# Patient Record
Sex: Female | Born: 1945 | Race: Black or African American | Hispanic: No | Smoking: Former smoker
Health system: Southern US, Community
[De-identification: ages and names within clinical notes are randomized; demographics above are authoritative.]

## PROBLEM LIST (undated history)

## (undated) ENCOUNTER — Emergency Department (HOSPITAL_COMMUNITY): Payer: Medicare HMO

## (undated) DIAGNOSIS — Z862 Personal history of diseases of the blood and blood-forming organs and certain disorders involving the immune mechanism: Secondary | ICD-10-CM

## (undated) DIAGNOSIS — N189 Chronic kidney disease, unspecified: Secondary | ICD-10-CM

## (undated) DIAGNOSIS — D649 Anemia, unspecified: Secondary | ICD-10-CM

## (undated) DIAGNOSIS — Z531 Procedure and treatment not carried out because of patient's decision for reasons of belief and group pressure: Secondary | ICD-10-CM

## (undated) DIAGNOSIS — Z8739 Personal history of other diseases of the musculoskeletal system and connective tissue: Secondary | ICD-10-CM

## (undated) DIAGNOSIS — N186 End stage renal disease: Secondary | ICD-10-CM

## (undated) DIAGNOSIS — F32A Depression, unspecified: Secondary | ICD-10-CM

## (undated) DIAGNOSIS — M4310 Spondylolisthesis, site unspecified: Secondary | ICD-10-CM

## (undated) DIAGNOSIS — IMO0001 Reserved for inherently not codable concepts without codable children: Secondary | ICD-10-CM

## (undated) DIAGNOSIS — J189 Pneumonia, unspecified organism: Secondary | ICD-10-CM

## (undated) DIAGNOSIS — E119 Type 2 diabetes mellitus without complications: Secondary | ICD-10-CM

## (undated) DIAGNOSIS — I10 Essential (primary) hypertension: Secondary | ICD-10-CM

## (undated) DIAGNOSIS — N185 Chronic kidney disease, stage 5: Secondary | ICD-10-CM

## (undated) DIAGNOSIS — M1990 Unspecified osteoarthritis, unspecified site: Secondary | ICD-10-CM

## (undated) DIAGNOSIS — R0600 Dyspnea, unspecified: Secondary | ICD-10-CM

## (undated) DIAGNOSIS — N2581 Secondary hyperparathyroidism of renal origin: Secondary | ICD-10-CM

## (undated) DIAGNOSIS — F329 Major depressive disorder, single episode, unspecified: Secondary | ICD-10-CM

## (undated) DIAGNOSIS — M109 Gout, unspecified: Secondary | ICD-10-CM

## (undated) HISTORY — PX: COLONOSCOPY: SHX174

## (undated) HISTORY — PX: HIP SURGERY: SHX245

## (undated) HISTORY — PX: FRACTURE SURGERY: SHX138

## (undated) HISTORY — PX: COLON SURGERY: SHX602

## (undated) NOTE — *Deleted (*Deleted)
Patient up to bedside commode w / 1 assist . Complaint of contipation . Given prn senna and place dbaack to bed , bed in lowest postion, call bell within reach bed alarm on.

---
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
## 2014-04-19 DIAGNOSIS — D631 Anemia in chronic kidney disease: Secondary | ICD-10-CM | POA: Diagnosis not present

## 2014-04-19 DIAGNOSIS — E1122 Type 2 diabetes mellitus with diabetic chronic kidney disease: Secondary | ICD-10-CM | POA: Diagnosis not present

## 2014-04-19 DIAGNOSIS — I129 Hypertensive chronic kidney disease with stage 1 through stage 4 chronic kidney disease, or unspecified chronic kidney disease: Secondary | ICD-10-CM | POA: Diagnosis not present

## 2014-04-19 DIAGNOSIS — D869 Sarcoidosis, unspecified: Secondary | ICD-10-CM | POA: Diagnosis not present

## 2014-04-19 DIAGNOSIS — I951 Orthostatic hypotension: Secondary | ICD-10-CM | POA: Diagnosis not present

## 2014-04-19 DIAGNOSIS — N184 Chronic kidney disease, stage 4 (severe): Secondary | ICD-10-CM | POA: Diagnosis not present

## 2014-04-19 DIAGNOSIS — N179 Acute kidney failure, unspecified: Secondary | ICD-10-CM | POA: Diagnosis not present

## 2014-04-19 DIAGNOSIS — I10 Essential (primary) hypertension: Secondary | ICD-10-CM | POA: Diagnosis not present

## 2014-04-19 DIAGNOSIS — E782 Mixed hyperlipidemia: Secondary | ICD-10-CM | POA: Diagnosis not present

## 2014-04-19 DIAGNOSIS — M109 Gout, unspecified: Secondary | ICD-10-CM | POA: Diagnosis not present

## 2014-09-12 DIAGNOSIS — G8929 Other chronic pain: Secondary | ICD-10-CM | POA: Diagnosis not present

## 2014-09-12 DIAGNOSIS — I10 Essential (primary) hypertension: Secondary | ICD-10-CM | POA: Diagnosis not present

## 2014-09-12 DIAGNOSIS — Z1389 Encounter for screening for other disorder: Secondary | ICD-10-CM | POA: Diagnosis not present

## 2014-09-12 DIAGNOSIS — M549 Dorsalgia, unspecified: Secondary | ICD-10-CM | POA: Diagnosis not present

## 2014-09-12 DIAGNOSIS — Z0000 Encounter for general adult medical examination without abnormal findings: Secondary | ICD-10-CM | POA: Diagnosis not present

## 2014-10-10 DIAGNOSIS — F1120 Opioid dependence, uncomplicated: Secondary | ICD-10-CM | POA: Diagnosis not present

## 2014-10-10 DIAGNOSIS — M545 Low back pain: Secondary | ICD-10-CM | POA: Diagnosis not present

## 2014-10-10 DIAGNOSIS — G894 Chronic pain syndrome: Secondary | ICD-10-CM | POA: Diagnosis not present

## 2014-10-10 DIAGNOSIS — M47816 Spondylosis without myelopathy or radiculopathy, lumbar region: Secondary | ICD-10-CM | POA: Diagnosis not present

## 2014-10-10 DIAGNOSIS — M533 Sacrococcygeal disorders, not elsewhere classified: Secondary | ICD-10-CM | POA: Diagnosis not present

## 2014-10-17 DIAGNOSIS — E780 Pure hypercholesterolemia: Secondary | ICD-10-CM | POA: Diagnosis not present

## 2014-10-17 DIAGNOSIS — Z79899 Other long term (current) drug therapy: Secondary | ICD-10-CM | POA: Diagnosis not present

## 2014-10-24 DIAGNOSIS — Z9181 History of falling: Secondary | ICD-10-CM | POA: Diagnosis not present

## 2014-10-24 DIAGNOSIS — E780 Pure hypercholesterolemia: Secondary | ICD-10-CM | POA: Diagnosis not present

## 2014-10-24 DIAGNOSIS — E1143 Type 2 diabetes mellitus with diabetic autonomic (poly)neuropathy: Secondary | ICD-10-CM | POA: Diagnosis not present

## 2014-10-24 DIAGNOSIS — N189 Chronic kidney disease, unspecified: Secondary | ICD-10-CM | POA: Diagnosis not present

## 2014-10-25 DIAGNOSIS — G894 Chronic pain syndrome: Secondary | ICD-10-CM | POA: Diagnosis not present

## 2014-10-25 DIAGNOSIS — M47816 Spondylosis without myelopathy or radiculopathy, lumbar region: Secondary | ICD-10-CM | POA: Diagnosis not present

## 2014-10-25 DIAGNOSIS — F1120 Opioid dependence, uncomplicated: Secondary | ICD-10-CM | POA: Diagnosis not present

## 2014-10-25 DIAGNOSIS — M533 Sacrococcygeal disorders, not elsewhere classified: Secondary | ICD-10-CM | POA: Diagnosis not present

## 2014-11-15 DIAGNOSIS — M461 Sacroiliitis, not elsewhere classified: Secondary | ICD-10-CM | POA: Diagnosis not present

## 2014-11-15 DIAGNOSIS — M47816 Spondylosis without myelopathy or radiculopathy, lumbar region: Secondary | ICD-10-CM | POA: Diagnosis not present

## 2014-11-15 DIAGNOSIS — M533 Sacrococcygeal disorders, not elsewhere classified: Secondary | ICD-10-CM | POA: Diagnosis not present

## 2014-11-15 DIAGNOSIS — G894 Chronic pain syndrome: Secondary | ICD-10-CM | POA: Diagnosis not present

## 2014-11-15 DIAGNOSIS — F1120 Opioid dependence, uncomplicated: Secondary | ICD-10-CM | POA: Diagnosis not present

## 2014-12-13 DIAGNOSIS — F1120 Opioid dependence, uncomplicated: Secondary | ICD-10-CM | POA: Diagnosis not present

## 2014-12-13 DIAGNOSIS — M533 Sacrococcygeal disorders, not elsewhere classified: Secondary | ICD-10-CM | POA: Diagnosis not present

## 2014-12-13 DIAGNOSIS — M47816 Spondylosis without myelopathy or radiculopathy, lumbar region: Secondary | ICD-10-CM | POA: Diagnosis not present

## 2014-12-13 DIAGNOSIS — G894 Chronic pain syndrome: Secondary | ICD-10-CM | POA: Diagnosis not present

## 2015-01-08 DIAGNOSIS — H26493 Other secondary cataract, bilateral: Secondary | ICD-10-CM | POA: Diagnosis not present

## 2015-01-08 DIAGNOSIS — E119 Type 2 diabetes mellitus without complications: Secondary | ICD-10-CM | POA: Diagnosis not present

## 2015-01-08 DIAGNOSIS — H524 Presbyopia: Secondary | ICD-10-CM | POA: Diagnosis not present

## 2015-01-16 DIAGNOSIS — M546 Pain in thoracic spine: Secondary | ICD-10-CM | POA: Diagnosis not present

## 2015-01-16 DIAGNOSIS — I10 Essential (primary) hypertension: Secondary | ICD-10-CM | POA: Diagnosis not present

## 2015-01-16 DIAGNOSIS — G8929 Other chronic pain: Secondary | ICD-10-CM | POA: Diagnosis not present

## 2015-01-16 DIAGNOSIS — E1165 Type 2 diabetes mellitus with hyperglycemia: Secondary | ICD-10-CM | POA: Diagnosis not present

## 2015-04-04 DIAGNOSIS — E1165 Type 2 diabetes mellitus with hyperglycemia: Secondary | ICD-10-CM | POA: Diagnosis not present

## 2015-04-09 DIAGNOSIS — E119 Type 2 diabetes mellitus without complications: Secondary | ICD-10-CM | POA: Diagnosis not present

## 2015-04-09 DIAGNOSIS — E780 Pure hypercholesterolemia: Secondary | ICD-10-CM | POA: Diagnosis not present

## 2015-04-09 DIAGNOSIS — I10 Essential (primary) hypertension: Secondary | ICD-10-CM | POA: Diagnosis not present

## 2015-04-09 DIAGNOSIS — N189 Chronic kidney disease, unspecified: Secondary | ICD-10-CM | POA: Diagnosis not present

## 2015-04-16 DIAGNOSIS — N390 Urinary tract infection, site not specified: Secondary | ICD-10-CM | POA: Diagnosis not present

## 2015-04-17 DIAGNOSIS — N183 Chronic kidney disease, stage 3 (moderate): Secondary | ICD-10-CM | POA: Diagnosis not present

## 2015-04-17 DIAGNOSIS — N184 Chronic kidney disease, stage 4 (severe): Secondary | ICD-10-CM | POA: Diagnosis not present

## 2015-04-17 DIAGNOSIS — N2581 Secondary hyperparathyroidism of renal origin: Secondary | ICD-10-CM | POA: Diagnosis not present

## 2015-05-08 DIAGNOSIS — N189 Chronic kidney disease, unspecified: Secondary | ICD-10-CM | POA: Diagnosis not present

## 2015-05-08 DIAGNOSIS — N2581 Secondary hyperparathyroidism of renal origin: Secondary | ICD-10-CM | POA: Diagnosis not present

## 2015-05-08 DIAGNOSIS — N184 Chronic kidney disease, stage 4 (severe): Secondary | ICD-10-CM | POA: Diagnosis not present

## 2015-05-08 DIAGNOSIS — R938 Abnormal findings on diagnostic imaging of other specified body structures: Secondary | ICD-10-CM | POA: Diagnosis not present

## 2015-05-18 DIAGNOSIS — N2581 Secondary hyperparathyroidism of renal origin: Secondary | ICD-10-CM | POA: Diagnosis not present

## 2015-05-18 DIAGNOSIS — N184 Chronic kidney disease, stage 4 (severe): Secondary | ICD-10-CM | POA: Diagnosis not present

## 2015-05-30 DIAGNOSIS — D4102 Neoplasm of uncertain behavior of left kidney: Secondary | ICD-10-CM | POA: Diagnosis not present

## 2015-06-05 DIAGNOSIS — R05 Cough: Secondary | ICD-10-CM | POA: Diagnosis not present

## 2015-06-05 DIAGNOSIS — R062 Wheezing: Secondary | ICD-10-CM | POA: Diagnosis not present

## 2015-06-05 DIAGNOSIS — J069 Acute upper respiratory infection, unspecified: Secondary | ICD-10-CM | POA: Diagnosis not present

## 2015-06-05 DIAGNOSIS — R0600 Dyspnea, unspecified: Secondary | ICD-10-CM | POA: Diagnosis not present

## 2015-06-19 DIAGNOSIS — I10 Essential (primary) hypertension: Secondary | ICD-10-CM | POA: Diagnosis not present

## 2015-06-19 DIAGNOSIS — J302 Other seasonal allergic rhinitis: Secondary | ICD-10-CM | POA: Diagnosis not present

## 2015-06-19 DIAGNOSIS — M109 Gout, unspecified: Secondary | ICD-10-CM | POA: Diagnosis not present

## 2015-07-19 DIAGNOSIS — M109 Gout, unspecified: Secondary | ICD-10-CM | POA: Diagnosis not present

## 2015-07-19 DIAGNOSIS — E1165 Type 2 diabetes mellitus with hyperglycemia: Secondary | ICD-10-CM | POA: Diagnosis not present

## 2015-07-19 DIAGNOSIS — N189 Chronic kidney disease, unspecified: Secondary | ICD-10-CM | POA: Diagnosis not present

## 2015-07-19 DIAGNOSIS — I10 Essential (primary) hypertension: Secondary | ICD-10-CM | POA: Diagnosis not present

## 2015-08-16 DIAGNOSIS — N2581 Secondary hyperparathyroidism of renal origin: Secondary | ICD-10-CM | POA: Diagnosis not present

## 2015-08-16 DIAGNOSIS — N183 Chronic kidney disease, stage 3 (moderate): Secondary | ICD-10-CM | POA: Diagnosis not present

## 2015-08-16 DIAGNOSIS — N184 Chronic kidney disease, stage 4 (severe): Secondary | ICD-10-CM | POA: Diagnosis not present

## 2015-11-20 DIAGNOSIS — N183 Chronic kidney disease, stage 3 (moderate): Secondary | ICD-10-CM | POA: Diagnosis not present

## 2015-11-20 DIAGNOSIS — N2581 Secondary hyperparathyroidism of renal origin: Secondary | ICD-10-CM | POA: Diagnosis not present

## 2015-11-20 DIAGNOSIS — N184 Chronic kidney disease, stage 4 (severe): Secondary | ICD-10-CM | POA: Diagnosis not present

## 2015-11-30 ENCOUNTER — Other Ambulatory Visit: Payer: Self-pay | Admitting: Vascular Surgery

## 2015-11-30 DIAGNOSIS — N184 Chronic kidney disease, stage 4 (severe): Secondary | ICD-10-CM

## 2015-11-30 DIAGNOSIS — Z01810 Encounter for preprocedural cardiovascular examination: Secondary | ICD-10-CM

## 2015-12-04 ENCOUNTER — Encounter: Payer: Self-pay | Admitting: Vascular Surgery

## 2015-12-10 ENCOUNTER — Ambulatory Visit (INDEPENDENT_AMBULATORY_CARE_PROVIDER_SITE_OTHER): Payer: Commercial Managed Care - HMO | Admitting: Vascular Surgery

## 2015-12-10 ENCOUNTER — Ambulatory Visit (HOSPITAL_COMMUNITY)
Admission: RE | Admit: 2015-12-10 | Discharge: 2015-12-10 | Disposition: A | Discharge status: Home / Self Care | Payer: Commercial Managed Care - HMO | Source: Ambulatory Visit | Attending: Vascular Surgery | Admitting: Vascular Surgery

## 2015-12-10 ENCOUNTER — Encounter: Payer: Self-pay | Admitting: Vascular Surgery

## 2015-12-10 VITALS — BP 160/107 | HR 76 | Temp 98.0°F | Resp 16 | Ht 67.0 in | Wt 211.0 lb

## 2015-12-10 DIAGNOSIS — Z01810 Encounter for preprocedural cardiovascular examination: Secondary | ICD-10-CM | POA: Diagnosis not present

## 2015-12-10 DIAGNOSIS — N184 Chronic kidney disease, stage 4 (severe): Secondary | ICD-10-CM

## 2015-12-10 DIAGNOSIS — Z992 Dependence on renal dialysis: Secondary | ICD-10-CM | POA: Insufficient documentation

## 2015-12-10 NOTE — Progress Notes (Signed)
History of Present Illness:  Patient is a 70 y.o. year old female who presents for placement of a permanent hemodialysis access. The patient is right handed.  The patient is not currently on hemodialysis.  The cause of renal failure is thought to be secondary to DM managed with insulin, HTN managed with Labetalol, hypercholesterolemia managed with Lovastatinand sarcodosis.  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  Pt denies PPM or CVC.    Past Medical History: 1. DM 2. Gout 3. CKD Stage 3 4. HTN 5. HLD 6. Secondary HPT 7. Obesity 8. Sarcoidosis  Past Surgical History: polypectomy  Social History Social History  Substance Use Topics  . Smoking status: Former Smoker    Quit date: 1973  . Smokeless tobacco: Never Used  . Alcohol use Not on file    Family History Unknown to patient as adopted  Allergies NKDA   Current Outpatient Prescriptions  Medication Sig Dispense Refill  . Ascorbic Acid (VITAMIN C) 1000 MG tablet Take 1,000 mg by mouth daily.    . Biotin (BIOTIN 5000) 5 MG CAPS Take by mouth.    . citalopram (CELEXA) 10 MG tablet Take 10 mg by mouth daily.    . DULoxetine (CYMBALTA) 30 MG capsule Take 30 mg by mouth daily.    . febuxostat (ULORIC) 40 MG tablet Take 40 mg by mouth daily.    . folic acid (FOLVITE) 034 MCG tablet Take 400 mcg by mouth daily.    . furosemide (LASIX) 40 MG tablet Take 40 mg by mouth.    . insulin detemir (LEVEMIR) 100 UNIT/ML injection Inject into the skin at bedtime.    Marland Kitchen labetalol (NORMODYNE) 200 MG tablet Take 200 mg by mouth 2 (two) times daily.    Marland Kitchen lovastatin (MEVACOR) 40 MG tablet Take 40 mg by mouth at bedtime.    . metoprolol (LOPRESSOR) 50 MG tablet Take 50 mg by mouth 2 (two) times daily.    Marland Kitchen NIFEdipine (PROCARDIA-XL/ADALAT CC) 30 MG 24 hr tablet Take 30 mg by mouth daily.    . Omega-3 Fatty Acids (FISH OIL) 1000 MG CAPS Take by mouth.    . potassium chloride (KLOR-CON) 20 MEQ packet Take by mouth 2 (two) times daily.      . sodium bicarbonate 650 MG tablet Take 650 mg by mouth 4 (four) times daily.    . Turmeric 500 MG CAPS Take by mouth.     No current facility-administered medications for this visit.     ROS:   General:  No weight loss, Fever, chills  HEENT: No recent headaches, no nasal bleeding, no visual changes, no sore throat  Neurologic: No dizziness, blackouts, seizures. No recent symptoms of stroke or mini- stroke. No recent episodes of slurred speech, or temporary blindness.  Cardiac: No recent episodes of chest pain/pressure, no shortness of breath at rest.  No shortness of breath with exertion.  Denies history of atrial fibrillation or irregular heartbeat  Vascular: No history of rest pain in feet.  No history of claudication.  No history of non-healing ulcer, No history of DVT   Pulmonary: No home oxygen, no productive cough, no hemoptysis,  No asthma or wheezing  Musculoskeletal:  [x ] Arthritis, [ ]  Low back pain,  [ ]  Joint pain  Hematologic:No history of hypercoagulable state.  No history of easy bleeding.  No history of anemia  Gastrointestinal: No hematochezia or melena,  No gastroesophageal reflux, no trouble swallowing  Urinary: [x ] chronic Kidney  disease, [ ]  on HD - [ ]  MWF or [ ]  TTHS, [ ]  Burning with urination, [ ]  Frequent urination, [ ]  Difficulty urinating;   Skin: No rashes  Psychological: No history of anxiety,  No history of depression   Physical Examination  Vitals:   12/10/15 1401 12/10/15 1403  BP: (!) 152/99 (!) 160/107  Pulse: 76   Resp: 16   Temp: 98 F (36.7 C)   TempSrc: Oral   SpO2: 100%   Weight: 211 lb (95.7 kg)   Height: 5\' 7"  (1.702 m)     Body mass index is 33.05 kg/m.  General:  Alert and oriented, no acute distress HEENT: Normal Neck: No bruit or JVD Pulmonary: Clear to auscultation bilaterally Cardiac: Regular Rate and Rhythm without murmur Gastrointestinal: Soft, non-tender, non-distended, no mass, no scars Skin: No  rash Extremity Pulses:  2+ radial, brachial pulses bilaterally Musculoskeletal: No deformity or edema  Neurologic: Upper and lower extremity motor 5/5 and symmetric Psychiatric: Judgment intact, Mood & affect appropriate for pt's clinical situation Lymph : No Cervical, Axillary, or Inguinal lymphadenopathy    DATA:  Arterial duplex B UE triphasic flow   Vein mapping Reasonable right and  left cephalic above the antecubital fossa, as well as basilic bilaterally     ASSESSMENT:  CKD stage IV not on HD [employer] wtness   PLAN: When she decides she will be a good candidate for left brachiocephalic av fistula.  She is right hand dominant.  Dr. Bridgett Larsson discussed the risks and benefits of surgery.  She is considering he options for surgery.  Theda Sers, Labron Bloodgood MAUREEN PA-C [facility]  The patient was seen in conjunction with Dr. Bridgett Larsson today  Addendum  I have independently interviewed and examined the patient, and I agree with the physician assistant's findings.  Pt still considering whether or not to consider dialysis.  Based on vein mapping and exam, I would offer pt a L BC AVF if she elects to proceed with hemodialysis.  She will contact us if she wishes to proceed.   Risk, benefits, and alternatives to access surgery were discussed.    The patient is aware the risks include but are not limited to: bleeding, infection, steal syndrome, nerve damage, ischemic monomelic neuropathy, failure to mature, need for additional procedures, death and stroke.    Adele Barthel, MD, FACS [facility] [place] Office: [phone] Pager: [phone]  12/10/2015, 2:49 PM

## 2015-12-26 DIAGNOSIS — M109 Gout, unspecified: Secondary | ICD-10-CM | POA: Diagnosis not present

## 2015-12-26 DIAGNOSIS — E1165 Type 2 diabetes mellitus with hyperglycemia: Secondary | ICD-10-CM | POA: Diagnosis not present

## 2016-01-01 DIAGNOSIS — Z23 Encounter for immunization: Secondary | ICD-10-CM | POA: Diagnosis not present

## 2016-01-01 DIAGNOSIS — M109 Gout, unspecified: Secondary | ICD-10-CM | POA: Diagnosis not present

## 2016-01-01 DIAGNOSIS — Z0000 Encounter for general adult medical examination without abnormal findings: Secondary | ICD-10-CM | POA: Diagnosis not present

## 2016-01-01 DIAGNOSIS — Z2821 Immunization not carried out because of patient refusal: Secondary | ICD-10-CM | POA: Diagnosis not present

## 2016-01-01 DIAGNOSIS — E7800 Pure hypercholesterolemia, unspecified: Secondary | ICD-10-CM | POA: Diagnosis not present

## 2016-01-01 DIAGNOSIS — Z9181 History of falling: Secondary | ICD-10-CM | POA: Diagnosis not present

## 2016-01-01 DIAGNOSIS — E1165 Type 2 diabetes mellitus with hyperglycemia: Secondary | ICD-10-CM | POA: Diagnosis not present

## 2016-01-14 DIAGNOSIS — E119 Type 2 diabetes mellitus without complications: Secondary | ICD-10-CM | POA: Diagnosis not present

## 2016-01-14 DIAGNOSIS — H26493 Other secondary cataract, bilateral: Secondary | ICD-10-CM | POA: Diagnosis not present

## 2016-01-14 DIAGNOSIS — H524 Presbyopia: Secondary | ICD-10-CM | POA: Diagnosis not present

## 2016-04-01 DIAGNOSIS — E1129 Type 2 diabetes mellitus with other diabetic kidney complication: Secondary | ICD-10-CM | POA: Diagnosis not present

## 2016-04-01 DIAGNOSIS — D519 Vitamin B12 deficiency anemia, unspecified: Secondary | ICD-10-CM | POA: Diagnosis not present

## 2016-04-01 DIAGNOSIS — N189 Chronic kidney disease, unspecified: Secondary | ICD-10-CM | POA: Diagnosis not present

## 2016-04-01 DIAGNOSIS — E785 Hyperlipidemia, unspecified: Secondary | ICD-10-CM | POA: Diagnosis not present

## 2016-04-01 DIAGNOSIS — G252 Other specified forms of tremor: Secondary | ICD-10-CM | POA: Diagnosis not present

## 2016-04-01 DIAGNOSIS — Z79899 Other long term (current) drug therapy: Secondary | ICD-10-CM | POA: Diagnosis not present

## 2016-04-01 DIAGNOSIS — Z2821 Immunization not carried out because of patient refusal: Secondary | ICD-10-CM | POA: Diagnosis not present

## 2016-04-01 DIAGNOSIS — Z1389 Encounter for screening for other disorder: Secondary | ICD-10-CM | POA: Diagnosis not present

## 2016-04-08 DIAGNOSIS — N184 Chronic kidney disease, stage 4 (severe): Secondary | ICD-10-CM | POA: Diagnosis not present

## 2016-04-08 DIAGNOSIS — N183 Chronic kidney disease, stage 3 (moderate): Secondary | ICD-10-CM | POA: Diagnosis not present

## 2016-04-08 DIAGNOSIS — N2581 Secondary hyperparathyroidism of renal origin: Secondary | ICD-10-CM | POA: Diagnosis not present

## 2016-04-08 DIAGNOSIS — E669 Obesity, unspecified: Secondary | ICD-10-CM | POA: Diagnosis not present

## 2016-05-11 DIAGNOSIS — J209 Acute bronchitis, unspecified: Secondary | ICD-10-CM | POA: Diagnosis not present

## 2016-05-11 DIAGNOSIS — J111 Influenza due to unidentified influenza virus with other respiratory manifestations: Secondary | ICD-10-CM | POA: Diagnosis not present

## 2016-07-15 DIAGNOSIS — E1165 Type 2 diabetes mellitus with hyperglycemia: Secondary | ICD-10-CM | POA: Diagnosis not present

## 2016-07-22 DIAGNOSIS — J302 Other seasonal allergic rhinitis: Secondary | ICD-10-CM | POA: Diagnosis not present

## 2016-07-22 DIAGNOSIS — E7800 Pure hypercholesterolemia, unspecified: Secondary | ICD-10-CM | POA: Diagnosis not present

## 2016-07-22 DIAGNOSIS — I10 Essential (primary) hypertension: Secondary | ICD-10-CM | POA: Diagnosis not present

## 2016-07-22 DIAGNOSIS — D649 Anemia, unspecified: Secondary | ICD-10-CM | POA: Diagnosis not present

## 2016-07-22 DIAGNOSIS — E7139 Other disorders of fatty-acid metabolism: Secondary | ICD-10-CM | POA: Diagnosis not present

## 2016-07-22 DIAGNOSIS — Z6832 Body mass index (BMI) 32.0-32.9, adult: Secondary | ICD-10-CM | POA: Diagnosis not present

## 2016-08-15 DIAGNOSIS — E669 Obesity, unspecified: Secondary | ICD-10-CM | POA: Diagnosis not present

## 2016-08-15 DIAGNOSIS — N184 Chronic kidney disease, stage 4 (severe): Secondary | ICD-10-CM | POA: Diagnosis not present

## 2016-08-15 DIAGNOSIS — N2581 Secondary hyperparathyroidism of renal origin: Secondary | ICD-10-CM | POA: Diagnosis not present

## 2016-08-15 DIAGNOSIS — N183 Chronic kidney disease, stage 3 (moderate): Secondary | ICD-10-CM | POA: Diagnosis not present

## 2017-03-02 DIAGNOSIS — R05 Cough: Secondary | ICD-10-CM | POA: Diagnosis not present

## 2017-03-02 DIAGNOSIS — R0602 Shortness of breath: Secondary | ICD-10-CM | POA: Diagnosis not present

## 2017-03-02 DIAGNOSIS — R0600 Dyspnea, unspecified: Secondary | ICD-10-CM | POA: Diagnosis not present

## 2017-03-17 DIAGNOSIS — Z0000 Encounter for general adult medical examination without abnormal findings: Secondary | ICD-10-CM | POA: Diagnosis not present

## 2017-03-17 DIAGNOSIS — D519 Vitamin B12 deficiency anemia, unspecified: Secondary | ICD-10-CM | POA: Diagnosis not present

## 2017-03-17 DIAGNOSIS — Z6831 Body mass index (BMI) 31.0-31.9, adult: Secondary | ICD-10-CM | POA: Diagnosis not present

## 2017-03-17 DIAGNOSIS — G542 Cervical root disorders, not elsewhere classified: Secondary | ICD-10-CM | POA: Diagnosis not present

## 2017-03-17 DIAGNOSIS — G8929 Other chronic pain: Secondary | ICD-10-CM | POA: Diagnosis not present

## 2017-03-17 DIAGNOSIS — M109 Gout, unspecified: Secondary | ICD-10-CM | POA: Diagnosis not present

## 2017-03-17 DIAGNOSIS — Z79899 Other long term (current) drug therapy: Secondary | ICD-10-CM | POA: Diagnosis not present

## 2017-03-17 DIAGNOSIS — E7439 Other disorders of intestinal carbohydrate absorption: Secondary | ICD-10-CM | POA: Diagnosis not present

## 2017-03-17 DIAGNOSIS — N189 Chronic kidney disease, unspecified: Secondary | ICD-10-CM | POA: Diagnosis not present

## 2017-03-25 DIAGNOSIS — N2581 Secondary hyperparathyroidism of renal origin: Secondary | ICD-10-CM | POA: Diagnosis not present

## 2017-03-25 DIAGNOSIS — N184 Chronic kidney disease, stage 4 (severe): Secondary | ICD-10-CM | POA: Diagnosis not present

## 2017-03-25 DIAGNOSIS — N183 Chronic kidney disease, stage 3 (moderate): Secondary | ICD-10-CM | POA: Diagnosis not present

## 2017-03-25 DIAGNOSIS — E669 Obesity, unspecified: Secondary | ICD-10-CM | POA: Diagnosis not present

## 2017-04-20 DIAGNOSIS — E119 Type 2 diabetes mellitus without complications: Secondary | ICD-10-CM | POA: Diagnosis not present

## 2017-04-20 DIAGNOSIS — H26493 Other secondary cataract, bilateral: Secondary | ICD-10-CM | POA: Diagnosis not present

## 2017-05-06 DIAGNOSIS — N184 Chronic kidney disease, stage 4 (severe): Secondary | ICD-10-CM | POA: Diagnosis not present

## 2017-05-06 DIAGNOSIS — M5136 Other intervertebral disc degeneration, lumbar region: Secondary | ICD-10-CM | POA: Diagnosis not present

## 2017-05-06 DIAGNOSIS — M810 Age-related osteoporosis without current pathological fracture: Secondary | ICD-10-CM | POA: Diagnosis not present

## 2017-05-06 DIAGNOSIS — M1A39X0 Chronic gout due to renal impairment, multiple sites, without tophus (tophi): Secondary | ICD-10-CM | POA: Diagnosis not present

## 2017-05-06 DIAGNOSIS — Z23 Encounter for immunization: Secondary | ICD-10-CM | POA: Diagnosis not present

## 2017-05-06 DIAGNOSIS — E1122 Type 2 diabetes mellitus with diabetic chronic kidney disease: Secondary | ICD-10-CM | POA: Diagnosis not present

## 2018-08-20 ENCOUNTER — Other Ambulatory Visit: Payer: Self-pay

## 2018-08-20 ENCOUNTER — Encounter: Payer: Self-pay | Admitting: Emergency Medicine

## 2018-08-20 ENCOUNTER — Emergency Department (HOSPITAL_COMMUNITY): Payer: Medicare HMO

## 2018-08-20 ENCOUNTER — Inpatient Hospital Stay (HOSPITAL_COMMUNITY)
Admission: EM | Admit: 2018-08-20 | Discharge: 2018-08-27 | DRG: 674 | Disposition: A | Discharge status: Home / Self Care | Payer: Medicare HMO | Attending: Internal Medicine | Admitting: Internal Medicine

## 2018-08-20 DIAGNOSIS — D638 Anemia in other chronic diseases classified elsewhere: Secondary | ICD-10-CM | POA: Diagnosis not present

## 2018-08-20 DIAGNOSIS — IMO0001 Reserved for inherently not codable concepts without codable children: Secondary | ICD-10-CM | POA: Diagnosis present

## 2018-08-20 DIAGNOSIS — D631 Anemia in chronic kidney disease: Secondary | ICD-10-CM | POA: Diagnosis present

## 2018-08-20 DIAGNOSIS — Z79899 Other long term (current) drug therapy: Secondary | ICD-10-CM | POA: Diagnosis not present

## 2018-08-20 DIAGNOSIS — M109 Gout, unspecified: Secondary | ICD-10-CM | POA: Diagnosis present

## 2018-08-20 DIAGNOSIS — E8889 Other specified metabolic disorders: Secondary | ICD-10-CM | POA: Diagnosis present

## 2018-08-20 DIAGNOSIS — N185 Chronic kidney disease, stage 5: Secondary | ICD-10-CM | POA: Diagnosis present

## 2018-08-20 DIAGNOSIS — F32A Depression, unspecified: Secondary | ICD-10-CM | POA: Diagnosis present

## 2018-08-20 DIAGNOSIS — I10 Essential (primary) hypertension: Secondary | ICD-10-CM

## 2018-08-20 DIAGNOSIS — R7989 Other specified abnormal findings of blood chemistry: Secondary | ICD-10-CM

## 2018-08-20 DIAGNOSIS — D649 Anemia, unspecified: Secondary | ICD-10-CM | POA: Diagnosis not present

## 2018-08-20 DIAGNOSIS — Z531 Procedure and treatment not carried out because of patient's decision for reasons of belief and group pressure: Secondary | ICD-10-CM | POA: Diagnosis present

## 2018-08-20 DIAGNOSIS — Z992 Dependence on renal dialysis: Secondary | ICD-10-CM

## 2018-08-20 DIAGNOSIS — F419 Anxiety disorder, unspecified: Secondary | ICD-10-CM | POA: Diagnosis present

## 2018-08-20 DIAGNOSIS — Z20828 Contact with and (suspected) exposure to other viral communicable diseases: Secondary | ICD-10-CM | POA: Diagnosis present

## 2018-08-20 DIAGNOSIS — F329 Major depressive disorder, single episode, unspecified: Secondary | ICD-10-CM | POA: Diagnosis present

## 2018-08-20 DIAGNOSIS — R55 Syncope and collapse: Secondary | ICD-10-CM | POA: Diagnosis present

## 2018-08-20 DIAGNOSIS — I120 Hypertensive chronic kidney disease with stage 5 chronic kidney disease or end stage renal disease: Principal | ICD-10-CM | POA: Diagnosis present

## 2018-08-20 DIAGNOSIS — N186 End stage renal disease: Secondary | ICD-10-CM

## 2018-08-20 DIAGNOSIS — N179 Acute kidney failure, unspecified: Secondary | ICD-10-CM | POA: Diagnosis present

## 2018-08-20 DIAGNOSIS — E785 Hyperlipidemia, unspecified: Secondary | ICD-10-CM | POA: Diagnosis present

## 2018-08-20 DIAGNOSIS — Z87891 Personal history of nicotine dependence: Secondary | ICD-10-CM

## 2018-08-20 DIAGNOSIS — Z789 Other specified health status: Secondary | ICD-10-CM | POA: Diagnosis present

## 2018-08-20 DIAGNOSIS — E872 Acidosis: Secondary | ICD-10-CM | POA: Diagnosis present

## 2018-08-20 DIAGNOSIS — Z01810 Encounter for preprocedural cardiovascular examination: Secondary | ICD-10-CM | POA: Diagnosis not present

## 2018-08-20 HISTORY — DX: Personal history of other diseases of the musculoskeletal system and connective tissue: Z87.39

## 2018-08-20 HISTORY — DX: Essential (primary) hypertension: I10

## 2018-08-20 HISTORY — DX: Type 2 diabetes mellitus without complications: E11.9

## 2018-08-20 HISTORY — DX: Gout, unspecified: M10.9

## 2018-08-20 HISTORY — DX: Chronic kidney disease, stage 5: N18.5

## 2018-08-20 HISTORY — DX: Chronic kidney disease, unspecified: N18.9

## 2018-08-20 HISTORY — DX: Spondylolisthesis, site unspecified: M43.10

## 2018-08-20 HISTORY — DX: Chronic kidney disease, unspecified: Z86.2

## 2018-08-20 HISTORY — DX: Secondary hyperparathyroidism of renal origin: N25.81

## 2018-08-20 LAB — CBC WITH DIFFERENTIAL/PLATELET
Abs Immature Granulocytes: 0.02 10*3/uL (ref 0.00–0.07)
Basophils Absolute: 0 10*3/uL (ref 0.0–0.1)
Basophils Relative: 0 %
Eosinophils Absolute: 0.2 10*3/uL (ref 0.0–0.5)
Eosinophils Relative: 3 %
HCT: 22.8 % — ABNORMAL LOW (ref 36.0–46.0)
Hemoglobin: 7 g/dL — ABNORMAL LOW (ref 12.0–15.0)
Immature Granulocytes: 0 %
Lymphocytes Relative: 13 %
Lymphs Abs: 0.9 10*3/uL (ref 0.7–4.0)
MCH: 27 pg (ref 26.0–34.0)
MCHC: 30.7 g/dL (ref 30.0–36.0)
MCV: 88 fL (ref 80.0–100.0)
Monocytes Absolute: 0.4 10*3/uL (ref 0.1–1.0)
Monocytes Relative: 5 %
Neutro Abs: 5.5 10*3/uL (ref 1.7–7.7)
Neutrophils Relative %: 79 %
Platelets: 350 10*3/uL (ref 150–400)
RBC: 2.59 MIL/uL — ABNORMAL LOW (ref 3.87–5.11)
RDW: 17.5 % — ABNORMAL HIGH (ref 11.5–15.5)
WBC: 7 10*3/uL (ref 4.0–10.5)
nRBC: 0 % (ref 0.0–0.2)

## 2018-08-20 LAB — COMPREHENSIVE METABOLIC PANEL
ALT: 10 U/L (ref 0–44)
AST: 9 U/L — ABNORMAL LOW (ref 15–41)
Albumin: 3.3 g/dL — ABNORMAL LOW (ref 3.5–5.0)
Alkaline Phosphatase: 47 U/L (ref 38–126)
Anion gap: 18 — ABNORMAL HIGH (ref 5–15)
BUN: 138 mg/dL — ABNORMAL HIGH (ref 8–23)
CO2: 18 mmol/L — ABNORMAL LOW (ref 22–32)
Calcium: 9.2 mg/dL (ref 8.9–10.3)
Chloride: 98 mmol/L (ref 98–111)
Creatinine, Ser: 18.8 mg/dL — ABNORMAL HIGH (ref 0.44–1.00)
GFR calc Af Amer: 2 mL/min — ABNORMAL LOW (ref >60)
GFR calc non Af Amer: 2 mL/min — ABNORMAL LOW (ref >60)
Glucose, Bld: 165 mg/dL — ABNORMAL HIGH (ref 70–99)
Potassium: 4.8 mmol/L (ref 3.5–5.1)
Sodium: 134 mmol/L — ABNORMAL LOW (ref 135–145)
Total Bilirubin: 0.8 mg/dL (ref 0.3–1.2)
Total Protein: 6.3 g/dL — ABNORMAL LOW (ref 6.5–8.1)

## 2018-08-20 LAB — URINALYSIS, ROUTINE W REFLEX MICROSCOPIC
Bilirubin Urine: NEGATIVE
Glucose, UA: NEGATIVE mg/dL
Ketones, ur: NEGATIVE mg/dL
Nitrite: NEGATIVE
Protein, ur: 100 mg/dL — AB
Specific Gravity, Urine: 1.012 (ref 1.005–1.030)
WBC, UA: >50 WBC/hpf — ABNORMAL HIGH (ref 0–5)
pH: 5 (ref 5.0–8.0)

## 2018-08-20 LAB — SARS CORONAVIRUS 2: SARS Coronavirus 2: NOT DETECTED

## 2018-08-20 LAB — TROPONIN I
Troponin I: 0.04 ng/mL (ref <0.03)
Troponin I: 0.03 ng/mL (ref <0.03)

## 2018-08-20 LAB — POC OCCULT BLOOD, ED: Fecal Occult Bld: NEGATIVE

## 2018-08-20 IMAGING — CR CHEST - 2 VIEW
2 series · 2 of 2 positions shown · non-contrast
Comparison: [DATE]

CLINICAL DATA: Syncope

EXAM:
CHEST - 2 VIEW

[chest ap]
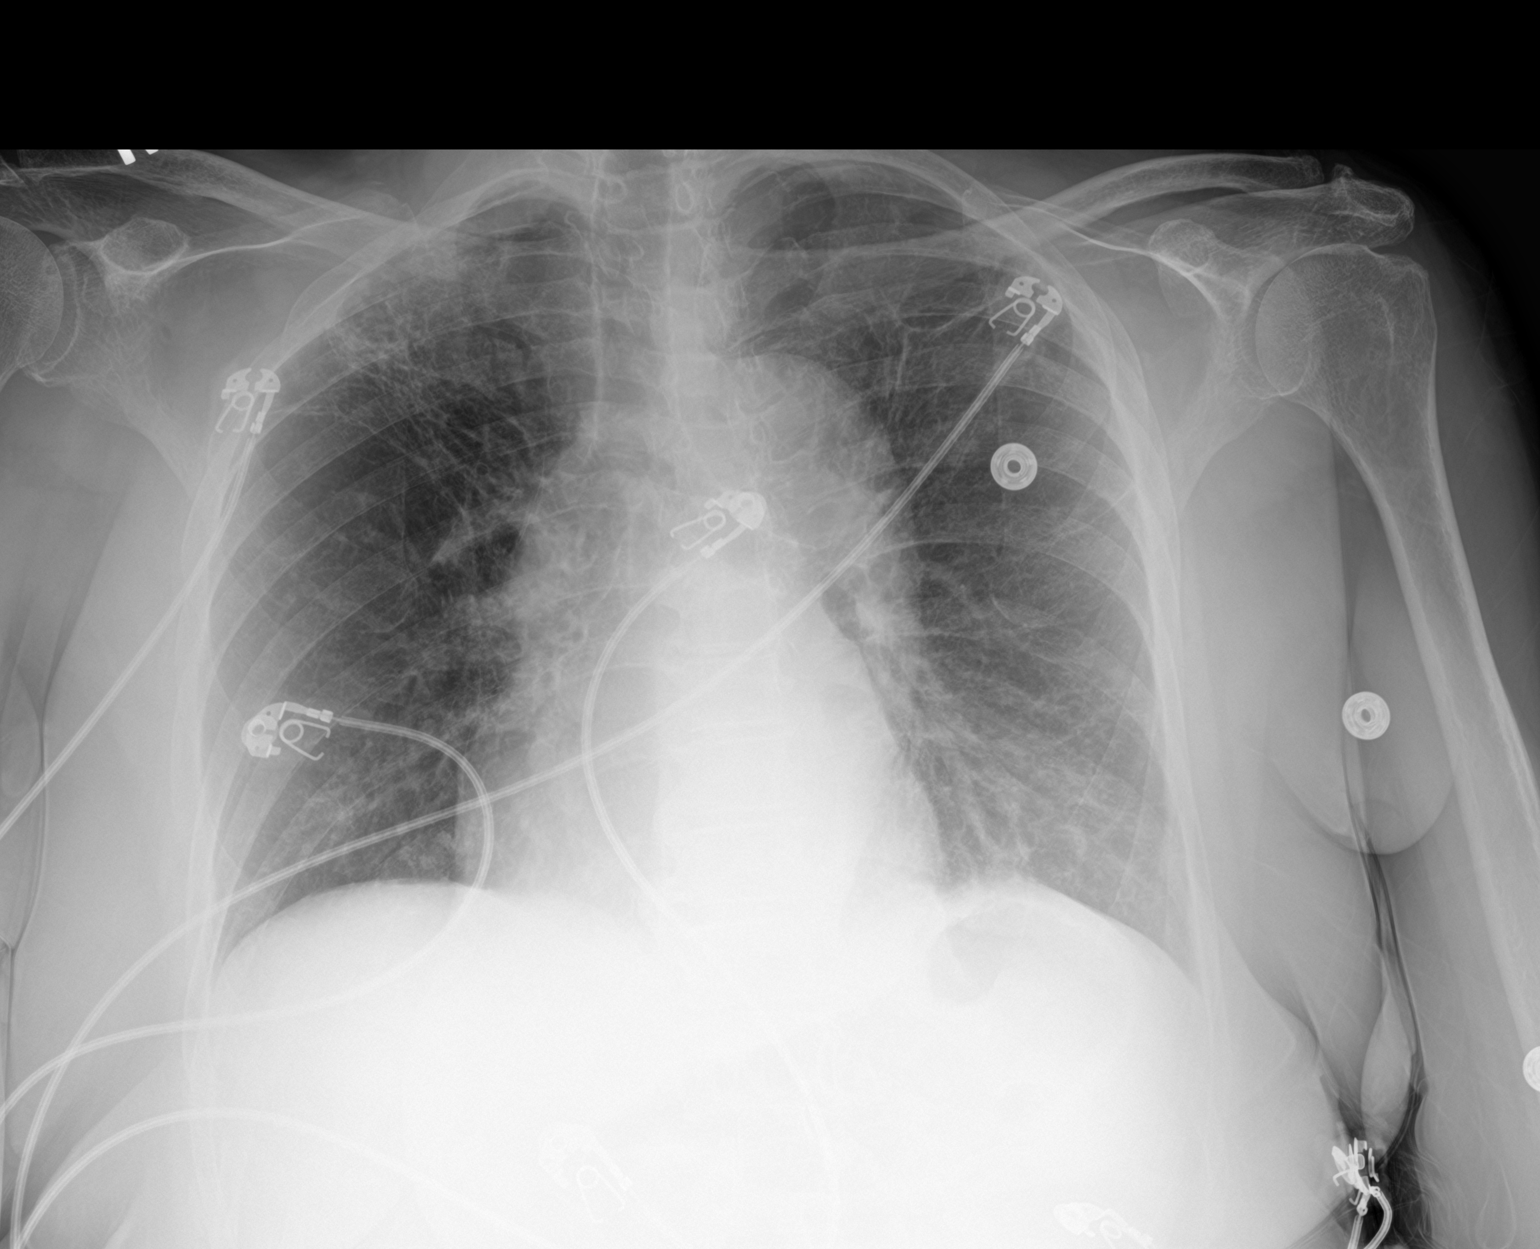

[chest lat]
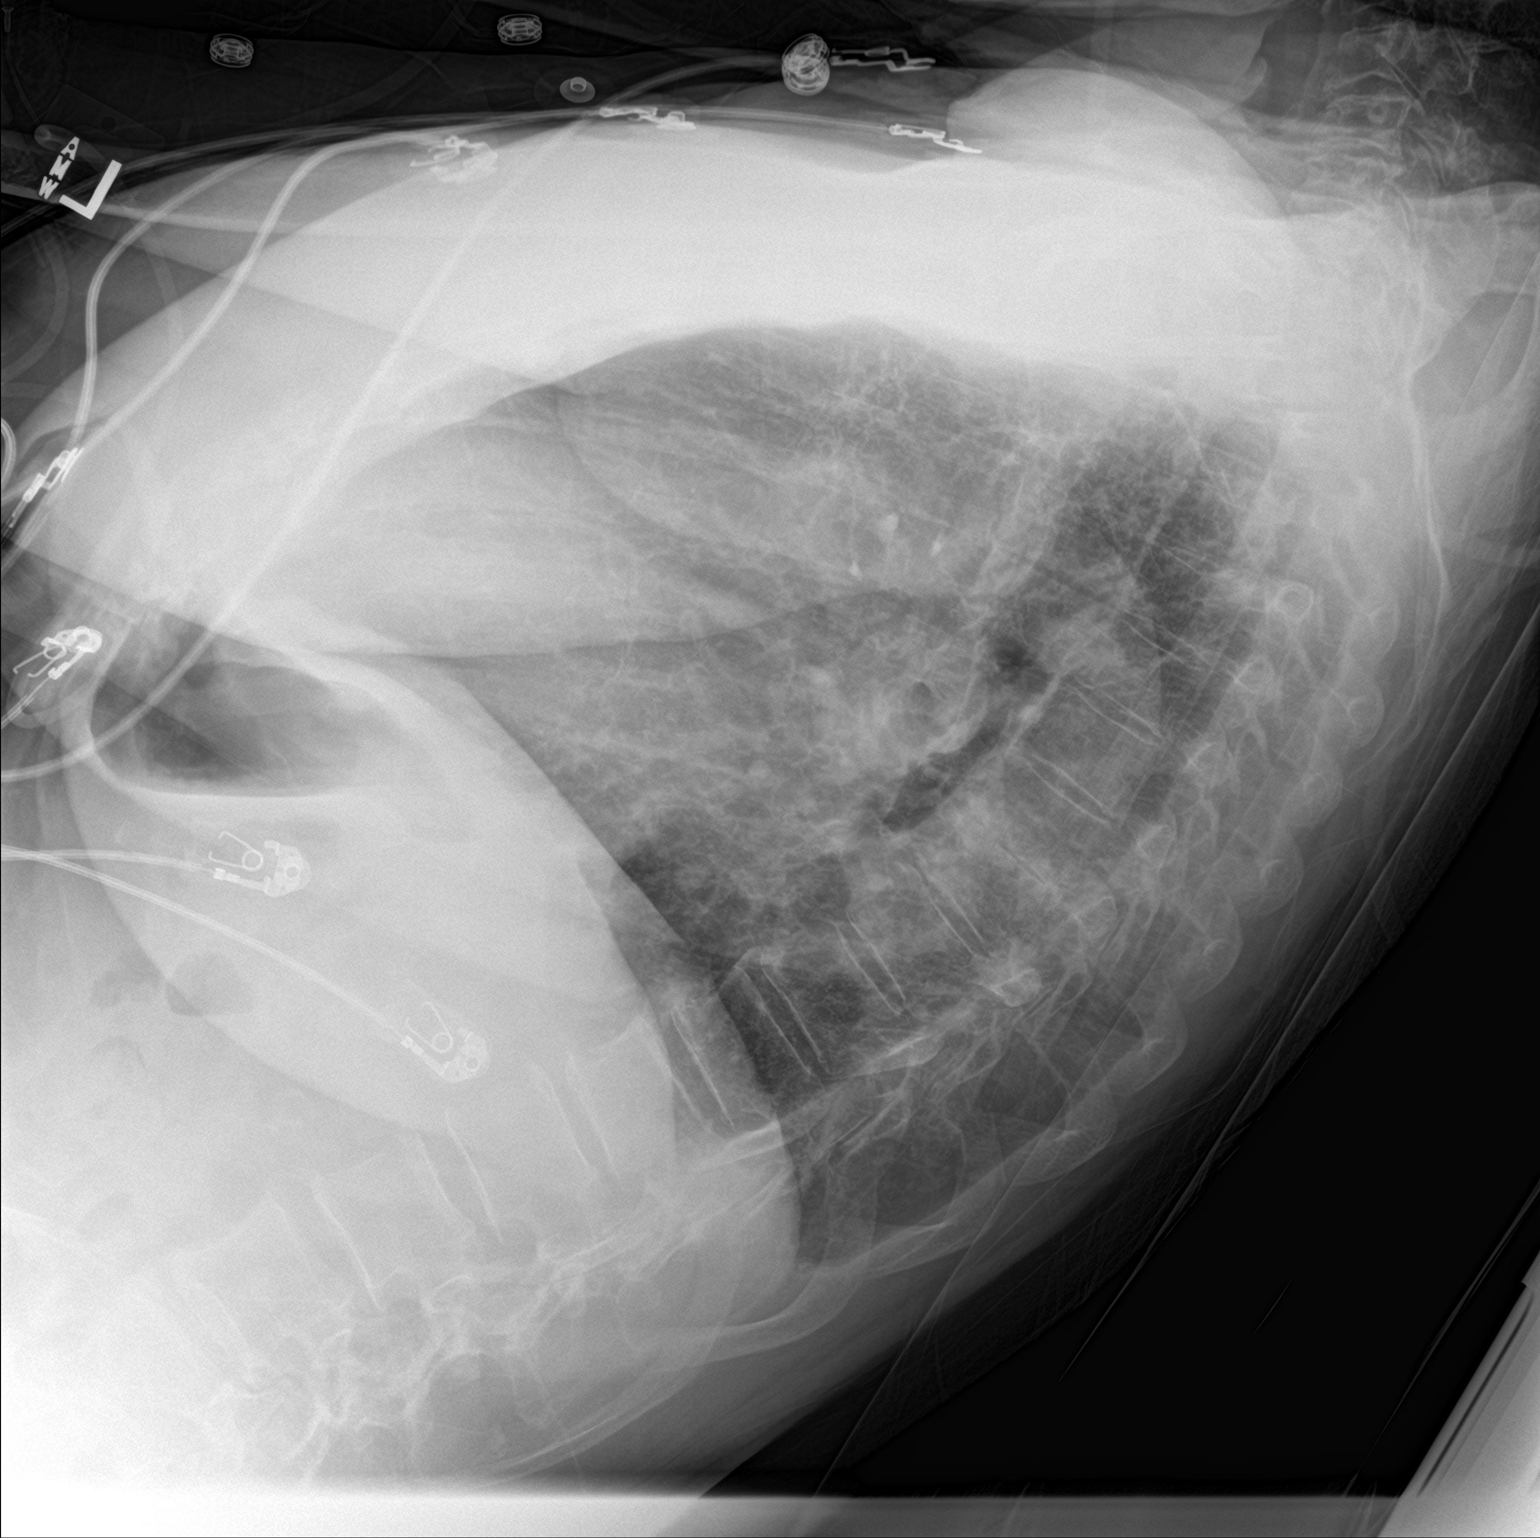

[2 of 2 positions shown; findings below may reference images not displayed]

FINDINGS: Scarring and fibrosis within the right greater than left upper lobe
as before. No acute consolidation or effusion. Stable
cardiomediastinal silhouette with unfolded aorta. No pneumothorax.
Degenerative changes of the spine.
IMPRESSION: No active cardiopulmonary disease. Stable right greater than left
upper lobe scarring and fibrosis.

## 2018-08-20 IMAGING — CT CT HEAD WITHOUT CONTRAST
4 series · 17 of 47 positions shown, 19 images · non-contrast
Comparison: None.

CLINICAL DATA: Syncopal episode.

EXAM:
CT HEAD WITHOUT CONTRAST
TECHNIQUE: Contiguous axial images were obtained from the base of the skull
through the vertex without intravenous contrast.

[Series 3: head without · axial · non-contrast · 0.44mm/px · z∈[-84,+40]mm · 7 of 35 slices shown, 9 images]
[im 5/35  brain]
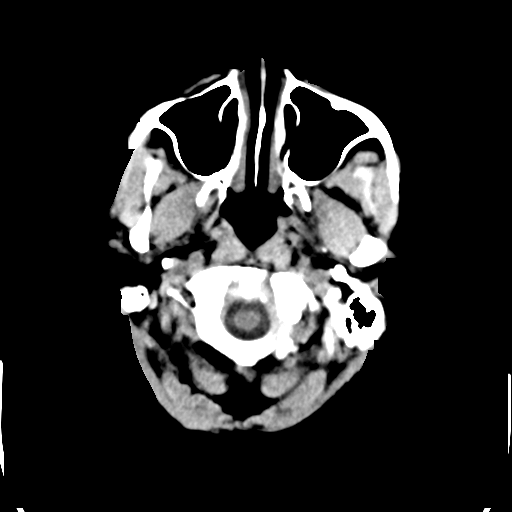
[im 5/35  bone]
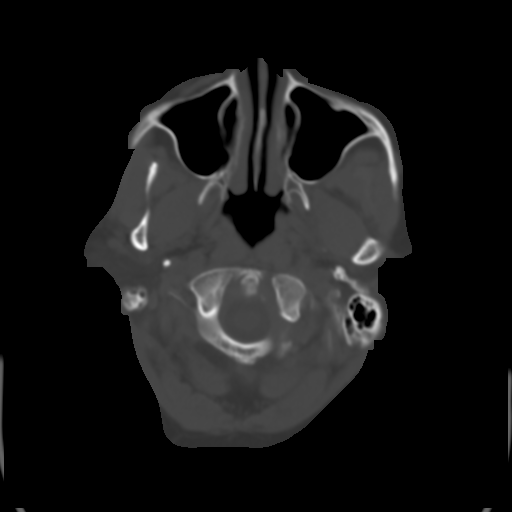
[im 9/35  brain]
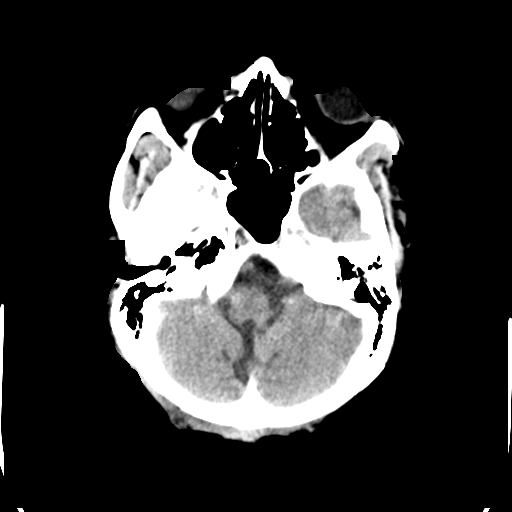
[im 13/35  brain]
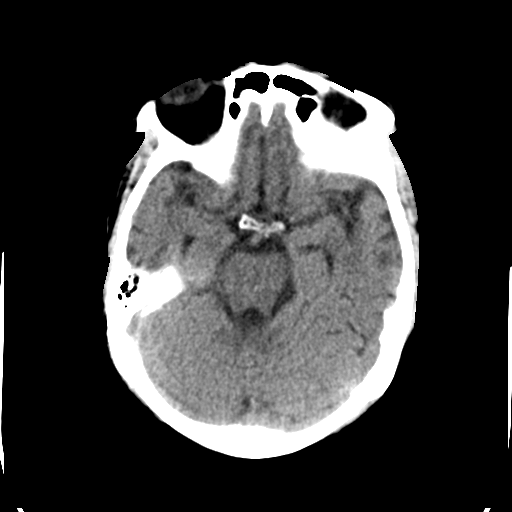
[im 18/35  brain]
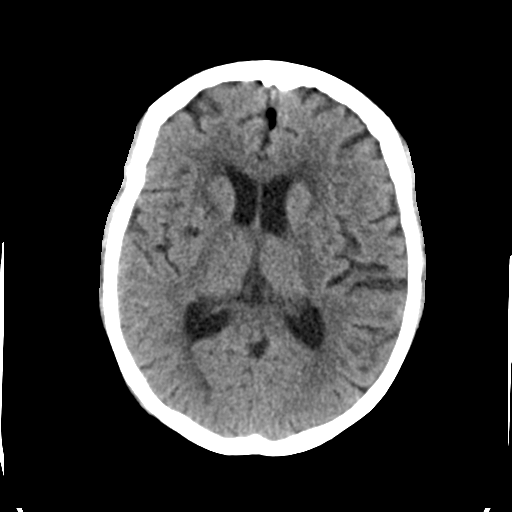
[im 22/35  brain]
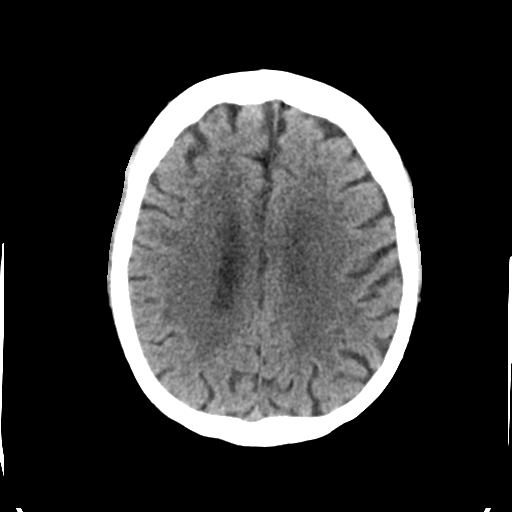
[im 22/35  bone]
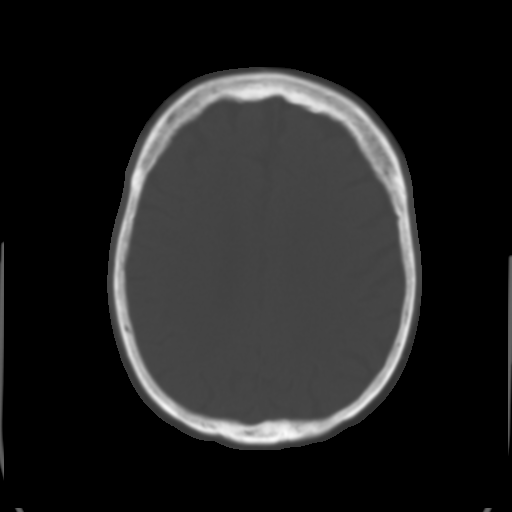
[im 26/35  brain]
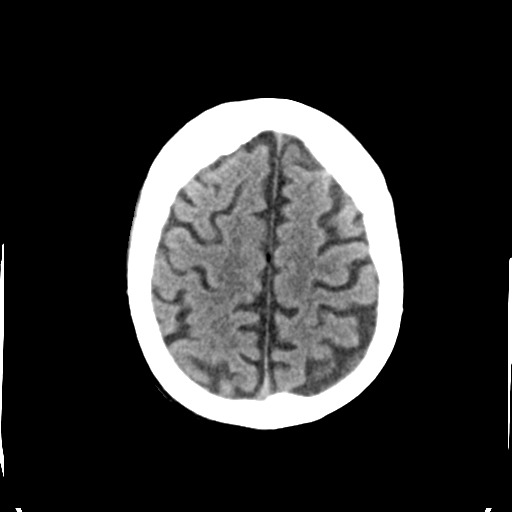
[im 30/35  brain]
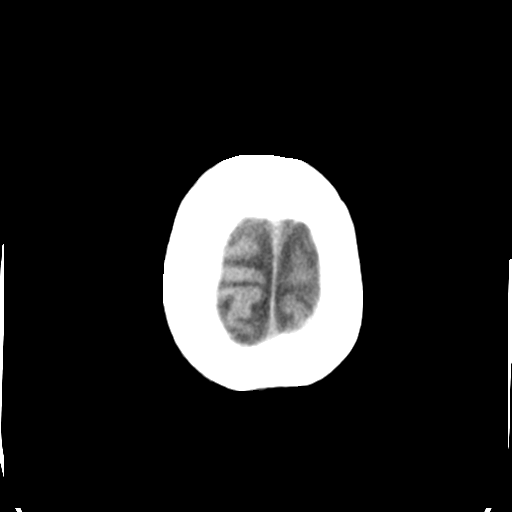

[Series 4: head bone · axial · 0.44mm/px · z∈[-88,-28]mm · 4 of 86 slices shown]
[im 9/86  bone]
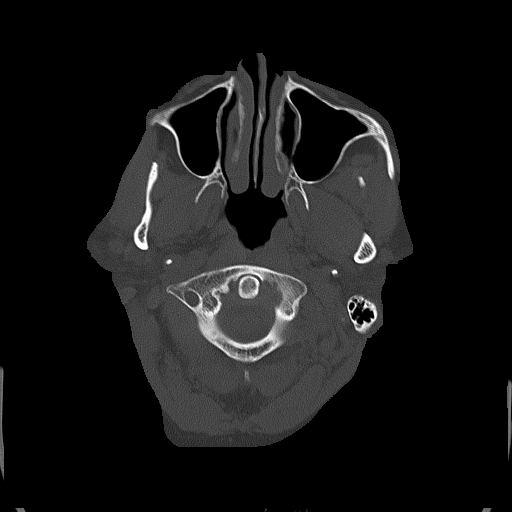
[im 18/86  bone]
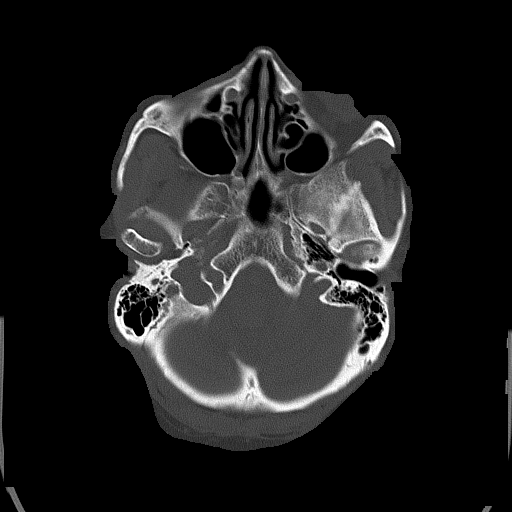
[im 26/86  bone]
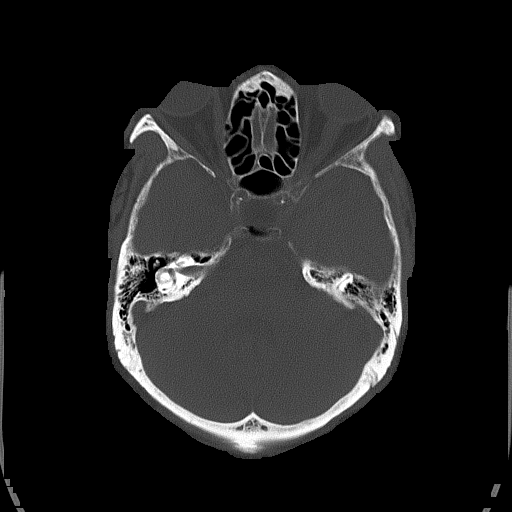
[im 39/86  bone]
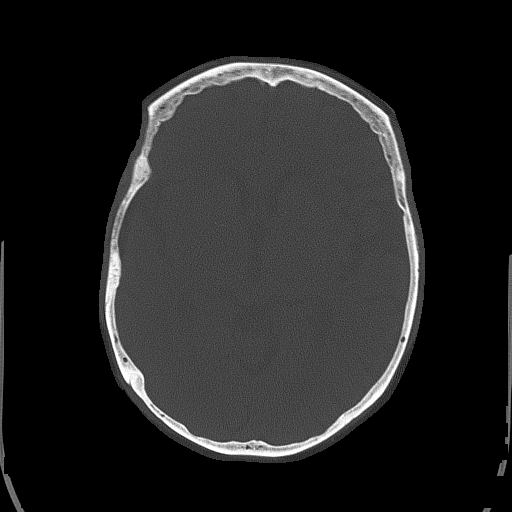

[Series 5: head without cor · coronal · non-contrast · 0.32mm/px · 3 of 67 slices shown]
[im 23/67  brain]
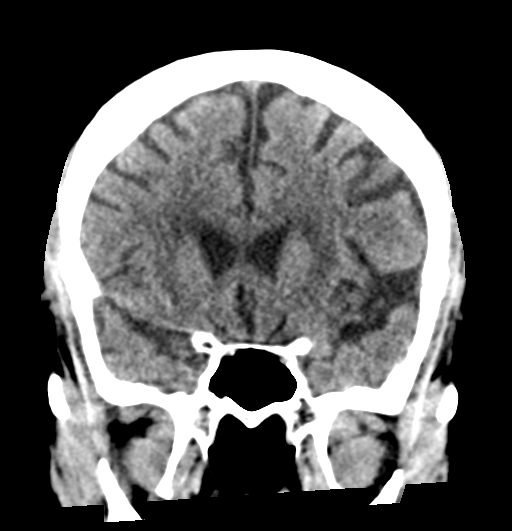
[im 30/67  brain]
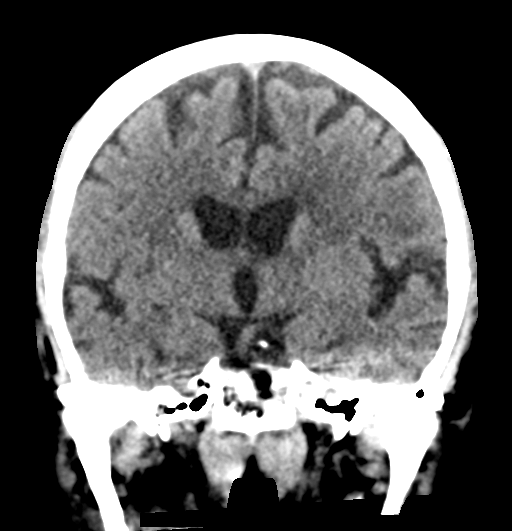
[im 37/67  brain]
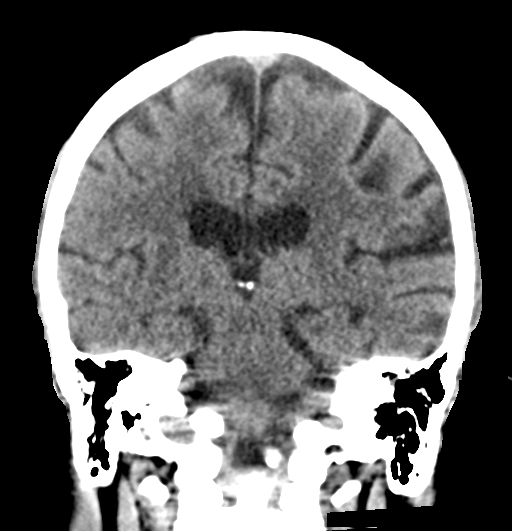

[Series 6: head without sag · sagittal · non-contrast · 0.33mm/px · 3 of 58 slices shown]
[im 20/58  brain]
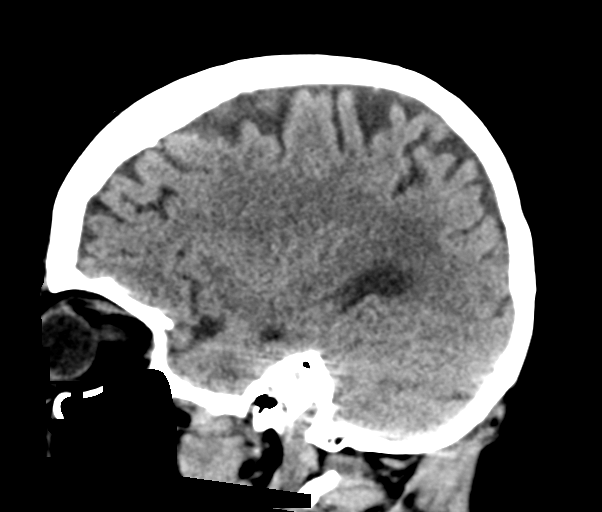
[im 29/58  brain]
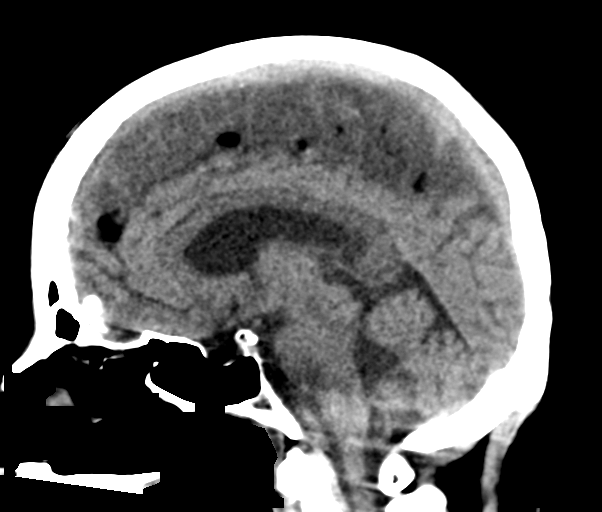
[im 39/58  brain]
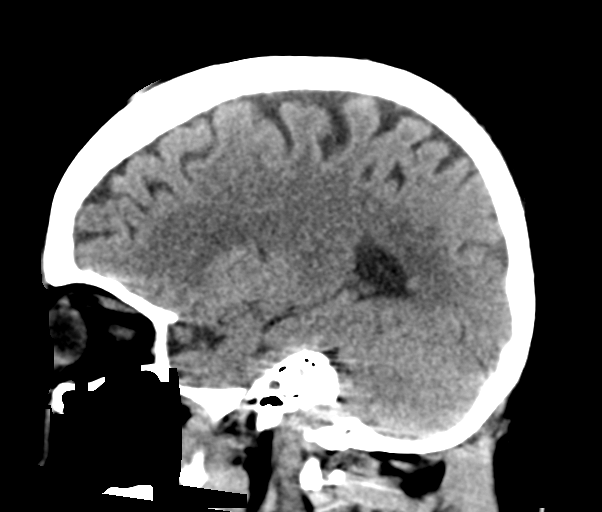

[17 of 47 positions shown; findings below may reference images not displayed]

FINDINGS: Brain: The ventricles are in the midline without mass effect or
shift. No extra-axial fluid collections are identified.

Age related cerebral atrophy, ventriculomegaly and periventricular
white matter disease. Remote appearing lacunar type right basal
ganglia infarcts are noted. No CT findings for acute hemispheric
infarction or intracranial hemorrhage. No mass lesions. The
brainstem and cerebellum are unremarkable.

Small midline lipomas are noted.

Vascular: Vascular calcifications but no definite aneurysm or
hyperdense vessels.

Skull: No skull fracture or bone lesions.

Sinuses/Orbits: The paranasal sinuses and mastoid air cells are
clear. The globes are intact.

Other: No scalp lesions or hematoma.
IMPRESSION: 1. No CT findings for an acute intracranial process.
2. Small lacunar type infarcts in the right basal ganglia region,
likely remote.
3. Small midline lipomas.
4. Age related cerebral atrophy, ventriculomegaly and
periventricular white matter disease.

## 2018-08-20 MED ORDER — NIFEDIPINE ER OSMOTIC RELEASE 30 MG PO TB24
30.0000 mg | ORAL_TABLET | Freq: Every day | ORAL | Status: DC
  Administered 2018-08-20 – 2018-08-26 (×7): 30 mg via ORAL
  Filled 2018-08-20 (×8): qty 1

## 2018-08-20 MED ORDER — SODIUM CHLORIDE 0.9 % IV BOLUS
1000.0000 mL | Freq: Once | INTRAVENOUS | Status: AC
  Administered 2018-08-20: 1000 mL via INTRAVENOUS

## 2018-08-20 MED ORDER — ONDANSETRON HCL 4 MG/2ML IJ SOLN: 4.0000 mg | Freq: Four times a day (QID) | INTRAMUSCULAR | Status: DC | PRN

## 2018-08-20 MED ORDER — NEPRO/CARBSTEADY PO LIQD
237.0000 mL | Freq: Two times a day (BID) | ORAL | Status: DC
  Administered 2018-08-21: 237 mL via ORAL

## 2018-08-20 MED ORDER — ATORVASTATIN CALCIUM 10 MG PO TABS
10.0000 mg | ORAL_TABLET | Freq: Every day | ORAL | Status: DC
  Administered 2018-08-20 – 2018-08-26 (×7): 10 mg via ORAL
  Filled 2018-08-20 (×7): qty 1

## 2018-08-20 MED ORDER — ACETAMINOPHEN 325 MG PO TABS
650.0000 mg | ORAL_TABLET | Freq: Four times a day (QID) | ORAL | Status: DC | PRN
  Administered 2018-08-21 – 2018-08-24 (×3): 650 mg via ORAL
  Filled 2018-08-20 (×3): qty 2

## 2018-08-20 MED ORDER — SODIUM BICARBONATE 650 MG PO TABS
650.0000 mg | ORAL_TABLET | Freq: Two times a day (BID) | ORAL | Status: DC
  Administered 2018-08-20 – 2018-08-22 (×5): 650 mg via ORAL
  Filled 2018-08-20 (×6): qty 1

## 2018-08-20 MED ORDER — SODIUM CHLORIDE 0.9 % IV SOLN
INTRAVENOUS | Status: DC
  Administered 2018-08-20 – 2018-08-21 (×2): via INTRAVENOUS

## 2018-08-20 MED ORDER — DULOXETINE HCL 60 MG PO CPEP
60.0000 mg | ORAL_CAPSULE | Freq: Every day | ORAL | Status: DC
  Administered 2018-08-20 – 2018-08-24 (×5): 60 mg via ORAL
  Filled 2018-08-20 (×5): qty 1

## 2018-08-20 MED ORDER — LABETALOL HCL 200 MG PO TABS
200.0000 mg | ORAL_TABLET | Freq: Two times a day (BID) | ORAL | Status: DC
  Administered 2018-08-20 – 2018-08-27 (×13): 200 mg via ORAL
  Filled 2018-08-20 (×15): qty 1

## 2018-08-20 MED ORDER — ONDANSETRON HCL 4 MG PO TABS: 4.0000 mg | ORAL_TABLET | Freq: Four times a day (QID) | ORAL | Status: DC | PRN

## 2018-08-20 MED ORDER — HEPARIN SODIUM (PORCINE) 5000 UNIT/ML IJ SOLN
5000.0000 [IU] | Freq: Three times a day (TID) | INTRAMUSCULAR | Status: DC
  Administered 2018-08-20 – 2018-08-21 (×2): 5000 [IU] via SUBCUTANEOUS
  Filled 2018-08-20 (×2): qty 1

## 2018-08-20 NOTE — ED Notes (Signed)
3E is unable to take report at this time. Nurse will call back in 5 minutes.

## 2018-08-20 NOTE — ED Notes (Signed)
ED TO INPATIENT HANDOFF REPORT  ED Nurse Name and Phone #:  Aviva Signs Name/Age/Gender Bianca Myers 73 y.o. female Room/Bed: 033C/033C  Code Status   Code Status: Not on file  Home/SNF/Other Home Patient oriented to: self, place, time and situation Is this baseline? Yes   Triage Complete: Triage complete  Chief Complaint Syncope  Triage Note Pt to ER from doctor's office where she experienced 2 syncopal episodes. EMS reports they were told pt was hypotensive in the 80's prior to the syncopal episode, there was no injury or fall. Patient family reports an additional syncopal episode at home this morning. Patient reports she did fall at home, from what she was told (does not remember) but did not hit her head. She denies pain at this time. Reports brief episode of shortness of breath that was resolved with oxygen. Pt is a/o x4 at this time in NAD.    Allergies No Known Allergies  Level of Care/Admitting Diagnosis ED Disposition    ED Disposition Condition [facility] Area: [place] [[id number]]  Level of Care: Telemetry Cardiac [103]  Covid Evaluation: N/A  Diagnosis: Syncope [206001]  Admitting Physician: Merton Border [[id number]]  Attending Physician: Laren Everts, [place]  Estimated length of stay: past midnight tomorrow  Certification:: I certify this patient will need inpatient services for at least 2 midnights  PT Class (Do Not Modify): Inpatient [101]  PT Acc Code (Do Not Modify): Private [1]       B Medical/Surgery History History reviewed. No pertinent past medical history. History reviewed. No pertinent surgical history.   A IV Location/Drains/Wounds Patient Lines/Drains/Airways Status   Active Line/Drains/Airways    Name:   Placement date:   Placement time:   Site:   Days:   Peripheral IV 08/20/18 Left Antecubital   08/20/18    1301    Antecubital   less than 1          Intake/Output Last 24 hours  Intake/Output Summary  (Last 24 hours) at 08/20/2018 1637 Last data filed at 08/20/2018 1504 Gross per 24 hour  Intake 1000 ml  Output -  Net 1000 ml    Labs/Imaging Results for orders placed or performed during the hospital encounter of 08/20/18 (from the past 48 hour(s))  Comprehensive metabolic panel     Status: Abnormal   Collection Time: 08/20/18  1:24 PM  Result Value Ref Range   Sodium 134 (L) 135 - 145 mmol/L   Potassium 4.8 3.5 - 5.1 mmol/L   Chloride 98 98 - 111 mmol/L   CO2 18 (L) 22 - 32 mmol/L   Glucose, Bld 165 (H) 70 - 99 mg/dL   BUN 138 (H) 8 - 23 mg/dL   Creatinine, Ser 18.80 (H) 0.44 - 1.00 mg/dL   Calcium 9.2 8.9 - 10.3 mg/dL   Total Protein 6.3 (L) 6.5 - 8.1 g/dL   Albumin 3.3 (L) 3.5 - 5.0 g/dL   AST 9 (L) 15 - 41 U/L   ALT 10 0 - 44 U/L   Alkaline Phosphatase 47 38 - 126 U/L   Total Bilirubin 0.8 0.3 - 1.2 mg/dL   GFR calc non Af Amer 2 (L) >60 mL/min   GFR calc Af Amer 2 (L) >60 mL/min   Anion gap 18 (H) 5 - 15    Comment: Performed at [facility], [address], [place], [place] [address]  CBC with Differential     Status: Abnormal  Collection Time: 08/20/18  1:24 PM  Result Value Ref Range   WBC 7.0 4.0 - 10.5 K/uL   RBC 2.59 (L) 3.87 - 5.11 MIL/uL   Hemoglobin 7.0 (L) 12.0 - 15.0 g/dL   HCT 22.8 (L) 36.0 - 46.0 %   MCV 88.0 80.0 - 100.0 fL   MCH 27.0 26.0 - 34.0 pg   MCHC 30.7 30.0 - 36.0 g/dL   RDW 17.5 (H) 11.5 - 15.5 %   Platelets 350 150 - 400 K/uL   nRBC 0.0 0.0 - 0.2 %   Neutrophils Relative % 79 %   Neutro Abs 5.5 1.7 - 7.7 K/uL   Lymphocytes Relative 13 %   Lymphs Abs 0.9 0.7 - 4.0 K/uL   Monocytes Relative 5 %   Monocytes Absolute 0.4 0.1 - 1.0 K/uL   Eosinophils Relative 3 %   Eosinophils Absolute 0.2 0.0 - 0.5 K/uL   Basophils Relative 0 %   Basophils Absolute 0.0 0.0 - 0.1 K/uL   Immature Granulocytes 0 %   Abs Immature Granulocytes 0.02 0.00 - 0.07 K/uL    Comment: Performed at [facility], [address] [place], [place]  [address]  Troponin I - Once     Status: Abnormal   Collection Time: 08/20/18  1:24 PM  Result Value Ref Range   Troponin I 0.04 (HH) <0.03 ng/mL    Comment: CRITICAL RESULT CALLED TO, READ BACK BY AND VERIFIED WITH: [place] [id number] BY A BENNETT Performed at [facility], [place] [address], [place], [place] [address]   SARS Coronavirus 2     Status: None   Collection Time: 08/20/18  3:11 PM  Result Value Ref Range   SARS Coronavirus 2 NOT DETECTED NOT DETECTED    Comment: (NOTE) SARS-CoV-2 target nucleic acids are NOT DETECTED. The SARS-CoV-2 RNA is generally detectable in upper and lower respiratory specimens during the acute phase of infection.  Negative  results do not preclude SARS-CoV-2 infection, do not rule out co-infections with other pathogens, and should not be used as the sole basis for treatment or other patient management decisions.  Negative results must be combined with clinical observations, patient history, and epidemiological information. The expected result is Not Detected. Fact Sheet for Patients: http://www.biofiredefense.com/wp-content/uploads/2020/03/BIOFIRE-COVID -19-patients.pdf Fact Sheet for Healthcare Providers: http://www.biofiredefense.com/wp-content/uploads/2020/03/BIOFIRE-COVID -19-hcp.pdf This test is not yet approved or cleared by the [place] and  has been authorized for detection and/or diagnosis of SARS-CoV-2 by FDA under an Emergency Use Authorization (EUA).  This EUA will remain in effec t (meaning this test can be used) for the duration of  the COVID-19 declaration under Section 564(b)(1) of the Act, 21 U.S.C. section 360bbb-3(b)(1), unless the authorization is terminated or revoked sooner. Performed at [facility], [place] [address] [place],  [address]   POC occult blood, ED     Status: None   Collection Time: 08/20/18  3:21 PM  Result Value Ref Range   Fecal Occult Bld NEGATIVE NEGATIVE   Dg Chest 2  View  Result Date: 08/20/2018 CLINICAL DATA:  Syncope EXAM: CHEST - 2 VIEW COMPARISON:  10/31/2013 FINDINGS: Scarring and fibrosis within the right greater than left upper lobe as before. No acute consolidation or effusion. Stable cardiomediastinal silhouette with unfolded aorta. No pneumothorax. Degenerative changes of the spine. IMPRESSION: No active cardiopulmonary disease. Stable right greater than left upper lobe scarring and fibrosis. Electronically Signed   By: Donavan Foil M.D.   On:  08/20/2018 14:49   Ct Head Wo Contrast  Result Date: 08/20/2018 CLINICAL DATA:  Syncopal episode. EXAM: CT HEAD WITHOUT CONTRAST TECHNIQUE: Contiguous axial images were obtained from the base of the skull through the vertex without intravenous contrast. COMPARISON:  None. FINDINGS: Brain: The ventricles are in the midline without mass effect or shift. No extra-axial fluid collections are identified. Age related cerebral atrophy, ventriculomegaly and periventricular white matter disease. Remote appearing lacunar type right basal ganglia infarcts are noted. No CT findings for acute hemispheric infarction or intracranial hemorrhage. No mass lesions. The brainstem and cerebellum are unremarkable. 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 Vascular: Vascular calcifications but no definite aneurysm or hyperdense vessels. Skull: No skull fracture or bone lesions. Sinuses/Orbits: The paranasal sinuses and mastoid air cells are clear. The globes are intact. Other: No scalp lesions or hematoma. IMPRESSION: 1. No CT findings for an acute intracranial process. 2. 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 3. Small midline lipomas. 4. Age related cerebral atrophy, ventriculomegaly and periventricular white matter disease. Electronically Signed   By: Marijo Sanes M.D.   On: 08/20/2018 14:22    Pending Labs Unresulted Labs (From admission, onward)    Start     Ordered   08/20/18 1324  Urinalysis, Routine  w reflex microscopic  ONCE - STAT,   STAT     08/20/18 1324   Signed and Held  Basic metabolic panel  Tomorrow morning,   R     Signed and Held   Signed and Held  Troponin I - Now Then Q6H  Now then every 6 hours,   R     Signed and Held          Vitals/Pain Today's Vitals   08/20/18 1400 08/20/18 1503 08/20/18 1530 08/20/18 1600  BP: (!) 116/98 140/77 (!) 151/82 (!) 156/87  Pulse: 64 76 72 63  Resp: 18 19 16 20   Temp:      TempSrc:      SpO2: 100% 100% 100% 100%  PainSc:  0-No pain      Isolation Precautions No active isolations  Medications Medications  sodium chloride 0.9 % bolus 1,000 mL (0 mLs Intravenous Stopped 08/20/18 1504)    Mobility walks with device Moderate fall risk   Focused Assessments Cardiac Assessment Handoff:  Cardiac Rhythm: Normal sinus rhythm Lab Results  Component Value Date   TROPONINI 0.04 ([place]) 08/20/2018   No results found for: DDIMER Does the Patient currently have chest pain? No     R Recommendations: See Admitting Provider Note  Report given to:   Additional Notes:

## 2018-08-20 NOTE — ED Provider Notes (Signed)
[name] Provider Note   CSN: [id number] Arrival date & time: 08/20/18  1248    History   Chief Complaint Chief Complaint  Patient presents with   Loss of Consciousness    HPI Bianca Myers is a 73 y.o. female with PMHx CKD who presents to the ED via EMS for multiple episodes of syncope that occurred earlier today. Pt reports she was on her way to her nephrologist's office for a regular check up when she felt lightheaded and passed out; pt's son was able to catch her prior to falling down. Pt's son then drove her to her appointment when she passed out again in the wheelchair in the lobby twice. Pt denies any symptoms besides lightheadedness prior to passing out. No shortness of breath, headache, chest pain. Reports that she has been feeling generally weak for "Awhile now." Pt has hx of CKD; reports her neprhologist has wanted her to start dialysis for over a year now but she did not want to do it; she believes she may need to be on dialysis now. Pt has no other complaints at this time. Denies fever, chills, abdominal pain, nausea, vomiting, diarrhea, constipation, urinary sx, speech difficulty, unilateral weakness or numbness.      The history is provided by the patient and a relative.    History reviewed. No pertinent past medical history.  Patient Active Problem List   Diagnosis Date Noted   Chronic kidney disease (CKD), stage IV (severe) ([place]) 12/10/2015    History reviewed. No pertinent surgical history.   OB History   No obstetric history on file.      Home Medications    Prior to Admission medications   Medication Sig Start Date End Date Taking? Authorizing Provider  acetaminophen (TYLENOL) 325 MG tablet Take 650 mg by mouth every 6 (six) hours as needed for headache.   Yes [provider]  Ascorbic Acid (VITAMIN C) 1000 MG tablet Take 1,000 mg by mouth 2 (two) times a day.    Yes [provider]    atorvastatin (LIPITOR) 10 MG tablet Take 10 mg by mouth at bedtime. 05/14/18  Yes [provider]  Biotin (BIOTIN 5000) 5 MG CAPS Take 5,000 capsules by mouth at bedtime.    Yes [provider]  DULoxetine (CYMBALTA) 60 MG capsule Take 60 mg by mouth at bedtime.    Yes [provider]  furosemide (LASIX) 40 MG tablet Take 40 mg by mouth 2 (two) times daily.    Yes [provider]  labetalol (NORMODYNE) 200 MG tablet Take 200 mg by mouth 2 (two) times daily.   Yes [provider]  NIFEdipine (PROCARDIA-XL/ADALAT CC) 30 MG 24 hr tablet Take 30 mg by mouth at bedtime.    Yes [provider]  Omega-3 Fatty Acids (FISH OIL) 1000 MG CAPS Take by mouth.   Yes [provider]  sodium bicarbonate 650 MG tablet Take 650 mg by mouth 2 (two) times daily.    Yes [provider]  Turmeric 500 MG CAPS Take 500 mg by mouth daily.    Yes [provider]    Family History No family history on file.  Social History Social History   Tobacco Use   Smoking status: Former Smoker    Quit date: 1973    Years since quitting: 47.4   Smokeless tobacco: Never Used  Substance Use Topics   Alcohol use: Not on file   Drug use: Not on  file     Allergies   Patient has no known allergies.   Review of Systems Review of Systems  Constitutional: Negative for chills and fever.  HENT: Negative for congestion.   Eyes: Negative for visual disturbance.  Respiratory: Negative for cough and shortness of breath.   Cardiovascular: Negative for chest pain.  Gastrointestinal: Negative for abdominal pain, constipation, diarrhea, nausea and vomiting.  Genitourinary: Negative for dysuria and frequency.  Musculoskeletal: Negative for back pain and myalgias.  Skin: Negative for wound.  Neurological: Positive for syncope and light-headedness. Negative for dizziness, speech difficulty, weakness, numbness and headaches.     Physical  Exam Updated Vital Signs BP 118/79 (BP Location: Right Arm)    Pulse 65    Temp 98.1 F (36.7 C) (Oral)    Resp 16    SpO2 99%   Physical Exam Vitals signs and nursing note reviewed.  Constitutional:      Appearance: She is not ill-appearing.  HENT:     Head: Normocephalic and atraumatic.  Eyes:     Extraocular Movements: Extraocular movements intact.     Conjunctiva/sclera: Conjunctivae normal.     Pupils: Pupils are equal, round, and reactive to light.  Neck:     Musculoskeletal: Neck supple.  Cardiovascular:     Rate and Rhythm: Normal rate and regular rhythm.  Pulmonary:     Effort: Pulmonary effort is normal.     Breath sounds: Normal breath sounds. No wheezing, rhonchi or rales.  Abdominal:     Palpations: Abdomen is soft.     Tenderness: There is no abdominal tenderness. There is no guarding or rebound.  Musculoskeletal:     Right lower leg: No edema.     Left lower leg: No edema.  Skin:    General: Skin is warm and dry.  Neurological:     Mental Status: She is alert.     Comments: CN 3-12 grossly intact A&O x4 GCS 15 Sensation and strength intact Coordination with finger-to-nose WNL Neg pronator drift       ED Treatments / Results  Labs (all labs ordered are listed, but only abnormal results are displayed) Labs Reviewed  COMPREHENSIVE METABOLIC PANEL - Abnormal; Notable for the following components:      Result Value   Sodium 134 (*)    CO2 18 (*)    Glucose, Bld 165 (*)    BUN 138 (*)    Creatinine, Ser 18.80 (*)    Total Protein 6.3 (*)    Albumin 3.3 (*)    AST 9 (*)    GFR calc non Af Amer 2 (*)    GFR calc Af Amer 2 (*)    Anion gap 18 (*)    All other components within normal limits  CBC WITH DIFFERENTIAL/PLATELET - Abnormal; Notable for the following components:   RBC 2.59 (*)    Hemoglobin 7.0 (*)    HCT 22.8 (*)    RDW 17.5 (*)    All other components within normal limits  TROPONIN I - Abnormal; Notable for the following components:    Troponin I 0.04 (*)    All other components within normal limits  SARS CORONAVIRUS 2  URINALYSIS, ROUTINE W REFLEX MICROSCOPIC  POC OCCULT BLOOD, ED    EKG None  Radiology Dg Chest 2 View  Result Date: 08/20/2018 CLINICAL DATA:  Syncope EXAM: CHEST - 2 VIEW COMPARISON:  10/31/2013 FINDINGS: Scarring and fibrosis within the right greater than left upper lobe as before. No  acute consolidation or effusion. Stable cardiomediastinal silhouette with unfolded aorta. No pneumothorax. Degenerative changes of the spine. IMPRESSION: No active cardiopulmonary disease. Stable right greater than left upper lobe scarring and fibrosis. Electronically Signed   By: Donavan Foil M.D.   On: 08/20/2018 14:49   Ct Head Wo Contrast  Result Date: 08/20/2018 CLINICAL DATA:  Syncopal episode. EXAM: CT HEAD WITHOUT CONTRAST TECHNIQUE: Contiguous axial images were obtained from the base of the skull through the vertex without intravenous contrast. COMPARISON:  None. FINDINGS: Brain: The ventricles are in the midline without mass effect or shift. No extra-axial fluid collections are identified. Age related cerebral atrophy, ventriculomegaly and periventricular white matter disease. Remote appearing lacunar type right basal ganglia infarcts are noted. No CT findings for acute hemispheric infarction or intracranial hemorrhage. No mass lesions. The brainstem and cerebellum are unremarkable. 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 Vascular: Vascular calcifications but no definite aneurysm or hyperdense vessels. Skull: No skull fracture or bone lesions. Sinuses/Orbits: The paranasal sinuses and mastoid air cells are clear. The globes are intact. Other: No scalp lesions or hematoma. IMPRESSION: 1. No CT findings for an acute intracranial process. 2. 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 3. Small midline lipomas. 4. Age related cerebral atrophy, ventriculomegaly and periventricular white matter  disease. Electronically Signed   By: Marijo Sanes M.D.   On: 08/20/2018 14:22    Procedures Procedures (including critical care time)  Medications Ordered in ED Medications  sodium chloride 0.9 % bolus 1,000 mL (0 mLs Intravenous Stopped 08/20/18 1504)     Initial Impression / Assessment and Plan / ED Course  I have reviewed the triage vital signs and the nursing notes.  Pertinent labs & imaging results that were available during my care of the patient were reviewed by me and considered in my medical decision making (see chart for details).  73 year old female with hx CKD stage IV not on dialysis presenting with multiple episodes of syncope witnessed by son; no head injury or LOC. No symptoms besides lightheadedness prior to passing out including CP, SOB, headache, N/V. Pt at nephrologist Dr. Jason Nest office when syncopal episodes occurred. Pt reports her nephrologist has wanted to put her on dialysis for over a year now but she has declined; she reports it may be time for dialysis. Denies shortness of breath. Does not appear fluid overloaded today. No focal neuro deficits on exam. Will obtain baseline bloodwork including CBC, CMP, Troponin, U/A as well as EKG, CXR to rule out pneumonia, and CT Head to ensure there is no intracranial pathology causing her to pass out. Will reevaluate once labs and imaging return. Discussed case with son Brenton Grills as well; he is in agreement with plan at this time. He made it very clear that they are [employer] and do not want blood of any kind even if deemed necessary.   Dr. Venora Maples attending physician discussed case with Dr. Justin Mend with nephrology who follows patient outpatient; likely patient will need to be admitted for emergent dialysis   Creatinine at 18.80 with GFR 2; BUN 138, and CO2 18. Pt also anemic at 7.0 hgb; this may be due to kidney function but will obtain fecal occult at this time. Troponin elevated 0.04; EKG without signs of ischemia and CXR  clear today. Likely related to severe CKD as well; will need trending troponins inpatient. Will consult both nephrology and hospitalist today for admission. Have updated son Bettyanne Dittman as well. Awaiting U/A  and covid test.   Clinical Course as of Aug 20 1554  Fri Aug 20, 2018  1502 Elevated trop; likely from CKD; no complaints of chest pain or SOB  Troponin I(!!): 0.04 [MV]  1511 Anemia present; will obtain fecal occult  Hemoglobin(!): 7.0 [MV]  1512 Severely elevated creatinine; hx CKD  Creatinine(!): 18.80 [MV]  1512 Stable  Potassium: 4.8 [MV]  1512 Discussed case with nephrologist Dr. Maryjane Hurter who is aware of patient and will follow   [MV]  1519 Discussed case with hospitalist who will admit patient   [MV]    Clinical Course User Index [MV] Eustaquio Maize, PA-C          Final Clinical Impressions(s) / ED Diagnoses   Final diagnoses:  CKD (chronic kidney disease) requiring chronic dialysis ([place])  Creatinine elevation  Syncope, unspecified syncope type  Anemia, unspecified type    ED Discharge Orders    None       Eustaquio Maize, PA-C 08/20/18 [place], Kevin, MD 08/20/18 [phone]

## 2018-08-20 NOTE — ED Triage Notes (Signed)
Pt to ER from doctor's office where she experienced 2 syncopal episodes. EMS reports they were told pt was hypotensive in the 80's prior to the syncopal episode, there was no injury or fall. Patient family reports an additional syncopal episode at home this morning. Patient reports she did fall at home, from what she was told (does not remember) but did not hit her head. She denies pain at this time. Reports brief episode of shortness of breath that was resolved with oxygen. Pt is a/o x4 at this time in NAD.

## 2018-08-20 NOTE — Consult Note (Signed)
[facility]    NEPHROLOGY CONSULTATION NOTE  PATIENT ID:  Bianca Myers, DOB:  [birth year]  HPI: The patient is a 73 y.o. year old female patient with a past medical history significant for stage V chronic kidney disease who presented from her appointment with Dr. Justin Mend after 2 episodes of syncope in the lobby.  She reports feeling weak for quite some time.  In the emergency department, she was noted to have a creatinine of 18.8.  Her hemoglobin was notably 7.  She reports a poor appetite for several weeks.  She denies any recent cold or flulike symptoms, diarrhea, nausea, vomiting, or other associated symptoms.  Renal consultation has been called for acute kidney injury and chronic kidney disease.   Past Medical History:  Diagnosis Date  . Chronic kidney disease (CKD) stage G5/A1, glomerular filtration rate (GFR) less than or equal to 15 mL/min/1.73 square meter and albuminuria creatinine ratio less than 30 mg/g [facility])     Past Surgical History:  Procedure Laterality Date  . HIP SURGERY      History reviewed. No pertinent family history.  Social History   Tobacco Use  . Smoking status: Former Smoker    Quit date: 1973    Years since quitting: 47.4  . Smokeless tobacco: Never Used  Substance Use Topics  . Alcohol use: Not on file  . Drug use: Not on file    REVIEW OF SYSTEMS: General: Positive fatigue, poor appetite head:  no headaches Eyes:  no blurred vision ENT:  no sore throat Neck:  no masses CV:  no chest pain, no orthopnea Lungs:  no shortness of breath, no cough GI:  no nausea or vomiting, no diarrhea GU:  no dysuria or hematuria Skin:  no rashes or lesions Neuro:  no focal numbness or weakness Psych:  no depression or anxiety    PHYSICAL EXAM:  Vitals:   08/20/18 1630 08/20/18 1735  BP: (!) 162/88 (!) 146/75  Pulse: 70 74  Resp: (!) 21   Temp:  97.6 F (36.4 C)  SpO2: 100% 100%   No intake/output data recorded.   General:  AAOx3  NAD HEENT: MMM [place] AT anicteric sclera Neck:  No JVD, no adenopathy CV:  Heart RRR, no rub Lungs:  L/S CTA bilaterally Abd:  abd SNT/ND with normal BS GU:  Bladder non-palpable Extremities: Trace bilateral lower extremity edema Skin:  No skin rash Psych:  normal mood and affect Neuro:  no focal deficits   CURRENT MEDICATIONS:     HOME MEDICATIONS:  Prior to Admission medications   Medication Sig Start Date End Date Taking? Authorizing Provider  acetaminophen (TYLENOL) 325 MG tablet Take 650 mg by mouth every 6 (six) hours as needed for headache.   Yes [provider]  Ascorbic Acid (VITAMIN C) 1000 MG tablet Take 1,000 mg by mouth 2 (two) times a day.    Yes [provider]  atorvastatin (LIPITOR) 10 MG tablet Take 10 mg by mouth at bedtime. 05/14/18  Yes [provider]  Biotin (BIOTIN 5000) 5 MG CAPS Take 5,000 capsules by mouth at bedtime.    Yes [provider]  DULoxetine (CYMBALTA) 60 MG capsule Take 60 mg by mouth at bedtime.    Yes [provider]  furosemide (LASIX) 40 MG tablet Take 40 mg by mouth 2 (two) times daily.    Yes [provider]  labetalol (NORMODYNE) 200 MG tablet Take 200 mg by mouth 2 (two) times daily.  Yes [provider]  NIFEdipine (PROCARDIA-XL/ADALAT CC) 30 MG 24 hr tablet Take 30 mg by mouth at bedtime.    Yes [provider]  Omega-3 Fatty Acids (FISH OIL) 1000 MG CAPS Take by mouth.   Yes [provider]  sodium bicarbonate 650 MG tablet Take 650 mg by mouth 2 (two) times daily.    Yes [provider]  Turmeric 500 MG CAPS Take 500 mg by mouth daily.    Yes [provider]       LABS:  CBC Latest Ref Rng & Units 08/20/2018  WBC 4.0 - 10.5 K/uL 7.0  Hemoglobin 12.0 - 15.0 g/dL 7.0(L)  Hematocrit 36.0 - 46.0 % 22.8(L)  Platelets 150 - 400 K/uL 350    CMP Latest Ref Rng & Units 08/20/2018  Glucose 70 - 99 mg/dL 165(H)  BUN 8 - 23 mg/dL 138(H)   Creatinine 0.44 - 1.00 mg/dL 18.80(H)  Sodium 135 - 145 mmol/L 134(L)  Potassium 3.5 - 5.1 mmol/L 4.8  Chloride 98 - 111 mmol/L 98  CO2 22 - 32 mmol/L 18(L)  Calcium 8.9 - 10.3 mg/dL 9.2  Total Protein 6.5 - 8.1 g/dL 6.3(L)  Total Bilirubin 0.3 - 1.2 mg/dL 0.8  Alkaline Phos 38 - 126 U/L 47  AST 15 - 41 U/L 9(L)  ALT 0 - 44 U/L 10    Lab Results  Component Value Date   CALCIUM 9.2 08/20/2018    No results found for: COLORURINE, APPEARANCEUR, LABSPEC, PHURINE, GLUCOSEU, HGBUR, BILIRUBINUR, KETONESUR, PROTEINUR, UROBILINOGEN, NITRITE, LEUKOCYTESUR No results found for: PHART, PCO2ART, PO2ART, HCO3, TCO2, ACIDBASEDEF, O2SAT  No results found for: IRON, TIBC, FERRITIN, IRONPCTSAT     ASSESSMENT/PLAN:    The patient is a 73 y.o. year old female patient with a past medical history significant for stage V chronic kidney disease who presented from her appointment with Dr. Justin Mend after 2 episodes of syncope in the lobby.  She reports feeling weak for quite some time.  In the emergency department, she was noted to have a creatinine of 18.8.   1.  Chronic kidney disease stage V.  Her renal function has significantly worsened.  With the syncope, it certainly suggest some component of volume depletion.  Will administer IV fluids tonight.  If no significant improvement, will start dialysis tomorrow.  Patient is agreeable.  2.  Anemia.  Hemoccult was negative.  Likely secondary to chronic kidney disease.  Will check iron stores.  3.  Syncope.  Suspect hemodynamic in nature.  4.  Hypertension.  Blood pressure currently is stable.  Will continue current therapy.  5.  Metabolic acidosis.  Continue sodium bicarbonate.  6.  Metabolic bone disease.  Check intact PTH and phosphorus    [employer], DO, FACP

## 2018-08-20 NOTE — H&P (Addendum)
[facility]                                                                                    Patient Demographics  Bianca Myers, is a 73 y.o. female  CSN: [id number]  MRN: [id number]  DOB - [birth year]  Admit Date - 08/20/2018  Outpatient Primary MD for the patient is [name], MD   With History of -  History reviewed. No pertinent past medical history.    History reviewed. No pertinent surgical history.  in for   Chief Complaint  Patient presents with  . Loss of Consciousness     HPI  Bianca Myers  is a 73 y.o. female, with past medical history significant for chronic kidney disease stage V who is presenting to the emergency department for a syncopal episode earlier today.  That the patient was on her way to her nephrologist office when this happened.  After further questioning the patient reports similar episodes for the last 2 days and she had another episode in the lobby today.  Patient reports 1 year history of feeling weak and has history for chronic kidney disease stage V.  The patient was advised to start dialysis by her primary nephrologist but refused.  Patient denies any preceding chest pains palpitations, shortness of breath, nausea vomiting or diarrhea. In the emergency room patient's troponin was slightly elevated at 0.04 with a creatinine of 18.8, potassium 4.8.  Her hemoglobin was 7.  Her fecal occult blood was negative .  CT of the head was negative for acute events but small lacunar infarcts in the right basal ganglia were noted with age-related cerebral atrophy.    Review of Systems    In addition to the HPI above,  No Fever-chills, No Headache, No changes with Vision or hearing, No problems swallowing food or Liquids, No Chest pain, Cough or Shortness of Breath, No Abdominal pain, No Nausea or Vommitting, Bowel movements are regular, No Blood in stool or Urine, No dysuria, No new skin rashes or bruises, No new joints  pains-aches,  No recent weight gain or loss, No polyuria, polydypsia or polyphagia, No significant Mental Stressors.     Social History Social History   Tobacco Use  . Smoking status: Former Smoker    Quit date: 1973    Years since quitting: 47.4  . Smokeless tobacco: Never Used  Substance Use Topics  . Alcohol use: Not on file     Family History No family history on file.   Prior to Admission medications   Medication Sig Start Date End Date Taking? Authorizing Provider  acetaminophen (TYLENOL) 325 MG tablet Take 650 mg by mouth every 6 (six) hours as needed for headache.   Yes [provider]  Ascorbic Acid (VITAMIN C) 1000 MG tablet Take 1,000 mg by mouth 2 (two) times a day.    Yes [provider]  atorvastatin (LIPITOR) 10 MG tablet Take 10 mg by mouth at bedtime. 05/14/18  Yes [provider]  Biotin (BIOTIN 5000) 5 MG CAPS Take 5,000 capsules by mouth at bedtime.    Yes [provider]  DULoxetine (CYMBALTA) 60 MG capsule Take  60 mg by mouth at bedtime.    Yes [provider]  furosemide (LASIX) 40 MG tablet Take 40 mg by mouth 2 (two) times daily.    Yes [provider]  labetalol (NORMODYNE) 200 MG tablet Take 200 mg by mouth 2 (two) times daily.   Yes [provider]  NIFEdipine (PROCARDIA-XL/ADALAT CC) 30 MG 24 hr tablet Take 30 mg by mouth at bedtime.    Yes [provider]  Omega-3 Fatty Acids (FISH OIL) 1000 MG CAPS Take by mouth.   Yes [provider]  sodium bicarbonate 650 MG tablet Take 650 mg by mouth 2 (two) times daily.    Yes [provider]  Turmeric 500 MG CAPS Take 500 mg by mouth daily.    Yes [provider]    No Known Allergies  Physical Exam  Vitals  Blood pressure (!) 156/87, pulse 63, temperature 98.1 F (36.7 C), temperature source Oral, resp. rate 20, SpO2 100 %.   1. General chronically ill, looks tired in no acute distress  2. Normal  affect and insight, Not Suicidal or Homicidal, Awake Alert, Oriented X 3.  3. No F.N deficits, grossly, patient moving all extremities.  4. Ears and Eyes appear Normal, Conjunctivae clear, PERRLA. Moist Oral Mucosa.  5. Supple Neck, No JVD, No cervical lymphadenopathy appriciated, No Carotid Bruits.  6. Symmetrical Chest wall movement, decreased breath sounds at the bases  7.  Irregular with PVCs, No Gallops, Rubs or Murmurs, No Parasternal Heave.  8. Positive Bowel Sounds, Abdomen Soft, Non tender, No organomegaly appriciated,No rebound -guarding or rigidity.  9.  No Cyanosis, Normal Skin Turgor, No Skin Rash or Bruise.  10. Good muscle tone,  joints appear normal , no effusions, Normal ROM.    Data Review  CBC Recent Labs  Lab 08/20/18 1324  WBC 7.0  HGB 7.0*  HCT 22.8*  PLT 350  MCV 88.0  MCH 27.0  MCHC 30.7  RDW 17.5*  LYMPHSABS 0.9  MONOABS 0.4  EOSABS 0.2  BASOSABS 0.0   ------------------------------------------------------------------------------------------------------------------  Chemistries  Recent Labs  Lab 08/20/18 1324  NA 134*  K 4.8  CL 98  CO2 18*  GLUCOSE 165*  BUN 138*  CREATININE 18.80*  CALCIUM 9.2  AST 9*  ALT 10  ALKPHOS 47  BILITOT 0.8   ------------------------------------------------------------------------------------------------------------------ CrCl cannot be calculated (Unknown ideal weight.). ------------------------------------------------------------------------------------------------------------------ No results for input(s): TSH, T4TOTAL, T3FREE, THYROIDAB in the last 72 hours.  Invalid input(s): FREET3   Coagulation profile No results for input(s): INR, PROTIME in the last 168 hours. ------------------------------------------------------------------------------------------------------------------- No results for input(s): DDIMER in the last 72  hours. -------------------------------------------------------------------------------------------------------------------  Cardiac Enzymes Recent Labs  Lab 08/20/18 1324  TROPONINI 0.04*   ------------------------------------------------------------------------------------------------------------------ Invalid input(s): POCBNP   ---------------------------------------------------------------------------------------------------------------  Urinalysis No results found for: COLORURINE, APPEARANCEUR, LABSPEC, PHURINE, GLUCOSEU, HGBUR, BILIRUBINUR, KETONESUR, PROTEINUR, UROBILINOGEN, NITRITE, LEUKOCYTESUR  ----------------------------------------------------------------------------------------------------------------   Imaging results:   Dg Chest 2 View  Result Date: 08/20/2018 CLINICAL DATA:  Syncope EXAM: CHEST - 2 VIEW COMPARISON:  10/31/2013 FINDINGS: Scarring and fibrosis within the right greater than left upper lobe as before. No acute consolidation or effusion. Stable cardiomediastinal silhouette with unfolded aorta. No pneumothorax. Degenerative changes of the spine. IMPRESSION: No active cardiopulmonary disease. Stable right greater than left upper lobe scarring and fibrosis. Electronically Signed   By: Donavan Foil M.D.   On: 08/20/2018 14:49   Ct Head Wo Contrast  Result Date: 08/20/2018 CLINICAL DATA:  Syncopal episode. EXAM: CT  HEAD WITHOUT CONTRAST TECHNIQUE: Contiguous axial images were obtained from the base of the skull through the vertex without intravenous contrast. COMPARISON:  None. FINDINGS: Brain: The ventricles are in the midline without mass effect or shift. No extra-axial fluid collections are identified. Age related cerebral atrophy, ventriculomegaly and periventricular white matter disease. Remote appearing lacunar type right basal ganglia infarcts are noted. No CT findings for acute hemispheric infarction or intracranial hemorrhage. No mass lesions. The  brainstem and cerebellum are unremarkable. 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 Vascular: Vascular calcifications but no definite aneurysm or hyperdense vessels. Skull: No skull fracture or bone lesions. Sinuses/Orbits: The paranasal sinuses and mastoid air cells are clear. The globes are intact. Other: No scalp lesions or hematoma. IMPRESSION: 1. No CT findings for an acute intracranial process. 2. 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 3. Small midline lipomas. 4. Age related cerebral atrophy, ventriculomegaly and periventricular white matter disease. Electronically Signed   By: Marijo Sanes M.D.   On: 08/20/2018 14:22    My personal review of EKG: Rhythm NSR, at 67 bpm with intraventricular conduction delay  Assessment & Plan  Syncope Her troponin slightly elevated probably due to demand ischemia and chronic renal insufficiency Admit to telemetry Serial troponins Consult cardiology if needed We will hold Lasix  Acute on chronic renal failure Patient is a candidate for starting hemodialysis. Dr. Justin Mend is following the patient  Hypertension continue with labetalol and Procardia  Anxiety/depression continue with Cymbalta  Hyperlipidemia continue with Lipitor  Anemia of chronic disease with negative guaiac Last hemoglobin 7 probably due to CKD      DVT Prophylaxis Heparin   AM Labs Ordered, also please review Full Orders  Code Status full  Family: Discussed with her son Brenton Grills at [phone]  Disposition Plan: Home  Time spent in minutes : 44 minutes  Condition GUARDED   @SIGNATURE @

## 2018-08-20 NOTE — ED Notes (Signed)
Patient transported to X-ray 

## 2018-08-20 NOTE — ED Notes (Signed)
Pt reports feeling generally weak and fatigued for the last month. Denies illness.

## 2018-08-21 ENCOUNTER — Inpatient Hospital Stay (HOSPITAL_COMMUNITY): Payer: Medicare HMO

## 2018-08-21 ENCOUNTER — Encounter (HOSPITAL_COMMUNITY): Payer: Self-pay | Admitting: Diagnostic Radiology

## 2018-08-21 DIAGNOSIS — D649 Anemia, unspecified: Secondary | ICD-10-CM

## 2018-08-21 DIAGNOSIS — N185 Chronic kidney disease, stage 5: Secondary | ICD-10-CM

## 2018-08-21 DIAGNOSIS — R7989 Other specified abnormal findings of blood chemistry: Secondary | ICD-10-CM

## 2018-08-21 DIAGNOSIS — N186 End stage renal disease: Secondary | ICD-10-CM

## 2018-08-21 HISTORY — PX: IR FLUORO GUIDE CV LINE RIGHT: IMG2283

## 2018-08-21 HISTORY — PX: IR US GUIDE VASC ACCESS RIGHT: IMG2390

## 2018-08-21 LAB — BASIC METABOLIC PANEL
Anion gap: 14 (ref 5–15)
BUN: 136 mg/dL — ABNORMAL HIGH (ref 8–23)
CO2: 17 mmol/L — ABNORMAL LOW (ref 22–32)
Calcium: 8.3 mg/dL — ABNORMAL LOW (ref 8.9–10.3)
Chloride: 105 mmol/L (ref 98–111)
Creatinine, Ser: 17.79 mg/dL — ABNORMAL HIGH (ref 0.44–1.00)
GFR calc Af Amer: 2 mL/min — ABNORMAL LOW (ref >60)
GFR calc non Af Amer: 2 mL/min — ABNORMAL LOW (ref >60)
Glucose, Bld: 129 mg/dL — ABNORMAL HIGH (ref 70–99)
Potassium: 4.3 mmol/L (ref 3.5–5.1)
Sodium: 136 mmol/L (ref 135–145)

## 2018-08-21 LAB — IRON AND TIBC
Iron: 39 ug/dL (ref 28–170)
Saturation Ratios: 19 % (ref 10.4–31.8)
TIBC: 204 ug/dL — ABNORMAL LOW (ref 250–450)
UIBC: 165 ug/dL

## 2018-08-21 LAB — PROTIME-INR
INR: 1.1 (ref 0.8–1.2)
Prothrombin Time: 14 seconds (ref 11.4–15.2)

## 2018-08-21 LAB — TROPONIN I
Troponin I: 0.04 ng/mL (ref <0.03)
Troponin I: 0.04 ng/mL (ref <0.03)

## 2018-08-21 LAB — PHOSPHORUS: Phosphorus: 6.2 mg/dL — ABNORMAL HIGH (ref 2.5–4.6)

## 2018-08-21 LAB — FERRITIN: Ferritin: 349 ng/mL — ABNORMAL HIGH (ref 11–307)

## 2018-08-21 IMAGING — XA IR RIGHT FLUORO GUIDE CV LINE
1 series · 1 of 1 positions shown · non-contrast
Comparison: none

INDICATION: 72-year-old with end-stage renal disease and starting hemodialysis.

[Series 1: dr (person_name) · 1 of 1 slices shown]
[im 1/1]
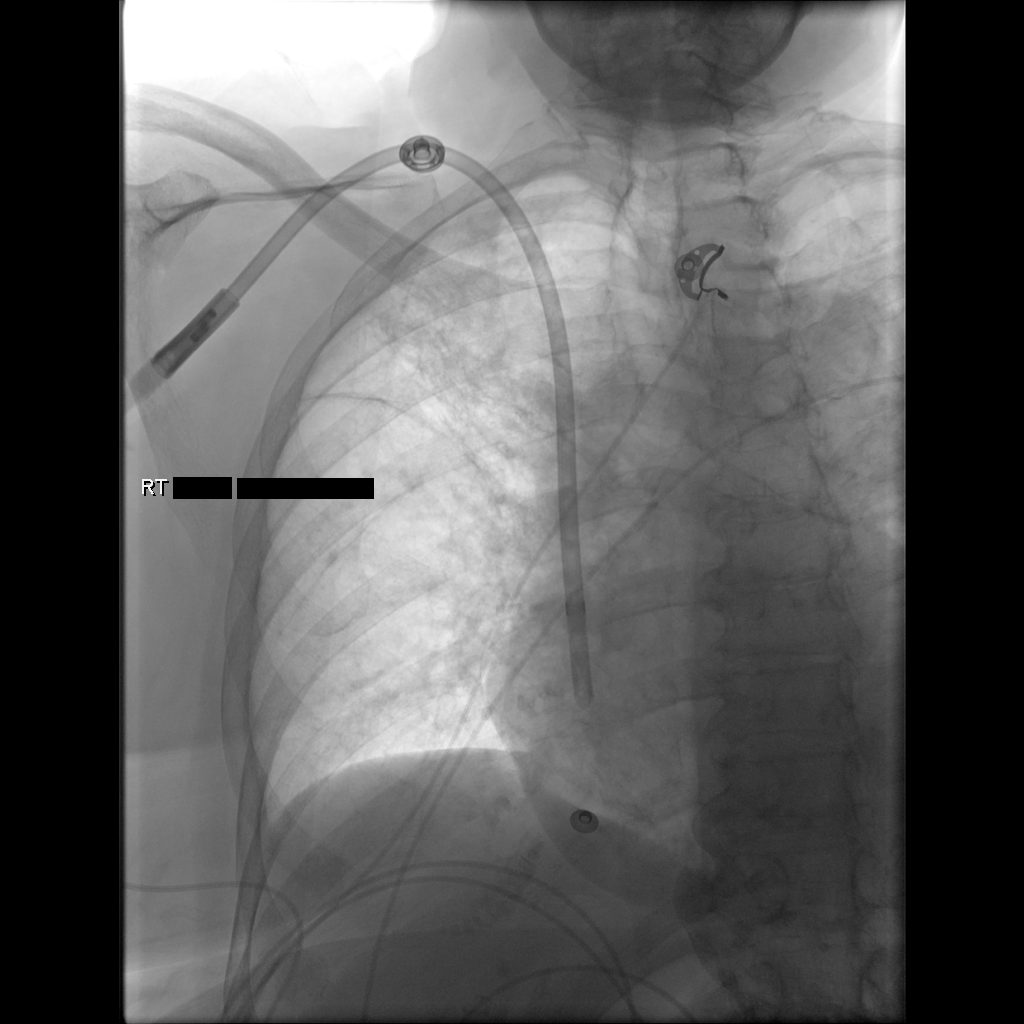

[1 of 1 positions shown; findings below may reference images not displayed]

EXAM:
FLUOROSCOPIC AND ULTRASOUND GUIDED PLACEMENT OF A TUNNELED DIALYSIS
CATHETER

MEDICATIONS:
Ancef 2 g; The antibiotic was administered within an appropriate
time interval prior to skin puncture.

ANESTHESIA/SEDATION:
Versed 2.0 mg IV; Fentanyl 100 mcg IV;

Moderate Sedation Time:  20 minutes

The patient was continuously monitored during the procedure by the
interventional radiology nurse under my direct supervision.

FLUOROSCOPY TIME:  Fluoroscopy Time: 36 seconds, 2 mGy

COMPLICATIONS:
None immediate.

PROCEDURE:
The procedure was explained to the patient. The risks and benefits
of the procedure were discussed and the patient's questions were
addressed. Informed consent was obtained from the patient. The
patient was placed supine on the interventional table. Ultrasound
confirmed a patent right internal jugular vein. Ultrasound images
were obtained for documentation. The right neck and chest was
prepped and draped in a sterile fashion. The right neck was
anesthetized with 1% lidocaine. Maximal barrier sterile technique
was utilized including caps, mask, sterile gowns, sterile gloves,
sterile drape, hand hygiene and skin antiseptic. A small incision
was made with #11 blade scalpel. A 21 gauge needle directed into the
right internal jugular vein with ultrasound guidance. A
micropuncture dilator set was placed. A 19 cm tip to cuff Palindrome
catheter was selected. The skin below the right clavicle was
anesthetized and a small incision was made with an #11 blade
scalpel. A subcutaneous tunnel was formed to the vein dermatotomy
site. The catheter was brought through the tunnel. The vein
dermatotomy site was dilated to accommodate a peel-away sheath. The
catheter was placed through the peel-away sheath and directed into
the central venous structures. The tip of the catheter was placed at
the SVC and right atrium junction with fluoroscopy. Fluoroscopic
images were obtained for documentation. Both lumens were found to
aspirate and flush well. The proper amount of heparin was flushed in
both lumens. The vein dermatotomy site was closed using a single
layer of absorbable suture and Dermabond. Gel-Foam was placed in the
subcutaneous tract. The catheter was secured to the skin using
Prolene suture.
IMPRESSION: Successful placement of a right jugular tunneled dialysis catheter
using ultrasound and fluoroscopic guidance.

## 2018-08-21 MED ORDER — LIDOCAINE HCL (PF) 1 % IJ SOLN
INTRAMUSCULAR | Status: AC | PRN
  Administered 2018-08-21: 15 mL

## 2018-08-21 MED ORDER — RENA-VITE PO TABS
1.0000 | ORAL_TABLET | Freq: Every day | ORAL | Status: DC
  Administered 2018-08-22 – 2018-08-26 (×6): 1 via ORAL
  Filled 2018-08-21 (×6): qty 1

## 2018-08-21 MED ORDER — MIDAZOLAM HCL 2 MG/2ML IJ SOLN
INTRAMUSCULAR | Status: AC
  Filled 2018-08-21: qty 2

## 2018-08-21 MED ORDER — GELATIN ABSORBABLE 12-7 MM EX MISC
CUTANEOUS | Status: AC
  Filled 2018-08-21: qty 1

## 2018-08-21 MED ORDER — SODIUM CHLORIDE 0.9 % IV SOLN
125.0000 mg | Freq: Once | INTRAVENOUS | Status: AC
  Administered 2018-08-21: 125 mg via INTRAVENOUS
  Filled 2018-08-21: qty 10

## 2018-08-21 MED ORDER — CEFAZOLIN SODIUM-DEXTROSE 2-4 GM/100ML-% IV SOLN
2.0000 g | Freq: Once | INTRAVENOUS | Status: AC
  Administered 2018-08-21: 12:00:00 2 g via INTRAVENOUS
  Filled 2018-08-21: qty 100

## 2018-08-21 MED ORDER — LIDOCAINE HCL 1 % IJ SOLN
INTRAMUSCULAR | Status: AC
  Filled 2018-08-21: qty 20

## 2018-08-21 MED ORDER — FENTANYL CITRATE (PF) 100 MCG/2ML IJ SOLN
INTRAMUSCULAR | Status: AC
  Filled 2018-08-21: qty 2

## 2018-08-21 MED ORDER — PRO-STAT SUGAR FREE PO LIQD
30.0000 mL | Freq: Two times a day (BID) | ORAL | Status: DC
  Administered 2018-08-21 – 2018-08-22 (×3): 30 mL via ORAL
  Filled 2018-08-21 (×11): qty 30

## 2018-08-21 MED ORDER — SEVELAMER CARBONATE 800 MG PO TABS
800.0000 mg | ORAL_TABLET | Freq: Three times a day (TID) | ORAL | Status: DC
  Administered 2018-08-21 – 2018-08-23 (×6): 800 mg via ORAL
  Filled 2018-08-21 (×7): qty 1

## 2018-08-21 MED ORDER — FENTANYL CITRATE (PF) 100 MCG/2ML IJ SOLN
INTRAMUSCULAR | Status: AC | PRN
  Administered 2018-08-21: 50 ug via INTRAVENOUS
  Administered 2018-08-21 (×2): 25 ug via INTRAVENOUS

## 2018-08-21 MED ORDER — CHLORHEXIDINE GLUCONATE CLOTH 2 % EX PADS
6.0000 | MEDICATED_PAD | Freq: Every day | CUTANEOUS | Status: DC
  Administered 2018-08-21 – 2018-08-24 (×3): 6 via TOPICAL

## 2018-08-21 MED ORDER — DARBEPOETIN ALFA 40 MCG/0.4ML IJ SOSY
40.0000 ug | PREFILLED_SYRINGE | Freq: Once | INTRAMUSCULAR | Status: AC
  Administered 2018-08-22: 40 ug via SUBCUTANEOUS
  Filled 2018-08-21 (×2): qty 0.4

## 2018-08-21 MED ORDER — HEPARIN SODIUM (PORCINE) 1000 UNIT/ML IJ SOLN
INTRAMUSCULAR | Status: AC
  Administered 2018-08-21: 3200 [IU]
  Filled 2018-08-21: qty 1

## 2018-08-21 MED ORDER — MIDAZOLAM HCL 2 MG/2ML IJ SOLN
INTRAMUSCULAR | Status: AC | PRN
  Administered 2018-08-21: 0.5 mg via INTRAVENOUS
  Administered 2018-08-21: 1 mg via INTRAVENOUS
  Administered 2018-08-21: 0.5 mg via INTRAVENOUS

## 2018-08-21 NOTE — Progress Notes (Addendum)
Initial Nutrition Assessment  RD working remotely.  DOCUMENTATION CODES:   Not applicable  INTERVENTION:   -D/c Nepro Shake po BID, each supplement provides 425 kcal and 19 grams protein -30 ml Prostat BID, each supplement provides 100 kcals and 15 grams protein -Renal MVI daily -Provided "Nutritional Guidelines for Adults Starting Hemodialysis" handout from [employer]; attached to AVS/discharge summary  NUTRITION DIAGNOSIS:   Increased nutrient needs related to chronic illness(ESRD on HD) as evidenced by estimated needs.  GOAL:   Patient will meet greater than or equal to 90% of their needs  MONITOR:   PO intake, Supplement acceptance, Labs, Weight trends, Skin, I & O's  REASON FOR ASSESSMENT:   Malnutrition Screening Tool    ASSESSMENT:   Bianca Myers  is a 73 y.o. female, with past medical history significant for chronic kidney disease stage V who is presenting to the emergency department for a syncopal episode earlier today.  That the patient was on her way to her nephrologist office when this happened.  After further questioning the patient reports similar episodes for the last 2 days and she had another episode in the lobby today.  Patient reports 1 year history of feeling weak and has history for chronic kidney disease stage V.  The patient was advised to start dialysis by her primary nephrologist but refused.  Patient denies any preceding chest pains palpitations, shortness of breath, nausea vomiting or diarrhea.  Pt admitted with syncope and acute on chronic renal failure (CKD stage V).   Reviewed I/O's: +690 ml x 24 hours  UOP: 550 ml x 24 hours  Per nephrology notes, plan for lt arm AV fistula on Tuesday (08/24/18). Vein mapping has already been ordered. Plan for temporary HD cath placement today with IR.   Attempted to speak with pt via phone, however, pt terminated call prior to RD being able to speak to pt. Unable to obtain further  nutrition-related history at this time.   Per meal completion records, PO 100%. Per nephrology notes, pt endorses a poor appetite for several week. No recent wt hx available to assess at this time.   Labs reviewed: Phos: 6.2.   NUTRITION - FOCUSED PHYSICAL EXAM:    Most Recent Value  Orbital Region  Unable to assess  Upper Arm Region  Unable to assess  Thoracic and Lumbar Region  Unable to assess  Buccal Region  Unable to assess  Temple Region  Unable to assess  Clavicle Bone Region  Unable to assess  Clavicle and Acromion Bone Region  Unable to assess  Scapular Bone Region  Unable to assess  Dorsal Hand  Unable to assess  Patellar Region  Unable to assess  Anterior Thigh Region  Unable to assess  Posterior Calf Region  Unable to assess  Edema (RD Assessment)  Unable to assess  Hair  Unable to assess  Eyes  Unable to assess  Mouth  Unable to assess  Skin  Unable to assess  Nails  Unable to assess       Diet Order:   Diet Order            Diet NPO time specified  Diet effective now              EDUCATION NEEDS:   No education needs have been identified at this time  Skin:  Skin Assessment: Reviewed RN Assessment  Last BM:  08/20/18  Height:   Ht Readings from Last 1 Encounters:  12/10/15 5\' 7"  (  1.702 m)    Weight:   Wt Readings from Last 1 Encounters:  08/21/18 78.2 kg    Ideal Body Weight:  61.4 kg  BMI:  Body mass index is 27.02 kg/m.  Estimated Nutritional Needs:   Kcal:  1850-2050  Protein:  80-95 grams  Fluid:  1000 ml + UOP    Araf Clugston A. Jimmye Norman, RD, LDN, [place] Registered Dietitian II Certified Diabetes Care and Education Specialist Pager: [phone] After hours Pager: [phone]

## 2018-08-21 NOTE — Consult Note (Addendum)
Hospital Consult    Reason for Consult:  New AVF access Referring Physician:  Nephrology, Dr. Maryjane Hurter MRN #:  [id number]  History of Present Illness: This is a 73 y.o. female with history of stage V chronic kidney disease, hypertension, hyperlipidemia, and diabetes that vascular surgery has been consulted for new AV fistula access.  Patient was reportedly admitted from her nephrology office yesterday after syncopal episode.  Her creatinine was found to be 17.  Increasing metabolic acidosis.  She has not started dialysis yet.  She denies any previous catheters and/or upper extremity AV fistula access in the past.  She is right-handed.  States she is retired.  No numbness, tingling, weakness in either hand.  She was seen by Dr. Bridgett Larsson in 2017 with plan for left brachiocephalic AVF, but that was never placed.  Past Medical History:  Diagnosis Date  . Chronic kidney disease (CKD) stage G5/A1, glomerular filtration rate (GFR) less than or equal to 15 mL/min/1.73 square meter and albuminuria creatinine ratio less than 30 mg/g (HCC)   . Diabetes mellitus ([place])   . Gout   . History of anemia due to chronic kidney disease   . History of degenerative disc disease   . Hypertension   . Secondary hyperparathyroidism ([place])   . Spondylisthesis     Past Surgical History:  Procedure Laterality Date  . HIP SURGERY      No Known Allergies  Prior to Admission medications   Medication Sig Start Date End Date Taking? Authorizing Provider  acetaminophen (TYLENOL) 325 MG tablet Take 650 mg by mouth every 6 (six) hours as needed for headache.   Yes [provider]  Ascorbic Acid (VITAMIN C) 1000 MG tablet Take 1,000 mg by mouth 2 (two) times a day.    Yes [provider]  atorvastatin (LIPITOR) 10 MG tablet Take 10 mg by mouth at bedtime. 05/14/18  Yes [provider]  Biotin (BIOTIN 5000) 5 MG CAPS Take 5,000 capsules by mouth at bedtime.    Yes [provider]   DULoxetine (CYMBALTA) 60 MG capsule Take 60 mg by mouth at bedtime.    Yes [provider]  furosemide (LASIX) 40 MG tablet Take 40 mg by mouth 2 (two) times daily.    Yes [provider]  labetalol (NORMODYNE) 200 MG tablet Take 200 mg by mouth 2 (two) times daily.   Yes [provider]  NIFEdipine (PROCARDIA-XL/ADALAT CC) 30 MG 24 hr tablet Take 30 mg by mouth at bedtime.    Yes [provider]  Omega-3 Fatty Acids (FISH OIL) 1000 MG CAPS Take by mouth.   Yes [provider]  sodium bicarbonate 650 MG tablet Take 650 mg by mouth 2 (two) times daily.    Yes [provider]  Turmeric 500 MG CAPS Take 500 mg by mouth daily.    Yes [provider]    Social History   Socioeconomic History  . Marital status: Widowed    Spouse name: Not on file  . Number of children: Not on file  . Years of education: Not on file  . Highest education level: Not on file  Occupational History  . Not on file  Social Needs  . Financial resource strain: Not on file  . Food insecurity    Worry: Not on file    Inability: Not on file  . Transportation needs    Medical: Not on file    Non-medical: Not on file  Tobacco Use  .  Smoking status: Former Smoker    Quit date: 1973    Years since quitting: 47.4  . Smokeless tobacco: Never Used  Substance and Sexual Activity  . Alcohol use: Not on file  . Drug use: Not on file  . Sexual activity: Not on file  Lifestyle  . Physical activity    Days per week: Not on file    Minutes per session: Not on file  . Stress: Not on file  Relationships  . Social Herbalist on phone: Not on file    Gets together: Not on file    Attends religious service: Not on file    Active member of club or organization: Not on file    Attends meetings of clubs or organizations: Not on file    Relationship status: Not on file  . Intimate partner violence    Fear of current or ex partner: Not on file     Emotionally abused: Not on file    Physically abused: Not on file    Forced sexual activity: Not on file  Other Topics Concern  . Not on file  Social History Narrative  . Not on file     Family History  Family history unknown: Yes    ROS: [x]  Positive   [ ]  Negative   [ ]  All sytems reviewed and are negative  Cardiovascular: []  chest pain/pressure []  palpitations []  SOB lying flat [x]  DOE []  pain in legs while walking []  pain in legs at rest []  pain in legs at night []  non-healing ulcers []  hx of DVT []  swelling in legs  Pulmonary: []  productive cough []  asthma/wheezing []  home O2  Neurologic: []  weakness in []  arms []  legs []  numbness in []  arms []  legs []  hx of CVA []  mini stroke [] difficulty speaking or slurred speech []  temporary loss of vision in one eye []  dizziness  Hematologic: []  hx of cancer []  bleeding problems []  problems with blood clotting easily  Endocrine:   []  diabetes []  thyroid disease  GI []  vomiting blood []  blood in stool  GU: []  CKD/renal failure []  HD--[]  M/W/F or []  T/T/S []  burning with urination []  blood in urine  Psychiatric: []  anxiety []  depression  Musculoskeletal: []  arthritis []  joint pain  Integumentary: []  rashes []  ulcers  Constitutional: []  fever []  chills   Physical Examination  Vitals:   08/20/18 2047 08/21/18 0608  BP: (!) 149/84 136/66  Pulse: 66 72  Resp: 18 18  Temp: 98.6 F (37 C) 98.2 F (36.8 C)  SpO2: 96% 100%   Body mass index is 27.02 kg/m.  General:  WDWN in NAD Gait: Not observed HENT: WNL, normocephalic Pulmonary: normal non-labored breathing, without Rales, rhonchi,  wheezing Cardiac: regular, without  Murmurs, rubs or gallops Abdomen: soft, NT/ND, no masses Skin: without rashes Vascular Exam/Pulses:  Right Left  Radial 2+ (normal) 2+ (normal)  Brachial 2+ (normal) 2+ (normal)  Femoral    Popliteal    DP    PT     Extremities: without ischemic changes, without  Gangrene , without cellulitis; without open wounds;  Musculoskeletal: no muscle wasting or atrophy  Neurologic: A&O X 3; Appropriate Affect ; SENSATION: normal; MOTOR FUNCTION:  moving all extremities equally. Speech is fluent/normal   CBC    Component Value Date/Time   WBC 7.0 08/20/2018 1324   RBC 2.59 (L) 08/20/2018 1324   HGB 7.0 (L) 08/20/2018 1324   HCT 22.8 (L) 08/20/2018 1324  PLT 350 08/20/2018 1324   MCV 88.0 08/20/2018 1324   MCH 27.0 08/20/2018 1324   MCHC 30.7 08/20/2018 1324   RDW 17.5 (H) 08/20/2018 1324   LYMPHSABS 0.9 08/20/2018 1324   MONOABS 0.4 08/20/2018 1324   EOSABS 0.2 08/20/2018 1324   BASOSABS 0.0 08/20/2018 1324    BMET    Component Value Date/Time   NA 136 08/21/2018 0113   K 4.3 08/21/2018 0113   CL 105 08/21/2018 0113   CO2 17 (L) 08/21/2018 0113   GLUCOSE 129 (H) 08/21/2018 0113   BUN 136 (H) 08/21/2018 0113   CREATININE 17.79 (H) 08/21/2018 0113   CALCIUM 8.3 (L) 08/21/2018 0113   GFRNONAA 2 (L) 08/21/2018 0113   GFRAA 2 (L) 08/21/2018 0113    COAGS: No results found for: INR, PROTIME   Non-Invasive Vascular Imaging:    Bilateral upper extremity vein mapping is pending   ASSESSMENT/PLAN: This is a 74 y.o. female with stage V chronic kidney disease and progressing metabolic acidosis that vascular surgery has been asked to evaluate for permanent hemodialysis access.  Tentative plan is for left arm AV fistula on Tuesday given patient is right-handed.  We have ordered updated vein mapping given that her last vein mapping was done 2.5 years ago.  I discussed details of surgery with her as well as all the risks and benefits.  Dr. Maryjane Hurter states that they have consulted IR already today for temporary catheter placement.  Marty Heck, MD [facility] Office: [phone] Pager: [place]

## 2018-08-21 NOTE — Progress Notes (Signed)
[facility]    NEPHROLOGY PROGRESS NOTE  SUBJECTIVE: Patient feels somewhat better this morning.  Patient seen and examined.  Denies any fevers, chills, chest pain, shortness of breath, dysuria, hematuria, or myalgias.  Denies any nausea.  Was able to eat breakfast without difficulty.  All other review of systems are negative.   OBJECTIVE:  Vitals:   08/20/18 2047 08/21/18 0608  BP: (!) 149/84 136/66  Pulse: 66 72  Resp: 18 18  Temp: 98.6 F (37 C) 98.2 F (36.8 C)  SpO2: 96% 100%    Intake/Output Summary (Last 24 hours) at 08/21/2018 0943 Last data filed at 08/21/2018 7425 Gross per 24 hour  Intake 1240 ml  Output 550 ml  Net 690 ml      General:  AAOx3 NAD HEENT: MMM  AT anicteric sclera Neck:  No JVD, no adenopathy CV:  Heart RRR  Lungs:  L/S CTA bilaterally Abd:  abd SNT/ND with normal BS GU:  Bladder non-palpable Extremities:  No LE edema. Skin:  No skin rash  MEDICATIONS:  . atorvastatin  10 mg Oral QHS  . DULoxetine  60 mg Oral QHS  . feeding supplement (NEPRO CARB STEADY)  237 mL Oral BID BM  . labetalol  200 mg Oral BID  . NIFEdipine  30 mg Oral QHS  . sodium bicarbonate  650 mg Oral BID     LABS:   CBC Latest Ref Rng & Units 08/20/2018  WBC 4.0 - 10.5 K/uL 7.0  Hemoglobin 12.0 - 15.0 g/dL 7.0(L)  Hematocrit 36.0 - 46.0 % 22.8(L)  Platelets 150 - 400 K/uL 350    CMP Latest Ref Rng & Units 08/21/2018 08/20/2018  Glucose 70 - 99 mg/dL 129(H) 165(H)  BUN 8 - 23 mg/dL 136(H) 138(H)  Creatinine 0.44 - 1.00 mg/dL 17.79(H) 18.80(H)  Sodium 135 - 145 mmol/L 136 134(L)  Potassium 3.5 - 5.1 mmol/L 4.3 4.8  Chloride 98 - 111 mmol/L 105 98  CO2 22 - 32 mmol/L 17(L) 18(L)  Calcium 8.9 - 10.3 mg/dL 8.3(L) 9.2  Total Protein 6.5 - 8.1 g/dL - 6.3(L)  Total Bilirubin 0.3 - 1.2 mg/dL - 0.8  Alkaline Phos 38 - 126 U/L - 47  AST 15 - 41 U/L - 9(L)  ALT 0 - 44 U/L - 10    Lab Results  Component Value Date   CALCIUM 8.3 (L) 08/21/2018   PHOS  6.2 (H) 08/21/2018       Component Value Date/Time   COLORURINE YELLOW 08/20/2018 2040   APPEARANCEUR HAZY (A) 08/20/2018 2040   LABSPEC 1.012 08/20/2018 2040   PHURINE 5.0 08/20/2018 2040   GLUCOSEU NEGATIVE 08/20/2018 2040   HGBUR SMALL (A) 08/20/2018 2040   BILIRUBINUR NEGATIVE 08/20/2018 2040   KETONESUR NEGATIVE 08/20/2018 2040   PROTEINUR 100 (A) 08/20/2018 2040   NITRITE NEGATIVE 08/20/2018 2040   LEUKOCYTESUR LARGE (A) 08/20/2018 2040   No results found for: PHART, PCO2ART, PO2ART, HCO3, TCO2, ACIDBASEDEF, O2SAT     Component Value Date/Time   IRON 39 08/21/2018 0113   TIBC 204 (L) 08/21/2018 0113   FERRITIN 349 (H) 08/21/2018 0113   IRONPCTSAT 19 08/21/2018 0113       ASSESSMENT/PLAN:      The patient is a 73 y.o. year old female patient with a past medical history significant for stage V chronic kidney disease who presented from her appointment with Dr. Justin Mend after 2 episodes of syncope in the lobby.  She reports feeling weak for quite some  time.  In the emergency department, she was noted to have a creatinine of 18.8.   1.  Chronic kidney disease stage V.  Her renal function has significantly worsened.  With the syncope, it certainly suggest some component of volume depletion.  Outpatient creatinine was 6.9 in October 2019.  Has had some improvement with IV fluids, but given review of outpatient creatinine, will not likely have significant return of renal function.  Will plan for dialysis initiation.  Made n.p.o. now, in anticipation of hopeful permacath today.  2.  Anemia.  Hemoccult was negative.  Likely secondary to chronic kidney disease.    Will add Aranesp and IV iron for low iron sat.  3.  Syncope.  Suspect hemodynamic in nature.  Stable hemodynamics today.  4.  Hypertension.  Blood pressure currently is stable.  Will continue current therapy.  5.  Metabolic acidosis.  Continue sodium bicarbonate.  Will likely be able to manage with dialysis.  6.   Metabolic bone disease.  Check intact PTH.  Phosphorus elevated at 6.2.  We will add phosphate binder.    [place], DO, [place]

## 2018-08-21 NOTE — Progress Notes (Signed)
Patient down to HD.

## 2018-08-21 NOTE — Progress Notes (Addendum)
PROGRESS NOTE    Bianca Myers  ACZ:[id number] DOB: 07-01-1945 DOA: 08/20/2018 PCP: [name], MD    Brief Narrative:72 y.o. female, with past medical history significant for chronic kidney disease stage V who is presenting to the emergency department for a syncopal episode earlier today.  That the patient was on her way to her nephrologist office when this happened.  After further questioning the patient reports similar episodes for the last 2 days and she had another episode in the lobby today.  Patient reports 1 year history of feeling weak and has history for chronic kidney disease stage V.  The patient was advised to start dialysis by her primary nephrologist but refused.  Patient denies any preceding chest pains palpitations, shortness of breath, nausea vomiting or diarrhea. In the emergency room patient's troponin was slightly elevated at 0.04 with a creatinine of 18.8, potassium 4.8.  Her hemoglobin was 7.  Her fecal occult blood was negative .  CT of the head was negative for acute events but small lacunar infarcts in the right basal ganglia were noted with age-related cerebral atrophy.   Assessment & Plan:   Active Problems:   Syncope   #1  chronic kidney disease stage V with metabolic acidosis creatinine bumped up to 17 planning for dialysis.  Permacath to be placed today.  Patient sees Dr. Justin Mend as an outpatient.  She is agreeable to dialysis if needed.  Continue sodium bicarbonate.  #2 syncope possibly secondary to intravascular depletion  #3 elevated troponin no acute EKG changes most likely secondary to CKD  #4 hypertension continue labetalol and Procardia  #5 anxiety and depression continue Cymbalta  #6 hyperlipidemia continue statin  #7 anemia of chronic disease most likely secondary to CKD with low iron 39.  FOBT negative.  Hemoglobin 7 from yesterday's lab.  Getting Aranesp and IV iron.    Estimated body mass index is 27.02 kg/m as calculated from the  following:   Height as of 12/10/15: 5\' 7"  (1.702 m).   Weight as of this encounter: 78.2 kg.    Subjective:  Patient resting in bed flat reports she still makes some urine but much less compared to past.  Complains of generalized weakness. Objective: Vitals:   08/20/18 1630 08/20/18 1735 08/20/18 2047 08/21/18 0608  BP: (!) 162/88 (!) 146/75 (!) 149/84 136/66  Pulse: 70 74 66 72  Resp: (!) 21  18 18   Temp:  97.6 F (36.4 C) 98.6 F (37 C) 98.2 F (36.8 C)  TempSrc:  Oral Oral Oral  SpO2: 100% 100% 96% 100%  Weight:    78.2 kg    Intake/Output Summary (Last 24 hours) at 08/21/2018 1029 Last data filed at 08/21/2018 0619 Gross per 24 hour  Intake 1240 ml  Output 550 ml  Net 690 ml   Filed Weights   08/21/18 0608  Weight: 78.2 kg    Examination:  General exam: Appears calm and comfortable  Respiratory system: Decreased breath sounds at the bases to auscultation. Respiratory effort normal. Cardiovascular system: S1 & S2 heard, RRR. No JVD, murmurs, rubs, gallops or clicks. No pedal edema. Gastrointestinal system: Abdomen is nondistended, soft and nontender. No organomegaly or masses felt. Normal bowel sounds heard. Central nervous system: Alert and oriented. No focal neurological deficits. Extremities: Trace edema bi laterally Skin: No rashes, lesions or ulcers Psychiatry: Judgement and insight appear normal. Mood & affect appropriate.     Data Reviewed: I have personally reviewed following labs and imaging studies  CBC:  Recent Labs  Lab 08/20/18 1324  WBC 7.0  NEUTROABS 5.5  HGB 7.0*  HCT 22.8*  MCV 88.0  PLT 174   Basic Metabolic Panel: Recent Labs  Lab 08/20/18 1324 08/21/18 0113  NA 134* 136  K 4.8 4.3  CL 98 105  CO2 18* 17*  GLUCOSE 165* 129*  BUN 138* 136*  CREATININE 18.80* 17.79*  CALCIUM 9.2 8.3*  PHOS  --  6.2*   GFR: CrCl cannot be calculated (Unknown ideal weight.). Liver Function Tests: Recent Labs  Lab 08/20/18 1324  AST 9*    ALT 10  ALKPHOS 47  BILITOT 0.8  PROT 6.3*  ALBUMIN 3.3*   No results for input(s): LIPASE, AMYLASE in the last 168 hours. No results for input(s): AMMONIA in the last 168 hours. Coagulation Profile: No results for input(s): INR, PROTIME in the last 168 hours. Cardiac Enzymes: Recent Labs  Lab 08/20/18 1324 08/20/18 1910 08/21/18 0113 08/21/18 0827  TROPONINI 0.04* 0.03* 0.04* 0.04*   BNP (last 3 results) No results for input(s): PROBNP in the last 8760 hours. HbA1C: No results for input(s): HGBA1C in the last 72 hours. CBG: No results for input(s): GLUCAP in the last 168 hours. Lipid Profile: No results for input(s): CHOL, HDL, LDLCALC, TRIG, CHOLHDL, LDLDIRECT in the last 72 hours. Thyroid Function Tests: No results for input(s): TSH, T4TOTAL, FREET4, T3FREE, THYROIDAB in the last 72 hours. Anemia Panel: Recent Labs    08/21/18 0113  FERRITIN 349*  TIBC 204*  IRON 39   Sepsis Labs: No results for input(s): PROCALCITON, LATICACIDVEN in the last 168 hours.  Recent Results (from the past 240 hour(s))  SARS Coronavirus 2     Status: None   Collection Time: 08/20/18  3:11 PM  Result Value Ref Range Status   SARS Coronavirus 2 NOT DETECTED NOT DETECTED Final    Comment: (NOTE) SARS-CoV-2 target nucleic acids are NOT DETECTED. The SARS-CoV-2 RNA is generally detectable in upper and lower respiratory specimens during the acute phase of infection.  Negative  results do not preclude SARS-CoV-2 infection, do not rule out co-infections with other pathogens, and should not be used as the sole basis for treatment or other patient management decisions.  Negative results must be combined with clinical observations, patient history, and epidemiological information. The expected result is Not Detected. Fact Sheet for Patients: http://www.biofiredefense.com/wp-content/uploads/2020/03/BIOFIRE-COVID -19-patients.pdf Fact Sheet for Healthcare  Providers: http://www.biofiredefense.com/wp-content/uploads/2020/03/BIOFIRE-COVID -19-hcp.pdf This test is not yet approved or cleared by the [place] and  has been authorized for detection and/or diagnosis of SARS-CoV-2 by FDA under an Emergency Use Authorization (EUA).  This EUA will remain in effec t (meaning this test can be used) for the duration of  the COVID-19 declaration under Section 564(b)(1) of the Act, 21 U.S.C. section 360bbb-3(b)(1), unless the authorization is terminated or revoked sooner. Performed at [facility], [place] [address], [place], [place] [address]          Radiology Studies: Dg Chest 2 View  Result Date: 08/20/2018 CLINICAL DATA:  Syncope EXAM: CHEST - 2 VIEW COMPARISON:  10/31/2013 FINDINGS: Scarring and fibrosis within the right greater than left upper lobe as before. No acute consolidation or effusion. Stable cardiomediastinal silhouette with unfolded aorta. No pneumothorax. Degenerative changes of the spine. IMPRESSION: No active cardiopulmonary disease. Stable right greater than left upper lobe scarring and fibrosis. Electronically Signed   By: Donavan Foil M.D.   On: 08/20/2018 14:49   Ct Head Wo Contrast  Result Date:  08/20/2018 CLINICAL DATA:  Syncopal episode. EXAM: CT HEAD WITHOUT CONTRAST TECHNIQUE: Contiguous axial images were obtained from the base of the skull through the vertex without intravenous contrast. COMPARISON:  None. FINDINGS: Brain: The ventricles are in the midline without mass effect or shift. No extra-axial fluid collections are identified. Age related cerebral atrophy, ventriculomegaly and periventricular white matter disease. Remote appearing lacunar type right basal ganglia infarcts are noted. No CT findings for acute hemispheric infarction or intracranial hemorrhage. No mass lesions. The brainstem and cerebellum are unremarkable. 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 Vascular: Vascular calcifications but no definite  aneurysm or hyperdense vessels. Skull: No skull fracture or bone lesions. Sinuses/Orbits: The paranasal sinuses and mastoid air cells are clear. The globes are intact. Other: No scalp lesions or hematoma. IMPRESSION: 1. No CT findings for an acute intracranial process. 2. 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 3. Small midline lipomas. 4. Age related cerebral atrophy, ventriculomegaly and periventricular white matter disease. Electronically Signed   By: Marijo Sanes M.D.   On: 08/20/2018 14:22        Scheduled Meds:  atorvastatin  10 mg Oral QHS   Chlorhexidine Gluconate Cloth  6 each Topical Q0600   darbepoetin (ARANESP) injection - NON-DIALYSIS  40 mcg Subcutaneous Once   DULoxetine  60 mg Oral QHS   feeding supplement (NEPRO CARB STEADY)  237 mL Oral BID BM   labetalol  200 mg Oral BID   NIFEdipine  30 mg Oral QHS   sevelamer carbonate  800 mg Oral TID WC   sodium bicarbonate  650 mg Oral BID   Continuous Infusions:  sodium chloride 100 mL/hr at 08/21/18 2924   ferric gluconate (FERRLECIT/NULECIT) IV       LOS: 1 day      Georgette Shell, MD [facility]  If 7PM-7AM, please contact night-coverage [email or web address] Password [facility] 08/21/2018, 10:29 AM

## 2018-08-21 NOTE — Consult Note (Signed)
Chief Complaint: Patient was seen in consultation today for ESRD/tunneled HD catheter placement.  Referring Physician(s): [employer], Jerrye Beavers  Supervising Physician: Markus Daft  Patient Status: [facility] - In-pt  History of Present Illness: Bianca Myers is a 73 y.o. female with a past medical history of hypertension, ESRD, diabetes mellitus, secondary hyperparathyroidism, anemia, DDD, and gout. She presented to [facility] ED 08/20/2018 after a syncopal episode that occurred earlier that day. In ED, Cr 18.8. She was admitted for further management of renal insufficiency. Nephrology was consulted who recommended initiation of HD and IR consultation for possible tunneled HD catheter placement.  IR consulted by Dr. Grayland Ormond for possible image-guided tunneled HD catheter placement. Patient awake and alert laying in bed with no complaints at this time. Denies fever, chills, chest pain, dyspnea, abdominal pain, or headache.   Past Medical History:  Diagnosis Date  . Chronic kidney disease (CKD) stage G5/A1, glomerular filtration rate (GFR) less than or equal to 15 mL/min/1.73 square meter and albuminuria creatinine ratio less than 30 mg/g (HCC)   . Diabetes mellitus ([place])   . Gout   . History of anemia due to chronic kidney disease   . History of degenerative disc disease   . Hypertension   . Secondary hyperparathyroidism ([place])   . Spondylisthesis     Past Surgical History:  Procedure Laterality Date  . HIP SURGERY      Allergies: Patient has no known allergies.  Medications: Prior to Admission medications   Medication Sig Start Date End Date Taking? Authorizing Provider  acetaminophen (TYLENOL) 325 MG tablet Take 650 mg by mouth every 6 (six) hours as needed for headache.   Yes [provider]  Ascorbic Acid (VITAMIN C) 1000 MG tablet Take 1,000 mg by mouth 2 (two) times a day.    Yes [provider]  atorvastatin (LIPITOR) 10 MG tablet Take 10 mg by mouth at bedtime.  05/14/18  Yes [provider]  Biotin (BIOTIN 5000) 5 MG CAPS Take 5,000 capsules by mouth at bedtime.    Yes [provider]  DULoxetine (CYMBALTA) 60 MG capsule Take 60 mg by mouth at bedtime.    Yes [provider]  furosemide (LASIX) 40 MG tablet Take 40 mg by mouth 2 (two) times daily.    Yes [provider]  labetalol (NORMODYNE) 200 MG tablet Take 200 mg by mouth 2 (two) times daily.   Yes [provider]  NIFEdipine (PROCARDIA-XL/ADALAT CC) 30 MG 24 hr tablet Take 30 mg by mouth at bedtime.    Yes [provider]  Omega-3 Fatty Acids (FISH OIL) 1000 MG CAPS Take by mouth.   Yes [provider]  sodium bicarbonate 650 MG tablet Take 650 mg by mouth 2 (two) times daily.    Yes [provider]  Turmeric 500 MG CAPS Take 500 mg by mouth daily.    Yes [provider]     Family History  Family history unknown: Yes    Social History   Socioeconomic History  . Marital status: Widowed    Spouse name: Not on file  . Number of children: Not on file  . Years of education: Not on file  . Highest education level: Not on file  Occupational History  . Not on file  Social Needs  . Financial resource strain: Not on file  . Food insecurity    Worry: Not on file    Inability: Not on file  . Transportation needs  Medical: Not on file    Non-medical: Not on file  Tobacco Use  . Smoking status: Former Smoker    Quit date: 1973    Years since quitting: 47.4  . Smokeless tobacco: Never Used  Substance and Sexual Activity  . Alcohol use: Not on file  . Drug use: Not on file  . Sexual activity: Not on file  Lifestyle  . Physical activity    Days per week: Not on file    Minutes per session: Not on file  . Stress: Not on file  Relationships  . Social Herbalist on phone: Not on file    Gets together: Not on file    Attends religious service: Not on file    Active member of club or  organization: Not on file    Attends meetings of clubs or organizations: Not on file    Relationship status: Not on file  Other Topics Concern  . Not on file  Social History Narrative  . Not on file     Review of Systems: A 12 point ROS discussed and pertinent positives are indicated in the HPI above.  All other systems are negative.  Review of Systems  Constitutional: Negative for chills and fever.  Respiratory: Negative for shortness of breath and wheezing.   Cardiovascular: Negative for chest pain and palpitations.  Gastrointestinal: Negative for abdominal pain.  Neurological: Negative for headaches.  Psychiatric/Behavioral: Negative for behavioral problems and confusion.    Vital Signs: BP 136/66 (BP Location: Right Arm)   Pulse 72   Temp 98.2 F (36.8 C) (Oral)   Resp 18   Wt 172 lb 8 oz (78.2 kg)   SpO2 100%   BMI 27.02 kg/m   Physical Exam Vitals signs and nursing note reviewed.  Constitutional:      General: She is not in acute distress.    Appearance: Normal appearance.  Cardiovascular:     Rate and Rhythm: Normal rate and regular rhythm.     Heart sounds: Normal heart sounds. No murmur.  Pulmonary:     Effort: Pulmonary effort is normal. No respiratory distress.     Breath sounds: Normal breath sounds. No wheezing.  Skin:    General: Skin is warm and dry.  Neurological:     Mental Status: She is alert and oriented to person, place, and time.  Psychiatric:        Mood and Affect: Mood normal.        Behavior: Behavior normal.        Thought Content: Thought content normal.        Judgment: Judgment normal.      MD Evaluation Airway: WNL Heart: WNL Abdomen: WNL Chest/ Lungs: WNL ASA  Classification: 3 Mallampati/Airway Score: Two   Imaging: Dg Chest 2 View  Result Date: 08/20/2018 CLINICAL DATA:  Syncope EXAM: CHEST - 2 VIEW COMPARISON:  10/31/2013 FINDINGS: Scarring and fibrosis within the right greater than left upper lobe as before. No  acute consolidation or effusion. Stable cardiomediastinal silhouette with unfolded aorta. No pneumothorax. Degenerative changes of the spine. IMPRESSION: No active cardiopulmonary disease. Stable right greater than left upper lobe scarring and fibrosis. Electronically Signed   By: Donavan Foil M.D.   On: 08/20/2018 14:49   Ct Head Wo Contrast  Result Date: 08/20/2018 CLINICAL DATA:  Syncopal episode. EXAM: CT HEAD WITHOUT CONTRAST TECHNIQUE: Contiguous axial images were obtained from the base of the skull through the vertex without intravenous contrast. COMPARISON:  None. FINDINGS: Brain: The ventricles are in the midline without mass effect or shift. No extra-axial fluid collections are identified. Age related cerebral atrophy, ventriculomegaly and periventricular white matter disease. Remote appearing lacunar type right basal ganglia infarcts are noted. No CT findings for acute hemispheric infarction or intracranial hemorrhage. No mass lesions. The brainstem and cerebellum are unremarkable. 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 Vascular: Vascular calcifications but no definite aneurysm or hyperdense vessels. Skull: No skull fracture or bone lesions. Sinuses/Orbits: The paranasal sinuses and mastoid air cells are clear. The globes are intact. Other: No scalp lesions or hematoma. IMPRESSION: 1. No CT findings for an acute intracranial process. 2. 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 3. Small midline lipomas. 4. Age related cerebral atrophy, ventriculomegaly and periventricular white matter disease. Electronically Signed   By: Marijo Sanes M.D.   On: 08/20/2018 14:22    Labs:  CBC: Recent Labs    08/20/18 1324  WBC 7.0  HGB 7.0*  HCT 22.8*  PLT 350    COAGS: No results for input(s): INR, APTT in the last 8760 hours.  BMP: Recent Labs    08/20/18 1324 08/21/18 0113  NA 134* 136  K 4.8 4.3  CL 98 105  CO2 18* 17*  GLUCOSE 165* 129*  BUN 138* 136*   CALCIUM 9.2 8.3*  CREATININE 18.80* 17.79*  GFRNONAA 2* 2*  GFRAA 2* 2*    LIVER FUNCTION TESTS: Recent Labs    08/20/18 1324  BILITOT 0.8  AST 9*  ALT 10  ALKPHOS 47  PROT 6.3*  ALBUMIN 3.3*     Assessment and Plan:  ESRD in need of HD. Plan for image-guided tunneled HD catheter placement today with Dr. Anselm Pancoast. Patient is NPO. Afebrile and WBCs WNL. Heparin held per IR protocol. INR pending.  Risks and benefits discussed with the patient including, but not limited to bleeding, infection, vascular injury, pneumothorax which may require chest tube placement, air embolism or even death. All of the patient's questions were answered, patient is agreeable to proceed. Consent signed and in chart.   Thank you for this interesting consult.  I greatly enjoyed meeting Bianca Myers and look forward to participating in their care.  A copy of this report was sent to the requesting provider on this date.  Electronically Signed: Earley Abide, PA-C 08/21/2018, 11:36 AM   I spent a total of 40 Minutes in face to face in clinical consultation, greater than 50% of which was counseling/coordinating care for ESRD/tunneled HD catheter placement.

## 2018-08-21 NOTE — Procedures (Signed)
Interventional Radiology Procedure:   Indications: ESRD and starting HD  Procedure: Tunneled HD catheter placement  Findings: Right jugular catheter (19 cm Palindrome), tip at SVC/RA junction  Complications: None     EBL: Minimal, less than 10 ml  Plan: HD catheter is ready to use.       Garth Diffley R. Anselm Pancoast, MD  Pager: [phone]

## 2018-08-21 NOTE — Discharge Instructions (Signed)
Nutritional Guidelines for Patients Starting Hemodialysis  Now that you are beginning hemodialysis, there may be many changes in your daily life. Your doctor has probably told you that you may need to make some changes in your diet.  How well you feel will depend on:  Eating the right kind and amounts of food.  Having the hemodialysis treatments your health professional orders for you  Taking the medicines your health professional orders for you. Your diet is an important part of your treatment. Your kidneys cannot get rid of enough waste products and fluids from your blood and your body now has special needs. Therefore, you will need to limit fluids and change your intake of certain foods in your diet. The kidney dietitian at your dialysis center will help you plan a diet for your special needs.  Use this brochure as a guide until your dietitian prepares a personalized meal plan for you. You will need to:  Eat more high protein foods.  Eat less high salt, high potassium, and high phosphorus foods.  Learn how much fluid you can safely drink (including coffee, tea, water, and any food that is liquid at room temperature). Salt & Sodium Use less salt and eat fewer salty foods: This may help to control blood pressure. It may also help reduce fluid weight gains between dialysis sessions since salt increases thirst and causes the body to retain (or hold on to) fluid.  Use herbs, spices, and low-salt flavor enhancers in place of salt  Avoid salt substitutes made with potassium. Meat/Protein People on dialysis need to eat more protein. Protein can help keep healthy blood protein levels and improve health. Protein also helps keep your muscles strong, helps wounds heal faster, strengthens your immune system, and helps improve overall health. Eat a high protein food (meat, fish, poultry, fresh pork, or eggs) at every meal, or about 8-10 ounces of high protein foods every day.  3 ounces = the size of a deck of  cards, a medium pork chop, a  pound hamburger patty,  chicken breast, a medium fish fillet.  1 ounce = 1 egg or -cup egg substitute, -cup tuna, -cup ricotta cheese, 1 slice of low sodium lunchmeat, 1tablespoon peanut butter,  ounce of nuts or seeds  Note: Even though peanut butter, nuts, seeds, dried beans, peas, and lentils have protein, these foods are generally limited because they are high in both potassium and phosphorus.  Grains/Cereals/Bread Unless you need to limit your calorie intake for weight loss and/or manage carbohydrate intake for blood sugar control, you may eat, as you desire from this food group. Grains, cereals, and breads are a good source of calories. Most people need 6 -11 servings from this group each day.  Amounts equal to one serving: 1 slice bread (white, rye, or sourdough)   English muffin   bagel   hamburger bun   hot dog bun  1 6-inch tortilla   cup cooked pasta  cup cooked white rice   cup cooked cereal (like cream of wheat)  1 cup cold cereal (like corn flakes or crispy rice)  4 unsalted crackers  1 cups unsalted popcorn  10 vanilla wafers  Avoid whole grain and high fiber foods (like whole wheat bread, bran cereal and brown rice) to help you limit your intake of phosphorus. By limiting dairy-based foods you protect your bones and blood vessels. Milk/Yogurt/Cheese Limit your intake of milk, yogurt, and cheese to -cup milk or -cup yogurt or 1-ounce cheese per day.  Most dairy foods are very high in phosphorus.  The phosphorus content is the same for all types of milk - skim, low fat, and whole! If you do eat any high-phosphorus foods, take a phosphate binder with that meal.  Dairy foods low in phosphorus: (ask your dietitian about the serving size that is right for you) Butter and tub margarine  Cream cheese  Heavy cream  Ricotta cheese  Brie cheese  Non-dairy whipped topping  Sherbet If you have or are at risk for heart disease, some  of the high fat foods listed above may not be good choices for you. Certain brands of non-dairy creams and milk (such as rice milk) are low in phosphorus and potassium. Ask your dietitian for details. Fruit/Juice All fruits have some potassium, but certain fruits have more than others and should be limited or totally avoided. Limiting potassium protects your heart. Limit or avoid : Oranges and orange juice  Kiwis  Nectarines  Prunes and prune juice Raisins and dried fruit  Bananas  Melons (cantaloupe and honeydew)  Always AVOID star fruit (carambola). Eat 2-3 servings of low potassium fruits each day. One serving = -cup or 1 small fruit or 4 ounces of juice. Choose: Apple (1)  Berries ( cup)  Cherries (10)  Fruit cocktail, drained ( cup)  Grapes (15)  Peach (1 small fresh or canned, drained) Pear, fresh or canned, drained (1 halve)  Pineapple ( cup canned, drained)  Plums (1-2)  Tangerine (1)  Watermelon (1 small wedge)  Drinks: Apple cider  Cranberry juice cocktail Grape juice  Lemonade  Vegetables/Salads All vegetables have some potassium, but certain vegetables have more than others and should be limited or totally avoided. Limiting potassium intake protects your heart.  Eat 2-3 servings of low-potassium vegetables each day. One serving = -cup.  Choose: Broccoli (raw or cooked from frozen)  Cabbage  Carrots  Cauliflower  Celery  Cucumber  Eggplant  Garlic Green and Wax beans (string beans)  Lettuce-all types (1 cup)  Onion  Peppers-all types and colors  Radishes  Watercress  Zucchini and Yellow squash  Limit or avoid: Potatoes (including [place] Fries, potato chips and sweet potatoes)  Tomatoes and tomato sauce  Winter squash  Pumpkin  Asparagus (cooked) Avocado  Beets  Beet greens  Cooked spinach  Parsnips and rutabaga  Dessert Depending on your calorie needs, your dietitian may recommend high-calorie deserts. Pies, cookies, sherbet, and cakes are  good choices (but limit dairy-based desserts and those made with chocolate, nuts, and bananas). If you have diabetes, discuss low carbohydrate dessert choices with your dietitian. Sample Menu Breakfast Cranberry Juice, 4 ounces Eggs (2) or -cup egg substitute Toasted white or whole wheat bread (2 slices)  Butter or tub margarine or fruit spread Coffee, 6 ounces  Lunch Tuna salad sandwich made with 3 ounces tuna on a hard roll with lettuce and mayonnaise. (Other good choices for sandwiches include egg and chicken salad, lean roast beef, low salt ham and [place] breast.) Coleslaw, -cup Pretzels (low salt) Canned and drained peaches, -cup Ginger Ale, 8 ounces  (Cola drinks are high in phosphorus. Choose ginger ale or lemon-lime beverages instead.)  Dinner Hamburger patty, 4 ounces on a bun with 1-2 teaspoons ketchup Salad (1 cup): lettuce, cucumber, radishes, peppers, with olive oil and vinegar dressing Lemonade, 8 ounces  Aim for at least 2-3 fish meals each week. Many fish are rich in heart-healthy omega-3 fats. Tuna and salmon (rinsed or canned without salt) and shellfish  are excellent heart healthy protein choices.  Snack/Dessert Milk, 4 ounces Slice of apple pie  This meal plan provides 2150 Calories, 91 grams protein, 2300 mg sodium, 1800 mg potassium, 950 mg phosphorus. 38 fluid ounces.  How will I know if I am eating right to stay healthy? Eating well helps you stay healthy. Eating poorly can increase your chances of illness and affect how you feel. Your dietitian will talk with you about how well you are eating and will help you adjust your diet to your individual needs based on your lab report and conversations with you.  Some questions you might be asked:  Have you noticed a change in the kind or amount of food you eat each day?  Have you had any problems eating your usual or recommended diet?  Have you lost weight without trying?  Have you noticed any changes in your  strength or ability to take care of yourself? Your dietitian or nurse might look at the fat and muscle stores in your face, hands, arms, shoulders, and legs. Your dialysis care team will look for changes in your blood level of proteins, and especially one called albumin. A change in this protein can mean that you are losing body protein, but albumin can also be affected if you have an infection or are gaining too much fluid weight between treatments. The dietitian may recommend a protein supplement such as Nepro or LiquaCel to increase protein levels. The dietitian may also suggest small frequent meals and snacks. Work with your dietitian to improve your blood level of protein. The right amount of dialysis is also important for eating well and staying healthy.  What if I have high cholesterol? Changing your diet may help lower the cholesterol level in your blood. Your dietitian will talk with you about the kinds of fat and animal foods you eat. Increasing intake of low potassium fruits and vegetables, decreasing the amount of fried foods, in addition to 150 minutes of physical activity per week can help to improve cholesterol levels.  What if I have diabetes? At first the kidney and diabetic diet appear to be very different, but they are alike in many ways. Both diets recommend eating 3 balanced meals, avoiding large amounts of protein, and limiting sodium. A balanced meal has at least 3 of the food groups (protein, grain, vegetables, fruits, and dairy). The kidney diet limits the amount of milk that you drink, but many people with diabetes already limit milk to 4 ounces a day. Both recommend  plate of vegetables,  plate of carbohydrate rich food,  plate of high protein food, and a piece of fruit. The biggest change is that the kidney diet does not have as much variety in the types of fruits and vegetable choices because some have more potassium than others. The diabetic diet recommends 45 to 75 grams of  carbohydrate with each meal and spacing meals 4 to 5 hours apart. This recommendation is good for the kidney diet, too. Both the kidney and diabetic diet help to keep your heart healthy. In some cases, you may need to make only a few changes in your diet to fit your needs as a kidney patient. For example, you may need to limit some of the free foods you have been using may need to be limited on your kidney diet. Your dietitian will help make a meal plan especially for you. Is there anything else I should know? The following important tips can be helpful with your  diet:  Fresh or plain frozen vegetables contain no added salt. Drain all the cooking liquid before serving.  Canned fruits usually contain less potassium than fresh fruits. Drain all the liquid before serving.  Rice and almond milk are low in phosphorus and can be used in place of milk.  Labels on food packages will give you information about some of the ingredients that may not be allowed in your diet. Learn to read these labels to help you limit sodium and control phosphorous. Avoid foods with ingredients that contain PHOS  To help you avoid salt, many herbs and spices can be used to make your diet more interesting. Check with your dietitian for a list of these. If you would like more information, please contact us. Reviewed by the [employer]: April 2019?  2019 [facility]. All rights reserved. This material does not constitute medical advice. It is intended for informational purposes only. Please consult a physician for specific treatment recommendations.

## 2018-08-22 ENCOUNTER — Inpatient Hospital Stay (HOSPITAL_COMMUNITY): Payer: Medicare HMO

## 2018-08-22 DIAGNOSIS — Z01810 Encounter for preprocedural cardiovascular examination: Secondary | ICD-10-CM

## 2018-08-22 LAB — CBC
HCT: 20 % — ABNORMAL LOW (ref 36.0–46.0)
Hemoglobin: 6.3 g/dL — CL (ref 12.0–15.0)
MCH: 27.4 pg (ref 26.0–34.0)
MCHC: 31.5 g/dL (ref 30.0–36.0)
MCV: 87 fL (ref 80.0–100.0)
Platelets: 294 10*3/uL (ref 150–400)
RBC: 2.3 MIL/uL — ABNORMAL LOW (ref 3.87–5.11)
RDW: 17.2 % — ABNORMAL HIGH (ref 11.5–15.5)
WBC: 7.9 10*3/uL (ref 4.0–10.5)
nRBC: 0 % (ref 0.0–0.2)

## 2018-08-22 LAB — HEPATITIS B SURFACE ANTIGEN: Hepatitis B Surface Ag: NEGATIVE

## 2018-08-22 MED ORDER — SODIUM CHLORIDE 0.9% IV SOLUTION: Freq: Once | INTRAVENOUS | Status: DC

## 2018-08-22 MED ORDER — DARBEPOETIN ALFA 60 MCG/0.3ML IJ SOSY: 60.0000 ug | PREFILLED_SYRINGE | INTRAMUSCULAR | Status: DC

## 2018-08-22 MED ORDER — SODIUM CHLORIDE 0.9 % IV SOLN: 100.0000 mL | INTRAVENOUS | Status: DC | PRN

## 2018-08-22 MED ORDER — HEPARIN SODIUM (PORCINE) 1000 UNIT/ML IJ SOLN
INTRAMUSCULAR | Status: AC
  Administered 2018-08-22: 3200 [IU] via INTRAVENOUS_CENTRAL
  Filled 2018-08-22: qty 4

## 2018-08-22 MED ORDER — FLUCONAZOLE 150 MG PO TABS
150.0000 mg | ORAL_TABLET | Freq: Once | ORAL | Status: AC
  Administered 2018-08-22: 150 mg via ORAL
  Filled 2018-08-22 (×2): qty 1

## 2018-08-22 MED ORDER — HEPARIN SODIUM (PORCINE) 1000 UNIT/ML DIALYSIS
1000.0000 [IU] | INTRAMUSCULAR | Status: DC | PRN
  Administered 2018-08-23: 1000 [IU] via INTRAVENOUS_CENTRAL
  Filled 2018-08-22 (×2): qty 1

## 2018-08-22 MED ORDER — TRAMADOL HCL 50 MG PO TABS
50.0000 mg | ORAL_TABLET | Freq: Once | ORAL | Status: AC
  Administered 2018-08-22: 50 mg via ORAL
  Filled 2018-08-22: qty 1

## 2018-08-22 MED ORDER — ALTEPLASE 2 MG IJ SOLR: 2.0000 mg | Freq: Once | INTRAMUSCULAR | Status: DC | PRN

## 2018-08-22 NOTE — Plan of Care (Signed)
  Problem: Education: Goal: Knowledge of General Education information will improve Description: Including pain rating scale, medication(s)/side effects and non-pharmacologic comfort measures Outcome: Progressing   Problem: Health Behavior/Discharge Planning: Goal: Ability to manage health-related needs will improve Outcome: Progressing   Problem: Activity: Goal: Risk for activity intolerance will decrease Outcome: Progressing   

## 2018-08-22 NOTE — Progress Notes (Signed)
Pt back from HD, requesting pain medicine and something to help her sleep. NP paged.

## 2018-08-22 NOTE — Progress Notes (Signed)
Pt hgb 6.3, NP paged.

## 2018-08-22 NOTE — Progress Notes (Signed)
VASCULAR LAB PRELIMINARY  PRELIMINARY  PRELIMINARY  PRELIMINARY  Vein mapping completed.    Preliminary report:  See CV proc for preliminary results.   Ketsia Linebaugh, RVT 08/22/2018, 1:59 PM

## 2018-08-22 NOTE — Progress Notes (Signed)
Patient refusing blood transfusion, says she is Neurosurgeon witness. NP notified, will get consent signed.

## 2018-08-22 NOTE — Progress Notes (Signed)
Discussed with patient yesterday plan for left arm AVF vs graft on Tuesday given next OR availability.  Needs bilateral upper extremity vein mapping.  This has been ordered.  Will follow-up again tomorrow once complete.   Marty Heck, MD [facility] [place] Office: [phone] Pager: [place]

## 2018-08-22 NOTE — Progress Notes (Signed)
[facility]    NEPHROLOGY PROGRESS NOTE  SUBJECTIVE: Patient underwent first dialysis treatment yesterday.  Patient seen and examined.  Denies any fevers, chills, chest pain, shortness of breath, dysuria, hematuria, or myalgias.  Denies any nausea.  Was able to eat breakfast without difficulty.  All other review of systems are negative.   OBJECTIVE:  Vitals:   08/22/18 0322 08/22/18 0823  BP: (!) 156/83 (!) 181/84  Pulse: 89 66  Resp: 16 20  Temp: 98.6 F (37 C) 98.6 F (37 C)  SpO2: 99% 100%    Intake/Output Summary (Last 24 hours) at 08/22/2018 1048 Last data filed at 08/22/2018 0900 Gross per 24 hour  Intake 480 ml  Output 250 ml  Net 230 ml      General:  AAOx3 NAD HEENT: MMM [place] AT anicteric sclera Neck:  No JVD, no adenopathy CV:  Heart RRR  Lungs:  L/S CTA bilaterally Abd:  abd SNT/ND with normal BS GU:  Bladder non-palpable Extremities:  No LE edema. Skin:  No skin rash  MEDICATIONS:  . sodium chloride   Intravenous Once  . atorvastatin  10 mg Oral QHS  . Chlorhexidine Gluconate Cloth  6 each Topical Q0600  . darbepoetin (ARANESP) injection - NON-DIALYSIS  40 mcg Subcutaneous Once  . DULoxetine  60 mg Oral QHS  . feeding supplement (PRO-STAT SUGAR FREE 64)  30 mL Oral BID  . labetalol  200 mg Oral BID  . multivitamin  1 tablet Oral QHS  . NIFEdipine  30 mg Oral QHS  . sevelamer carbonate  800 mg Oral TID WC  . sodium bicarbonate  650 mg Oral BID     LABS:   CBC Latest Ref Rng & Units 08/22/2018 08/20/2018  WBC 4.0 - 10.5 K/uL 7.9 7.0  Hemoglobin 12.0 - 15.0 g/dL 6.3(LL) 7.0(L)  Hematocrit 36.0 - 46.0 % 20.0(L) 22.8(L)  Platelets 150 - 400 K/uL 294 350    CMP Latest Ref Rng & Units 08/21/2018 08/20/2018  Glucose 70 - 99 mg/dL 129(H) 165(H)  BUN 8 - 23 mg/dL 136(H) 138(H)  Creatinine 0.44 - 1.00 mg/dL 17.79(H) 18.80(H)  Sodium 135 - 145 mmol/L 136 134(L)  Potassium 3.5 - 5.1 mmol/L 4.3 4.8  Chloride 98 - 111 mmol/L 105 98  CO2 22 -  32 mmol/L 17(L) 18(L)  Calcium 8.9 - 10.3 mg/dL 8.3(L) 9.2  Total Protein 6.5 - 8.1 g/dL - 6.3(L)  Total Bilirubin 0.3 - 1.2 mg/dL - 0.8  Alkaline Phos 38 - 126 U/L - 47  AST 15 - 41 U/L - 9(L)  ALT 0 - 44 U/L - 10    Lab Results  Component Value Date   CALCIUM 8.3 (L) 08/21/2018   PHOS 6.2 (H) 08/21/2018       Component Value Date/Time   COLORURINE YELLOW 08/20/2018 2040   APPEARANCEUR HAZY (A) 08/20/2018 2040   LABSPEC 1.012 08/20/2018 2040   PHURINE 5.0 08/20/2018 2040   GLUCOSEU NEGATIVE 08/20/2018 2040   HGBUR SMALL (A) 08/20/2018 2040   BILIRUBINUR NEGATIVE 08/20/2018 2040   KETONESUR NEGATIVE 08/20/2018 2040   PROTEINUR 100 (A) 08/20/2018 2040   NITRITE NEGATIVE 08/20/2018 2040   LEUKOCYTESUR LARGE (A) 08/20/2018 2040   No results found for: PHART, PCO2ART, PO2ART, HCO3, TCO2, ACIDBASEDEF, O2SAT     Component Value Date/Time   IRON 39 08/21/2018 0113   TIBC 204 (L) 08/21/2018 0113   FERRITIN 349 (H) 08/21/2018 0113   IRONPCTSAT 19 08/21/2018 0113  ASSESSMENT/PLAN:      The patient is a 73 y.o. year old female patient with a past medical history significant for stage V chronic kidney disease who presented from her appointment with Dr. Justin Mend after 2 episodes of syncope in the lobby.  She reports feeling weak for quite some time.  In the emergency department, she was noted to have a creatinine of 18.8.   1.    End-stage renal disease.  We will plan for her second dialysis treatment on Monday.    Awaiting vein mapping for permanent dialysis access.  2.  Anemia.  Hemoccult was negative.  Likely secondary to chronic kidney disease.    Will add Aranesp and IV iron for low iron sat.  Is pending a blood transfusion for low hemoglobin this morning.  3.  Syncope.  Suspect hemodynamic in nature.  Stable hemodynamics today.  4.  Hypertension.  Blood pressure currently is stable.  Will continue current therapy.  5.  Metabolic acidosis.  Continue sodium bicarbonate.   Will likely be able to manage with dialysis.  6.  Metabolic bone disease.  Check intact PTH which is pending.  Phosphorus elevated at 6.2.  We will add phosphate binder.    [place], DO, [place]

## 2018-08-22 NOTE — Progress Notes (Signed)
PROGRESS NOTE    Bianca Myers  WER:[id number] DOB: 1945-11-29 DOA: 08/20/2018 PCP: [name], MD    Brief Narrative::73 y.o.female,with past medical history significant for chronic kidney disease stage V who is presenting to the emergency department for a syncopal episode earlier today. That the patient was on her way to her nephrologist office when this happened. After further questioning the patient reports similar episodes for the last 2 days and she had another episode in the lobby today. Patient reports 1 year history of feeling weak and has history for chronic kidney disease stage V. The patient was advised to start dialysis by her primary nephrologist but refused. Patient denies any preceding chest pains palpitations, shortness of breath, nausea vomiting or diarrhea. In the emergency room patient's troponin was slightly elevated at 0.04 with a creatinine of 18.8, potassium 4.8. Her hemoglobin was 7. Her fecal occult blood was negative .CT of the head was negative for acute events but small lacunar infarcts in the right basal ganglia were noted with age-related cerebral atrophy.  08/22/2018 patient does not want any blood products as she is Jehovah witness.  And she is very adamant about it.  She had her first dialysis treatment yesterday.  She denies any new complaints today she denies any nausea vomiting. Assessment & Plan:   Active Problems:   Syncope   CKD (chronic kidney disease) requiring chronic dialysis (HCC)   Creatinine elevation   Anemia    1  chronic kidney disease stage V with metabolic acidosis creatinine bumped up to 17 planning for dialysis.  Permacath to be placed today.  Patient sees Dr. Justin Mend as an outpatient.  She is agreeable to dialysis if needed.  Continue sodium bicarbonate.  For vein mapping and aVF versus graft to be placed on Tuesday.  Appreciate all consultants.  #2 syncope possibly secondary to intravascular depletion  #3 elevated  troponin no acute EKG changes most likely secondary to CKD  #4 hypertension continue labetalol and Procardia  #5 anxiety and depression continue Cymbalta  #6 hyperlipidemia continue statin  #7 anemia of chronic disease most likely secondary to CKD with low iron 39.  FOBT negative.  Hemoglobin 6.3 this morning patient absolutely refuses any blood products.    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   Weight as of this encounter: 81 kg.    Subjective: Sitting in bed feels better no complaints of nausea vomiting refusing blood transfusion  Objective: Vitals:   08/22/18 0322 08/22/18 0823 08/22/18 1058 08/22/18 1150  BP: (!) 156/83 (!) 181/84 (!) 153/91 130/88  Pulse: 89 66 66 64  Resp: 16 20  20   Temp: 98.6 F (37 C) 98.6 F (37 C)  98.6 F (37 C)  TempSrc: Oral Oral  Oral  SpO2: 99% 100%  100%  Weight:      Height:        Intake/Output Summary (Last 24 hours) at 08/22/2018 1212 Last data filed at 08/22/2018 0900 Gross per 24 hour  Intake 480 ml  Output 250 ml  Net 230 ml   Filed Weights   08/21/18 2156 08/22/18 0005 08/22/18 0304  Weight: 82.6  kg 82.7 kg 81 kg    Examination:  General exam: Appears calm and comfortable  Respiratory system: Diminished breath sounds at the bases to auscultation. Respiratory effort normal. Cardiovascular system: S1 & S2 heard, RRR. No JVD, murmurs, rubs, gallops or clicks. No pedal edema. Gastrointestinal system: Abdomen is nondistended, soft and nontender. No organomegaly or masses felt. Normal bowel sounds heard. Central nervous system: Alert and  oriented. No focal neurological deficits. Extremities: Trace edema bilaterally Skin: No rashes, lesions or ulcers Psychiatry: Judgement and insight appear normal. Mood & affect appropriate.     Data Reviewed: I have personally reviewed following labs and imaging studies  CBC: Recent Labs  Lab 08/20/18 1324 08/22/18 0104  WBC 7.0 7.9  NEUTROABS 5.5  --   HGB 7.0* 6.3*  HCT 22.8* 20.0*  MCV 88.0 87.0  PLT 350 720   Basic Metabolic Panel: Recent Labs  Lab 08/20/18 1324 08/21/18 0113  NA 134* 136  K 4.8 4.3  CL 98 105  CO2 18* 17*  GLUCOSE 165* 129*  BUN 138* 136*  CREATININE 18.80* 17.79*  CALCIUM 9.2 8.3*  PHOS  --  6.2*   GFR: Estimated Creatinine Clearance: 3.1 mL/min (A) (by C-G formula based on SCr of 17.79 mg/dL (H)). Liver Function Tests: Recent Labs  Lab 08/20/18 1324  AST 9*  ALT 10  ALKPHOS 47  BILITOT 0.8  PROT 6.3*  ALBUMIN 3.3*   No results for input(s): LIPASE, AMYLASE in the last 168 hours. No results for input(s): AMMONIA in the last 168 hours. Coagulation Profile: Recent Labs  Lab 08/21/18 1145  INR 1.1   Cardiac Enzymes: Recent Labs  Lab 08/20/18 1324 08/20/18 1910 08/21/18 0113 08/21/18 0827  TROPONINI 0.04* 0.03* 0.04* 0.04*   BNP (last 3 results) No results for input(s): PROBNP in the last 8760 hours. HbA1C: No results for input(s): HGBA1C in the last 72 hours. CBG: No results for input(s): GLUCAP in the last 168 hours. Lipid Profile: No results for input(s): CHOL, HDL, LDLCALC, TRIG, CHOLHDL, LDLDIRECT in the last 72 hours. Thyroid Function Tests: No results for input(s): TSH, T4TOTAL, FREET4, T3FREE, THYROIDAB in the last 72 hours. Anemia Panel: Recent Labs    08/21/18 0113  FERRITIN 349*  TIBC 204*  IRON 39   Sepsis Labs: No results for input(s): PROCALCITON, LATICACIDVEN in the last 168 hours.  Recent Results (from the past 240 hour(s))  SARS Coronavirus 2     Status: None   Collection Time: 08/20/18  3:11  PM  Result Value Ref Range Status   SARS Coronavirus 2 NOT DETECTED NOT DETECTED Final    Comment: (NOTE) SARS-CoV-2 target nucleic acids are NOT DETECTED. The SARS-CoV-2 RNA is generally detectable in upper and lower respiratory specimens during the acute phase of infection.  Negative  results do not preclude SARS-CoV-2 infection, do not rule out co-infections with other pathogens, and should not be used as the sole basis for treatment or other patient management decisions.  Negative results must be combined with clinical observations, patient history, and epidemiological information. The expected result is Not Detected. Fact Sheet for Patients: http://www.biofiredefense.com/wp-content/uploads/2020/03/BIOFIRE-COVID -19-patients.pdf Fact Sheet for Healthcare Providers: http://www.biofiredefense.com/wp-content/uploads/2020/03/BIOFIRE-COVID -19-hcp.pdf This test is not yet approved or cleared by the [place] and  has been authorized for detection and/or diagnosis of SARS-CoV-2 by FDA under an Emergency Use Authorization (EUA).  This EUA will remain in effec t (meaning this test can be used) for the duration of  the COVID-19 declaration under Section 564(b)(1)  of the Act, 21 U.S.C. section 360bbb-3(b)(1), unless the authorization is terminated or revoked sooner. Performed at [facility], [place] [address], [place], [place] [address]          Radiology Studies: Dg Chest 2 View  Result Date: 08/20/2018 CLINICAL DATA:  Syncope EXAM: CHEST - 2 VIEW COMPARISON:  10/31/2013 FINDINGS: Scarring and fibrosis within the right greater than left upper lobe as before. No acute consolidation or effusion. Stable cardiomediastinal silhouette with unfolded aorta. No pneumothorax. Degenerative changes of the spine. IMPRESSION: No active cardiopulmonary disease. Stable right greater than left upper lobe scarring and fibrosis. Electronically Signed   By: Donavan Foil M.D.   On:  08/20/2018 14:49   Ct Head Wo Contrast  Result Date: 08/20/2018 CLINICAL DATA:  Syncopal episode. EXAM: CT HEAD WITHOUT CONTRAST TECHNIQUE: Contiguous axial images were obtained from the base of the skull through the vertex without intravenous contrast. COMPARISON:  None. FINDINGS: Brain: The ventricles are in the midline without mass effect or shift. No extra-axial fluid collections are identified. Age related cerebral atrophy, ventriculomegaly and periventricular white matter disease. Remote appearing lacunar type right basal ganglia infarcts are noted. No CT findings for acute hemispheric infarction or intracranial hemorrhage. No mass lesions. The brainstem and cerebellum are unremarkable. 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 Vascular: Vascular calcifications but no definite aneurysm or hyperdense vessels. Skull: No skull fracture or bone lesions. Sinuses/Orbits: The paranasal sinuses and mastoid air cells are clear. The globes are intact. Other: No scalp lesions or hematoma. IMPRESSION: 1. No CT findings for an acute intracranial process. 2. 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 3. Small midline lipomas. 4. Age related cerebral atrophy, ventriculomegaly and periventricular white matter disease. Electronically Signed   By: Marijo Sanes M.D.   On: 08/20/2018 14:22   Ir Fluoro Guide Cv Line Right  Result Date: 08/21/2018 INDICATION: 73 year old with end-stage renal disease and starting hemodialysis. EXAM: FLUOROSCOPIC AND ULTRASOUND GUIDED PLACEMENT OF A TUNNELED DIALYSIS CATHETER Physician: Stephan Minister. Anselm Pancoast, MD MEDICATIONS: Ancef 2 g; The antibiotic was administered within an appropriate time interval prior to skin puncture. ANESTHESIA/SEDATION: Versed 2.0 mg IV; Fentanyl 100 mcg IV; Moderate Sedation Time:  20 minutes The patient was continuously monitored during the procedure by the interventional radiology nurse under my direct supervision. FLUOROSCOPY TIME:  Fluoroscopy  Time: 36 seconds, 2 mGy COMPLICATIONS: None immediate. PROCEDURE: The procedure was explained to the patient. The risks and benefits of the procedure were discussed and the patient's questions were addressed. Informed consent was obtained from the patient. The patient was placed supine on the interventional table. Ultrasound confirmed a patent right internal jugular vein. Ultrasound images were obtained for documentation. The right neck and chest was prepped and draped in a sterile fashion. The right neck was anesthetized with 1% lidocaine. Maximal barrier sterile technique was utilized including caps, mask, sterile gowns, sterile gloves, sterile drape, hand hygiene and skin antiseptic. A small incision was made with #11 blade scalpel. A 21 gauge needle directed into the right internal jugular vein with ultrasound guidance. A micropuncture dilator set was placed. A 19 cm tip to cuff Palindrome catheter was selected. The skin below the right clavicle was anesthetized and a small incision was made with an #11 blade scalpel. A subcutaneous tunnel was formed to the vein dermatotomy site. The catheter was brought through the tunnel. The vein dermatotomy site was dilated to accommodate a peel-away sheath. The catheter was placed through the peel-away  sheath and directed into the central venous structures. The tip of the catheter was placed at the SVC and right atrium junction with fluoroscopy. Fluoroscopic images were obtained for documentation. Both lumens were found to aspirate and flush well. The proper amount of heparin was flushed in both lumens. The vein dermatotomy site was closed using a single layer of absorbable suture and Dermabond. Gel-Foam was placed in the subcutaneous tract. The catheter was secured to the skin using Prolene suture. IMPRESSION: Successful placement of a right jugular tunneled dialysis catheter using ultrasound and fluoroscopic guidance. Electronically Signed   By: Markus Daft M.D.   On:  08/21/2018 16:14   Ir US Guide Vasc Access Right  Result Date: 08/21/2018 INDICATION: 73 year old with end-stage renal disease and starting hemodialysis. EXAM: FLUOROSCOPIC AND ULTRASOUND GUIDED PLACEMENT OF A TUNNELED DIALYSIS CATHETER Physician: Stephan Minister. Anselm Pancoast, MD MEDICATIONS: Ancef 2 g; The antibiotic was administered within an appropriate time interval prior to skin puncture. ANESTHESIA/SEDATION: Versed 2.0 mg IV; Fentanyl 100 mcg IV; Moderate Sedation Time:  20 minutes The patient was continuously monitored during the procedure by the interventional radiology nurse under my direct supervision. FLUOROSCOPY TIME:  Fluoroscopy Time: 36 seconds, 2 mGy COMPLICATIONS: None immediate. PROCEDURE: The procedure was explained to the patient. The risks and benefits of the procedure were discussed and the patient's questions were addressed. Informed consent was obtained from the patient. The patient was placed supine on the interventional table. Ultrasound confirmed a patent right internal jugular vein. Ultrasound images were obtained for documentation. The right neck and chest was prepped and draped in a sterile fashion. The right neck was anesthetized with 1% lidocaine. Maximal barrier sterile technique was utilized including caps, mask, sterile gowns, sterile gloves, sterile drape, hand hygiene and skin antiseptic. A small incision was made with #11 blade scalpel. A 21 gauge needle directed into the right internal jugular vein with ultrasound guidance. A micropuncture dilator set was placed. A 19 cm tip to cuff Palindrome catheter was selected. The skin below the right clavicle was anesthetized and a small incision was made with an #11 blade scalpel. A subcutaneous tunnel was formed to the vein dermatotomy site. The catheter was brought through the tunnel. The vein dermatotomy site was dilated to accommodate a peel-away sheath. The catheter was placed through the peel-away sheath and directed into the central venous  structures. The tip of the catheter was placed at the SVC and right atrium junction with fluoroscopy. Fluoroscopic images were obtained for documentation. Both lumens were found to aspirate and flush well. The proper amount of heparin was flushed in both lumens. The vein dermatotomy site was closed using a single layer of absorbable suture and Dermabond. Gel-Foam was placed in the subcutaneous tract. The catheter was secured to the skin using Prolene suture. IMPRESSION: Successful placement of a right jugular tunneled dialysis catheter using ultrasound and fluoroscopic guidance. Electronically Signed   By: Markus Daft M.D.   On: 08/21/2018 16:14        Scheduled Meds:  sodium chloride   Intravenous Once   atorvastatin  10 mg Oral QHS   Chlorhexidine Gluconate Cloth  6 each Topical Q0600   darbepoetin (ARANESP) injection - NON-DIALYSIS  40 mcg Subcutaneous Once   DULoxetine  60 mg Oral QHS   feeding supplement (PRO-STAT SUGAR FREE 64)  30 mL Oral BID   fluconazole  150 mg Oral Once   labetalol  200 mg Oral BID   multivitamin  1 tablet Oral QHS  NIFEdipine  30 mg Oral QHS   sevelamer carbonate  800 mg Oral TID WC   sodium bicarbonate  650 mg Oral BID   Continuous Infusions:  sodium chloride     sodium chloride       LOS: 2 days     Georgette Shell, MD [facility] If 7PM-7AM, please contact night-coverage [email or web address] Password [email or web address] 08/22/2018, 12:12 PM

## 2018-08-22 NOTE — Progress Notes (Signed)
Per phlebotomist, patient has refused type and screen twice today.    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

## 2018-08-23 LAB — BASIC METABOLIC PANEL
Anion gap: 10 (ref 5–15)
BUN: 81 mg/dL — ABNORMAL HIGH (ref 8–23)
CO2: 22 mmol/L (ref 22–32)
Calcium: 7.9 mg/dL — ABNORMAL LOW (ref 8.9–10.3)
Chloride: 104 mmol/L (ref 98–111)
Creatinine, Ser: 12.15 mg/dL — ABNORMAL HIGH (ref 0.44–1.00)
GFR calc Af Amer: 3 mL/min — ABNORMAL LOW (ref >60)
GFR calc non Af Amer: 3 mL/min — ABNORMAL LOW (ref >60)
Glucose, Bld: 90 mg/dL (ref 70–99)
Potassium: 3.9 mmol/L (ref 3.5–5.1)
Sodium: 136 mmol/L (ref 135–145)

## 2018-08-23 LAB — PARATHYROID HORMONE, INTACT (NO CA): PTH: 16 pg/mL (ref 15–65)

## 2018-08-23 LAB — CBC
HCT: 19.5 % — ABNORMAL LOW (ref 36.0–46.0)
Hemoglobin: 6 g/dL — CL (ref 12.0–15.0)
MCH: 27.1 pg (ref 26.0–34.0)
MCHC: 30.8 g/dL (ref 30.0–36.0)
MCV: 88.2 fL (ref 80.0–100.0)
Platelets: 243 10*3/uL (ref 150–400)
RBC: 2.21 MIL/uL — ABNORMAL LOW (ref 3.87–5.11)
RDW: 17.2 % — ABNORMAL HIGH (ref 11.5–15.5)
WBC: 7.2 10*3/uL (ref 4.0–10.5)
nRBC: 0 % (ref 0.0–0.2)

## 2018-08-23 MED ORDER — CEFAZOLIN SODIUM-DEXTROSE 1-4 GM/50ML-% IV SOLN: 1.0000 g | INTRAVENOUS | Status: DC

## 2018-08-23 MED ORDER — HEPARIN SODIUM (PORCINE) 1000 UNIT/ML IJ SOLN
INTRAMUSCULAR | Status: AC
  Filled 2018-08-23: qty 4

## 2018-08-23 MED ORDER — SODIUM CHLORIDE 0.9 % IV SOLN: 100.0000 mL | INTRAVENOUS | Status: DC | PRN

## 2018-08-23 MED ORDER — CEFAZOLIN SODIUM-DEXTROSE 2-4 GM/100ML-% IV SOLN
2.0000 g | INTRAVENOUS | Status: AC
  Administered 2018-08-24: 2 g via INTRAVENOUS
  Filled 2018-08-23: qty 100

## 2018-08-23 NOTE — Plan of Care (Signed)
  Problem: Education: Goal: Knowledge of General Education information will improve Description: Including pain rating scale, medication(s)/side effects and non-pharmacologic comfort measures Outcome: Progressing   Problem: Health Behavior/Discharge Planning: Goal: Ability to manage health-related needs will improve Outcome: Progressing   Problem: Pain Managment: Goal: General experience of comfort will improve Outcome: Progressing   

## 2018-08-23 NOTE — Progress Notes (Signed)
Renal Navigator received notification from Nephrologist/Dr. Grayland Ormond to initiate OP HD referral. Renal Navigator spoke with patient to complete assessment and submitted referral to [facility] for OP HD clinic/[place].  Renal Navigator will follow up with Nephrologist and patient once seat schedule is obtained.  Alphonzo Cruise, [place] Renal Navigator [phone]

## 2018-08-23 NOTE — Progress Notes (Signed)
PROGRESS NOTE    Bianca Myers  OZH:[id number] DOB: 07/26/1945 DOA: 08/20/2018 PCP: [name], MD  Brief Narrative:73 y.o.female,with past medical history significant for chronic kidney disease stage V who is presenting to the emergency department for a syncopal episode earlier today. That the patient was on her way to her nephrologist office when this happened. After further questioning the patient reports similar episodes for the last 2 days and she had another episode in the lobby today. Patient reports 1 year history of feeling weak and has history for chronic kidney disease stage V. The patient was advised to start dialysis by her primary nephrologist but refused. Patient denies any preceding chest pains palpitations, shortness of breath, nausea vomiting or diarrhea. In the emergency room patient's troponin was slightly elevated at 0.04 with a creatinine of 18.8, potassium 4.8. Her hemoglobin was 7. Her fecal occult blood was negative .CT of the head was negative for acute events but small lacunar infarcts in the right basal ganglia were noted with age-related cerebral atrophy.  08/22/2018 patient does not want any blood products as she is Jehovah witness.  And she is very adamant about it.  She had her first dialysis treatment yesterday.  She denies any new complaints today she denies any nausea vomiting. Assessment & Plan:  08/23/2018 patient seen at dialysis has no new complaints she feels better    73 with CKD stage V with a creatinine of 17 went on to have dialysis for the first time this admission.  She is tolerating dialysis well.  However she is a Jehovah witness so she does not want to take any blood or blood products.  She has received Feraheme.  Active Problems:   Syncope   CKD (chronic kidney disease) requiring chronic dialysis (HCC)   Creatinine elevation   Anemia  1 chronic kidney disease stage V with metabolic  acidosis-patient is receiving her second dialysis today.  Her creatinine came down from 17-12.15  . Continue sodium bicarbonate.  For vein mapping and aVF versus graft to be placed on Tuesday.  Appreciate all consultants.  #2 syncope possibly secondary to intravascular depletion  #3 elevated troponin no acute EKG changes most likely secondary to CKD  #4 hypertension continue labetalol and Procardia  #5 anxiety and depression continue Cymbalta  #6 hyperlipidemia continue statin  #7 anemia of chronic disease most likely secondary to CKD with low iron 39. FOBT negative.Hemoglobin 6.0 this morning patient absolutely refuses any blood products.    DVT prophylaxis SCD CODE STATUS full Disposition pending clinical improvement Family communication called her son Drenda Freeze with no answer         Nutrition Problem: Increased nutrient needs Etiology: chronic illness(ESRD on HD)     Signs/Symptoms: estimated needs    Interventions: Prostat, MVI  Estimated body mass index is 28.46 kg/m as calculated from the following:   Height as of this encounter: 5' 6.5" (1.689 m).   Weight as of this encounter: 81.2 kg.  Subjective: Resting in bed seen at dialysis feels well making some urine no new complaints no nausea  Objective: Vitals:   08/23/18 0915 08/23/18 0945 08/23/18 1015 08/23/18 1045  BP: 130/70 138/73 127/76 137/72  Pulse: 71 66 82 69  Resp: 18 18 20  (!) 21  Temp:      TempSrc:      SpO2:      Weight:      Height:        Intake/Output Summary (Last  24 hours) at 08/23/2018 1144 Last data filed at 08/23/2018 0600 Gross per 24 hour  Intake 720 ml  Output 550 ml  Net 170 ml   Filed Weights   08/22/18 0304 08/23/18 0517 08/23/18 0855  Weight: 81 kg 81.1 kg 81.2 kg    Examination:  General exam: Appears calm and comfortable  Respiratory system: Clear to auscultation. Respiratory effort normal. Cardiovascular system: S1 & S2 heard, RRR. No JVD,  murmurs, rubs, gallops or clicks. No pedal edema. Gastrointestinal system: Abdomen is nondistended, soft and nontender. No organomegaly or masses felt. Normal bowel sounds heard. Central nervous system: Alert and oriented. No focal neurological deficits. Extremities: Trace edema bilaterally  skin: No rashes, lesions or ulcers Psychiatry: Judgement and insight appear normal. Mood & affect appropriate.     Data Reviewed: I have personally reviewed following labs and imaging studies  CBC: Recent Labs  Lab 08/20/18 1324 08/22/18 0104 08/23/18 0528  WBC 7.0 7.9 7.2  NEUTROABS 5.5  --   --   HGB 7.0* 6.3* 6.0*  HCT 22.8* 20.0* 19.5*  MCV 88.0 87.0 88.2  PLT 350 294 540   Basic Metabolic Panel: Recent Labs  Lab 08/20/18 1324 08/21/18 0113 08/23/18 0528  NA 134* 136 136  K 4.8 4.3 3.9  CL 98 105 104  CO2 18* 17* 22  GLUCOSE 165* 129* 90  BUN 138* 136* 81*  CREATININE 18.80* 17.79* 12.15*  CALCIUM 9.2 8.3* 7.9*  PHOS  --  6.2*  --    GFR: Estimated Creatinine Clearance: 4.5 mL/min (A) (by C-G formula based on SCr of 12.15 mg/dL (H)). Liver Function Tests: Recent Labs  Lab 08/20/18 1324  AST 9*  ALT 10  ALKPHOS 47  BILITOT 0.8  PROT 6.3*  ALBUMIN 3.3*   No results for input(s): LIPASE, AMYLASE in the last 168 hours. No results for input(s): AMMONIA in the last 168 hours. Coagulation Profile: Recent Labs  Lab 08/21/18 1145  INR 1.1   Cardiac Enzymes: Recent Labs  Lab 08/20/18 1324 08/20/18 1910 08/21/18 0113 08/21/18 0827  TROPONINI 0.04* 0.03* 0.04* 0.04*   BNP (last 3 results) No results for input(s): PROBNP in the last 8760 hours. HbA1C: No results for input(s): HGBA1C in the last 72 hours. CBG: No results for input(s): GLUCAP in the last 168 hours. Lipid Profile: No results for input(s): CHOL, HDL, LDLCALC, TRIG, CHOLHDL, LDLDIRECT in the last 72 hours. Thyroid Function Tests: No results for input(s): TSH, T4TOTAL, FREET4, T3FREE, THYROIDAB in  the last 72 hours. Anemia Panel: Recent Labs    08/21/18 0113  FERRITIN 349*  TIBC 204*  IRON 39   Sepsis Labs: No results for input(s): PROCALCITON, LATICACIDVEN in the last 168 hours.  Recent Results (from the past 240 hour(s))  SARS Coronavirus 2     Status: None   Collection Time: 08/20/18  3:11 PM  Result Value Ref Range Status   SARS Coronavirus 2 NOT DETECTED NOT DETECTED Final    Comment: (NOTE) SARS-CoV-2 target nucleic acids are NOT DETECTED. The SARS-CoV-2 RNA is generally detectable in upper and lower respiratory specimens during the acute phase of infection.  Negative  results do not preclude SARS-CoV-2 infection, do not rule out co-infections with other pathogens, and should not be used as the sole basis for treatment or other patient management decisions.  Negative results must be combined with clinical observations, patient history, and epidemiological information. The expected result is Not Detected. Fact Sheet for Patients: http://www.biofiredefense.com/wp-content/uploads/2020/03/BIOFIRE-COVID -19-patients.pdf Fact Sheet for  Healthcare Providers: http://www.biofiredefense.com/wp-content/uploads/2020/03/BIOFIRE-COVID -19-hcp.pdf This test is not yet approved or cleared by the [place] and  has been authorized for detection and/or diagnosis of SARS-CoV-2 by FDA under an Emergency Use Authorization (EUA).  This EUA will remain in effec t (meaning this test can be used) for the duration of  the COVID-19 declaration under Section 564(b)(1) of the Act, 21 U.S.C. section 360bbb-3(b)(1), unless the authorization is terminated or revoked sooner. Performed at [facility], [place] [address], [place], [place] [address]          Radiology Studies: Ir Cyndy Freeze Guide Cv Line Right  Result Date: 08/21/2018 INDICATION: 73 year old with end-stage renal disease and starting hemodialysis. EXAM: FLUOROSCOPIC AND ULTRASOUND GUIDED PLACEMENT OF A TUNNELED  DIALYSIS CATHETER Physician: Stephan Minister. Anselm Pancoast, MD MEDICATIONS: Ancef 2 g; The antibiotic was administered within an appropriate time interval prior to skin puncture. ANESTHESIA/SEDATION: Versed 2.0 mg IV; Fentanyl 100 mcg IV; Moderate Sedation Time:  20 minutes The patient was continuously monitored during the procedure by the interventional radiology nurse under my direct supervision. FLUOROSCOPY TIME:  Fluoroscopy Time: 36 seconds, 2 mGy COMPLICATIONS: None immediate. PROCEDURE: The procedure was explained to the patient. The risks and benefits of the procedure were discussed and the patient's questions were addressed. Informed consent was obtained from the patient. The patient was placed supine on the interventional table. Ultrasound confirmed a patent right internal jugular vein. Ultrasound images were obtained for documentation. The right neck and chest was prepped and draped in a sterile fashion. The right neck was anesthetized with 1% lidocaine. Maximal barrier sterile technique was utilized including caps, mask, sterile gowns, sterile gloves, sterile drape, hand hygiene and skin antiseptic. A small incision was made with #11 blade scalpel. A 21 gauge needle directed into the right internal jugular vein with ultrasound guidance. A micropuncture dilator set was placed. A 19 cm tip to cuff Palindrome catheter was selected. The skin below the right clavicle was anesthetized and a small incision was made with an #11 blade scalpel. A subcutaneous tunnel was formed to the vein dermatotomy site. The catheter was brought through the tunnel. The vein dermatotomy site was dilated to accommodate a peel-away sheath. The catheter was placed through the peel-away sheath and directed into the central venous structures. The tip of the catheter was placed at the SVC and right atrium junction with fluoroscopy. Fluoroscopic images were obtained for documentation. Both lumens were found to aspirate and flush well. The proper amount  of heparin was flushed in both lumens. The vein dermatotomy site was closed using a single layer of absorbable suture and Dermabond. Gel-Foam was placed in the subcutaneous tract. The catheter was secured to the skin using Prolene suture. IMPRESSION: Successful placement of a right jugular tunneled dialysis catheter using ultrasound and fluoroscopic guidance. Electronically Signed   By: Markus Daft M.D.   On: 08/21/2018 16:14   Ir US Guide Vasc Access Right  Result Date: 08/21/2018 INDICATION: 73 year old with end-stage renal disease and starting hemodialysis. EXAM: FLUOROSCOPIC AND ULTRASOUND GUIDED PLACEMENT OF A TUNNELED DIALYSIS CATHETER Physician: Stephan Minister. Anselm Pancoast, MD MEDICATIONS: Ancef 2 g; The antibiotic was administered within an appropriate time interval prior to skin puncture. ANESTHESIA/SEDATION: Versed 2.0 mg IV; Fentanyl 100 mcg IV; Moderate Sedation Time:  20 minutes The patient was continuously monitored during the procedure by the interventional radiology nurse under my direct supervision. FLUOROSCOPY TIME:  Fluoroscopy Time: 36 seconds, 2 mGy COMPLICATIONS: None immediate. PROCEDURE: The procedure was explained to the  patient. The risks and benefits of the procedure were discussed and the patient's questions were addressed. Informed consent was obtained from the patient. The patient was placed supine on the interventional table. Ultrasound confirmed a patent right internal jugular vein. Ultrasound images were obtained for documentation. The right neck and chest was prepped and draped in a sterile fashion. The right neck was anesthetized with 1% lidocaine. Maximal barrier sterile technique was utilized including caps, mask, sterile gowns, sterile gloves, sterile drape, hand hygiene and skin antiseptic. A small incision was made with #11 blade scalpel. A 21 gauge needle directed into the right internal jugular vein with ultrasound guidance. A micropuncture dilator set was placed. A 19 cm tip to cuff  Palindrome catheter was selected. The skin below the right clavicle was anesthetized and a small incision was made with an #11 blade scalpel. A subcutaneous tunnel was formed to the vein dermatotomy site. The catheter was brought through the tunnel. The vein dermatotomy site was dilated to accommodate a peel-away sheath. The catheter was placed through the peel-away sheath and directed into the central venous structures. The tip of the catheter was placed at the SVC and right atrium junction with fluoroscopy. Fluoroscopic images were obtained for documentation. Both lumens were found to aspirate and flush well. The proper amount of heparin was flushed in both lumens. The vein dermatotomy site was closed using a single layer of absorbable suture and Dermabond. Gel-Foam was placed in the subcutaneous tract. The catheter was secured to the skin using Prolene suture. IMPRESSION: Successful placement of a right jugular tunneled dialysis catheter using ultrasound and fluoroscopic guidance. Electronically Signed   By: Markus Daft M.D.   On: 08/21/2018 16:14   Vas [place] Upper Ext Vein Mapping (pre-op Avf)  Result Date: 08/22/2018 UPPER EXTREMITY VEIN MAPPING  Indications: Pre-Dialysis access. Limitations: IV/bandages Comparison Study: No prior study on file for comparison Performing Technologist: Sharion Dove RVS  Examination Guidelines: A complete evaluation includes B-mode imaging, spectral Doppler, color Doppler, and power Doppler as needed of all accessible portions of each vessel. Bilateral testing is considered an integral part of a complete examination. Limited examinations for reoccurring indications may be performed as noted. +-----------------+-------------+----------+---------+  Right Cephalic    Diameter (cm) Depth (cm) Findings   +-----------------+-------------+----------+---------+  Prox upper arm        0.46         0.61               +-----------------+-------------+----------+---------+  Mid upper arm          0.45         0.33               +-----------------+-------------+----------+---------+  Dist upper arm        0.43         0.29               +-----------------+-------------+----------+---------+  Antecubital fossa     0.63         0.14               +-----------------+-------------+----------+---------+  Prox forearm          0.39         0.24    branching  +-----------------+-------------+----------+---------+  Mid forearm           0.41         0.24    branching  +-----------------+-------------+----------+---------+  Wrist  0.31         0.20               +-----------------+-------------+----------+---------+ +-----------------+-------------+----------+--------------+  Left Cephalic     Diameter (cm) Depth (cm)    Findings     +-----------------+-------------+----------+--------------+  Shoulder                                   not visualized  +-----------------+-------------+----------+--------------+  Prox upper arm                             not visualized  +-----------------+-------------+----------+--------------+  Mid upper arm                              not visualized  +-----------------+-------------+----------+--------------+  Dist upper arm        0.41         1.53                    +-----------------+-------------+----------+--------------+  Antecubital fossa     0.36         3.44                    +-----------------+-------------+----------+--------------+  Prox forearm                               not visualized  +-----------------+-------------+----------+--------------+  Mid forearm                                not visualized  +-----------------+-------------+----------+--------------+  Wrist                                      not visualized  +-----------------+-------------+----------+--------------+ +-----------------+-------------+----------+---------+  Left Basilic      Diameter (cm) Depth (cm) Findings   +-----------------+-------------+----------+---------+   Prox upper arm        0.44         1.55               +-----------------+-------------+----------+---------+  Mid upper arm         0.49         1.01               +-----------------+-------------+----------+---------+  Dist upper arm        0.49         0.89    branching  +-----------------+-------------+----------+---------+  Antecubital fossa     0.44         0.90               +-----------------+-------------+----------+---------+  Prox forearm          0.46         1.19    branching  +-----------------+-------------+----------+---------+  Mid forearm           0.42         1.46               +-----------------+-------------+----------+---------+  Wrist                 0.18         0.13  branching  +-----------------+-------------+----------+---------+ *See table(s) above for measurements and observations.  Diagnosing physician: Monica Martinez MD Electronically signed by Monica Martinez MD on 08/22/2018 at 3:10:20 PM.    Final         Scheduled Meds:  heparin       sodium chloride   Intravenous Once   atorvastatin  10 mg Oral QHS   Chlorhexidine Gluconate Cloth  6 each Topical Q0600   [START ON 08/28/2018] darbepoetin (ARANESP) injection - DIALYSIS  60 mcg Intravenous Q Sat-HD   DULoxetine  60 mg Oral QHS   feeding supplement (PRO-STAT SUGAR FREE 64)  30 mL Oral BID   labetalol  200 mg Oral BID   multivitamin  1 tablet Oral QHS   NIFEdipine  30 mg Oral QHS   sevelamer carbonate  800 mg Oral TID WC   Continuous Infusions:  sodium chloride     sodium chloride     sodium chloride     sodium chloride     [START ON 08/24/2018]  ceFAZolin (ANCEF) IV       LOS: 3 days     Georgette Shell, MD [facility] If 7PM-7AM, please contact night-coverage [email or web address] Password [facility] 08/23/2018, 11:44 AM

## 2018-08-23 NOTE — Progress Notes (Addendum)
[facility]    NEPHROLOGY PROGRESS NOTE  SUBJECTIVE: Patient undergoing second dialysis treatment today.  Patient seen and examined.  Denies any fevers, chills, chest pain, shortness of breath, dysuria, hematuria, or myalgias.  Denies any nausea.  All other review of systems are negative.   OBJECTIVE:  Vitals:   08/23/18 0855 08/23/18 0900  BP: (!) 148/78 (!) 160/84  Pulse: 67 66  Resp: 18 19  Temp: 98.4 F (36.9 C)   SpO2: 100% 100%    Intake/Output Summary (Last 24 hours) at 08/23/2018 4081 Last data filed at 08/23/2018 0600 Gross per 24 hour  Intake 720 ml  Output 550 ml  Net 170 ml      General:  AAOx3 NAD HEENT: MMM [place] AT anicteric sclera Neck:  No JVD, no adenopathy CV:  Heart RRR  Lungs:  L/S CTA bilaterally Abd:  abd SNT/ND with normal BS GU:  Bladder non-palpable Extremities:  No LE edema. Skin:  No skin rash  MEDICATIONS:  . sodium chloride   Intravenous Once  . atorvastatin  10 mg Oral QHS  . Chlorhexidine Gluconate Cloth  6 each Topical Q0600  . [START ON 08/28/2018] darbepoetin (ARANESP) injection - DIALYSIS  60 mcg Intravenous Q Sat-HD  . DULoxetine  60 mg Oral QHS  . feeding supplement (PRO-STAT SUGAR FREE 64)  30 mL Oral BID  . labetalol  200 mg Oral BID  . multivitamin  1 tablet Oral QHS  . NIFEdipine  30 mg Oral QHS  . sevelamer carbonate  800 mg Oral TID WC  . sodium bicarbonate  650 mg Oral BID     LABS:   CBC Latest Ref Rng & Units 08/23/2018 08/22/2018 08/20/2018  WBC 4.0 - 10.5 K/uL 7.2 7.9 7.0  Hemoglobin 12.0 - 15.0 g/dL 6.0(LL) 6.3(LL) 7.0(L)  Hematocrit 36.0 - 46.0 % 19.5(L) 20.0(L) 22.8(L)  Platelets 150 - 400 K/uL 243 294 350    CMP Latest Ref Rng & Units 08/23/2018 08/21/2018 08/20/2018  Glucose 70 - 99 mg/dL 90 129(H) 165(H)  BUN 8 - 23 mg/dL 81(H) 136(H) 138(H)  Creatinine 0.44 - 1.00 mg/dL 12.15(H) 17.79(H) 18.80(H)  Sodium 135 - 145 mmol/L 136 136 134(L)  Potassium 3.5 - 5.1 mmol/L 3.9 4.3 4.8  Chloride 98 -  111 mmol/L 104 105 98  CO2 22 - 32 mmol/L 22 17(L) 18(L)  Calcium 8.9 - 10.3 mg/dL 7.9(L) 8.3(L) 9.2  Total Protein 6.5 - 8.1 g/dL - - 6.3(L)  Total Bilirubin 0.3 - 1.2 mg/dL - - 0.8  Alkaline Phos 38 - 126 U/L - - 47  AST 15 - 41 U/L - - 9(L)  ALT 0 - 44 U/L - - 10    Lab Results  Component Value Date   CALCIUM 7.9 (L) 08/23/2018   PHOS 6.2 (H) 08/21/2018       Component Value Date/Time   COLORURINE YELLOW 08/20/2018 2040   APPEARANCEUR HAZY (A) 08/20/2018 2040   LABSPEC 1.012 08/20/2018 2040   PHURINE 5.0 08/20/2018 2040   GLUCOSEU NEGATIVE 08/20/2018 2040   HGBUR SMALL (A) 08/20/2018 2040   BILIRUBINUR NEGATIVE 08/20/2018 2040   KETONESUR NEGATIVE 08/20/2018 2040   PROTEINUR 100 (A) 08/20/2018 2040   NITRITE NEGATIVE 08/20/2018 2040   LEUKOCYTESUR LARGE (A) 08/20/2018 2040   No results found for: PHART, PCO2ART, PO2ART, HCO3, TCO2, ACIDBASEDEF, O2SAT     Component Value Date/Time   IRON 39 08/21/2018 0113   TIBC 204 (L) 08/21/2018 0113   FERRITIN 349 (H)  08/21/2018 0113   IRONPCTSAT 19 08/21/2018 0113       ASSESSMENT/PLAN:      The patient is a 73 y.o. year old female patient with a past medical history significant for stage V chronic kidney disease who presented from her appointment with Dr. Justin Mend after 2 episodes of syncope in the lobby.  She reports feeling weak for quite some time.  In the emergency department, she was noted to have a creatinine of 18.8.   1.    End-stage renal disease.  Second dialysis treatment today.   For permanent access tomorrow.  2.  Anemia.  Hemoccult was negative.  Likely secondary to chronic kidney disease.    Will continue Aranesp and IV iron for low iron sat.  Is refusing blood transfusions due to religious preference.  3.  Syncope.  Suspect hemodynamic in nature.  Stable hemodynamics today.  4.  Hypertension.  Blood pressure currently is stable.  Will continue current therapy.  5.  Metabolic acidosis.    Discontinue sodium  bicarbonate.  Will likely be able to manage with dialysis.  6.  Metabolic bone disease.  Check intact PTH which is pending.  Phosphorus elevated at 6.2.  We will continue phosphate binder.    [place], DO, [place]

## 2018-08-23 NOTE — Care Management Important Message (Signed)
Important Message  Patient Details  Name: Bianca Myers MRN: [id number] Date of Birth: [birth year]   Medicare Important Message Given:  Yes     [name] 08/23/2018, 12:48 PM

## 2018-08-23 NOTE — Progress Notes (Signed)
[facility]  Subjective  - No complaints.   Objective (!) 158/86 65 98.4 F (36.9 C) (Oral) 18 100%  Intake/Output Summary (Last 24 hours) at 08/23/2018 0740 Last data filed at 08/23/2018 0600 Gross per 24 hour  Intake 960 ml  Output 750 ml  Net 210 ml    Left radial and brachial pulse palpable  Laboratory Lab Results: Recent Labs    08/22/18 0104 08/23/18 0528  WBC 7.9 7.2  HGB 6.3* 6.0*  HCT 20.0* 19.5*  PLT 294 243   BMET Recent Labs    08/21/18 0113 08/23/18 0528  NA 136 136  K 4.3 3.9  CL 105 104  CO2 17* 22  GLUCOSE 129* 90  BUN 136* 81*  CREATININE 17.79* 12.15*  CALCIUM 8.3* 7.9*    COAG Lab Results  Component Value Date   INR 1.1 08/21/2018   No results found for: PTT  Assessment/Planning:  Plan for left arm AVF tomorrow with vascular surgery.  Please keep NPO after midnight.  Vein mapping suggests good basilic vein, will evaluate cephalic vein as well.  Marty Heck 08/23/2018 7:40 AM --

## 2018-08-24 ENCOUNTER — Inpatient Hospital Stay (HOSPITAL_COMMUNITY): Payer: Medicare HMO | Admitting: Anesthesiology

## 2018-08-24 ENCOUNTER — Encounter (HOSPITAL_COMMUNITY)
Admission: EM | Disposition: A | Discharge status: Home / Self Care | Payer: Self-pay | Source: Home / Self Care | Attending: Internal Medicine

## 2018-08-24 DIAGNOSIS — N185 Chronic kidney disease, stage 5: Secondary | ICD-10-CM

## 2018-08-24 HISTORY — PX: AV FISTULA PLACEMENT: SHX1204

## 2018-08-24 LAB — BASIC METABOLIC PANEL
Anion gap: 10 (ref 5–15)
BUN: 37 mg/dL — ABNORMAL HIGH (ref 8–23)
CO2: 25 mmol/L (ref 22–32)
Calcium: 7.7 mg/dL — ABNORMAL LOW (ref 8.9–10.3)
Chloride: 102 mmol/L (ref 98–111)
Creatinine, Ser: 6.82 mg/dL — ABNORMAL HIGH (ref 0.44–1.00)
GFR calc Af Amer: 6 mL/min — ABNORMAL LOW (ref >60)
GFR calc non Af Amer: 6 mL/min — ABNORMAL LOW (ref >60)
Glucose, Bld: 104 mg/dL — ABNORMAL HIGH (ref 70–99)
Potassium: 3.4 mmol/L — ABNORMAL LOW (ref 3.5–5.1)
Sodium: 137 mmol/L (ref 135–145)

## 2018-08-24 LAB — CBC
HCT: 19.7 % — ABNORMAL LOW (ref 36.0–46.0)
Hemoglobin: 5.9 g/dL — CL (ref 12.0–15.0)
MCH: 26.5 pg (ref 26.0–34.0)
MCHC: 29.9 g/dL — ABNORMAL LOW (ref 30.0–36.0)
MCV: 88.3 fL (ref 80.0–100.0)
Platelets: 214 10*3/uL (ref 150–400)
RBC: 2.23 MIL/uL — ABNORMAL LOW (ref 3.87–5.11)
RDW: 16.8 % — ABNORMAL HIGH (ref 11.5–15.5)
WBC: 7.9 10*3/uL (ref 4.0–10.5)
nRBC: 0 % (ref 0.0–0.2)

## 2018-08-24 LAB — HEPATITIS B SURFACE ANTIBODY,QUALITATIVE
Hep B S Ab: NONREACTIVE
Hep B S Ab: NONREACTIVE

## 2018-08-24 LAB — GLUCOSE, CAPILLARY: Glucose-Capillary: 123 mg/dL — ABNORMAL HIGH (ref 70–99)

## 2018-08-24 LAB — PROTIME-INR
INR: 1.2 (ref 0.8–1.2)
Prothrombin Time: 14.8 seconds (ref 11.4–15.2)

## 2018-08-24 LAB — HEPATITIS B CORE ANTIBODY, TOTAL
Hep B Core Total Ab: NEGATIVE
Hep B Core Total Ab: NEGATIVE

## 2018-08-24 LAB — HEPATITIS B SURFACE ANTIGEN: Hepatitis B Surface Ag: NEGATIVE

## 2018-08-24 SURGERY — ARTERIOVENOUS (AV) FISTULA CREATION
Anesthesia: Monitor Anesthesia Care | Site: Arm Upper | Laterality: Left

## 2018-08-24 MED ORDER — DEXMEDETOMIDINE HCL 200 MCG/2ML IV SOLN
INTRAVENOUS | Status: DC | PRN
  Administered 2018-08-24 (×4): 4 ug via INTRAVENOUS

## 2018-08-24 MED ORDER — 0.9 % SODIUM CHLORIDE (POUR BTL) OPTIME
TOPICAL | Status: DC | PRN
  Administered 2018-08-24: 1000 mL

## 2018-08-24 MED ORDER — FENTANYL CITRATE (PF) 250 MCG/5ML IJ SOLN
INTRAMUSCULAR | Status: AC
  Filled 2018-08-24: qty 5

## 2018-08-24 MED ORDER — SODIUM CHLORIDE 0.9 % IV SOLN
INTRAVENOUS | Status: AC
  Filled 2018-08-24: qty 1.2

## 2018-08-24 MED ORDER — LIDOCAINE HCL (PF) 1 % IJ SOLN
INTRAMUSCULAR | Status: AC
  Filled 2018-08-24: qty 30

## 2018-08-24 MED ORDER — ONDANSETRON HCL 4 MG/2ML IJ SOLN
INTRAMUSCULAR | Status: DC | PRN
  Administered 2018-08-24: 4 mg via INTRAVENOUS

## 2018-08-24 MED ORDER — SODIUM CHLORIDE 0.9 % IV SOLN
INTRAVENOUS | Status: DC
  Administered 2018-08-24 (×2): via INTRAVENOUS

## 2018-08-24 MED ORDER — FENTANYL CITRATE (PF) 100 MCG/2ML IJ SOLN: 25.0000 ug | INTRAMUSCULAR | Status: DC | PRN

## 2018-08-24 MED ORDER — SODIUM CHLORIDE 0.9 % IV SOLN
INTRAVENOUS | Status: DC | PRN
  Administered 2018-08-24: 500 mL

## 2018-08-24 MED ORDER — OXYCODONE HCL 5 MG PO TABS: 5.0000 mg | ORAL_TABLET | Freq: Once | ORAL | Status: DC | PRN

## 2018-08-24 MED ORDER — ONDANSETRON HCL 4 MG/2ML IJ SOLN: 4.0000 mg | Freq: Once | INTRAMUSCULAR | Status: DC | PRN

## 2018-08-24 MED ORDER — LIDOCAINE-EPINEPHRINE (PF) 1 %-1:200000 IJ SOLN
INTRAMUSCULAR | Status: DC | PRN
  Administered 2018-08-24: 30 mL

## 2018-08-24 MED ORDER — HYDROCODONE-ACETAMINOPHEN 5-325 MG PO TABS
1.0000 | ORAL_TABLET | ORAL | Status: DC | PRN
  Administered 2018-08-24 (×2): 1 via ORAL
  Administered 2018-08-25 – 2018-08-27 (×7): 2 via ORAL
  Filled 2018-08-24 (×7): qty 2
  Filled 2018-08-24 (×2): qty 1

## 2018-08-24 MED ORDER — MIDAZOLAM HCL 2 MG/2ML IJ SOLN
INTRAMUSCULAR | Status: DC | PRN
  Administered 2018-08-24 (×2): 1 mg via INTRAVENOUS

## 2018-08-24 MED ORDER — OXYCODONE HCL 5 MG/5ML PO SOLN: 5.0000 mg | Freq: Once | ORAL | Status: DC | PRN

## 2018-08-24 MED ORDER — SEVELAMER CARBONATE 800 MG PO TABS
800.0000 mg | ORAL_TABLET | Freq: Three times a day (TID) | ORAL | Status: DC
  Administered 2018-08-24 – 2018-08-26 (×5): 800 mg via ORAL
  Filled 2018-08-24 (×5): qty 1

## 2018-08-24 MED ORDER — CHLORHEXIDINE GLUCONATE CLOTH 2 % EX PADS
6.0000 | MEDICATED_PAD | Freq: Every day | CUTANEOUS | Status: DC
  Administered 2018-08-26: 6 via TOPICAL

## 2018-08-24 MED ORDER — MIDAZOLAM HCL 2 MG/2ML IJ SOLN
INTRAMUSCULAR | Status: AC
  Filled 2018-08-24: qty 2

## 2018-08-24 MED ORDER — LIDOCAINE-EPINEPHRINE (PF) 1 %-1:200000 IJ SOLN
INTRAMUSCULAR | Status: AC
  Filled 2018-08-24: qty 30

## 2018-08-24 MED ORDER — FENTANYL CITRATE (PF) 250 MCG/5ML IJ SOLN
INTRAMUSCULAR | Status: DC | PRN
  Administered 2018-08-24: 25 ug via INTRAVENOUS
  Administered 2018-08-24: 50 ug via INTRAVENOUS
  Administered 2018-08-24: 25 ug via INTRAVENOUS

## 2018-08-24 SURGICAL SUPPLY — 35 items
ARMBAND PINK RESTRICT EXTREMIT (MISCELLANEOUS) ×3 IMPLANT
CANISTER SUCT 3000ML PPV (MISCELLANEOUS) ×3 IMPLANT
CLIP VESOCCLUDE MED 6/CT (CLIP) ×3 IMPLANT
CLIP VESOCCLUDE SM WIDE 6/CT (CLIP) ×3 IMPLANT
COVER PROBE W GEL 5X96 (DRAPES) IMPLANT
COVER WAND RF STERILE (DRAPES) ×3 IMPLANT
DERMABOND ADVANCED (GAUZE/BANDAGES/DRESSINGS) ×2
DERMABOND ADVANCED .7 DNX12 (GAUZE/BANDAGES/DRESSINGS) ×1 IMPLANT
ELECT REM PT RETURN 9FT ADLT (ELECTROSURGICAL) ×3
ELECTRODE REM PT RTRN 9FT ADLT (ELECTROSURGICAL) ×1 IMPLANT
GLOVE BIO SURGEON STRL SZ7.5 (GLOVE) ×5 IMPLANT
GLOVE BIOGEL PI IND STRL 6.5 (GLOVE) IMPLANT
GLOVE BIOGEL PI IND STRL 7.0 (GLOVE) IMPLANT
GLOVE BIOGEL PI INDICATOR 6.5 (GLOVE) ×2
GLOVE BIOGEL PI INDICATOR 7.0 (GLOVE) ×2
GLOVE SURG SS PI 6.5 STRL IVOR (GLOVE) ×2 IMPLANT
GOWN STRL REUS W/ TWL LRG LVL3 (GOWN DISPOSABLE) ×2 IMPLANT
GOWN STRL REUS W/ TWL XL LVL3 (GOWN DISPOSABLE) ×1 IMPLANT
GOWN STRL REUS W/TWL LRG LVL3 (GOWN DISPOSABLE) ×4
GOWN STRL REUS W/TWL XL LVL3 (GOWN DISPOSABLE) ×2
INSERT FOGARTY SM (MISCELLANEOUS) IMPLANT
KIT BASIN OR (CUSTOM PROCEDURE TRAY) ×3 IMPLANT
KIT TURNOVER KIT B (KITS) ×3 IMPLANT
NS IRRIG 1000ML POUR BTL (IV SOLUTION) ×3 IMPLANT
PACK CV ACCESS (CUSTOM PROCEDURE TRAY) ×3 IMPLANT
PAD ARMBOARD 7.5X6 YLW CONV (MISCELLANEOUS) ×6 IMPLANT
STOCKINETTE 6  STRL (DRAPES) ×2
STOCKINETTE 6 STRL (DRAPES) IMPLANT
SUT MNCRL AB 4-0 PS2 18 (SUTURE) ×3 IMPLANT
SUT PROLENE 6 0 BV (SUTURE) ×3 IMPLANT
SUT VIC AB 3-0 SH 27 (SUTURE) ×2
SUT VIC AB 3-0 SH 27X BRD (SUTURE) ×1 IMPLANT
TOWEL GREEN STERILE (TOWEL DISPOSABLE) ×3 IMPLANT
UNDERPAD 30X30 (UNDERPADS AND DIAPERS) ×3 IMPLANT
WATER STERILE IRR 1000ML POUR (IV SOLUTION) ×3 IMPLANT

## 2018-08-24 NOTE — Progress Notes (Signed)
PROGRESS NOTE    Bianca Myers  UXN:[id number] DOB: 1945-03-15 DOA: 08/20/2018 PCP: [name], MD  Brief Narrative:72 y.o.female,with past medical history significant for chronic kidney disease stage V who is presenting to the emergency department for a syncopal episode earlier today. That the patient was on her way to her nephrologist office when this happened. After further questioning the patient reports similar episodes for the last 2 days and she had another episode in the lobby today. Patient reports 1 year history of feeling weak and has history for chronic kidney disease stage V. The patient was advised to start dialysis by her primary nephrologist but refused. Patient denies any preceding chest pains palpitations, shortness of breath, nausea vomiting or diarrhea. In the emergency room patient's troponin was slightly elevated at 0.04 with a creatinine of 18.8, potassium 4.8. Her hemoglobin was 7. Her fecal occult blood was negative .CT of the head was negative for acute events but small lacunar infarcts in the right basal ganglia were noted with age-related cerebral atrophy.  08/22/2018 patient does not want any blood products as she is Jehovah witness. And she is very adamant about it. She had her first dialysis treatment yesterday. She denies any new complaints today she denies any nausea vomiting. Assessment & Plan:  08/23/2018 patient seen at dialysis has no new complaints she feels better   Briefly this patient was admitted with CKD stage V with a creatinine of 17 went on to have dialysis for the first time this admission.  She is tolerating dialysis well.  However she is a Jehovah witness so she does not want to take any blood or blood products.  She has received Feraheme.   Assessment & Plan:   Active Problems:   Syncope   CKD (chronic kidney disease) requiring chronic dialysis (HCC)   Creatinine elevation   Anemia   1 chronic kidney disease stage V  with metabolic acidosis-patient is receiving her second dialysis 6/22 . Continue sodium bicarbonate.AVF placed 08/24/2018 -Social worker working to clip for outpatient dialysis.  #2 syncope possibly secondary to intravascular depletion  #3 elevated troponin no acute EKG changes most likely secondary to CKD  #4 hypertension continue labetalol and Procardia  #5 anxiety and depression continue Cymbalta  #6 hyperlipidemia continue statin  #7 anemia of chronic disease most likely secondary to CKD with low iron 39. FOBT negative.Hemoglobin5.9 this morning patient absolutely refuses any blood products.   DVT prophylaxis SCD CODE STATUS full Disposition pending clinical improvement and renal clearance and outpatient dialysis needs to be set up prior to discharge SW working on it. Family communication called her sonGermaine with no answer       Nutrition Problem: Increased nutrient needs Etiology: chronic illness(ESRD on HD)     Signs/Symptoms: estimated needs    Interventions: Prostat, MVI  Estimated body mass index is 28.01 kg/m as calculated from the following:   Height as of this encounter: 5' 6.5" (1.689 m).   Weight as of this encounter: 79.9 kg.  Subjective: Patient resting in bed no complaints no nausea vomiting very pleasant young lady  Objective: Vitals:   08/24/18 1112 08/24/18 1127 08/24/18 1136 08/24/18 1151  BP: (!) 143/73 (!) 147/74 (!) 141/73 124/70  Pulse: 61 (!) 57 (!) 58 (!) 59  Resp: 12 20 14 16   Temp: 97.7 F (36.5 C)  97.6 F (36.4 C) 98.3 F (36.8 C)  TempSrc:      SpO2: 99% 100% 100% 98%  Weight:  Height:        Intake/Output Summary (Last 24 hours) at 08/24/2018 1229 Last data filed at 08/24/2018 1100 Gross per 24 hour  Intake 620 ml  Output 25 ml  Net 595 ml   Filed Weights   08/23/18 0855 08/23/18 1200 08/24/18 0433  Weight: 81.2 kg 80 kg 79.9 kg    Examination:  General exam: Appears calm and comfortable   Respiratory system: Clear to auscultation. Respiratory effort normal. Cardiovascular system: S1 & S2 heard, RRR. No JVD, murmurs, rubs, gallops or clicks. No pedal edema. Gastrointestinal system: Abdomen is nondistended, soft and nontender. No organomegaly or masses felt. Normal bowel sounds heard. Central nervous system: Alert and oriented. No focal neurological deficits. Extremities: Minimal edema Skin: No rashes, lesions or ulcers Psychiatry: Judgement and insight appear normal. Mood & affect appropriate.     Data Reviewed: I have personally reviewed following labs and imaging studies  CBC: Recent Labs  Lab 08/20/18 1324 08/22/18 0104 08/23/18 0528 08/24/18 0542  WBC 7.0 7.9 7.2 7.9  NEUTROABS 5.5  --   --   --   HGB 7.0* 6.3* 6.0* 5.9*  HCT 22.8* 20.0* 19.5* 19.7*  MCV 88.0 87.0 88.2 88.3  PLT 350 294 243 381   Basic Metabolic Panel: Recent Labs  Lab 08/20/18 1324 08/21/18 0113 08/23/18 0528 08/24/18 0542  NA 134* 136 136 137  K 4.8 4.3 3.9 3.4*  CL 98 105 104 102  CO2 18* 17* 22 25  GLUCOSE 165* 129* 90 104*  BUN 138* 136* 81* 37*  CREATININE 18.80* 17.79* 12.15* 6.82*  CALCIUM 9.2 8.3* 7.9* 7.7*  PHOS  --  6.2*  --   --    GFR: Estimated Creatinine Clearance: 8 mL/min (A) (by C-G formula based on SCr of 6.82 mg/dL (H)). Liver Function Tests: Recent Labs  Lab 08/20/18 1324  AST 9*  ALT 10  ALKPHOS 47  BILITOT 0.8  PROT 6.3*  ALBUMIN 3.3*   No results for input(s): LIPASE, AMYLASE in the last 168 hours. No results for input(s): AMMONIA in the last 168 hours. Coagulation Profile: Recent Labs  Lab 08/21/18 1145 08/24/18 0542  INR 1.1 1.2   Cardiac Enzymes: Recent Labs  Lab 08/20/18 1324 08/20/18 1910 08/21/18 0113 08/21/18 0827  TROPONINI 0.04* 0.03* 0.04* 0.04*   BNP (last 3 results) No results for input(s): PROBNP in the last 8760 hours. HbA1C: No results for input(s): HGBA1C in the last 72 hours. CBG: Recent Labs  Lab 08/24/18  1112  GLUCAP 123*   Lipid Profile: No results for input(s): CHOL, HDL, LDLCALC, TRIG, CHOLHDL, LDLDIRECT in the last 72 hours. Thyroid Function Tests: No results for input(s): TSH, T4TOTAL, FREET4, T3FREE, THYROIDAB in the last 72 hours. Anemia Panel: No results for input(s): VITAMINB12, FOLATE, FERRITIN, TIBC, IRON, RETICCTPCT in the last 72 hours. Sepsis Labs: No results for input(s): PROCALCITON, LATICACIDVEN in the last 168 hours.  Recent Results (from the past 240 hour(s))  SARS Coronavirus 2     Status: None   Collection Time: 08/20/18  3:11 PM  Result Value Ref Range Status   SARS Coronavirus 2 NOT DETECTED NOT DETECTED Final    Comment: (NOTE) SARS-CoV-2 target nucleic acids are NOT DETECTED. The SARS-CoV-2 RNA is generally detectable in upper and lower respiratory specimens during the acute phase of infection.  Negative  results do not preclude SARS-CoV-2 infection, do not rule out co-infections with other pathogens, and should not be used as the sole basis for treatment or  other patient management decisions.  Negative results must be combined with clinical observations, patient history, and epidemiological information. The expected result is Not Detected. Fact Sheet for Patients: http://www.biofiredefense.com/wp-content/uploads/2020/03/BIOFIRE-COVID -19-patients.pdf Fact Sheet for Healthcare Providers: http://www.biofiredefense.com/wp-content/uploads/2020/03/BIOFIRE-COVID -19-hcp.pdf This test is not yet approved or cleared by the [place] and  has been authorized for detection and/or diagnosis of SARS-CoV-2 by FDA under an Emergency Use Authorization (EUA).  This EUA will remain in effec t (meaning this test can be used) for the duration of  the COVID-19 declaration under Section 564(b)(1) of the Act, 21 U.S.C. section 360bbb-3(b)(1), unless the authorization is terminated or revoked sooner. Performed at [facility], [place] [address]  [place], [place] [address]          Radiology Studies: No results found.      Scheduled Meds: . sodium chloride   Intravenous Once  . atorvastatin  10 mg Oral QHS  . Chlorhexidine Gluconate Cloth  6 each Topical Q0600  . [START ON 08/28/2018] darbepoetin (ARANESP) injection - DIALYSIS  60 mcg Intravenous Q Sat-HD  . DULoxetine  60 mg Oral QHS  . feeding supplement (PRO-STAT SUGAR FREE 64)  30 mL Oral BID  . labetalol  200 mg Oral BID  . multivitamin  1 tablet Oral QHS  . NIFEdipine  30 mg Oral QHS  . sevelamer carbonate  800 mg Oral TID WC   Continuous Infusions: . sodium chloride 10 mL/hr at 08/24/18 0849     LOS: 4 days     Georgette Shell, MD [facility]  If 7PM-7AM, please contact night-coverage [email or web address] Password [facility] 08/24/2018, 12:29 PM

## 2018-08-24 NOTE — Progress Notes (Signed)
[facility]    NEPHROLOGY PROGRESS NOTE  SUBJECTIVE:  S/p HD on 6/22.  She has an outpatient unit MWF but doesn't yet have transportation.  She had a left arm AVF placed today.  Review of systems:  Shortness of breath with exertion  Denies n/v  Post-op discomfort  OBJECTIVE:  Vitals:   08/24/18 1151 08/24/18 1242  BP: 124/70 133/73  Pulse: (!) 59 (!) 59  Resp: 16 16  Temp: 98.3 F (36.8 C) 98.2 F (36.8 C)  SpO2: 98% 97%    Intake/Output Summary (Last 24 hours) at 08/24/2018 1731 Last data filed at 08/24/2018 1100 Gross per 24 hour  Intake 620 ml  Output 25 ml  Net 595 ml      General:  AAOx3 NAD  HEENT: MMM  AT anicteric sclera Neck:  No JVD, no adenopathy CV:  Heart RRR  Lungs:  L/S CTA bilaterally Abd:  abd SNT/ND with normal BS GU:  Bladder non-palpable Extremities:  No LE edema. Skin:  No skin rash Access: right chest tunneled catheter; LUE AVF with bruit   MEDICATIONS:  . sodium chloride   Intravenous Once  . atorvastatin  10 mg Oral QHS  . Chlorhexidine Gluconate Cloth  6 each Topical Q0600  . [START ON 08/28/2018] darbepoetin (ARANESP) injection - DIALYSIS  60 mcg Intravenous Q Sat-HD  . DULoxetine  60 mg Oral QHS  . feeding supplement (PRO-STAT SUGAR FREE 64)  30 mL Oral BID  . labetalol  200 mg Oral BID  . multivitamin  1 tablet Oral QHS  . NIFEdipine  30 mg Oral QHS  . sevelamer carbonate  800 mg Oral TID WC     LABS:   CBC Latest Ref Rng & Units 08/24/2018 08/23/2018 08/22/2018  WBC 4.0 - 10.5 K/uL 7.9 7.2 7.9  Hemoglobin 12.0 - 15.0 g/dL 5.9(LL) 6.0(LL) 6.3(LL)  Hematocrit 36.0 - 46.0 % 19.7(L) 19.5(L) 20.0(L)  Platelets 150 - 400 K/uL 214 243 294    CMP Latest Ref Rng & Units 08/24/2018 08/23/2018 08/21/2018  Glucose 70 - 99 mg/dL 104(H) 90 129(H)  BUN 8 - 23 mg/dL 37(H) 81(H) 136(H)  Creatinine 0.44 - 1.00 mg/dL 6.82(H) 12.15(H) 17.79(H)  Sodium 135 - 145 mmol/L 137 136 136  Potassium 3.5 - 5.1 mmol/L 3.4(L) 3.9 4.3   Chloride 98 - 111 mmol/L 102 104 105  CO2 22 - 32 mmol/L 25 22 17(L)  Calcium 8.9 - 10.3 mg/dL 7.7(L) 7.9(L) 8.3(L)  Total Protein 6.5 - 8.1 g/dL - - -  Total Bilirubin 0.3 - 1.2 mg/dL - - -  Alkaline Phos 38 - 126 U/L - - -  AST 15 - 41 U/L - - -  ALT 0 - 44 U/L - - -    Lab Results  Component Value Date   PTH 16 08/21/2018   CALCIUM 7.7 (L) 08/24/2018   PHOS 6.2 (H) 08/21/2018       Component Value Date/Time   COLORURINE YELLOW 08/20/2018 2040   APPEARANCEUR HAZY (A) 08/20/2018 2040   LABSPEC 1.012 08/20/2018 2040   PHURINE 5.0 08/20/2018 2040   GLUCOSEU NEGATIVE 08/20/2018 2040   HGBUR SMALL (A) 08/20/2018 2040   [place] NEGATIVE 08/20/2018 2040   [place] NEGATIVE 08/20/2018 2040   PROTEINUR 100 (A) 08/20/2018 2040   NITRITE NEGATIVE 08/20/2018 2040   LEUKOCYTESUR LARGE (A) 08/20/2018 2040   No results found for: PHART, PCO2ART, PO2ART, HCO3, TCO2, ACIDBASEDEF, O2SAT     Component Value Date/Time   IRON 39  08/21/2018 0113   TIBC 204 (L) 08/21/2018 0113   FERRITIN 349 (H) 08/21/2018 0113   IRONPCTSAT 19 08/21/2018 0113       ASSESSMENT/PLAN:      The patient is a 73 y.o. year old female patient with a past medical history significant for stage V chronic kidney disease who presented from her appointment with Dr. Justin Mend after 2 episodes of syncope in the lobby.  She reports feeling weak for quite some time.  In the emergency department, she was noted to have a creatinine of 18.8.     1.    End-stage renal disease.  - Left arm AVF placed today.  Note plans for vascular follow-up for 6 weeks for dialysis duplex to plan second stage procedure.  - Plan for HD per MWF schedule.  Doesn't yet have transportation for dialysis but has a spot [facility] MWF 1st shift at 6:50 am.  - Would choose alternate to cymbalta given her ESRD   2.  Anemia.  Profound.  Hemoccult was negative.  Likely secondary to chronic kidney disease.    Will continue Aranesp and IV iron for low iron  sat.  Is refusing blood transfusions due to religious preference. Would minimize lab draws - I have discontinued daily labs for now.  Her hemoglobin is not currently stable for discharge for outpatient HD. Repeat CBC on 6/25.  3.  Syncope.  Suspect hemodynamic in nature.  Stable hemodynamics today.  4.  Hypertension.  Blood pressure currently is stable.    5.  Metabolic acidosis.  on HD  6.  Metabolic bone disease.  Check intact PTH which is pending.  Phosphorus elevated at 6.2.  We will continue phosphate binder.

## 2018-08-24 NOTE — Anesthesia Preprocedure Evaluation (Addendum)
Anesthesia Evaluation  Patient identified by MRN, date of birth, ID band Patient awake    Reviewed: Allergy & Precautions, NPO status , Patient's Chart, lab work & pertinent test results  History of Anesthesia Complications Negative for: history of anesthetic complications  Airway Mallampati: II  TM Distance: >3 FB Neck ROM: Full    Dental  (+) Teeth Intact   Pulmonary neg pulmonary ROS, former smoker,    Pulmonary exam normal        Cardiovascular hypertension, Normal cardiovascular exam     Neuro/Psych negative neurological ROS  negative psych ROS   GI/Hepatic negative GI ROS, Neg liver ROS,   Endo/Other  diabetes  Renal/GU ESRF and DialysisRenal disease  negative genitourinary   Musculoskeletal negative musculoskeletal ROS (+)   Abdominal   Peds  Hematology  (+) anemia , REFUSES BLOOD PRODUCTS, JEHOVAH'S WITNESS  Anesthesia Other Findings ESRD on HD, severe anemia (Hgb 5.9, Jehovah's Witness- refuses blood transfusion), recent recurrent syncopal episodes K 3.4 this AM  Reproductive/Obstetrics                            Anesthesia Physical Anesthesia Plan  ASA: IV  Anesthesia Plan: MAC   Post-op Pain Management:    Induction:   PONV Risk Score and Plan: 2 and Ondansetron, Midazolam, Dexamethasone and Treatment may vary due to age or medical condition  Airway Management Planned: Natural Airway, Nasal Cannula and Simple Face Mask  Additional Equipment: None  Intra-op Plan:   Post-operative Plan:   Informed Consent: I have reviewed the patients History and Physical, chart, labs and discussed the procedure including the risks, benefits and alternatives for the proposed anesthesia with the patient or authorized representative who has indicated his/her understanding and acceptance.       Plan Discussed with:   Anesthesia Plan Comments: ( Discussed patient's wishes  regarding blood transfusion, and she again states that she does not want a blood transfusion in any circumstance, even to prevent death, stroke or permanent brain damage. I specifically discussed increased risk of stroke and permanent brain damage under anesthesia with her anemia and h/o syncopal episodes. She refuses blood and albumin.)        Anesthesia Quick Evaluation

## 2018-08-24 NOTE — Transfer of Care (Signed)
Immediate Anesthesia Transfer of Care Note  Patient: Amelda Hapke  Procedure(s) Performed: ARTERIOVENOUS (AV) FISTULA CREATION (Left Arm Upper)  Patient Location: PACU  Anesthesia Type:MAC  Level of Consciousness: awake, alert  and oriented  Airway & Oxygen Therapy: Patient Spontanous Breathing and Patient connected to face mask oxygen  Post-op Assessment: Report given to RN and Post -op Vital signs reviewed and stable  Post vital signs: Reviewed and stable  Last Vitals:  Vitals Value Taken Time  BP 143/73 08/24/18 1112  Temp    Pulse 61 08/24/18 1112  Resp 16 08/24/18 1112  SpO2 95 % 08/24/18 1112  Vitals shown include unvalidated device data.  Last Pain:  Vitals:   08/24/18 0433  TempSrc: Oral  PainSc:       Patients Stated Pain Goal: 2 (16/60/60 0459)  Complications: No apparent anesthesia complications

## 2018-08-24 NOTE — Progress Notes (Signed)
Received pt post AV fistula placement. Pt alert and oriented, incision site dry intact.

## 2018-08-24 NOTE — Progress Notes (Signed)
   Patient evaluated postoperatively.  There is a thrill in her upper arm strong palpable radial pulse and her hand is sensorimotor intact.  We will set follow-up for approximately 6 weeks in our office with dialysis duplex to plan second stage procedure.  Janissa Bertram C. Donzetta Matters, MD [facility] [place] Office: [phone] Pager: [phone]

## 2018-08-24 NOTE — Progress Notes (Signed)
Patient has been accepted at OP HD clinic/[facility] on a MWF schedule with a seat time of 6:50am. Patient needs transportation and Renal Navigator has reached out to clinic Social Worker/A. Clifton James to request assistance with this for patient. Clinic Manager has requested that patient report to clinic the day prior to treatment to complete paperwork, but this may be a hardship for patient, and Renal Navigator is still discussing the best plan for patient which will be determined based on discharge date/start date. Renal Navigator updated Dr. Bartholomew Boards with patient's seat time and called patient to inform her. She states she "does not know which end is up with everything," and requests a call back with the exact day that she will start treatment. She reports that she has no family to call to assist with keeping appointments organized. She states she has no family support to assist her at home. Renal Navigator will follow for discharge date in order to follow up with patient and notify clinic of start date.  Alphonzo Cruise, [place] Renal Navigator [phone]

## 2018-08-24 NOTE — Op Note (Signed)
    Patient name: Bianca Myers MRN: [id number] DOB: [birth year] Sex: female  08/24/2018 Pre-operative Diagnosis: esrd Post-operative diagnosis:  Same Surgeon:  Erlene Quan C. Donzetta Matters, MD Assistant: Arlee Muslim, PA Procedure Performed: Left arm brachial artery to basilic vein AV fistula creation  Indications: 73 year old Jehovah's Witness with end-stage renal disease currently dialyzing via catheter.  She is indicated for left arm fistula versus graft.  Findings: There is no discernible cephalic vein above the antecubitum on the left arm.  The basilic vein was actually quite large above the antecubital measuring approximately 5 mm external diameter.  The artery was 4 mm external diameter and free of disease.  After end-to-side anastomosis there was strong thrill in the vein and a palpable radial pulse strong ulnar signal at the wrist.   Procedure:  The patient was identified in the holding area and taken to the operating room where she is placed supine operative table and MAC anesthesia induced.  She was to the prepped draped left upper extremity usual fashion antibiotics were minister and timeout called.  Ultrasound was used to evaluate for cephalic vein which was noted to be actually absent above the antecubitum.  Above the antecubital and basilic vein was quite large and did not branch right at the antecubitum.  The area was anesthetized 1% lidocaine with epinephrine.  Curvilinear incision was made above the antecubitum between the vein and the palpable brachial pulse.  I dissected down to the vein divided branches training clips and ties.  I then dissected out the brachial artery.  There appeared to be an accessory radial artery on the right.  Vesseloops placed around the brachial artery.  Basilic vein was marked for orientation transected distally and tied off.  I flushed with heparinized saline and dilated it and it does not need to be spatulated given in size.  The artery was clamped this  approximate opened longitudinally flushed with heparinized saline both directions.  The vein was then sewn inside with 6-0 Prolene suture.  Prior to the completion anastomosis we allowed flushing by directions.  On completion there is a strong thrill in the vein.  There was a palpable radial pulse there was a good signal at the ulnar artery at the wrist which was augmented with compression of the graft.  Satisfied with this we irrigated the wound obtain hemostasis and closed in layers of Vicryl Monocryl.  Dermabond placed to level the skin.  She was then awakened anesthesia having tolerated procedure well without immediate complication.  All counts were correct at completion.    EBL: 20 cc   Helen Winterhalter C. Donzetta Matters, MD [facility] [place] Office: [phone] Pager: [phone]

## 2018-08-24 NOTE — Plan of Care (Signed)
  Problem: Education: Goal: Knowledge of General Education information will improve Description Including pain rating scale, medication(s)/side effects and non-pharmacologic comfort measures Outcome: Progressing   

## 2018-08-24 NOTE — Plan of Care (Signed)

## 2018-08-24 NOTE — Progress Notes (Signed)
  Progress Note    08/24/2018 9:27 AM Day of Surgery  Subjective: No overnight issues  Vitals:   08/24/18 0433 08/24/18 0857  BP: (!) 156/82 (!) 185/82  Pulse: 66 67  Resp: 20   Temp: 98.5 F (36.9 C)   SpO2: 100%     Physical Exam: Awake alert oriented Nonlabored respirations Palpable left brachial radial pulses  CBC    Component Value Date/Time   WBC 7.9 08/24/2018 0542   RBC 2.23 (L) 08/24/2018 0542   HGB 5.9 (LL) 08/24/2018 0542   HCT 19.7 (L) 08/24/2018 0542   PLT 214 08/24/2018 0542   MCV 88.3 08/24/2018 0542   MCH 26.5 08/24/2018 0542   MCHC 29.9 (L) 08/24/2018 0542   RDW 16.8 (H) 08/24/2018 0542   LYMPHSABS 0.9 08/20/2018 1324   MONOABS 0.4 08/20/2018 1324   EOSABS 0.2 08/20/2018 1324   BASOSABS 0.0 08/20/2018 1324    BMET    Component Value Date/Time   NA 137 08/24/2018 0542   K 3.4 (L) 08/24/2018 0542   CL 102 08/24/2018 0542   CO2 25 08/24/2018 0542   GLUCOSE 104 (H) 08/24/2018 0542   BUN 37 (H) 08/24/2018 0542   CREATININE 6.82 (H) 08/24/2018 0542   CALCIUM 7.7 (L) 08/24/2018 0542   GFRNONAA 6 (L) 08/24/2018 0542   GFRAA 6 (L) 08/24/2018 0542    INR    Component Value Date/Time   INR 1.2 08/24/2018 0542     Intake/Output Summary (Last 24 hours) at 08/24/2018 8295 Last data filed at 08/24/2018 0600 Gross per 24 hour  Intake 120 ml  Output 1000 ml  Net -880 ml     Assessment/plan:  73 y.o. female is here with end-stage renal disease on dialysis via catheter.  H&H 5.9/19 patient is Jehovah's Witness does not want transfusion.  Expect minimal blood loss from primary fistula creation.  I discussed with her cephalic versus basilic vein creation and possible need for further procedures.  She demonstrates good understanding.   Zacharia Sowles C. Donzetta Matters, MD [facility] [place] Office: [phone] Pager: [phone]  08/24/2018 9:27 AM

## 2018-08-25 ENCOUNTER — Encounter (HOSPITAL_COMMUNITY): Payer: Self-pay | Admitting: Vascular Surgery

## 2018-08-25 DIAGNOSIS — D638 Anemia in other chronic diseases classified elsewhere: Secondary | ICD-10-CM

## 2018-08-25 LAB — RENAL FUNCTION PANEL
Albumin: 2.4 g/dL — ABNORMAL LOW (ref 3.5–5.0)
Anion gap: 10 (ref 5–15)
BUN: 43 mg/dL — ABNORMAL HIGH (ref 8–23)
CO2: 24 mmol/L (ref 22–32)
Calcium: 7.7 mg/dL — ABNORMAL LOW (ref 8.9–10.3)
Chloride: 102 mmol/L (ref 98–111)
Creatinine, Ser: 9.24 mg/dL — ABNORMAL HIGH (ref 0.44–1.00)
GFR calc Af Amer: 4 mL/min — ABNORMAL LOW (ref >60)
GFR calc non Af Amer: 4 mL/min — ABNORMAL LOW (ref >60)
Glucose, Bld: 103 mg/dL — ABNORMAL HIGH (ref 70–99)
Phosphorus: 4.8 mg/dL — ABNORMAL HIGH (ref 2.5–4.6)
Potassium: 3.9 mmol/L (ref 3.5–5.1)
Sodium: 136 mmol/L (ref 135–145)

## 2018-08-25 LAB — CBC
HCT: 19 % — ABNORMAL LOW (ref 36.0–46.0)
Hemoglobin: 5.7 g/dL — CL (ref 12.0–15.0)
MCH: 27.3 pg (ref 26.0–34.0)
MCHC: 30 g/dL (ref 30.0–36.0)
MCV: 90.9 fL (ref 80.0–100.0)
Platelets: 193 10*3/uL (ref 150–400)
RBC: 2.09 MIL/uL — ABNORMAL LOW (ref 3.87–5.11)
RDW: 16.8 % — ABNORMAL HIGH (ref 11.5–15.5)
WBC: 10.1 10*3/uL (ref 4.0–10.5)
nRBC: 0 % (ref 0.0–0.2)

## 2018-08-25 MED ORDER — HEPARIN SODIUM (PORCINE) 1000 UNIT/ML IJ SOLN
INTRAMUSCULAR | Status: AC
  Administered 2018-08-25: 1000 [IU]
  Filled 2018-08-25: qty 4

## 2018-08-25 NOTE — Progress Notes (Addendum)
PROGRESS NOTE                                                                                                                                                                                                             Patient Demographics:    Bianca Myers, is a 73 y.o. female, DOB - [birth year], HQP:[id number]  Admit date - 08/20/2018   Admitting Physician Merton Border, MD  Outpatient Primary MD for the patient is [name], MD  LOS - 5  Outpatient Specialists:   Chief Complaint  Patient presents with   Loss of Consciousness       Brief Narrative   73 year old female [place] with chronic kidney disease stage V presenting with syncopal episode.  Her primary nephrologist had advised that she should be started on dialysis but was refusing. In the ED she had markedly elevated troponin of 18.8, potassium 4.8 and hemoglobin of 7.  Fecal Hemoccult was negative.  Patient started on hemodialysis this admission.   Subjective:   Patient seen and examined after returning from dialysis.  Reported pain in her left hand and arm where she had AV fistula created yesterday.   Assessment  & Plan :   Principal problems:   CKD (chronic kidney disease) requiring chronic dialysis ([place]) Has been CLiped for M, W, F dialysis in [place].  Awaiting transportation for HD arranged. Left-sided aVF placement 6/23, currently getting dialysis through permacath. Renal following.  Continue phosphate binder.  Anemia of chronic disease Hemoglobin of 5.7 today.  Patient is Jehov[employer]'s Witness and strictly refuses any blood products.  continue Aranesp with dialysis and IV iron.  Avoid further blood draws.  Syncope Suspect due to intravascular depletion.  Now resolved.  Metabolic acidosis Corrected with dialysis.  Chronic depression Cymbalta discontinued since patient has end-stage renal disease.  Can be placed on suitable  alternative by her primary as outpatient.   Code Status : Full code  Family Communication  : None at bedside  Disposition Plan  : Home once hemodialysis transportation arranged  Barriers For Discharge : Transportation issues  Consults  : Nephrology/vascular  Procedures  : Left AV fistula on 6/23  DVT Prophylaxis  : SCDs  Lab Results  Component Value Date   PLT 193 08/25/2018    Antibiotics  :    Anti-infectives (From admission, onward)  Start     Dose/Rate Route Frequency Ordered Stop   08/24/18 0700  ceFAZolin (ANCEF) IVPB 1 g/50 mL premix  Status:  Discontinued    Note to Pharmacy: Send with pt to OR   1 g 100 mL/hr over 30 Minutes Intravenous On call 08/23/18 0735 08/23/18 1527   08/24/18 0700  ceFAZolin (ANCEF) IVPB 2g/100 mL premix    Note to Pharmacy: Send with pt to OR   2 g 200 mL/hr over 30 Minutes Intravenous On call 08/23/18 1527 08/24/18 1009   08/22/18 1100  fluconazole (DIFLUCAN) tablet 150 mg     150 mg Oral  Once 08/22/18 1050 08/22/18 1304   08/21/18 1300  ceFAZolin (ANCEF) IVPB 2g/100 mL premix     2 g 200 mL/hr over 30 Minutes Intravenous  Once 08/21/18 1232 08/21/18 1255        Objective:   Vitals:   08/25/18 0930 08/25/18 1000 08/25/18 1025 08/25/18 1136  BP: (!) 151/79 (!) 158/81 (!) 171/81 (!) 155/81  Pulse: 78 80 74 77  Resp:   17 18  Temp:   98.4 F (36.9 C) 97.9 F (36.6 C)  TempSrc:   Oral Oral  SpO2:   99% 100%  Weight:      Height:        Wt Readings from Last 3 Encounters:  08/25/18 79.3 kg  12/10/15 95.7 kg     Intake/Output Summary (Last 24 hours) at 08/25/2018 1451 Last data filed at 08/25/2018 1200 Gross per 24 hour  Intake 300 ml  Output 1000 ml  Net -700 ml     Physical Exam  Gen: not in distress HEENT: Pallor present moist mucosa, supple neck Chest: clear b/l, no added sounds, right-sided permacath CVS: N S1&S2, no murmurs,  GI: soft, NT, ND,  Musculoskeletal: Left AV fistula site appears  clean,     Data Review:    CBC Recent Labs  Lab 08/20/18 1324 08/22/18 0104 08/23/18 0528 08/24/18 0542 08/25/18 0755  WBC 7.0 7.9 7.2 7.9 10.1  HGB 7.0* 6.3* 6.0* 5.9* 5.7*  HCT 22.8* 20.0* 19.5* 19.7* 19.0*  PLT 350 294 243 214 193  MCV 88.0 87.0 88.2 88.3 90.9  MCH 27.0 27.4 27.1 26.5 27.3  MCHC 30.7 31.5 30.8 29.9* 30.0  RDW 17.5* 17.2* 17.2* 16.8* 16.8*  LYMPHSABS 0.9  --   --   --   --   MONOABS 0.4  --   --   --   --   EOSABS 0.2  --   --   --   --   BASOSABS 0.0  --   --   --   --     Chemistries  Recent Labs  Lab 08/20/18 1324 08/21/18 0113 08/23/18 0528 08/24/18 0542 08/25/18 0755  NA 134* 136 136 137 136  K 4.8 4.3 3.9 3.4* 3.9  CL 98 105 104 102 102  CO2 18* 17* 22 25 24   GLUCOSE 165* 129* 90 104* 103*  BUN 138* 136* 81* 37* 43*  CREATININE 18.80* 17.79* 12.15* 6.82* 9.24*  CALCIUM 9.2 8.3* 7.9* 7.7* 7.7*  AST 9*  --   --   --   --   ALT 10  --   --   --   --   ALKPHOS 47  --   --   --   --   BILITOT 0.8  --   --   --   --    ------------------------------------------------------------------------------------------------------------------ No results for input(s):  CHOL, HDL, LDLCALC, TRIG, CHOLHDL, LDLDIRECT in the last 72 hours.  No results found for: HGBA1C ------------------------------------------------------------------------------------------------------------------ No results for input(s): TSH, T4TOTAL, T3FREE, THYROIDAB in the last 72 hours.  Invalid input(s): FREET3 ------------------------------------------------------------------------------------------------------------------ No results for input(s): VITAMINB12, FOLATE, FERRITIN, TIBC, IRON, RETICCTPCT in the last 72 hours.  Coagulation profile Recent Labs  Lab 08/21/18 1145 08/24/18 0542  INR 1.1 1.2    No results for input(s): DDIMER in the last 72 hours.  Cardiac Enzymes Recent Labs  Lab 08/20/18 1910 08/21/18 0113 08/21/18 0827  TROPONINI 0.03* 0.04* 0.04*    ------------------------------------------------------------------------------------------------------------------ No results found for: BNP  Inpatient Medications  Scheduled Meds:  sodium chloride   Intravenous Once   atorvastatin  10 mg Oral QHS   Chlorhexidine Gluconate Cloth  6 each Topical Q0600   Chlorhexidine Gluconate Cloth  6 each Topical Q0600   [START ON 08/28/2018] darbepoetin (ARANESP) injection - DIALYSIS  60 mcg Intravenous Q Sat-HD   feeding supplement (PRO-STAT SUGAR FREE 64)  30 mL Oral BID   labetalol  200 mg Oral BID   multivitamin  1 tablet Oral QHS   NIFEdipine  30 mg Oral QHS   sevelamer carbonate  800 mg Oral TID WC   Continuous Infusions:  sodium chloride 10 mL/hr at 08/24/18 0849   PRN Meds:.acetaminophen, HYDROcodone-acetaminophen, ondansetron **OR** ondansetron (ZOFRAN) IV  Micro Results Recent Results (from the past 240 hour(s))  SARS Coronavirus 2     Status: None   Collection Time: 08/20/18  3:11 PM  Result Value Ref Range Status   SARS Coronavirus 2 NOT DETECTED NOT DETECTED Final    Comment: (NOTE) SARS-CoV-2 target nucleic acids are NOT DETECTED. The SARS-CoV-2 RNA is generally detectable in upper and lower respiratory specimens during the acute phase of infection.  Negative  results do not preclude SARS-CoV-2 infection, do not rule out co-infections with other pathogens, and should not be used as the sole basis for treatment or other patient management decisions.  Negative results must be combined with clinical observations, patient history, and epidemiological information. The expected result is Not Detected. Fact Sheet for Patients: http://www.biofiredefense.com/wp-content/uploads/2020/03/BIOFIRE-COVID -19-patients.pdf Fact Sheet for Healthcare Providers: http://www.biofiredefense.com/wp-content/uploads/2020/03/BIOFIRE-COVID -19-hcp.pdf This test is not yet approved or cleared by the [place] and  has been  authorized for detection and/or diagnosis of SARS-CoV-2 by FDA under an Emergency Use Authorization (EUA).  This EUA will remain in effec t (meaning this test can be used) for the duration of  the COVID-19 declaration under Section 564(b)(1) of the Act, 21 U.S.C. section 360bbb-3(b)(1), unless the authorization is terminated or revoked sooner. Performed at [facility], [place] [address] [place], [place] [address]     Radiology Reports Dg Chest 2 View  Result Date: 08/20/2018 CLINICAL DATA:  Syncope EXAM: CHEST - 2 VIEW COMPARISON:  10/31/2013 FINDINGS: Scarring and fibrosis within the right greater than left upper lobe as before. No acute consolidation or effusion. Stable cardiomediastinal silhouette with unfolded aorta. No pneumothorax. Degenerative changes of the spine. IMPRESSION: No active cardiopulmonary disease. Stable right greater than left upper lobe scarring and fibrosis. Electronically Signed   By: Donavan Foil M.D.   On: 08/20/2018 14:49   Ct Head Wo Contrast  Result Date: 08/20/2018 CLINICAL DATA:  Syncopal episode. EXAM: CT HEAD WITHOUT CONTRAST TECHNIQUE: Contiguous axial images were obtained from the base of the skull through the vertex without intravenous contrast. COMPARISON:  None. FINDINGS: Brain: The ventricles are in the midline without mass effect or shift. No  extra-axial fluid collections are identified. Age related cerebral atrophy, ventriculomegaly and periventricular white matter disease. Remote appearing lacunar type right basal ganglia infarcts are noted. No CT findings for acute hemispheric infarction or intracranial hemorrhage. No mass lesions. The brainstem and cerebellum are unremarkable. 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 Vascular: Vascular calcifications but no definite aneurysm or hyperdense vessels. Skull: No skull fracture or bone lesions. Sinuses/Orbits: The paranasal sinuses and mastoid air cells are clear. The globes are intact. Other: No scalp lesions  or hematoma. IMPRESSION: 1. No CT findings for an acute intracranial process. 2. 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 3. Small midline lipomas. 4. Age related cerebral atrophy, ventriculomegaly and periventricular white matter disease. Electronically Signed   By: Marijo Sanes M.D.   On: 08/20/2018 14:22   Ir Fluoro Guide Cv Line Right  Result Date: 08/21/2018 INDICATION: 73 year old with end-stage renal disease and starting hemodialysis. EXAM: FLUOROSCOPIC AND ULTRASOUND GUIDED PLACEMENT OF A TUNNELED DIALYSIS CATHETER Physician: Stephan Minister. Anselm Pancoast, MD MEDICATIONS: Ancef 2 g; The antibiotic was administered within an appropriate time interval prior to skin puncture. ANESTHESIA/SEDATION: Versed 2.0 mg IV; Fentanyl 100 mcg IV; Moderate Sedation Time:  20 minutes The patient was continuously monitored during the procedure by the interventional radiology nurse under my direct supervision. FLUOROSCOPY TIME:  Fluoroscopy Time: 36 seconds, 2 mGy COMPLICATIONS: None immediate. PROCEDURE: The procedure was explained to the patient. The risks and benefits of the procedure were discussed and the patient's questions were addressed. Informed consent was obtained from the patient. The patient was placed supine on the interventional table. Ultrasound confirmed a patent right internal jugular vein. Ultrasound images were obtained for documentation. The right neck and chest was prepped and draped in a sterile fashion. The right neck was anesthetized with 1% lidocaine. Maximal barrier sterile technique was utilized including caps, mask, sterile gowns, sterile gloves, sterile drape, hand hygiene and skin antiseptic. A small incision was made with #11 blade scalpel. A 21 gauge needle directed into the right internal jugular vein with ultrasound guidance. A micropuncture dilator set was placed. A 19 cm tip to cuff Palindrome catheter was selected. The skin below the right clavicle was anesthetized  and a small incision was made with an #11 blade scalpel. A subcutaneous tunnel was formed to the vein dermatotomy site. The catheter was brought through the tunnel. The vein dermatotomy site was dilated to accommodate a peel-away sheath. The catheter was placed through the peel-away sheath and directed into the central venous structures. The tip of the catheter was placed at the SVC and right atrium junction with fluoroscopy. Fluoroscopic images were obtained for documentation. Both lumens were found to aspirate and flush well. The proper amount of heparin was flushed in both lumens. The vein dermatotomy site was closed using a single layer of absorbable suture and Dermabond. Gel-Foam was placed in the subcutaneous tract. The catheter was secured to the skin using Prolene suture. IMPRESSION: Successful placement of a right jugular tunneled dialysis catheter using ultrasound and fluoroscopic guidance. Electronically Signed   By: Markus Daft M.D.   On: 08/21/2018 16:14   Ir US Guide Vasc Access Right  Result Date: 08/21/2018 INDICATION: 73 year old with end-stage renal disease and starting hemodialysis. EXAM: FLUOROSCOPIC AND ULTRASOUND GUIDED PLACEMENT OF A TUNNELED DIALYSIS CATHETER Physician: Stephan Minister. Anselm Pancoast, MD MEDICATIONS: Ancef 2 g; The antibiotic was administered within an appropriate time interval prior to skin puncture. ANESTHESIA/SEDATION: Versed 2.0 mg IV; Fentanyl 100 mcg IV; Moderate Sedation  Time:  20 minutes The patient was continuously monitored during the procedure by the interventional radiology nurse under my direct supervision. FLUOROSCOPY TIME:  Fluoroscopy Time: 36 seconds, 2 mGy COMPLICATIONS: None immediate. PROCEDURE: The procedure was explained to the patient. The risks and benefits of the procedure were discussed and the patient's questions were addressed. Informed consent was obtained from the patient. The patient was placed supine on the interventional table. Ultrasound confirmed a patent  right internal jugular vein. Ultrasound images were obtained for documentation. The right neck and chest was prepped and draped in a sterile fashion. The right neck was anesthetized with 1% lidocaine. Maximal barrier sterile technique was utilized including caps, mask, sterile gowns, sterile gloves, sterile drape, hand hygiene and skin antiseptic. A small incision was made with #11 blade scalpel. A 21 gauge needle directed into the right internal jugular vein with ultrasound guidance. A micropuncture dilator set was placed. A 19 cm tip to cuff Palindrome catheter was selected. The skin below the right clavicle was anesthetized and a small incision was made with an #11 blade scalpel. A subcutaneous tunnel was formed to the vein dermatotomy site. The catheter was brought through the tunnel. The vein dermatotomy site was dilated to accommodate a peel-away sheath. The catheter was placed through the peel-away sheath and directed into the central venous structures. The tip of the catheter was placed at the SVC and right atrium junction with fluoroscopy. Fluoroscopic images were obtained for documentation. Both lumens were found to aspirate and flush well. The proper amount of heparin was flushed in both lumens. The vein dermatotomy site was closed using a single layer of absorbable suture and Dermabond. Gel-Foam was placed in the subcutaneous tract. The catheter was secured to the skin using Prolene suture. IMPRESSION: Successful placement of a right jugular tunneled dialysis catheter using ultrasound and fluoroscopic guidance. Electronically Signed   By: Markus Daft M.D.   On: 08/21/2018 16:14   Vas [place] Upper Ext Vein Mapping (pre-op Avf)  Result Date: 08/22/2018 UPPER EXTREMITY VEIN MAPPING  Indications: Pre-Dialysis access. Limitations: IV/bandages Comparison Study: No prior study on file for comparison Performing Technologist: Sharion Dove RVS  Examination Guidelines: A complete evaluation includes B-mode  imaging, spectral Doppler, color Doppler, and power Doppler as needed of all accessible portions of each vessel. Bilateral testing is considered an integral part of a complete examination. Limited examinations for reoccurring indications may be performed as noted. +-----------------+-------------+----------+---------+  Right Cephalic    Diameter (cm) Depth (cm) Findings   +-----------------+-------------+----------+---------+  Prox upper arm        0.46         0.61               +-----------------+-------------+----------+---------+  Mid upper arm         0.45         0.33               +-----------------+-------------+----------+---------+  Dist upper arm        0.43         0.29               +-----------------+-------------+----------+---------+  Antecubital fossa     0.63         0.14               +-----------------+-------------+----------+---------+  Prox forearm          0.39         0.24    branching  +-----------------+-------------+----------+---------+  Mid forearm           0.41         0.24    branching  +-----------------+-------------+----------+---------+  Wrist                 0.31         0.20               +-----------------+-------------+----------+---------+ +-----------------+-------------+----------+--------------+  Left Cephalic     Diameter (cm) Depth (cm)    Findings     +-----------------+-------------+----------+--------------+  Shoulder                                   not visualized  +-----------------+-------------+----------+--------------+  Prox upper arm                             not visualized  +-----------------+-------------+----------+--------------+  Mid upper arm                              not visualized  +-----------------+-------------+----------+--------------+  Dist upper arm        0.41         1.53                    +-----------------+-------------+----------+--------------+  Antecubital fossa     0.36         3.44                     +-----------------+-------------+----------+--------------+  Prox forearm                               not visualized  +-----------------+-------------+----------+--------------+  Mid forearm                                not visualized  +-----------------+-------------+----------+--------------+  Wrist                                      not visualized  +-----------------+-------------+----------+--------------+ +-----------------+-------------+----------+---------+  Left Basilic      Diameter (cm) Depth (cm) Findings   +-----------------+-------------+----------+---------+  Prox upper arm        0.44         1.55               +-----------------+-------------+----------+---------+  Mid upper arm         0.49         1.01               +-----------------+-------------+----------+---------+  Dist upper arm        0.49         0.89    branching  +-----------------+-------------+----------+---------+  Antecubital fossa     0.44         0.90               +-----------------+-------------+----------+---------+  Prox forearm          0.46         1.19    branching  +-----------------+-------------+----------+---------+  Mid forearm           0.42  1.46               +-----------------+-------------+----------+---------+  Wrist                 0.18         0.13    branching  +-----------------+-------------+----------+---------+ *See table(s) above for measurements and observations.  Diagnosing physician: Monica Martinez MD Electronically signed by Monica Martinez MD on 08/22/2018 at 3:10:20 PM.    Final     Time Spent in minutes  25   Gerrick Ray M.D on 08/25/2018 at 2:51 PM  Between 7am to 7pm - Pager - [phone]  After 7pm go to [email or web address] - password [facility]  [facility] -  Office  [phone]

## 2018-08-25 NOTE — Progress Notes (Signed)
Renal Navigator sent message to OP HD clinic/[place] Social Worker/A. Venable to follow up on transportation possibilities for patient. Renal Navigator will continue to follow closely in order to make plans for OP HD treatment for patient.  Alphonzo Cruise, [place] Renal Navigator [phone]

## 2018-08-25 NOTE — Plan of Care (Signed)
Patient completed dialysis today, 1L removed.  Complaints of pain in Left upper arm from fistula site.  Patient able to ambulate to bathroom independently

## 2018-08-25 NOTE — Plan of Care (Signed)
  Problem: Education: Goal: Knowledge of General Education information will improve Description Including pain rating scale, medication(s)/side effects and non-pharmacologic comfort measures Outcome: Progressing   Problem: Health Behavior/Discharge Planning: Goal: Ability to manage health-related needs will improve Outcome: Progressing   

## 2018-08-25 NOTE — Progress Notes (Signed)
[facility]    NEPHROLOGY PROGRESS NOTE  SUBJECTIVE:  Seen on HD this AM at 8:48 am with BP 146/75 and HR 67 on HD via Right chest tunneled catheter.  She has an outpatient unit MWF but doesn't yet have transportation - hasn't heard any updates.  She ended up having labs this AM.  Review of systems:  Shortness of breath on exertion No n/v Post-op discomfort - still sore   OBJECTIVE:  Vitals:   08/25/18 0725 08/25/18 0730  BP: 132/72 (!) 142/72  Pulse: 66 67  Resp: 18 17  Temp:    SpO2:      Intake/Output Summary (Last 24 hours) at 08/25/2018 0849 Last data filed at 08/25/2018 0600 Gross per 24 hour  Intake 740 ml  Output 25 ml  Net 715 ml      General:  AAOx3 NAD  HEENT: MMM [place] AT anicteric sclera CV:  Heart RRR  Lungs: clear and unlabored  Abd:  abd SNT/ND Extremities:  No LE edema. Skin:  No skin rash Access: right chest tunneled catheter; LUE AVF with bruit and thrill   MEDICATIONS:  . sodium chloride   Intravenous Once  . atorvastatin  10 mg Oral QHS  . Chlorhexidine Gluconate Cloth  6 each Topical Q0600  . Chlorhexidine Gluconate Cloth  6 each Topical Q0600  . [START ON 08/28/2018] darbepoetin (ARANESP) injection - DIALYSIS  60 mcg Intravenous Q Sat-HD  . feeding supplement (PRO-STAT SUGAR FREE 64)  30 mL Oral BID  . labetalol  200 mg Oral BID  . multivitamin  1 tablet Oral QHS  . NIFEdipine  30 mg Oral QHS  . sevelamer carbonate  800 mg Oral TID WC     LABS:   CBC Latest Ref Rng & Units 08/25/2018 08/24/2018 08/23/2018  WBC 4.0 - 10.5 K/uL 10.1 7.9 7.2  Hemoglobin 12.0 - 15.0 g/dL 5.7(LL) 5.9(LL) 6.0(LL)  Hematocrit 36.0 - 46.0 % 19.0(L) 19.7(L) 19.5(L)  Platelets 150 - 400 K/uL 193 214 243    CMP Latest Ref Rng & Units 08/25/2018 08/24/2018 08/23/2018  Glucose 70 - 99 mg/dL 103(H) 104(H) 90  BUN 8 - 23 mg/dL 43(H) 37(H) 81(H)  Creatinine 0.44 - 1.00 mg/dL 9.24(H) 6.82(H) 12.15(H)  Sodium 135 - 145 mmol/L 136 137 136  Potassium 3.5 -  5.1 mmol/L 3.9 3.4(L) 3.9  Chloride 98 - 111 mmol/L 102 102 104  CO2 22 - 32 mmol/L 24 25 22   Calcium 8.9 - 10.3 mg/dL 7.7(L) 7.7(L) 7.9(L)  Total Protein 6.5 - 8.1 g/dL - - -  Total Bilirubin 0.3 - 1.2 mg/dL - - -  Alkaline Phos 38 - 126 U/L - - -  AST 15 - 41 U/L - - -  ALT 0 - 44 U/L - - -    Lab Results  Component Value Date   PTH 16 08/21/2018   CALCIUM 7.7 (L) 08/25/2018   PHOS 4.8 (H) 08/25/2018       Component Value Date/Time   COLORURINE YELLOW 08/20/2018 2040   APPEARANCEUR HAZY (A) 08/20/2018 2040   LABSPEC 1.012 08/20/2018 2040   PHURINE 5.0 08/20/2018 2040   GLUCOSEU NEGATIVE 08/20/2018 2040   HGBUR SMALL (A) 08/20/2018 2040   [place] NEGATIVE 08/20/2018 2040   [place] NEGATIVE 08/20/2018 2040   PROTEINUR 100 (A) 08/20/2018 2040   NITRITE NEGATIVE 08/20/2018 2040   LEUKOCYTESUR LARGE (A) 08/20/2018 2040   No results found for: PHART, PCO2ART, PO2ART, HCO3, TCO2, ACIDBASEDEF, O2SAT  Component Value Date/Time   IRON 39 08/21/2018 0113   TIBC 204 (L) 08/21/2018 0113   FERRITIN 349 (H) 08/21/2018 0113   IRONPCTSAT 19 08/21/2018 0113       ASSESSMENT/PLAN:      The patient is a 73 y.o. year old female patient with a past medical history significant for stage V chronic kidney disease who presented from her appointment with Dr. Justin Mend after 2 episodes of syncope in the lobby.  She reports feeling weak for quite some time.  In the emergency department, she was noted to have a creatinine of 18.8.     1.    End-stage renal disease.  - Left arm AVF placed today.  Note plans for vascular follow-up for 6 weeks for dialysis duplex to plan second stage procedure.  - HD today per MWF schedule.  She doesn't yet have transportation for dialysis but has a spot [place] MWF 1st shift at 6:50 am.  - Would choose alternate to cymbalta given her ESRD (would not resume on discharge)  2.  Anemia.  Profound.  Hemoccult was negative.  Likely secondary to chronic kidney  disease.    Will continue Aranesp and IV iron for low iron sat.  Is refusing blood transfusions due to religious preference.  - Please minimize lab draws - does not need daily electrolyte check.  No labs needed from renal standpoint on 6/25. - Her hemoglobin is not currently stable for discharge for outpatient HD. Repeat CBC on 6/26 (retimed).  3.  Syncope.  Suspect hemodynamic in nature.  Now stable   4.  Hypertension.  Blood pressure currently is stable.    5.  Metabolic acidosis.  on HD  6.  Metabolic bone disease.  PTH low at 16.  Phos improving with HD.  May be able to stop binder now that established on HD.   [name] 08/25/2018 8:49 AM

## 2018-08-25 NOTE — Progress Notes (Signed)
[place] clinic Social Worker has arranged [facility] transportation for patient with the earliest initiation of services for Monday 08/30/18. However, given that patient's hemoglobin was 5.9 yesterday, clinic staff has concerns about admitting patient with hemoglobin this low and will discuss with clinic Medical Director tomorrow before ultimate decision is made regarding acceptance to the OP HD clinic. Renal Navigator will continue to follow.  Alphonzo Cruise, [place] Renal Navigator [phone]

## 2018-08-25 NOTE — Anesthesia Postprocedure Evaluation (Signed)
Anesthesia Post Note  Patient: Bianca Myers  Procedure(s) Performed: ARTERIOVENOUS (AV) FISTULA CREATION (Left Arm Upper)     Patient location during evaluation: PACU Anesthesia Type: MAC Level of consciousness: awake and alert Pain management: pain level controlled Vital Signs Assessment: post-procedure vital signs reviewed and stable Respiratory status: spontaneous breathing, nonlabored ventilation and respiratory function stable Cardiovascular status: blood pressure returned to baseline and stable Postop Assessment: no apparent nausea or vomiting Anesthetic complications: no    Last Vitals:  Vitals:   08/25/18 1025 08/25/18 1136  BP: (!) 171/81 (!) 155/81  Pulse: 74 77  Resp: 17 18  Temp: 36.9 C 36.6 C  SpO2: 99% 100%    Last Pain:  Vitals:   08/25/18 1226  TempSrc:   PainSc: 6                  Lidia Collum

## 2018-08-26 LAB — MRSA PCR SCREENING: MRSA by PCR: NEGATIVE

## 2018-08-26 MED ORDER — DARBEPOETIN ALFA 60 MCG/0.3ML IJ SOSY
60.0000 ug | PREFILLED_SYRINGE | Freq: Once | INTRAMUSCULAR | Status: AC
  Administered 2018-08-27: 09:00:00 60 ug via INTRAVENOUS
  Filled 2018-08-26: qty 0.3

## 2018-08-26 MED ORDER — SODIUM CHLORIDE 0.9 % IV SOLN
510.0000 mg | Freq: Once | INTRAVENOUS | Status: AC
  Administered 2018-08-26: 510 mg via INTRAVENOUS
  Filled 2018-08-26: qty 17

## 2018-08-26 MED ORDER — CHLORHEXIDINE GLUCONATE CLOTH 2 % EX PADS
6.0000 | MEDICATED_PAD | Freq: Every day | CUTANEOUS | Status: DC
  Administered 2018-08-27: 6 via TOPICAL

## 2018-08-26 NOTE — Progress Notes (Signed)
Renal Navigator received notification from OP HD clinic/ that [facility] wants patient to be ready for pick up at her home at 6:15am. Renal Navigator contacted patient to inform her of this and she replied, "I'm going to have to call you back because I'm about to go off on you." Renal Navigator agreed that she could call back at a time better for her. Clinic and [facility] transportation is prepared for patient to start in OP HD clinic on Monday, 08/30/18.  Alphonzo Cruise, [place] Renal Navigator [phone]

## 2018-08-26 NOTE — Care Management Important Message (Signed)
Important Message  Patient Details  Name: Bianca Myers MRN: [id number] Date of Birth: 16-Sep-1945   Medicare Important Message Given:  Yes     [name] 08/26/2018, 1:19 PM

## 2018-08-26 NOTE — Progress Notes (Signed)
Nutrition Follow-up  RD working remotely.  DOCUMENTATION CODES:   Not applicable  INTERVENTION:   -D/c Prostat, due to poor acceptance -Continue renal MVI daily -Provided "Nutritional Guidelines for Adults Starting Hemodialysis" handout from [facility]; attached to AVS/discharge summary  NUTRITION DIAGNOSIS:   Increased nutrient needs related to chronic illness(ESRD on HD) as evidenced by estimated needs.  Ongoing  GOAL:   Patient will meet greater than or equal to 90% of their needs  Progressing   MONITOR:   PO intake, Supplement acceptance, Labs, Weight trends, Skin, I & O's  REASON FOR ASSESSMENT:   Malnutrition Screening Tool    ASSESSMENT:   Blakeley Scheier  is a 73 y.o. female, with past medical history significant for chronic kidney disease stage V who is presenting to the emergency department for a syncopal episode earlier today.  That the patient was on her way to her nephrologist office when this happened.  After further questioning the patient reports similar episodes for the last 2 days and she had another episode in the lobby today.  Patient reports 1 year history of feeling weak and has history for chronic kidney disease stage V.  The patient was advised to start dialysis by her primary nephrologist but refused.  Patient denies any preceding chest pains palpitations, shortness of breath, nausea vomiting or diarrhea.  6/20- s/p temporary HD cath placement with IR 6/23- s/p  Left arm brachial artery to basilic vein AV fistula creation  Reviewed I/O's: -580 ml x 24 hours and +3 ml since admission  Pt with good appetite. Noted meal completion 100%. Pt is refusing Prostat, as she reports it gives her diarrhea. Will d/c due to poor acceptance.   Pt is medically stable for discharge and outpatient HD has been set up. Pt awaiting HD transportation arrangements.   Labs reviewed: CBGS: 123, Phos: 4.8. K WDL.   Diet Order:   Diet Order             Diet renal with fluid restriction Fluid restriction: 1200 mL Fluid; Room service appropriate? Yes; Fluid consistency: Thin  Diet effective now              EDUCATION NEEDS:   No education needs have been identified at this time  Skin:  Skin Assessment: Skin Integrity Issues: Skin Integrity Issues:: Incisions Incisions: closed lt arm incision (HD fistula)  Last BM:  08/25/18  Height:   Ht Readings from Last 1 Encounters:  08/22/18 5' 6.5" (1.689 m)    Weight:   Wt Readings from Last 1 Encounters:  08/26/18 80.3 kg    Ideal Body Weight:  61.4 kg  BMI:  Body mass index is 28.14 kg/m.  Estimated Nutritional Needs:   Kcal:  1850-2050  Protein:  80-95 grams  Fluid:  1000 ml + UOP    Ibrohim Simmers A. Jimmye Norman, RD, LDN, [place] Registered Dietitian II Certified Diabetes Care and Education Specialist Pager: [phone] After hours Pager: [phone]

## 2018-08-26 NOTE — Progress Notes (Signed)
PROGRESS NOTE                                                                                                                                                                                                             Patient Demographics:    Bianca Myers, is a 73 y.o. female, DOB - January 03, 1946, ASN:[id number]  Admit date - 08/20/2018   Admitting Physician Merton Border, MD  Outpatient Primary MD for the patient is [name], MD  LOS - 6  Outpatient Specialists:   Chief Complaint  Patient presents with   Loss of Consciousness       Brief Narrative   73 year old female [place] with chronic kidney disease stage V presenting with syncopal episode.  Her primary nephrologist had advised that she should be started on dialysis but was refusing. In the ED she had markedly elevated troponin of 18.8, potassium 4.8 and hemoglobin of 7.  Fecal Hemoccult was negative.  Patient started on hemodialysis this admission.   Subjective:   Patient complains of off-and-on pain in both times now.  No other symptoms.   Assessment  & Plan :   Principal problems:   CKD (chronic kidney disease) requiring chronic dialysis ([place]) Has been CLiped for M, W, F dialysis in [place].  Plan to be discharged home after getting dialyzed here tomorrow. Left-sided aVF placement 6/23, currently getting dialysis through permacath. Renal following.  Continue phosphate binder.  Anemia of chronic disease, severe Hemoglobin of 5.7.  Getting IV iron again today.  Patient is Jehov[employer]'s Witness and strictly refuses any blood products.  continue Aranesp with dialysis.  No further blood draws planned. Syncope Suspect due to intravascular depletion.  Now resolved.  Metabolic acidosis Corrected with dialysis.  Chronic depression Cymbalta discontinued since patient has end-stage renal disease.  Can be placed on suitable alternative by her  primary as outpatient.   Code Status : Full code  Family Communication  : None at bedside  Disposition Plan  : Home once hemodialysis transportation arranged  Barriers For Discharge : Transportation issues  Consults  : Nephrology/vascular  Procedures  : Left AV fistula on 6/23  DVT Prophylaxis  : SCDs  Lab Results  Component Value Date   PLT 193 08/25/2018    Antibiotics  :    Anti-infectives (From admission, onward)   Start  Dose/Rate Route Frequency Ordered Stop   08/24/18 0700  ceFAZolin (ANCEF) IVPB 1 g/50 mL premix  Status:  Discontinued    Note to Pharmacy: Send with pt to OR   1 g 100 mL/hr over 30 Minutes Intravenous On call 08/23/18 0735 08/23/18 1527   08/24/18 0700  ceFAZolin (ANCEF) IVPB 2g/100 mL premix    Note to Pharmacy: Send with pt to OR   2 g 200 mL/hr over 30 Minutes Intravenous On call 08/23/18 1527 08/24/18 1009   08/22/18 1100  fluconazole (DIFLUCAN) tablet 150 mg     150 mg Oral  Once 08/22/18 1050 08/22/18 1304   08/21/18 1300  ceFAZolin (ANCEF) IVPB 2g/100 mL premix     2 g 200 mL/hr over 30 Minutes Intravenous  Once 08/21/18 1232 08/21/18 1255        Objective:   Vitals:   08/25/18 2151 08/26/18 0426 08/26/18 0545 08/26/18 0852  BP: (!) 123/53 136/68  128/64  Pulse: 71 71  80  Resp:      Temp:  99.4 F (37.4 C)    TempSrc:  Oral    SpO2:  99%    Weight:   80.3 kg   Height:        Wt Readings from Last 3 Encounters:  08/26/18 80.3 kg  12/10/15 95.7 kg     Intake/Output Summary (Last 24 hours) at 08/26/2018 1130 Last data filed at 08/26/2018 0807 Gross per 24 hour  Intake 540 ml  Output --  Net 540 ml    Physical exam Not in distress HEENT: Pallor present, moist mucosa  Chest: Clear bilaterally, right-sided permacath CVs: Normal S1-S2 GI: Soft, nondistended, nontender Musculoskeletal: Left AV fistula site is clean no palpable tenderness       Data Review:    CBC Recent Labs  Lab 08/20/18 1324  08/22/18 0104 08/23/18 0528 08/24/18 0542 08/25/18 0755  WBC 7.0 7.9 7.2 7.9 10.1  HGB 7.0* 6.3* 6.0* 5.9* 5.7*  HCT 22.8* 20.0* 19.5* 19.7* 19.0*  PLT 350 294 243 214 193  MCV 88.0 87.0 88.2 88.3 90.9  MCH 27.0 27.4 27.1 26.5 27.3  MCHC 30.7 31.5 30.8 29.9* 30.0  RDW 17.5* 17.2* 17.2* 16.8* 16.8*  LYMPHSABS 0.9  --   --   --   --   MONOABS 0.4  --   --   --   --   EOSABS 0.2  --   --   --   --   BASOSABS 0.0  --   --   --   --     Chemistries  Recent Labs  Lab 08/20/18 1324 08/21/18 0113 08/23/18 0528 08/24/18 0542 08/25/18 0755  NA 134* 136 136 137 136  K 4.8 4.3 3.9 3.4* 3.9  CL 98 105 104 102 102  CO2 18* 17* 22 25 24   GLUCOSE 165* 129* 90 104* 103*  BUN 138* 136* 81* 37* 43*  CREATININE 18.80* 17.79* 12.15* 6.82* 9.24*  CALCIUM 9.2 8.3* 7.9* 7.7* 7.7*  AST 9*  --   --   --   --   ALT 10  --   --   --   --   ALKPHOS 47  --   --   --   --   BILITOT 0.8  --   --   --   --    ------------------------------------------------------------------------------------------------------------------ No results for input(s): CHOL, HDL, LDLCALC, TRIG, CHOLHDL, LDLDIRECT in the last 72 hours.  No results found for: HGBA1C ------------------------------------------------------------------------------------------------------------------  No results for input(s): TSH, T4TOTAL, T3FREE, THYROIDAB in the last 72 hours.  Invalid input(s): FREET3 ------------------------------------------------------------------------------------------------------------------ No results for input(s): VITAMINB12, FOLATE, FERRITIN, TIBC, IRON, RETICCTPCT in the last 72 hours.  Coagulation profile Recent Labs  Lab 08/21/18 1145 08/24/18 0542  INR 1.1 1.2    No results for input(s): DDIMER in the last 72 hours.  Cardiac Enzymes Recent Labs  Lab 08/20/18 1910 08/21/18 0113 08/21/18 0827  TROPONINI 0.03* 0.04* 0.04*    ------------------------------------------------------------------------------------------------------------------ No results found for: BNP  Inpatient Medications  Scheduled Meds:  atorvastatin  10 mg Oral QHS   Chlorhexidine Gluconate Cloth  6 each Topical Q0600   Chlorhexidine Gluconate Cloth  6 each Topical Q0600   [START ON 08/27/2018] darbepoetin (ARANESP) injection - DIALYSIS  60 mcg Intravenous Once   feeding supplement (PRO-STAT SUGAR FREE 64)  30 mL Oral BID   labetalol  200 mg Oral BID   multivitamin  1 tablet Oral QHS   NIFEdipine  30 mg Oral QHS   Continuous Infusions:  ferumoxytol     PRN Meds:.acetaminophen, HYDROcodone-acetaminophen, ondansetron **OR** ondansetron (ZOFRAN) IV  Micro Results Recent Results (from the past 240 hour(s))  SARS Coronavirus 2     Status: None   Collection Time: 08/20/18  3:11 PM  Result Value Ref Range Status   SARS Coronavirus 2 NOT DETECTED NOT DETECTED Final    Comment: (NOTE) SARS-CoV-2 target nucleic acids are NOT DETECTED. The SARS-CoV-2 RNA is generally detectable in upper and lower respiratory specimens during the acute phase of infection.  Negative  results do not preclude SARS-CoV-2 infection, do not rule out co-infections with other pathogens, and should not be used as the sole basis for treatment or other patient management decisions.  Negative results must be combined with clinical observations, patient history, and epidemiological information. The expected result is Not Detected. Fact Sheet for Patients: http://www.biofiredefense.com/wp-content/uploads/2020/03/BIOFIRE-COVID -19-patients.pdf Fact Sheet for Healthcare Providers: http://www.biofiredefense.com/wp-content/uploads/2020/03/BIOFIRE-COVID -19-hcp.pdf This test is not yet approved or cleared by the [place] and  has been authorized for detection and/or diagnosis of SARS-CoV-2 by FDA under an Emergency Use Authorization (EUA).  This EUA  will remain in effec t (meaning this test can be used) for the duration of  the COVID-19 declaration under Section 564(b)(1) of the Act, 21 U.S.C. section 360bbb-3(b)(1), unless the authorization is terminated or revoked sooner. Performed at [facility], [place] [address], [place], [place] [address]   MRSA PCR Screening     Status: None   Collection Time: 08/25/18 10:35 PM   Specimen: Nasal Mucosa; Nasopharyngeal  Result Value Ref Range Status   MRSA by PCR NEGATIVE NEGATIVE Final    Comment:        The GeneXpert MRSA Assay (FDA approved for NASAL specimens only), is one component of a comprehensive MRSA colonization surveillance program. It is not intended to diagnose MRSA infection nor to guide or monitor treatment for MRSA infections. Performed at [facility], [place] [address], [place], [place] [address]     Radiology Reports Dg Chest 2 View  Result Date: 08/20/2018 CLINICAL DATA:  Syncope EXAM: CHEST - 2 VIEW COMPARISON:  10/31/2013 FINDINGS: Scarring and fibrosis within the right greater than left upper lobe as before. No acute consolidation or effusion. Stable cardiomediastinal silhouette with unfolded aorta. No pneumothorax. Degenerative changes of the spine. IMPRESSION: No active cardiopulmonary disease. Stable right greater than left upper lobe scarring and fibrosis. Electronically Signed   By: Madie Reno.D.  On: 08/20/2018 14:49   Ct Head Wo Contrast  Result Date: 08/20/2018 CLINICAL DATA:  Syncopal episode. EXAM: CT HEAD WITHOUT CONTRAST TECHNIQUE: Contiguous axial images were obtained from the base of the skull through the vertex without intravenous contrast. COMPARISON:  None. FINDINGS: Brain: The ventricles are in the midline without mass effect or shift. No extra-axial fluid collections are identified. Age related cerebral atrophy, ventriculomegaly and periventricular white matter disease. Remote appearing lacunar type right basal ganglia infarcts are  noted. No CT findings for acute hemispheric infarction or intracranial hemorrhage. No mass lesions. The brainstem and cerebellum are unremarkable. 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 Vascular: Vascular calcifications but no definite aneurysm or hyperdense vessels. Skull: No skull fracture or bone lesions. Sinuses/Orbits: The paranasal sinuses and mastoid air cells are clear. The globes are intact. Other: No scalp lesions or hematoma. IMPRESSION: 1. No CT findings for an acute intracranial process. 2. 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 3. Small midline lipomas. 4. Age related cerebral atrophy, ventriculomegaly and periventricular white matter disease. Electronically Signed   By: Marijo Sanes M.D.   On: 08/20/2018 14:22   Ir Fluoro Guide Cv Line Right  Result Date: 08/21/2018 INDICATION: 73 year old with end-stage renal disease and starting hemodialysis. EXAM: FLUOROSCOPIC AND ULTRASOUND GUIDED PLACEMENT OF A TUNNELED DIALYSIS CATHETER Physician: Stephan Minister. Anselm Pancoast, MD MEDICATIONS: Ancef 2 g; The antibiotic was administered within an appropriate time interval prior to skin puncture. ANESTHESIA/SEDATION: Versed 2.0 mg IV; Fentanyl 100 mcg IV; Moderate Sedation Time:  20 minutes The patient was continuously monitored during the procedure by the interventional radiology nurse under my direct supervision. FLUOROSCOPY TIME:  Fluoroscopy Time: 36 seconds, 2 mGy COMPLICATIONS: None immediate. PROCEDURE: The procedure was explained to the patient. The risks and benefits of the procedure were discussed and the patient's questions were addressed. Informed consent was obtained from the patient. The patient was placed supine on the interventional table. Ultrasound confirmed a patent right internal jugular vein. Ultrasound images were obtained for documentation. The right neck and chest was prepped and draped in a sterile fashion. The right neck was anesthetized with 1% lidocaine. Maximal  barrier sterile technique was utilized including caps, mask, sterile gowns, sterile gloves, sterile drape, hand hygiene and skin antiseptic. A small incision was made with #11 blade scalpel. A 21 gauge needle directed into the right internal jugular vein with ultrasound guidance. A micropuncture dilator set was placed. A 19 cm tip to cuff Palindrome catheter was selected. The skin below the right clavicle was anesthetized and a small incision was made with an #11 blade scalpel. A subcutaneous tunnel was formed to the vein dermatotomy site. The catheter was brought through the tunnel. The vein dermatotomy site was dilated to accommodate a peel-away sheath. The catheter was placed through the peel-away sheath and directed into the central venous structures. The tip of the catheter was placed at the SVC and right atrium junction with fluoroscopy. Fluoroscopic images were obtained for documentation. Both lumens were found to aspirate and flush well. The proper amount of heparin was flushed in both lumens. The vein dermatotomy site was closed using a single layer of absorbable suture and Dermabond. Gel-Foam was placed in the subcutaneous tract. The catheter was secured to the skin using Prolene suture. IMPRESSION: Successful placement of a right jugular tunneled dialysis catheter using ultrasound and fluoroscopic guidance. Electronically Signed   By: Markus Daft M.D.   On: 08/21/2018 16:14   Ir US Guide Vasc Access  Right  Result Date: 08/21/2018 INDICATION: 73 year old with end-stage renal disease and starting hemodialysis. EXAM: FLUOROSCOPIC AND ULTRASOUND GUIDED PLACEMENT OF A TUNNELED DIALYSIS CATHETER Physician: Stephan Minister. Anselm Pancoast, MD MEDICATIONS: Ancef 2 g; The antibiotic was administered within an appropriate time interval prior to skin puncture. ANESTHESIA/SEDATION: Versed 2.0 mg IV; Fentanyl 100 mcg IV; Moderate Sedation Time:  20 minutes The patient was continuously monitored during the procedure by the  interventional radiology nurse under my direct supervision. FLUOROSCOPY TIME:  Fluoroscopy Time: 36 seconds, 2 mGy COMPLICATIONS: None immediate. PROCEDURE: The procedure was explained to the patient. The risks and benefits of the procedure were discussed and the patient's questions were addressed. Informed consent was obtained from the patient. The patient was placed supine on the interventional table. Ultrasound confirmed a patent right internal jugular vein. Ultrasound images were obtained for documentation. The right neck and chest was prepped and draped in a sterile fashion. The right neck was anesthetized with 1% lidocaine. Maximal barrier sterile technique was utilized including caps, mask, sterile gowns, sterile gloves, sterile drape, hand hygiene and skin antiseptic. A small incision was made with #11 blade scalpel. A 21 gauge needle directed into the right internal jugular vein with ultrasound guidance. A micropuncture dilator set was placed. A 19 cm tip to cuff Palindrome catheter was selected. The skin below the right clavicle was anesthetized and a small incision was made with an #11 blade scalpel. A subcutaneous tunnel was formed to the vein dermatotomy site. The catheter was brought through the tunnel. The vein dermatotomy site was dilated to accommodate a peel-away sheath. The catheter was placed through the peel-away sheath and directed into the central venous structures. The tip of the catheter was placed at the SVC and right atrium junction with fluoroscopy. Fluoroscopic images were obtained for documentation. Both lumens were found to aspirate and flush well. The proper amount of heparin was flushed in both lumens. The vein dermatotomy site was closed using a single layer of absorbable suture and Dermabond. Gel-Foam was placed in the subcutaneous tract. The catheter was secured to the skin using Prolene suture. IMPRESSION: Successful placement of a right jugular tunneled dialysis catheter using  ultrasound and fluoroscopic guidance. Electronically Signed   By: Markus Daft M.D.   On: 08/21/2018 16:14   Vas [place] Upper Ext Vein Mapping (pre-op Avf)  Result Date: 08/22/2018 UPPER EXTREMITY VEIN MAPPING  Indications: Pre-Dialysis access. Limitations: IV/bandages Comparison Study: No prior study on file for comparison Performing Technologist: Sharion Dove RVS  Examination Guidelines: A complete evaluation includes B-mode imaging, spectral Doppler, color Doppler, and power Doppler as needed of all accessible portions of each vessel. Bilateral testing is considered an integral part of a complete examination. Limited examinations for reoccurring indications may be performed as noted. +-----------------+-------------+----------+---------+  Right Cephalic    Diameter (cm) Depth (cm) Findings   +-----------------+-------------+----------+---------+  Prox upper arm        0.46         0.61               +-----------------+-------------+----------+---------+  Mid upper arm         0.45         0.33               +-----------------+-------------+----------+---------+  Dist upper arm        0.43         0.29               +-----------------+-------------+----------+---------+  Antecubital  fossa     0.63         0.14               +-----------------+-------------+----------+---------+  Prox forearm          0.39         0.24    branching  +-----------------+-------------+----------+---------+  Mid forearm           0.41         0.24    branching  +-----------------+-------------+----------+---------+  Wrist                 0.31         0.20               +-----------------+-------------+----------+---------+ +-----------------+-------------+----------+--------------+  Left Cephalic     Diameter (cm) Depth (cm)    Findings     +-----------------+-------------+----------+--------------+  Shoulder                                   not visualized  +-----------------+-------------+----------+--------------+  Prox upper arm                              not visualized  +-----------------+-------------+----------+--------------+  Mid upper arm                              not visualized  +-----------------+-------------+----------+--------------+  Dist upper arm        0.41         1.53                    +-----------------+-------------+----------+--------------+  Antecubital fossa     0.36         3.44                    +-----------------+-------------+----------+--------------+  Prox forearm                               not visualized  +-----------------+-------------+----------+--------------+  Mid forearm                                not visualized  +-----------------+-------------+----------+--------------+  Wrist                                      not visualized  +-----------------+-------------+----------+--------------+ +-----------------+-------------+----------+---------+  Left Basilic      Diameter (cm) Depth (cm) Findings   +-----------------+-------------+----------+---------+  Prox upper arm        0.44         1.55               +-----------------+-------------+----------+---------+  Mid upper arm         0.49         1.01               +-----------------+-------------+----------+---------+  Dist upper arm        0.49         0.89    branching  +-----------------+-------------+----------+---------+  Antecubital fossa     0.44         0.90               +-----------------+-------------+----------+---------+  Prox forearm          0.46         1.19    branching  +-----------------+-------------+----------+---------+  Mid forearm           0.42         1.46               +-----------------+-------------+----------+---------+  Wrist                 0.18         0.13    branching  +-----------------+-------------+----------+---------+ *See table(s) above for measurements and observations.  Diagnosing physician: Monica Martinez MD Electronically signed by Monica Martinez MD on 08/22/2018 at 3:10:20 PM.    Final      Time Spent in minutes  25   Jaymes Revels M.D on 08/26/2018 at 11:30 AM  Between 7am to 7pm - Pager - [phone]  After 7pm go to [email or web address] - password [email or web address]  [facility] -  Office  [phone]

## 2018-08-26 NOTE — Progress Notes (Signed)
[facility]    NEPHROLOGY PROGRESS NOTE  SUBJECTIVE:  Had HD yesterday with 1 kg UF.  She does have transportation to dialysis set up starting on 6/29, Monday.  We discussed that she had a MWF spot at the [facility] clinic with transportation arranged and that she would need to stay here until tomorrow for dialysis due to the transportation issue.   Review of systems:  Shortness of breath on exertion No n/v Denies dark stools or blood per rectum    OBJECTIVE:  Vitals:   08/26/18 0426 08/26/18 0852  BP: 136/68 128/64  Pulse: 71 80  Resp:    Temp: 99.4 F (37.4 C)   SpO2: 99%     Intake/Output Summary (Last 24 hours) at 08/26/2018 8466 Last data filed at 08/26/2018 5993 Gross per 24 hour  Intake 540 ml  Output 1000 ml  Net -460 ml      General:  AAOx3 NAD  HEENT: MMM [place] AT anicteric sclera CV:  Heart RRR  Lungs: clear and unlabored  Abd:  abd SNT/ND Extremities:  No LE edema. Skin:  No skin rash Access: right chest tunneled catheter; LUE AVF with bruit and thrill   MEDICATIONS:  . atorvastatin  10 mg Oral QHS  . Chlorhexidine Gluconate Cloth  6 each Topical Q0600  . Chlorhexidine Gluconate Cloth  6 each Topical Q0600  . [START ON 08/28/2018] darbepoetin (ARANESP) injection - DIALYSIS  60 mcg Intravenous Q Sat-HD  . feeding supplement (PRO-STAT SUGAR FREE 64)  30 mL Oral BID  . labetalol  200 mg Oral BID  . multivitamin  1 tablet Oral QHS  . NIFEdipine  30 mg Oral QHS  . sevelamer carbonate  800 mg Oral TID WC     LABS:   CBC Latest Ref Rng & Units 08/25/2018 08/24/2018 08/23/2018  WBC 4.0 - 10.5 K/uL 10.1 7.9 7.2  Hemoglobin 12.0 - 15.0 g/dL 5.7(LL) 5.9(LL) 6.0(LL)  Hematocrit 36.0 - 46.0 % 19.0(L) 19.7(L) 19.5(L)  Platelets 150 - 400 K/uL 193 214 243    CMP Latest Ref Rng & Units 08/25/2018 08/24/2018 08/23/2018  Glucose 70 - 99 mg/dL 103(H) 104(H) 90  BUN 8 - 23 mg/dL 43(H) 37(H) 81(H)  Creatinine 0.44 - 1.00 mg/dL 9.24(H) 6.82(H) 12.15(H)   Sodium 135 - 145 mmol/L 136 137 136  Potassium 3.5 - 5.1 mmol/L 3.9 3.4(L) 3.9  Chloride 98 - 111 mmol/L 102 102 104  CO2 22 - 32 mmol/L 24 25 22   Calcium 8.9 - 10.3 mg/dL 7.7(L) 7.7(L) 7.9(L)  Total Protein 6.5 - 8.1 g/dL - - -  Total Bilirubin 0.3 - 1.2 mg/dL - - -  Alkaline Phos 38 - 126 U/L - - -  AST 15 - 41 U/L - - -  ALT 0 - 44 U/L - - -    Lab Results  Component Value Date   PTH 16 08/21/2018   CALCIUM 7.7 (L) 08/25/2018   PHOS 4.8 (H) 08/25/2018       Component Value Date/Time   COLORURINE YELLOW 08/20/2018 2040   APPEARANCEUR HAZY (A) 08/20/2018 2040   LABSPEC 1.012 08/20/2018 2040   PHURINE 5.0 08/20/2018 2040   GLUCOSEU NEGATIVE 08/20/2018 2040   HGBUR SMALL (A) 08/20/2018 2040   [place] NEGATIVE 08/20/2018 2040   [place] NEGATIVE 08/20/2018 2040   PROTEINUR 100 (A) 08/20/2018 2040   NITRITE NEGATIVE 08/20/2018 2040   LEUKOCYTESUR LARGE (A) 08/20/2018 2040   No results found for: PHART, PCO2ART, PO2ART, HCO3, TCO2, ACIDBASEDEF,  O2SAT     Component Value Date/Time   IRON 39 08/21/2018 0113   TIBC 204 (L) 08/21/2018 0113   FERRITIN 349 (H) 08/21/2018 0113   IRONPCTSAT 19 08/21/2018 0113       ASSESSMENT/PLAN:      The patient is a 73 y.o. year old female patient with a past medical history significant for stage V chronic kidney disease who presented from her appointment with Dr. Justin Mend after 2 episodes of syncope in the lobby.  She reports feeling weak for quite some time.  In the emergency department, she was noted to have a creatinine of 18.8.     1.    End-stage renal disease.  - HD is per MWF schedule.  She has transportation set up starting 6/29 for outpatient HD.  She has a spot  MWF 1st shift at 6:50 am  - She will need HD here on 6/26, Friday and then would be stable for discharge after dialysis from a renal standpoint - Left arm AVF placed 6/23.  Note plans for vascular follow-up for 6 weeks for dialysis duplex to plan second stage  procedure.   2.  Anemia.  Profound.  Hemoccult was negative.  Denies dark or bloody stools. Likely secondary to chronic kidney disease.  Is refusing blood transfusions due to religious preference.  - Please minimize lab draws - does not need daily electrolyte check.  No labs needed from renal standpoint on 6/25. - Spoke with Market researcher and acceptable for discharge from anemia standpoint based on the Hb of 5.7 - Ordered feraheme 510 mg IV for 6/25.  S/p nulecit 125 mg on 6/20  - She received aranesp 40 mcg on 6/21.  Will dose aranesp 60 mcg on 6/26   3.  Syncope.  Suspect hemodynamic in nature.  Now stable   4.  Hypertension.  Blood pressure currently is stable.    5.  Metabolic acidosis.  on HD  6.  Metabolic bone disease.  PTH low at 16.  Phos improving with HD.  Will stop binder for now and reassess outpatient   [name] 08/26/2018 9:26 AM

## 2018-08-27 DIAGNOSIS — F32A Depression, unspecified: Secondary | ICD-10-CM | POA: Diagnosis present

## 2018-08-27 DIAGNOSIS — Z789 Other specified health status: Secondary | ICD-10-CM | POA: Diagnosis present

## 2018-08-27 DIAGNOSIS — F329 Major depressive disorder, single episode, unspecified: Secondary | ICD-10-CM

## 2018-08-27 DIAGNOSIS — N185 Chronic kidney disease, stage 5: Secondary | ICD-10-CM

## 2018-08-27 DIAGNOSIS — IMO0001 Reserved for inherently not codable concepts without codable children: Secondary | ICD-10-CM | POA: Diagnosis present

## 2018-08-27 DIAGNOSIS — I10 Essential (primary) hypertension: Secondary | ICD-10-CM

## 2018-08-27 DIAGNOSIS — D631 Anemia in chronic kidney disease: Secondary | ICD-10-CM | POA: Diagnosis present

## 2018-08-27 LAB — CBC
HCT: 18.3 % — ABNORMAL LOW (ref 36.0–46.0)
Hemoglobin: 5.4 g/dL — CL (ref 12.0–15.0)
MCH: 26.7 pg (ref 26.0–34.0)
MCHC: 29.5 g/dL — ABNORMAL LOW (ref 30.0–36.0)
MCV: 90.6 fL (ref 80.0–100.0)
Platelets: 165 10*3/uL (ref 150–400)
RBC: 2.02 MIL/uL — ABNORMAL LOW (ref 3.87–5.11)
RDW: 15.8 % — ABNORMAL HIGH (ref 11.5–15.5)
WBC: 14.3 10*3/uL — ABNORMAL HIGH (ref 4.0–10.5)
nRBC: 0 % (ref 0.0–0.2)

## 2018-08-27 LAB — RENAL FUNCTION PANEL
Albumin: 2.4 g/dL — ABNORMAL LOW (ref 3.5–5.0)
Anion gap: 14 (ref 5–15)
BUN: 35 mg/dL — ABNORMAL HIGH (ref 8–23)
CO2: 23 mmol/L (ref 22–32)
Calcium: 8 mg/dL — ABNORMAL LOW (ref 8.9–10.3)
Chloride: 97 mmol/L — ABNORMAL LOW (ref 98–111)
Creatinine, Ser: 7.99 mg/dL — ABNORMAL HIGH (ref 0.44–1.00)
GFR calc Af Amer: 5 mL/min — ABNORMAL LOW (ref >60)
GFR calc non Af Amer: 5 mL/min — ABNORMAL LOW (ref >60)
Glucose, Bld: 109 mg/dL — ABNORMAL HIGH (ref 70–99)
Phosphorus: 3.9 mg/dL (ref 2.5–4.6)
Potassium: 3.7 mmol/L (ref 3.5–5.1)
Sodium: 134 mmol/L — ABNORMAL LOW (ref 135–145)

## 2018-08-27 MED ORDER — RENA-VITE PO TABS: 1.0000 | ORAL_TABLET | Freq: Every day | ORAL | 0 refills | Status: DC

## 2018-08-27 MED ORDER — HYDROCODONE-ACETAMINOPHEN 5-325 MG PO TABS: 1.0000 | ORAL_TABLET | Freq: Three times a day (TID) | ORAL | 0 refills | Status: DC

## 2018-08-27 MED ORDER — HEPARIN SODIUM (PORCINE) 1000 UNIT/ML IJ SOLN
INTRAMUSCULAR | Status: AC
  Filled 2018-08-27: qty 1

## 2018-08-27 MED ORDER — POLYETHYLENE GLYCOL 3350 17 G PO PACK: 17.0000 g | PACK | Freq: Every day | ORAL | 0 refills | Status: DC | PRN

## 2018-08-27 MED ORDER — DARBEPOETIN ALFA 60 MCG/0.3ML IJ SOSY
PREFILLED_SYRINGE | INTRAMUSCULAR | Status: AC
  Filled 2018-08-27: qty 0.3

## 2018-08-27 MED ORDER — DARBEPOETIN ALFA 60 MCG/0.3ML IJ SOSY
PREFILLED_SYRINGE | INTRAMUSCULAR | Status: AC
  Administered 2018-08-27: 60 ug via INTRAVENOUS
  Filled 2018-08-27: qty 0.3

## 2018-08-27 NOTE — Progress Notes (Signed)
OP HD clinic/[place] aware of patient's start on Monday, 08/30/18. Renal Navigator requested that CM add appointment and time (6:15am) to be ready for pick up by [facility] to be added to patient's AVS, which she did.  Patient cleared for discharge from OP HD standpoint.  Alphonzo Cruise, [facility] [phone]

## 2018-08-27 NOTE — Plan of Care (Signed)
  Problem: Activity: Goal: Risk for activity intolerance will decrease Outcome: Completed/Met   Problem: Elimination: Goal: Will not experience complications related to bowel motility Outcome: Completed/Met Goal: Will not experience complications related to urinary retention Outcome: Completed/Met   Problem: Pain Managment: Goal: General experience of comfort will improve Outcome: Completed/Met   Problem: Skin Integrity: Goal: Risk for impaired skin integrity will decrease Outcome: Completed/Met

## 2018-08-27 NOTE — Progress Notes (Signed)
[facility]    NEPHROLOGY PROGRESS NOTE  SUBJECTIVE:  Seen on HD.  156/76 and HR 76 on HD via tunneled catheter and tolerating well.  Needs to have a BM.  She does have transportation to dialysis set up starting on 6/29, Monday.  We discussed that she had a MWF spot at the [facility] clinic with transportation arranged.   Review of systems:  Shortness of breath on exertion No n/v Denies dark stools or blood per rectum    OBJECTIVE:  Vitals:   08/27/18 0900 08/27/18 0930  BP: (!) 151/73 (!) 156/76  Pulse: 79 76  Resp: (!) 21 15  Temp:    SpO2:      Intake/Output Summary (Last 24 hours) at 08/27/2018 0947 Last data filed at 08/27/2018 0700 Gross per 24 hour  Intake 120 ml  Output 101 ml  Net 19 ml      General:  AAOx3 NAD  HEENT: MMM [place] AT anicteric sclera CV:  Heart RRR  Lungs: clear and unlabored  Abd:  abd SNT/ND Extremities:  No LE edema. Skin:  No skin rash Access: right chest tunneled catheter; LUE AVF with bruit and thrill   MEDICATIONS:  . atorvastatin  10 mg Oral QHS  . Chlorhexidine Gluconate Cloth  6 each Topical Q0600  . Chlorhexidine Gluconate Cloth  6 each Topical Q0600  . Chlorhexidine Gluconate Cloth  6 each Topical Q0600  . Darbepoetin Alfa      . labetalol  200 mg Oral BID  . multivitamin  1 tablet Oral QHS  . NIFEdipine  30 mg Oral QHS     LABS:   CBC Latest Ref Rng & Units 08/27/2018 08/25/2018 08/24/2018  WBC 4.0 - 10.5 K/uL 14.3(H) 10.1 7.9  Hemoglobin 12.0 - 15.0 g/dL 5.4(LL) 5.7(LL) 5.9(LL)  Hematocrit 36.0 - 46.0 % 18.3(L) 19.0(L) 19.7(L)  Platelets 150 - 400 K/uL 165 193 214    CMP Latest Ref Rng & Units 08/27/2018 08/25/2018 08/24/2018  Glucose 70 - 99 mg/dL 109(H) 103(H) 104(H)  BUN 8 - 23 mg/dL 35(H) 43(H) 37(H)  Creatinine 0.44 - 1.00 mg/dL 7.99(H) 9.24(H) 6.82(H)  Sodium 135 - 145 mmol/L 134(L) 136 137  Potassium 3.5 - 5.1 mmol/L 3.7 3.9 3.4(L)  Chloride 98 - 111 mmol/L 97(L) 102 102  CO2 22 - 32 mmol/L 23 24 25    Calcium 8.9 - 10.3 mg/dL 8.0(L) 7.7(L) 7.7(L)  Total Protein 6.5 - 8.1 g/dL - - -  Total Bilirubin 0.3 - 1.2 mg/dL - - -  Alkaline Phos 38 - 126 U/L - - -  AST 15 - 41 U/L - - -  ALT 0 - 44 U/L - - -    Lab Results  Component Value Date   PTH 16 08/21/2018   CALCIUM 8.0 (L) 08/27/2018   PHOS 3.9 08/27/2018       Component Value Date/Time   COLORURINE YELLOW 08/20/2018 2040   APPEARANCEUR HAZY (A) 08/20/2018 2040   LABSPEC 1.012 08/20/2018 2040   PHURINE 5.0 08/20/2018 2040   GLUCOSEU NEGATIVE 08/20/2018 2040   HGBUR SMALL (A) 08/20/2018 2040   BILIRUBINUR NEGATIVE 08/20/2018 2040   KETONESUR NEGATIVE 08/20/2018 2040   PROTEINUR 100 (A) 08/20/2018 2040   NITRITE NEGATIVE 08/20/2018 2040   LEUKOCYTESUR LARGE (A) 08/20/2018 2040   No results found for: PHART, PCO2ART, PO2ART, HCO3, TCO2, ACIDBASEDEF, O2SAT     Component Value Date/Time   IRON 39 08/21/2018 0113   TIBC 204 (L) 08/21/2018 0113   FERRITIN  349 (H) 08/21/2018 0113   IRONPCTSAT 19 08/21/2018 0113       ASSESSMENT/PLAN:      The patient is a 73 y.o. year old female patient with a past medical history significant for stage V chronic kidney disease who presented from her appointment with Dr. Justin Myers after 2 episodes of syncope in the lobby.  She reports feeling weak for quite some time.  In the emergency department, she was noted to have a creatinine of 18.8.     1.    End-stage renal disease.  - HD today per MWF schedule.  She has transportation set up starting 6/29 for outpatient HD.  She has a spot [facility] MWF 1st shift at 6:50 am  - Left arm AVF placed 6/23.  Note plans for vascular follow-up for 6 weeks for dialysis duplex to plan second stage procedure.   2.  Anemia.  Profound.  Hemoccult was negative.  Denies dark or bloody stools. Likely secondary to chronic kidney disease.  Is refusing blood transfusions due to religious preference.  - Please minimize lab draws  - Spoke with Dr. Justin [facility] and she is  acceptable for discharge from anemia standpoint based on the Hb of 5.4 - Feraheme 510 mg IV for 6/25.  S/p nulecit 125 mg on 6/20  - She received aranesp 40 mcg on 6/21. Added aranesp 60 mcg once on 6/26   3.  Syncope.  Suspect hemodynamic in nature.  Now stable   4.  Hypertension.  Blood pressure currently is stable.    5.  Metabolic acidosis.  on HD  6.  Metabolic bone disease.  PTH low at 16.  Phos improving with HD.  Will stop binder for now and reassess outpatient   She is stable for discharge from a renal standpoint today.  She understands the risk of worsening anemia including death and does not wish to accept blood products.  I have increased and redosed her ESA and we have given IV iron.  She expressed to me that she did not think that she would not want to be resuscitated in the event that her heart stopped but has not ever discussed this with her primary team.  She was frustrated due to needing to have a bowel movement and tells me that she does not want to talk further.  She is currently listed as full code and this needs to be clarified especially in light of severe anemia and not accepting transfusions.  I have just reached out to her primary team to discuss this and am awaiting their call.   [facility] 08/27/2018 9:47 AM

## 2018-08-27 NOTE — Discharge Summary (Signed)
Physician Discharge Summary  Marikay Roads ERX:[id number] DOB: 1945/03/16 DOA: 08/20/2018  PCP: [name], MD  Admit date: 08/20/2018 Discharge date: 08/27/2018  Admitted From: Home Disposition: Home  Recommendations for Outpatient Follow-up:  1. Follow up with PCP in 1-2 weeks 2 patient is now on dialysis M, W, F at [facility].  Next dialysis is on 6/29.  Please monitor her hemoglobin as it is severely low.  Patient is a Sales promotion account executive Witness as well. 3.  Cymbalta has been discontinued..  Please switch her to an alternative antidepressant during outpatient follow-up.  Home Health: None Equipment/Devices: None  Discharge Condition: Fair CODE STATUS: Full code (patient expressed that she would like to talk to her family regarding goals of care but for now would prefer full scope of treatment)  Diet recommendation: Renal    Discharge Diagnoses:  Principal Problem:   Anemia of chronic kidney failure, stage 5 ([place])   Active Problems:   CKD (chronic kidney disease) requiring chronic dialysis ([place])   Syncope   Anemia   Chronic depression  Brief narrative/HPI 73 year old female Jehovah's Witness with chronic kidney disease stage V presenting with syncopal episode.  Her primary nephrologist had advised that she should be started on dialysis but was refusing. In the ED she had markedly elevated troponin of 18.8, potassium 4.8 and hemoglobin of 7.  Fecal Hemoccult was negative.  Patient started on hemodialysis this admission. Patient tested negative for COVID-19.  Hospital course Principal problems:   CKD (chronic kidney disease) requiring chronic dialysis ([place]) Has been CLiped for M, W, F dialysis in [place].    Received her dialysis today and can be discharged home.  Next dialysis will be on 6/29. Left-sided aVF placement 6/23, currently getting dialysis through permacath.   Anemia of chronic disease, severe Hemoglobin of 5. 4 this morning.  Asymptomatic.  Received IV iron  and Aranesp with dialysis..   Patient is Jehovah's Witness and strictly refuses any blood products.  continue Aranesp with dialysis.  Please monitor hemoglobin as outpatient.  Syncope Suspect due to intravascular depletion.  Now resolved.  Metabolic acidosis Corrected with dialysis.  Chronic depression Cymbalta discontinued since patient has end-stage renal disease.  Can be placed on suitable alternative by her primary as outpatient.  Essential hypertension Stable.  Resume home meds.  Lasix discontinued   Family Communication  : None at bedside  Disposition Plan  : Home   Consults  : Nephrology/vascular  Procedures  : Left AV fistula on 6/23  Discharge Instructions   Allergies as of 08/27/2018   No Known Allergies     Medication List    STOP taking these medications   Biotin 5000 5 MG Caps Generic drug: Biotin   DULoxetine 60 MG capsule Commonly known as: CYMBALTA   furosemide 40 MG tablet Commonly known as: LASIX   sodium bicarbonate 650 MG tablet     TAKE these medications   acetaminophen 325 MG tablet Commonly known as: TYLENOL Take 650 mg by mouth every 6 (six) hours as needed for headache.   atorvastatin 10 MG tablet Commonly known as: LIPITOR Take 10 mg by mouth at bedtime.   Fish Oil 1000 MG Caps Take by mouth.   HYDROcodone-acetaminophen 5-325 MG tablet Commonly known as: NORCO/VICODIN Take 1 tablet by mouth every 8 (eight) hours.   labetalol 200 MG tablet Commonly known as: NORMODYNE Take 200 mg by mouth 2 (two) times daily.   multivitamin Tabs tablet Take 1 tablet by mouth at bedtime.   NIFEdipine  30 MG 24 hr tablet Commonly known as: ADALAT CC Take 30 mg by mouth at bedtime.   polyethylene glycol 17 g packet Commonly known as: MiraLax Take 17 g by mouth daily as needed for moderate constipation.   Turmeric 500 MG Caps Take 500 mg by mouth daily.   vitamin C 1000 MG tablet Take 1,000 mg by mouth 2 (two) times a day.       Follow-up Information    [facility] Follow up.   Why: [facility] will pick you up Monday morning at 6:15 AM to transport you to hemodialysis for your 7:15 AM appointment       [name], MD. Schedule an appointment as soon as possible for a visit in 1 week(s).   Specialty: Family Medicine Contact information: [address] [address] [phone]          No Known Allergies      Procedures/Studies: Dg Chest 2 View  Result Date: 08/20/2018 CLINICAL DATA:  Syncope EXAM: CHEST - 2 VIEW COMPARISON:  10/31/2013 FINDINGS: Scarring and fibrosis within the right greater than left upper lobe as before. No acute consolidation or effusion. Stable cardiomediastinal silhouette with unfolded aorta. No pneumothorax. Degenerative changes of the spine. IMPRESSION: No active cardiopulmonary disease. Stable right greater than left upper lobe scarring and fibrosis. Electronically Signed   By: Donavan Foil M.D.   On: 08/20/2018 14:49   Ct Head Wo Contrast  Result Date: 08/20/2018 CLINICAL DATA:  Syncopal episode. EXAM: CT HEAD WITHOUT CONTRAST TECHNIQUE: Contiguous axial images were obtained from the base of the skull through the vertex without intravenous contrast. COMPARISON:  None. FINDINGS: Brain: The ventricles are in the midline without mass effect or shift. No extra-axial fluid collections are identified. Age related cerebral atrophy, ventriculomegaly and periventricular white matter disease. Remote appearing lacunar type right basal ganglia infarcts are noted. No CT findings for acute hemispheric infarction or intracranial hemorrhage. No mass lesions. The brainstem and cerebellum are unremarkable. 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 Vascular: Vascular calcifications but no definite aneurysm or hyperdense vessels. Skull: No skull fracture or bone lesions. Sinuses/Orbits: The paranasal sinuses and mastoid air cells are clear. The globes are  intact. Other: No scalp lesions or hematoma. IMPRESSION: 1. No CT findings for an acute intracranial process. 2. 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 3. Small midline lipomas. 4. Age related cerebral atrophy, ventriculomegaly and periventricular white matter disease. Electronically Signed   By: Marijo Sanes M.D.   On: 08/20/2018 14:22   Ir Fluoro Guide Cv Line Right  Result Date: 08/21/2018 INDICATION: 73 year old with end-stage renal disease and starting hemodialysis. EXAM: FLUOROSCOPIC AND ULTRASOUND GUIDED PLACEMENT OF A TUNNELED DIALYSIS CATHETER Physician: [place] Minister. Anselm Pancoast, MD MEDICATIONS: Ancef 2 g; The antibiotic was administered within an appropriate time interval prior to skin puncture. ANESTHESIA/SEDATION: Versed 2.0 mg IV; Fentanyl 100 mcg IV; Moderate Sedation Time:  20 minutes The patient was continuously monitored during the procedure by the interventional radiology nurse under my direct supervision. FLUOROSCOPY TIME:  Fluoroscopy Time: 36 seconds, 2 mGy COMPLICATIONS: None immediate. PROCEDURE: The procedure was explained to the patient. The risks and benefits of the procedure were discussed and the patient's questions were addressed. Informed consent was obtained from the patient. The patient was placed supine on the interventional table. Ultrasound confirmed a patent right internal jugular vein. Ultrasound images were obtained for documentation. The right neck and chest was  prepped and draped in a sterile fashion. The right neck was anesthetized with 1% lidocaine. Maximal barrier sterile technique was utilized including caps, mask, sterile gowns, sterile gloves, sterile drape, hand hygiene and skin antiseptic. A small incision was made with #11 blade scalpel. A 21 gauge needle directed into the right internal jugular vein with ultrasound guidance. A micropuncture dilator set was placed. A 19 cm tip to cuff Palindrome catheter was selected. The skin below the  right clavicle was anesthetized and a small incision was made with an #11 blade scalpel. A subcutaneous tunnel was formed to the vein dermatotomy site. The catheter was brought through the tunnel. The vein dermatotomy site was dilated to accommodate a peel-away sheath. The catheter was placed through the peel-away sheath and directed into the central venous structures. The tip of the catheter was placed at the SVC and right atrium junction with fluoroscopy. Fluoroscopic images were obtained for documentation. Both lumens were found to aspirate and flush well. The proper amount of heparin was flushed in both lumens. The vein dermatotomy site was closed using a single layer of absorbable suture and Dermabond. Gel-Foam was placed in the subcutaneous tract. The catheter was secured to the skin using Prolene suture. IMPRESSION: Successful placement of a right jugular tunneled dialysis catheter using ultrasound and fluoroscopic guidance. Electronically Signed   By: Markus Daft M.D.   On: 08/21/2018 16:14   Ir US Guide Vasc Access Right  Result Date: 08/21/2018 INDICATION: 72 year old with end-stage renal disease and starting hemodialysis. EXAM: FLUOROSCOPIC AND ULTRASOUND GUIDED PLACEMENT OF A TUNNELED DIALYSIS CATHETER Physician: Stephan Minister. Anselm Pancoast, MD MEDICATIONS: Ancef 2 g; The antibiotic was administered within an appropriate time interval prior to skin puncture. ANESTHESIA/SEDATION: Versed 2.0 mg IV; Fentanyl 100 mcg IV; Moderate Sedation Time:  20 minutes The patient was continuously monitored during the procedure by the interventional radiology nurse under my direct supervision. FLUOROSCOPY TIME:  Fluoroscopy Time: 36 seconds, 2 mGy COMPLICATIONS: None immediate. PROCEDURE: The procedure was explained to the patient. The risks and benefits of the procedure were discussed and the patient's questions were addressed. Informed consent was obtained from the patient. The patient was placed supine on the interventional  table. Ultrasound confirmed a patent right internal jugular vein. Ultrasound images were obtained for documentation. The right neck and chest was prepped and draped in a sterile fashion. The right neck was anesthetized with 1% lidocaine. Maximal barrier sterile technique was utilized including caps, mask, sterile gowns, sterile gloves, sterile drape, hand hygiene and skin antiseptic. A small incision was made with #11 blade scalpel. A 21 gauge needle directed into the right internal jugular vein with ultrasound guidance. A micropuncture dilator set was placed. A 19 cm tip to cuff Palindrome catheter was selected. The skin below the right clavicle was anesthetized and a small incision was made with an #11 blade scalpel. A subcutaneous tunnel was formed to the vein dermatotomy site. The catheter was brought through the tunnel. The vein dermatotomy site was dilated to accommodate a peel-away sheath. The catheter was placed through the peel-away sheath and directed into the central venous structures. The tip of the catheter was placed at the SVC and right atrium junction with fluoroscopy. Fluoroscopic images were obtained for documentation. Both lumens were found to aspirate and flush well. The proper amount of heparin was flushed in both lumens. The vein dermatotomy site was closed using a single layer of absorbable suture and Dermabond. Gel-Foam was placed in the subcutaneous tract. The catheter was secured  to the skin using Prolene suture. IMPRESSION: Successful placement of a right jugular tunneled dialysis catheter using ultrasound and fluoroscopic guidance. Electronically Signed   By: Markus Daft M.D.   On: 08/21/2018 16:14   Vas [place] Upper Ext Vein Mapping (pre-op Avf)  Result Date: 08/22/2018 UPPER EXTREMITY VEIN MAPPING  Indications: Pre-Dialysis access. Limitations: IV/bandages Comparison Study: No prior study on file for comparison Performing Technologist: Sharion Dove RVS  Examination Guidelines: A  complete evaluation includes B-mode imaging, spectral Doppler, color Doppler, and power Doppler as needed of all accessible portions of each vessel. Bilateral testing is considered an integral part of a complete examination. Limited examinations for reoccurring indications may be performed as noted. +-----------------+-------------+----------+---------+ Right Cephalic   Diameter (cm)Depth (cm)Findings  +-----------------+-------------+----------+---------+ Prox upper arm       0.46        0.61             +-----------------+-------------+----------+---------+ Mid upper arm        0.45        0.33             +-----------------+-------------+----------+---------+ Dist upper arm       0.43        0.29             +-----------------+-------------+----------+---------+ Antecubital fossa    0.63        0.14             +-----------------+-------------+----------+---------+ Prox forearm         0.39        0.24   branching +-----------------+-------------+----------+---------+ Mid forearm          0.41        0.24   branching +-----------------+-------------+----------+---------+ Wrist                0.31        0.20             +-----------------+-------------+----------+---------+ +-----------------+-------------+----------+--------------+ Left Cephalic    Diameter (cm)Depth (cm)   Findings    +-----------------+-------------+----------+--------------+ Shoulder                                not visualized +-----------------+-------------+----------+--------------+ Prox upper arm                          not visualized +-----------------+-------------+----------+--------------+ Mid upper arm                           not visualized +-----------------+-------------+----------+--------------+ Dist upper arm       0.41        1.53                  +-----------------+-------------+----------+--------------+ Antecubital fossa    0.36        3.44                   +-----------------+-------------+----------+--------------+ Prox forearm                            not visualized +-----------------+-------------+----------+--------------+ Mid forearm                             not visualized +-----------------+-------------+----------+--------------+ Wrist  not visualized +-----------------+-------------+----------+--------------+ +-----------------+-------------+----------+---------+ Left Basilic     Diameter (cm)Depth (cm)Findings  +-----------------+-------------+----------+---------+ Prox upper arm       0.44        1.55             +-----------------+-------------+----------+---------+ Mid upper arm        0.49        1.01             +-----------------+-------------+----------+---------+ Dist upper arm       0.49        0.89   branching +-----------------+-------------+----------+---------+ Antecubital fossa    0.44        0.90             +-----------------+-------------+----------+---------+ Prox forearm         0.46        1.19   branching +-----------------+-------------+----------+---------+ Mid forearm          0.42        1.46             +-----------------+-------------+----------+---------+ Wrist                0.18        0.13   branching +-----------------+-------------+----------+---------+ *See table(s) above for measurements and observations.  Diagnosing physician: Monica Martinez MD Electronically signed by Monica Martinez MD on 08/22/2018 at 3:10:20 PM.    Final        Subjective: Seen after dialysis.  Denies any specific symptoms.  Left arm pain better.  Discharge Exam: Vitals:   08/27/18 0930 08/27/18 0954  BP: (!) 156/76 (!) 154/84  Pulse: 76 84  Resp: 15 16  Temp:  98.3 F (36.8 C)  SpO2:  96%   Vitals:   08/27/18 0830 08/27/18 0900 08/27/18 0930 08/27/18 0954  BP: (!) 137/11 (!) 151/73 (!) 156/76 (!) 154/84  Pulse: 93 79 76 84  Resp:  19 (!) 21 15 16   Temp:    98.3 F (36.8 C)  TempSrc:    Oral  SpO2:    96%  Weight:    81.4 kg  Height:        General: Not in distress HEENT: Pallor present, moist mucosa Chest: Right-sided permacath, clear bilaterally CVs: Normal S1-S2 GI: Soft, nondistended, nontender Musculoskeletal: Warm, no edema, left upper extremity AV fistula     The results of significant diagnostics from this hospitalization (including imaging, microbiology, ancillary and laboratory) are listed below for reference.     Microbiology: Recent Results (from the past 240 hour(s))  SARS Coronavirus 2     Status: None   Collection Time: 08/20/18  3:11 PM  Result Value Ref Range Status   SARS Coronavirus 2 NOT DETECTED NOT DETECTED Final    Comment: (NOTE) SARS-CoV-2 target nucleic acids are NOT DETECTED. The SARS-CoV-2 RNA is generally detectable in upper and lower respiratory specimens during the acute phase of infection.  Negative  results do not preclude SARS-CoV-2 infection, do not rule out co-infections with other pathogens, and should not be used as the sole basis for treatment or other patient management decisions.  Negative results must be combined with clinical observations, patient history, and epidemiological information. The expected result is Not Detected. Fact Sheet for Patients: http://www.biofiredefense.com/wp-content/uploads/2020/03/BIOFIRE-COVID -19-patients.pdf Fact Sheet for Healthcare Providers: http://www.biofiredefense.com/wp-content/uploads/2020/03/BIOFIRE-COVID -19-hcp.pdf This test is not yet approved or cleared by the [place] and  has been authorized for detection and/or diagnosis of SARS-CoV-2 by FDA under an Emergency Use Authorization (EUA).  This EUA  will remain in effec t (meaning this test can be used) for the duration of  the COVID-19 declaration under Section 564(b)(1) of the Act, 21 U.S.C. section 360bbb-3(b)(1), unless the authorization  is terminated or revoked sooner. Performed at [facility], [place] [address], [place], [place] [address]   MRSA PCR Screening     Status: None   Collection Time: 08/25/18 10:35 PM   Specimen: Nasal Mucosa; Nasopharyngeal  Result Value Ref Range Status   MRSA by PCR NEGATIVE NEGATIVE Final    Comment:        The GeneXpert MRSA Assay (FDA approved for NASAL specimens only), is one component of a comprehensive MRSA colonization surveillance program. It is not intended to diagnose MRSA infection nor to guide or monitor treatment for MRSA infections. Performed at [facility], [place] [address], [place], [place] [address]      Labs: BNP (last 3 results) No results for input(s): BNP in the last 8760 hours. Basic Metabolic Panel: Recent Labs  Lab 08/21/18 0113 08/23/18 0528 08/24/18 0542 08/25/18 0755 08/27/18 0714  NA 136 136 137 136 134*  K 4.3 3.9 3.4* 3.9 3.7  CL 105 104 102 102 97*  CO2 17* 22 25 24 23   GLUCOSE 129* 90 104* 103* 109*  BUN 136* 81* 37* 43* 35*  CREATININE 17.79* 12.15* 6.82* 9.24* 7.99*  CALCIUM 8.3* 7.9* 7.7* 7.7* 8.0*  PHOS 6.2*  --   --  4.8* 3.9   Liver Function Tests: Recent Labs  Lab 08/20/18 1324 08/25/18 0755 08/27/18 0714  AST 9*  --   --   ALT 10  --   --   ALKPHOS 47  --   --   BILITOT 0.8  --   --   PROT 6.3*  --   --   ALBUMIN 3.3* 2.4* 2.4*   No results for input(s): LIPASE, AMYLASE in the last 168 hours. No results for input(s): AMMONIA in the last 168 hours. CBC: Recent Labs  Lab 08/20/18 1324 08/22/18 0104 08/23/18 0528 08/24/18 0542 08/25/18 0755 08/27/18 0713  WBC 7.0 7.9 7.2 7.9 10.1 14.3*  NEUTROABS 5.5  --   --   --   --   --   HGB 7.0* 6.3* 6.0* 5.9* 5.7* 5.4*  HCT 22.8* 20.0* 19.5* 19.7* 19.0* 18.3*  MCV 88.0 87.0 88.2 88.3 90.9 90.6  PLT 350 294 243 214 193 165   Cardiac Enzymes: Recent Labs  Lab 08/20/18 1324 08/20/18 1910 08/21/18 0113 08/21/18 0827  TROPONINI 0.04* 0.03* 0.04* 0.04*    BNP: Invalid input(s): POCBNP CBG: Recent Labs  Lab 08/24/18 1112  GLUCAP 123*   D-Dimer No results for input(s): DDIMER in the last 72 hours. Hgb A1c No results for input(s): HGBA1C in the last 72 hours. Lipid Profile No results for input(s): CHOL, HDL, LDLCALC, TRIG, CHOLHDL, LDLDIRECT in the last 72 hours. Thyroid function studies No results for input(s): TSH, T4TOTAL, T3FREE, THYROIDAB in the last 72 hours.  Invalid input(s): FREET3 Anemia work up No results for input(s): VITAMINB12, FOLATE, FERRITIN, TIBC, IRON, RETICCTPCT in the last 72 hours. Urinalysis    Component Value Date/Time   COLORURINE YELLOW 08/20/2018 2040   APPEARANCEUR HAZY (A) 08/20/2018 2040   LABSPEC 1.012 08/20/2018 2040   PHURINE 5.0 08/20/2018 2040   GLUCOSEU NEGATIVE 08/20/2018 2040   HGBUR SMALL (A) 08/20/2018 2040   BILIRUBINUR NEGATIVE 08/20/2018 2040   KETONESUR NEGATIVE 08/20/2018 2040   PROTEINUR 100 (A) 08/20/2018 2040  NITRITE NEGATIVE 08/20/2018 2040   LEUKOCYTESUR LARGE (A) 08/20/2018 2040   Sepsis Labs Invalid input(s): PROCALCITONIN,  WBC,  LACTICIDVEN Microbiology Recent Results (from the past 240 hour(s))  SARS Coronavirus 2     Status: None   Collection Time: 08/20/18  3:11 PM  Result Value Ref Range Status   SARS Coronavirus 2 NOT DETECTED NOT DETECTED Final    Comment: (NOTE) SARS-CoV-2 target nucleic acids are NOT DETECTED. The SARS-CoV-2 RNA is generally detectable in upper and lower respiratory specimens during the acute phase of infection.  Negative  results do not preclude SARS-CoV-2 infection, do not rule out co-infections with other pathogens, and should not be used as the sole basis for treatment or other patient management decisions.  Negative results must be combined with clinical observations, patient history, and epidemiological information. The expected result is Not Detected. Fact Sheet for  Patients: http://www.biofiredefense.com/wp-content/uploads/2020/03/BIOFIRE-COVID -19-patients.pdf Fact Sheet for Healthcare Providers: http://www.biofiredefense.com/wp-content/uploads/2020/03/BIOFIRE-COVID -19-hcp.pdf This test is not yet approved or cleared by the [place] and  has been authorized for detection and/or diagnosis of SARS-CoV-2 by FDA under an Emergency Use Authorization (EUA).  This EUA will remain in effec t (meaning this test can be used) for the duration of  the COVID-19 declaration under Section 564(b)(1) of the Act, 21 U.S.C. section 360bbb-3(b)(1), unless the authorization is terminated or revoked sooner. Performed at [facility], [place] [address], [place], [place] [address]   MRSA PCR Screening     Status: None   Collection Time: 08/25/18 10:35 PM   Specimen: Nasal Mucosa; Nasopharyngeal  Result Value Ref Range Status   MRSA by PCR NEGATIVE NEGATIVE Final    Comment:        The GeneXpert MRSA Assay (FDA approved for NASAL specimens only), is one component of a comprehensive MRSA colonization surveillance program. It is not intended to diagnose MRSA infection nor to guide or monitor treatment for MRSA infections. Performed at [facility], [place] [address], [place], [place] [address]      Time coordinating discharge:35 minutes  SIGNED:   Louellen Molder, MD  [facility] 08/27/2018, 10:28 AM Pager   If 7PM-7AM, please contact night-coverage [email or web address] Password [email or web address]

## 2018-08-30 ENCOUNTER — Encounter (HOSPITAL_COMMUNITY): Payer: Self-pay | Admitting: Emergency Medicine

## 2018-08-30 ENCOUNTER — Emergency Department (HOSPITAL_COMMUNITY): Payer: Medicare HMO

## 2018-08-30 ENCOUNTER — Inpatient Hospital Stay (HOSPITAL_COMMUNITY)
Admission: EM | Admit: 2018-08-30 | Discharge: 2018-09-04 | DRG: 291 | Disposition: A | Discharge status: Home Health | Payer: Medicare HMO | Attending: Family Medicine | Admitting: Family Medicine

## 2018-08-30 ENCOUNTER — Other Ambulatory Visit: Payer: Self-pay

## 2018-08-30 DIAGNOSIS — Z79891 Long term (current) use of opiate analgesic: Secondary | ICD-10-CM

## 2018-08-30 DIAGNOSIS — Z9181 History of falling: Secondary | ICD-10-CM

## 2018-08-30 DIAGNOSIS — N186 End stage renal disease: Secondary | ICD-10-CM

## 2018-08-30 DIAGNOSIS — D631 Anemia in chronic kidney disease: Secondary | ICD-10-CM | POA: Diagnosis present

## 2018-08-30 DIAGNOSIS — I509 Heart failure, unspecified: Secondary | ICD-10-CM

## 2018-08-30 DIAGNOSIS — F329 Major depressive disorder, single episode, unspecified: Secondary | ICD-10-CM | POA: Diagnosis present

## 2018-08-30 DIAGNOSIS — R0602 Shortness of breath: Secondary | ICD-10-CM

## 2018-08-30 DIAGNOSIS — F32A Depression, unspecified: Secondary | ICD-10-CM | POA: Diagnosis present

## 2018-08-30 DIAGNOSIS — E611 Iron deficiency: Secondary | ICD-10-CM | POA: Diagnosis present

## 2018-08-30 DIAGNOSIS — F419 Anxiety disorder, unspecified: Secondary | ICD-10-CM | POA: Diagnosis present

## 2018-08-30 DIAGNOSIS — R7989 Other specified abnormal findings of blood chemistry: Secondary | ICD-10-CM

## 2018-08-30 DIAGNOSIS — M25519 Pain in unspecified shoulder: Secondary | ICD-10-CM

## 2018-08-30 DIAGNOSIS — Z79899 Other long term (current) drug therapy: Secondary | ICD-10-CM

## 2018-08-30 DIAGNOSIS — N185 Chronic kidney disease, stage 5: Secondary | ICD-10-CM | POA: Diagnosis not present

## 2018-08-30 DIAGNOSIS — J9691 Respiratory failure, unspecified with hypoxia: Secondary | ICD-10-CM | POA: Diagnosis not present

## 2018-08-30 DIAGNOSIS — Z789 Other specified health status: Secondary | ICD-10-CM

## 2018-08-30 DIAGNOSIS — Z20828 Contact with and (suspected) exposure to other viral communicable diseases: Secondary | ICD-10-CM | POA: Diagnosis not present

## 2018-08-30 DIAGNOSIS — I10 Essential (primary) hypertension: Secondary | ICD-10-CM | POA: Diagnosis present

## 2018-08-30 DIAGNOSIS — D649 Anemia, unspecified: Secondary | ICD-10-CM

## 2018-08-30 DIAGNOSIS — Z87891 Personal history of nicotine dependence: Secondary | ICD-10-CM

## 2018-08-30 DIAGNOSIS — Z992 Dependence on renal dialysis: Secondary | ICD-10-CM

## 2018-08-30 DIAGNOSIS — N2581 Secondary hyperparathyroidism of renal origin: Secondary | ICD-10-CM | POA: Diagnosis present

## 2018-08-30 DIAGNOSIS — E1122 Type 2 diabetes mellitus with diabetic chronic kidney disease: Secondary | ICD-10-CM | POA: Diagnosis present

## 2018-08-30 DIAGNOSIS — I132 Hypertensive heart and chronic kidney disease with heart failure and with stage 5 chronic kidney disease, or end stage renal disease: Principal | ICD-10-CM | POA: Diagnosis present

## 2018-08-30 DIAGNOSIS — R0609 Other forms of dyspnea: Secondary | ICD-10-CM | POA: Diagnosis not present

## 2018-08-30 DIAGNOSIS — I160 Hypertensive urgency: Secondary | ICD-10-CM | POA: Diagnosis not present

## 2018-08-30 DIAGNOSIS — IMO0001 Reserved for inherently not codable concepts without codable children: Secondary | ICD-10-CM | POA: Diagnosis present

## 2018-08-30 DIAGNOSIS — Z531 Procedure and treatment not carried out because of patient's decision for reasons of belief and group pressure: Secondary | ICD-10-CM | POA: Diagnosis present

## 2018-08-30 DIAGNOSIS — J189 Pneumonia, unspecified organism: Secondary | ICD-10-CM

## 2018-08-30 LAB — I-STAT CHEM 8, ED
BUN: 51 mg/dL — ABNORMAL HIGH (ref 8–23)
Calcium, Ion: 1.08 mmol/L — ABNORMAL LOW (ref 1.15–1.40)
Chloride: 99 mmol/L (ref 98–111)
Creatinine, Ser: 9.4 mg/dL — ABNORMAL HIGH (ref 0.44–1.00)
Glucose, Bld: 133 mg/dL — ABNORMAL HIGH (ref 70–99)
HCT: 22 % — ABNORMAL LOW (ref 36.0–46.0)
Hemoglobin: 7.5 g/dL — ABNORMAL LOW (ref 12.0–15.0)
Potassium: 4.4 mmol/L (ref 3.5–5.1)
Sodium: 133 mmol/L — ABNORMAL LOW (ref 135–145)
TCO2: 24 mmol/L (ref 22–32)

## 2018-08-30 LAB — CBC WITH DIFFERENTIAL/PLATELET
Abs Immature Granulocytes: 0.08 10*3/uL — ABNORMAL HIGH (ref 0.00–0.07)
Basophils Absolute: 0 10*3/uL (ref 0.0–0.1)
Basophils Relative: 0 %
Eosinophils Absolute: 0.4 10*3/uL (ref 0.0–0.5)
Eosinophils Relative: 4 %
HCT: 21.3 % — ABNORMAL LOW (ref 36.0–46.0)
Hemoglobin: 6.3 g/dL — CL (ref 12.0–15.0)
Immature Granulocytes: 1 %
Lymphocytes Relative: 12 %
Lymphs Abs: 1.2 10*3/uL (ref 0.7–4.0)
MCH: 27 pg (ref 26.0–34.0)
MCHC: 29.6 g/dL — ABNORMAL LOW (ref 30.0–36.0)
MCV: 91.4 fL (ref 80.0–100.0)
Monocytes Absolute: 0.9 10*3/uL (ref 0.1–1.0)
Monocytes Relative: 8 %
Neutro Abs: 7.9 10*3/uL — ABNORMAL HIGH (ref 1.7–7.7)
Neutrophils Relative %: 75 %
Platelets: 246 10*3/uL (ref 150–400)
RBC: 2.33 MIL/uL — ABNORMAL LOW (ref 3.87–5.11)
RDW: 16.2 % — ABNORMAL HIGH (ref 11.5–15.5)
WBC: 10.4 10*3/uL (ref 4.0–10.5)
nRBC: 0.4 % — ABNORMAL HIGH (ref 0.0–0.2)

## 2018-08-30 LAB — BASIC METABOLIC PANEL
Anion gap: 16 — ABNORMAL HIGH (ref 5–15)
BUN: 52 mg/dL — ABNORMAL HIGH (ref 8–23)
CO2: 21 mmol/L — ABNORMAL LOW (ref 22–32)
Calcium: 8.9 mg/dL (ref 8.9–10.3)
Chloride: 99 mmol/L (ref 98–111)
Creatinine, Ser: 9 mg/dL — ABNORMAL HIGH (ref 0.44–1.00)
GFR calc Af Amer: 5 mL/min — ABNORMAL LOW (ref >60)
GFR calc non Af Amer: 4 mL/min — ABNORMAL LOW (ref >60)
Glucose, Bld: 138 mg/dL — ABNORMAL HIGH (ref 70–99)
Potassium: 4.6 mmol/L (ref 3.5–5.1)
Sodium: 136 mmol/L (ref 135–145)

## 2018-08-30 LAB — SARS CORONAVIRUS 2 BY RT PCR (HOSPITAL ORDER, PERFORMED IN ~~LOC~~ HOSPITAL LAB): SARS Coronavirus 2: NEGATIVE

## 2018-08-30 LAB — LACTIC ACID, PLASMA
Lactic Acid, Venous: 1.4 mmol/L (ref 0.5–1.9)
Lactic Acid, Venous: 1.3 mmol/L (ref 0.5–1.9)

## 2018-08-30 LAB — BRAIN NATRIURETIC PEPTIDE: B Natriuretic Peptide: 1864.9 pg/mL — ABNORMAL HIGH (ref 0.0–100.0)

## 2018-08-30 LAB — GLUCOSE, CAPILLARY
Glucose-Capillary: 107 mg/dL — ABNORMAL HIGH (ref 70–99)
Glucose-Capillary: 126 mg/dL — ABNORMAL HIGH (ref 70–99)

## 2018-08-30 IMAGING — DX PORTABLE CHEST - 1 VIEW
1 series · 1 of 1 positions shown · non-contrast
Comparison: [DATE]

CLINICAL DATA: Shortness of breath, chest pain, cough

EXAM:
PORTABLE CHEST 1 VIEW

[chest ap]
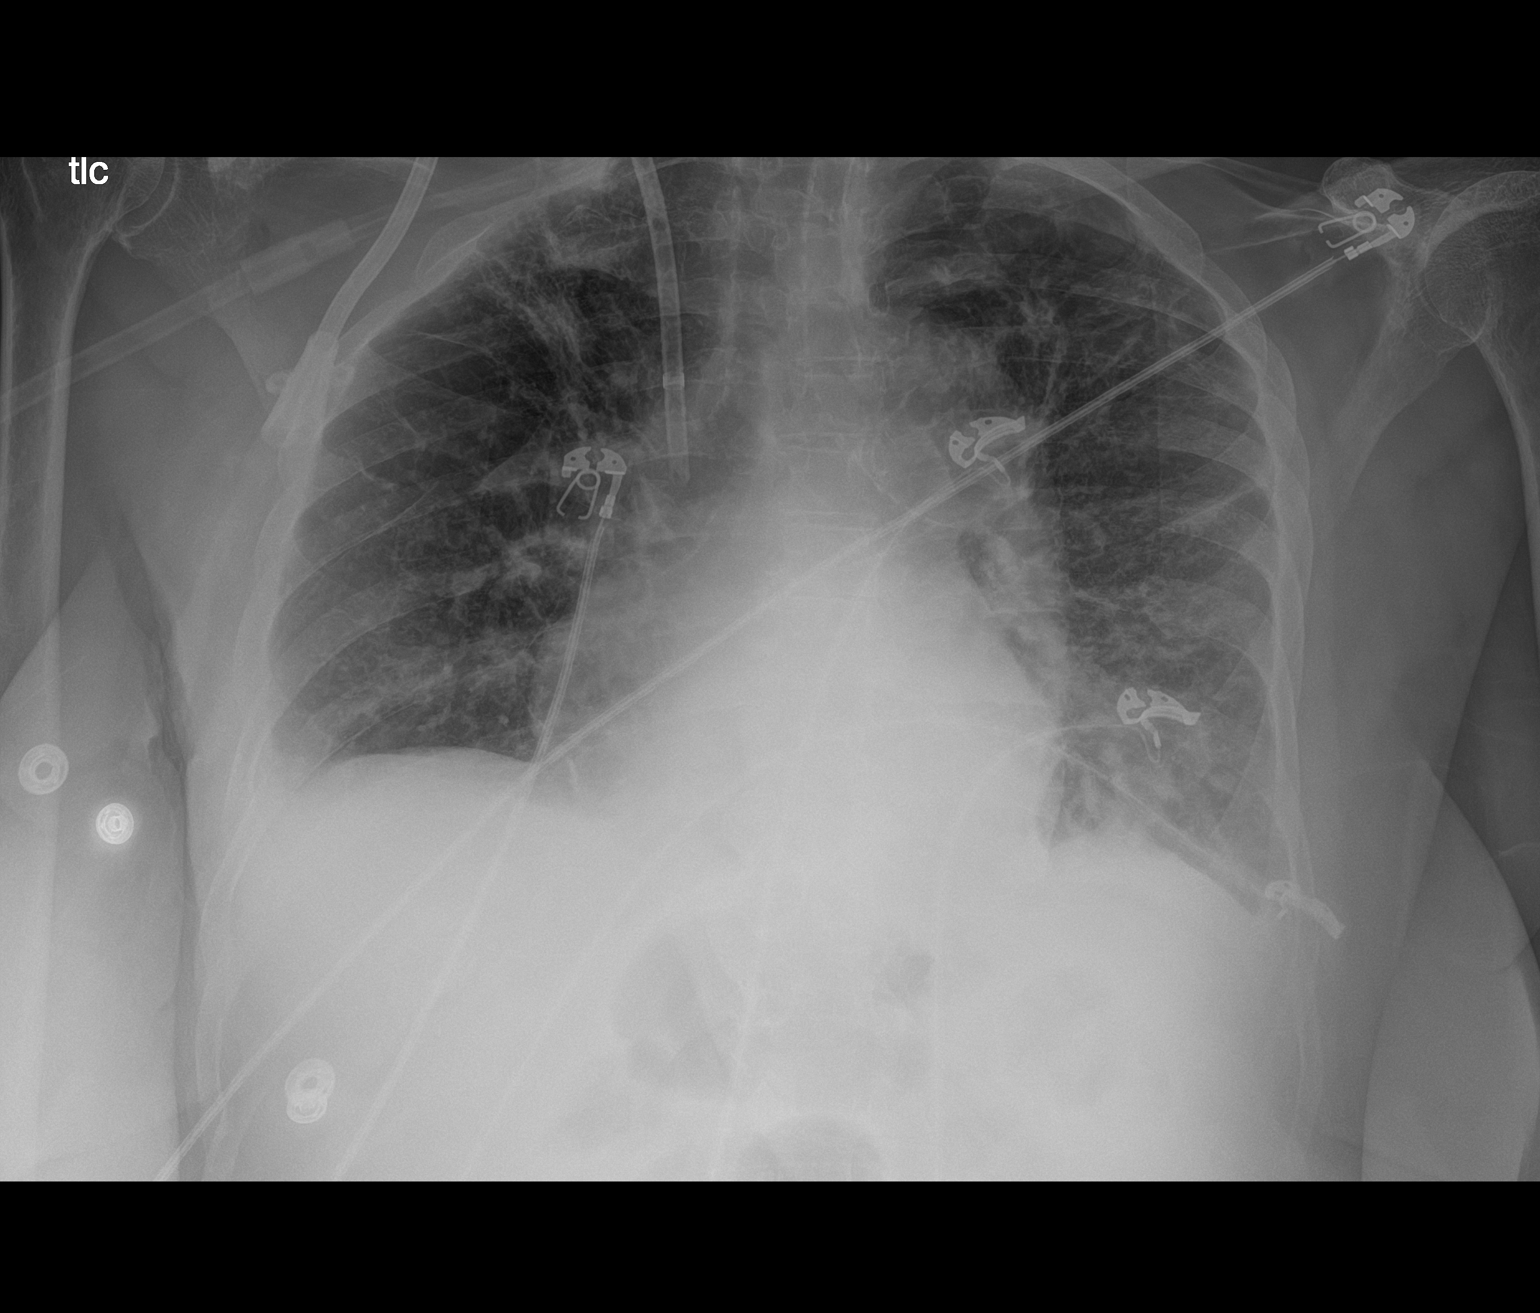

[1 of 1 positions shown; findings below may reference images not displayed]

FINDINGS: There is left basilar airspace disease. There is a trace right
pleural effusion. There is mild interstitial thickening. There is
right apical scarring again noted. There is mild left apical
scarring. There is no pneumothorax. The heart and mediastinal
contours are unremarkable. There is a right jugular central venous
catheter in satisfactory position.

There is no acute osseous abnormality.
IMPRESSION: 1. Hazy left lower lobe airspace disease which may reflect
atelectasis versus pneumonia.
2. Mild interstitial thickening likely reflecting mild interstitial
edema.

## 2018-08-30 MED ORDER — LIDOCAINE-PRILOCAINE 2.5-2.5 % EX CREA: 1.0000 "application " | TOPICAL_CREAM | CUTANEOUS | Status: DC | PRN

## 2018-08-30 MED ORDER — SODIUM CHLORIDE 0.9 % IV SOLN
125.0000 mg | INTRAVENOUS | Status: DC
  Administered 2018-08-30 – 2018-09-03 (×3): 125 mg via INTRAVENOUS
  Filled 2018-08-30 (×5): qty 10

## 2018-08-30 MED ORDER — PENTAFLUOROPROP-TETRAFLUOROETH EX AERO: 1.0000 "application " | INHALATION_SPRAY | CUTANEOUS | Status: DC | PRN

## 2018-08-30 MED ORDER — ATORVASTATIN CALCIUM 10 MG PO TABS
10.0000 mg | ORAL_TABLET | Freq: Every day | ORAL | Status: DC
  Administered 2018-08-31 – 2018-09-03 (×5): 10 mg via ORAL
  Filled 2018-08-30 (×5): qty 1

## 2018-08-30 MED ORDER — ACETAMINOPHEN 325 MG PO TABS
650.0000 mg | ORAL_TABLET | Freq: Four times a day (QID) | ORAL | Status: DC | PRN
  Administered 2018-09-01 – 2018-09-03 (×3): 650 mg via ORAL
  Filled 2018-08-30 (×3): qty 2

## 2018-08-30 MED ORDER — HEPARIN SODIUM (PORCINE) 5000 UNIT/ML IJ SOLN
5000.0000 [IU] | Freq: Three times a day (TID) | INTRAMUSCULAR | Status: DC
  Administered 2018-08-31 – 2018-09-04 (×14): 5000 [IU] via SUBCUTANEOUS
  Filled 2018-08-30 (×15): qty 1

## 2018-08-30 MED ORDER — CHLORHEXIDINE GLUCONATE CLOTH 2 % EX PADS
6.0000 | MEDICATED_PAD | Freq: Every day | CUTANEOUS | Status: DC
  Administered 2018-08-31 – 2018-09-02 (×3): 6 via TOPICAL

## 2018-08-30 MED ORDER — ALTEPLASE 2 MG IJ SOLR: 2.0000 mg | Freq: Once | INTRAMUSCULAR | Status: DC | PRN

## 2018-08-30 MED ORDER — SODIUM CHLORIDE 0.9 % IV SOLN: 100.0000 mL | INTRAVENOUS | Status: DC | PRN

## 2018-08-30 MED ORDER — HEPARIN SODIUM (PORCINE) 1000 UNIT/ML DIALYSIS
1000.0000 [IU] | INTRAMUSCULAR | Status: DC | PRN
  Administered 2018-08-30: 1000 [IU] via INTRAVENOUS_CENTRAL

## 2018-08-30 MED ORDER — HEPARIN SODIUM (PORCINE) 1000 UNIT/ML IJ SOLN
INTRAMUSCULAR | Status: AC
  Administered 2018-08-31: 01:00:00
  Filled 2018-08-30: qty 4

## 2018-08-30 MED ORDER — ALBUTEROL SULFATE HFA 108 (90 BASE) MCG/ACT IN AERS
2.0000 | INHALATION_SPRAY | Freq: Once | RESPIRATORY_TRACT | Status: AC
  Administered 2018-08-30: 2 via RESPIRATORY_TRACT
  Filled 2018-08-30: qty 6.7

## 2018-08-30 MED ORDER — NIFEDIPINE ER OSMOTIC RELEASE 30 MG PO TB24
30.0000 mg | ORAL_TABLET | Freq: Every day | ORAL | Status: DC
  Administered 2018-08-31: 30 mg via ORAL
  Filled 2018-08-30: qty 1

## 2018-08-30 MED ORDER — LIDOCAINE HCL (PF) 1 % IJ SOLN: 5.0000 mL | INTRAMUSCULAR | Status: DC | PRN

## 2018-08-30 MED ORDER — HYDROCODONE-ACETAMINOPHEN 5-325 MG PO TABS
1.0000 | ORAL_TABLET | Freq: Three times a day (TID) | ORAL | Status: DC
  Administered 2018-08-30 – 2018-09-04 (×15): 1 via ORAL
  Filled 2018-08-30 (×15): qty 1

## 2018-08-30 MED ORDER — LABETALOL HCL 200 MG PO TABS
200.0000 mg | ORAL_TABLET | Freq: Two times a day (BID) | ORAL | Status: DC
  Administered 2018-08-31 – 2018-09-04 (×10): 200 mg via ORAL
  Filled 2018-08-30 (×10): qty 1

## 2018-08-30 MED ORDER — POLYETHYLENE GLYCOL 3350 17 G PO PACK: 17.0000 g | PACK | Freq: Every day | ORAL | Status: DC | PRN

## 2018-08-30 MED ORDER — RENA-VITE PO TABS
1.0000 | ORAL_TABLET | Freq: Every day | ORAL | Status: DC
  Administered 2018-08-31 – 2018-09-03 (×5): 1 via ORAL
  Filled 2018-08-30 (×5): qty 1

## 2018-08-30 MED ORDER — ALBUTEROL SULFATE HFA 108 (90 BASE) MCG/ACT IN AERS
4.0000 | INHALATION_SPRAY | Freq: Once | RESPIRATORY_TRACT | Status: AC
  Administered 2018-08-30: 4 via RESPIRATORY_TRACT

## 2018-08-30 NOTE — ED Provider Notes (Signed)
[facility] EMERGENCY DEPARTMENT Provider Note   CSN: [id number] Arrival date & time: 08/30/18  0935    History   Chief Complaint Chief Complaint  Patient presents with   Shortness of Breath    HPI Bianca Myers is a 73 y.o. female with history of CKD now on dialysis Monday Wednesday Friday, diabetes mellitus, anemia, hypertension, secondary hyperparathyroidism presenting for evaluation of progressively worsening shortness of breath.  Recent admission to the hospital on 08/20/2018 discharge on 08/27/2018 after syncopal episode.  She was started on dialysis during this hospital stay and found to be severely anemic with hemoglobin of 5.4 on discharge.  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  Was COVID-19 negative.  She reports that since her discharge she has felt more short of breath, has not been able to get around her home.  Denies syncope, lightheadedness, fever, cough, chest pain, abdominal pain, nausea, vomiting, diarrhea, melena, or hematochezia.  Reports that she was supposed to go to dialysis today and when her transport came to pick her up she was unable to ambulate out of her home.  She is still full code, continues to refuse blood transfusion today.     The history is provided by the patient.    Past Medical History:  Diagnosis Date   Chronic kidney disease (CKD) stage G5/A1, glomerular filtration rate (GFR) less than or equal to 15 mL/min/1.73 square meter and albuminuria creatinine ratio less than 30 mg/g (HCC)    Diabetes mellitus (HCC)    Gout    History of anemia due to chronic kidney disease    History of degenerative disc disease    Hypertension    Secondary hyperparathyroidism ([place])    Spondylisthesis     Patient Active Problem List   Diagnosis Date Noted   DOE (dyspnea on exertion) 08/30/2018   Elevated brain natriuretic peptide (BNP) level 08/30/2018   Anemia of chronic kidney failure, stage 5 ([place])  08/27/2018   Chronic depression 08/27/2018   Patient is Jehovah's Witness 08/27/2018   Essential hypertension    CKD (chronic kidney disease) requiring chronic dialysis ([place])    Anemia    Syncope 08/20/2018   Chronic kidney disease (CKD), stage IV (severe) ([place]) 12/10/2015    Past Surgical History:  Procedure Laterality Date   AV FISTULA PLACEMENT Left 08/24/2018   Procedure: ARTERIOVENOUS (AV) FISTULA CREATION;  Surgeon: Waynetta Sandy, MD;  Location: [facility];  Service: Vascular;  Laterality: Left;   HIP SURGERY     IR FLUORO GUIDE CV LINE RIGHT  08/21/2018   IR US GUIDE VASC ACCESS RIGHT  08/21/2018     OB History   No obstetric history on file.      Home Medications    Prior to Admission medications   Medication Sig Start Date End Date Taking? Authorizing Provider  acetaminophen (TYLENOL) 325 MG tablet Take 650 mg by mouth every 6 (six) hours as needed for headache.   Yes [provider]  Ascorbic Acid (VITAMIN C) 1000 MG tablet Take 1,000 mg by mouth 2 (two) times a day.    Yes [provider]  atorvastatin (LIPITOR) 10 MG tablet Take 10 mg by mouth at bedtime. 05/14/18  Yes [provider]  HYDROcodone-acetaminophen (NORCO/VICODIN) 5-325 MG tablet Take 1 tablet by mouth every 8 (eight) hours. 08/27/18  Yes Dhungel, Nishant, MD  labetalol (NORMODYNE) 200 MG tablet Take 200 mg by mouth 2 (two) times daily.   Yes [provider]  multivitamin (RENA-VIT) TABS tablet Take 1 tablet by mouth at bedtime. 08/27/18  Yes Dhungel, Nishant, MD  NIFEdipine (PROCARDIA-XL/ADALAT CC) 30 MG 24 hr tablet Take 30 mg by mouth at bedtime.    Yes [provider]  Omega-3 Fatty Acids (FISH OIL) 1000 MG CAPS Take by mouth.   Yes [provider]  polyethylene glycol (MIRALAX) 17 g packet Take 17 g by mouth daily as needed for moderate constipation. 08/27/18  Yes Dhungel, Nishant, MD  Turmeric 500 MG CAPS Take 500 mg by mouth daily.     Yes [provider]    Family History Family History  Family history unknown: Yes    Social History Social History   Tobacco Use   Smoking status: Former Smoker    Quit date: 1973    Years since quitting: 47.5   Smokeless tobacco: Never Used  Substance Use Topics   Alcohol use: Not on file   Drug use: Not on file     Allergies   Patient has no known allergies.   Review of Systems Review of Systems  Constitutional: Negative for chills and fever.  Respiratory: Positive for shortness of breath. Negative for cough.   Cardiovascular: Negative for chest pain.  Gastrointestinal: Negative for abdominal pain, blood in stool, diarrhea, nausea and vomiting.  Neurological: Negative for syncope and light-headedness.  All other systems reviewed and are negative.    Physical Exam Updated Vital Signs BP (!) 163/83    Pulse 80    Temp 98.5 F (36.9 C) (Oral)    Resp (!) 22    SpO2 100%   Physical Exam Vitals signs and nursing note reviewed.  Constitutional:      General: She is not in acute distress.    Appearance: She is well-developed.  HENT:     Head: Normocephalic and atraumatic.  Eyes:     General:        Right eye: No discharge.        Left eye: No discharge.     Conjunctiva/sclera: Conjunctivae normal.  Neck:     Vascular: No JVD.     Trachea: No tracheal deviation.  Cardiovascular:     Rate and Rhythm: Regular rhythm. Tachycardia present.     Pulses: Normal pulses.     Comments: AV fistula to the left upper extremity with palpable thrill Pulmonary:     Effort: Tachypnea and respiratory distress present.     Breath sounds: Decreased breath sounds present.     Comments: SPO2 saturations 86% on room air, placed on 2 L via nasal cannula with improvement.  Speaking in short phrases. Globally diminished breath sounds.  Permacath to right chest wall with no surrounding erythema.  Abdominal:     General: There is no distension.     Palpations: Abdomen  is soft. There is no mass.     Tenderness: There is no abdominal tenderness.  Musculoskeletal:     Right lower leg: No edema.     Left lower leg: No edema.  Skin:    General: Skin is warm and dry.     Coloration: Skin is pale.     Findings: No erythema.  Neurological:     Mental Status: She is alert.  Psychiatric:        Behavior: Behavior normal.      ED Treatments / Results  Labs (all labs ordered are listed, but only abnormal results are displayed) Labs Reviewed  BASIC METABOLIC PANEL - Abnormal; Notable  for the following components:      Result Value   CO2 21 (*)    Glucose, Bld 138 (*)    BUN 52 (*)    Creatinine, Ser 9.00 (*)    GFR calc non Af Amer 4 (*)    GFR calc Af Amer 5 (*)    Anion gap 16 (*)    All other components within normal limits  CBC WITH DIFFERENTIAL/PLATELET - Abnormal; Notable for the following components:   RBC 2.33 (*)    Hemoglobin 6.3 (*)    HCT 21.3 (*)    MCHC 29.6 (*)    RDW 16.2 (*)    nRBC 0.4 (*)    Neutro Abs 7.9 (*)    Abs Immature Granulocytes 0.08 (*)    All other components within normal limits  BRAIN NATRIURETIC PEPTIDE - Abnormal; Notable for the following components:   B Natriuretic Peptide 1,864.9 (*)    All other components within normal limits  I-STAT CHEM 8, ED - Abnormal; Notable for the following components:   Sodium 133 (*)    BUN 51 (*)    Creatinine, Ser 9.40 (*)    Glucose, Bld 133 (*)    Calcium, Ion 1.08 (*)    Hemoglobin 7.5 (*)    HCT 22.0 (*)    All other components within normal limits  SARS CORONAVIRUS 2 (HOSPITAL ORDER, [facility])  LACTIC ACID, PLASMA  LACTIC ACID, PLASMA    EKG EKG Interpretation  Date/Time:  Monday August 30 2018 09:54:01 EDT Ventricular Rate:  80 PR Interval:    QRS Duration: 102 QT Interval:  414 QTC Calculation: 478 R Axis:   53 Text Interpretation:  Sinus rhythm Multiple premature complexes, vent & supraven Baseline wander in lead(s) V2  Confirmed by Lennice Sites [phone]) on 08/30/2018 3:04:06 PM   Radiology Dg Chest Port 1 View  Result Date: 08/30/2018 CLINICAL DATA:  Shortness of breath, chest pain, cough EXAM: PORTABLE CHEST 1 VIEW COMPARISON:  08/20/2018 FINDINGS: There is left basilar airspace disease. There is a trace right pleural effusion. There is mild interstitial thickening. There is right apical scarring again noted. There is mild left apical scarring. There is no pneumothorax. The heart and mediastinal contours are unremarkable. There is a right jugular central venous catheter in satisfactory position. There is no acute osseous abnormality. IMPRESSION: 1. Hazy left lower lobe airspace disease which may reflect atelectasis versus pneumonia. 2. Mild interstitial thickening likely reflecting mild interstitial edema. Electronically Signed   By: Kathreen Devoid   On: 08/30/2018 10:29    Procedures Procedures (including critical care time)  Medications Ordered in ED Medications  Chlorhexidine Gluconate Cloth 2 % PADS 6 each (has no administration in time range)  albuterol (VENTOLIN HFA) 108 (90 Base) MCG/ACT inhaler 2 puff (2 puffs Inhalation Given 08/30/18 1024)  albuterol (VENTOLIN HFA) 108 (90 Base) MCG/ACT inhaler 4 puff (4 puffs Inhalation Given 08/30/18 1412)     Initial Impression / Assessment and Plan / ED Course  I have reviewed the triage vital signs and the nursing notes.  Pertinent labs & imaging results that were available during my care of the patient were reviewed by me and considered in my medical decision making (see chart for details).        Patient presenting for evaluation of progressively worsening shortness of breath over the last 3 to 4 days.  Recent admission for syncopal episode where she was started on dialysis and treated  for severe anemia.  In the ED she is afebrile, tachypneic.  Requiring supplemental oxygen for comfort.  Had some improvement in her shortness of breath with albuterol.  Has  globally diminished breath sounds.  Chest x-ray shows hazy left lower lobe airspace disease which could reflect atelectasis versus pneumonia, also shows mild interstitial thickening.  Lab work reviewed by me shows no leukocytosis, anemia improved with hemoglobin 6.3.  Markedly elevated BNP of 1864.9, EKG with no new ischemic changes. Her COVID-19 test is negative today. With no cough, fever, or leukocytosis, doubt bacterial pneumonia. However, I am concerned with her persistently elevated work of breathing and markedly elevated BNP. Differential includes CHF exacerbation, atypical pneumonia, PE. She does not appear septic at this time.   10:52 AM Spoke with Dr. Carolin Sicks with nephrology; her nephrologist is Dr. Justin Mend and he will inform him of the patient's status and disposition.  2:30PM Reassessed patient.  She reports that she had some improvement with albuterol initially but feels short of breath once again.  Spoke with Dr. Jamse Arn with [facility] who agrees to assume care of patient and bring her into the hospital for further evaluation and management.  She requests follow-up with the nephrology service to ensure that the patient is set up for dialysis during admission.  Patient seen and evaluated by Dr. Johnney Killian who agrees with assessment and plan at this time.  3:01 PM Spoke with Dr. Justin Mend with nephrology who states he is aware of patient and will arrange for dialysis while she is admitted in the hospital.    Final Clinical Impressions(s) / ED Diagnoses   Final diagnoses:  SOB (shortness of breath)  Elevated brain natriuretic peptide (BNP) level    ED Discharge Orders    None       Debroah Baller 08/30/18 [place], MD 08/31/18 [phone]

## 2018-08-30 NOTE — ED Notes (Signed)
This RN and tech attempted to ambulate in room. Upon sitting on edge of bed pt became increasingly winded. Pt stood up and remained winded. Pt SpO2 remained 98-100% 2 LPM [place].

## 2018-08-30 NOTE — ED Provider Notes (Signed)
Medical screening examination/treatment/procedure(s) were conducted as a shared visit with non-physician practitioner(s) and myself.  I personally evaluated the patient during the encounter.    It was recently admitted to the hospital and discharged after syncopal episode.  She is on dialysis and severely anemic.  Does not accept blood transfusion due to being Jehovah's Witness.  She reports she has had increasing shortness of breath since her discharge.  Patient is alert and appropriate.  Moderate increased work of breathing.  Patient very pale.  Cardia.  Breath sounds are symmetric.  No crackle.  Breath sounds are soft bilaterally.  No peripheral edema.  Calves are soft and nontender.  Agree with plan of management.   Charlesetta Shanks, MD 08/31/18 [phone]

## 2018-08-30 NOTE — ED Triage Notes (Signed)
Pt comes from home. Pt complaining of shortness of breath and weakness since Friday after being discharged from the hospital. Pt is dialysis pt and was supposed to be dialyzed today but was too weak to go to dialysis.

## 2018-08-30 NOTE — ED Notes (Addendum)
ED TO INPATIENT HANDOFF REPORT  ED Nurse Name and Phone #: Thurmond [facility]ts [place] Name/Age/Gender Bianca Myers 73 y.o. female Room/Bed: 019C/019C  Code Status   Code Status: Prior  Home/SNF/Other Home Patient oriented to: self, place, time and situation Is this baseline? Yes   Triage Complete: Triage complete  Chief Complaint shob/gen weakness  Triage Note Pt comes from home. Pt complaining of shortness of breath and weakness since Friday after being discharged from the hospital. Pt is dialysis pt and was supposed to be dialyzed today but was too weak to go to dialysis.   Allergies No Known Allergies  Level of Care/Admitting Diagnosis ED Disposition    ED Disposition Condition [facility] Area: [place] [100100]  Level of Care: Telemetry Medical [104]  I expect the patient will be discharged within 24 hours: Yes  LOW acuity---Tx typically complete <24 hrs---ACUTE conditions typically can be evaluated <24 hours---LABS likely to return to acceptable levels <24 hours---IS near functional baseline---EXPECTED to return to current living arrangement---NOT newly hypoxic: Meets criteria for 5C-Observation unit  Covid Evaluation: Screening Protocol (No Symptoms)  Diagnosis: Anemia, chronic renal failure, stage 5 [facility]) [[id number]]  Admitting Physician: Vashti Hey [[id number]]  Attending Physician: Vashti Hey [[id number]]  PT Class (Do Not Modify): Observation [104]  PT Acc Code (Do Not Modify): Observation [10022]       B Medical/Surgery History Past Medical History:  Diagnosis Date  . Chronic kidney disease (CKD) stage G5/A1, glomerular filtration rate (GFR) less than or equal to 15 mL/min/1.73 square meter and albuminuria creatinine ratio less than 30 mg/g (HCC)   . Diabetes mellitus ([place])   . Gout   . History of anemia due to chronic kidney disease   . History of degenerative disc disease   . Hypertension   .  Secondary hyperparathyroidism ([place])   . Spondylisthesis    Past Surgical History:  Procedure Laterality Date  . AV FISTULA PLACEMENT Left 08/24/2018   Procedure: ARTERIOVENOUS (AV) FISTULA CREATION;  Surgeon: Waynetta Sandy, MD;  Location: [facility];  Service: Vascular;  Laterality: Left;  . HIP SURGERY    . IR FLUORO GUIDE CV LINE RIGHT  08/21/2018  . IR US GUIDE VASC ACCESS RIGHT  08/21/2018     A IV Location/Drains/Wounds Patient Lines/Drains/Airways Status   Active Line/Drains/Airways    Name:   Placement date:   Placement time:   Site:   Days:   Peripheral IV 08/30/18 Right Antecubital   08/30/18    1025    Antecubital   less than 1   Fistula / Graft Left Upper arm Arteriovenous fistula   08/24/18    1041    Upper arm   6   Hemodialysis Catheter Right Internal jugular Double lumen Permanent (Tunneled)   08/21/18    1541    Internal jugular   9   Incision (Closed) 08/24/18 Arm Left   08/24/18    1030     6          Intake/Output Last 24 hours No intake or output data in the 24 hours ending 08/30/18 1550  Labs/Imaging Results for orders placed or performed during the hospital encounter of 08/30/18 (from the past 48 hour(s))  SARS Coronavirus 2 (CEPHEID- Performed in [facility] hospital lab), Hosp Order     Status: None   Collection Time: 08/30/18 10:05 AM   Specimen: Nasopharyngeal Swab  Result Value Ref Range   SARS Coronavirus  2 NEGATIVE NEGATIVE    Comment: (NOTE) If result is NEGATIVE SARS-CoV-2 target nucleic acids are NOT DETECTED. The SARS-CoV-2 RNA is generally detectable in upper and lower  respiratory specimens during the acute phase of infection. The lowest  concentration of SARS-CoV-2 viral copies this assay can detect is 250  copies / mL. A negative result does not preclude SARS-CoV-2 infection  and should not be used as the sole basis for treatment or other  patient management decisions.  A negative result may occur with  improper specimen collection  / handling, submission of specimen other  than nasopharyngeal swab, presence of viral mutation(s) within the  areas targeted by this assay, and inadequate number of viral copies  (<250 copies / mL). A negative result must be combined with clinical  observations, patient history, and epidemiological information. If result is POSITIVE SARS-CoV-2 target nucleic acids are DETECTED. The SARS-CoV-2 RNA is generally detectable in upper and lower  respiratory specimens dur ing the acute phase of infection.  Positive  results are indicative of active infection with SARS-CoV-2.  Clinical  correlation with patient history and other diagnostic information is  necessary to determine patient infection status.  Positive results do  not rule out bacterial infection or co-infection with other viruses. If result is PRESUMPTIVE POSTIVE SARS-CoV-2 nucleic acids MAY BE PRESENT.   A presumptive positive result was obtained on the submitted specimen  and confirmed on repeat testing.  While 2019 novel coronavirus  (SARS-CoV-2) nucleic acids may be present in the submitted sample  additional confirmatory testing may be necessary for epidemiological  and / or clinical management purposes  to differentiate between  SARS-CoV-2 and other Sarbecovirus currently known to infect humans.  If clinically indicated additional testing with an alternate test  methodology [phone]) is advised. The SARS-CoV-2 RNA is generally  detectable in upper and lower respiratory sp ecimens during the acute  phase of infection. The expected result is Negative. Fact Sheet for Patients:  StrictlyIdeas.no Fact Sheet for Healthcare Providers: BankingDealers.co.za This test is not yet approved or cleared by the [place] FDA and has been authorized for detection and/or diagnosis of SARS-CoV-2 by FDA under an Emergency Use Authorization (EUA).  This EUA will remain in effect (meaning this  test can be used) for the duration of the COVID-19 declaration under Section 564(b)(1) of the Act, 21 U.S.C. section 360bbb-3(b)(1), unless the authorization is terminated or revoked sooner. Performed at [facility], [place] [address], [place], [place] [address]   Basic metabolic panel     Status: Abnormal   Collection Time: 08/30/18 10:05 AM  Result Value Ref Range   Sodium 136 135 - 145 mmol/L   Potassium 4.6 3.5 - 5.1 mmol/L   Chloride 99 98 - 111 mmol/L   CO2 21 (L) 22 - 32 mmol/L   Glucose, Bld 138 (H) 70 - 99 mg/dL   BUN 52 (H) 8 - 23 mg/dL   Creatinine, Ser 9.00 (H) 0.44 - 1.00 mg/dL   Calcium 8.9 8.9 - 10.3 mg/dL   GFR calc non Af Amer 4 (L) >60 mL/min   GFR calc Af Amer 5 (L) >60 mL/min   Anion gap 16 (H) 5 - 15    Comment: Performed at [facility], [place] [address], [place], [place] [address]  CBC with Differential     Status: Abnormal   Collection Time: 08/30/18 10:05 AM  Result Value Ref Range   WBC 10.4 4.0 - 10.5 K/uL  RBC 2.33 (L) 3.87 - 5.11 MIL/uL   Hemoglobin 6.3 (LL) 12.0 - 15.0 g/dL    Comment: REPEATED TO VERIFY THIS CRITICAL RESULT HAS VERIFIED AND BEEN CALLED TO R.Keni Wafer,RN BY IMANI MANNING ON 06 29 2020 AT 1054, AND HAS BEEN READ BACK.     HCT 21.3 (L) 36.0 - 46.0 %   MCV 91.4 80.0 - 100.0 fL   MCH 27.0 26.0 - 34.0 pg   MCHC 29.6 (L) 30.0 - 36.0 g/dL   RDW 16.2 (H) 11.5 - 15.5 %   Platelets 246 150 - 400 K/uL   nRBC 0.4 (H) 0.0 - 0.2 %   Neutrophils Relative % 75 %   Neutro Abs 7.9 (H) 1.7 - 7.7 K/uL   Lymphocytes Relative 12 %   Lymphs Abs 1.2 0.7 - 4.0 K/uL   Monocytes Relative 8 %   Monocytes Absolute 0.9 0.1 - 1.0 K/uL   Eosinophils Relative 4 %   Eosinophils Absolute 0.4 0.0 - 0.5 K/uL   Basophils Relative 0 %   Basophils Absolute 0.0 0.0 - 0.1 K/uL   Immature Granulocytes 1 %   Abs Immature Granulocytes 0.08 (H) 0.00 - 0.07 K/uL    Comment: Performed at [facility] [address] [place], [place] [address]  Brain  natriuretic peptide     Status: Abnormal   Collection Time: 08/30/18 10:05 AM  Result Value Ref Range   B Natriuretic Peptide 1,864.9 (H) 0.0 - 100.0 pg/mL    Comment: Performed at [facility] [address], [place], [place] [address]  I-stat chem 8, ED (not at [facility] or [facility])     Status: Abnormal   Collection Time: 08/30/18 10:47 AM  Result Value Ref Range   Sodium 133 (L) 135 - 145 mmol/L   Potassium 4.4 3.5 - 5.1 mmol/L   Chloride 99 98 - 111 mmol/L   BUN 51 (H) 8 - 23 mg/dL   Creatinine, Ser 9.40 (H) 0.44 - 1.00 mg/dL   Glucose, Bld 133 (H) 70 - 99 mg/dL   Calcium, Ion 1.08 (L) 1.15 - 1.40 mmol/L   TCO2 24 22 - 32 mmol/L   Hemoglobin 7.5 (L) 12.0 - 15.0 g/dL   HCT 22.0 (L) 36.0 - 46.0 %  Lactic acid, plasma     Status: None   Collection Time: 08/30/18 11:20 AM  Result Value Ref Range   Lactic Acid, Venous 1.4 0.5 - 1.9 mmol/L    Comment: Performed at [facility], [address], [place], [place] [address]   Dg Chest Port 1 View  Result Date: 08/30/2018 CLINICAL DATA:  Shortness of breath, chest pain, cough EXAM: PORTABLE CHEST 1 VIEW COMPARISON:  08/20/2018 FINDINGS: There is left basilar airspace disease. There is a trace right pleural effusion. There is mild interstitial thickening. There is right apical scarring again noted. There is mild left apical scarring. There is no pneumothorax. The heart and mediastinal contours are unremarkable. There is a right jugular central venous catheter in satisfactory position. There is no acute osseous abnormality. IMPRESSION: 1. Hazy left lower lobe airspace disease which may reflect atelectasis versus pneumonia. 2. Mild interstitial thickening likely reflecting mild interstitial edema. Electronically Signed   By: Kathreen Devoid   On: 08/30/2018 10:29    Pending Labs Unresulted Labs (From admission, onward)    Start     Ordered   08/30/18 1117  Lactic acid, plasma  Now then every 2 hours,   STAT  08/30/18 1116   Signed and  Held  Renal function panel  Once,   R     Signed and Held   Signed and Held  CBC  Once,   R     Signed and Held   Signed and Held  CBC  (heparin)  Once,   R    Comments: Baseline for heparin therapy IF NOT ALREADY DRAWN.  Notify MD if PLT < 100 K.    Signed and Held   Signed and Held  Creatinine, serum  (heparin)  Once,   R    Comments: Baseline for heparin therapy IF NOT ALREADY DRAWN.    Signed and Held   Signed and Held  Basic metabolic panel  Tomorrow morning,   R     Signed and Held   Signed and Held  CBC  Tomorrow morning,   R     Signed and Held          Vitals/Pain Today's Vitals   08/30/18 0947 08/30/18 1007 08/30/18 1303 08/30/18 1415  BP: (!) 163/83   (!) 163/92  Pulse: 80   79  Resp: (!) 22   (!) 30  Temp: 98.5 F (36.9 C)     TempSrc: Oral     SpO2: 100% 100%  100%  PainSc:   0-No pain     Isolation Precautions No active isolations  Medications Medications  Chlorhexidine Gluconate Cloth 2 % PADS 6 each (has no administration in time range)  albuterol (VENTOLIN HFA) 108 (90 Base) MCG/ACT inhaler 2 puff (2 puffs Inhalation Given 08/30/18 1024)  albuterol (VENTOLIN HFA) 108 (90 Base) MCG/ACT inhaler 4 puff (4 puffs Inhalation Given 08/30/18 1412)    Mobility walks with person assist Moderate fall risk   Focused Assessments    R Recommendations: See Admitting Provider Note  Report given to: Angela Nevin, 86M RN  Additional Notes:

## 2018-08-30 NOTE — Progress Notes (Addendum)
  [facility] Progress Note    Background: New ESRD patient to start hemodialysis today after being discharged from hospital 08/27/2018 with HGB 5.4. Patient is Jehovah Witness and refuses transfusion. Per patient, she developed progressive weakness and SOB over weekend. She got up this AM with intent to go to [facility] office to speak with Dr. Justin Mend because she felt so poorly. Apparently she fell at home twice and was unable to get on transportation bus to go to hemodialysis. She was brought to ED instead.   Subjective: Patient C/O of being SOB and hungry.    Objective Vitals:   08/30/18 1500 08/30/18 1515 08/30/18 1530 08/30/18 1545  BP: (!) 194/113 (!) 197/111 (!) 191/107 (!) 204/110  Pulse: 79 78 74 76  Resp: 17 20 18  (!) 25  Temp:      TempSrc:      SpO2: 90% 98% 99% 100%   Physical Exam General: Chronically ill appearing female in NAD Heart: S1,S2 RRR SR on monitor with PACs and PVCs. JVD 1/3 to mandible.  Lungs: Bilateral breath sounds with bibasilar crackles, few upper airway end expiratory wheezes.  Abdomen: Active BS Neuro: Alert, oriented X 3. Having jerking motions of  UE.  Extremities: Bilateral LE edema L>R Dialysis Access: Maturing LUA AVF + bruit. RIJ TDC drsg intact   Additional Objective Labs: Basic Metabolic Panel: Recent Labs  Lab 08/25/18 0755 08/27/18 0714 08/30/18 1005 08/30/18 1047  NA 136 134* 136 133*  K 3.9 3.7 4.6 4.4  CL 102 97* 99 99  CO2 24 23 21*  --   GLUCOSE 103* 109* 138* 133*  BUN 43* 35* 52* 51*  CREATININE 9.24* 7.99* 9.00* 9.40*  CALCIUM 7.7* 8.0* 8.9  --   PHOS 4.8* 3.9  --   --    Liver Function Tests: Recent Labs  Lab 08/25/18 0755 08/27/18 0714  ALBUMIN 2.4* 2.4*   No results for input(s): LIPASE, AMYLASE in the last 168 hours. CBC: Recent Labs  Lab 08/24/18 0542 08/25/18 0755 08/27/18 0713 08/30/18 1005 08/30/18 1047  WBC 7.9 10.1 14.3* 10.4  --   NEUTROABS  --   --   --  7.9*  --   HGB  5.9* 5.7* 5.4* 6.3* 7.5*  HCT 19.7* 19.0* 18.3* 21.3* 22.0*  MCV 88.3 90.9 90.6 91.4  --   PLT 214 193 165 246  --    Blood Culture No results found for: SDES, SPECREQUEST, CULT, REPTSTATUS  Cardiac Enzymes: No results for input(s): CKTOTAL, CKMB, CKMBINDEX, TROPONINI in the last 168 hours. CBG: Recent Labs  Lab 08/24/18 1112  GLUCAP 123*   Iron Studies: No results for input(s): IRON, TIBC, TRANSFERRIN, FERRITIN in the last 72 hours. @lablastinr3 @ Studies/Results: Dg Chest Port 1 View  Result Date: 08/30/2018 CLINICAL DATA:  Shortness of breath, chest pain, cough EXAM: PORTABLE CHEST 1 VIEW COMPARISON:  08/20/2018 FINDINGS: There is left basilar airspace disease. There is a trace right pleural effusion. There is mild interstitial thickening. There is right apical scarring again noted. There is mild left apical scarring. There is no pneumothorax. The heart and mediastinal contours are unremarkable. There is a right jugular central venous catheter in satisfactory position. There is no acute osseous abnormality. IMPRESSION: 1. Hazy left lower lobe airspace disease which may reflect atelectasis versus pneumonia. 2. Mild interstitial thickening likely reflecting mild interstitial edema. Electronically Signed   By: Kathreen Devoid   On: 08/30/2018 10:29   Medications:  . [START ON 08/31/2018] Chlorhexidine Gluconate Cloth  6 each Topical Q0600   HD orders: [facility] MWF 4 hr 180 NRe 400/800 81.5 kg 3.0 K/2.25 Ca -No heparin  -Venofer 100 mg IV X 4 (not started yet) -Venofer 100 mg IV weekly (not started yet)  Assessment/Plan: 1. DOE-probably symptomatic anemia. HGB 6.4 on arrival which is actually improved from discharge date 08/27/18. Patient adamantly refuses transfusion. Continue iron load. Follow HGB 2. Volume Overload-last HD 08/27/2018.  CXR with mild interstitial thickening likely mild interstitial edema. + peripheral edema. HD ASAP today.  2. ESRD -MWF at [facility]. Did not go to HD unit  today D/T weakness.  3. Anemia - HGB improved to 6.3. Continue Iron load. Last Aranesp dose 08/27/18.  4. Secondary hyperparathyroidism -  5. HTN/volume - Attempt UFG 2-3 liters today. Very hypertensive-resume home meds.  6. Nutrition - Renal diet with fld restrictions. Prostat, renal vits.     H.  NP-C 08/30/2018, 4:19 PM  [employer] [phone]

## 2018-08-30 NOTE — H&P (Signed)
History and Physical:    Bianca Myers   ZOX:[id number] DOB: 1945/06/05 DOA: 08/30/2018  Referring MD/provider: PA Nils Flack PCP: [name], MD   Patient coming from: Home  Chief Complaint: Weakness and shortness of breath without improvement since last admission  History of Present Illness:   Bianca Myers is an 73 y.o. female with past medical history significant for stage V renal failure who was initiated on dialysis last week last hospitalization stay, hypertension and significant anemia who presents with weakness and dyspnea.  Patient states that she feels as poorly today as she was upon discharge 3 days ago.  Patient states when she got home she was unable to get about because she was so tired and short of breath.  Patient states she comes in today because she is no better.   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  Fecal occult blood testing was negative at last admission.  She is pursuing alternate forms of treatment of anemia including IV iron and Aranesp which she has received at last admission.  Patient does not feel that her shortness of breath is improved with dialysis.  Patient denies any recent intercurrent illness since discharge on 6/26.  Denies any fevers or chills.  No new cough.  Patient denies tobacco use.  Patient denies chest pain or pressure.  Patient does note that she has difficulty going to sleep but she is unsure why she is unable to sleep.  Patient denies any acute new anxiety.  Denies orthopnea or PND per se.  Did not received albuterol nebulizer in the ED but does not think it helped her very much.  ED Course:  The patient was noted to be short of breath with minimal exertion.  It is unclear whether she has a new oxygen requirement as she was immediately placed on oxygen when she came in.  Patient is short of breath without desaturation even taking a few steps.  Was thought by the ED staff that she may have had some  improvement in her symptoms with inhaled bronchodilators however patient refutes this.  ROS:   ROS   Review of Systems: Per HPI  Past Medical History:   Past Medical History:  Diagnosis Date  . Chronic kidney disease (CKD) stage G5/A1, glomerular filtration rate (GFR) less than or equal to 15 mL/min/1.73 square meter and albuminuria creatinine ratio less than 30 mg/g (HCC)   . Diabetes mellitus ([place])   . Gout   . History of anemia due to chronic kidney disease   . History of degenerative disc disease   . Hypertension   . Secondary hyperparathyroidism ([place])   . Spondylisthesis     Past Surgical History:   Past Surgical History:  Procedure Laterality Date  . AV FISTULA PLACEMENT Left 08/24/2018   Procedure: ARTERIOVENOUS (AV) FISTULA CREATION;  Surgeon: Waynetta Sandy, MD;  Location: [facility];  Service: Vascular;  Laterality: Left;  . HIP SURGERY    . IR FLUORO GUIDE CV LINE RIGHT  08/21/2018  . IR US GUIDE VASC ACCESS RIGHT  08/21/2018    Social History:   Social History   Socioeconomic History  . Marital status: Widowed    Spouse name: Not on file  . Number of children: Not on file  . Years of education: Not on file  . Highest education level: Not on file  Occupational History  . Not on file  Social Needs  . Financial resource strain: Not on file  .  Food insecurity    Worry: Not on file    Inability: Not on file  . Transportation needs    Medical: Not on file    Non-medical: Not on file  Tobacco Use  . Smoking status: Former Smoker    Quit date: 1973    Years since quitting: 47.5  . Smokeless tobacco: Never Used  Substance and Sexual Activity  . Alcohol use: Not on file  . Drug use: Not on file  . Sexual activity: Not on file  Lifestyle  . Physical activity    Days per week: Not on file    Minutes per session: Not on file  . Stress: Not on file  Relationships  . Social Herbalist on phone: Not on file    Gets together: Not on file     Attends religious service: Not on file    Active member of club or organization: Not on file    Attends meetings of clubs or organizations: Not on file    Relationship status: Not on file  . Intimate partner violence    Fear of current or ex partner: Not on file    Emotionally abused: Not on file    Physically abused: Not on file    Forced sexual activity: Not on file  Other Topics Concern  . Not on file  Social History Narrative  . Not on file    Allergies   Patient has no known allergies.  Family history:   Family History  Family history unknown: Yes    Current Medications:   Prior to Admission medications   Medication Sig Start Date End Date Taking? Authorizing Provider  acetaminophen (TYLENOL) 325 MG tablet Take 650 mg by mouth every 6 (six) hours as needed for headache.   Yes [provider]  Ascorbic Acid (VITAMIN C) 1000 MG tablet Take 1,000 mg by mouth 2 (two) times a day.    Yes [provider]  atorvastatin (LIPITOR) 10 MG tablet Take 10 mg by mouth at bedtime. 05/14/18  Yes [provider]  HYDROcodone-acetaminophen (NORCO/VICODIN) 5-325 MG tablet Take 1 tablet by mouth every 8 (eight) hours. 08/27/18  Yes Dhungel, Nishant, MD  labetalol (NORMODYNE) 200 MG tablet Take 200 mg by mouth 2 (two) times daily.   Yes [provider]  multivitamin (RENA-VIT) TABS tablet Take 1 tablet by mouth at bedtime. 08/27/18  Yes Dhungel, Nishant, MD  NIFEdipine (PROCARDIA-XL/ADALAT CC) 30 MG 24 hr tablet Take 30 mg by mouth at bedtime.    Yes [provider]  Omega-3 Fatty Acids (FISH OIL) 1000 MG CAPS Take by mouth.   Yes [provider]  polyethylene glycol (MIRALAX) 17 g packet Take 17 g by mouth daily as needed for moderate constipation. 08/27/18  Yes Dhungel, Nishant, MD  Turmeric 500 MG CAPS Take 500 mg by mouth daily.    Yes [provider]    Physical Exam:   Vitals:   08/30/18 0945 08/30/18 0947 08/30/18 1007  08/30/18 1415  BP:  (!) 163/83  (!) 163/92  Pulse:  80  79  Resp:  (!) 22  (!) 30  Temp:  98.5 F (36.9 C)    TempSrc:  Oral    SpO2: 98% 100% 100% 100%     Physical Exam: Blood pressure (!) 163/92, pulse 79, temperature 98.5 F (36.9 C), temperature source Oral, resp. rate (!) 30, SpO2 100 %. Gen: Pale tired appearing female with proptosis and disconjugate gaze sitting  up in stretcher at 40 degrees without tachypnea or labored breathing.  She is able to speak in full sentences without difficulty. Eyes: Sclerae anicteric.  Conjunctiva are pale bilaterally. Chest: Moderately good air entry bilaterally with few rales at bases. CV: Distant, regular, no audible murmurs. Abdomen: NABS, soft, nondistended, nontender. No tenderness to light or deep palpation. No rebound, no guarding. Extremities: No edema.  Neuro: Alert and oriented times 3; grossly nonfocal. Psych: Patient is cooperative.   Data Review:    Labs: Basic Metabolic Panel: Recent Labs  Lab 08/24/18 0542 08/25/18 0755 08/27/18 0714 08/30/18 1005 08/30/18 1047  NA 137 136 134* 136 133*  K 3.4* 3.9 3.7 4.6 4.4  CL 102 102 97* 99 99  CO2 25 24 23  21*  --   GLUCOSE 104* 103* 109* 138* 133*  BUN 37* 43* 35* 52* 51*  CREATININE 6.82* 9.24* 7.99* 9.00* 9.40*  CALCIUM 7.7* 7.7* 8.0* 8.9  --   PHOS  --  4.8* 3.9  --   --    Liver Function Tests: Recent Labs  Lab 08/25/18 0755 08/27/18 0714  ALBUMIN 2.4* 2.4*   No results for input(s): LIPASE, AMYLASE in the last 168 hours. No results for input(s): AMMONIA in the last 168 hours. CBC: Recent Labs  Lab 08/24/18 0542 08/25/18 0755 08/27/18 0713 08/30/18 1005 08/30/18 1047  WBC 7.9 10.1 14.3* 10.4  --   NEUTROABS  --   --   --  7.9*  --   HGB 5.9* 5.7* 5.4* 6.3* 7.5*  HCT 19.7* 19.0* 18.3* 21.3* 22.0*  MCV 88.3 90.9 90.6 91.4  --   PLT 214 193 165 246  --    Cardiac Enzymes: No results for input(s): CKTOTAL, CKMB, CKMBINDEX, TROPONINI in the last 168  hours.  BNP (last 3 results) No results for input(s): PROBNP in the last 8760 hours. CBG: Recent Labs  Lab 08/24/18 1112  GLUCAP 123*    Urinalysis    Component Value Date/Time   COLORURINE YELLOW 08/20/2018 2040   APPEARANCEUR HAZY (A) 08/20/2018 2040   LABSPEC 1.012 08/20/2018 2040   PHURINE 5.0 08/20/2018 2040   GLUCOSEU NEGATIVE 08/20/2018 2040   HGBUR SMALL (A) 08/20/2018 2040   BILIRUBINUR NEGATIVE 08/20/2018 2040   KETONESUR NEGATIVE 08/20/2018 2040   PROTEINUR 100 (A) 08/20/2018 2040   NITRITE NEGATIVE 08/20/2018 2040   LEUKOCYTESUR LARGE (A) 08/20/2018 2040      Radiographic Studies: Dg Chest Port 1 View  Result Date: 08/30/2018 CLINICAL DATA:  Shortness of breath, chest pain, cough EXAM: PORTABLE CHEST 1 VIEW COMPARISON:  08/20/2018 FINDINGS: There is left basilar airspace disease. There is a trace right pleural effusion. There is mild interstitial thickening. There is right apical scarring again noted. There is mild left apical scarring. There is no pneumothorax. The heart and mediastinal contours are unremarkable. There is a right jugular central venous catheter in satisfactory position. There is no acute osseous abnormality. IMPRESSION: 1. Hazy left lower lobe airspace disease which may reflect atelectasis versus pneumonia. 2. Mild interstitial thickening likely reflecting mild interstitial edema. Electronically Signed   By: Kathreen Devoid   On: 08/30/2018 10:29    EKG: Independently reviewed.  Sinus rhythm at 80.  Frequent PVCs, occasional PAC.  Normal intervals.  Normal axis.  No acute ST-T wave changes.   Assessment/Plan:   Principal Problem:   DOE (dyspnea on exertion) Active Problems:   CKD (chronic kidney disease) requiring chronic dialysis (HCC)   Anemia of chronic kidney failure,  stage 5 (HCC)   Chronic depression   Patient is Jehovah's Witness   Essential hypertension   Elevated brain natriuretic peptide (BNP) level   Anemia, chronic renal  failure, stage 16 (Bianca Myers)  73 year old female [place] who presents who recently initiated dialysis last week now presents with persistent and ongoing shortness of breath at rest, weakness and dyspnea on exertion.  Patient does not appear to be hypoxic on examination.  Differential diagnosis includes symptomatic anemia (which is most likely diagnosis), fluid overload and or left ventricular dysfunction/strain.  DOE Patient most likely has symptomatic anemia however patient declines transfusion for this. Elevated BNP of unclear etiology, will order echocardiogram She is due for hemodialysis today which she apparently will receive today per ED discussions with renal. I have discussed with patient that if she does have symptomatic anemia there is not much further that we can offer her without blood transfusion.  Patient states she feels certain that we will come up with a solution for her.  ANEMIA As noted above patient declines blood transfusion Continue IV iron and Aranesp as previously initiated FOBT has been negative in the past although she is iron deficient. Further work-up with colonoscopy as an outpatient as warranted.  HTN She did not take her antihypertensives this morning and she is also due for dialysis Patient is to get dialyzed today Continue labetalol and nifedipine   DISPOSITION Is unclear to me if patient is going to be able to be functional at home. Request physical therapy to see patient to assess best disposition for patient.  ANXIETY AND DEPRESSION Patient had previously been on Cymbalta while she was in house however this does not seem to be on her medicine reconciliation from home.  He is can be restarted as warranted per discussion with patient.  HL Patient also does not appear to be on a statin on medicine reconciliation.      Other information:   DVT prophylaxis: Heparin ordered. Code Status: Full code. Family Communication: States her family knows  she is here.   Disposition Plan: TBD.  It is unclear if patient can go home alone. Consults called: Renal Admission status: Observation  The medical decision making on this patient was of high complexity and the patient is at high risk for clinical deterioration, therefore this is a level 3 visit.  Dewaine Oats Tublu Latroy Gaymon [facility]  If 7PM-7AM, please contact night-coverage [email or web address] Password [facility] 08/30/2018, 3:29 PM

## 2018-08-31 ENCOUNTER — Observation Stay (HOSPITAL_COMMUNITY): Payer: Medicare HMO

## 2018-08-31 DIAGNOSIS — I160 Hypertensive urgency: Secondary | ICD-10-CM | POA: Diagnosis not present

## 2018-08-31 DIAGNOSIS — E1122 Type 2 diabetes mellitus with diabetic chronic kidney disease: Secondary | ICD-10-CM | POA: Diagnosis present

## 2018-08-31 DIAGNOSIS — I132 Hypertensive heart and chronic kidney disease with heart failure and with stage 5 chronic kidney disease, or end stage renal disease: Secondary | ICD-10-CM | POA: Diagnosis present

## 2018-08-31 DIAGNOSIS — N2581 Secondary hyperparathyroidism of renal origin: Secondary | ICD-10-CM | POA: Diagnosis present

## 2018-08-31 DIAGNOSIS — R0609 Other forms of dyspnea: Secondary | ICD-10-CM | POA: Diagnosis not present

## 2018-08-31 DIAGNOSIS — I509 Heart failure, unspecified: Secondary | ICD-10-CM | POA: Diagnosis present

## 2018-08-31 DIAGNOSIS — Z20828 Contact with and (suspected) exposure to other viral communicable diseases: Secondary | ICD-10-CM | POA: Diagnosis not present

## 2018-08-31 DIAGNOSIS — R0602 Shortness of breath: Secondary | ICD-10-CM | POA: Diagnosis present

## 2018-08-31 DIAGNOSIS — F419 Anxiety disorder, unspecified: Secondary | ICD-10-CM | POA: Diagnosis present

## 2018-08-31 DIAGNOSIS — D631 Anemia in chronic kidney disease: Secondary | ICD-10-CM | POA: Diagnosis present

## 2018-08-31 DIAGNOSIS — J9691 Respiratory failure, unspecified with hypoxia: Secondary | ICD-10-CM | POA: Diagnosis not present

## 2018-08-31 DIAGNOSIS — E611 Iron deficiency: Secondary | ICD-10-CM | POA: Diagnosis present

## 2018-08-31 DIAGNOSIS — Z531 Procedure and treatment not carried out because of patient's decision for reasons of belief and group pressure: Secondary | ICD-10-CM | POA: Diagnosis present

## 2018-08-31 DIAGNOSIS — Z992 Dependence on renal dialysis: Secondary | ICD-10-CM | POA: Diagnosis not present

## 2018-08-31 DIAGNOSIS — Z79891 Long term (current) use of opiate analgesic: Secondary | ICD-10-CM | POA: Diagnosis not present

## 2018-08-31 DIAGNOSIS — N186 End stage renal disease: Secondary | ICD-10-CM | POA: Diagnosis present

## 2018-08-31 DIAGNOSIS — Z87891 Personal history of nicotine dependence: Secondary | ICD-10-CM | POA: Diagnosis not present

## 2018-08-31 DIAGNOSIS — Z79899 Other long term (current) drug therapy: Secondary | ICD-10-CM | POA: Diagnosis not present

## 2018-08-31 DIAGNOSIS — F329 Major depressive disorder, single episode, unspecified: Secondary | ICD-10-CM | POA: Diagnosis present

## 2018-08-31 DIAGNOSIS — Z9181 History of falling: Secondary | ICD-10-CM | POA: Diagnosis not present

## 2018-08-31 LAB — CBC
HCT: 19.8 % — ABNORMAL LOW (ref 36.0–46.0)
Hemoglobin: 6 g/dL — CL (ref 12.0–15.0)
MCH: 26.9 pg (ref 26.0–34.0)
MCHC: 30.3 g/dL (ref 30.0–36.0)
MCV: 88.8 fL (ref 80.0–100.0)
Platelets: 220 10*3/uL (ref 150–400)
RBC: 2.23 MIL/uL — ABNORMAL LOW (ref 3.87–5.11)
RDW: 16.1 % — ABNORMAL HIGH (ref 11.5–15.5)
WBC: 9.1 10*3/uL (ref 4.0–10.5)
nRBC: 0.3 % — ABNORMAL HIGH (ref 0.0–0.2)

## 2018-08-31 LAB — BASIC METABOLIC PANEL
Anion gap: 12 (ref 5–15)
BUN: 15 mg/dL (ref 8–23)
CO2: 26 mmol/L (ref 22–32)
Calcium: 8.1 mg/dL — ABNORMAL LOW (ref 8.9–10.3)
Chloride: 97 mmol/L — ABNORMAL LOW (ref 98–111)
Creatinine, Ser: 4.46 mg/dL — ABNORMAL HIGH (ref 0.44–1.00)
GFR calc Af Amer: 11 mL/min — ABNORMAL LOW (ref >60)
GFR calc non Af Amer: 9 mL/min — ABNORMAL LOW (ref >60)
Glucose, Bld: 106 mg/dL — ABNORMAL HIGH (ref 70–99)
Potassium: 3.4 mmol/L — ABNORMAL LOW (ref 3.5–5.1)
Sodium: 135 mmol/L (ref 135–145)

## 2018-08-31 LAB — GLUCOSE, CAPILLARY
Glucose-Capillary: 109 mg/dL — ABNORMAL HIGH (ref 70–99)
Glucose-Capillary: 123 mg/dL — ABNORMAL HIGH (ref 70–99)
Glucose-Capillary: 134 mg/dL — ABNORMAL HIGH (ref 70–99)
Glucose-Capillary: 97 mg/dL (ref 70–99)

## 2018-08-31 IMAGING — CR CHEST - 2 VIEW
2 series · 2 of 2 positions shown · non-contrast
Comparison: [DATE]

CLINICAL DATA: Pneumonia. Chronic kidney disease. Diabetes.

EXAM:
CHEST - 2 VIEW

[chest lat]
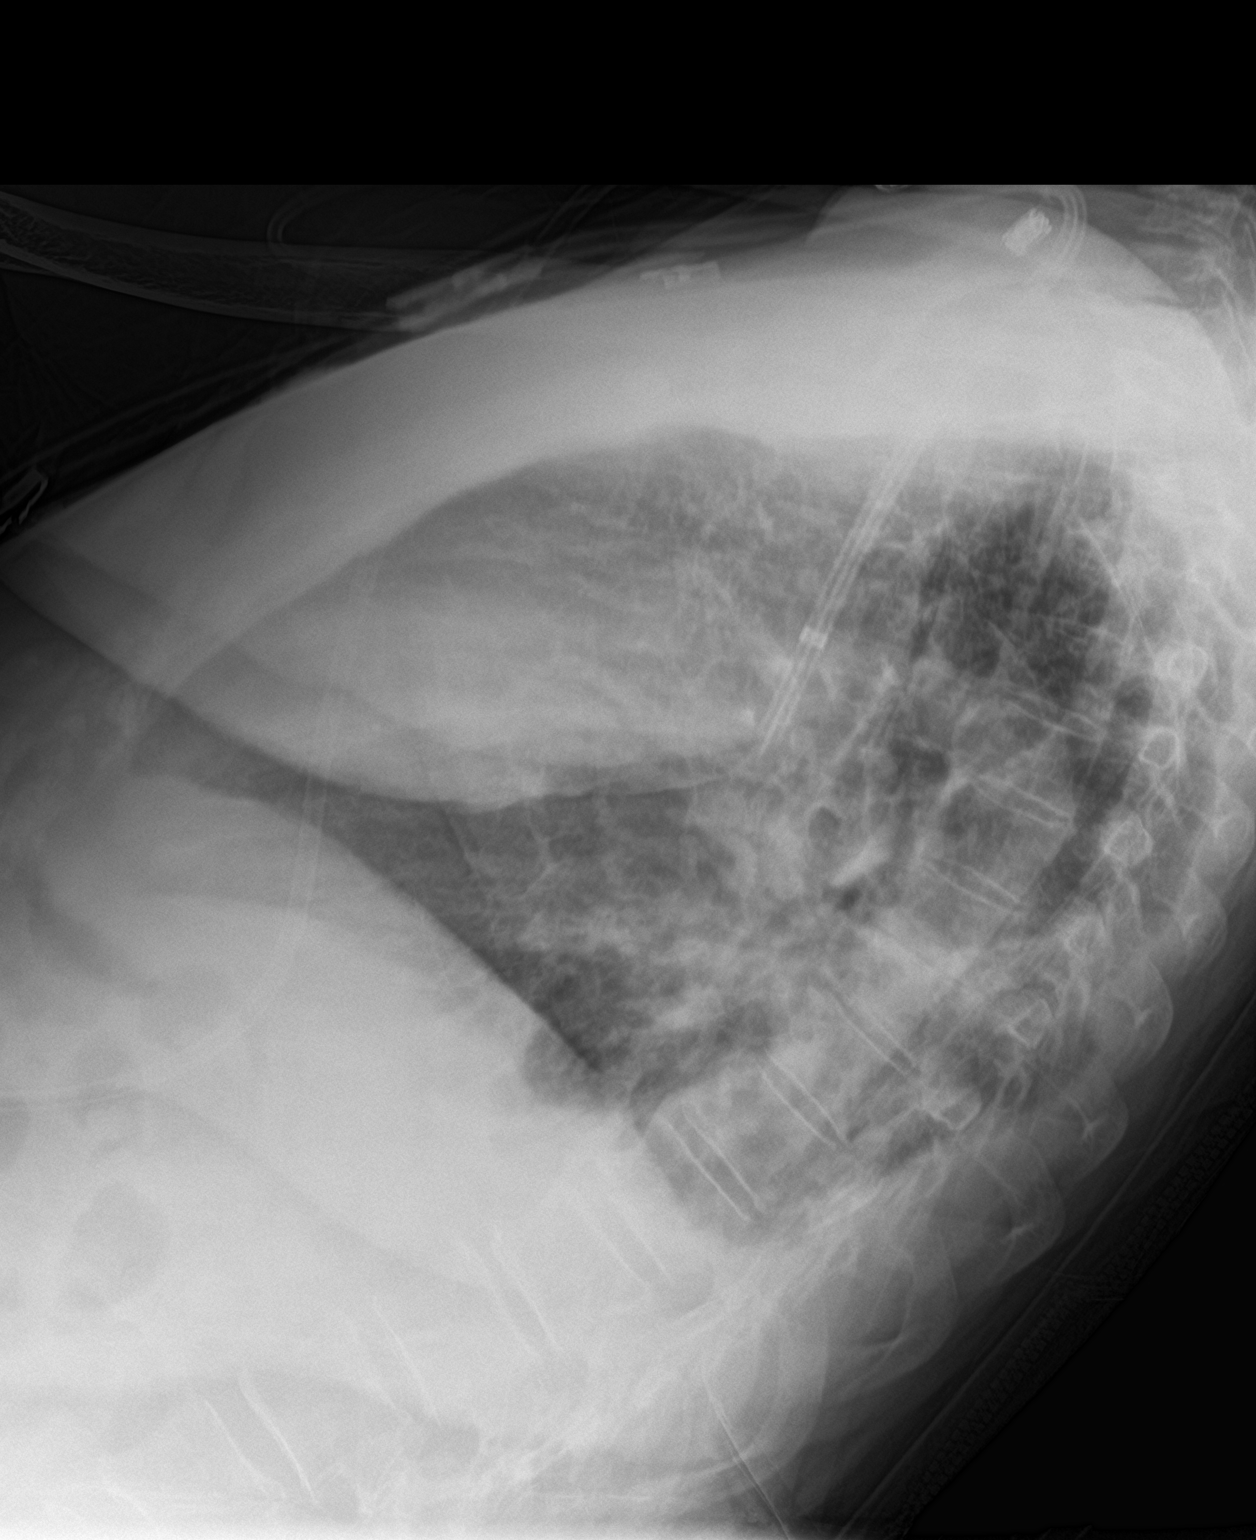

[chest ap]
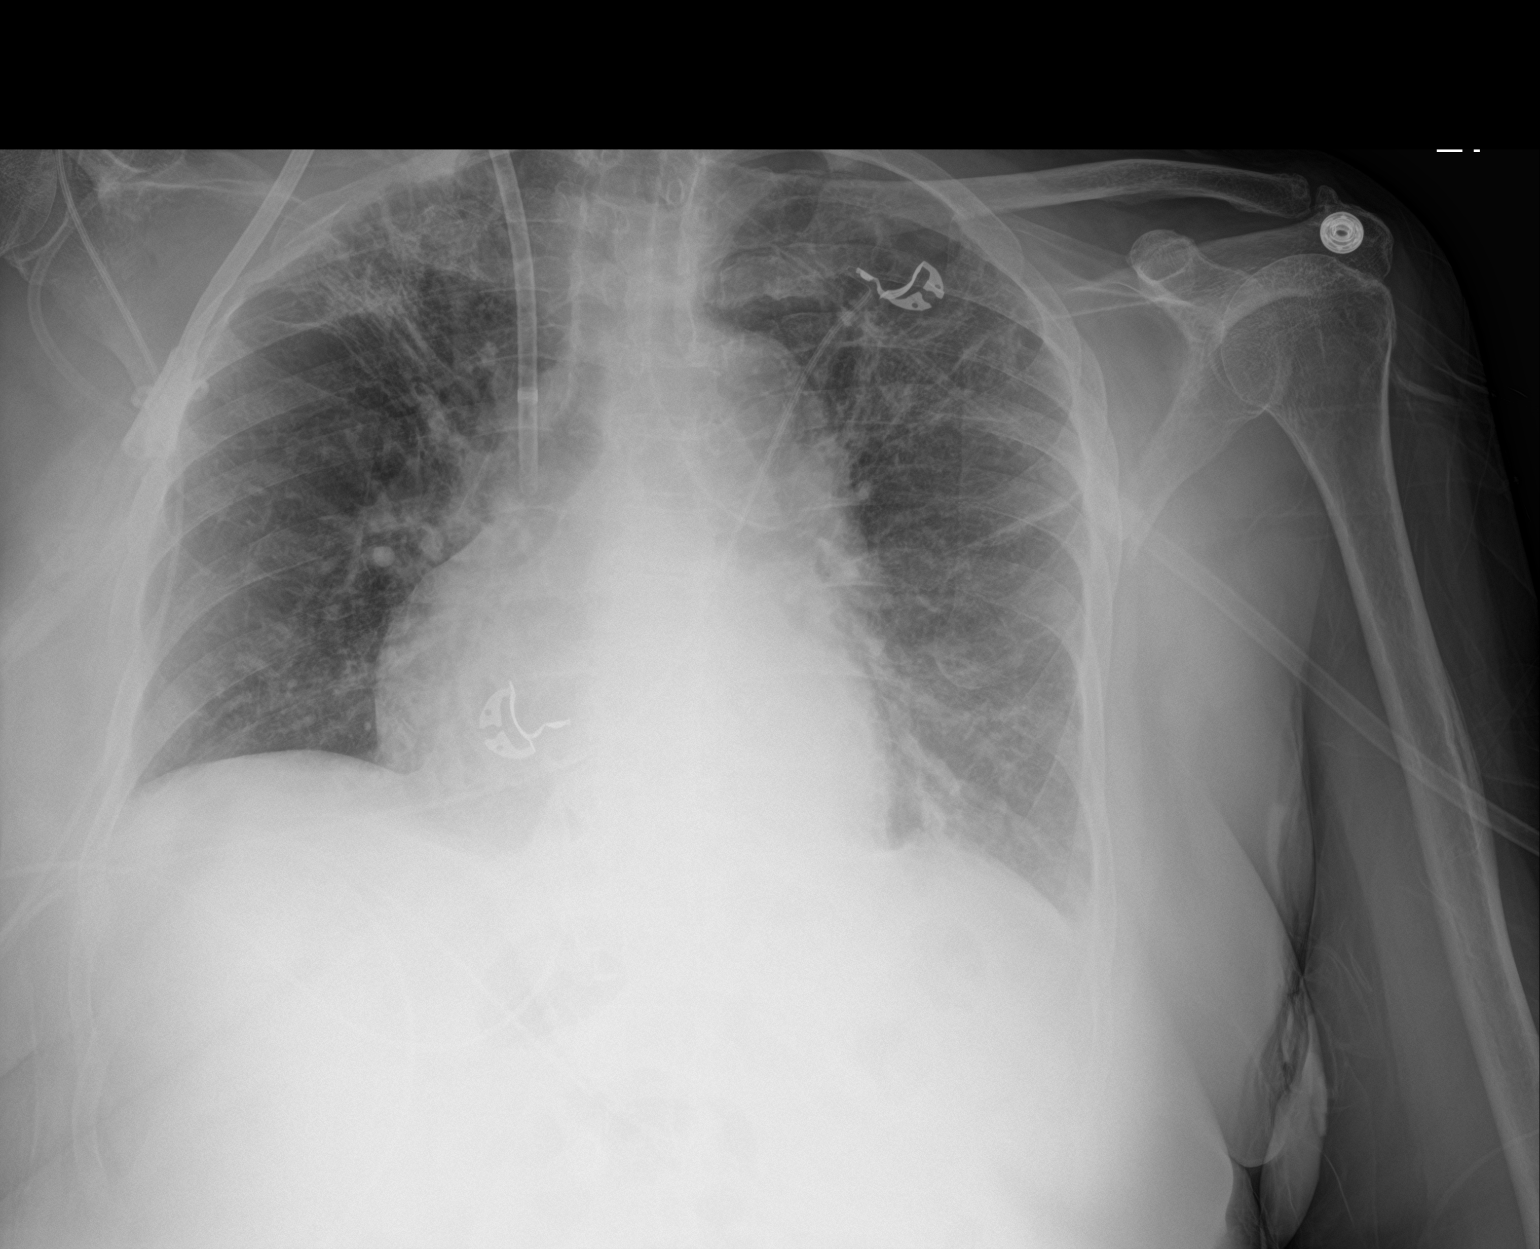

[2 of 2 positions shown; findings below may reference images not displayed]

FINDINGS: The heart size and mediastinal contours are within normal limits.
Right jugular dual-lumen central venous catheter remains in
appropriate position. Atelectasis or infiltrate in posterior left
lower lobe shows no significant change. Stable right upper lobe
scarring. Probable small left pleural effusion noted on lateral
view.
IMPRESSION: No significant change in left lower lobe atelectasis versus
infiltrate, and probable small left pleural effusion.

## 2018-08-31 MED ORDER — NIFEDIPINE ER OSMOTIC RELEASE 60 MG PO TB24
60.0000 mg | ORAL_TABLET | Freq: Every day | ORAL | Status: DC
  Administered 2018-08-31 – 2018-09-03 (×4): 60 mg via ORAL
  Filled 2018-08-31 (×4): qty 1

## 2018-08-31 MED ORDER — FUROSEMIDE 40 MG PO TABS
40.0000 mg | ORAL_TABLET | ORAL | Status: DC
  Administered 2018-08-31 – 2018-09-04 (×3): 40 mg via ORAL
  Filled 2018-08-31 (×3): qty 1

## 2018-08-31 NOTE — Progress Notes (Signed)
  Echocardiogram 2D Echocardiogram has been performed.  Bianca Myers 08/31/2018, 3:40 PM

## 2018-08-31 NOTE — Progress Notes (Signed)
[facility] Progress Note   Subjective: Feels much better today.   Objective Vitals:   08/30/18 2300 08/30/18 2305 08/30/18 2344 08/31/18 0549  BP: (!) 154/69 (!) 168/77 (!) 168/83 (!) 158/89  Pulse: 84 84 (!) 106 70  Resp:  17 (!) 21 19  Temp:  98.9 F (37.2 C) 98.4 F (36.9 C) 97.9 F (36.6 C)  TempSrc:  Oral Oral Oral  SpO2:  98% 100% 100%  Weight:  80.1 kg 79.3 kg    Physical Exam General: Pleasant, alert, No C/Os. NAD Heart: S1,S2 RRR SR on monitor with PACs and PVCs. JVD 1/3 to mandible.  Lungs: CTAB A/P Abdomen: Active BS Neuro: Alert, oriented X 3. No further jerking motions of  UE.  Extremities: trace BLE edema-much improved.  Dialysis Access: Maturing LUA AVF + bruit. RIJ TDC drsg intact   Additional Objective Labs: Basic Metabolic Panel: Recent Labs  Lab 08/25/18 0755 08/27/18 0714 08/30/18 1005 08/30/18 1047 08/31/18 0603  NA 136 134* 136 133* 135  K 3.9 3.7 4.6 4.4 3.4*  CL 102 97* 99 99 97*  CO2 24 23 21*  --  26  GLUCOSE 103* 109* 138* 133* 106*  BUN 43* 35* 52* 51* 15  CREATININE 9.24* 7.99* 9.00* 9.40* 4.46*  CALCIUM 7.7* 8.0* 8.9  --  8.1*  PHOS 4.8* 3.9  --   --   --    Liver Function Tests: Recent Labs  Lab 08/25/18 0755 08/27/18 0714  ALBUMIN 2.4* 2.4*   No results for input(s): LIPASE, AMYLASE in the last 168 hours. CBC: Recent Labs  Lab 08/25/18 0755 08/27/18 0713 08/30/18 1005 08/30/18 1047 08/31/18 0603  WBC 10.1 14.3* 10.4  --  9.1  NEUTROABS  --   --  7.9*  --   --   HGB 5.7* 5.4* 6.3* 7.5* 6.0*  HCT 19.0* 18.3* 21.3* 22.0* 19.8*  MCV 90.9 90.6 91.4  --  88.8  PLT 193 165 246  --  220   Blood Culture No results found for: SDES, SPECREQUEST, CULT, REPTSTATUS  Cardiac Enzymes: No results for input(s): CKTOTAL, CKMB, CKMBINDEX, TROPONINI in the last 168 hours. CBG: Recent Labs  Lab 08/24/18 1112 08/30/18 1650 08/30/18 2345 08/31/18 0649  GLUCAP 123* 107* 126* 109*   Iron Studies: No results  for input(s): IRON, TIBC, TRANSFERRIN, FERRITIN in the last 72 hours. @lablastinr3 @ Studies/Results: Dg Chest 2 View  Result Date: 08/31/2018 CLINICAL DATA:  Pneumonia. Chronic kidney disease. Diabetes. EXAM: CHEST - 2 VIEW COMPARISON:  08/30/2018 FINDINGS: The heart size and mediastinal contours are within normal limits. Right jugular dual-lumen central venous catheter remains in appropriate position. Atelectasis or infiltrate in posterior left lower lobe shows no significant change. Stable right upper lobe scarring. Probable small left pleural effusion noted on lateral view. IMPRESSION: No significant change in left lower lobe atelectasis versus infiltrate, and probable small left pleural effusion. Electronically Signed   By: Marlaine Hind M.D.   On: 08/31/2018 08:48   Dg Chest Port 1 View  Result Date: 08/30/2018 CLINICAL DATA:  Shortness of breath, chest pain, cough EXAM: PORTABLE CHEST 1 VIEW COMPARISON:  08/20/2018 FINDINGS: There is left basilar airspace disease. There is a trace right pleural effusion. There is mild interstitial thickening. There is right apical scarring again noted. There is mild left apical scarring. There is no pneumothorax. The heart and mediastinal contours are unremarkable. There is a right jugular central venous catheter in satisfactory position. There is no acute osseous abnormality. IMPRESSION:  1. Hazy left lower lobe airspace disease which may reflect atelectasis versus pneumonia. 2. Mild interstitial thickening likely reflecting mild interstitial edema. Electronically Signed   By: Kathreen Devoid   On: 08/30/2018 10:29   Medications: . ferric gluconate (FERRLECIT/NULECIT) IV Stopped (08/31/18 0125)   . atorvastatin  10 mg Oral QHS  . Chlorhexidine Gluconate Cloth  6 each Topical Q0600  . heparin  5,000 Units Subcutaneous Q8H  . HYDROcodone-acetaminophen  1 tablet Oral Q8H  . labetalol  200 mg Oral BID  . multivitamin  1 tablet Oral QHS  . NIFEdipine  30 mg Oral QHS      HD orders: [facility] MWF 4 hr 180 NRe 400/800 81.5 kg 3.0 K/2.25 Ca -No heparin  -Venofer 100 mg IV X 4 (not started yet) -Venofer 100 mg IV weekly (not started yet)  Assessment/Plan: 1. DOE-probably symptomatic anemia. HGB 6.4 on arrival which is actually improved from discharge date 08/27/18. Patient adamantly refuses transfusion. Continue iron load. Follow HGB. HGB 6.0 today.  2. Volume Overload-HD 08/30/18 Net UF 2.1 liters post wt 79.3. Lower EDW on Dc.  2. ESRD -MWF at [facility]. HD tomorrow on schedule 4.0 K+ bath. K+ 3.4 3. Anemia - HGB improved to 6.3. Continue Iron load. Last Aranesp dose 08/27/18.  4. Secondary hyperparathyroidism -  5. HTN/volume - As noted above. Still hypertensive but improved. Increase nifedipine to 60 mg PO daily. Continue furosemide 40 mg on non HD days. Continue lowering volume as tolerated.  6. Nutrition - Renal diet with fld restrictions. Prostat, renal vits.    Rita H. Brown NP-C 08/31/2018, 9:25 AM  [employer] [phone]

## 2018-08-31 NOTE — Plan of Care (Signed)
  Problem: Pain Managment: Goal: General experience of comfort will improve Outcome: Progressing   

## 2018-08-31 NOTE — Progress Notes (Addendum)
[facility] PROGRESS NOTE  Bianca Myers QIW:[id number] DOB: 26-Feb-1946 DOA: 08/30/2018 PCP: [name], MD  P Volume overload DDX iatrogenic versus heart failure versus high output from low blood count CXR this a.m. 2 view shows infiltrate/edema left >right side No fever, no cough-we will treat as edema- Obtain echocardiogram Given hypertensive emergency on admission feel this is unlikely to be infectious Desat screen indicated-does not use oxygen at home ESRD Secondary hyperparathyroidism Renally mediated in addition to Jehovah's Witness anemia Aranesp/iron/vitamin D/binders as per nephrology Appreciate input Appears to be dosing Lasix TTS S daily HTN Labetalol 200 twice daily + addition of Procardia 60 at bedtime Jehovah's Witness The patient is at high risk for further decompensation given the fact that she does not accept blood I am still waiting on echocardiogram to ensure that she does not have a valvular issue although I do not hear any murmur I think she will still need another 24 hours to ensure that she is stable for discharge home as she was just discharged on 6/26 --- Synopsis [name] Jehovah's Witness-does not accept blood-baseline hemoglobin 5-6 range Recent MWF [place] ESRD, dialysis right renal catheter chest-left immature fistula-missed dialysis 6/26- Admit 6/29 DOE X1 week-she tells me she does not know what her EDW is and this has not been clarified given her recent start of dialysis Emergently dialyzed 3 L 6/29-nephrology consulted  DVT Heparin Code Status: Full CODE STATUS Communication: No family present Disposition Plan: Will need clearance from nephrology regarding planning for discharge in addition to EDW determination-expect will be here at least 62 hours   [name], MD  [facility] Via [place] [email or web address] 7PM-7AM contact night coverage as above 08/31/2018, 2:29 PM  LOS: 0 days   ---  Consultants:  Nephrology  Procedures:  Echocardiogram is pending  Antimicrobials:  None --- Today Awake alert feels much better overall seems to be breathing fair Relates no sputum no cough but does feel somewhat short of breath when lying flat and has felt that way over the past week to week and a half No sick contacts, no fever Has never been told anything about heart failure and does not have a cardiologist  O  Vitals:  Vitals:   08/31/18 0549 08/31/18 0949  BP: (!) 158/89 (!) 141/63  Pulse: 70 72  Resp: 19 18  Temp: 97.9 F (36.6 C) 98.7 F (37.1 C)  SpO2: 100% 99%    Exam:  Pleasant African-American female looking younger than stated age No icterus no pallor Chest clear no egophony no fremitus Right chest access, good thrill in left AV fistula Abdomen soft No lower extremity edema Neurologically intact moving all 4 limbs equally   I have personally reviewed the following:   DATA Potassium down to 3.4, BUN/creatinine 15/4.4, bicarb 26 gap 12 Hemoglobin 6.0 white count no left shift   Scheduled Meds: . atorvastatin  10 mg Oral QHS  . Chlorhexidine Gluconate Cloth  6 each Topical Q0600  . furosemide  40 mg Oral Q T,Th,S,Su  . heparin  5,000 Units Subcutaneous Q8H  . HYDROcodone-acetaminophen  1 tablet Oral Q8H  . labetalol  200 mg Oral BID  . multivitamin  1 tablet Oral QHS  . NIFEdipine  60 mg Oral QHS   Continuous Infusions: . 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

## 2018-08-31 NOTE — Progress Notes (Signed)
Pt refusing bed alarm. Educated on Engineer, materials by Therapist, sports. RN will continue to monitor.

## 2018-09-01 LAB — ECHOCARDIOGRAM COMPLETE: Weight: 2798.4 oz

## 2018-09-01 LAB — CBC WITH DIFFERENTIAL/PLATELET
Abs Immature Granulocytes: 0.1 10*3/uL — ABNORMAL HIGH (ref 0.00–0.07)
Basophils Absolute: 0.1 10*3/uL (ref 0.0–0.1)
Basophils Relative: 1 %
Eosinophils Absolute: 0.5 10*3/uL (ref 0.0–0.5)
Eosinophils Relative: 5 %
HCT: 19.6 % — ABNORMAL LOW (ref 36.0–46.0)
Hemoglobin: 5.8 g/dL — CL (ref 12.0–15.0)
Lymphocytes Relative: 13 %
Lymphs Abs: 1.3 10*3/uL (ref 0.7–4.0)
MCH: 26.6 pg (ref 26.0–34.0)
MCHC: 29.6 g/dL — ABNORMAL LOW (ref 30.0–36.0)
MCV: 89.9 fL (ref 80.0–100.0)
Metamyelocytes Relative: 1 %
Monocytes Absolute: 0.7 10*3/uL (ref 0.1–1.0)
Monocytes Relative: 7 %
Neutro Abs: 7.4 10*3/uL (ref 1.7–7.7)
Neutrophils Relative %: 73 %
Platelets: 224 10*3/uL (ref 150–400)
RBC: 2.18 MIL/uL — ABNORMAL LOW (ref 3.87–5.11)
RDW: 16.4 % — ABNORMAL HIGH (ref 11.5–15.5)
WBC: 10.1 10*3/uL (ref 4.0–10.5)
nRBC: 0.3 % — ABNORMAL HIGH (ref 0.0–0.2)
nRBC: 7 /100 WBC — ABNORMAL HIGH

## 2018-09-01 LAB — RENAL FUNCTION PANEL
Albumin: 2.3 g/dL — ABNORMAL LOW (ref 3.5–5.0)
Anion gap: 16 — ABNORMAL HIGH (ref 5–15)
BUN: 29 mg/dL — ABNORMAL HIGH (ref 8–23)
CO2: 25 mmol/L (ref 22–32)
Calcium: 8.3 mg/dL — ABNORMAL LOW (ref 8.9–10.3)
Chloride: 97 mmol/L — ABNORMAL LOW (ref 98–111)
Creatinine, Ser: 6.57 mg/dL — ABNORMAL HIGH (ref 0.44–1.00)
GFR calc Af Amer: 7 mL/min — ABNORMAL LOW (ref >60)
GFR calc non Af Amer: 6 mL/min — ABNORMAL LOW (ref >60)
Glucose, Bld: 96 mg/dL (ref 70–99)
Phosphorus: 3 mg/dL (ref 2.5–4.6)
Potassium: 3.3 mmol/L — ABNORMAL LOW (ref 3.5–5.1)
Sodium: 138 mmol/L (ref 135–145)

## 2018-09-01 LAB — PATHOLOGIST SMEAR REVIEW

## 2018-09-01 LAB — GLUCOSE, CAPILLARY: Glucose-Capillary: 122 mg/dL — ABNORMAL HIGH (ref 70–99)

## 2018-09-01 NOTE — Progress Notes (Signed)
Patient on phone with family.  Stated she would update them on her care.

## 2018-09-01 NOTE — Progress Notes (Signed)
[facility] Progress Note   Subjective: Seen on HD, asking for something for anxiety prior to HD. Otherwise seems to be feeling better.   Objective Vitals:   09/01/18 0506 09/01/18 0705 09/01/18 0710 09/01/18 0730  BP: (!) 160/74 (!) 144/75 133/74 (!) 166/65  Pulse: 68 70 67 70  Resp: 18 18 17 16   Temp: 98.7 F (37.1 C) 98.4 F (36.9 C)    TempSrc: Oral Oral    SpO2: 96% 98%    Weight:  93.1 kg     Physical Exam General: Chronically ill appearing female in NAD Heart: S1,S2 RRR  Lungs: CTAB Abdomen: Active BS Neuro: Alert, oriented X 3.  Extremities: Trace bilateral LE edema L>R Dialysis Access: Maturing LUA AVF + bruit. RIJ TDC drsg intact, blood lines connected   Additional Objective Labs: Basic Metabolic Panel: Recent Labs  Lab 08/27/18 0714 08/30/18 1005 08/30/18 1047 08/31/18 0603 09/01/18 0539  NA 134* 136 133* 135 138  K 3.7 4.6 4.4 3.4* 3.3*  CL 97* 99 99 97* 97*  CO2 23 21*  --  26 25  GLUCOSE 109* 138* 133* 106* 96  BUN 35* 52* 51* 15 29*  CREATININE 7.99* 9.00* 9.40* 4.46* 6.57*  CALCIUM 8.0* 8.9  --  8.1* 8.3*  PHOS 3.9  --   --   --  3.0   Liver Function Tests: Recent Labs  Lab 08/27/18 0714 09/01/18 0539  ALBUMIN 2.4* 2.3*   No results for input(s): LIPASE, AMYLASE in the last 168 hours. CBC: Recent Labs  Lab 08/27/18 0713 08/30/18 1005 08/30/18 1047 08/31/18 0603 09/01/18 0539  WBC 14.3* 10.4  --  9.1 10.1  NEUTROABS  --  7.9*  --   --  7.4  HGB 5.4* 6.3* 7.5* 6.0* 5.8*  HCT 18.3* 21.3* 22.0* 19.8* 19.6*  MCV 90.6 91.4  --  88.8 89.9  PLT 165 246  --  220 224   Blood Culture No results found for: SDES, SPECREQUEST, CULT, REPTSTATUS  Cardiac Enzymes: No results for input(s): CKTOTAL, CKMB, CKMBINDEX, TROPONINI in the last 168 hours. CBG: Recent Labs  Lab 08/30/18 2345 08/31/18 0649 08/31/18 1142 08/31/18 1636 08/31/18 2104  GLUCAP 126* 109* 123* 134* 97   Iron Studies: No results for input(s): IRON,  TIBC, TRANSFERRIN, FERRITIN in the last 72 hours. @lablastinr3 @ Studies/Results: Dg Chest 2 View  Result Date: 08/31/2018 CLINICAL DATA:  Pneumonia. Chronic kidney disease. Diabetes. EXAM: CHEST - 2 VIEW COMPARISON:  08/30/2018 FINDINGS: The heart size and mediastinal contours are within normal limits. Right jugular dual-lumen central venous catheter remains in appropriate position. Atelectasis or infiltrate in posterior left lower lobe shows no significant change. Stable right upper lobe scarring. Probable small left pleural effusion noted on lateral view. IMPRESSION: No significant change in left lower lobe atelectasis versus infiltrate, and probable small left pleural effusion. Electronically Signed   By: Marlaine Hind M.D.   On: 08/31/2018 08:48   Dg Chest Port 1 View  Result Date: 08/30/2018 CLINICAL DATA:  Shortness of breath, chest pain, cough EXAM: PORTABLE CHEST 1 VIEW COMPARISON:  08/20/2018 FINDINGS: There is left basilar airspace disease. There is a trace right pleural effusion. There is mild interstitial thickening. There is right apical scarring again noted. There is mild left apical scarring. There is no pneumothorax. The heart and mediastinal contours are unremarkable. There is a right jugular central venous catheter in satisfactory position. There is no acute osseous abnormality. IMPRESSION: 1. Hazy left lower lobe airspace disease which  may reflect atelectasis versus pneumonia. 2. Mild interstitial thickening likely reflecting mild interstitial edema. Electronically Signed   By: Kathreen Devoid   On: 08/30/2018 10:29   Medications: . ferric gluconate (FERRLECIT/NULECIT) IV Stopped (08/31/18 0125)   . atorvastatin  10 mg Oral QHS  . Chlorhexidine Gluconate Cloth  6 each Topical Q0600  . furosemide  40 mg Oral Q T,Th,S,Su  . heparin  5,000 Units Subcutaneous Q8H  . HYDROcodone-acetaminophen  1 tablet Oral Q8H  . labetalol  200 mg Oral BID  . multivitamin  1 tablet Oral QHS  .  NIFEdipine  60 mg Oral QHS     HD orders: [facility] MWF 4 hr 180 NRe 400/800 81.5 kg 3.0 K/2.25 Ca -No heparin  -Venofer 100 mg IV X 4 (not started yet) -Venofer 100 mg IV weekly (not started yet)  Assessment/Plan: 1.DOE-probably symptomatic anemia. HGB 6.4 on arrival which is actually improved from discharge date 08/27/18. Patient adamantly refuses transfusion. Continue iron load. Follow HGB. HGB 5.8 today.  2. Volume Overload-Resolved with HD. Started on furosemide on non HD days. Lower EDW on Dc.  2. ESRD -MWF at [facility]. HD today on schedule 4.0 K+ bath. K+ 3.3 3. Anemia -HGB improved to 6.3 but has fallen again to 5.8. Continue Iron load. Last Aranesp dose 08/27/18. 4. Secondary hyperparathyroidism -  5. HTN/volume -As noted above. Still hypertensive but improved. Increase nifedipine to 60 mg PO daily. Continue furosemide 40 mg on non HD days. Continue lowering volume as tolerated. Better control today.  6. Nutrition -Currently Renal diet with fld restrictions. Prostat, renal vits.Low K+ and phos. Liberalize diet. Change to regular diet.   Malania Gawthrop H. Natika Geyer NP-C 09/01/2018, 8:00 AM  [employer] [phone]

## 2018-09-01 NOTE — Progress Notes (Signed)
Renal Navigator notified OP HD clinic/[place] of patient's hospitalization and negative COVID 19 rapid test result in order to provide continuity of care and safety.  Alphonzo Cruise, [place] Renal Navigator [phone]

## 2018-09-01 NOTE — Plan of Care (Signed)
  Problem: Pain Managment: Goal: General experience of comfort will improve Outcome: Progressing   

## 2018-09-01 NOTE — Progress Notes (Signed)
[facility] PROGRESS NOTE  Bianca Myers HVF:[id number] DOB: 1945/07/29 DOA: 08/30/2018 PCP: Bianca Berger, MD  P Volume overload-not high output failure bu contribution from anemia recheck CXR in am No fever, no cough-Rx as edema [until RPT cxr] Echo as below Defer to nephro, ? Lower HTN meds Desated to 58's ambulating from RR--not ready for d/c ESRD Secondary hyperparathyroidism Renally mediated + Jehovah's Witness anemia Aranesp/iron/vitamin D/binders as per nephrology Appreciate input Appears to be dosing Lasix TTSS  HTN Labetalol 200 twice daily + addition of Procardia 60 at bedtime Jehovah's Witness The patient is at high risk for further decompensation given the fact that she does not accept blood  --- Synopsis [name] Jehovah's Witness-does not accept blood-baseline hemoglobin 5-6 range Recent MWF [place] ESRD, dialysis right renal catheter chest-left immature fistula-missed dialysis 6/26- Admit 6/29 DOE X1 week-she tells me she does not know what her EDW is and this has not been clarified given her recent start of dialysis Emergently dialyzed 3 L 6/29-nephrology consulted  DVT Heparin Code Status: Full CODE STATUS Communication: No family present Disposition Plan: Will need clearance from nephrology regarding planning for discharge in addition to EDW determination-expect will be here at least 73 hours   Bianca Backhaus, MD  [facility] [place] [email or web address] 7PM-7AM contact night coverage as above 09/01/2018, 1:29 PM  LOS: 1 day  ---  Consultants:  Nephrology  Procedures:  Echo 7/1 IMPRESSIONS    1. The left ventricle has normal systolic function with an ejection fraction of 60-65%. The cavity size was normal. There is mild asymmetric left ventricular hypertrophy. Left ventricular diastolic Doppler parameters are consistent with impaired  relaxation.  2. Mild outflow tract obstruction with 30 mm gradient.  3. The right ventricle has normal systolic function. The cavity was normal. There is no increase in right ventricular wall thickness.  4. Left atrial size was mild-moderately dilated.  5. Right atrial size was mildly dilated.  6. The mitral valve is degenerative. Mild thickening of the mitral valve leaflet. Mild calcification of the mitral valve leaflet. There is mild mitral annular calcification present. Mitral valve regurgitation is moderate by color flow Doppler.  7. The aortic valve is tricuspid. Mild thickening of the aortic valve. Mild calcification of the aortic valve. No stenosis of the aortic valve.  8. There is evidence of moderate plaque in the ascending aorta.  Antimicrobials:  None --- Today Feels moderately improved at rest but walking to the restroom winded her completely-she was in the 70s on room air and visibly diaphoretic after getting back from the restroom She has no chest pain She does have some mild sputum She has no fever no chills No nausea no vomiting   O  Vitals:  Vitals:   09/01/18 1034 09/01/18 1048  BP: 128/69 (!) 144/67  Pulse: 75 86  Resp: (!) 23   Temp: 97.9 F (36.6 C) 98.5 F (36.9 C)  SpO2: 99% 100%    Exam:  She is okay African-American female younger than stated age No icterus no pallor Chest is clear without rales rhonchi noted Right chest access, good thrill in left AV fistula Abdomen soft No lower extremity edema Neurologically intact moving all 4 limbs equally   I have personally reviewed the following:   DATA Potassium down to 3.4--3.3, BUN/creatinine 15/4.4-->29/6.5, bicarb 26 gap 12-->16 Hemoglobin 6.0-->5.8 white count no left shift   Scheduled Meds: . atorvastatin  10 mg Oral QHS  . Chlorhexidine Gluconate Cloth  6 each  Topical Q0600  . furosemide  40 mg Oral Q T,Th,S,Su  . heparin  5,000 Units Subcutaneous Q8H  . HYDROcodone-acetaminophen  1 tablet Oral Q8H  . labetalol  200 mg Oral BID  . multivitamin  1 tablet  Oral QHS  . NIFEdipine  60 mg Oral QHS   Continuous Infusions: . 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

## 2018-09-02 ENCOUNTER — Inpatient Hospital Stay (HOSPITAL_COMMUNITY): Payer: Medicare HMO

## 2018-09-02 LAB — CBC WITH DIFFERENTIAL/PLATELET
Abs Immature Granulocytes: 0.1 10*3/uL — ABNORMAL HIGH (ref 0.00–0.07)
Basophils Absolute: 0.1 10*3/uL (ref 0.0–0.1)
Basophils Relative: 1 %
Eosinophils Absolute: 0.6 10*3/uL — ABNORMAL HIGH (ref 0.0–0.5)
Eosinophils Relative: 6 %
HCT: 19.5 % — ABNORMAL LOW (ref 36.0–46.0)
Hemoglobin: 5.8 g/dL — CL (ref 12.0–15.0)
Lymphocytes Relative: 19 %
Lymphs Abs: 1.9 10*3/uL (ref 0.7–4.0)
MCH: 27.1 pg (ref 26.0–34.0)
MCHC: 29.7 g/dL — ABNORMAL LOW (ref 30.0–36.0)
MCV: 91.1 fL (ref 80.0–100.0)
Monocytes Absolute: 0.7 10*3/uL (ref 0.1–1.0)
Monocytes Relative: 7 %
Myelocytes: 1 %
Neutro Abs: 6.5 10*3/uL (ref 1.7–7.7)
Neutrophils Relative %: 66 %
Platelets: 243 10*3/uL (ref 150–400)
RBC: 2.14 MIL/uL — ABNORMAL LOW (ref 3.87–5.11)
RDW: 16.7 % — ABNORMAL HIGH (ref 11.5–15.5)
WBC: 9.8 10*3/uL (ref 4.0–10.5)
nRBC: 0 /100 WBC
nRBC: 0.6 % — ABNORMAL HIGH (ref 0.0–0.2)

## 2018-09-02 LAB — RENAL FUNCTION PANEL
Albumin: 2.5 g/dL — ABNORMAL LOW (ref 3.5–5.0)
Anion gap: 10 (ref 5–15)
BUN: 21 mg/dL (ref 8–23)
CO2: 27 mmol/L (ref 22–32)
Calcium: 8.3 mg/dL — ABNORMAL LOW (ref 8.9–10.3)
Chloride: 99 mmol/L (ref 98–111)
Creatinine, Ser: 4.64 mg/dL — ABNORMAL HIGH (ref 0.44–1.00)
GFR calc Af Amer: 10 mL/min — ABNORMAL LOW (ref >60)
GFR calc non Af Amer: 9 mL/min — ABNORMAL LOW (ref >60)
Glucose, Bld: 101 mg/dL — ABNORMAL HIGH (ref 70–99)
Phosphorus: 2.4 mg/dL — ABNORMAL LOW (ref 2.5–4.6)
Potassium: 3.8 mmol/L (ref 3.5–5.1)
Sodium: 136 mmol/L (ref 135–145)

## 2018-09-02 LAB — GLUCOSE, CAPILLARY
Glucose-Capillary: 109 mg/dL — ABNORMAL HIGH (ref 70–99)
Glucose-Capillary: 125 mg/dL — ABNORMAL HIGH (ref 70–99)

## 2018-09-02 IMAGING — DX DG SHOULDER 2+V*R*
2 series · 2 of 2 positions shown · non-contrast
Comparison: None.

CLINICAL DATA: Pain for 2 weeks after bowling.  No injury.

EXAM:
RIGHT SHOULDER - 2+ VIEW

[x shoulder ap right]
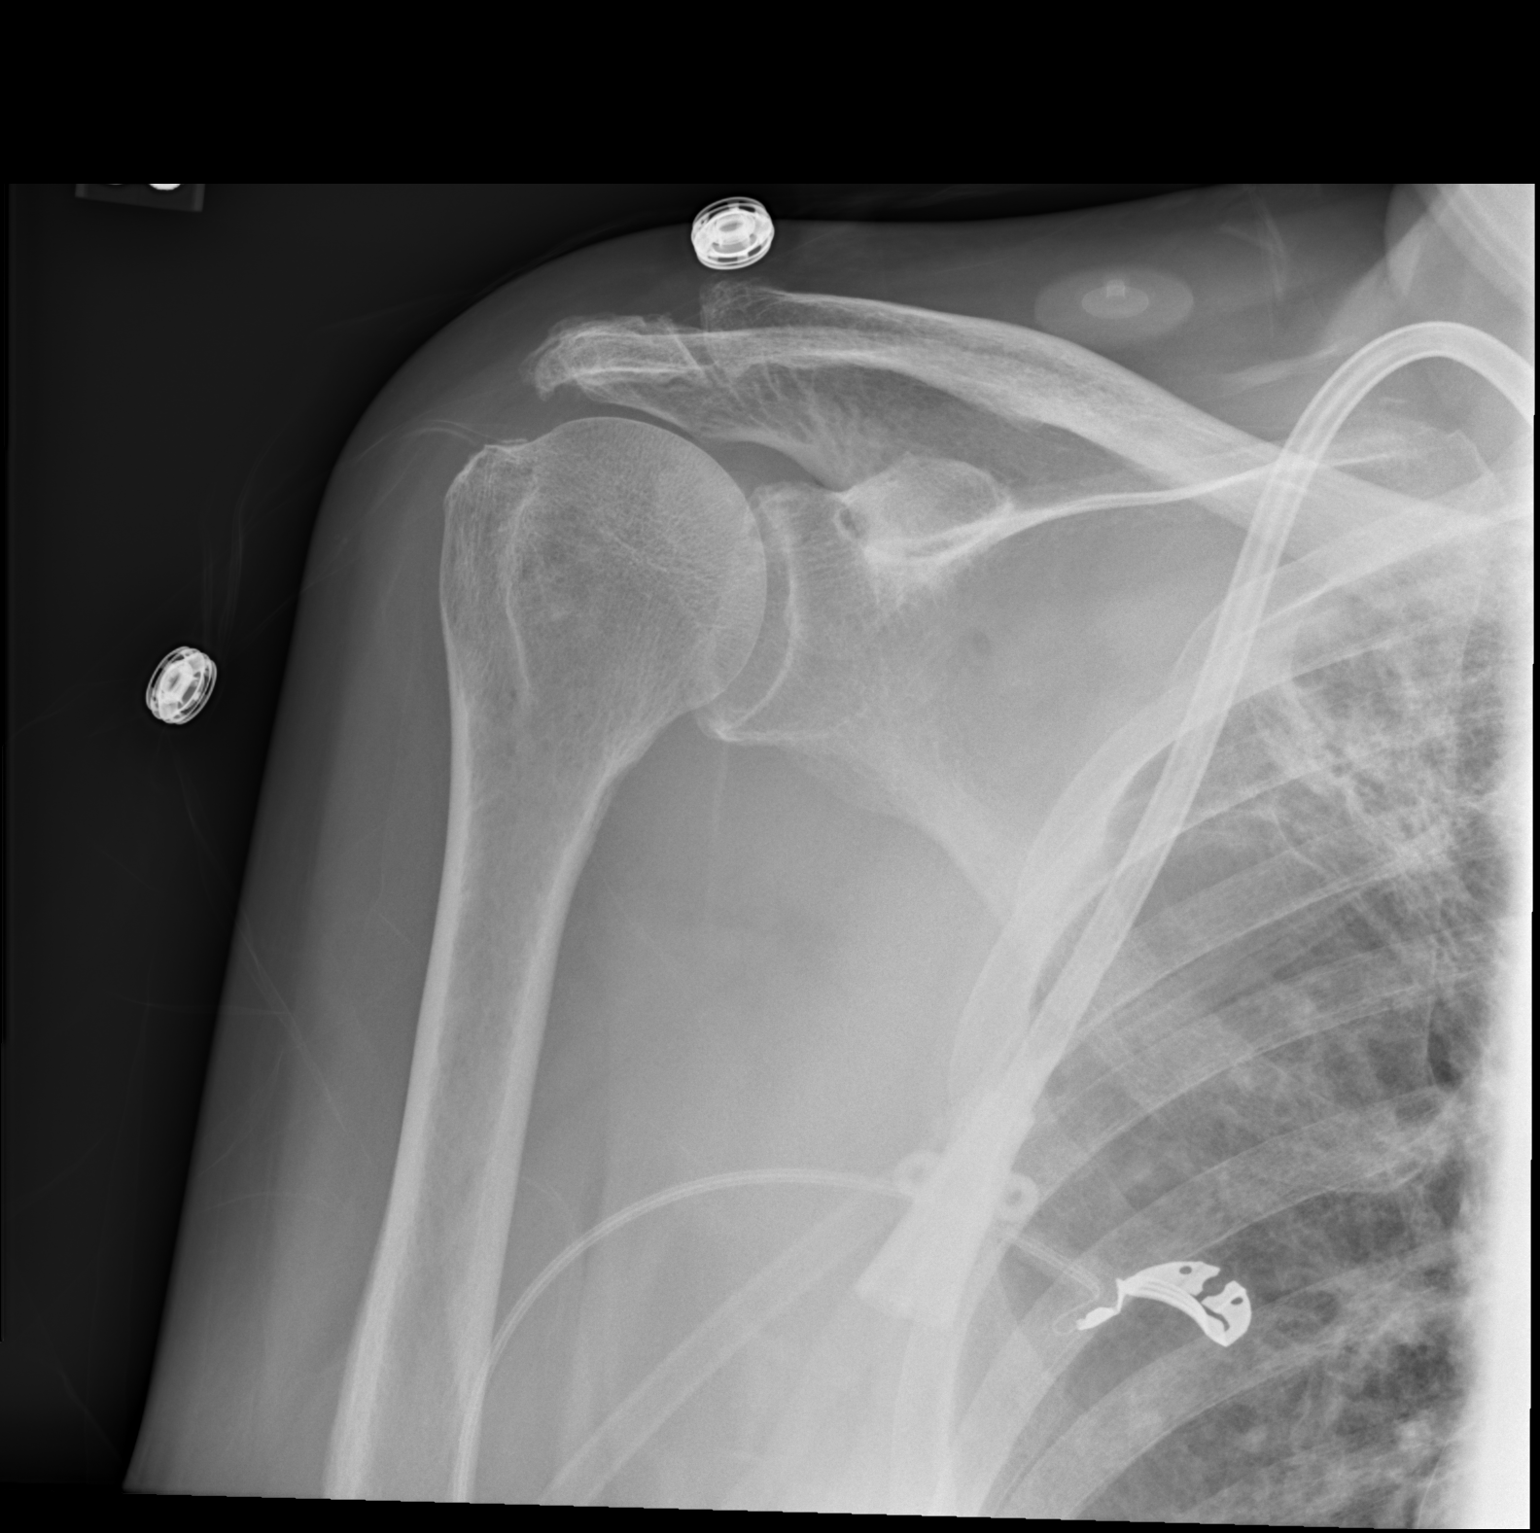

[x scapula y-view right]
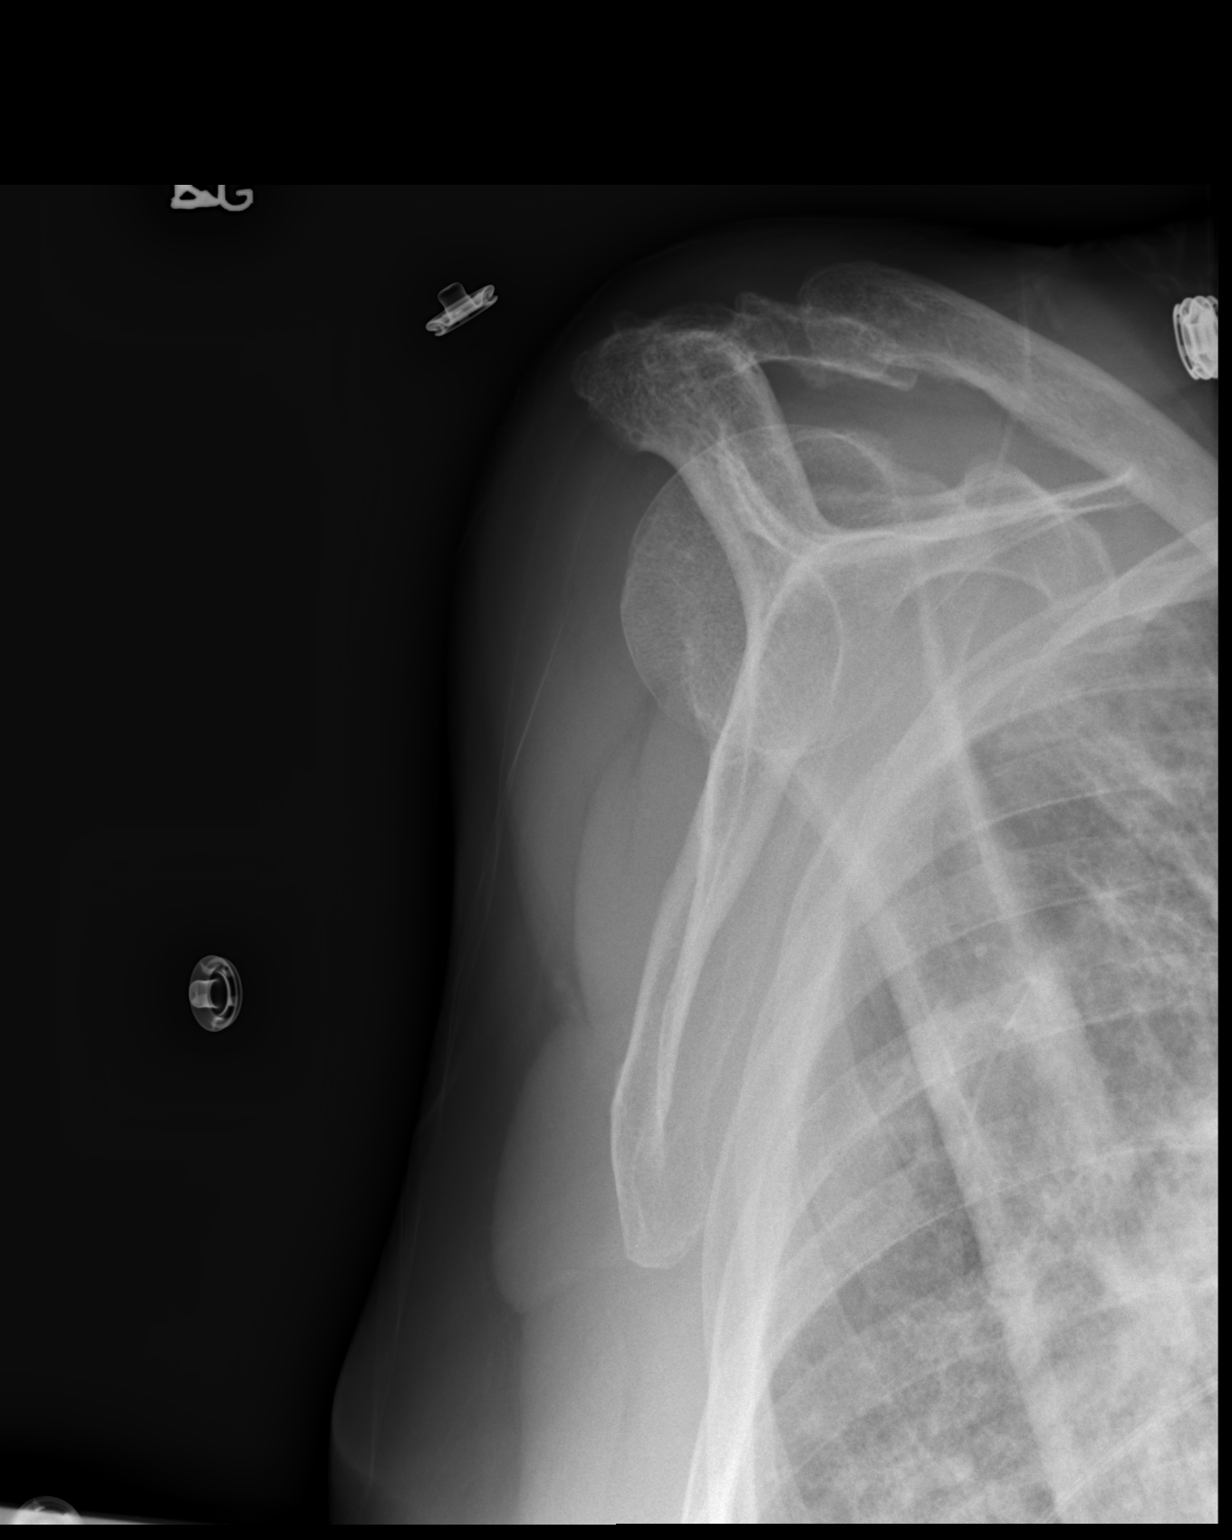

[2 of 2 positions shown; findings below may reference images not displayed]

FINDINGS: There is scarring seen in the right upper lobe, better assessed on
today's chest x-ray. No fracture or dislocation in the shoulder. No
bony lesions identified.
IMPRESSION: No cause for the patient's symptoms identified.

## 2018-09-02 IMAGING — CR CHEST - 2 VIEW
2 series · 2 of 2 positions shown · non-contrast
Comparison: [DATE] [DATE], [DATE], [DATE] [DATE], [DATE]

CLINICAL DATA: Pneumonia

EXAM:
CHEST - 2 VIEW

[chest lat]
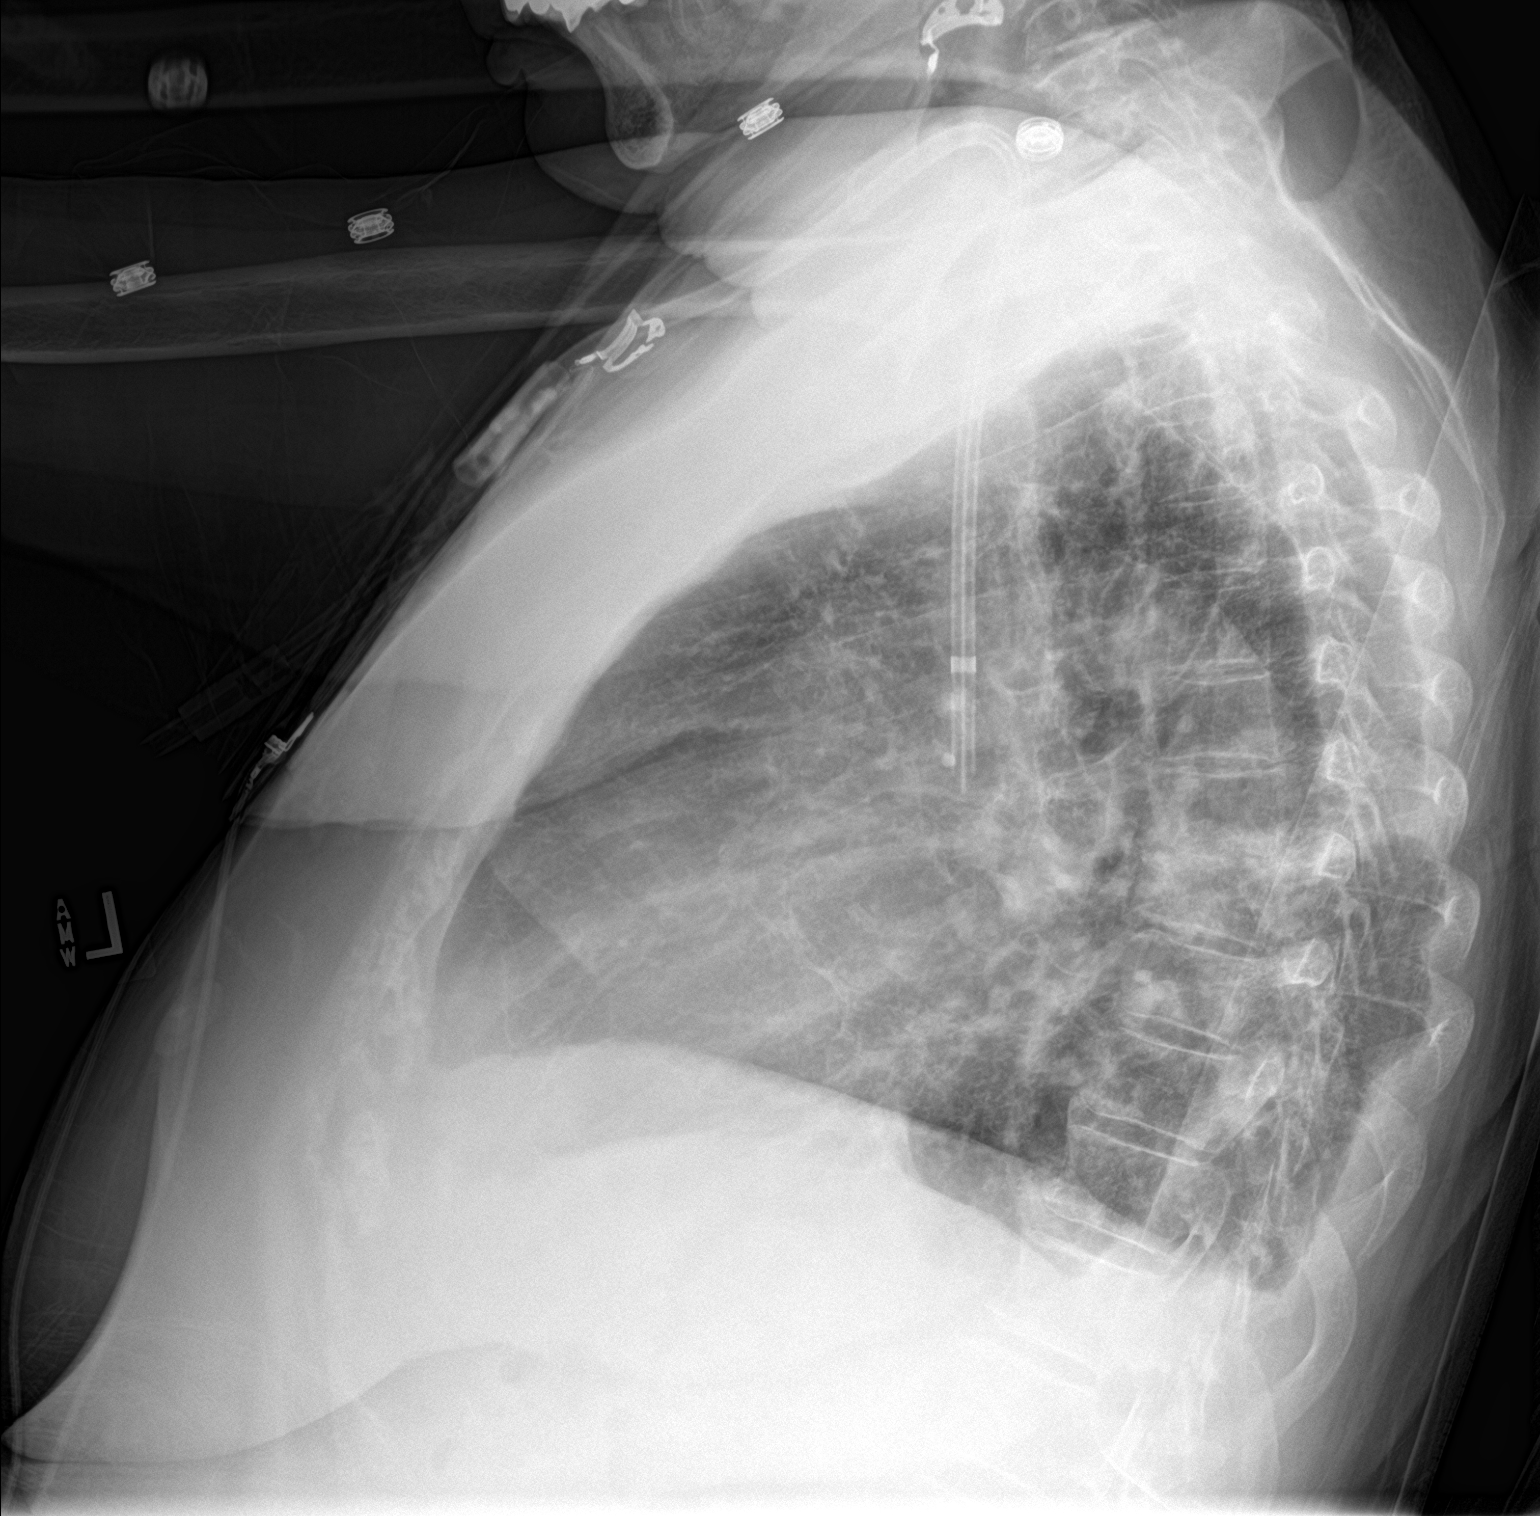

[chest ap]
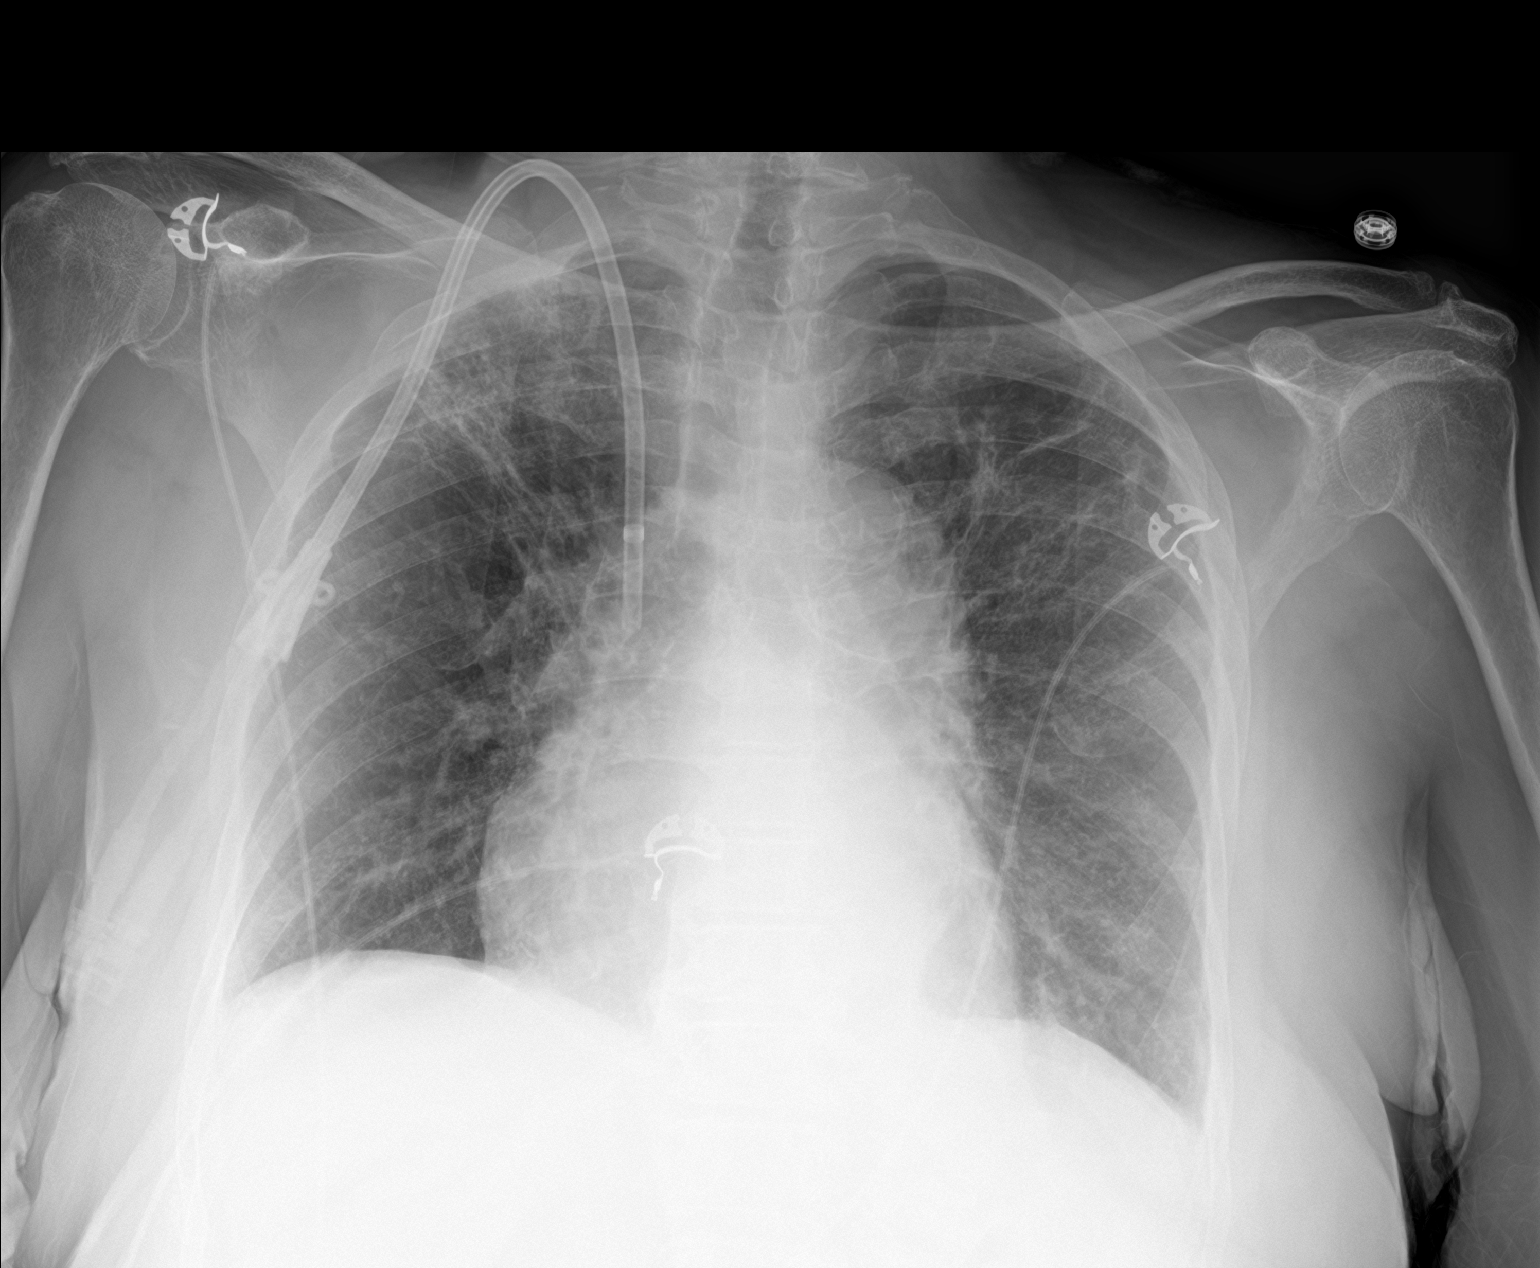

[2 of 2 positions shown; findings below may reference images not displayed]

FINDINGS: The heart size and mediastinal contours are stable. Right central
venous line is unchanged compared prior exam. The aorta is tortuous.
Scarring of bilateral upper lobes are identified. Minimal bilateral
posterior pleural effusions are identified. The visualized skeletal
structures are stable.
IMPRESSION: Minimal bilateral posterior pleural effusions. No focal pneumonia.
Scarring of bilateral upper lobes.

## 2018-09-02 MED ORDER — CHLORHEXIDINE GLUCONATE CLOTH 2 % EX PADS
6.0000 | MEDICATED_PAD | Freq: Every day | CUTANEOUS | Status: DC
  Administered 2018-09-03 – 2018-09-04 (×2): 6 via TOPICAL

## 2018-09-02 MED ORDER — PRO-STAT SUGAR FREE PO LIQD
30.0000 mL | Freq: Two times a day (BID) | ORAL | Status: DC
  Filled 2018-09-02 (×4): qty 30

## 2018-09-02 MED ORDER — DARBEPOETIN ALFA 200 MCG/0.4ML IJ SOSY
200.0000 ug | PREFILLED_SYRINGE | INTRAMUSCULAR | Status: DC
  Administered 2018-09-03: 200 ug via INTRAVENOUS
  Filled 2018-09-02: qty 0.4

## 2018-09-02 NOTE — Progress Notes (Addendum)
 [facility] Progress Note   Subjective: Seems to be doing better but O2 sat dropped to 70s while ambulating yesterday so discharge held. HGB holding at 5.8. Feels well today. No new complaints.   Objective Vitals:   09/01/18 1048 09/01/18 1538 09/01/18 2108 09/02/18 0857  BP: (!) 144/67 (!) 143/74 (!) 150/72 129/63  Pulse: 86 66 63 78  Resp:  18 18 18   Temp: 98.5 F (36.9 C) 99.7 F (37.6 C) 99.3 F (37.4 C) 98.5 F (36.9 C)  TempSrc: Oral Oral Oral Oral  SpO2: 100% 100% 100% 100%  Weight:   92 kg    Physical Exam General:Chronically ill appearing female in NAD Heart:S1,S2 RRR  Lungs:CTAB Abdomen:Active BS Neuro: Alert, oriented X 3.  Extremities:Trace bilateral LE edema L>R Dialysis Access:Maturing LUA AVF + bruit. RIJ TDC drsg intact   Additional Objective Labs: Basic Metabolic Panel: Recent Labs  Lab 08/27/18 0714  08/31/18 0603 09/01/18 0539 09/02/18 0524  NA 134*   < > 135 138 136  K 3.7   < > 3.4* 3.3* 3.8  CL 97*   < > 97* 97* 99  CO2 23   < > 26 25 27   GLUCOSE 109*   < > 106* 96 101*  BUN 35*   < > 15 29* 21  CREATININE 7.99*   < > 4.46* 6.57* 4.64*  CALCIUM 8.0*   < > 8.1* 8.3* 8.3*  PHOS 3.9  --   --  3.0 2.4*   < > = values in this interval not displayed.   Liver Function Tests: Recent Labs  Lab 08/27/18 0714 09/01/18 0539 09/02/18 0524  ALBUMIN 2.4* 2.3* 2.5*   No results for input(s): LIPASE, AMYLASE in the last 168 hours. CBC: Recent Labs  Lab 08/27/18 0713 08/30/18 1005  08/31/18 0603 09/01/18 0539 09/02/18 0524  WBC 14.3* 10.4  --  9.1 10.1 9.8  NEUTROABS  --  7.9*  --   --  7.4 6.5  HGB 5.4* 6.3*   < > 6.0* 5.8* 5.8*  HCT 18.3* 21.3*   < > 19.8* 19.6* 19.5*  MCV 90.6 91.4  --  88.8 89.9 91.1  PLT 165 246  --  220 224 243   < > = values in this interval not displayed.   Blood Culture No results found for: SDES, SPECREQUEST, CULT, REPTSTATUS  Cardiac Enzymes: No results for input(s): CKTOTAL, CKMB,  CKMBINDEX, TROPONINI in the last 168 hours. CBG: Recent Labs  Lab 08/31/18 1142 08/31/18 1636 08/31/18 2104 09/01/18 2107 09/02/18 0702  GLUCAP 123* 134* 97 122* 109*   Iron Studies: No results for input(s): IRON, TIBC, TRANSFERRIN, FERRITIN in the last 72 hours. @lablastinr3 @ Studies/Results: Dg Chest 2 View  Result Date: 09/02/2018 CLINICAL DATA:  Pneumonia EXAM: CHEST - 2 VIEW COMPARISON:  August 31, 2018, October 31, 2013 FINDINGS: The heart size and mediastinal contours are stable. Right central venous line is unchanged compared prior exam. The aorta is tortuous. Scarring of bilateral upper lobes are identified. Minimal bilateral posterior pleural effusions are identified. The visualized skeletal structures are stable. IMPRESSION: Minimal bilateral posterior pleural effusions. No focal pneumonia. Scarring of bilateral upper lobes. Electronically Signed   By: Abelardo Diesel M.D.   On: 09/02/2018 08:49   Medications: . ferric gluconate (FERRLECIT/NULECIT) IV 125 mg (09/01/18 0939)   . atorvastatin  10 mg Oral QHS  . Chlorhexidine Gluconate Cloth  6 each Topical Q0600  . furosemide  40 mg Oral Q T,Th,S,Su  .  heparin  5,000 Units Subcutaneous Q8H  . HYDROcodone-acetaminophen  1 tablet Oral Q8H  . labetalol  200 mg Oral BID  . multivitamin  1 tablet Oral QHS  . NIFEdipine  60 mg Oral QHS     HD orders: [facility] MWF 4 hr 180 NRe 400/800 81.5 kg 3.0 K/2.25 Ca -No heparin  -Venofer 100 mg IV X 4 (not started yet) -Venofer 100 mg IV weekly (not started yet)  Assessment/Plan: 1.DOE-probably symptomatic anemia. HGB 6.4 on arrival which is actually improved from discharge date 08/27/18. Patient adamantly refuses transfusion. Continue iron load. Follow HGB. HGB 5.8 today. 2. Volume Overload-Resolved with HD. Started on furosemide on non HD days. Lower EDW on Dc. 2. ESRD -MWF at [facility].HD tomorrow on schedule 4.0 K+ bath. K+ 3.8. No heparin.  3. Anemia -HGB improved to 6.3 but has fallen  again to 5.8. Continue Iron load. Last Aranesp dose 08/27/18.Give Aranesp 200 mcg IV with HD tomorrow.  4. Secondary hyperparathyroidism - Phos 2.4. No binders. Liberalize diet.  Ca 8.3 C ca 9.5 5. HTN/volume -Increase nifedipine to 60 mg PO daily. Continue furosemide 40 mg on non HD days. Continue lowering volume as tolerated.Volume and BP seem fine now-believe DOE is related to low HGB. Post wt are incorrect! Doubt she has gained 10 kg since adm! Getting standing wt now.  6. Nutrition -Currently Renal diet with fld restrictions. Prostat, renal vits.Low K+ and phos. Liberalize diet. Change to regular diet.    H.  NP-C 09/02/2018, 10:13 AM  [employer] [phone]

## 2018-09-02 NOTE — Progress Notes (Addendum)
[facility] PROGRESS NOTE  Bianca Myers MCN:[id number] DOB: Feb 27, 1946 DOA: 08/30/2018 PCP: [name], MD  P Volume overload-not high output failure bu contribution from anemia Echo as below-no high OP HF Management deferred to nephrology e: meds etc desat screen--dropped to 84% on O2 stand wght pend--is 97% on RA at rest ESRD Secondary hyperparathyroidism Renally mediated + Jehovah's Witness anemia Aranesp/iron/vitamin D/binders as per nephrology Lasix TTSS  HTN urgency Labetalol 200 twice daily + addition of Procardia 60 at bedtime Jehovah's Witness The patient is at high risk for further decompensation given the fact that she does not accept blood STOP BLOOD DRAW, do hemoccue in am  --- Synopsis [name] Jehovah's Witness-does not accept blood-baseline hemoglobin 5-6 range Recent MWF [place] ESRD, dialysis right renal catheter chest-left immature fistula-missed dialysis 6/26- Admit 6/29 DOE X1 week-she tells me she does not know what her EDW is and this has not been clarified given her recent start of dialysis Emergently dialyzed 3 L 6/29-nephrology consulted  DVT Heparin Code Status: Full CODE STATUS Communication: No family present Disposition Plan: await further stability--still SOB with ambulation, but some better   [name], MD  [facility] [employer] app [place] [email or web address] 7PM-7AM contact night coverage as above 09/02/2018, 4:03 PM  LOS: 2 days  ---  Consultants:  Nephrology  Procedures:  Echo 7/1 IMPRESSIONS    1. The left ventricle has normal systolic function with an ejection fraction of 60-65%. The cavity size was normal. There is mild asymmetric left ventricular hypertrophy. Left ventricular diastolic Doppler parameters are consistent with impaired  relaxation.  2. Mild outflow tract obstruction with 30 mm gradient.  3. The right ventricle has normal systolic function. The cavity was normal. There is  no increase in right ventricular wall thickness.  4. Left atrial size was mild-moderately dilated.  5. Right atrial size was mildly dilated.  6. The mitral valve is degenerative. Mild thickening of the mitral valve leaflet. Mild calcification of the mitral valve leaflet. There is mild mitral annular calcification present. Mitral valve regurgitation is moderate by color flow Doppler.  7. The aortic valve is tricuspid. Mild thickening of the aortic valve. Mild calcification of the aortic valve. No stenosis of the aortic valve.  8. There is evidence of moderate plaque in the ascending aorta.  Antimicrobials:  None --- Today  still winded--not completely at baseline in terms of breathing No CP today No fever no chills  O  Vitals:  Vitals:   09/02/18 0857 09/02/18 1400  BP: 129/63   Pulse: 78   Resp: 18   Temp: 98.5 F (36.9 C)   SpO2: 100% (!) 84%    Exam:   younger than stated age No icterus no pallor Chest clear Right chest access, good thrill in left AV fistula Abdomen soft No lower extremity edema Neurologically intact moving all 4 limbs equally   I have personally reviewed the following:   DATA Potassium down to 3.4--3.3-->3.8 BUN/creatinine 15/4.4-->29/6.5, bicarb 27  gap 10 Hemoglobin 6.0-->5.8    Scheduled Meds: . atorvastatin  10 mg Oral QHS  . Chlorhexidine Gluconate Cloth  6 each Topical Q0600  . Chlorhexidine Gluconate Cloth  6 each Topical Q0600  . [START ON 09/03/2018] darbepoetin (ARANESP) injection - DIALYSIS  200 mcg Intravenous Q Fri-HD  . feeding supplement (PRO-STAT SUGAR FREE 64)  30 mL Oral BID  . furosemide  40 mg Oral Q T,Th,S,Su  . heparin  5,000 Units Subcutaneous Q8H  . HYDROcodone-acetaminophen  1 tablet  Oral Q8H  . labetalol  200 mg Oral BID  . multivitamin  1 tablet Oral QHS  . NIFEdipine  60 mg Oral QHS   Continuous Infusions: . 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

## 2018-09-02 NOTE — Progress Notes (Signed)
Patient ambulated with staff  with 02 at 3L/,desaturated to 84% . Unable to walk far.

## 2018-09-02 NOTE — Progress Notes (Signed)
OT Cancellation Note  Patient Details Name: Bianca Myers MRN: [id number] DOB: June 10, 1945   Cancelled Treatment:    Reason Eval/Treat Not Completed: Patient at procedure or test/ unavailable. Pt out of room for procedure. OT to reattempt.   Tyrone Schimke, OT Acute Rehabilitation Services Pager: [phone] Office: [phone]  09/02/2018, 12:37 PM

## 2018-09-02 NOTE — Progress Notes (Signed)
SATURATION QUALIFICATIONS: (This note is used to comply with regulatory documentation for home oxygen)  Patient Saturations on Room Air at Rest = 86  Patient Saturations on Room Air while Ambulating = 80%  Patient Saturations on 3 Liters of oxygen while Ambulating =84 %  Please briefly explain why patient needs home oxygen:  Patient gets short of breath on exertion/ ambulating at short distance.   Patient weight standing = 176.5 lbs

## 2018-09-03 LAB — GLUCOSE, CAPILLARY
Glucose-Capillary: 84 mg/dL (ref 70–99)
Glucose-Capillary: 89 mg/dL (ref 70–99)
Glucose-Capillary: 112 mg/dL — ABNORMAL HIGH (ref 70–99)
Glucose-Capillary: 108 mg/dL — ABNORMAL HIGH (ref 70–99)

## 2018-09-03 MED ORDER — DARBEPOETIN ALFA 200 MCG/0.4ML IJ SOSY
PREFILLED_SYRINGE | INTRAMUSCULAR | Status: AC
  Filled 2018-09-03: qty 0.4

## 2018-09-03 MED ORDER — HEPARIN SODIUM (PORCINE) 1000 UNIT/ML IJ SOLN
INTRAMUSCULAR | Status: AC
  Administered 2018-09-03: 1000 [IU]
  Filled 2018-09-03: qty 4

## 2018-09-03 NOTE — Progress Notes (Signed)
SATURATION QUALIFICATIONS: (This note is used to comply with regulatory documentation for home oxygen)  Patient Saturations on Room Air at Rest = 97 %  Patient Saturations on Room Air while Ambulating = 88 %  Patient Saturations on 3 Liters of oxygen while Ambulating =97 %  Please briefly explain why patient needs home oxygen:  Patient gets short of breath on exertion/ ambulating at short distance

## 2018-09-03 NOTE — TOC Initial Note (Signed)
Transition of Care [facility]) - Initial/Assessment Note    Patient Details  Name: Bianca Myers MRN: [id number] Date of Birth: 21-Oct-1945  Transition of Care [facility]) CM/SW Contact:    Bartholomew Crews, RN Phone Number: [phone] 09/03/2018, 5:10 PM  Clinical Narrative:                 Spoke with patient at the bedside. Discussed need for home oxygen d/t sats dropped to 88% while ambulating on RA - referral placed to [employer]. Patient was talking to son on the phone and asked about 3N1 and RW vs rollator. Noted OT recommendation for HHOT and 3N1 - will need PT consult with recommendations. PT order placed. Anticipate transition home tomorrow. Choice list for home health provided. CM to follow for transition of care needs.   Expected Discharge Plan: [place] Barriers to Discharge: Continued Medical Work up   Patient Goals and CMS Choice   CMS Medicare.gov Compare Post Acute Care list provided to:: Patient Choice offered to / list presented to : Patient  Expected Discharge Plan and Services Expected Discharge Plan: [place] [facility]   Discharge Planning Services: CM Consult Post Acute Care Choice: Durable Medical Equipment, Home Health Living arrangements for the past 2 months: Single Family Home                                      Prior Living Arrangements/Services Living arrangements for the past 2 months: Single Family Home Lives with:: Self                   Activities of Daily Living   ADL Screening (condition at time of admission) Patient's cognitive ability adequate to safely complete daily activities?: Yes Is the patient deaf or have difficulty hearing?: No Does the patient have difficulty seeing, even when wearing glasses/contacts?: No Does the patient have difficulty concentrating, remembering, or making decisions?: Yes Patient able to express need for assistance with ADLs?: Yes Does the patient have difficulty dressing or  bathing?: No Independently performs ADLs?: No Communication: Independent Dressing (OT): Independent Grooming: Independent Feeding: Independent Bathing: Independent Toileting: Independent In/Out Bed: Independent Walks in Home: Needs assistance Is this a change from baseline?: Pre-admission baseline Does the patient have difficulty walking or climbing stairs?: Yes Weakness of Legs: None Weakness of Arms/Hands: None  Permission Sought/Granted                  Emotional Assessment Appearance:: Appears younger than stated age Attitude/Demeanor/Rapport: Engaged Affect (typically observed): Accepting Orientation: : Oriented to Self, Oriented to Place, Oriented to  Time, Oriented to Situation Alcohol / Substance Use: Not Applicable Psych Involvement: No (comment)  Admission diagnosis:  SOB (shortness of breath) [R06.02] Elevated brain natriuretic peptide (BNP) level [R79.89] Patient Active Problem List   Diagnosis Date Noted  . Heart failure ([place]) 08/31/2018  . DOE (dyspnea on exertion) 08/30/2018  . Elevated brain natriuretic peptide (BNP) level 08/30/2018  . Anemia, chronic renal failure, stage 5 (HCC) 08/30/2018  . Anemia of chronic kidney failure, stage 5 ([place]) 08/27/2018  . Chronic depression 08/27/2018  . Patient is Jehovah's Witness 08/27/2018  . Essential hypertension   . CKD (chronic kidney disease) requiring chronic dialysis ([place])   . Anemia   . Syncope 08/20/2018  . Chronic kidney disease (CKD), stage IV (severe) ([place]) 12/10/2015   PCP:  [name], MD  Pharmacy:   [place], [address] [place] [address] Phone: [phone] Fax: [phone]  [facility] [place], [address] [place] [address] Phone: [phone] Fax: [place], [place] [address] Phone: [phone] Fax: [phone]     Social Determinants of Health (SDOH) Interventions    Readmission Risk Interventions No flowsheet data found.

## 2018-09-03 NOTE — Progress Notes (Signed)
[facility] Progress Note   Subjective:   Patient seen and examined at bedside.  O2 sat dropped to 84% while ambulating with 3L O2 yesterday.  Continues to have SOB.  Discussed anxiety while in HD.  Objective Vitals:   09/03/18 0845 09/03/18 1240 09/03/18 1245 09/03/18 1300  BP: 130/69 140/70 (!) 145/71 137/75  Pulse: 68 70 69 70  Resp: 18 18 18 18   Temp: 98.3 F (36.8 C) 98.6 F (37 C)    TempSrc: Oral Oral    SpO2: 100% 99%    Weight:  92.8 kg     Physical Exam General:NAD, chronically ill appearing female Heart:RRR, no mrg Lungs:CTAB Abdomen:+BS, NTND Extremities:no LE edema Dialysis Access: R IJ TDC, LU AVF maturing +b/t   Filed Weights   09/01/18 1034 09/01/18 2108 09/03/18 1240  Weight: 92 kg 92 kg 92.8 kg    Intake/Output Summary (Last 24 hours) at 09/03/2018 1325 Last data filed at 09/03/2018 0900 Gross per 24 hour  Intake 460 ml  Output 100 ml  Net 360 ml    Additional Objective Labs: Basic Metabolic Panel: Recent Labs  Lab 08/31/18 0603 09/01/18 0539 09/02/18 0524  NA 135 138 136  K 3.4* 3.3* 3.8  CL 97* 97* 99  CO2 26 25 27   GLUCOSE 106* 96 101*  BUN 15 29* 21  CREATININE 4.46* 6.57* 4.64*  CALCIUM 8.1* 8.3* 8.3*  PHOS  --  3.0 2.4*   Liver Function Tests: Recent Labs  Lab 09/01/18 0539 09/02/18 0524  ALBUMIN 2.3* 2.5*   CBC: Recent Labs  Lab 08/30/18 1005  08/31/18 0603 09/01/18 0539 09/02/18 0524  WBC 10.4  --  9.1 10.1 9.8  NEUTROABS 7.9*  --   --  7.4 6.5  HGB 6.3*   < > 6.0* 5.8* 5.8*  HCT 21.3*   < > 19.8* 19.6* 19.5*  MCV 91.4  --  88.8 89.9 91.1  PLT 246  --  220 224 243   < > = values in this interval not displayed.   CBG: Recent Labs  Lab 09/01/18 2107 09/02/18 0702 09/02/18 2024 09/03/18 0708 09/03/18 1125  GLUCAP 122* 109* 125* 84 89   Studies/Results: Dg Chest 2 View  Result Date: 09/02/2018 CLINICAL DATA:  Pneumonia EXAM: CHEST - 2 VIEW COMPARISON:  August 31, 2018, October 31, 2013 FINDINGS:  The heart size and mediastinal contours are stable. Right central venous line is unchanged compared prior exam. The aorta is tortuous. Scarring of bilateral upper lobes are identified. Minimal bilateral posterior pleural effusions are identified. The visualized skeletal structures are stable. IMPRESSION: Minimal bilateral posterior pleural effusions. No focal pneumonia. Scarring of bilateral upper lobes. Electronically Signed   By: Abelardo Diesel M.D.   On: 09/02/2018 08:49   Dg Shoulder Right  Result Date: 09/02/2018 CLINICAL DATA:  Pain for 2 weeks after bowling.  No injury. EXAM: RIGHT SHOULDER - 2+ VIEW COMPARISON:  None. FINDINGS: There is scarring seen in the right upper lobe, better assessed on today's chest x-ray. No fracture or dislocation in the shoulder. No bony lesions identified. IMPRESSION: No cause for the patient's symptoms identified. Electronically Signed   By: Dorise Bullion III M.D   On: 09/02/2018 12:41    Medications: . ferric gluconate (FERRLECIT/NULECIT) IV 125 mg (09/01/18 0939)   . atorvastatin  10 mg Oral QHS  . Chlorhexidine Gluconate Cloth  6 each Topical Q0600  . Chlorhexidine Gluconate Cloth  6 each Topical Q0600  . darbepoetin (ARANESP) injection -  DIALYSIS  200 mcg Intravenous Q Fri-HD  . feeding supplement (PRO-STAT SUGAR FREE 64)  30 mL Oral BID  . furosemide  40 mg Oral Q T,Th,S,Su  . heparin  5,000 Units Subcutaneous Q8H  . HYDROcodone-acetaminophen  1 tablet Oral Q8H  . labetalol  200 mg Oral BID  . multivitamin  1 tablet Oral QHS  . NIFEdipine  60 mg Oral QHS    Dialysis Orders: [facility] MWF 4 hr 180 NRe 400/800 81.5 kg 3.0 K/2.25 Ca -No heparin  -Venofer 100 mg IV X 4 (not started yet) -Venofer 100 mg IV weekly (not started yet)  Assessment/Plan: 1. DOE - likely d/t symptomatic anemia.  Per notes looking to see if she qualifies for home O2.  Per primary.  2. Volume overload - resolved with HD.  Started on furosemide on ono HD days.  Will need lower  EDW on d/c. 2. ESRD - HD MWF.  K 3.8 on 4K bath as IP.  HD today per regular schedule.  Anxiety when she goes to treatments.  Suggested if it does not improved to discuss with her PCP after d/c.  3. Anemia of CKD- Hgb 5.8, refusing transfusion b/c Jehovah's Witness. Continue Fe load and aranesp 270mcg qwk.  4. Secondary hyperparathyroidism - phos 2.4, No binder, diet liberalized. CCa 9.5 5. HTN/volume - Nifedipine increased to 60mg  PO qd during stay.  Continue furosemide 40mg  on non HD days.  Titrate down volume as tolerated.  Need standing weights to better assess EDW.  6. Nutrition - Liberalized diet d/t low phos and K.  Jen Mow, PA-C [facility] Pager: [phone] 09/03/2018,1:25 PM  LOS: 3 days

## 2018-09-03 NOTE — Plan of Care (Signed)
°  Problem: Clinical Measurements: °Goal: Diagnostic test results will improve °Outcome: Progressing °  °Problem: Clinical Measurements: °Goal: Respiratory complications will improve °Outcome: Progressing °  °Problem: Activity: °Goal: Risk for activity intolerance will decrease °Outcome: Progressing °  °Problem: Nutrition: °Goal: Adequate nutrition will be maintained °Outcome: Progressing °  °

## 2018-09-03 NOTE — Progress Notes (Signed)
[facility] PROGRESS NOTE  Bianca Myers JKK:[id number] DOB: 13-Apr-1945 DOA: 08/30/2018 PCP: Bianca Berger, MD  P Volume overload Hypoxic respiratory failure not high outpufailure, based on echo Echo as below Management deferred to nephrology e: meds etc Continues to desaturate but tolerance for exercise is improving-have encouraged her to continue to do the same ESRD Secondary hyperparathyroidism Renally mediated + Jehovah's Witness anemia Aranesp/iron/vitamin D/binders as per nephrology Lasix TTSS  Going for dialysis second shift 2day if remains stable over the course of the next 24 to 48 hours may consider discharge home 09/04/2018 if okay with nephrology HTN urgency Labetalol 200 twice daily + addition of Procardia 60 at bedtime Jehovah's Witness The patient is at high risk for further decompensation given the fact that she does not accept blood 1 more blood draw prior to discharge home in a.m.  --- Synopsis [name] Jehovah's Witness-does not accept blood-baseline hemoglobin 5-6 range Recent MWF [place] ESRD, dialysis right renal catheter chest-left immature fistula-missed dialysis 6/26- Admit 6/29 DOE X1 week-she tells me she does not know what her EDW is and this has not been clarified given her recent start of dialysis Emergently dialyzed 3 L 6/29-nephrology consulted  Hospitalization complicated by shortness of breath inability to ambulate without severe diaphoresis and new oxygen requirement Slowly improving  DVT Heparin Code Status: Full CODE STATUS Communication: No family present Disposition Plan: await further stability--still SOB with ambulation, but some better   Bianca Au, MD  [facility] Via [employer] app OR -[email or web address] 7PM-7AM contact night coverage as above 09/03/2018, 1:26 PM  LOS: 3 days  ---  Consultants:  Nephrology  Procedures:  Echo 7/1 IMPRESSIONS    1. The left ventricle has normal systolic  function with an ejection fraction of 60-65%. The cavity size was normal. There is mild asymmetric left ventricular hypertrophy. Left ventricular diastolic Doppler parameters are consistent with impaired  relaxation.  2. Mild outflow tract obstruction with 30 mm gradient.  3. The right ventricle has normal systolic function. The cavity was normal. There is no increase in right ventricular wall thickness.  4. Left atrial size was mild-moderately dilated.  5. Right atrial size was mildly dilated.  6. The mitral valve is degenerative. Mild thickening of the mitral valve leaflet. Mild calcification of the mitral valve leaflet. There is mild mitral annular calcification present. Mitral valve regurgitation is moderate by color flow Doppler.  7. The aortic valve is tricuspid. Mild thickening of the aortic valve. Mild calcification of the aortic valve. No stenosis of the aortic valve.  8. There is evidence of moderate plaque in the ascending aorta.  Antimicrobials:  None --- Today  Alert coherent eating lunch Going for dialysis later this evening No chest pain Still needing 3 L of oxygen with ambulation  O  Vitals:  Vitals:   09/03/18 1245 09/03/18 1300  BP: (!) 145/71 137/75  Pulse: 69 70  Resp: 18 18  Temp:    SpO2:      Exam:   younger than stated age No icterus no pallor Chest clear no added sound no egophony no fremitus Right chest access, good thrill in left AV fistula Abdomen soft No lower extremity edema Neurologically intact moving all 4 limbs equally   I have personally reviewed the following:   DATA No labs today   Scheduled Meds: . atorvastatin  10 mg Oral QHS  . Chlorhexidine Gluconate Cloth  6 each Topical Q0600  . Chlorhexidine Gluconate Cloth  6 each Topical Q0600  .  darbepoetin (ARANESP) injection - DIALYSIS  200 mcg Intravenous Q Fri-HD  . feeding supplement (PRO-STAT SUGAR FREE 64)  30 mL Oral BID  . furosemide  40 mg Oral Q T,Th,S,Su  . heparin   5,000 Units Subcutaneous Q8H  . HYDROcodone-acetaminophen  1 tablet Oral Q8H  . labetalol  200 mg Oral BID  . multivitamin  1 tablet Oral QHS  . NIFEdipine  60 mg Oral QHS   Continuous Infusions: . ferric gluconate (FERRLECIT/NULECIT) IV 125 mg (09/01/18 0939)    Principal Problem:   DOE (dyspnea on exertion) Active Problems:   CKD (chronic kidney disease) requiring chronic dialysis (HCC)   Anemia of chronic kidney failure, stage 5 (HCC)   Chronic depression   Patient is Jehovah's Witness   Essential hypertension   Elevated brain natriuretic peptide (BNP) level   Anemia, chronic renal failure, stage 5 (HCC)   Heart failure ([place])   LOS: 3 days

## 2018-09-03 NOTE — Evaluation (Signed)
Occupational Therapy Evaluation Patient Details Name: Bianca Myers MRN: [id number] DOB: [birth year] Today's Date: 09/03/2018    History of Present Illness 73 y.o. female with past medical history significant for stage V renal failure, (initiated on dialysis last hospitalization stay), hypertension and significant anemia who presents with weakness and dyspnea. Of note pt with recent admit, discharged on 6/26.    Clinical Impression   This 73 y/o female presents with the above. PTA pt reports she is independent with ADL, iADL and functional mobility. Pt currently presenting with weakness, decreased activity tolerance, and R shoulder pain (reports due to recent falls) impacting her functional mobility. Pt currently requires minA-minguard assist for short distance mobility in room using RW. She currently requires setup assist for UB ADL, minA for LB ADL. Pt on 3L O2 during this session with O2 sats maintaining at 88% and above with activity. Pt lives alone, reports her son is staying with her at the moment but can only stay for a brief time. She will benefit from continued acute OT services and recommend follow up [facility] services after discharge to progress pt towards PLOF. Will follow.     Follow Up Recommendations  Home health OT;Supervision/Assistance - 24 hour(24hr initially)    Equipment Recommendations  3 in 1 bedside commode;Other (comment)(RW)           Precautions / Restrictions Precautions Precautions: Fall Precaution Comments: Reports x3 falls in past 3 weeks; anemia, per chart pt is Jehovah's Witness and declines blood transfusion.  Restrictions Weight Bearing Restrictions: No      Mobility Bed Mobility Overal bed mobility: Needs Assistance Bed Mobility: Supine to Sit;Sit to Supine     Supine to sit: Supervision Sit to supine: Supervision   General bed mobility comments: for safety, lines  Transfers Overall transfer level: Needs assistance Equipment used: Rolling  walker (2 wheeled) Transfers: Sit to/from Stand Sit to Stand: Min guard         General transfer comment: for safety and balance    Balance Overall balance assessment: Needs assistance Sitting-balance support: Feet supported Sitting balance-Leahy Scale: Good     Standing balance support: Bilateral upper extremity supported Standing balance-Leahy Scale: Poor Standing balance comment: reliant on UE support                            ADL either performed or assessed with clinical judgement   ADL Overall ADL's : Needs assistance/impaired Eating/Feeding: Modified independent;Sitting   Grooming: Set up;Sitting   Upper Body Bathing: Set up;Sitting   Lower Body Bathing: Minimal assistance;Sit to/from stand   Upper Body Dressing : Set up;Sitting   Lower Body Dressing: Minimal assistance;Sit to/from stand   Toilet Transfer: Minimal assistance;Ambulation;RW   Toileting- Clothing Manipulation and Hygiene: Minimal assistance;Sit to/from stand       Functional mobility during ADLs: Minimal assistance;Rolling walker General ADL Comments: educated pt on use of energy conservation techniques as pt reports feeling very weak and deconditioned                          Pertinent Vitals/Pain Pain Assessment: Faces Faces Pain Scale: Hurts even more Pain Location: R shoulder with ROM Pain Descriptors / Indicators: Discomfort;Grimacing;Sharp Pain Intervention(s): Limited activity within patient's tolerance;Monitored during session;Repositioned     Hand Dominance Right   Extremity/Trunk Assessment Upper Extremity Assessment Upper Extremity Assessment: RUE deficits/detail;Generalized weakness RUE Deficits / Details: limited shoulder ROM due to pain,  reports since her falls RUE: Unable to fully assess due to pain RUE Coordination: decreased gross motor   Lower Extremity Assessment Lower Extremity Assessment: Generalized weakness;Defer to PT evaluation        Communication Communication Communication: No difficulties   Cognition Arousal/Alertness: Awake/alert Behavior During Therapy: WFL for tasks assessed/performed Overall Cognitive Status: Within Functional Limits for tasks assessed                                     General Comments       Exercises Exercises: Other exercises Other Exercises Other Exercises: educated pt in lap slides, scapular retraction and hold as precursor to R shoulder exercise   Shoulder Instructions      Home Living Family/patient expects to be discharged to:: Private residence Living Arrangements: Alone(son staying right now but only for short time) Available Help at Discharge: Family;Available PRN/intermittently Type of Home: House Home Access: Stairs to enter [employer] of Steps: 2 Entrance Stairs-Rails: None Home Layout: One level     Bathroom Shower/Tub: Tub/shower unit;Walk-in shower   Bathroom Toilet: Standard     Home Equipment: None          Prior Functioning/Environment Level of Independence: Independent        Comments: performing iADL, driving        OT Problem List: Decreased strength;Decreased range of motion;Decreased activity tolerance;Decreased knowledge of use of DME or AE;Pain;Impaired UE functional use;Cardiopulmonary status limiting activity      OT Treatment/Interventions: Self-care/ADL training;Therapeutic exercise;DME and/or AE instruction;Energy conservation;Therapeutic activities;Patient/family education;Balance training    OT Goals(Current goals can be found in the care plan section) Acute Rehab OT Goals Patient Stated Goal: home, regain independence, less falls OT Goal Formulation: With patient Time For Goal Achievement: 09/17/18 Potential to Achieve Goals: Good  OT Frequency: Min 2X/week   Barriers to D/C:            Co-evaluation              AM-PAC OT "6 Clicks" Daily Activity     Outcome Measure Help from  another person eating meals?: None Help from another person taking care of personal grooming?: None Help from another person toileting, which includes using toliet, bedpan, or urinal?: A Little Help from another person bathing (including washing, rinsing, drying)?: A Little Help from another person to put on and taking off regular upper body clothing?: None Help from another person to put on and taking off regular lower body clothing?: A Little 6 Click Score: 21   End of Session Equipment Utilized During Treatment: Rolling walker;Oxygen Nurse Communication: Mobility status  Activity Tolerance: Patient tolerated treatment well Patient left: in bed;with call bell/phone within reach  OT Visit Diagnosis: Muscle weakness (generalized) (M62.81);History of falling (Z91.81);Pain Pain - Right/Left: Right Pain - part of body: Shoulder                Time: 9381-8299 OT Time Calculation (min): 25 min Charges:  OT General Charges $OT Visit: 1 Visit OT Evaluation $OT Eval Moderate Complexity: 1 Mod OT Treatments $Self Care/Home Management : 8-22 mins  Lou Cal, OT Supplemental Rehabilitation Services Pager [phone] Office [phone]   Raymondo Band 09/03/2018, 1:17 PM

## 2018-09-03 NOTE — Plan of Care (Signed)
  Problem: Education: Goal: Knowledge of General Education information will improve Description: Including pain rating scale, medication(s)/side effects and non-pharmacologic comfort measures Outcome: Progressing   Problem: Coping: Goal: Level of anxiety will decrease Outcome: Progressing   Problem: Pain Managment: Goal: General experience of comfort will improve Outcome: Progressing   

## 2018-09-04 LAB — CBC WITH DIFFERENTIAL/PLATELET
Abs Immature Granulocytes: 0.2 10*3/uL — ABNORMAL HIGH (ref 0.00–0.07)
Basophils Absolute: 0 10*3/uL (ref 0.0–0.1)
Basophils Relative: 0 %
Eosinophils Absolute: 0.5 10*3/uL (ref 0.0–0.5)
Eosinophils Relative: 6 %
HCT: 21.1 % — ABNORMAL LOW (ref 36.0–46.0)
Hemoglobin: 6.3 g/dL — CL (ref 12.0–15.0)
Immature Granulocytes: 2 %
Lymphocytes Relative: 24 %
Lymphs Abs: 2.2 10*3/uL (ref 0.7–4.0)
MCH: 27.4 pg (ref 26.0–34.0)
MCHC: 29.9 g/dL — ABNORMAL LOW (ref 30.0–36.0)
MCV: 91.7 fL (ref 80.0–100.0)
Monocytes Absolute: 1 10*3/uL (ref 0.1–1.0)
Monocytes Relative: 11 %
Neutro Abs: 5.2 10*3/uL (ref 1.7–7.7)
Neutrophils Relative %: 57 %
Platelets: 258 10*3/uL (ref 150–400)
RBC: 2.3 MIL/uL — ABNORMAL LOW (ref 3.87–5.11)
RDW: 17.6 % — ABNORMAL HIGH (ref 11.5–15.5)
WBC: 9.1 10*3/uL (ref 4.0–10.5)
nRBC: 0.2 % (ref 0.0–0.2)

## 2018-09-04 LAB — GLUCOSE, CAPILLARY
Glucose-Capillary: 90 mg/dL (ref 70–99)
Glucose-Capillary: 96 mg/dL (ref 70–99)

## 2018-09-04 LAB — RENAL FUNCTION PANEL
Albumin: 2.5 g/dL — ABNORMAL LOW (ref 3.5–5.0)
Anion gap: 10 (ref 5–15)
BUN: 14 mg/dL (ref 8–23)
CO2: 27 mmol/L (ref 22–32)
Calcium: 8.5 mg/dL — ABNORMAL LOW (ref 8.9–10.3)
Chloride: 101 mmol/L (ref 98–111)
Creatinine, Ser: 4 mg/dL — ABNORMAL HIGH (ref 0.44–1.00)
GFR calc Af Amer: 12 mL/min — ABNORMAL LOW (ref >60)
GFR calc non Af Amer: 11 mL/min — ABNORMAL LOW (ref >60)
Glucose, Bld: 93 mg/dL (ref 70–99)
Phosphorus: 2.3 mg/dL — ABNORMAL LOW (ref 2.5–4.6)
Potassium: 4.4 mmol/L (ref 3.5–5.1)
Sodium: 138 mmol/L (ref 135–145)

## 2018-09-04 MED ORDER — FUROSEMIDE 40 MG PO TABS: 40.0000 mg | ORAL_TABLET | ORAL | 0 refills | Status: DC

## 2018-09-04 MED ORDER — NIFEDIPINE ER 60 MG PO TB24: 60.0000 mg | ORAL_TABLET | Freq: Every day | ORAL | 0 refills | Status: DC

## 2018-09-04 NOTE — Progress Notes (Signed)
Patient selected [facility], notified Case Manager.

## 2018-09-04 NOTE — Progress Notes (Signed)
Received referral to assist with HHPT, OT, aide. Spoke to pt and discussed referral. Asked pt if she has a preference for a [place] agency. She stated that her doctor recommended [facility]. Asked pt if she has a preference for another agency if [facility] is not able to accept the referral. She reports that the CM provided her with a list of [facility] agencies, but she hasn't have time to review it and she is not ready to make a decision at this time. [facility] and they stated that they don't do PT or OT. Met with pt and she reports that she doesn't have a preference for another agency and she wants to discuss the [facility] agency with her son. Informed pt that I'm here until 5 pm to assist her. She reports that her son will be in today. Met with pt's nurse and charge nurse and notified them that pt is not ready to choose a [place] agency and she is waiting for her son to make a decision. Will continue to f/u.

## 2018-09-04 NOTE — Progress Notes (Signed)
SATURATION QUALIFICATIONS: (This note is used to comply with regulatory documentation for home oxygen)  Patient Saturations on Room Air at Rest = 83%  Patient Saturations on Room Air while Ambulating = NT due to desat considerably at rest  Patient Saturations on 6 Liters of oxygen while Ambulating = 88%  Please briefly explain why patient needs home oxygen:Pt needs O2 at rest and with activity to maintain sats.  Thanks.  [place] Pager:  [phone]  Office:  [phone]

## 2018-09-04 NOTE — Discharge Summary (Signed)
Physician Discharge Summary  Jaedynn Bohlken ATF:[id number] DOB: February 13, 1946 DOA: 08/30/2018  PCP: [name], MD  Admit date: 08/30/2018 Discharge date: 09/04/2018  Time spent: 37 minutes  Recommendations for Outpatient Follow-up:  1. Consider Iron infusions Per Nephro 2. New meds -Procardia 3. Needs oxygen on d/c--upto 6 liters with ambulation, 3 L at rest 4. 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)   Anemia of chronic kidney failure, stage 5 (HCC)   Chronic depression   Patient is Jehovah's Witness   Essential hypertension   Elevated brain natriuretic peptide (BNP) level   Anemia, chronic renal failure, stage 5 (HCC)   Heart failure ([place])   Discharge Condition: improved  Diet recommendation: renal  Filed Weights   09/03/18 1240 09/03/18 1545 09/03/18 2039  Weight: 92.8 kg 91.3 kg 91.8 kg    History of present illness:  [name] Jehovah's Witness-does not accept blood-baseline hemoglobin 5-6 range Recent M[facility] [place] ESRD, dialysis right renal catheter chest-left immature fistula-missed dialysis 6/26- Admit 6/29 DOE X1 week-she tells me she does not know what her EDW is and this has not been clarified given her recent start of dialysis Emergently dialyzed 3 L 6/29-nephrology consulted  Hospitalization complicated by shortness of breath inability to ambulate without severe diaphoresis and new oxygen requirement Slowly improving  Hospital Course:  Volume overload Hypoxic respiratory failure not high output failure, based on echo Echo as below Management deferred to nephrology re: meds etc Continues to desaturate but tolerance for exercise is improving-have encouraged her to continue to do the same Will d/c home on oxygen which is new for her ESRD Secondary hyperparathyroidism Renally  mediated + Jehovah's Witness anemia Aranesp/iron/vitamin D/binders as per nephrology Lasix TTSS  Labs as per them as OP--cc'd Dr. Justin Mend on d/c for follow up HTN urgency Labetalol 200 twice daily + addition of Procardia 60 at bedtime Jehovah's Witness The patient is at high risk for further decompensation given the fact that she does not accept blood Blood count showed hemobobin 6.3 on d/c Consultants:  Nephrology  Procedures:  Echo 7/1 IMPRESSIONS   1. The left ventricle has normal systolic function with an ejection fraction of 60-65%. The cavity size was normal. There is mild asymmetric left ventricular hypertrophy. Left ventricular diastolic Doppler parameters are consistent with impaired  relaxation. 2. Mild outflow tract obstruction with 30 mm gradient. 3. The right ventricle has normal systolic function. The cavity was normal. There is no increase in right ventricular wall thickness. 4. Left atrial size was mild-moderately dilated. 5. Right atrial size was mildly dilated. 6. The mitral valve is degenerative. Mild thickening of the mitral valve leaflet. Mild calcification of the mitral valve leaflet. There is mild mitral annular calcification present. Mitral valve regurgitation is moderate by color flow Doppler. 7. The aortic valve is tricuspid. Mild thickening of the aortic valve. Mild calcification of the aortic valve. No stenosis of the aortic valve. 8. There is evidence of moderate plaque in the ascending aorta.  Discharge Exam: Vitals:   09/04/18 0600 09/04/18 1001  BP: (!) 161/80 127/79  Pulse: 73 68  Resp: 16 18  Temp: 98.9 F (37.2 C) 98.5 F (36.9 C)  SpO2: 100% 100%    General: awake coherent in nad-mild pallor--some proptosis t eyes, no ict Cardiovascular: s1 s2 ? HSM Respiratory: clear no added sound no rales  no LE edema Pulses good abd soft nt nd  Neuro intact to motor  Discharge Instructions   Discharge Instructions    Diet - low sodium  heart healthy   Complete by: As directed    Discharge instructions   Complete by: As directed    Take your meds as indicated You can use a multivitamin you will equipment and oxygen Discuss with your primary MD and Kidney doc re: Iron stores and testing--they will do some tests for you Start low, go slow with activity--build up endurance slowly   Increase activity slowly   Complete by: As directed      Allergies as of 09/04/2018   No Known Allergies     Medication List    STOP taking these medications   acetaminophen 325 MG tablet Commonly known as: TYLENOL     TAKE these medications   atorvastatin 10 MG tablet Commonly known as: LIPITOR Take 10 mg by mouth at bedtime.   Fish Oil 1000 MG Caps Take by mouth.   furosemide 40 MG tablet Commonly known as: LASIX Take 1 tablet (40 mg total) by mouth every Tuesday, Thursday, Saturday, and "Sunday.   HYDROcodone-acetaminophen 5-325 MG tablet Commonly known as: NORCO/VICODIN Take 1 tablet by mouth every 8 (eight) hours.   labetalol 200 MG tablet Commonly known as: NORMODYNE Take 200 mg by mouth 2 (two) times daily.   multivitamin Tabs tablet Take 1 tablet by mouth at bedtime.   NIFEdipine 60 MG 24 hr tablet Commonly known as: ADALAT CC Take 1 tablet (60 mg total) by mouth at bedtime. What changed:   medication strength  how much to take   polyethylene glycol 17 g packet Commonly known as: MiraLax Take 17 g by mouth daily as needed for moderate constipation.   Turmeric 500 MG Caps Take 500 mg by mouth daily.   vitamin C 1000 MG tablet Take 1,000 mg by mouth 2 (two) times a day.            Durable Medical Equipment  (From admission, onward)         Start     Ordered   09/04/18 1047  For home use only DME Pulse oximeter  Once     09/04/18 1047   09/04/18 1047  For home use only DME 4 wheeled rolling walker with seat  Once    Question:  Patient needs a walker to treat with the following condition   Answer:  Patient is Jehovah's Witness   09/04/18 1047   09/04/18 1047  For home use only DME Tub bench  Once     09/04/18 1047   09/04/18 1047  For home use only DME 3 n 1  Once     09/04/18 1047   09/03/18 1644  For home use only DME oxygen  Once    Question Answer Comment  Length of Need 6 Months   Mode or (Route) Nasal cannula   Liters per Minute 2   Frequency Continuous (stationary and portable oxygen unit needed)   Oxygen conserving device Yes   Oxygen delivery system Gas      07" /03/20 1644         No Known Allergies    The results of significant diagnostics from this hospitalization (including imaging, microbiology, ancillary and laboratory) are listed below for reference.    Significant Diagnostic Studies: Dg Chest 2 View  Result Date: 09/02/2018 CLINICAL DATA:  Pneumonia EXAM: CHEST - 2 VIEW COMPARISON:  August 31, 2018, October 31, 2013 FINDINGS: The heart size and mediastinal contours are stable. Right central venous line is unchanged compared prior exam. The aorta is tortuous. Scarring of bilateral upper lobes are identified. Minimal bilateral posterior pleural effusions are identified. The visualized skeletal structures are stable. IMPRESSION: Minimal bilateral posterior pleural effusions. No focal pneumonia. Scarring of bilateral upper lobes. Electronically Signed   By: Abelardo Diesel M.D.   On: 09/02/2018 08:49   Dg Chest 2 View  Result Date: 08/31/2018 CLINICAL DATA:  Pneumonia. Chronic kidney disease. Diabetes. EXAM: CHEST - 2 VIEW COMPARISON:  08/30/2018 FINDINGS: The heart size and mediastinal contours are within normal limits. Right jugular dual-lumen central venous catheter remains in appropriate position. Atelectasis or infiltrate in posterior left lower lobe shows no significant change. Stable right upper lobe scarring. Probable small left pleural effusion noted on lateral view. IMPRESSION: No significant change in left lower lobe atelectasis versus infiltrate, and  probable small left pleural effusion. Electronically Signed   By: Marlaine Hind M.D.   On: 08/31/2018 08:48   Dg Chest 2 View  Result Date: 08/20/2018 CLINICAL DATA:  Syncope EXAM: CHEST - 2 VIEW COMPARISON:  10/31/2013 FINDINGS: Scarring and fibrosis within the right greater than left upper lobe as before. No acute consolidation or effusion. Stable cardiomediastinal silhouette with unfolded aorta. No pneumothorax. Degenerative changes of the spine. IMPRESSION: No active cardiopulmonary disease. Stable right greater than left upper lobe scarring and fibrosis. Electronically Signed   By: Donavan Foil M.D.   On: 08/20/2018 14:49   Dg Shoulder Right  Result Date: 09/02/2018 CLINICAL DATA:  Pain for 2 weeks after bowling.  No injury. EXAM: RIGHT SHOULDER - 2+ VIEW COMPARISON:  None. FINDINGS: There is scarring seen in the right upper lobe, better assessed on today's chest x-ray. No fracture or dislocation in the shoulder. No bony lesions identified. IMPRESSION: No cause for the patient's symptoms identified. Electronically Signed   By: Dorise Bullion III M.D   On: 09/02/2018 12:41   Ct Head Wo Contrast  Result Date: 08/20/2018 CLINICAL DATA:  Syncopal episode. EXAM: CT HEAD WITHOUT CONTRAST TECHNIQUE: Contiguous axial images were obtained from the base of the skull through the vertex without intravenous contrast. COMPARISON:  None. FINDINGS: Brain: The ventricles are in the midline without mass effect or shift. No extra-axial fluid collections are identified. Age related cerebral atrophy, ventriculomegaly and periventricular white matter disease. Remote appearing lacunar type right basal ganglia infarcts are noted. No CT findings for acute hemispheric infarction or intracranial hemorrhage. No mass lesions. The brainstem and cerebellum are unremarkable. 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 Vascular: Vascular calcifications but no definite aneurysm or hyperdense vessels. Skull: No skull fracture or bone lesions.  Sinuses/Orbits: The paranasal sinuses and mastoid air cells are clear. The globes are intact. Other: No scalp lesions or hematoma. IMPRESSION: 1. No CT findings for an acute intracranial process. 2. 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 3. Small midline lipomas. 4. Age related cerebral atrophy, ventriculomegaly and periventricular white matter disease. Electronically Signed   By: Marijo Sanes M.D.   On: 08/20/2018 14:22   Ir Fluoro Guide Cv Line Right  Result Date: 08/21/2018 INDICATION: 73 year old with end-stage renal disease and starting hemodialysis. EXAM: FLUOROSCOPIC AND ULTRASOUND GUIDED PLACEMENT OF A TUNNELED DIALYSIS CATHETER Physician: Stephan Minister. Anselm Pancoast, MD MEDICATIONS: Ancef 2 g; The antibiotic was administered within an appropriate time interval prior to skin puncture. ANESTHESIA/SEDATION: Versed 2.0 mg IV; Fentanyl 100 mcg IV; Moderate  Sedation Time:  20 minutes The patient was continuously monitored during the procedure by the interventional radiology nurse under my direct supervision. FLUOROSCOPY TIME:  Fluoroscopy Time: 36 seconds, 2 mGy COMPLICATIONS: None immediate. PROCEDURE: The procedure was explained to the patient. The risks and benefits of the procedure were discussed and the patient's questions were addressed. Informed consent was obtained from the patient. The patient was placed supine on the interventional table. Ultrasound confirmed a patent right internal jugular vein. Ultrasound images were obtained for documentation. The right neck and chest was prepped and draped in a sterile fashion. The right neck was anesthetized with 1% lidocaine. Maximal barrier sterile technique was utilized including caps, mask, sterile gowns, sterile gloves, sterile drape, hand hygiene and skin antiseptic. A small incision was made with #11 blade scalpel. A 21 gauge needle directed into the right internal jugular vein with ultrasound guidance. A micropuncture dilator  set was placed. A 19 cm tip to cuff Palindrome catheter was selected. The skin below the right clavicle was anesthetized and a small incision was made with an #11 blade scalpel. A subcutaneous tunnel was formed to the vein dermatotomy site. The catheter was brought through the tunnel. The vein dermatotomy site was dilated to accommodate a peel-away sheath. The catheter was placed through the peel-away sheath and directed into the central venous structures. The tip of the catheter was placed at the SVC and right atrium junction with fluoroscopy. Fluoroscopic images were obtained for documentation. Both lumens were found to aspirate and flush well. The proper amount of heparin was flushed in both lumens. The vein dermatotomy site was closed using a single layer of absorbable suture and Dermabond. Gel-Foam was placed in the subcutaneous tract. The catheter was secured to the skin using Prolene suture. IMPRESSION: Successful placement of a right jugular tunneled dialysis catheter using ultrasound and fluoroscopic guidance. Electronically Signed   By: Markus Daft M.D.   On: 08/21/2018 16:14   Ir US Guide Vasc Access Right  Result Date: 08/21/2018 INDICATION: 73 year old with end-stage renal disease and starting hemodialysis. EXAM: FLUOROSCOPIC AND ULTRASOUND GUIDED PLACEMENT OF A TUNNELED DIALYSIS CATHETER Physician: Stephan Minister. Anselm Pancoast, MD MEDICATIONS: Ancef 2 g; The antibiotic was administered within an appropriate time interval prior to skin puncture. ANESTHESIA/SEDATION: Versed 2.0 mg IV; Fentanyl 100 mcg IV; Moderate Sedation Time:  20 minutes The patient was continuously monitored during the procedure by the interventional radiology nurse under my direct supervision. FLUOROSCOPY TIME:  Fluoroscopy Time: 36 seconds, 2 mGy COMPLICATIONS: None immediate. PROCEDURE: The procedure was explained to the patient. The risks and benefits of the procedure were discussed and the patient's questions were addressed. Informed consent  was obtained from the patient. The patient was placed supine on the interventional table. Ultrasound confirmed a patent right internal jugular vein. Ultrasound images were obtained for documentation. The right neck and chest was prepped and draped in a sterile fashion. The right neck was anesthetized with 1% lidocaine. Maximal barrier sterile technique was utilized including caps, mask, sterile gowns, sterile gloves, sterile drape, hand hygiene and skin antiseptic. A small incision was made with #11 blade scalpel. A 21 gauge needle directed into the right internal jugular vein with ultrasound guidance. A micropuncture dilator set was placed. A 19 cm tip to cuff Palindrome catheter was selected. The skin below the right clavicle was anesthetized and a small incision was made with an #11 blade scalpel. A subcutaneous tunnel was formed to the vein dermatotomy site. The catheter was brought through the tunnel.  The vein dermatotomy site was dilated to accommodate a peel-away sheath. The catheter was placed through the peel-away sheath and directed into the central venous structures. The tip of the catheter was placed at the SVC and right atrium junction with fluoroscopy. Fluoroscopic images were obtained for documentation. Both lumens were found to aspirate and flush well. The proper amount of heparin was flushed in both lumens. The vein dermatotomy site was closed using a single layer of absorbable suture and Dermabond. Gel-Foam was placed in the subcutaneous tract. The catheter was secured to the skin using Prolene suture. IMPRESSION: Successful placement of a right jugular tunneled dialysis catheter using ultrasound and fluoroscopic guidance. Electronically Signed   By: Markus Daft M.D.   On: 08/21/2018 16:14   Dg Chest Port 1 View  Result Date: 08/30/2018 CLINICAL DATA:  Shortness of breath, chest pain, cough EXAM: PORTABLE CHEST 1 VIEW COMPARISON:  08/20/2018 FINDINGS: There is left basilar airspace disease.  There is a trace right pleural effusion. There is mild interstitial thickening. There is right apical scarring again noted. There is mild left apical scarring. There is no pneumothorax. The heart and mediastinal contours are unremarkable. There is a right jugular central venous catheter in satisfactory position. There is no acute osseous abnormality. IMPRESSION: 1. Hazy left lower lobe airspace disease which may reflect atelectasis versus pneumonia. 2. Mild interstitial thickening likely reflecting mild interstitial edema. Electronically Signed   By: Kathreen Devoid   On: 08/30/2018 10:29   Vas [place] Upper Ext Vein Mapping (pre-op Avf)  Result Date: 08/22/2018 UPPER EXTREMITY VEIN MAPPING  Indications: Pre-Dialysis access. Limitations: IV/bandages Comparison Study: No prior study on file for comparison Performing Technologist: Sharion Dove RVS  Examination Guidelines: A complete evaluation includes B-mode imaging, spectral Doppler, color Doppler, and power Doppler as needed of all accessible portions of each vessel. Bilateral testing is considered an integral part of a complete examination. Limited examinations for reoccurring indications may be performed as noted. +-----------------+-------------+----------+---------+ Right Cephalic   Diameter (cm)Depth (cm)Findings  +-----------------+-------------+----------+---------+ Prox upper arm       0.46        0.61             +-----------------+-------------+----------+---------+ Mid upper arm        0.45        0.33             +-----------------+-------------+----------+---------+ Dist upper arm       0.43        0.29             +-----------------+-------------+----------+---------+ Antecubital fossa    0.63        0.14             +-----------------+-------------+----------+---------+ Prox forearm         0.39        0.24   branching +-----------------+-------------+----------+---------+ Mid forearm          0.41        0.24    branching +-----------------+-------------+----------+---------+ Wrist                0.31        0.20             +-----------------+-------------+----------+---------+ +-----------------+-------------+----------+--------------+ Left Cephalic    Diameter (cm)Depth (cm)   Findings    +-----------------+-------------+----------+--------------+ Shoulder  not visualized +-----------------+-------------+----------+--------------+ Prox upper arm                          not visualized +-----------------+-------------+----------+--------------+ Mid upper arm                           not visualized +-----------------+-------------+----------+--------------+ Dist upper arm       0.41        1.53                  +-----------------+-------------+----------+--------------+ Antecubital fossa    0.36        3.44                  +-----------------+-------------+----------+--------------+ Prox forearm                            not visualized +-----------------+-------------+----------+--------------+ Mid forearm                             not visualized +-----------------+-------------+----------+--------------+ Wrist                                   not visualized +-----------------+-------------+----------+--------------+ +-----------------+-------------+----------+---------+ Left Basilic     Diameter (cm)Depth (cm)Findings  +-----------------+-------------+----------+---------+ Prox upper arm       0.44        1.55             +-----------------+-------------+----------+---------+ Mid upper arm        0.49        1.01             +-----------------+-------------+----------+---------+ Dist upper arm       0.49        0.89   branching +-----------------+-------------+----------+---------+ Antecubital fossa    0.44        0.90             +-----------------+-------------+----------+---------+ Prox forearm         0.46         1.19   branching +-----------------+-------------+----------+---------+ Mid forearm          0.42        1.46             +-----------------+-------------+----------+---------+ Wrist                0.18        0.13   branching +-----------------+-------------+----------+---------+ *See table(s) above for measurements and observations.  Diagnosing physician: Monica Martinez MD Electronically signed by Monica Martinez MD on 08/22/2018 at 3:10:20 PM.    Final     Microbiology: Recent Results (from the past 240 hour(s))  MRSA PCR Screening     Status: None   Collection Time: 08/25/18 10:35 PM   Specimen: Nasal Mucosa; Nasopharyngeal  Result Value Ref Range Status   MRSA by PCR NEGATIVE NEGATIVE Final    Comment:        The GeneXpert MRSA Assay (FDA approved for NASAL specimens only), is one component of a comprehensive MRSA colonization surveillance program. It is not intended to diagnose MRSA infection nor to guide or monitor treatment for MRSA infections. Performed at [facility], [place] [address], [place], [place] [address]   SARS Coronavirus 2 (CEPHEID- Performed in [facility] hospital lab), [facility]  Status: None   Collection Time: 08/30/18 10:05 AM   Specimen: Nasopharyngeal Swab  Result Value Ref Range Status   SARS Coronavirus 2 NEGATIVE NEGATIVE Final    Comment: (NOTE) If result is NEGATIVE SARS-CoV-2 target nucleic acids are NOT DETECTED. The SARS-CoV-2 RNA is generally detectable in upper and lower  respiratory specimens during the acute phase of infection. The lowest  concentration of SARS-CoV-2 viral copies this assay can detect is 250  copies / mL. A negative result does not preclude SARS-CoV-2 infection  and should not be used as the sole basis for treatment or other  patient management decisions.  A negative result may occur with  improper specimen collection / handling, submission of specimen other  than nasopharyngeal swab,  presence of viral mutation(s) within the  areas targeted by this assay, and inadequate number of viral copies  (<250 copies / mL). A negative result must be combined with clinical  observations, patient history, and epidemiological information. If result is POSITIVE SARS-CoV-2 target nucleic acids are DETECTED. The SARS-CoV-2 RNA is generally detectable in upper and lower  respiratory specimens dur ing the acute phase of infection.  Positive  results are indicative of active infection with SARS-CoV-2.  Clinical  correlation with patient history and other diagnostic information is  necessary to determine patient infection status.  Positive results do  not rule out bacterial infection or co-infection with other viruses. If result is PRESUMPTIVE POSTIVE SARS-CoV-2 nucleic acids MAY BE PRESENT.   A presumptive positive result was obtained on the submitted specimen  and confirmed on repeat testing.  While 2019 novel coronavirus  (SARS-CoV-2) nucleic acids may be present in the submitted sample  additional confirmatory testing may be necessary for epidemiological  and / or clinical management purposes  to differentiate between  SARS-CoV-2 and other Sarbecovirus currently known to infect humans.  If clinically indicated additional testing with an alternate test  methodology [phone]) is advised. The SARS-CoV-2 RNA is generally  detectable in upper and lower respiratory sp ecimens during the acute  phase of infection. The expected result is Negative. Fact Sheet for Patients:  StrictlyIdeas.no Fact Sheet for Healthcare Providers: BankingDealers.co.za This test is not yet approved or cleared by the [place] FDA and has been authorized for detection and/or diagnosis of SARS-CoV-2 by FDA under an Emergency Use Authorization (EUA).  This EUA will remain in effect (meaning this test can be used) for the duration of the COVID-19 declaration under  Section 564(b)(1) of the Act, 21 U.S.C. section 360bbb-3(b)(1), unless the authorization is terminated or revoked sooner. Performed at [facility], [place] [address], [place], [place] [address]      Labs: Basic Metabolic Panel: Recent Labs  Lab 08/30/18 1005 08/30/18 1047 08/31/18 0603 09/01/18 0539 09/02/18 0524 09/04/18 0428  NA 136 133* 135 138 136 138  K 4.6 4.4 3.4* 3.3* 3.8 4.4  CL 99 99 97* 97* 99 101  CO2 21*  --  26 25 27 27   GLUCOSE 138* 133* 106* 96 101* 93  BUN 52* 51* 15 29* 21 14  CREATININE 9.00* 9.40* 4.46* 6.57* 4.64* 4.00*  CALCIUM 8.9  --  8.1* 8.3* 8.3* 8.5*  PHOS  --   --   --  3.0 2.4* 2.3*   Liver Function Tests: Recent Labs  Lab 09/01/18 0539 09/02/18 0524 09/04/18 0428  ALBUMIN 2.3* 2.5* 2.5*   No results for input(s): LIPASE, AMYLASE in the last 168 hours. No results for input(s): AMMONIA in the  last 168 hours. CBC: Recent Labs  Lab 08/30/18 1005 08/30/18 1047 08/31/18 0603 09/01/18 0539 09/02/18 0524 09/04/18 0428  WBC 10.4  --  9.1 10.1 9.8 9.1  NEUTROABS 7.9*  --   --  7.4 6.5 5.2  HGB 6.3* 7.5* 6.0* 5.8* 5.8* 6.3*  HCT 21.3* 22.0* 19.8* 19.6* 19.5* 21.1*  MCV 91.4  --  88.8 89.9 91.1 91.7  PLT 246  --  220 224 243 258   Cardiac Enzymes: No results for input(s): CKTOTAL, CKMB, CKMBINDEX, TROPONINI in the last 168 hours. BNP: BNP (last 3 results) Recent Labs    08/30/18 1005  BNP 1,864.9*    ProBNP (last 3 results) No results for input(s): PROBNP in the last 8760 hours.  CBG: Recent Labs  Lab 09/03/18 0708 09/03/18 1125 09/03/18 1627 09/03/18 2040 09/04/18 0642  GLUCAP 84 89 112* 108* 90       Signed:  Nita Sells MD   [facility] 09/04/2018, 11:02 AM

## 2018-09-04 NOTE — Progress Notes (Signed)
Received call from the charge nurse. She reports that pt selected [facility] for [facility] services. Contacted Melissa at [facility] for referral and she accepted the referral. Informed Melissa that pt has been D/C today. Notified pt that [facility] will f/u for [facility] therapy and an aide.

## 2018-09-04 NOTE — Progress Notes (Signed)
Occupational Therapy Treatment Patient Details Name: Bianca Myers MRN: [id number] DOB: [birth year] Today's Date: 09/04/2018    History of present illness 73 y.o. female with past medical history significant for stage V renal failure, (initiated on dialysis last hospitalization stay), hypertension and significant anemia who presents with weakness and dyspnea. Of note pt with recent admit, discharged on 6/26.    OT comments  Pt slowly progressing toward OT goals with some SOB observed during session. Pt demonstrated Min guard simulated toilet transfer from bed to chair. O2 sats on 6LO2 with 99% in sitting, 98% during transfer, and 98% sitting EOB. Pt educated of pursed lip technique, energy conservation, and importance of HHOT to ensure to safe use of AE in home environment. DC and freq remains the same OT will continue to follow acutely.    Follow Up Recommendations  Home health OT;Supervision/Assistance - 24 hour(24hr initially)    Equipment Recommendations  3 in 1 bedside commode;Other (comment)(RW)    Recommendations for Other Services      Precautions / Restrictions Precautions Precautions: Fall Precaution Comments: Reports x3 falls in past 3 weeks; anemia, per chart pt is Jehovah's Witness and declines blood transfusion.  Restrictions Weight Bearing Restrictions: No       Mobility Bed Mobility Overal bed mobility: Needs Assistance Bed Mobility: Supine to Sit;Sit to Supine     Supine to sit: Supervision Sit to supine: Supervision   General bed mobility comments: pt received sitting up on EOB upon arrival.   Transfers Overall transfer level: Needs assistance Equipment used: Rolling walker (2 wheeled) Transfers: Sit to/from Stand Sit to Stand: Min guard         General transfer comment: cuing for hand placement and teaching of how to use the rollator    Balance Overall balance assessment: Needs assistance Sitting-balance support: Feet supported;No upper  extremity supported Sitting balance-Leahy Scale: Good     Standing balance support: Bilateral upper extremity supported;During functional activity Standing balance-Leahy Scale: Poor Standing balance comment: reliant on UE support                            ADL either performed or assessed with clinical judgement   ADL Overall ADL's : Needs assistance/impaired                         Toilet Transfer: Ambulation;RW;Min guard Toilet Transfer Details (indicate cue type and reason): simulated toilet transfer from bed to chair at EOB.          Functional mobility during ADLs: Rolling walker;Min guard General ADL Comments: educated pt on use of energy conservation techniques while in home environment to prevent over exertion and SOB. Pt observed to have some SOB during simulated transfer.      Vision       Perception     Praxis      Cognition Arousal/Alertness: Awake/alert Behavior During Therapy: WFL for tasks assessed/performed Overall Cognitive Status: Within Functional Limits for tasks assessed                                          Exercises     Shoulder Instructions       General Comments      Pertinent Vitals/ Pain       Pain Assessment: Faces Faces Pain Scale: Hurts a  little bit Pain Location: R shoulder with ROM Pain Descriptors / Indicators: Discomfort;Grimacing;Sharp Pain Intervention(s): Monitored during session;Repositioned  Home Living Family/patient expects to be discharged to:: Private residence Living Arrangements: Alone(son staying right now but only for short time) Available Help at Discharge: Family;Available PRN/intermittently Type of Home: House Home Access: Stairs to enter [employer] of Steps: 2 Entrance Stairs-Rails: None Home Layout: One level     Bathroom Shower/Tub: Tub/shower unit;Walk-in shower   Bathroom Toilet: Standard     Home Equipment: None          Prior  Functioning/Environment Level of Independence: Independent        Comments: performing iADL, driving   Frequency  Min 2X/week        Progress Toward Goals  OT Goals(current goals can now be found in the care plan section)  Progress towards OT goals: Progressing toward goals  Acute Rehab OT Goals Patient Stated Goal: home, regain independence, less falls OT Goal Formulation: With patient Time For Goal Achievement: 09/17/18 Potential to Achieve Goals: Good ADL Goals Pt Will Perform Grooming: with modified independence;standing Pt Will Perform Lower Body Bathing: with modified independence;sit to/from stand Pt Will Perform Lower Body Dressing: with modified independence;sit to/from stand Pt Will Transfer to Toilet: with modified independence;ambulating Pt Will Perform Toileting - Clothing Manipulation and hygiene: with modified independence;sit to/from stand Additional ADL Goal #1: Pt will independently verbalize/demonstrate at least 3 energy conservation techniques to use during functional task. Additional ADL Goal #2: Pt will identify at least 3 ways to reduce risk for falls.  Plan Discharge plan remains appropriate;Frequency remains appropriate    Co-evaluation                 AM-PAC OT "6 Clicks" Daily Activity     Outcome Measure   Help from another person eating meals?: None Help from another person taking care of personal grooming?: None Help from another person toileting, which includes using toliet, bedpan, or urinal?: A Little Help from another person bathing (including washing, rinsing, drying)?: A Little Help from another person to put on and taking off regular upper body clothing?: None Help from another person to put on and taking off regular lower body clothing?: A Little 6 Click Score: 21    End of Session Equipment Utilized During Treatment: Rolling walker;Oxygen  OT Visit Diagnosis: Muscle weakness (generalized) (M62.81);History of falling  (Z91.81);Pain Pain - Right/Left: Right Pain - part of body: Shoulder   Activity Tolerance Patient tolerated treatment well   Patient Left in bed;with call bell/phone within reach   Nurse Communication Mobility status        Time: 4034-7425 OT Time Calculation (min): 15 min  Charges: OT General Charges $OT Visit: 1 Visit OT Treatments $Self Care/Home Management : 8-22 mins  Minus Breeding, MSOT, OTR/L  [facility]  [phone]    Marius Ditch 09/04/2018, 11:39 AM

## 2018-09-04 NOTE — Progress Notes (Addendum)
I have seen and examined this patient and agree with the plan of care  Sherril Croon 09/04/2018, 10:36 AM  [facility] Progress Note   Subjective:   Patient seen and examined at bedside.  States she is doing well, hoping to go home today.  No new complaints.  Reports slight improvement in anxiety yesterday through prayer.   Objective Vitals:   09/03/18 1606 09/03/18 1626 09/03/18 2039 09/04/18 0600  BP:  (!) 185/87 (!) 168/85 (!) 161/80  Pulse: 70 75 70 73  Resp:  18 16 16   Temp:  98.5 F (36.9 C) 99.5 F (37.5 C) 98.9 F (37.2 C)  TempSrc:  Oral Oral Oral  SpO2:  98% 96% 100%  Weight:   91.8 kg    Physical Exam General:NAD, pleasant female Heart:RRR, no mrg Lungs:CTAB Abdomen:+BS, NTND Extremities:no LE edema Dialysis Access: R IJ TDC, LU AVF maturing +b   Filed Weights   09/03/18 1240 09/03/18 1545 09/03/18 2039  Weight: 92.8 kg 91.3 kg 91.8 kg    Intake/Output Summary (Last 24 hours) at 09/04/2018 0950 Last data filed at 09/04/2018 0600 Gross per 24 hour  Intake 480 ml  Output 1500 ml  Net -1020 ml    Additional Objective Labs: Basic Metabolic Panel: Recent Labs  Lab 09/01/18 0539 09/02/18 0524 09/04/18 0428  NA 138 136 138  K 3.3* 3.8 4.4  CL 97* 99 101  CO2 25 27 27   GLUCOSE 96 101* 93  BUN 29* 21 14  CREATININE 6.57* 4.64* 4.00*  CALCIUM 8.3* 8.3* 8.5*  PHOS 3.0 2.4* 2.3*   Liver Function Tests: Recent Labs  Lab 09/01/18 0539 09/02/18 0524 09/04/18 0428  ALBUMIN 2.3* 2.5* 2.5*   CBC: Recent Labs  Lab 08/30/18 1005  08/31/18 0603 09/01/18 0539 09/02/18 0524 09/04/18 0428  WBC 10.4  --  9.1 10.1 9.8 9.1  NEUTROABS 7.9*  --   --  7.4 6.5 5.2  HGB 6.3*   < > 6.0* 5.8* 5.8* 6.3*  HCT 21.3*   < > 19.8* 19.6* 19.5* 21.1*  MCV 91.4  --  88.8 89.9 91.1 91.7  PLT 246  --  220 224 243 258   < > = values in this interval not displayed.   CBG: Recent Labs  Lab 09/03/18 0708 09/03/18 1125 09/03/18 1627 09/03/18 2040  09/04/18 0642  GLUCAP 84 89 112* 108* 90    Lab Results  Component Value Date   INR 1.2 08/24/2018   INR 1.1 08/21/2018   Studies/Results: Dg Shoulder Right  Result Date: 09/02/2018 CLINICAL DATA:  Pain for 2 weeks after bowling.  No injury. EXAM: RIGHT SHOULDER - 2+ VIEW COMPARISON:  None. FINDINGS: There is scarring seen in the right upper lobe, better assessed on today's chest x-ray. No fracture or dislocation in the shoulder. No bony lesions identified. IMPRESSION: No cause for the patient's symptoms identified. Electronically Signed   By: Dorise Bullion III M.D   On: 09/02/2018 12:41    Medications: . ferric gluconate (FERRLECIT/NULECIT) IV 125 mg (09/03/18 1441)   . atorvastatin  10 mg Oral QHS  . Chlorhexidine Gluconate Cloth  6 each Topical Q0600  . Chlorhexidine Gluconate Cloth  6 each Topical Q0600  . darbepoetin (ARANESP) injection - DIALYSIS  200 mcg Intravenous Q Fri-HD  . feeding supplement (PRO-STAT SUGAR FREE 64)  30 mL Oral BID  . furosemide  40 mg Oral Q T,Th,S,Su  . heparin  5,000 Units Subcutaneous Q8H  . HYDROcodone-acetaminophen  1 tablet Oral Q8H  . labetalol  200 mg Oral BID  . multivitamin  1 tablet Oral QHS  . NIFEdipine  60 mg Oral QHS    Dialysis Orders: [facility] MWF 4 hr 180 NRe 400/800 81.5 kg 3.0 K/2.25 Ca -No heparin  -Venofer 100 mg IV X 4 (not started yet) -Venofer 100 mg IV weekly (not started yet)  Assessment/Plan: 1. DOE - likely d/t symptomatic anemia.  Case Manager working on getting home O2.  Per primary.  2. Volume overload - resolved with HD.  Started on furosemide on non HD days.  Weights do not appear accurate, doubtful she gained 10kg in 5 days.  Order standing weight.  Likely needs EDW lowered on d/c. 2. ESRD - HD MWF.  K 4.4 on 4K bath as IP.  HD yesterday tolerated well.  Next HD on 7/6.  Some improvement in anxiety during treatment yesterday. 3. Anemia of CKD- Hgb 5.8>6.3, refusing transfusion b/c Jehovah's Witness. Continue Fe  load and aranesp 263mcg qwk.  4. Secondary hyperparathyroidism - phos 2.3, No binder, diet liberalized. CCa 9.7 5. HTN - BP improved. Nifedipine increased to 60mg  PO qd during stay.  Continue furosemide 40mg  on non HD days.  6. Nutrition - Liberalized diet d/t low phos and K  Jen Mow, PA-C [facility] Pager: [phone] 09/04/2018,9:50 AM  LOS: 4 days

## 2018-09-04 NOTE — Progress Notes (Addendum)
Bianca Myers to be discharged Home per MD order. Discussed prescriptions and follow up appointments with the patient. Prescriptions explained to patient; medication list explained in detail. Patient verbalized understanding.  Skin clean, dry and intact without evidence of skin break down, no evidence of skin tears noted. No complaints noted.  Patient free of lines, drains, and wounds.   An After Visit Summary (AVS) was printed and given to the patient. Patient escorted via wheelchair, and discharged home via private auto.  Amaryllis Dyke, RN

## 2018-09-04 NOTE — Evaluation (Signed)
Physical Therapy Evaluation Patient Details Name: Bianca Myers MRN: [id number] DOB: 04-08-1945 Today's Date: 09/04/2018   History of Present Illness  73 y.o. female with past medical history significant for stage V renal failure, (initiated on dialysis last hospitalization stay), hypertension and significant anemia who presents with weakness and dyspnea. Of note pt with recent admit, discharged on 6/26.   Clinical Impression  Pt admitted with above diagnosis. Pt currently with functional limitations due to the deficits listed below (see PT Problem List). Pt was able to ambulate with rollator with supervision.  Pt on 3LO2 at rest to maintain sats.  Pt needed 6LO2 to maintain sats in mid to upper 80's with activity.  Had to have frequent seated rest breaks due to desat with activity.  Pt aware of how to use rollator and that she needs to rest frequently.  [place] services to assist pt in the home as well.  Pt will benefit from skilled PT to increase their independence and safety with mobility to allow discharge to the venue listed below.    SATURATION QUALIFICATIONS: (This note is used to comply with regulatory documentation for home oxygen)  Patient Saturations on Room Air at Rest = 83%  Patient Saturations on Room Air while Ambulating = NT due to desat considerably at rest  Patient Saturations on 6 Liters of oxygen while Ambulating = 88%  Please briefly explain why patient needs home oxygen:Pt needs O2 at rest and with activity to maintain sats.  Follow Up Recommendations Home health PT;Supervision - Intermittent(HHOT)    Equipment Recommendations  3in1 (PT)(rollator, tub bench, pulse oximeter, home O2)    Recommendations for Other Services       Precautions / Restrictions Precautions Precautions: Fall Precaution Comments: Reports x3 falls in past 3 weeks; anemia, per chart pt is Jehovah's Witness and declines blood transfusion.  Restrictions Weight Bearing Restrictions: No       Mobility  Bed Mobility Overal bed mobility: Needs Assistance Bed Mobility: Supine to Sit;Sit to Supine     Supine to sit: Supervision Sit to supine: Supervision      Transfers Overall transfer level: Needs assistance Equipment used: Rolling walker (2 wheeled) Transfers: Sit to/from Stand Sit to Stand: Supervision         General transfer comment: cuing for hand placement and teaching of how to use the rollator  Ambulation/Gait Ambulation/Gait assistance: Supervision Gait Distance (Feet): 300 Feet(100 feet x 3) Assistive device: 4-wheeled walker Gait Pattern/deviations: Step-through pattern;Decreased stride length   Gait velocity interpretation: <1.31 ft/sec, indicative of household ambulator General Gait Details: Pt was able to ambulate with rollator with supervision.  Pt does need O2 as she desats on RA and with activity.  Pt was on 3L on arrival with sats 93%.  Pt to 83% on RA at rest.  Pt needed 6LO2 with activity and had to have sitting rest breaks due to DOE 3/4 after 100 feet of ambulation.  Taught pt how to use the rollator to rest locking brakes etc.  Pt educated on technique for sit to stand as well.  Pt very appreciative.   Stairs            Wheelchair Mobility    Modified Rankin (Stroke Patients Only)       Balance Overall balance assessment: Needs assistance Sitting-balance support: Feet supported;No upper extremity supported Sitting balance-Leahy Scale: Good     Standing balance support: Bilateral upper extremity supported;During functional activity Standing balance-Leahy Scale: Poor Standing balance comment: reliant on UE  support                              Pertinent Vitals/Pain Pain Assessment: Faces Faces Pain Scale: Hurts even more Pain Location: R shoulder with ROM Pain Descriptors / Indicators: Discomfort;Grimacing;Sharp Pain Intervention(s): Limited activity within patient's tolerance;Monitored during  session;Repositioned    Home Living Family/patient expects to be discharged to:: Private residence Living Arrangements: Alone(son staying right now but only for short time) Available Help at Discharge: Family;Available PRN/intermittently Type of Home: House Home Access: Stairs to enter Entrance Stairs-Rails: None Entrance Stairs-Number of Steps: 2 Home Layout: One level Home Equipment: None      Prior Function Level of Independence: Independent         Comments: performing iADL, driving     Hand Dominance   Dominant Hand: Right    Extremity/Trunk Assessment   Upper Extremity Assessment Upper Extremity Assessment: Defer to OT evaluation    Lower Extremity Assessment Lower Extremity Assessment: Generalized weakness       Communication   Communication: No difficulties  Cognition Arousal/Alertness: Awake/alert Behavior During Therapy: WFL for tasks assessed/performed Overall Cognitive Status: Within Functional Limits for tasks assessed                                        General Comments      Exercises     Assessment/Plan    PT Assessment Patient needs continued PT services  PT Problem List Decreased mobility;Decreased coordination;Decreased balance;Decreased activity tolerance;Decreased knowledge of use of DME;Decreased safety awareness;Decreased knowledge of precautions;Cardiopulmonary status limiting activity       PT Treatment Interventions DME instruction;Gait training;Functional mobility training;Therapeutic activities;Therapeutic exercise;Balance training;Patient/family education    PT Goals (Current goals can be found in the Care Plan section)  Acute Rehab PT Goals Patient Stated Goal: home, regain independence, less falls PT Goal Formulation: With patient Time For Goal Achievement: 09/18/18 Potential to Achieve Goals: Good    Frequency Min 3X/week   Barriers to discharge        Co-evaluation                AM-PAC PT "6 Clicks" Mobility  Outcome Measure Help needed turning from your back to your side while in a flat bed without using bedrails?: None Help needed moving from lying on your back to sitting on the side of a flat bed without using bedrails?: None Help needed moving to and from a bed to a chair (including a wheelchair)?: None Help needed standing up from a chair using your arms (e.g., wheelchair or bedside chair)?: None Help needed to walk in hospital room?: A Little Help needed climbing 3-5 steps with a railing? : A Little 6 Click Score: 22    End of Session Equipment Utilized During Treatment: Gait belt;Oxygen Activity Tolerance: Patient limited by fatigue Patient left: with call bell/phone within reach;in bed Nurse Communication: Mobility status PT Visit Diagnosis: Unsteadiness on feet (R26.81);Muscle weakness (generalized) (M62.81)    Time: 3419-6222 PT Time Calculation (min) (ACUTE ONLY): 39 min   Charges:   PT Evaluation $PT Eval Moderate Complexity: 1 Mod PT Treatments $Gait Training: 23-37 mins        [place] Pager:  [phone]  Office:  [place] 09/04/2018, 11:06 AM

## 2018-09-04 NOTE — Plan of Care (Signed)
Refused pro-stat.

## 2018-09-04 NOTE — Accreditation Note (Signed)
DISCHARGE NOTE HOME Lenore Moyano to be discharged home per MD order. Discussed prescriptions and follow up appointments with the patient. Prescriptions given to patient; medication list explained in detail. Patient verbalized understanding.  Skin clean, dry and intact without evidence of skin break down, no evidence of skin tears noted. IV catheter discontinued intact. Site without signs and symptoms of complications. Dressing and pressure applied. Pt denies pain at the site currently. No complaints noted.  Patient free of lines, drains, and wounds.   An After Visit Summary (AVS) was printed and given to the patient. Patient escorted via wheelchair, and discharged home via private auto.  [place], Zenon Mayo, RN

## 2018-09-21 NOTE — Progress Notes (Signed)
Received message from patient that she was discharged 09/04/18 and her "equipment" has yet to be delivered. Noted recommendations for oxygen, 3N1, and rollator. Left messages for patient with CM contact information on home and mobile numbers. Spoke with Thedore Mins at [place] - hospital liaison - who was able to confirm that patient did receive TOC, however, [employer] had not been able to contact her for delivery of the concentrator. Provided PCP - Salvadore Oxford, MD in [place] -  contact information in order to request orders for DME.   Manya Silvas, RN CCM Transitions of Care [phone]

## 2018-10-08 ENCOUNTER — Encounter (HOSPITAL_COMMUNITY): Payer: Medicare HMO

## 2018-10-11 ENCOUNTER — Other Ambulatory Visit: Payer: Self-pay

## 2018-10-11 DIAGNOSIS — N184 Chronic kidney disease, stage 4 (severe): Secondary | ICD-10-CM

## 2018-10-13 ENCOUNTER — Ambulatory Visit (INDEPENDENT_AMBULATORY_CARE_PROVIDER_SITE_OTHER): Payer: Self-pay | Admitting: Physician Assistant

## 2018-10-13 ENCOUNTER — Ambulatory Visit (HOSPITAL_COMMUNITY)
Admission: RE | Admit: 2018-10-13 | Discharge: 2018-10-13 | Disposition: A | Discharge status: Home / Self Care | Payer: Medicare HMO | Source: Ambulatory Visit | Attending: Family | Admitting: Family

## 2018-10-13 ENCOUNTER — Other Ambulatory Visit: Payer: Self-pay

## 2018-10-13 ENCOUNTER — Other Ambulatory Visit: Payer: Self-pay | Admitting: *Deleted

## 2018-10-13 ENCOUNTER — Encounter: Payer: Self-pay | Admitting: *Deleted

## 2018-10-13 VITALS — BP 112/58 | HR 70 | Temp 97.8°F | Resp 14 | Ht 66.0 in | Wt 202.0 lb

## 2018-10-13 DIAGNOSIS — Z992 Dependence on renal dialysis: Secondary | ICD-10-CM

## 2018-10-13 DIAGNOSIS — N184 Chronic kidney disease, stage 4 (severe): Secondary | ICD-10-CM | POA: Diagnosis present

## 2018-10-13 DIAGNOSIS — N186 End stage renal disease: Secondary | ICD-10-CM

## 2018-10-13 NOTE — Progress Notes (Signed)
  POST OPERATIVE OFFICE NOTE    CC:  F/u for surgery  HPI:  This is a 73 y.o. female who is s/p Left arm brachial artery to basilic vein AV fistula creation.  She is here for f/u and to plan second stage basilic transposition.  She denise pain, loss of sensation and loss of motor in the left UE.    No Known Allergies  Current Outpatient Medications  Medication Sig Dispense Refill  . Ascorbic Acid (VITAMIN C) 1000 MG tablet Take 1,000 mg by mouth 2 (two) times a day.     Marland Kitchen atorvastatin (LIPITOR) 10 MG tablet Take 10 mg by mouth at bedtime.    . furosemide (LASIX) 40 MG tablet Take 1 tablet (40 mg total) by mouth every Tuesday, Thursday, Saturday, and Sunday. 30 tablet 0  . labetalol (NORMODYNE) 200 MG tablet Take 200 mg by mouth 2 (two) times daily.    . multivitamin (RENA-VIT) TABS tablet Take 1 tablet by mouth at bedtime. 30 tablet 0  . NIFEdipine (ADALAT CC) 60 MG 24 hr tablet Take 1 tablet (60 mg total) by mouth at bedtime. 30 tablet 0  . Omega-3 Fatty Acids (FISH OIL) 1000 MG CAPS Take by mouth.    . polyethylene glycol (MIRALAX) 17 g packet Take 17 g by mouth daily as needed for moderate constipation. 14 each 0  . Turmeric 500 MG CAPS Take 500 mg by mouth daily.      No current facility-administered medications for this visit.      ROS:  See HPI  Physical Exam:    +------------+----------+-------------+----------+-----------------+ OUTFLOW VEINPSV (cm/s)Diameter (cm)Depth (cm)    Describe      +------------+----------+-------------+----------+-----------------+ Prox UA        165        0.73        1.74                     +------------+----------+-------------+----------+-----------------+ Mid UA         199        0.58        1.74                     +------------+----------+-------------+----------+-----------------+ Dist UA        230        0.58        1.27   competing branch  +------------+----------+-------------+----------+-----------------+  AC Fossa       87 6        0.29        0.84   homogenous plaque +------------+----------+-------------+----------+-----------------+       Summary: Patent left brachial artery to basilic vein fistula with increased velocity in the anastamosis at the antecubital fossa and outflow vein with visualized soft plaque with narrowing.   Incision:  Well healed Extremities:  Grip 5/5, sensation intact and palpable radial pulse.  Palpable thrill at anastomosis of fistula. Working right [facility]  Assessment/Plan:  This is a 73 y.o. female who is s/p: Well maturing left brachial Basilic fistula with diameter> 0.5 mm.  There is  increased velocity in the anastamosis at the antecubital fossa and outflow vein with visualized soft plaque with narrowing. She will be scheduled for second stage transposition of the fistula.  HD days MWF.       Roxy Horseman PA-C [facility] [phone]  Clinic MD:  Oneida Alar

## 2018-10-25 ENCOUNTER — Other Ambulatory Visit: Payer: Self-pay | Admitting: *Deleted

## 2018-11-02 ENCOUNTER — Other Ambulatory Visit: Payer: Self-pay

## 2018-11-02 ENCOUNTER — Encounter (HOSPITAL_COMMUNITY): Payer: Self-pay | Admitting: *Deleted

## 2018-11-02 NOTE — Progress Notes (Signed)
Spoke with pt for pre-op call. Pt denies cardiac history. Pt is a type 2 diabetic. Pt states she doesn't check her blood sugar and doesn't know when or what her last A1C was. She states the dialysis center would have it. She states she goes to [facility] on [address] in [place]. I have faxed a request for most recent A1C to sent to [place].  Pt is going to try and come tomorrow after dialysis to get her Covid test done. She is not sure if she can find a ride. I instructed to go tomorrow if she can find a ride, but if not to arrive at  7:00 AM on Thursday instead of 7:30 AM to be tested on arrival. Pt voiced understanding.   Coronavirus Screening  Have you experienced the following symptoms:  Cough yes/no: No Fever (>100.43F)  yes/no: No Runny nose yes/no: No Sore throat yes/no: No Difficulty breathing/shortness of breath  yes/no: No  Have you or a family member traveled in the last 14 days and where? yes/no: No  Patient reminded that hospital visitation restrictions are in effect and the importance of the restrictions. Informed pt that she may have 1 visitor wait in the waiting area while she is in pre-op, surgery and PACU. Pt voiced understanding.

## 2018-11-04 ENCOUNTER — Ambulatory Visit (HOSPITAL_COMMUNITY): Payer: Medicare HMO | Admitting: Anesthesiology

## 2018-11-04 ENCOUNTER — Ambulatory Visit (HOSPITAL_COMMUNITY)
Admission: RE | Admit: 2018-11-04 | Discharge: 2018-11-04 | Disposition: A | Discharge status: Home / Self Care | Payer: Medicare HMO | Attending: Vascular Surgery | Admitting: Vascular Surgery

## 2018-11-04 ENCOUNTER — Other Ambulatory Visit: Payer: Self-pay

## 2018-11-04 ENCOUNTER — Encounter (HOSPITAL_COMMUNITY): Payer: Self-pay

## 2018-11-04 ENCOUNTER — Encounter (HOSPITAL_COMMUNITY)
Admission: RE | Disposition: A | Discharge status: Home / Self Care | Payer: Self-pay | Source: Home / Self Care | Attending: Vascular Surgery

## 2018-11-04 DIAGNOSIS — E1122 Type 2 diabetes mellitus with diabetic chronic kidney disease: Secondary | ICD-10-CM | POA: Diagnosis not present

## 2018-11-04 DIAGNOSIS — Z87891 Personal history of nicotine dependence: Secondary | ICD-10-CM | POA: Diagnosis not present

## 2018-11-04 DIAGNOSIS — Z79899 Other long term (current) drug therapy: Secondary | ICD-10-CM | POA: Insufficient documentation

## 2018-11-04 DIAGNOSIS — Z452 Encounter for adjustment and management of vascular access device: Secondary | ICD-10-CM | POA: Insufficient documentation

## 2018-11-04 DIAGNOSIS — N186 End stage renal disease: Secondary | ICD-10-CM

## 2018-11-04 DIAGNOSIS — I120 Hypertensive chronic kidney disease with stage 5 chronic kidney disease or end stage renal disease: Secondary | ICD-10-CM | POA: Diagnosis not present

## 2018-11-04 DIAGNOSIS — Z992 Dependence on renal dialysis: Secondary | ICD-10-CM

## 2018-11-04 HISTORY — DX: Unspecified osteoarthritis, unspecified site: M19.90

## 2018-11-04 HISTORY — DX: Depression, unspecified: F32.A

## 2018-11-04 HISTORY — DX: Pneumonia, unspecified organism: J18.9

## 2018-11-04 HISTORY — DX: Anemia, unspecified: D64.9

## 2018-11-04 HISTORY — PX: BASCILIC VEIN TRANSPOSITION: SHX5742

## 2018-11-04 LAB — GLUCOSE, CAPILLARY
Glucose-Capillary: 107 mg/dL — ABNORMAL HIGH (ref 70–99)
Glucose-Capillary: 84 mg/dL (ref 70–99)
Glucose-Capillary: 88 mg/dL (ref 70–99)

## 2018-11-04 LAB — POCT I-STAT 4, (NA,K, GLUC, HGB,HCT)
Glucose, Bld: 102 mg/dL — ABNORMAL HIGH (ref 70–99)
HCT: 44 % (ref 36.0–46.0)
Hemoglobin: 15 g/dL (ref 12.0–15.0)
Potassium: 3.7 mmol/L (ref 3.5–5.1)
Sodium: 136 mmol/L (ref 135–145)

## 2018-11-04 LAB — SARS CORONAVIRUS 2 BY RT PCR (HOSPITAL ORDER, PERFORMED IN ~~LOC~~ HOSPITAL LAB): SARS Coronavirus 2: NEGATIVE

## 2018-11-04 SURGERY — TRANSPOSITION, VEIN, BASILIC
Anesthesia: Choice | Laterality: Left

## 2018-11-04 SURGERY — TRANSPOSITION, VEIN, BASILIC
Anesthesia: General | Site: Arm Upper | Laterality: Left

## 2018-11-04 MED ORDER — CEFAZOLIN SODIUM-DEXTROSE 2-4 GM/100ML-% IV SOLN
2.0000 g | INTRAVENOUS | Status: AC
  Administered 2018-11-04: 2 g via INTRAVENOUS
  Filled 2018-11-04: qty 100

## 2018-11-04 MED ORDER — FENTANYL CITRATE (PF) 100 MCG/2ML IJ SOLN
INTRAMUSCULAR | Status: AC
  Filled 2018-11-04: qty 2

## 2018-11-04 MED ORDER — SODIUM CHLORIDE 0.9 % IV SOLN
INTRAVENOUS | Status: DC | PRN
  Administered 2018-11-04: 500 mL

## 2018-11-04 MED ORDER — LIDOCAINE 2% (20 MG/ML) 5 ML SYRINGE
INTRAMUSCULAR | Status: DC | PRN
  Administered 2018-11-04: 80 mg via INTRAVENOUS

## 2018-11-04 MED ORDER — OXYCODONE-ACETAMINOPHEN 5-325 MG PO TABS: 1.0000 | ORAL_TABLET | Freq: Four times a day (QID) | ORAL | 0 refills | Status: DC | PRN

## 2018-11-04 MED ORDER — ONDANSETRON HCL 4 MG/2ML IJ SOLN: 4.0000 mg | Freq: Once | INTRAMUSCULAR | Status: DC | PRN

## 2018-11-04 MED ORDER — EPHEDRINE SULFATE 50 MG/ML IJ SOLN
INTRAMUSCULAR | Status: DC | PRN
  Administered 2018-11-04: 5 mg via INTRAVENOUS

## 2018-11-04 MED ORDER — OXYCODONE HCL 5 MG PO TABS
5.0000 mg | ORAL_TABLET | Freq: Once | ORAL | Status: AC | PRN
  Administered 2018-11-04: 5 mg via ORAL

## 2018-11-04 MED ORDER — FENTANYL CITRATE (PF) 250 MCG/5ML IJ SOLN
INTRAMUSCULAR | Status: DC | PRN
  Administered 2018-11-04: 50 ug via INTRAVENOUS
  Administered 2018-11-04 (×2): 25 ug via INTRAVENOUS

## 2018-11-04 MED ORDER — OXYCODONE HCL 5 MG/5ML PO SOLN: 5.0000 mg | Freq: Once | ORAL | Status: AC | PRN

## 2018-11-04 MED ORDER — ONDANSETRON HCL 4 MG/2ML IJ SOLN
INTRAMUSCULAR | Status: DC | PRN
  Administered 2018-11-04: 4 mg via INTRAVENOUS

## 2018-11-04 MED ORDER — SODIUM CHLORIDE 0.9 % IV SOLN
INTRAVENOUS | Status: DC
  Administered 2018-11-04: 13:00:00 via INTRAVENOUS

## 2018-11-04 MED ORDER — DEXAMETHASONE SODIUM PHOSPHATE 10 MG/ML IJ SOLN
INTRAMUSCULAR | Status: AC
  Filled 2018-11-04: qty 1

## 2018-11-04 MED ORDER — LIDOCAINE-EPINEPHRINE (PF) 1 %-1:200000 IJ SOLN
INTRAMUSCULAR | Status: AC
  Filled 2018-11-04: qty 30

## 2018-11-04 MED ORDER — FENTANYL CITRATE (PF) 100 MCG/2ML IJ SOLN
25.0000 ug | INTRAMUSCULAR | Status: DC | PRN
  Administered 2018-11-04 (×4): 25 ug via INTRAVENOUS

## 2018-11-04 MED ORDER — LABETALOL HCL 5 MG/ML IV SOLN: 10.0000 mg | INTRAVENOUS | Status: DC | PRN

## 2018-11-04 MED ORDER — DEXAMETHASONE SODIUM PHOSPHATE 10 MG/ML IJ SOLN
INTRAMUSCULAR | Status: DC | PRN
  Administered 2018-11-04: 10 mg via INTRAVENOUS

## 2018-11-04 MED ORDER — 0.9 % SODIUM CHLORIDE (POUR BTL) OPTIME
TOPICAL | Status: DC | PRN
  Administered 2018-11-04: 13:00:00 1000 mL

## 2018-11-04 MED ORDER — OXYCODONE HCL 5 MG PO TABS
ORAL_TABLET | ORAL | Status: AC
  Filled 2018-11-04: qty 1

## 2018-11-04 MED ORDER — SODIUM CHLORIDE 0.9 % IV SOLN
INTRAVENOUS | Status: AC
  Filled 2018-11-04: qty 1.2

## 2018-11-04 MED ORDER — PROPOFOL 10 MG/ML IV BOLUS
INTRAVENOUS | Status: DC | PRN
  Administered 2018-11-04: 120 mg via INTRAVENOUS

## 2018-11-04 SURGICAL SUPPLY — 30 items
ARMBAND PINK RESTRICT EXTREMIT (MISCELLANEOUS) ×3 IMPLANT
CANISTER SUCT 3000ML PPV (MISCELLANEOUS) ×3 IMPLANT
CLIP VESOCCLUDE MED 24/CT (CLIP) ×3 IMPLANT
CLIP VESOCCLUDE MED 6/CT (CLIP) ×3 IMPLANT
CLIP VESOCCLUDE SM WIDE 24/CT (CLIP) ×3 IMPLANT
CLIP VESOCCLUDE SM WIDE 6/CT (CLIP) ×3 IMPLANT
COVER PROBE W GEL 5X96 (DRAPES) ×3 IMPLANT
COVER WAND RF STERILE (DRAPES) ×3 IMPLANT
DERMABOND ADVANCED (GAUZE/BANDAGES/DRESSINGS) ×2
DERMABOND ADVANCED .7 DNX12 (GAUZE/BANDAGES/DRESSINGS) ×1 IMPLANT
ELECT REM PT RETURN 9FT ADLT (ELECTROSURGICAL) ×3
ELECTRODE REM PT RTRN 9FT ADLT (ELECTROSURGICAL) ×1 IMPLANT
GLOVE BIO SURGEON STRL SZ7.5 (GLOVE) ×3 IMPLANT
GOWN STRL REUS W/ TWL LRG LVL3 (GOWN DISPOSABLE) ×2 IMPLANT
GOWN STRL REUS W/ TWL XL LVL3 (GOWN DISPOSABLE) ×1 IMPLANT
GOWN STRL REUS W/TWL LRG LVL3 (GOWN DISPOSABLE) ×4
GOWN STRL REUS W/TWL XL LVL3 (GOWN DISPOSABLE) ×2
KIT BASIN OR (CUSTOM PROCEDURE TRAY) ×3 IMPLANT
KIT TURNOVER KIT B (KITS) ×3 IMPLANT
NS IRRIG 1000ML POUR BTL (IV SOLUTION) ×3 IMPLANT
PACK CV ACCESS (CUSTOM PROCEDURE TRAY) ×3 IMPLANT
PAD ARMBOARD 7.5X6 YLW CONV (MISCELLANEOUS) ×6 IMPLANT
SUT MNCRL AB 4-0 PS2 18 (SUTURE) ×6 IMPLANT
SUT PROLENE 6 0 BV (SUTURE) ×3 IMPLANT
SUT SILK 2 0 SH (SUTURE) ×3 IMPLANT
SUT VIC AB 3-0 SH 27 (SUTURE) ×4
SUT VIC AB 3-0 SH 27X BRD (SUTURE) ×2 IMPLANT
TOWEL GREEN STERILE (TOWEL DISPOSABLE) ×3 IMPLANT
UNDERPAD 30X30 (UNDERPADS AND DIAPERS) ×3 IMPLANT
WATER STERILE IRR 1000ML POUR (IV SOLUTION) ×3 IMPLANT

## 2018-11-04 NOTE — Transfer of Care (Signed)
Immediate Anesthesia Transfer of Care Note  Patient: Bianca Myers  Procedure(s) Performed: [place] (Left Arm Upper)  Patient Location: PACU  Anesthesia Type:General  Level of Consciousness: drowsy  Airway & Oxygen Therapy: Patient Spontanous Breathing and Patient connected to face mask oxygen  Post-op Assessment: Report given to RN and Post -op Vital signs reviewed and stable  Post vital signs: Reviewed and stable  Last Vitals:  Vitals Value Taken Time  BP 189/107 11/04/18 1400  Temp    Pulse 70 11/04/18 1400  Resp 22 11/04/18 1400  SpO2 100 % 11/04/18 1400  Vitals shown include unvalidated device data.  Last Pain:  Vitals:   11/04/18 0757  PainSc: 8          Complications: No apparent anesthesia complications

## 2018-11-04 NOTE — Op Note (Signed)
    Patient name: Bianca Myers MRN: [id number] DOB: 08/13/1945 Sex: female  11/04/2018 Pre-operative Diagnosis: esrd Post-operative diagnosis:  Same Surgeon:  Erlene Quan C. Donzetta Matters, MD Assistant: Laurence Slate, PA Procedure Performed: Left arm second stage basilic vein transposition fistula  Indications: 73 year old female with end-stage renal disease.  She is on dialysis via catheter.  She has left for stage basilic vein fistula that is now matured.  She is indicated for second stage fistula.  Findings: There was a large well matured fistula.  At completion there is a strong thrill and a palpable radial pulse the wrist both confirmed with Doppler.   Procedure:  The patient was identified in the holding area and taken to the operating where she placed upon operative general anesthesia induced.  She was sterilely prepped draped left upper extremity usual fashion antibiotics were administered timeout was called.  We opened the previous incision dissected down to the vein.  The nerve was densely adherent.  We made 2 separate incisions to the level of the axilla dissected the vein out away from the nerve divided branch between clips and ties and marked for orientation.  We clamped near the previous anastomosis and transected the vein.  We then tunneled it laterally flushed with heparinized saline.  We reclamped the vein.  We then spatulated both ends sewn end-to-end with 6-0 Prolene suture.  Prior to completion as muscular flushing by directions.  Upon completion there were strong thrill.  There is a palpable radial pulse the wrist.  Satisfied we obtained stasis irrigated the wounds and closed in layers of Vicryl Monocryl.  Dermabond placed to level skin.  She is awake and anesthesia having tolerated procedure without immediate complication.  EBL: 50 cc   Bianca Thall C. Donzetta Matters, MD [facility] [place] Office: [phone] Pager: [phone]

## 2018-11-04 NOTE — Anesthesia Preprocedure Evaluation (Addendum)
Anesthesia Evaluation  Patient identified by MRN, date of birth, ID band Patient awake    Reviewed: Allergy & Precautions, NPO status , Patient's Chart, lab work & pertinent test results, reviewed documented beta blocker date and time   History of Anesthesia Complications Negative for: history of anesthetic complications  Airway Mallampati: II  TM Distance: >3 FB Neck ROM: Full    Dental  (+) Dental Advisory Given, Teeth Intact   Pulmonary former smoker,    Pulmonary exam normal        Cardiovascular hypertension, Pt. on medications and Pt. on home beta blockers Normal cardiovascular exam+ Valvular Problems/Murmurs MR    '20 TTE - EF 60-65%. There is mild asymmetric left ventricular hypertrophy. Left ventricular diastolic Doppler parameters are consistent with impaired relaxation. Mild outflow tract obstruction with 30 mm gradient. Left atrial size was mild-moderately dilated. Right atrial size was mildly dilated. Moderate MR.     Neuro/Psych PSYCHIATRIC DISORDERS Depression negative neurological ROS     GI/Hepatic negative GI ROS, Neg liver ROS,   Endo/Other  diabetes, Well Controlled, Type 2  Renal/GU ESRF and DialysisRenal disease     Musculoskeletal  (+) Arthritis ,  Gout    Abdominal   Peds  Hematology  (+) REFUSES BLOOD PRODUCTS, JEHOVAH'S WITNESS  Anesthesia Other Findings   Reproductive/Obstetrics                            Anesthesia Physical Anesthesia Plan  ASA: III  Anesthesia Plan: General   Post-op Pain Management:    Induction: Intravenous  PONV Risk Score and Plan: 3 and Treatment may vary due to age or medical condition, Ondansetron and Dexamethasone  Airway Management Planned: LMA  Additional Equipment: None  Intra-op Plan:   Post-operative Plan: Extubation in OR  Informed Consent: I have reviewed the patients History and Physical, chart, labs and  discussed the procedure including the risks, benefits and alternatives for the proposed anesthesia with the patient or authorized representative who has indicated his/her understanding and acceptance.     Dental advisory given  Plan Discussed with: CRNA and Anesthesiologist  Anesthesia Plan Comments:        Anesthesia Quick Evaluation

## 2018-11-04 NOTE — Discharge Instructions (Signed)
° °  [facility] [place] ° °Discharge Instructions ° °AV Fistula or Graft Surgery for Dialysis Access ° °Please refer to the following instructions for your post-procedure care. Your surgeon or physician assistant will discuss any changes with you. ° °Activity ° °You may drive the day following your surgery, if you are comfortable and no longer taking prescription pain medication. Resume full activity as the soreness in your incision resolves. ° °Bathing/Showering ° °You may shower after you go home. Keep your incision dry for 48 hours. Do not soak in a bathtub, hot tub, or swim until the incision heals completely. You may not shower if you have a hemodialysis catheter. ° °Incision Care ° °Clean your incision with mild soap and water after 48 hours. Pat the area dry with a clean towel. You do not need a bandage unless otherwise instructed. Do not apply any ointments or creams to your incision. You may have skin glue on your incision. Do not peel it off. It will come off on its own in about one week. Your arm may swell a bit after surgery. To reduce swelling use pillows to elevate your arm so it is above your heart. Your doctor will tell you if you need to lightly wrap your arm with an ACE bandage. ° °Diet ° °Resume your normal diet. There are not special food restrictions following this procedure. In order to heal from your surgery, it is CRITICAL to get adequate nutrition. Your body requires vitamins, minerals, and protein. Vegetables are the best source of vitamins and minerals. Vegetables also provide the perfect balance of protein. Processed food has little nutritional value, so try to avoid this. ° °Medications ° °Resume taking all of your medications. If your incision is causing pain, you may take over-the counter pain relievers such as acetaminophen (Tylenol). If you were prescribed a stronger pain medication, please be aware these medications can cause nausea and constipation. Prevent  nausea by taking the medication with a snack or meal. Avoid constipation by drinking plenty of fluids and eating foods with high amount of fiber, such as fruits, vegetables, and grains. Do not take Tylenol if you are taking prescription pain medications. ° ° ° ° °Follow up °Your surgeon may want to see you in the office following your access surgery. If so, this will be arranged at the time of your surgery. ° °Please call us immediately for any of the following conditions: ° °Increased pain, redness, drainage (pus) from your incision site °Fever of 101 degrees or higher °Severe or worsening pain at your incision site °Hand pain or numbness. ° °Reduce your risk of vascular disease: ° °Stop smoking. If you would like help, call [facility] at 1-800-QUIT-NOW (1-800-784-8669) or [facility] at 336-586-4000 ° °Manage your cholesterol °Maintain a desired weight °Control your diabetes °Keep your blood pressure down ° °Dialysis ° °It will take several weeks to several months for your new dialysis access to be ready for use. Your surgeon will determine when it is OK to use it. Your nephrologist will continue to direct your dialysis. You can continue to use your Permcath until your new access is ready for use. ° °If you have any questions, please call the office at [phone]. ° °

## 2018-11-04 NOTE — Anesthesia Procedure Notes (Signed)
Procedure Name: LMA Insertion Date/Time: 11/04/2018 12:46 PM Performed by: Cleda Daub, CRNA Pre-anesthesia Checklist: Patient identified, Emergency Drugs available, Suction available and Patient being monitored Patient Re-evaluated:Patient Re-evaluated prior to induction Oxygen Delivery Method: Circle system utilized Preoxygenation: Pre-oxygenation with 100% oxygen Induction Type: IV induction LMA: LMA inserted LMA Size: 4.0 Number of attempts: 1 Placement Confirmation: positive ETCO2 Tube secured with: Tape Dental Injury: Teeth and Oropharynx as per pre-operative assessment

## 2018-11-04 NOTE — Anesthesia Postprocedure Evaluation (Signed)
Anesthesia Post Note  Patient: Bianca Myers  Procedure(s) Performed: [place] (Left Arm Upper)     Patient location during evaluation: PACU Anesthesia Type: General Level of consciousness: awake and alert Pain management: pain level controlled Vital Signs Assessment: post-procedure vital signs reviewed and stable Respiratory status: spontaneous breathing, nonlabored ventilation and respiratory function stable Cardiovascular status: blood pressure returned to baseline and stable Postop Assessment: no apparent nausea or vomiting Anesthetic complications: no    Last Vitals:  Vitals:   11/04/18 1445 11/04/18 1530  BP: (!) 194/96 (!) 184/91  Pulse: 69 (!) 59  Resp: 15 16  Temp:    SpO2: 97% 98%                  Audry Pili

## 2018-11-04 NOTE — H&P (Signed)
   History and Physical Update  The patient was interviewed and re-examined.  The patient's previous History and Physical has been reviewed and is unchanged from recent office visit. Plan for left arm 2nd stage bvt today in OR.   Indianna Boran C. Donzetta Matters, MD [facility] [place] Office: [phone] Pager: [phone]   11/04/2018, 9:43 AM

## 2018-11-05 ENCOUNTER — Encounter (HOSPITAL_COMMUNITY): Payer: Self-pay | Admitting: Vascular Surgery

## 2018-12-02 ENCOUNTER — Other Ambulatory Visit: Payer: Self-pay

## 2018-12-02 DIAGNOSIS — Z992 Dependence on renal dialysis: Secondary | ICD-10-CM

## 2018-12-02 DIAGNOSIS — N186 End stage renal disease: Secondary | ICD-10-CM

## 2018-12-08 ENCOUNTER — Telehealth (HOSPITAL_COMMUNITY): Payer: Self-pay | Admitting: *Deleted

## 2018-12-08 NOTE — Telephone Encounter (Signed)
Attempted to reach pt to confirm appt.

## 2018-12-09 ENCOUNTER — Ambulatory Visit (HOSPITAL_COMMUNITY)
Admission: RE | Admit: 2018-12-09 | Discharge: 2018-12-09 | Disposition: A | Discharge status: Home / Self Care | Payer: Medicare HMO | Source: Ambulatory Visit | Attending: Family | Admitting: Family

## 2018-12-09 ENCOUNTER — Other Ambulatory Visit: Payer: Self-pay | Admitting: Vascular Surgery

## 2018-12-09 ENCOUNTER — Other Ambulatory Visit: Payer: Self-pay

## 2018-12-09 ENCOUNTER — Ambulatory Visit (INDEPENDENT_AMBULATORY_CARE_PROVIDER_SITE_OTHER): Payer: Self-pay | Admitting: Physician Assistant

## 2018-12-09 ENCOUNTER — Encounter: Payer: Self-pay | Admitting: *Deleted

## 2018-12-09 VITALS — BP 122/77 | HR 65 | Resp 14 | Ht 67.0 in | Wt 172.0 lb

## 2018-12-09 DIAGNOSIS — Z992 Dependence on renal dialysis: Secondary | ICD-10-CM

## 2018-12-09 DIAGNOSIS — N186 End stage renal disease: Secondary | ICD-10-CM

## 2018-12-09 NOTE — Progress Notes (Signed)
  POST OPERATIVE OFFICE NOTE    CC:  F/u for surgery  HPI:  This is a 73 y.o. female who is s/p Second stage basilic transposition 0/11/6281.  Juanell Fairly NP sent the patient for evaluation of her left UE BC fistula.  She was concerned with the flow in the proximal 1/2.  It has a pulsatile feel instead of a thrill.  She is on HD MWF via right TDC.  She states the [place] malfunction at times as well.    She denise pain, numbness and loss of sensation.  No Known Allergies  Current Outpatient Medications  Medication Sig Dispense Refill  . acetaminophen (TYLENOL) 650 MG CR tablet Take 1,300 mg by mouth every 8 (eight) hours as needed for pain.    Marland Kitchen amLODipine (NORVASC) 5 MG tablet Take 5 mg by mouth at bedtime.    . Ascorbic Acid (VITAMIN C) 1000 MG tablet Take 1,000 mg by mouth 2 (two) times a day.     Marland Kitchen atorvastatin (LIPITOR) 10 MG tablet Take 10 mg by mouth at bedtime.    Marland Kitchen escitalopram (LEXAPRO) 5 MG tablet Take 5 mg by mouth daily.    . furosemide (LASIX) 40 MG tablet Take 1 tablet (40 mg total) by mouth every Tuesday, Thursday, Saturday, and Sunday. 30 tablet 0  . labetalol (NORMODYNE) 200 MG tablet Take 200 mg by mouth 2 (two) times daily.    . multivitamin (RENA-VIT) TABS tablet Take 1 tablet by mouth at bedtime. 30 tablet 0  . Omega-3 Fatty Acids (FISH OIL) 1000 MG CAPS Take 1,000 mg by mouth 2 (two) times daily.     . polyethylene glycol (MIRALAX) 17 g packet Take 17 g by mouth daily as needed for moderate constipation. 14 each 0  . Turmeric 500 MG CAPS Take 500 mg by mouth daily.     Marland Kitchen oxyCODONE-acetaminophen (PERCOCET/ROXICET) 5-325 MG tablet Take 1 tablet by mouth every 6 (six) hours as needed. (Patient not taking: Reported on 12/09/2018) 12 tablet 0   No current facility-administered medications for this visit.      ROS:  See HPI  Physical Exam:    Incision:  Well healed, minimal edema in the left UE compared to the right. Extremities:  Palpable distal 1/2 thrill with  pulsatile feel in proximal 1/2.  Doppler signal decreased in the mid section of the fistula.  Multiple superficial veins visible on the Left UE compared to the right Heart RRR Abdomen:  Soft NTTP, + BS Gen NAD  Assessment/Plan:  This is a 73 y.o. 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  She is ready to start using the fistula from a time stand point it was created on 08/24/2018.   Roxy Horseman PA-C [facility] [phone]  Clinic MD:  Oneida Alar

## 2018-12-10 ENCOUNTER — Other Ambulatory Visit (HOSPITAL_COMMUNITY): Payer: Medicare HMO

## 2018-12-10 ENCOUNTER — Encounter (HOSPITAL_COMMUNITY): Payer: Medicare HMO

## 2018-12-16 ENCOUNTER — Encounter (HOSPITAL_COMMUNITY)
Admission: RE | Disposition: A | Discharge status: Home / Self Care | Payer: Self-pay | Source: Home / Self Care | Attending: Vascular Surgery

## 2018-12-16 ENCOUNTER — Ambulatory Visit (HOSPITAL_COMMUNITY)
Admission: RE | Admit: 2018-12-16 | Discharge: 2018-12-16 | Disposition: A | Discharge status: Home / Self Care | Payer: Medicare HMO | Attending: Vascular Surgery | Admitting: Vascular Surgery

## 2018-12-16 ENCOUNTER — Other Ambulatory Visit: Payer: Self-pay

## 2018-12-16 DIAGNOSIS — Z20828 Contact with and (suspected) exposure to other viral communicable diseases: Secondary | ICD-10-CM | POA: Insufficient documentation

## 2018-12-16 DIAGNOSIS — Z992 Dependence on renal dialysis: Secondary | ICD-10-CM | POA: Insufficient documentation

## 2018-12-16 DIAGNOSIS — I871 Compression of vein: Secondary | ICD-10-CM | POA: Insufficient documentation

## 2018-12-16 DIAGNOSIS — Y832 Surgical operation with anastomosis, bypass or graft as the cause of abnormal reaction of the patient, or of later complication, without mention of misadventure at the time of the procedure: Secondary | ICD-10-CM | POA: Diagnosis not present

## 2018-12-16 DIAGNOSIS — T82898A Other specified complication of vascular prosthetic devices, implants and grafts, initial encounter: Secondary | ICD-10-CM

## 2018-12-16 DIAGNOSIS — Z79899 Other long term (current) drug therapy: Secondary | ICD-10-CM | POA: Diagnosis not present

## 2018-12-16 DIAGNOSIS — N185 Chronic kidney disease, stage 5: Secondary | ICD-10-CM

## 2018-12-16 DIAGNOSIS — T82510A Breakdown (mechanical) of surgically created arteriovenous fistula, initial encounter: Secondary | ICD-10-CM | POA: Diagnosis present

## 2018-12-16 HISTORY — PX: PERIPHERAL VASCULAR INTERVENTION: CATH118257

## 2018-12-16 HISTORY — PX: A/V FISTULAGRAM: CATH118298

## 2018-12-16 LAB — POCT I-STAT, CHEM 8
BUN: 30 mg/dL — ABNORMAL HIGH (ref 8–23)
Calcium, Ion: 1.17 mmol/L (ref 1.15–1.40)
Chloride: 100 mmol/L (ref 98–111)
Creatinine, Ser: 6 mg/dL — ABNORMAL HIGH (ref 0.44–1.00)
Glucose, Bld: 118 mg/dL — ABNORMAL HIGH (ref 70–99)
HCT: 39 % (ref 36.0–46.0)
Hemoglobin: 13.3 g/dL (ref 12.0–15.0)
Potassium: 3.9 mmol/L (ref 3.5–5.1)
Sodium: 138 mmol/L (ref 135–145)
TCO2: 26 mmol/L (ref 22–32)

## 2018-12-16 LAB — SARS CORONAVIRUS 2 (TAT 6-24 HRS): SARS Coronavirus 2: NEGATIVE

## 2018-12-16 SURGERY — A/V FISTULAGRAM
Anesthesia: LOCAL | Laterality: Left

## 2018-12-16 MED ORDER — HYDROCODONE-ACETAMINOPHEN 5-325 MG PO TABS: 1.0000 | ORAL_TABLET | ORAL | 0 refills | Status: DC | PRN

## 2018-12-16 MED ORDER — HEPARIN (PORCINE) IN NACL 1000-0.9 UT/500ML-% IV SOLN
INTRAVENOUS | Status: AC
  Filled 2018-12-16: qty 500

## 2018-12-16 MED ORDER — IODIXANOL 320 MG/ML IV SOLN
INTRAVENOUS | Status: DC | PRN
  Administered 2018-12-16: 50 mL

## 2018-12-16 MED ORDER — SODIUM CHLORIDE 0.9% FLUSH: 3.0000 mL | INTRAVENOUS | Status: DC | PRN

## 2018-12-16 MED ORDER — LIDOCAINE HCL (PF) 1 % IJ SOLN
INTRAMUSCULAR | Status: DC | PRN
  Administered 2018-12-16: 8 mL

## 2018-12-16 MED ORDER — HEPARIN SODIUM (PORCINE) 1000 UNIT/ML IJ SOLN
INTRAMUSCULAR | Status: DC | PRN
  Administered 2018-12-16: 3000 [IU] via INTRAVENOUS

## 2018-12-16 MED ORDER — HEPARIN (PORCINE) IN NACL 1000-0.9 UT/500ML-% IV SOLN
INTRAVENOUS | Status: DC | PRN
  Administered 2018-12-16: 500 mL

## 2018-12-16 MED ORDER — HEPARIN SODIUM (PORCINE) 1000 UNIT/ML IJ SOLN
INTRAMUSCULAR | Status: AC
  Filled 2018-12-16: qty 1

## 2018-12-16 MED ORDER — LIDOCAINE HCL (PF) 1 % IJ SOLN
INTRAMUSCULAR | Status: AC
  Filled 2018-12-16: qty 30

## 2018-12-16 SURGICAL SUPPLY — 24 items
BAG SNAP BAND KOVER 36X36 (MISCELLANEOUS) ×2 IMPLANT
BALLN LUTONIX AV 8X60X75 (BALLOONS) ×2
BALLN MUSTANG 6X60X75 (BALLOONS) ×2
BALLN MUSTANG 8X20X75 (BALLOONS) ×2
BALLN MUSTANG 9X80X75 (BALLOONS) ×2
BALLOON LUTONIX AV 8X60X75 (BALLOONS) ×1 IMPLANT
BALLOON MUSTANG 6X60X75 (BALLOONS) ×1 IMPLANT
BALLOON MUSTANG 8X20X75 (BALLOONS) ×1 IMPLANT
BALLOON MUSTANG 9X80X75 (BALLOONS) ×1 IMPLANT
COVER DOME SNAP 22 D (MISCELLANEOUS) ×2 IMPLANT
KIT ENCORE 26 ADVANTAGE (KITS) ×2 IMPLANT
KIT MICROPUNCTURE NIT STIFF (SHEATH) ×2 IMPLANT
PROTECTION STATION PRESSURIZED (MISCELLANEOUS) ×2
SHEATH PINNACLE R/O II 6F 4CM (SHEATH) ×2 IMPLANT
SHEATH PINNACLE R/O II 7F 4CM (SHEATH) ×2 IMPLANT
SHEATH PROBE COVER 6X72 (BAG) ×4 IMPLANT
STATION PROTECTION PRESSURIZED (MISCELLANEOUS) ×1 IMPLANT
STENT VIABAHN 8X100X120 (Permanent Stent) ×1 IMPLANT
STENT VIABAHN 8X10X120 (Permanent Stent) ×1 IMPLANT
STOPCOCK MORSE 400PSI 3WAY (MISCELLANEOUS) ×2 IMPLANT
TRAY PV CATH (CUSTOM PROCEDURE TRAY) ×2 IMPLANT
TUBING CIL FLEX 10 FLL-RA (TUBING) ×2 IMPLANT
WIRE BENTSON .035X145CM (WIRE) ×2 IMPLANT
WIRE SPARTACORE .014X190CM (WIRE) ×2 IMPLANT

## 2018-12-16 NOTE — Progress Notes (Addendum)
Discharge instructions reviewed with pt. Voices understanding.  Pt son Brenton Grills called and updated. Pt to be d/c at 1445

## 2018-12-16 NOTE — Op Note (Signed)
OPERATIVE NOTE   PROCEDURE: 1. left brachiobasilic arteriovenous fistula cannulation under ultrasound guidance 2. left arm fistulogram including central venogram 3. left basilic vein angioplasty (6 mm x 60 mm Mustang, 8 mm x 60 mm drug coated Lutonix, and 9 mm x 80 mm Mustang) 4. left basilic vein stent (8 mm x 10 cm Viabhahn)   PRE-OPERATIVE DIAGNOSIS: Malfunctioning left arteriovenous fistula  POST-OPERATIVE DIAGNOSIS: same as above   SURGEON: Marty Heck, MD  ANESTHESIA: local  ESTIMATED BLOOD LOSS: 5 cc  FINDING(S): 1. The left basilic vein had a 33% stenosis in the mid upper arm.  This was initially angioplastied with a 6 mm x 60 mm Mustang and then an 8 mm x 60 mm drug-coated Lutonix.  There was still near 50% stenosis in the basilic vein.  I elected to use a larger balloon with a 9 mm x 80 mm Mustang.  Unfortunately there was active extravasation at the stenotic segment of basilic vein after using the larger balloon.  I subsequently covered this with a 8 mm x 10 cm Viabahn with no further active extravasation.  She does have a hematoma in her upper arm.  She is motor and sensory intact.  Fistula has a good thrill.  SPECIMEN(S):  None  CONTRAST: 50 cc  INDICATIONS: Bianca Myers is a 73 y.o. female who  presents with malfunctioning left brachiobasilic arteriovenous fistula.  The patient is scheduled for left arm fistulogram.  The patient is aware the risks include but are not limited to: bleeding, infection, thrombosis of the cannulated access, and possible anaphylactic reaction to the contrast.  The patient is aware of the risks of the procedure and elects to proceed forward.  DESCRIPTION: After full informed written consent was obtained, the patient was brought back to the angiography suite and placed supine upon the angiography table.  The patient was connected to monitoring equipment.  The left arm was prepped and draped in the standard fashion for a left  arm fistulogram.  Under ultrasound guidance, the fistula was evaluated, it was patent, and image was saved.  It was cannulated with a micropuncture needle under ultrasound guidance.  The microwire was advanced into the fistula and the needle was exchanged for the a microsheath, which was lodged 2 cm into the access.  The wire was removed and the sheath was connected to the IV extension tubing.  Hand injections were completed to image the access from the antecubitum up to the level of axilla.  The central venous structures were also imaged by hand injections.   Ultimately elected to intervene on the basilic vein stenosis in the mid upper arm.  Placed a Bentson wire exchanged for short 6 French sheath.  Patient was given 3000 units of IV heparin.  Angioplastied the basilic vein stenosis with a 6 mm x 60 mm Mustang and then an 8 mm x 60 mm drug-coated Lutonix.  There was still greater than 50% stenosis here.  I elected to upsize to a 9 mm Mustang to be more aggressive given the residual stenosis.  I exchanged for a 7 Fr short sheath.  There was active extravasation after this and she developed arm hematoma.  I used the 9 mm balloon that was reinflated over the extravasation to control hemorrhage.  I then exchanged my wire for a spartacore.  This area in the basilic vein of the upper arm was covered with a 8 mm x 10 cm Viabahn.  That point in time we had  another angiogram with no evidence of active extravasation after stent placement.  The sheath was removed with a figure-of-eight pursestring.  Fistula had an excellent thrill.  She has a hematoma over her arm and this is nonexpanding at this time.  We will watch her in recovery for several hours.  COMPLICATIONS: None  CONDITION: Stable, will monitor her arm hematoma in PACU for next several hours  Marty Heck, MD [facility] [place] Office: [phone] Pager: [phone]  12/16/2018 12:55 PM

## 2018-12-16 NOTE — Discharge Instructions (Signed)
Dialysis Fistulogram ° °A dialysis fistulogram is an X-ray test to look inside the site where blood is removed and returned to your body during dialysis. °Fistulas and grafts provide easy and effective access for dialysis. However, sometimes problems can occur. The fistula or graft can become clogged or narrow. This can make dialysis less effective. You may need to have a fistulogram to check for these problems. If a problem is found, your health care provider can do treatments during the procedure to clear the blocked or narrowed areas. Treatments to open a blocked dialysis fistula or graft may include: °· Removing the clot from the fistula or graft with a device (thrombectomy). °· A procedure to open or widen the blocked area with a balloon (angioplasty). °· Placing a small mesh tube (stent) to keep the fistula or graft open. °· Injecting a medicine to dissolve the clot (thrombolysis). °Tell a health care provider about: °· Any allergies you have, including any reactions you have had to contrast dye. °· All medicines you are taking, including vitamins, herbs, eye drops, creams, and over-the-counter medicines. °· Any problems you or family members have had with anesthetic medicines. °· Any blood disorders you have. °· Any surgeries you have had. °· Any medical conditions you have. °· Whether you are pregnant or may be pregnant. °· Any pain, redness, or swelling you have in your limb that has the dialysis fistula or graft. °· Any bleeding from your dialysis fistula or graft. °· Any of the following in the part of your body where the dialysis fistula or graft leads: °? Numbness. °? Tingling. °? A cold feeling. °? Areas that are bluish or pale white in color. °What are the risks? °Generally, this is a safe procedure. However, problems may occur, including: °· Infection. °· Bleeding. °· Allergic reactions to medicines or dyes. °· Damage to other structures, such as nearby nerves or blood vessels. °· A blood clot in the  dialysis fistula or graft. °· A blood clot that travels to your lungs (pulmonary embolism). °What happens before the procedure? °General instructions °· Follow instructions from your health care provider about eating and drinking restrictions. °· Ask your health care provider about: °? Changing or stopping your regular medicines. This is especially important if you are taking diabetes medicines or blood thinners (anticoagulants). °? Taking medicines such as aspirin and ibuprofen. These medicines can thin your blood. Do not take these medicines unless your health care provider tells you to take them. °? Taking over-the-counter medicines, vitamins, herbs, and supplements. °· Plan to have someone take you home from the hospital or clinic. °· Plan to have a responsible adult care for you for at least 24 hours after you leave the hospital or clinic. This is important. °· You may have a blood or urine sample taken. These will help your health care provider learn how well your kidneys and liver are working and how well your blood clots. °What happens during the procedure? °· An IV will be inserted into one of your veins. °· You will be given one or more of the following: °? A medicine to help you relax (sedative). °? A medicine to numb the area (local anesthetic) on the limb with your fistula or graft. °· A needle will be inserted into your vein. °· A small, thin tube (catheter) will be inserted into the needle and guided to the area of possible problems. °· Dye (contrast material) will be injected into the catheter. As the contrast material passes through   your body, you may feel warm. °· X-rays will be taken. The contrast material will show where any narrowing or blockages are. °· If narrowing or a blockage is seen, the health care provider may do one of the following to open the blockage: °? Thrombectomy. The health care provider will use a mechanical device at the end of the catheter to break up the  clot. °? Angioplasty. The health care provider will advance a guide wire and balloon-tipped catheter to the blockage site. The balloon will be inflated for a short period of time. A stent may also be placed to keep the blocked area open. °? Thrombolysis. The health care provider will deliver a clot-dissolving medicine through the catheter. °· When the procedure is complete, the catheter will be removed. °· In some cases, the catheter site will be closed with stitches (sutures). °· Pressure will be applied to stop any bleeding, and your skin will be covered with a bandage (dressing). °The procedure may vary among health care providers and hospitals. °What happens after the procedure? °· Your blood pressure, heart rate, breathing rate, and blood oxygen level will be monitored until you leave the hospital or clinic. Your health care provider will also monitor your access site. °· You will need to stay in bed for several hours. °· Do not drive for 24 hours if you were given a sedative during your procedure. °Summary °· A dialysis fistulogram is an X-ray test to look inside the site where blood is removed and returned to your body during dialysis. °· If a problem is found, your health care provider can also do treatments during the procedure to clear the blocked or narrowed areas. °This information is not intended to replace advice given to you by your health care provider. Make sure you discuss any questions you have with your health care provider. °Document Released: 07/04/2013 Document Revised: 03/20/2017 Document Reviewed: 03/20/2017 °Elsevier Patient Education © 2020 [employer] ° °

## 2018-12-16 NOTE — H&P (Signed)
History and Physical Interval Note:  12/16/2018 11:09 AM  [name]  has presented today for surgery, with the diagnosis of complication of fistula.  The various methods of treatment have been discussed with the patient and family. After consideration of risks, benefits and other options for treatment, the patient has consented to  Procedure(s): A/V FISTULAGRAM (Left) as a surgical intervention.  The patient's history has been reviewed, patient examined, no change in status, stable for surgery.  I have reviewed the patient's chart and labs.  Questions were answered to the patient's satisfaction.    Left arm fistulogram.  Marty Heck  CC:  F/u for surgery  HPI:  This is a 73 y.o. female who is s/p Second stage basilic transposition 07/04/6501.  Juanell Fairly NP sent the patient for evaluation of her left UE BC fistula.  She was concerned with the flow in the proximal 1/2.  It has a pulsatile feel instead of a thrill.  She is on HD MWF via right TDC.  She states the [facility] malfunction at times as well.               She denise pain, numbness and loss of sensation.  No Known Allergies        Current Outpatient Medications  Medication Sig Dispense Refill  . acetaminophen (TYLENOL) 650 MG CR tablet Take 1,300 mg by mouth every 8 (eight) hours as needed for pain.    Marland Kitchen amLODipine (NORVASC) 5 MG tablet Take 5 mg by mouth at bedtime.    . Ascorbic Acid (VITAMIN C) 1000 MG tablet Take 1,000 mg by mouth 2 (two) times a day.     Marland Kitchen atorvastatin (LIPITOR) 10 MG tablet Take 10 mg by mouth at bedtime.    Marland Kitchen escitalopram (LEXAPRO) 5 MG tablet Take 5 mg by mouth daily.    . furosemide (LASIX) 40 MG tablet Take 1 tablet (40 mg total) by mouth every Tuesday, Thursday, Saturday, and Sunday. 30 tablet 0  . labetalol (NORMODYNE) 200 MG tablet Take 200 mg by mouth 2 (two) times daily.    . multivitamin (RENA-VIT) TABS tablet Take 1 tablet by mouth at bedtime. 30 tablet 0  . Omega-3 Fatty  Acids (FISH OIL) 1000 MG CAPS Take 1,000 mg by mouth 2 (two) times daily.     . polyethylene glycol (MIRALAX) 17 g packet Take 17 g by mouth daily as needed for moderate constipation. 14 each 0  . Turmeric 500 MG CAPS Take 500 mg by mouth daily.     Marland Kitchen oxyCODONE-acetaminophen (PERCOCET/ROXICET) 5-325 MG tablet Take 1 tablet by mouth every 6 (six) hours as needed. (Patient not taking: Reported on 12/09/2018) 12 tablet 0   No current facility-administered medications for this visit.      ROS:  See HPI  Physical Exam:    Incision:  Well healed, minimal edema in the left UE compared to the right. Extremities:  Palpable distal 1/2 thrill with pulsatile feel in proximal 1/2.  Doppler signal decreased in the mid section of the fistula.  Multiple superficial veins visible on the Left UE compared to the right Heart RRR Abdomen:  Soft NTTP, + BS Gen NAD  Assessment/Plan:  This is a 73 y.o. 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  She is ready to start using the fistula from a time stand point it was created  on 08/24/2018.   Roxy Horseman PA-C [facility] [phone]  Clinic MD:  [facility]

## 2018-12-17 ENCOUNTER — Encounter (HOSPITAL_COMMUNITY): Payer: Self-pay | Admitting: Vascular Surgery

## 2018-12-21 ENCOUNTER — Other Ambulatory Visit: Payer: Self-pay | Admitting: Vascular Surgery

## 2018-12-21 MED ORDER — HYDROCODONE-ACETAMINOPHEN 5-325 MG PO TABS: 1.0000 | ORAL_TABLET | ORAL | 0 refills | Status: DC | PRN

## 2018-12-23 ENCOUNTER — Other Ambulatory Visit: Payer: Self-pay

## 2018-12-23 ENCOUNTER — Ambulatory Visit (INDEPENDENT_AMBULATORY_CARE_PROVIDER_SITE_OTHER): Payer: Self-pay | Admitting: Physician Assistant

## 2018-12-23 ENCOUNTER — Encounter: Payer: Self-pay | Admitting: Physician Assistant

## 2018-12-23 VITALS — BP 178/86 | HR 74 | Temp 97.9°F | Resp 14 | Ht 67.0 in | Wt 174.0 lb

## 2018-12-23 DIAGNOSIS — Z992 Dependence on renal dialysis: Secondary | ICD-10-CM

## 2018-12-23 DIAGNOSIS — N186 End stage renal disease: Secondary | ICD-10-CM

## 2018-12-23 NOTE — Progress Notes (Signed)
POST OPERATIVE OFFICE NOTE    CC:  F/u for surgery  HPI:  This is a 73 y.o. female who is s/p fistulogram with left basilic vein stent 46/96/2952 active extravasation at the stenotic segment of basilic vein after using the larger balloon.  I subsequently covered this with a 8 mm x 10 cm Viabahn with no further active extravasation.  She does have a hematoma in her upper arm.  She is motor and sensory intact.  Fistula has a good thrill.  Her second stage basilic 10/05/1322 and original creation 08/24/2018.  She is still having daily pain and wants to have the left arm examined to make sure she doesn't have any new issues from surgery.  She denise fever and chills.  She is on HD MWF via right TDC.    No Known Allergies  Current Outpatient Medications  Medication Sig Dispense Refill  . acetaminophen (TYLENOL) 650 MG CR tablet Take 1,300 mg by mouth every 8 (eight) hours as needed for pain.    Marland Kitchen amLODipine (NORVASC) 5 MG tablet Take 5 mg by mouth at bedtime.    . Ascorbic Acid (VITAMIN C) 1000 MG tablet Take 1,000 mg by mouth 2 (two) times a day.     Marland Kitchen atorvastatin (LIPITOR) 10 MG tablet Take 10 mg by mouth at bedtime.    Marland Kitchen escitalopram (LEXAPRO) 5 MG tablet Take 5 mg by mouth daily.    . ferric citrate (AURYXIA) 1 GM 210 MG(Fe) tablet Take 420 mg by mouth 3 (three) times daily with meals.    . furosemide (LASIX) 40 MG tablet Take 1 tablet (40 mg total) by mouth every Tuesday, Thursday, Saturday, and Sunday. (Patient taking differently: Take 80 mg by mouth every Tuesday, Thursday, Saturday, and Sunday. ) 30 tablet 0  . HYDROcodone-acetaminophen (NORCO/VICODIN) 5-325 MG tablet Take 1 tablet by mouth every 4 (four) hours as needed for moderate pain. 20 tablet 0  . labetalol (NORMODYNE) 200 MG tablet Take 200 mg by mouth 2 (two) times daily.    . multivitamin (RENA-VIT) TABS tablet Take 1 tablet by mouth at bedtime. 30 tablet 0  . Omega-3 Fatty Acids (FISH OIL) 1000 MG CAPS Take 1,000 mg by mouth 2  (two) times daily.     . polyethylene glycol (MIRALAX) 17 g packet Take 17 g by mouth daily as needed for moderate constipation. 14 each 0  . Turmeric 500 MG CAPS Take 500 mg by mouth daily.      No current facility-administered medications for this visit.      ROS:  See HPI  Physical Exam:    Incision:  Well healed stick site, volar surface hematoma,mild firmness and tender to palpation. Extremities:  Left UE radial pulse palpable, palpable thrill in fistula, arm and elbow motion are intact, but increase pain in the arm.  Left hand grip 5/5.   Assessment/Plan:  This is a 73 y.o. female who is s/p:left UE fistulogram with extravasation at angioplasty site requiring  subsequently covered this with a 8 mm x 10 cm Viabahn with no further active extravasation.   She was given additional pain medication.  I would like for her to wait until Nov. 16 2020 before the fistula is used.  I don't think she would tolerate using it until she has less pain.  Once it is healed and less painful the fistula can be used and the [facility] can be removed.  F/U PRN at this point.   Roxy Horseman PA-C [facility] [phone]  Clinic MD:  [facility]

## 2019-01-13 ENCOUNTER — Ambulatory Visit: Payer: Medicare HMO | Admitting: Family

## 2019-01-13 ENCOUNTER — Encounter: Payer: Self-pay | Admitting: Family

## 2019-01-13 ENCOUNTER — Other Ambulatory Visit: Payer: Self-pay

## 2019-01-13 VITALS — BP 138/72 | HR 70 | Temp 98.0°F | Resp 14 | Ht 67.0 in | Wt 178.7 lb

## 2019-01-13 DIAGNOSIS — Z189 Retained foreign body fragments, unspecified material: Secondary | ICD-10-CM

## 2019-01-13 DIAGNOSIS — Z992 Dependence on renal dialysis: Secondary | ICD-10-CM

## 2019-01-13 DIAGNOSIS — I770 Arteriovenous fistula, acquired: Secondary | ICD-10-CM

## 2019-01-13 DIAGNOSIS — T8189XA Other complications of procedures, not elsewhere classified, initial encounter: Secondary | ICD-10-CM

## 2019-01-13 DIAGNOSIS — N186 End stage renal disease: Secondary | ICD-10-CM

## 2019-01-13 NOTE — Progress Notes (Signed)
CC: retained suture s/p fistulagram, established Dialysis Access  History of Present Illness  Bianca Myers is a 73 y.o. (August 16, 1945) female who is s/p:  1. left brachiobasilic arteriovenous fistula cannulation under ultrasound guidance 2. left arm fistulogram including central venogram 3. left basilic vein angioplasty (6 mm x 60 mm Mustang, 8 mm x 60 mm drug coated Lutonix, and 9 mm x 80 mm Mustang) 4. left basilic vein stent (8 mm x 10 cm Viabhahn) 5. On 12-16-18 by Dr. Carlis Abbott for malfunctioning left arteriovenous fistula.   She returns today at the request of her dialysis center re suture remaining in left upper arm from fistula gram.  She denies fever or chills, denies steal type symptoms in her left upper extremity. She does report some tenderness at the medial aspect of her left upper arm, and that this is improving over time; there is a fading ecchymotic area where she has tenderness, no mass palpated.   She dialyzes MWF via right IJ TDC at [facility] on  [address] in [place]    Past Medical History:  Diagnosis Date  . Anemia   . Arthritis   . Chronic kidney disease (CKD) stage G5/A1, glomerular filtration rate (GFR) less than or equal to 15 mL/min/1.73 square meter and albuminuria creatinine ratio less than 30 mg/g (HCC)   . Depression   . Diabetes mellitus ([place])   . Gout   . History of anemia due to chronic kidney disease   . History of degenerative disc disease   . Hypertension   . Pneumonia   . Secondary hyperparathyroidism ([place])   . Spondylisthesis     Social History Social History   Tobacco Use  . Smoking status: Former Smoker    Quit date: 1973    Years since quitting: 47.8  . Smokeless tobacco: Never Used  Substance Use Topics  . Alcohol use: Not Currently  . Drug use: Never    Family History Family History  Family history unknown: Yes    Surgical History Past Surgical History:  Procedure Laterality Date  . A/V FISTULAGRAM  Left 12/16/2018   Procedure: A/V FISTULAGRAM;  Surgeon: Marty Heck, MD;  Location: [facility];  Service: Cardiovascular;  Laterality: Left;  . AV FISTULA PLACEMENT Left 08/24/2018   Procedure: ARTERIOVENOUS (AV) FISTULA CREATION;  Surgeon: Waynetta Sandy, MD;  Location: [facility];  Service: Vascular;  Laterality: Left;  . BASCILIC VEIN TRANSPOSITION Left 11/04/2018   Procedure: BASCILIC VEIN TRANSPOSITION SECOND STAGE;  Surgeon: Waynetta Sandy, MD;  Location: [facility];  Service: Vascular;  Laterality: Left;  . COLONOSCOPY    . HIP SURGERY    . IR FLUORO GUIDE CV LINE RIGHT  08/21/2018  . IR US GUIDE VASC ACCESS RIGHT  08/21/2018  . PERIPHERAL VASCULAR INTERVENTION Left 12/16/2018   Procedure: PERIPHERAL VASCULAR INTERVENTION;  Surgeon: Marty Heck, MD;  Location: [facility];  Service: Cardiovascular;  Laterality: Left;  arm fistula    No Known Allergies  Current Outpatient Medications  Medication Sig Dispense Refill  . acetaminophen (TYLENOL) 650 MG CR tablet Take 1,300 mg by mouth every 8 (eight) hours as needed for pain.    Marland Kitchen amLODipine (NORVASC) 5 MG tablet Take 5 mg by mouth at bedtime.    . Ascorbic Acid (VITAMIN C) 1000 MG tablet Take 1,000 mg by mouth 2 (two) times a day.     Marland Kitchen atorvastatin (LIPITOR) 10 MG tablet Take 10 mg by mouth at  bedtime.    Marland Kitchen escitalopram (LEXAPRO) 5 MG tablet Take 5 mg by mouth daily.    . ferric citrate (AURYXIA) 1 GM 210 MG(Fe) tablet Take 420 mg by mouth 3 (three) times daily with meals.    . furosemide (LASIX) 40 MG tablet Take 1 tablet (40 mg total) by mouth every Tuesday, Thursday, Saturday, and Sunday. (Patient taking differently: Take 80 mg by mouth every Tuesday, Thursday, Saturday, and Sunday. ) 30 tablet 0  . HYDROcodone-acetaminophen (NORCO/VICODIN) 5-325 MG tablet Take 1 tablet by mouth every 4 (four) hours as needed for moderate pain. 20 tablet 0  . labetalol (NORMODYNE) 200 MG tablet Take 200 mg by  mouth 2 (two) times daily.    . multivitamin (RENA-VIT) TABS tablet Take 1 tablet by mouth at bedtime. 30 tablet 0  . Omega-3 Fatty Acids (FISH OIL) 1000 MG CAPS Take 1,000 mg by mouth 2 (two) times daily.     . polyethylene glycol (MIRALAX) 17 g packet Take 17 g by mouth daily as needed for moderate constipation. 14 each 0  . Turmeric 500 MG CAPS Take 500 mg by mouth daily.      No current facility-administered medications for this visit.      REVIEW OF SYSTEMS: see HPI for pertinent positives and negatives    PHYSICAL EXAMINATION:  Vitals:   01/13/19 1312  BP: 138/72  Pulse: 70  Resp: 14  Temp: 98 F (36.7 C)  TempSrc: Temporal  SpO2: 97%  Weight: 178 lb 11.2 oz (81.1 kg)  Height: 5\' 7"  (1.702 m)   Body mass index is 27.99 kg/m.  General: Energetic elderly female in NAD  HEENT:  No gross abnormalities Pulmonary: Respirations are non-labored Musculoskeletal: There are no major deformities.   Neurologic: No focal weakness or paresthesias are detected Skin: There are no ulcer or rashes noted. Fading ecchymotic area at left upper arm, medial aspect Psychiatric: The patient has normal affect. Cardiovascular: There is a regular rhythm. Left radial pulse is 2+ palpable  Left upper arm AVF with good thrill and bruit Right IJ TDC in place   Medical Decision Making  Bianca Myers is a 73 y.o. female who presents with an external shallow suture that fell off when palpated.  Fistula gram insertion site is well healed. No steal sx's in left upper extremity. Left radial pulse is 2+ palpable. Good thrill and bruit left upper arm AVF. Pt reports tenderness at left upper medial arm, at site of fading ecchymosis. She states the tenderness is improving, no signs or sx's of infection. She requests that the AVF not be accessed for another week or 2, to allow the left upper arm tenderness to improve further; this is very reasonable since she has a right IJ TDC from which she is  currently dialyzed.  Follow up as needed.   Clemon Chambers, RN, MSN, FNP-C [facility] Office: [phone]  01/13/2019, 1:17 PM  Clinic MD: [facility]

## 2019-02-28 ENCOUNTER — Emergency Department (HOSPITAL_COMMUNITY): Payer: Medicare HMO

## 2019-02-28 ENCOUNTER — Emergency Department (HOSPITAL_COMMUNITY)
Admission: EM | Admit: 2019-02-28 | Discharge: 2019-02-28 | Disposition: A | Discharge status: Home / Self Care | Payer: Medicare HMO | Attending: Emergency Medicine | Admitting: Emergency Medicine

## 2019-02-28 ENCOUNTER — Encounter (HOSPITAL_COMMUNITY): Payer: Self-pay | Admitting: Emergency Medicine

## 2019-02-28 ENCOUNTER — Other Ambulatory Visit: Payer: Self-pay

## 2019-02-28 DIAGNOSIS — I129 Hypertensive chronic kidney disease with stage 1 through stage 4 chronic kidney disease, or unspecified chronic kidney disease: Secondary | ICD-10-CM | POA: Diagnosis not present

## 2019-02-28 DIAGNOSIS — Z87891 Personal history of nicotine dependence: Secondary | ICD-10-CM | POA: Diagnosis not present

## 2019-02-28 DIAGNOSIS — E1122 Type 2 diabetes mellitus with diabetic chronic kidney disease: Secondary | ICD-10-CM | POA: Insufficient documentation

## 2019-02-28 DIAGNOSIS — N184 Chronic kidney disease, stage 4 (severe): Secondary | ICD-10-CM | POA: Insufficient documentation

## 2019-02-28 DIAGNOSIS — Z79899 Other long term (current) drug therapy: Secondary | ICD-10-CM | POA: Diagnosis not present

## 2019-02-28 DIAGNOSIS — R5383 Other fatigue: Secondary | ICD-10-CM

## 2019-02-28 DIAGNOSIS — Z20828 Contact with and (suspected) exposure to other viral communicable diseases: Secondary | ICD-10-CM | POA: Diagnosis not present

## 2019-02-28 LAB — HEPATIC FUNCTION PANEL
ALT: 21 U/L (ref 0–44)
AST: 25 U/L (ref 15–41)
Albumin: 2.9 g/dL — ABNORMAL LOW (ref 3.5–5.0)
Alkaline Phosphatase: 55 U/L (ref 38–126)
Bilirubin, Direct: 0.1 mg/dL (ref 0.0–0.2)
Indirect Bilirubin: 0.7 mg/dL (ref 0.3–0.9)
Total Bilirubin: 0.8 mg/dL (ref 0.3–1.2)
Total Protein: 6.8 g/dL (ref 6.5–8.1)

## 2019-02-28 LAB — BASIC METABOLIC PANEL
Anion gap: 20 — ABNORMAL HIGH (ref 5–15)
Anion gap: 20 — ABNORMAL HIGH (ref 5–15)
BUN: 63 mg/dL — ABNORMAL HIGH (ref 8–23)
BUN: 73 mg/dL — ABNORMAL HIGH (ref 8–23)
CO2: 23 mmol/L (ref 22–32)
CO2: 22 mmol/L (ref 22–32)
Calcium: 9.4 mg/dL (ref 8.9–10.3)
Calcium: 9 mg/dL (ref 8.9–10.3)
Chloride: 90 mmol/L — ABNORMAL LOW (ref 98–111)
Chloride: 92 mmol/L — ABNORMAL LOW (ref 98–111)
Creatinine, Ser: 11.21 mg/dL — ABNORMAL HIGH (ref 0.44–1.00)
Creatinine, Ser: 11.94 mg/dL — ABNORMAL HIGH (ref 0.44–1.00)
GFR calc Af Amer: 3 mL/min — ABNORMAL LOW (ref >60)
GFR calc Af Amer: 3 mL/min — ABNORMAL LOW (ref >60)
GFR calc non Af Amer: 3 mL/min — ABNORMAL LOW (ref >60)
GFR calc non Af Amer: 3 mL/min — ABNORMAL LOW (ref >60)
Glucose, Bld: 210 mg/dL — ABNORMAL HIGH (ref 70–99)
Glucose, Bld: 128 mg/dL — ABNORMAL HIGH (ref 70–99)
Potassium: 5.3 mmol/L — ABNORMAL HIGH (ref 3.5–5.1)
Potassium: 4.8 mmol/L (ref 3.5–5.1)
Sodium: 133 mmol/L — ABNORMAL LOW (ref 135–145)
Sodium: 134 mmol/L — ABNORMAL LOW (ref 135–145)

## 2019-02-28 LAB — CBC
HCT: 36.3 % (ref 36.0–46.0)
Hemoglobin: 11.7 g/dL — ABNORMAL LOW (ref 12.0–15.0)
MCH: 30.1 pg (ref 26.0–34.0)
MCHC: 32.2 g/dL (ref 30.0–36.0)
MCV: 93.3 fL (ref 80.0–100.0)
Platelets: 138 10*3/uL — ABNORMAL LOW (ref 150–400)
RBC: 3.89 MIL/uL (ref 3.87–5.11)
RDW: 15.7 % — ABNORMAL HIGH (ref 11.5–15.5)
WBC: 11.8 10*3/uL — ABNORMAL HIGH (ref 4.0–10.5)
nRBC: 0 % (ref 0.0–0.2)

## 2019-02-28 LAB — POC SARS CORONAVIRUS 2 AG -  ED: SARS Coronavirus 2 Ag: NEGATIVE

## 2019-02-28 LAB — TROPONIN I (HIGH SENSITIVITY)
Troponin I (High Sensitivity): 86 ng/L — ABNORMAL HIGH (ref <18)
Troponin I (High Sensitivity): 79 ng/L — ABNORMAL HIGH (ref <18)

## 2019-02-28 LAB — CBG MONITORING, ED: Glucose-Capillary: 188 mg/dL — ABNORMAL HIGH (ref 70–99)

## 2019-02-28 LAB — AMMONIA: Ammonia: 12 umol/L (ref 9–35)

## 2019-02-28 LAB — TSH: TSH: 1.113 u[IU]/mL (ref 0.350–4.500)

## 2019-02-28 IMAGING — MR MR HEAD W/O CM
8 of 10 series · 35 of 48 positions shown · non-contrast
Comparison: Head CT [DATE]

CLINICAL DATA: Neuro deficit, acute, stroke suspected. Altered
mental status (AMS), unclear cause. Additional history provided:
Several days of confusion/altered mental status as well as right arm
weakness/coordination difficulties.

EXAM:
MRI HEAD WITHOUT CONTRAST
TECHNIQUE: Multiplanar, multiecho pulse sequences of the brain and surrounding
structures were obtained without intravenous contrast.

[Series 3: DWI · axial · 3.0mm · 0.94mm/px · z∈[-14,+132]mm · 9 of 99 slices shown (1 of 4)]
[im 1/99]
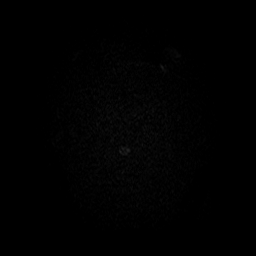
[im 13/99]
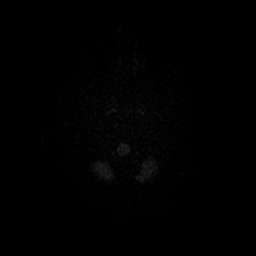
[im 25/99]
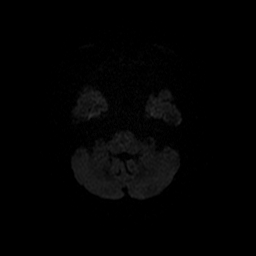
[im 37/99]
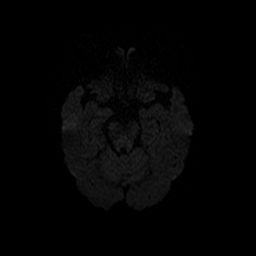
[im 50/99]
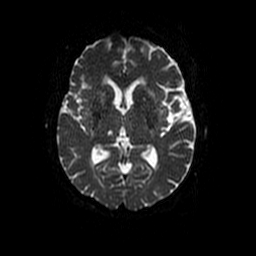
[im 62/99]
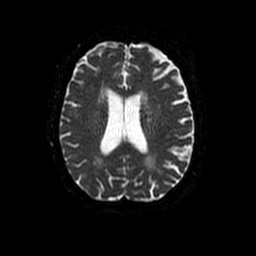
[im 74/99]
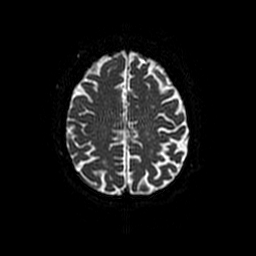
[im 86/99]
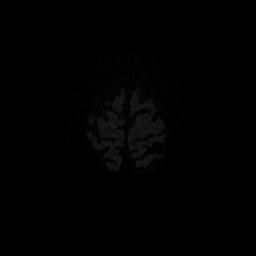
[im 99/99]
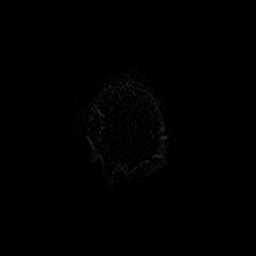

[Series 4: DWI · coronal · 5.0mm · 0.94mm/px · 6 of 74 slices shown (2 of 4)]
[im 1/74]
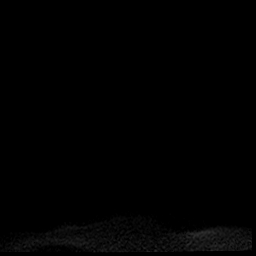
[im 15/74]
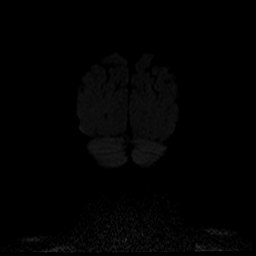
[im 30/74]
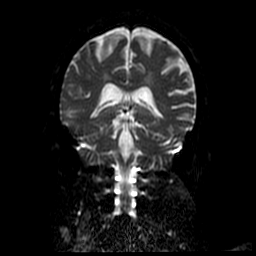
[im 44/74]
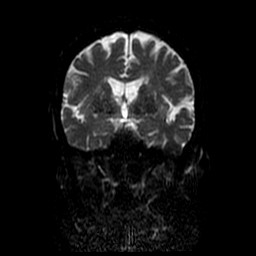
[im 59/74]
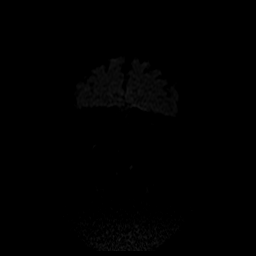
[im 74/74]
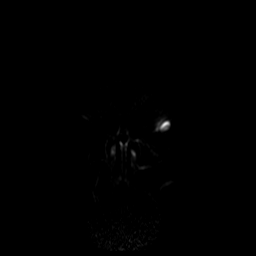

[Series 5: FLAIR · axial · 3.0mm · 0.47mm/px · z∈[-18,+131]mm · 3 of 26 slices shown]
[im 1/26]
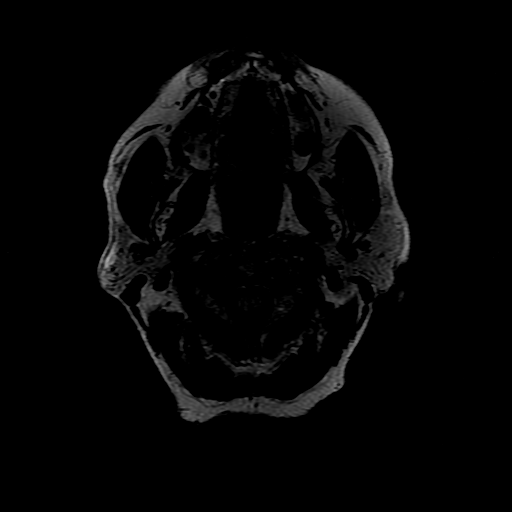
[im 13/26]
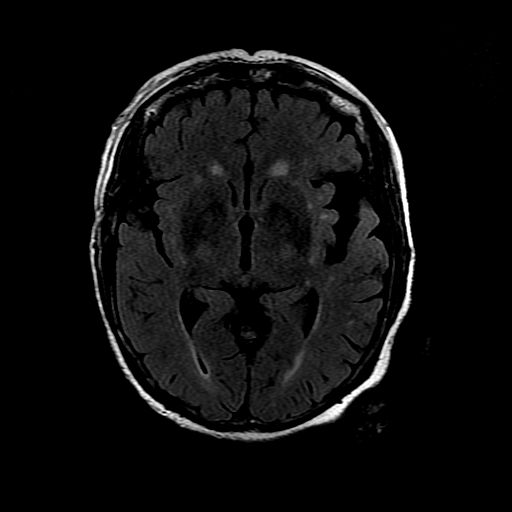
[im 26/26]
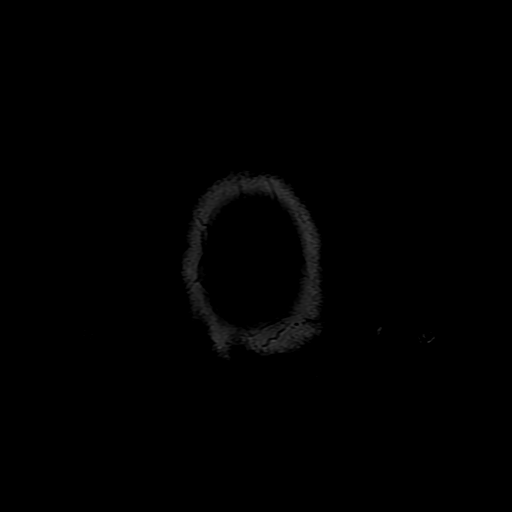

[Series 7: T1 · sagittal · 5.0mm · 0.47mm/px · 2 of 26 slices shown]
[im 1/26]
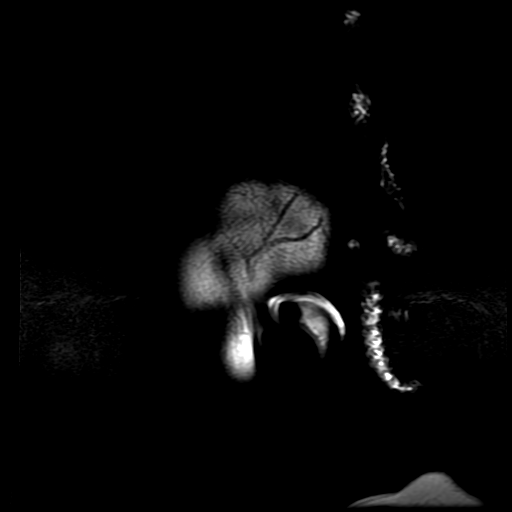
[im 13/26]
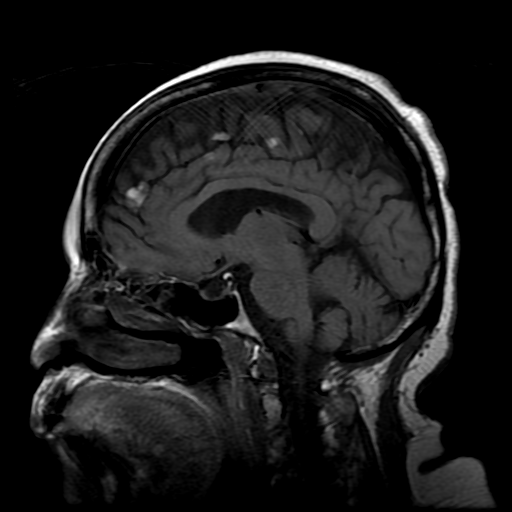

[Series 8: T2 · axial · 5.0mm · 0.47mm/px · z∈[-18,+131]mm · 3 of 26 slices shown (1 of 2)]
[im 1/26]
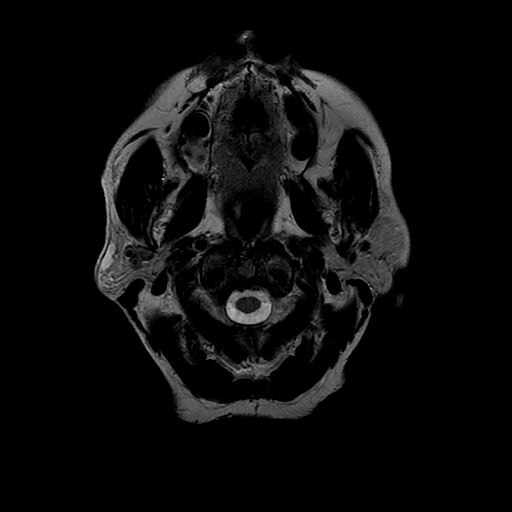
[im 13/26]
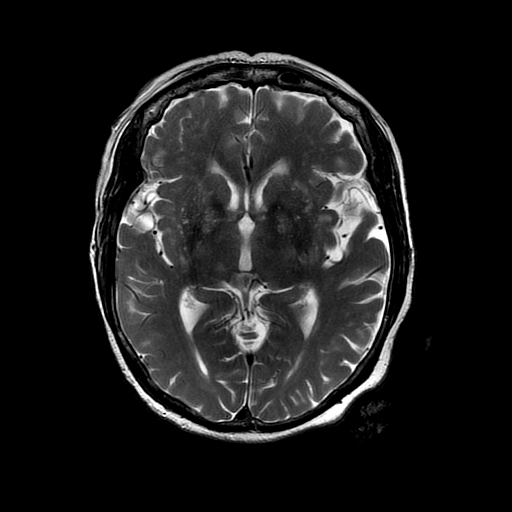
[im 26/26]
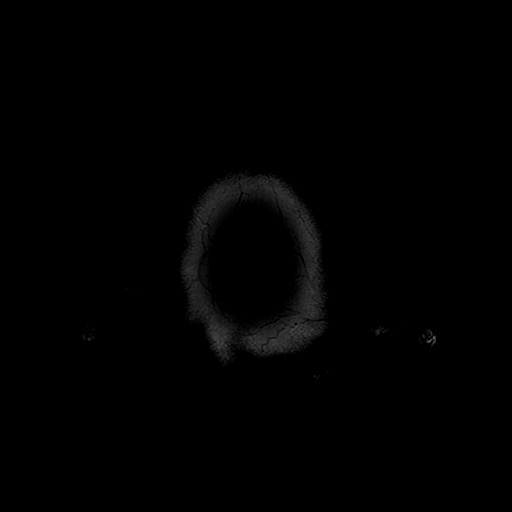

[Series 10: T2 · coronal · 5.0mm · 0.43mm/px · 3 of 31 slices shown (2 of 2)]
[im 1/31]
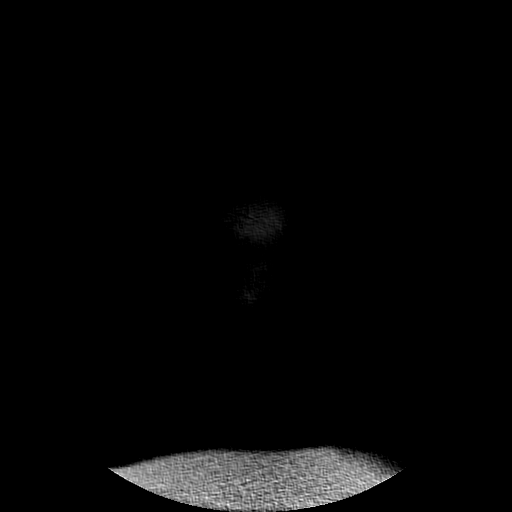
[im 16/31]
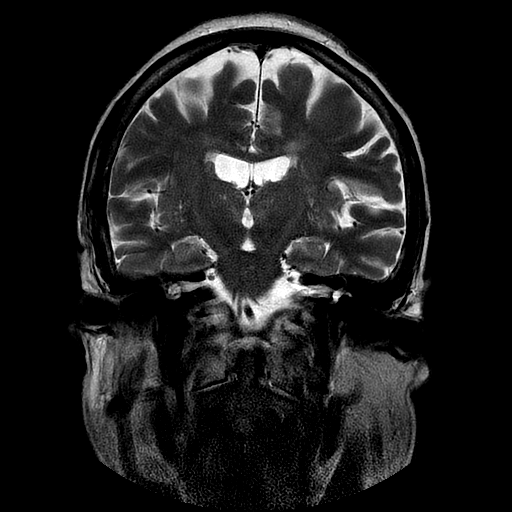
[im 31/31]
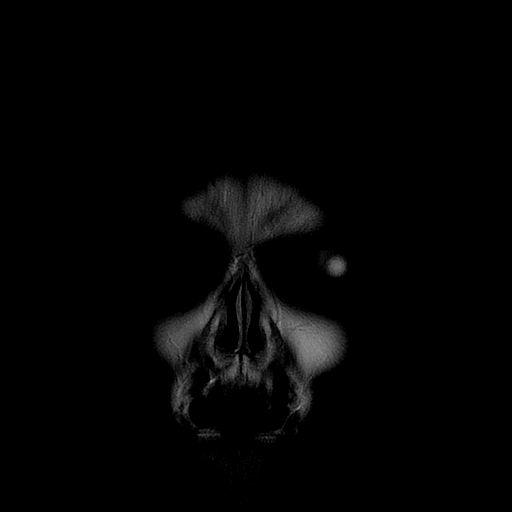

[Series 300: DWI · axial · 3.0mm · 0.94mm/px · z∈[-14,+132]mm · 5 of 50 slices shown (3 of 4)]
[im 1/50]
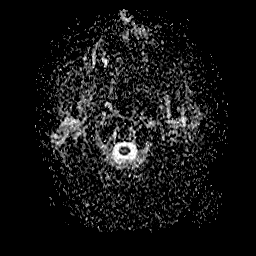
[im 13/50]
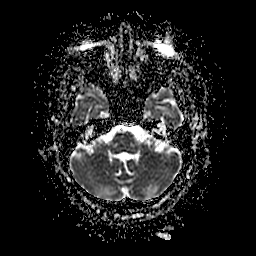
[im 25/50]
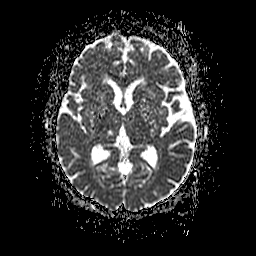
[im 37/50]
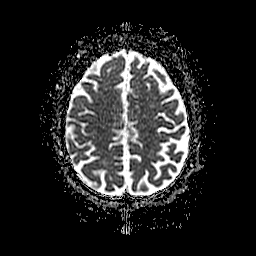
[im 50/50]
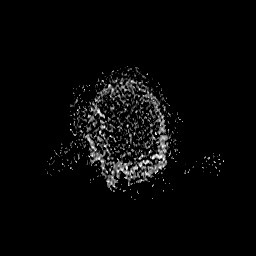

[Series 400: DWI · coronal · 5.0mm · 0.94mm/px · 4 of 37 slices shown (4 of 4)]
[im 1/37]
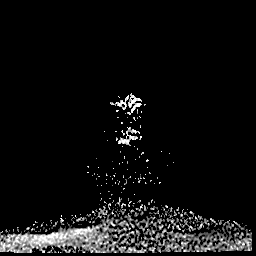
[im 13/37]
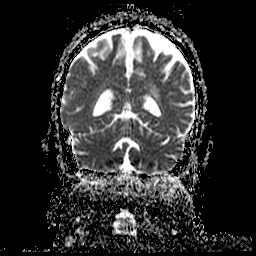
[im 25/37]
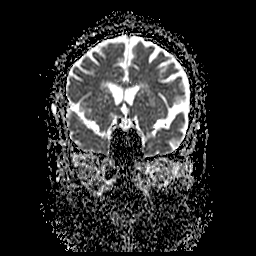
[im 37/37]
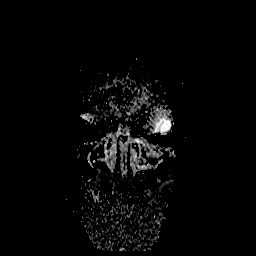

[35 of 48 positions shown; findings below may reference images not displayed]

FINDINGS: Brain:

There is no convincing evidence of acute infarct.

No evidence of intracranial mass.

No midline shift or extra-axial fluid collection.

No chronic intracranial blood products.

Moderate patchy T2/FLAIR hyperintensity within the cerebral white
matter is nonspecific, but consistent with chronic small vessel
ischemic disease. Mild chronic small vessel ischemic changes also
present within the brainstem. There are multiple chronic lacunar
infarcts within the bilateral basal ganglia and thalami.

Mild generalized parenchymal atrophy. Partially empty sella turcica.

Redemonstrated are multiple small midline lipomas along the falx.

Vascular: Flow voids maintained within the proximal large arterial
vessels.

Skull and upper cervical spine: No focal marrow lesion.

Sinuses/Orbits: Bilateral lens replacements. Minimal scattered
paranasal sinus mucosal thickening. Trace fluid within bilateral
mastoid air cells.
IMPRESSION: No evidence of acute intracranial abnormality.

Generalized parenchymal atrophy with moderate chronic small vessel
ischemic disease. Chronic lacunar infarcts within the bilateral
basal ganglia and thalami.

## 2019-02-28 MED ORDER — SODIUM CHLORIDE 0.9% FLUSH: 3.0000 mL | Freq: Once | INTRAVENOUS | Status: DC

## 2019-02-28 NOTE — ED Notes (Signed)
Patient aware she needs to get a urine spec. However states she produces urine but very little and usually only goes once a day.

## 2019-02-28 NOTE — ED Provider Notes (Signed)
Blood pressure 124/84, pulse 63, temperature 98.2 F (36.8 C), temperature source Oral, resp. rate 17, SpO2 95 %.  Assuming care from Dr. Sherry Ruffing.  In short, Bianca Myers is a 73 y.o. female with a chief complaint of Altered Mental Status and Weakness .  Refer to the original H&P for additional details.  The current plan of care is to follow up repeat BMP and UA.   07:10 PM  Patient's repeat basic metabolic panel is not hemolyzed with potassium of 4.8.  I reevaluated the patient and find her to be awake, alert, and conversational.  She is not volume overloaded.  I do not see indication for emergent dialysis.  She will keep her regularly scheduled appointment and return with any new or worsening symptoms.     Margette Fast, MD 02/28/19 1911

## 2019-02-28 NOTE — ED Notes (Signed)
Pt is a dialysis pt and does not make urine.  Dr. Laverta Baltimore made aware of not being able to obtain U/A

## 2019-02-28 NOTE — ED Notes (Signed)
Pt to bathroom via wheelchair.  Pt had BM but st's unable to obtain urine specimen  Pt alert and oriented x's 3 at this time.

## 2019-02-28 NOTE — ED Notes (Signed)
Patient transported to MRI 

## 2019-02-28 NOTE — ED Notes (Signed)
Pt resting with eyes closed at this time.  Call light in reach at bedside.

## 2019-02-28 NOTE — ED Triage Notes (Addendum)
Pt arrives a&ox2 with son.  Son states Friday pt reported "not feeling herself". Also states AMS since Saturday, before she last got dialysis. Poor appetite x 2 days. Denies any fevers, cp, n/v or sob. 91/59 in triage

## 2019-02-28 NOTE — ED Notes (Signed)
Pt requesting water to drink

## 2019-02-28 NOTE — ED Notes (Signed)
Son - Doristine Bosworth [phone] call with updates

## 2019-02-28 NOTE — ED Provider Notes (Signed)
[facility] EMERGENCY DEPARTMENT Provider Note   CSN: [id number] Arrival date & time: 02/28/19  4098     History Chief Complaint  Patient presents with  . Altered Mental Status  . Weakness    Bianca Myers is a 74 y.o. female.  The history is provided by the patient and medical records. No language interpreter was used.  Altered Mental Status Presenting symptoms: confusion and disorientation (per pt report)   Severity:  Moderate Most recent episode:  More than 2 days ago Episode history:  Single Timing:  Constant Progression:  Unchanged Context: not dementia   Associated symptoms: decreased appetite and weakness   Associated symptoms: no abdominal pain, no agitation, no fever, no headaches, no light-headedness, no nausea, no palpitations and no vomiting   Weakness Associated symptoms: diarrhea   Associated symptoms: no abdominal pain, no chest pain, no cough, no dizziness, no dysuria, no fever, no frequency, no headaches, no nausea, no shortness of breath and no vomiting        Past Medical History:  Diagnosis Date  . Anemia   . Arthritis   . Chronic kidney disease (CKD) stage G5/A1, glomerular filtration rate (GFR) less than or equal to 15 mL/min/1.73 square meter and albuminuria creatinine ratio less than 30 mg/g (HCC)   . Depression   . Diabetes mellitus ([place])   . Gout   . History of anemia due to chronic kidney disease   . History of degenerative disc disease   . Hypertension   . Pneumonia   . Secondary hyperparathyroidism ([place])   . Spondylisthesis     Patient Active Problem List   Diagnosis Date Noted  . Heart failure ([place]) 08/31/2018  . DOE (dyspnea on exertion) 08/30/2018  . Elevated brain natriuretic peptide (BNP) level 08/30/2018  . Anemia, chronic renal failure, stage 5 (HCC) 08/30/2018  . Anemia of chronic kidney failure, stage 5 ([place]) 08/27/2018  . Chronic depression 08/27/2018  . Patient is Jehovah's Witness 08/27/2018  .  Essential hypertension   . CKD (chronic kidney disease) requiring chronic dialysis ([place])   . Anemia   . Syncope 08/20/2018  . Chronic kidney disease (CKD), stage IV (severe) ([place]) 12/10/2015    Past Surgical History:  Procedure Laterality Date  . A/V FISTULAGRAM Left 12/16/2018   Procedure: A/V FISTULAGRAM;  Surgeon: Marty Heck, MD;  Location: [place] [facility];  Service: Cardiovascular;  Laterality: Left;  . AV FISTULA PLACEMENT Left 08/24/2018   Procedure: ARTERIOVENOUS (AV) FISTULA CREATION;  Surgeon: Waynetta Sandy, MD;  Location: [facility];  Service: Vascular;  Laterality: Left;  . BASCILIC VEIN TRANSPOSITION Left 11/04/2018   Procedure: BASCILIC VEIN TRANSPOSITION SECOND STAGE;  Surgeon: Waynetta Sandy, MD;  Location: [place];  Service: Vascular;  Laterality: Left;  . COLONOSCOPY    . HIP SURGERY    . IR FLUORO GUIDE CV LINE RIGHT  08/21/2018  . IR US GUIDE VASC ACCESS RIGHT  08/21/2018  . PERIPHERAL VASCULAR INTERVENTION Left 12/16/2018   Procedure: PERIPHERAL VASCULAR INTERVENTION;  Surgeon: Marty Heck, MD;  Location: [place] [facility];  Service: Cardiovascular;  Laterality: Left;  arm fistula     OB History   No obstetric history on file.     Family History  Family history unknown: Yes    Social History   Tobacco Use  . Smoking status: Former Smoker    Quit date: 1973    Years since quitting: 48.0  . Smokeless tobacco: Never Used  Substance Use Topics  . Alcohol use: Not Currently  . Drug use: Never    Home Medications Prior to Admission medications   Medication Sig Start Date End Date Taking? Authorizing Provider  acetaminophen (TYLENOL) 650 MG CR tablet Take 1,300 mg by mouth every 8 (eight) hours as needed for pain.    [provider]  amLODipine (NORVASC) 5 MG tablet Take 5 mg by mouth at bedtime.    [provider]  Ascorbic Acid (VITAMIN C) 1000 MG tablet Take 1,000 mg by mouth 2 (two) times a day.      [provider]  atorvastatin (LIPITOR) 10 MG tablet Take 10 mg by mouth at bedtime. 05/14/18   [provider]  escitalopram (LEXAPRO) 5 MG tablet Take 5 mg by mouth daily.    [provider]  ferric citrate (AURYXIA) 1 GM 210 MG(Fe) tablet Take 420 mg by mouth 3 (three) times daily with meals.    [provider]  furosemide (LASIX) 40 MG tablet Take 1 tablet (40 mg total) by mouth every Tuesday, Thursday, Saturday, and Sunday. Patient taking differently: Take 80 mg by mouth every Tuesday, Thursday, Saturday, and Sunday.  09/04/18   Nita Sells, MD  HYDROcodone-acetaminophen (NORCO/VICODIN) 5-325 MG tablet Take 1 tablet by mouth every 4 (four) hours as needed for moderate pain. 12/21/18 12/21/19  Marty Heck, MD  labetalol (NORMODYNE) 200 MG tablet Take 200 mg by mouth 2 (two) times daily.    [provider]  multivitamin (RENA-VIT) TABS tablet Take 1 tablet by mouth at bedtime. 08/27/18   Dhungel, Flonnie Overman, MD  Omega-3 Fatty Acids (FISH OIL) 1000 MG CAPS Take 1,000 mg by mouth 2 (two) times daily.     [provider]  polyethylene glycol (MIRALAX) 17 g packet Take 17 g by mouth daily as needed for moderate constipation. 08/27/18   Dhungel, Nishant, MD  Turmeric 500 MG CAPS Take 500 mg by mouth daily.     [provider]    Allergies    Patient has no known allergies.  Review of Systems   Review of Systems  Constitutional: Positive for appetite change, decreased appetite and fatigue. Negative for chills, diaphoresis and fever.  HENT: Negative for congestion.   Eyes: Negative for visual disturbance.  Respiratory: Negative for cough, chest tightness, shortness of breath, wheezing and stridor.   Cardiovascular: Negative for chest pain, palpitations and leg swelling.  Gastrointestinal: Positive for diarrhea. Negative for abdominal pain, constipation, nausea and vomiting.  Genitourinary: Negative for dysuria, flank pain  and frequency.  Musculoskeletal: Negative for back pain, neck pain and neck stiffness.  Skin: Negative for wound.  Neurological: Positive for tremors and weakness. Negative for dizziness, facial asymmetry, speech difficulty, light-headedness, numbness and headaches.  Psychiatric/Behavioral: Positive for confusion. Negative for agitation.  All other systems reviewed and are negative.   Physical Exam Updated Vital Signs BP (!) 91/59   Pulse 79   Temp 98.2 F (36.8 C) (Oral)   Resp 18   SpO2 98%   Physical Exam Vitals and nursing note reviewed.  Constitutional:      General: She is not in acute distress.    Appearance: She is well-developed. She is not ill-appearing, toxic-appearing or diaphoretic.  HENT:     Head: Normocephalic and atraumatic.     Right Ear: External ear normal.     Left Ear: External ear normal.     Nose: Nose normal. No congestion or rhinorrhea.     Mouth/Throat:  Mouth: Mucous membranes are dry.     Pharynx: No oropharyngeal exudate.  Eyes:     Conjunctiva/sclera: Conjunctivae normal.     Pupils: Pupils are equal, round, and reactive to light.  Cardiovascular:     Rate and Rhythm: Normal rate.     Pulses: Normal pulses.     Heart sounds: No murmur.  Pulmonary:     Effort: Pulmonary effort is normal. No respiratory distress.     Breath sounds: No stridor. No wheezing, rhonchi or rales.  Chest:     Chest wall: No tenderness.  Abdominal:     General: There is no distension.     Tenderness: There is no abdominal tenderness. There is no right CVA tenderness, left CVA tenderness or rebound.  Musculoskeletal:        General: No tenderness.     Cervical back: Normal range of motion and neck supple. No tenderness.     Left lower leg: No edema.  Skin:    General: Skin is warm.     Findings: No erythema or rash.  Neurological:     Mental Status: She is alert and oriented to person, place, and time.     Sensory: No sensory deficit.     Motor: Weakness  present. No abnormal muscle tone or seizure activity.     Coordination: Coordination abnormal.     Deep Tendon Reflexes: Reflexes are normal and symmetric.     Comments: Weakness in right arm compared to left.  Some ataxia in right arm.  Some tremor in right arm compared to left.  Leg strength symmetric.  Normal sensation throughout.  Symmetric smile.  Clear speech.  Patient alert and oriented x3.  Psychiatric:        Mood and Affect: Mood normal.     ED Results / Procedures / Treatments   Labs (all labs ordered are listed, but only abnormal results are displayed) Labs Reviewed  BASIC METABOLIC PANEL - Abnormal; Notable for the following components:      Result Value   Sodium 133 (*)    Potassium 5.3 (*)    Chloride 90 (*)    Glucose, Bld 210 (*)    BUN 63 (*)    Creatinine, Ser 11.21 (*)    GFR calc non Af Amer 3 (*)    GFR calc Af Amer 3 (*)    Anion gap 20 (*)    All other components within normal limits  CBC - Abnormal; Notable for the following components:   WBC 11.8 (*)    Hemoglobin 11.7 (*)    RDW 15.7 (*)    Platelets 138 (*)    All other components within normal limits  HEPATIC FUNCTION PANEL - Abnormal; Notable for the following components:   Albumin 2.9 (*)    All other components within normal limits  CBG MONITORING, ED - Abnormal; Notable for the following components:   Glucose-Capillary 188 (*)    All other components within normal limits  TROPONIN I (HIGH SENSITIVITY) - Abnormal; Notable for the following components:   Troponin I (High Sensitivity) 86 (*)    All other components within normal limits  TROPONIN I (HIGH SENSITIVITY) - Abnormal; Notable for the following components:   Troponin I (High Sensitivity) 79 (*)    All other components within normal limits  URINE CULTURE  AMMONIA  TSH  URINALYSIS, ROUTINE W REFLEX MICROSCOPIC  BASIC METABOLIC PANEL  POC SARS CORONAVIRUS 2 AG -  ED    EKG  EKG Interpretation  Date/Time:  Monday February 28 2019  07:02:52 EST Ventricular Rate:  80 PR Interval:    QRS Duration: 100 QT Interval:  378 QTC Calculation: 435 R Axis:   85 Text Interpretation: Undetermined rhythm Nonspecific T wave abnormality Abnormal ECG When compared to prior, new S1Q3T3 pattern. No STEMI Confirmed by Antony Blackbird [id number]) on 02/28/2019 7:39:46 AM   Radiology MR BRAIN WO CONTRAST  Result Date: 02/28/2019 CLINICAL DATA:  Neuro deficit, acute, stroke suspected. Altered mental status (AMS), unclear cause. Additional history provided: Several days of confusion/altered mental status as well as right arm weakness/coordination difficulties. EXAM: MRI HEAD WITHOUT CONTRAST TECHNIQUE: Multiplanar, multiecho pulse sequences of the brain and surrounding structures were obtained without intravenous contrast. COMPARISON:  Head CT 08/20/2018 FINDINGS: Brain: There is no convincing evidence of acute infarct. No evidence of intracranial mass. No midline shift or extra-axial fluid collection. No chronic intracranial blood products. Moderate patchy T2/FLAIR hyperintensity within the cerebral white matter is nonspecific, but consistent with chronic small vessel ischemic disease. Mild chronic small vessel ischemic changes also present within the brainstem. There are multiple chronic lacunar infarcts within the bilateral basal ganglia and thalami. Mild generalized parenchymal atrophy. Partially empty sella turcica. Redemonstrated are multiple small midline lipomas along the falx. Vascular: Flow voids maintained within the proximal large arterial vessels. Skull and upper cervical spine: No focal marrow lesion. Sinuses/Orbits: Bilateral lens replacements. Minimal scattered paranasal sinus mucosal thickening. Trace fluid within bilateral mastoid air cells. IMPRESSION: No evidence of acute intracranial abnormality. Generalized parenchymal atrophy with moderate chronic small vessel ischemic disease. Chronic lacunar infarcts within the bilateral basal  ganglia and thalami. Electronically Signed   By: Kellie Simmering DO   On: 02/28/2019 09:36    Procedures Procedures (including critical care time)  Medications Ordered in ED Medications  sodium chloride flush (NS) 0.9 % injection 3 mL (has no administration in time range)    ED Course  I have reviewed the triage vital signs and the nursing notes.  Pertinent labs & imaging results that were available during my care of the patient were reviewed by me and considered in my medical decision making (see chart for details).    MDM Rules/Calculators/A&P                      Bianca Myers is a 73 y.o. female with a past medical history significant for ESRD on dialysis MWF, CHF, anemia, hypertension, and diabetes who presents with 4 days of fatigue, no energy, generalized weakness, right arm weakness, diarrhea, and malaise.  Patient reports that she was post go dialysis this morning but was too tired and fatigued to be able to go.  This the first time she is ever missed dialysis.  She says that she has had no fevers, chills, congestion, or cough.  She denies any headache, neck pain or neck stiffness.  She denies any chest pain, palpitations, or shortness of breath.  She denies recent medication changes or trauma.  She reports that she has had some loose stools over the last few days but no constipation.  She still makes some urine but denies any dysuria or hematuria.  She describes that she is simply had no energy and has generalized weakness.  She does specify that her she is right-handed and her right arm has been more weak than the other areas over the last few days.  She is never had this before.  On exam, patient does indeed have weakness in  her right arm and she has to use her left arm to help raise it off the bed.  She did have some tremor and ataxia with the right finger-nose-finger test compared to left.  Her strength was symmetric in the legs.  She had normal sensation throughout.  Symmetric  smile.  Normal extraocular movements.  Symmetric pupils.  Patient was alert and oriented on my exam with person, place, and year.  She thinks that she has been slightly confused and her son reportedly thought that she was more confused for the last few days as well.  She says she does not have much to eat or drink over the last few days with her diarrhea.  She denies any known Covid contacts.  Clinically I am concerned about multiple things with the patient including dehydration from the diarrhea in the context of dialysis.  She also reports is not eating or drinking so electrolyte abnormality is also considered.  With the diarrhea and the Covid pandemic, will get rapid Covid test.  With the new right arm weakness, will get MRI to further evaluate with symptoms ongoing for several days.  We will get labs to also make sure she is not emergent dialysis today.  Anticipate reassessment after work-up.  4:47 PM Work-up began to return somewhat reassuring.  Her troponin is elevated but downtrending and she has no chest pain or shortness of breath with her fatigue.  Her ammonia was elevated.  Thyroid normal.  Hepatic function reassuring.  Metabolic panel was hemolyzed with a potassium of 5.3 and elevated creatinine.  Creatinine is expected to be elevated with her missing dialysis today however, will repeat the metabolic panel given the hemolysis.  CBC did show leukocytosis and mild anemia.  MRI brain did not show acute stroke but there is likely evidence of old strokes.  Chest x-ray not show pneumonia.  Rapid Covid test is negative.  Had a shared conversation with patient and she is agreeable to getting a urinalysis now that she is able to urinate as well as getting a repeat BMP.  If metabolic panel and urine are reassuring and she is feeling better, anticipate discharge home to get dialysis as an outpatient.  If any findings on the metabolic panel are more concerning, would likely touch base with nephrology to to  see if she needs dialysis tonight.  Patient is wanting to go home if possible.  Anticipate discharge home after work-up.  Care transferred to Dr. Laverta Baltimore while awaiting results of repeat BMP and urinalysis.  Final Clinical Impression(s) / ED Diagnoses Final diagnoses:  Fatigue, unspecified type    Clinical Impression: 1. Fatigue, unspecified type     Disposition:  Care transferred to Dr. Carlyon Shadow awaiting results of repeat BMP and urinalysis.  This note was prepared with assistance of Systems analyst. Occasional wrong-word or sound-a-like substitutions may have occurred due to the inherent limitations of voice recognition software.      Zuly Belkin, Gwenyth Allegra, MD 02/28/19 [phone]

## 2019-02-28 NOTE — Discharge Instructions (Signed)
You were seen in the emergency department today with fatigue type symptoms.  I expect this will resolve with your next dialysis session.  Your labs today were in normal range and you do not require emergency dialysis.  If you develop any new or suddenly worsening symptoms you should return to the emergency department otherwise keep your regularly scheduled dialysis appointment.

## 2019-03-04 HISTORY — PX: SHOULDER SURGERY: SHX246

## 2019-03-09 ENCOUNTER — Emergency Department (HOSPITAL_COMMUNITY): Payer: Medicare HMO

## 2019-03-09 ENCOUNTER — Other Ambulatory Visit: Payer: Self-pay

## 2019-03-09 ENCOUNTER — Inpatient Hospital Stay (HOSPITAL_COMMUNITY)
Admission: EM | Admit: 2019-03-09 | Discharge: 2019-03-15 | DRG: 177 | Disposition: A | Discharge status: Home / Self Care | Payer: Medicare HMO | Source: Other Acute Inpatient Hospital | Attending: Internal Medicine | Admitting: Internal Medicine

## 2019-03-09 DIAGNOSIS — E1122 Type 2 diabetes mellitus with diabetic chronic kidney disease: Secondary | ICD-10-CM | POA: Diagnosis present

## 2019-03-09 DIAGNOSIS — R197 Diarrhea, unspecified: Secondary | ICD-10-CM | POA: Diagnosis present

## 2019-03-09 DIAGNOSIS — E8889 Other specified metabolic disorders: Secondary | ICD-10-CM | POA: Diagnosis present

## 2019-03-09 DIAGNOSIS — Z87891 Personal history of nicotine dependence: Secondary | ICD-10-CM

## 2019-03-09 DIAGNOSIS — E8770 Fluid overload, unspecified: Secondary | ICD-10-CM | POA: Diagnosis present

## 2019-03-09 DIAGNOSIS — I120 Hypertensive chronic kidney disease with stage 5 chronic kidney disease or end stage renal disease: Secondary | ICD-10-CM | POA: Diagnosis present

## 2019-03-09 DIAGNOSIS — IMO0001 Reserved for inherently not codable concepts without codable children: Secondary | ICD-10-CM | POA: Diagnosis present

## 2019-03-09 DIAGNOSIS — R7881 Bacteremia: Secondary | ICD-10-CM | POA: Diagnosis present

## 2019-03-09 DIAGNOSIS — E871 Hypo-osmolality and hyponatremia: Secondary | ICD-10-CM | POA: Diagnosis present

## 2019-03-09 DIAGNOSIS — Z992 Dependence on renal dialysis: Secondary | ICD-10-CM | POA: Diagnosis not present

## 2019-03-09 DIAGNOSIS — J189 Pneumonia, unspecified organism: Secondary | ICD-10-CM | POA: Diagnosis present

## 2019-03-09 DIAGNOSIS — E876 Hypokalemia: Secondary | ICD-10-CM | POA: Diagnosis present

## 2019-03-09 DIAGNOSIS — J15211 Pneumonia due to Methicillin susceptible Staphylococcus aureus: Secondary | ICD-10-CM | POA: Diagnosis present

## 2019-03-09 DIAGNOSIS — D631 Anemia in chronic kidney disease: Secondary | ICD-10-CM | POA: Diagnosis present

## 2019-03-09 DIAGNOSIS — R0603 Acute respiratory distress: Secondary | ICD-10-CM | POA: Diagnosis present

## 2019-03-09 DIAGNOSIS — B9561 Methicillin susceptible Staphylococcus aureus infection as the cause of diseases classified elsewhere: Secondary | ICD-10-CM | POA: Diagnosis present

## 2019-03-09 DIAGNOSIS — M75101 Unspecified rotator cuff tear or rupture of right shoulder, not specified as traumatic: Secondary | ICD-10-CM | POA: Diagnosis present

## 2019-03-09 DIAGNOSIS — A4901 Methicillin susceptible Staphylococcus aureus infection, unspecified site: Secondary | ICD-10-CM

## 2019-03-09 DIAGNOSIS — N186 End stage renal disease: Secondary | ICD-10-CM

## 2019-03-09 DIAGNOSIS — N2581 Secondary hyperparathyroidism of renal origin: Secondary | ICD-10-CM | POA: Diagnosis present

## 2019-03-09 DIAGNOSIS — Z862 Personal history of diseases of the blood and blood-forming organs and certain disorders involving the immune mechanism: Secondary | ICD-10-CM

## 2019-03-09 DIAGNOSIS — Z20822 Contact with and (suspected) exposure to covid-19: Secondary | ICD-10-CM | POA: Diagnosis present

## 2019-03-09 DIAGNOSIS — T8241XA Breakdown (mechanical) of vascular dialysis catheter, initial encounter: Secondary | ICD-10-CM

## 2019-03-09 DIAGNOSIS — Z79899 Other long term (current) drug therapy: Secondary | ICD-10-CM | POA: Diagnosis not present

## 2019-03-09 DIAGNOSIS — I10 Essential (primary) hypertension: Secondary | ICD-10-CM | POA: Diagnosis present

## 2019-03-09 DIAGNOSIS — F329 Major depressive disorder, single episode, unspecified: Secondary | ICD-10-CM | POA: Diagnosis present

## 2019-03-09 DIAGNOSIS — R682 Dry mouth, unspecified: Secondary | ICD-10-CM | POA: Diagnosis present

## 2019-03-09 DIAGNOSIS — Z789 Other specified health status: Secondary | ICD-10-CM | POA: Diagnosis present

## 2019-03-09 DIAGNOSIS — E119 Type 2 diabetes mellitus without complications: Secondary | ICD-10-CM

## 2019-03-09 DIAGNOSIS — F32A Depression, unspecified: Secondary | ICD-10-CM | POA: Diagnosis present

## 2019-03-09 DIAGNOSIS — G8929 Other chronic pain: Secondary | ICD-10-CM | POA: Diagnosis present

## 2019-03-09 DIAGNOSIS — Z9981 Dependence on supplemental oxygen: Secondary | ICD-10-CM | POA: Diagnosis not present

## 2019-03-09 DIAGNOSIS — T82898A Other specified complication of vascular prosthetic devices, implants and grafts, initial encounter: Secondary | ICD-10-CM | POA: Diagnosis not present

## 2019-03-09 DIAGNOSIS — I350 Nonrheumatic aortic (valve) stenosis: Secondary | ICD-10-CM | POA: Diagnosis present

## 2019-03-09 LAB — URINALYSIS, MICROSCOPIC (REFLEX)

## 2019-03-09 LAB — CBC
HCT: 28.5 % — ABNORMAL LOW (ref 36.0–46.0)
Hemoglobin: 9.5 g/dL — ABNORMAL LOW (ref 12.0–15.0)
MCH: 30 pg (ref 26.0–34.0)
MCHC: 33.3 g/dL (ref 30.0–36.0)
MCV: 89.9 fL (ref 80.0–100.0)
Platelets: 240 10*3/uL (ref 150–400)
RBC: 3.17 MIL/uL — ABNORMAL LOW (ref 3.87–5.11)
RDW: 14.8 % (ref 11.5–15.5)
WBC: 25.5 10*3/uL — ABNORMAL HIGH (ref 4.0–10.5)
nRBC: 0 % (ref 0.0–0.2)

## 2019-03-09 LAB — I-STAT CHEM 8, ED
BUN: 40 mg/dL — ABNORMAL HIGH (ref 8–23)
Calcium, Ion: 1.06 mmol/L — ABNORMAL LOW (ref 1.15–1.40)
Chloride: 96 mmol/L — ABNORMAL LOW (ref 98–111)
Creatinine, Ser: 8.5 mg/dL — ABNORMAL HIGH (ref 0.44–1.00)
Glucose, Bld: 175 mg/dL — ABNORMAL HIGH (ref 70–99)
HCT: 32 % — ABNORMAL LOW (ref 36.0–46.0)
Hemoglobin: 10.9 g/dL — ABNORMAL LOW (ref 12.0–15.0)
Potassium: 4 mmol/L (ref 3.5–5.1)
Sodium: 133 mmol/L — ABNORMAL LOW (ref 135–145)
TCO2: 25 mmol/L (ref 22–32)

## 2019-03-09 LAB — URINALYSIS, ROUTINE W REFLEX MICROSCOPIC
Bilirubin Urine: NEGATIVE
Glucose, UA: NEGATIVE mg/dL
Ketones, ur: NEGATIVE mg/dL
Nitrite: NEGATIVE
Protein, ur: >300 mg/dL — AB
Specific Gravity, Urine: 1.015 (ref 1.005–1.030)
pH: 6.5 (ref 5.0–8.0)

## 2019-03-09 LAB — LACTIC ACID, PLASMA
Lactic Acid, Venous: 1.1 mmol/L (ref 0.5–1.9)
Lactic Acid, Venous: 1.2 mmol/L (ref 0.5–1.9)

## 2019-03-09 LAB — COMPREHENSIVE METABOLIC PANEL
ALT: 19 U/L (ref 0–44)
AST: 15 U/L (ref 15–41)
Albumin: 2.1 g/dL — ABNORMAL LOW (ref 3.5–5.0)
Alkaline Phosphatase: 75 U/L (ref 38–126)
Anion gap: 15 (ref 5–15)
BUN: 49 mg/dL — ABNORMAL HIGH (ref 8–23)
CO2: 25 mmol/L (ref 22–32)
Calcium: 8.8 mg/dL — ABNORMAL LOW (ref 8.9–10.3)
Chloride: 96 mmol/L — ABNORMAL LOW (ref 98–111)
Creatinine, Ser: 8.53 mg/dL — ABNORMAL HIGH (ref 0.44–1.00)
GFR calc Af Amer: 5 mL/min — ABNORMAL LOW (ref >60)
GFR calc non Af Amer: 4 mL/min — ABNORMAL LOW (ref >60)
Glucose, Bld: 162 mg/dL — ABNORMAL HIGH (ref 70–99)
Potassium: 4.2 mmol/L (ref 3.5–5.1)
Sodium: 136 mmol/L (ref 135–145)
Total Bilirubin: 0.4 mg/dL (ref 0.3–1.2)
Total Protein: 7.2 g/dL (ref 6.5–8.1)

## 2019-03-09 LAB — LIPASE, BLOOD: Lipase: 15 U/L (ref 11–51)

## 2019-03-09 LAB — MRSA PCR SCREENING: MRSA by PCR: NEGATIVE

## 2019-03-09 LAB — HEMOGLOBIN A1C
Hgb A1c MFr Bld: 6.1 % — ABNORMAL HIGH (ref 4.8–5.6)
Mean Plasma Glucose: 128.37 mg/dL

## 2019-03-09 LAB — POC SARS CORONAVIRUS 2 AG -  ED: SARS Coronavirus 2 Ag: NEGATIVE

## 2019-03-09 LAB — PROCALCITONIN: Procalcitonin: 12.74 ng/mL

## 2019-03-09 LAB — GLUCOSE, CAPILLARY: Glucose-Capillary: 173 mg/dL — ABNORMAL HIGH (ref 70–99)

## 2019-03-09 LAB — SARS CORONAVIRUS 2 (TAT 6-24 HRS): SARS Coronavirus 2: NEGATIVE

## 2019-03-09 IMAGING — CR DG CHEST 2V
2 series · 2 of 2 positions shown · non-contrast
Comparison: [DATE]

CLINICAL DATA: Shortness of breath and weakness, went to dialysis
this morning, history end-stage renal disease, diabetes mellitus,
hypertension, secondary hyperparathyroidism, former smoker

EXAM:
CHEST - 2 VIEW

[chest lat]
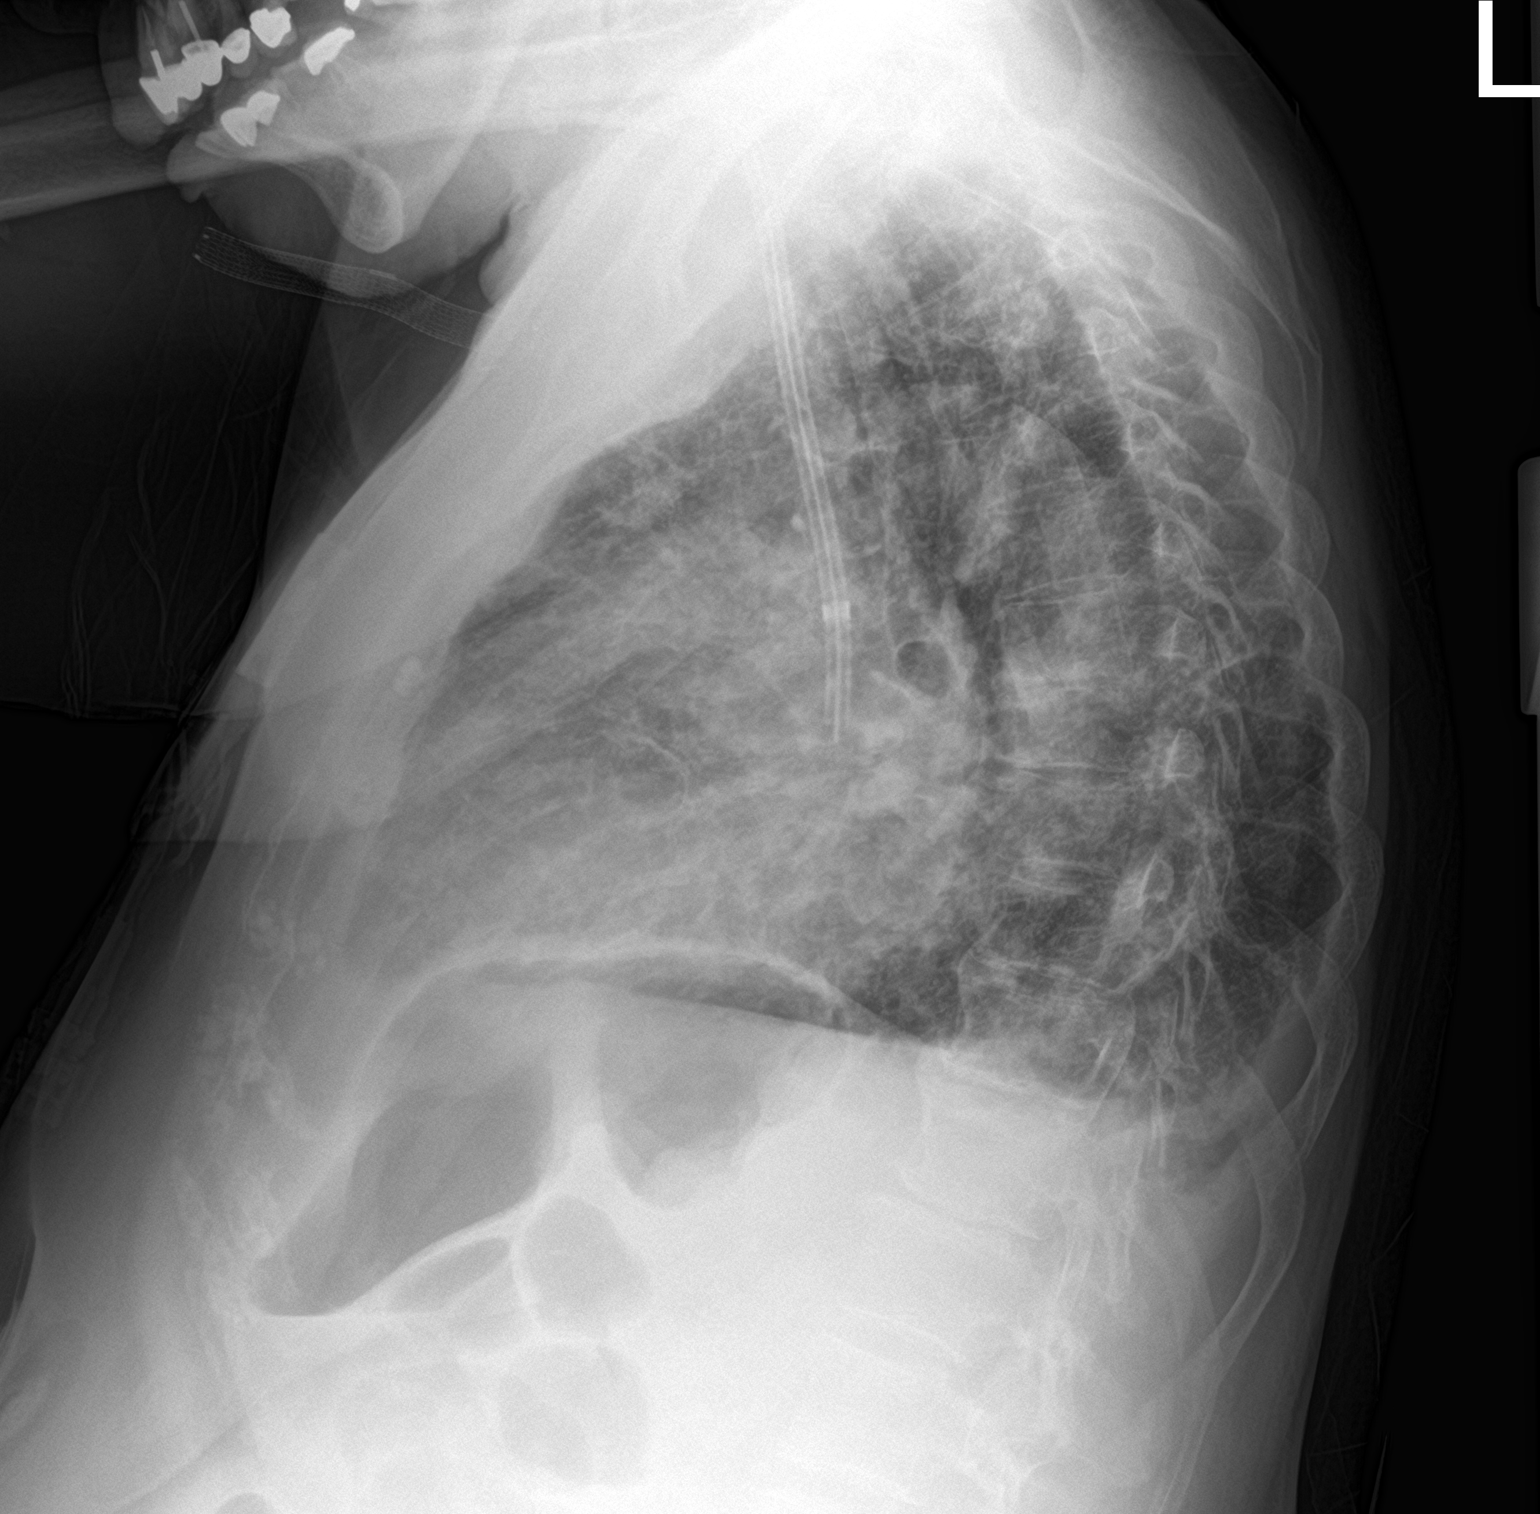

[chest ap]
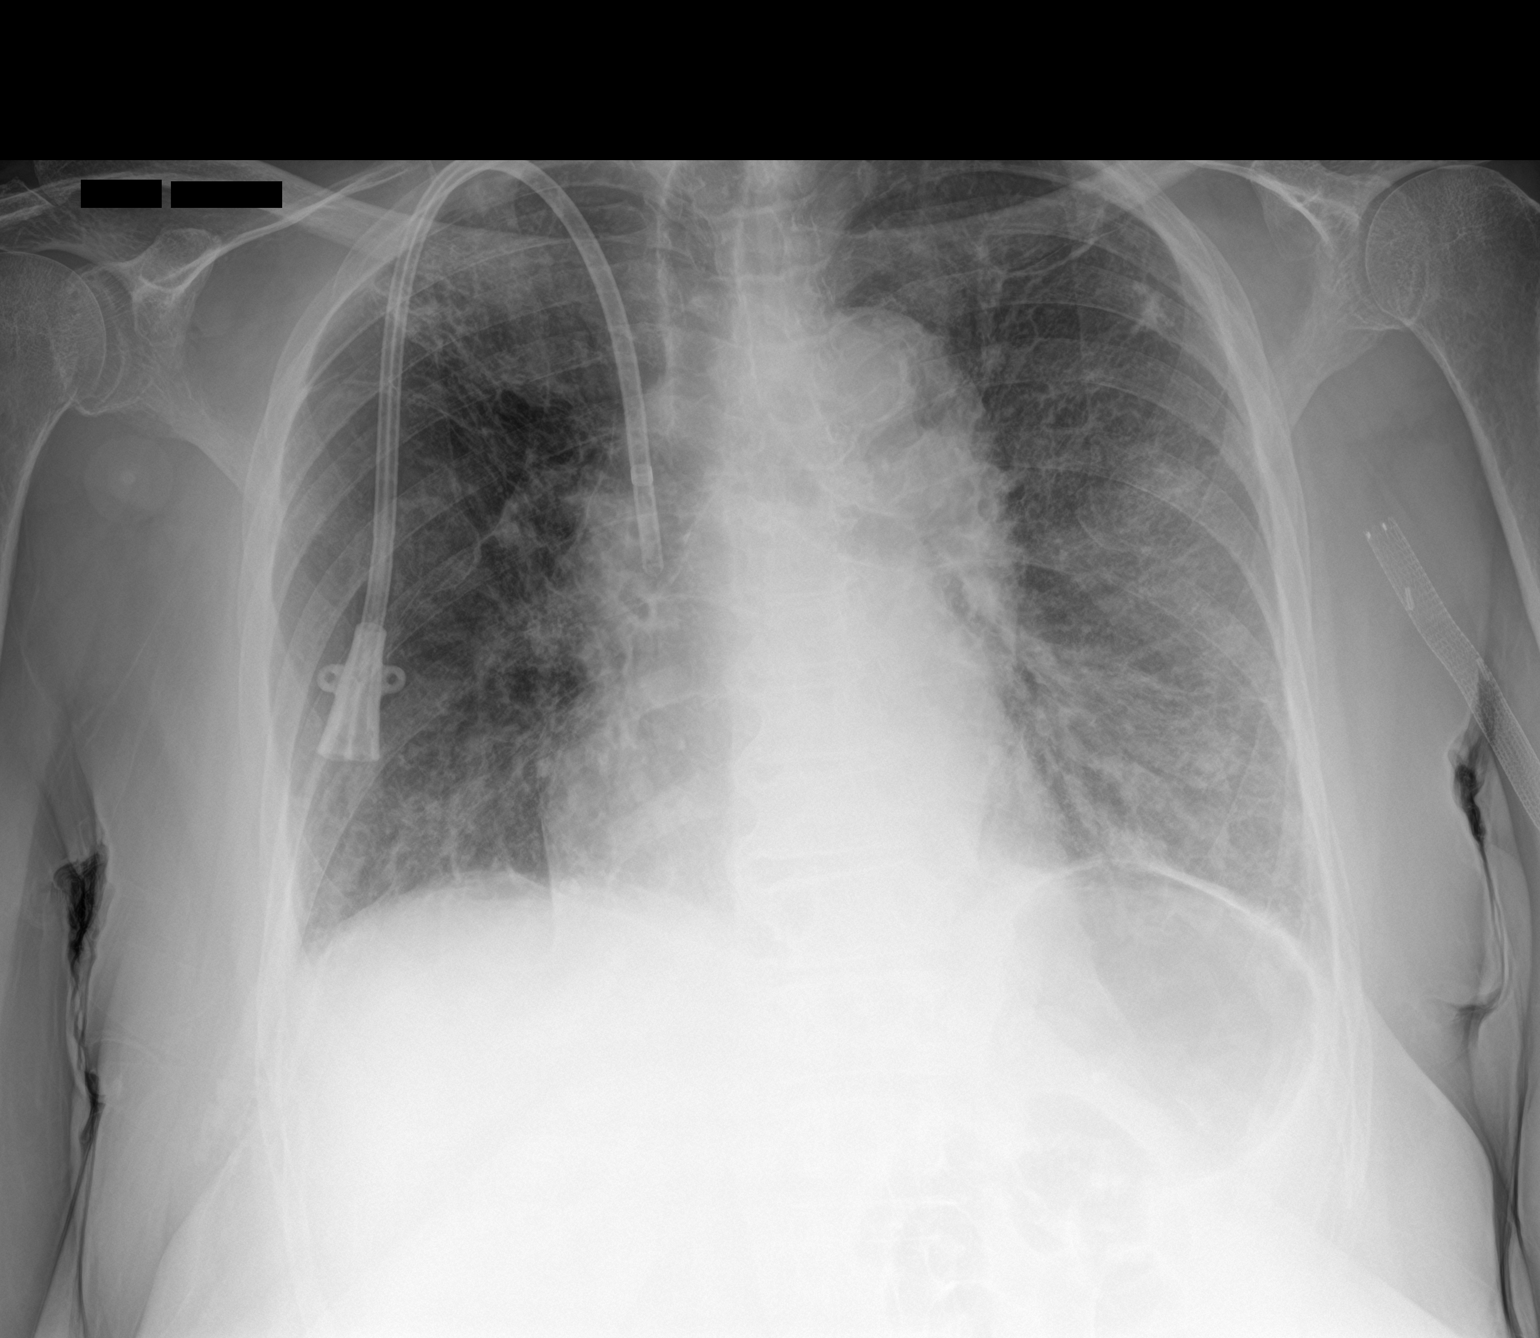

[2 of 2 positions shown; findings below may reference images not displayed]

FINDINGS: RIGHT jugular dual-lumen dialysis catheter with tip projecting over
SVC.

Normal heart size, mediastinal contours, and pulmonary vascularity.

Atherosclerotic calcification aorta.

RIGHT upper lobe scarring and volume loss.

Infiltrates are identified at the mid to lower LEFT lung and at the
base of the RIGHT lung, could represent asymmetric pulmonary edema
or infection.

Tiny BILATERAL pleural effusions blunt the posterior costophrenic
angles.

No pneumothorax.

Bones demineralized.

Vascular stents LEFT upper extremity.
IMPRESSION: Asymmetric pulmonary infiltrates LEFT greater than RIGHT question
asymmetric pulmonary edema versus multifocal infection.

RIGHT upper lobe scarring and volume loss.

Aortic Atherosclerosis ([RY]-[RY]).

## 2019-03-09 MED ORDER — HEPARIN SODIUM (PORCINE) 5000 UNIT/ML IJ SOLN
5000.0000 [IU] | Freq: Three times a day (TID) | INTRAMUSCULAR | Status: DC
  Administered 2019-03-09 – 2019-03-15 (×14): 5000 [IU] via SUBCUTANEOUS
  Filled 2019-03-09 (×14): qty 1

## 2019-03-09 MED ORDER — ACETAMINOPHEN 650 MG RE SUPP: 650.0000 mg | Freq: Four times a day (QID) | RECTAL | Status: DC | PRN

## 2019-03-09 MED ORDER — PENTAFLUOROPROP-TETRAFLUOROETH EX AERO: 1.0000 "application " | INHALATION_SPRAY | CUTANEOUS | Status: DC | PRN

## 2019-03-09 MED ORDER — DEXTROSE 5 % IV SOLN
250.0000 mg | INTRAVENOUS | Status: DC
  Filled 2019-03-09: qty 250

## 2019-03-09 MED ORDER — ALTEPLASE 2 MG IJ SOLR: 2.0000 mg | Freq: Once | INTRAMUSCULAR | Status: DC | PRN

## 2019-03-09 MED ORDER — SODIUM CHLORIDE 0.9 % IV SOLN: 2.0000 g | INTRAVENOUS | Status: DC

## 2019-03-09 MED ORDER — LIDOCAINE-PRILOCAINE 2.5-2.5 % EX CREA: 1.0000 "application " | TOPICAL_CREAM | CUTANEOUS | Status: DC

## 2019-03-09 MED ORDER — FERRIC CITRATE 1 GM 210 MG(FE) PO TABS
420.0000 mg | ORAL_TABLET | Freq: Three times a day (TID) | ORAL | Status: DC
  Administered 2019-03-10 – 2019-03-15 (×10): 420 mg via ORAL
  Filled 2019-03-09 (×10): qty 2

## 2019-03-09 MED ORDER — DARBEPOETIN ALFA 25 MCG/0.42ML IJ SOSY
PREFILLED_SYRINGE | INTRAMUSCULAR | Status: AC
  Administered 2019-03-09: 25 ug via INTRAVENOUS
  Filled 2019-03-09: qty 0.42

## 2019-03-09 MED ORDER — RENA-VITE PO TABS
1.0000 | ORAL_TABLET | Freq: Every day | ORAL | Status: DC
  Administered 2019-03-09 – 2019-03-14 (×6): 1 via ORAL
  Filled 2019-03-09 (×6): qty 1

## 2019-03-09 MED ORDER — SODIUM CHLORIDE 0.9 % IV SOLN
1.0000 g | Freq: Once | INTRAVENOUS | Status: AC
  Administered 2019-03-09: 1 g via INTRAVENOUS
  Filled 2019-03-09: qty 10

## 2019-03-09 MED ORDER — HEPARIN 1000 UNIT/ML FOR PERITONEAL DIALYSIS
3200.0000 [IU] | INTRAMUSCULAR | Status: DC
  Filled 2019-03-09: qty 3.2

## 2019-03-09 MED ORDER — HEPARIN SODIUM (PORCINE) 1000 UNIT/ML IJ SOLN
INTRAMUSCULAR | Status: AC
  Administered 2019-03-09: 3200 [IU] via INTRAPERITONEAL
  Filled 2019-03-09: qty 4

## 2019-03-09 MED ORDER — AMLODIPINE BESYLATE 5 MG PO TABS
5.0000 mg | ORAL_TABLET | Freq: Every day | ORAL | Status: DC
  Administered 2019-03-09 – 2019-03-14 (×6): 5 mg via ORAL
  Filled 2019-03-09 (×6): qty 1

## 2019-03-09 MED ORDER — LIDOCAINE-PRILOCAINE 2.5-2.5 % EX CREA: 1.0000 "application " | TOPICAL_CREAM | CUTANEOUS | Status: DC | PRN

## 2019-03-09 MED ORDER — PRO-STAT SUGAR FREE PO LIQD
30.0000 mL | Freq: Two times a day (BID) | ORAL | Status: DC
  Filled 2019-03-09 (×2): qty 30

## 2019-03-09 MED ORDER — INSULIN ASPART 100 UNIT/ML ~~LOC~~ SOLN
0.0000 [IU] | Freq: Three times a day (TID) | SUBCUTANEOUS | Status: DC
  Administered 2019-03-10 (×2): 1 [IU] via SUBCUTANEOUS
  Administered 2019-03-11: 3 [IU] via SUBCUTANEOUS
  Administered 2019-03-12 – 2019-03-15 (×3): 1 [IU] via SUBCUTANEOUS

## 2019-03-09 MED ORDER — HEPARIN SODIUM (PORCINE) 1000 UNIT/ML DIALYSIS
1000.0000 [IU] | INTRAMUSCULAR | Status: DC | PRN
  Administered 2019-03-11: 3200 [IU] via INTRAVENOUS_CENTRAL

## 2019-03-09 MED ORDER — LIDOCAINE HCL (PF) 1 % IJ SOLN: 5.0000 mL | INTRAMUSCULAR | Status: DC | PRN

## 2019-03-09 MED ORDER — SODIUM CHLORIDE 0.9 % IV SOLN: 100.0000 mL | INTRAVENOUS | Status: DC | PRN

## 2019-03-09 MED ORDER — LACTATED RINGERS IV BOLUS
500.0000 mL | Freq: Once | INTRAVENOUS | Status: AC
  Administered 2019-03-09: 500 mL via INTRAVENOUS

## 2019-03-09 MED ORDER — ACETAMINOPHEN 325 MG PO TABS
650.0000 mg | ORAL_TABLET | Freq: Once | ORAL | Status: AC
  Administered 2019-03-09: 650 mg via ORAL
  Filled 2019-03-09: qty 2

## 2019-03-09 MED ORDER — ONDANSETRON HCL 4 MG PO TABS: 4.0000 mg | ORAL_TABLET | Freq: Four times a day (QID) | ORAL | Status: DC | PRN

## 2019-03-09 MED ORDER — POLYETHYLENE GLYCOL 3350 17 G PO PACK: 17.0000 g | PACK | Freq: Every day | ORAL | Status: DC | PRN

## 2019-03-09 MED ORDER — CHLORHEXIDINE GLUCONATE CLOTH 2 % EX PADS
6.0000 | MEDICATED_PAD | Freq: Every day | CUTANEOUS | Status: DC
  Administered 2019-03-10 – 2019-03-14 (×5): 6 via TOPICAL

## 2019-03-09 MED ORDER — ACETAMINOPHEN 325 MG PO TABS
650.0000 mg | ORAL_TABLET | Freq: Four times a day (QID) | ORAL | Status: DC | PRN
  Administered 2019-03-09 – 2019-03-15 (×13): 650 mg via ORAL
  Filled 2019-03-09 (×13): qty 2

## 2019-03-09 MED ORDER — DARBEPOETIN ALFA 25 MCG/0.42ML IJ SOSY
25.0000 ug | PREFILLED_SYRINGE | INTRAMUSCULAR | Status: DC
  Filled 2019-03-09: qty 0.42

## 2019-03-09 MED ORDER — SODIUM CHLORIDE 0.9 % IV BOLUS
250.0000 mL | Freq: Once | INTRAVENOUS | Status: AC
  Administered 2019-03-09: 250 mL via INTRAVENOUS

## 2019-03-09 MED ORDER — ORAL CARE MOUTH RINSE
15.0000 mL | Freq: Two times a day (BID) | OROMUCOSAL | Status: DC
  Administered 2019-03-10 – 2019-03-14 (×5): 15 mL via OROMUCOSAL

## 2019-03-09 MED ORDER — ATORVASTATIN CALCIUM 10 MG PO TABS
10.0000 mg | ORAL_TABLET | Freq: Every day | ORAL | Status: DC
  Administered 2019-03-09 – 2019-03-14 (×6): 10 mg via ORAL
  Filled 2019-03-09 (×6): qty 1

## 2019-03-09 MED ORDER — SODIUM CHLORIDE 0.9 % IV SOLN
500.0000 mg | Freq: Once | INTRAVENOUS | Status: AC
  Administered 2019-03-09: 500 mg via INTRAVENOUS
  Filled 2019-03-09: qty 500

## 2019-03-09 MED ORDER — LABETALOL HCL 200 MG PO TABS
200.0000 mg | ORAL_TABLET | Freq: Two times a day (BID) | ORAL | Status: DC
  Administered 2019-03-09 – 2019-03-15 (×12): 200 mg via ORAL
  Filled 2019-03-09 (×12): qty 1

## 2019-03-09 MED ORDER — SODIUM CHLORIDE 0.9 % IV SOLN
2.0000 g | Freq: Once | INTRAVENOUS | Status: AC
  Administered 2019-03-09: 2 g via INTRAVENOUS
  Filled 2019-03-09: qty 2

## 2019-03-09 MED ORDER — ONDANSETRON HCL 4 MG/2ML IJ SOLN: 4.0000 mg | Freq: Four times a day (QID) | INTRAMUSCULAR | Status: DC | PRN

## 2019-03-09 NOTE — Progress Notes (Signed)
New Admission Note:   Arrival Method: Arrived from hemodialysis Mental Orientation: Alert and oriented x4 Telemetry: Box #21 Assessment: Completed Skin: Intact IV: Rt FA Pain: Denies Tubes: N/A Safety Measures: Safety Fall Prevention Plan has been discussed.  Admission: Completed 5MW Orientation: Patient has been orientated to the room, unit and staff.  Family: None at bedside  Orders have been reviewed and implemented. Will continue to monitor the patient. Call light has been placed within reach and bed alarm has been activated.   Domanique Luckett [employer], RN-BC Phone number: [phone]

## 2019-03-09 NOTE — Progress Notes (Signed)
Pharmacy Antibiotic Note  Bianca Myers is a 74 y.o. female admitted on 03/09/2019 with pneumonia.  Pharmacy has been consulted for cefepime dosing. Pt is afebrile and WBC is elevated at 25.5. Pt with history of ESRD on HD. She already received a dose of azithromycin and ceftriaxone.   Plan: Cefepime 2gm IV x 1 then 2gm QHD F/u renal plans, C&S, clinical status and LOT *Pharmacy will sign off as no dose adjustments are anticipated. Thank you for the consult!     Temp (24hrs), Avg:99.7 F (37.6 C), Min:99.7 F (37.6 C), Max:99.7 F (37.6 C)  Recent Labs  Lab 03/09/19 0848 03/09/19 0902 03/09/19 1037 03/09/19 1048  WBC 25.5*  --   --   --   CREATININE  --  8.50* 8.53*  --   LATICACIDVEN  --   --   --  1.1    CrCl cannot be calculated (Unknown ideal weight.).    No Known Allergies  Thank you for allowing pharmacy to be a part of this patient's care.  Lucero Auzenne, Rande Lawman 03/09/2019 12:40 PM

## 2019-03-09 NOTE — H&P (Signed)
Date: 03/09/2019               Patient Name:  Bianca Myers MRN: [id number]  DOB: 08/17/1945 Age / Sex: 74 y.o., female   PCP: [name], MD         Medical Service: Internal Medicine Teaching Service         Attending Physician: Dr. Lucious Groves, DO    First Contact: Dr. Madilyn Fireman Pager: [phone]  Second Contact: Dr. Eileen Stanford Pager: [phone]       After Hours (After 5p/  First Contact Pager: [phone]  weekends / holidays): Second Contact Pager: [phone]   Chief Complaint: [facility]  History of Present Illness: Bianca Myers is a 74 y.o female with ESRD on HD, HTN and DM who presented to the ED with progressive SHOB and generalized fatigue. History was obtained via the patient and through chart review.   Approximately 10 days she noticed worsening SHOB and diarrhea. The [facility] seems to be progressive and she has been unable to ambulate because of the [facility]. Her diarrhea is primarily watery without any mucus or blood. She has been having 8-10 bowel movements per day but today has not had any further episodes, likely because she has had no po intake. She presented to the ED on 12/28 for evaluation and at that time was told everything was fine. She was subsequently discharged home. Since that time her symptoms have worsened. In addition she has had extreme fatigue, generalized weakness, lack of appetite/PO intake, subjective fevers, and some headaches. She denies recent sick contacts, recent use of antibiotics, rhinorrhea, sore throat, dysuria, abdominal pain, N/V, new rash, myalgias, arthralgias.   Today she went to HD and was told she seemed distressed and should come to the ED. Her last couple sessions they have not been able to pull any fluid with her HD.   Meds:  No current facility-administered medications on file prior to encounter.   Current Outpatient Medications on File Prior to Encounter  Medication Sig Dispense Refill  . acetaminophen (TYLENOL) 650 MG CR tablet Take  1,300 mg by mouth every 8 (eight) hours as needed for pain.    Marland Kitchen amLODipine (NORVASC) 5 MG tablet Take 5 mg by mouth at bedtime.    . Ascorbic Acid (VITAMIN C) 1000 MG tablet Take 1,000 mg by mouth 2 (two) times a day.     Marland Kitchen atorvastatin (LIPITOR) 10 MG tablet Take 10 mg by mouth at bedtime.    . ferric citrate (AURYXIA) 1 GM 210 MG(Fe) tablet Take 420 mg by mouth 3 (three) times daily with meals.    . furosemide (LASIX) 40 MG tablet Take 1 tablet (40 mg total) by mouth every Tuesday, Thursday, Saturday, and Sunday. (Patient taking differently: Take 80 mg by mouth every Tuesday, Thursday, Saturday, and Sunday. ) 30 tablet 0  . HYDROcodone-acetaminophen (NORCO/VICODIN) 5-325 MG tablet Take 1 tablet by mouth every 4 (four) hours as needed for moderate pain. (Patient not taking: Reported on 02/28/2019) 20 tablet 0  . labetalol (NORMODYNE) 200 MG tablet Take 200 mg by mouth 2 (two) times daily.    Marland Kitchen lidocaine-prilocaine (EMLA) cream Apply 1 application topically as directed. Prior to Dialysis    . multivitamin (RENA-VIT) TABS tablet Take 1 tablet by mouth at bedtime. 30 tablet 0  . Omega-3 Fatty Acids (FISH OIL) 1000 MG CAPS Take 1,000 mg by mouth 2 (two) times daily.     . polyethylene glycol (MIRALAX) 17 g packet Take  17 g by mouth daily as needed for moderate constipation. 14 each 0  . Turmeric 500 MG CAPS Take 500 mg by mouth daily.      Allergies: Allergies as of 03/09/2019  . (No Known Allergies)   Past Medical History:  Diagnosis Date  . Anemia   . Arthritis   . Chronic kidney disease (CKD) stage G5/A1, glomerular filtration rate (GFR) less than or equal to 15 mL/Bianca/1.73 square meter and albuminuria creatinine ratio less than 30 mg/g (HCC)   . Depression   . Diabetes mellitus ([place])   . Gout   . History of anemia due to chronic kidney disease   . History of degenerative disc disease   . Hypertension   . Pneumonia   . Secondary hyperparathyroidism ([place])   . Spondylisthesis   ESRD,  diet controlled DM, distant and currently inactive sarcoidosis, on 3 L O2 at home for unknown reason   Family History  Family history unknown: Yes   Social History   Socioeconomic History  . Marital status: Widowed    Spouse name: Not on file  . Number of children: Not on file  . Years of education: Not on file  . Highest education level: Not on file  Occupational History  . Not on file  Tobacco Use  . Smoking status: Former Smoker    Quit date: 1973    Years since quitting: 48.0  . Smokeless tobacco: Never Used  Substance and Sexual Activity  . Alcohol use: Not Currently  . Drug use: Never  . Sexual activity: Not on file  Other Topics Concern  . Not on file  Social History Narrative  . Not on file   Social Determinants of Health   Financial Resource Strain:   . Difficulty of Paying Living Expenses: Not on file  Food Insecurity:   . Worried About Charity fundraiser in the Last Year: Not on file  . Ran Out of Food in the Last Year: Not on file  Transportation Needs:   . Lack of Transportation (Medical): Not on file  . Lack of Transportation (Non-Medical): Not on file  Physical Activity:   . Days of Exercise per Week: Not on file  . Minutes of Exercise per Session: Not on file  Stress:   . Feeling of Stress : Not on file  Social Connections:   . Frequency of Communication with Friends and Family: Not on file  . Frequency of Social Gatherings with Friends and Family: Not on file  . Attends Religious Services: Not on file  . Active Member of Clubs or Organizations: Not on file  . Attends Archivist Meetings: Not on file  . Marital Status: Not on file   Social History: Lives at home with her son. Denies the use of tobacco products. Occasional EtOH use.   Review of Systems: A complete ROS was negative except as per HPI.   Physical Exam: Blood pressure (!) 156/77, pulse 65, temperature 99.7 F (37.6 C), temperature source Oral, resp. rate (!) 35, SpO2  100 %.  Physical Exam  Constitutional: She is well-developed, well-nourished, and in no distress.  HENT:  Head: Normocephalic and atraumatic.  Cardiovascular: Regular rhythm and normal heart sounds.  No murmur heard. tachy  Pulmonary/Chest:  Slightly increased effort; diffuse rhonchi and some expiratory wheezing   Abdominal: Soft. Bowel sounds are normal. She exhibits no distension. There is no abdominal tenderness.  Musculoskeletal:        General: No edema. Normal  range of motion.     Cervical back: Normal range of motion.  Neurological: She is alert.  Skin: Skin is warm and dry.  Psychiatric: Affect normal.  Nursing note and vitals reviewed.  EKG: personally reviewed my interpretation is NSR  CXR: personally reviewed my interpretation is multifocal pneumonia   DG Chest 2 View  Result Date: 03/09/2019 CLINICAL DATA:  Shortness of breath and weakness, went to dialysis this morning, history end-stage renal disease, diabetes mellitus, hypertension, secondary hyperparathyroidism, former smoker EXAM: CHEST - 2 VIEW COMPARISON:  09/02/2018 FINDINGS: RIGHT jugular dual-lumen dialysis catheter with tip projecting over SVC. Normal heart size, mediastinal contours, and pulmonary vascularity. Atherosclerotic calcification aorta. RIGHT upper lobe scarring and volume loss. Infiltrates are identified at the mid to lower LEFT lung and at the base of the RIGHT lung, could represent asymmetric pulmonary edema or infection. Tiny BILATERAL pleural effusions blunt the posterior costophrenic angles. No pneumothorax. Bones demineralized. Vascular stents LEFT upper extremity. IMPRESSION: Asymmetric pulmonary infiltrates LEFT greater than RIGHT question asymmetric pulmonary edema versus multifocal infection. RIGHT upper lobe scarring and volume loss. Aortic Atherosclerosis (ICD10-I70.0). Electronically Signed   By: Lavonia Dana M.D.   On: 03/09/2019 09:18   Assessment & Plan by Problem:  Ms. Hanback is a 74 yo  F w/ a PMHx significant for ESRD on HD on MWF, HTN, diet controlled DM, a hx of hypoxia and dyspnea on exertion of unclear etiology on 3L O2 at home, anemia of chronic renal failure and chronic depression who presents with 10 days of shortness of breath, diarrhea, fatigue and anorexia found to have an elevated WBC and a chest x-ray concerning for multifocal pneumonia.  Active Problems:   HCAP (healthcare-associated pneumonia) -Ms. Wiesman is a 74 yo F w/ a PMHx significant for ESRD on HD on MWF, a hx of hypoxia and dyspnea on exertion of unclear etiology on 3L O2 at home (worked up for HF but echo with EV of 60-65% without evidence of high output failure), distant but currently inactive pulmonary sarcoidosis and anemia of chronic renal failure who presents with 10 days of shortness of breath, fatigue and anorexia found to have an elevated WBC and a chest x-ray concerning for multifocal pneumonia. -pt was visibly SOB at dialysis this AM and so she was sent to the hospital -pt afebrile on admission but reports subjective fevers at home; denies sick contacts -pt with increased respiratory effort on exam with diffuse rhonci and some mild wheezing but without an increased oxygen requirement -elevated WBC up to 25 with xray concerning for multifocal pneumonia -unlikely a HF exacerbation as patient without clear hx of heart failure and her picture is more consistent with being volume down given diarrhea and inability to draw off fluid at HD -SOB could be symptomatic anemia but her anemia is chronic and stable -given white count this is likely bacterial; pt's initial rapid covid was negative but will follow up with PCR -given patient has diarrhea this could be an atypical pneumonia such as legionnaires  -pt does have dialysis catheter in right jugular which could be a nidus for infection -given patient on dialysis she is at risk for resistant bacteria   Plan: -broad spectrum antibiotics with azithromycin  and cefepime; given pt had negative MRSA screen in June we will not give Vanc but will obtain another MRSA screen and if positive will start vanc -fu MRSA screen -obtain procal for antibiotic discontinuation purposes -daily CBC -monitor fever curve -continue home O2 -fu COVID test -  fu legionnaires test  Diarrhea: -pt reports hx of 10 days of watery diarrhea, around 8-10 episodes/day -today she is not having diarrhea likely secondary to her dehydration -she denies associated abdominal pain, nausea or vomiting, however, she does report some episodes of fecal incontinence during this time -pt denies recent antibiotic use -on exam she has no abdominal tenderness -CBC shows WBC of 25 -given concomitant pneumonia, legionnaires test pending -C diff and GIP ordered in the ED  Plan: -fu Cdiff and GIP -500 ml fluid bolus -strict I/Os -daily CBC -monitor fever curve -fu legionnaires -continue antibiotics as abvove  ESRD: -pt with ESRD on HD MWF  -labs do not suggest an urgent need for dialysis -pt not volume overloaded, rather pt likely dehydrated secondary to diarrhea  Plan: -nephrology on board  HTN: -continue home amlodipine 5 mg daily, labetalol 200 mg BID -holding lasix 40 mg due to dehydration  DM: -diet controlled DM -SSI while admitted  Diet: Renal VTE ppx: SubQ heparin CODE STATUS: Full Code  Dispo: Admit patient to Inpatient with expected length of stay greater than 2 midnights.  Signed: Al Decant, MD 03/09/2019, 2:15 PM

## 2019-03-09 NOTE — ED Notes (Signed)
ED Provider at bedside. 

## 2019-03-09 NOTE — ED Notes (Signed)
Lab called this RN that pt's urine sample was not sufficient for Legionella add-on. Pt will need another urine sample

## 2019-03-09 NOTE — ED Triage Notes (Signed)
To ED for eval of sob and weakness. States she went to dialysis this morning but they told her they could tell something was wrong. Told her they were calling the ambulance but pt brought self due to lack of funds -per pt. Speaking in full complete sentences. States she hasn't been around anyone with covid. Pt states she has had productive cough for past week. Seen in ED last week for same

## 2019-03-09 NOTE — Consult Note (Addendum)
[facility] Renal Consultation Note    Indication for Consultation:  Management of ESRD/hemodialysis; anemia, hypertension/volume and secondary hyperparathyroidism PCP:  HPI: Bianca Myers is a 74 y.o. female with ESRD on hemodialysis MWF at [facility]. PMH: HTN, DMT2, gout, PNA, DJD, Arthritis, SHPT, AOCD. Patient is a Je[employer] Witness-NO [employer]. She is compliant with HD prescription, does not miss treatments. Last HD 03/07/2019, ran full treatment, left 0.4 kg under OP EDW.   Patient presented to HD unit today and was noted by staff to be in acute respiratory distress with tachycardia, tachypnea, afebrile, weakness, loss of appetite, appeared slightly confused and was unable to give complete history of present illness. Apparently she has not been feeling well or eating for about two week, has been having diarrhea. Patient was seen in ED for similar complaints 02/28/2019 and sent home without intervention.   Currently, she is awake, alert, oriented X 3. She appears at baseline. She says repeats basically the same symptoms as noted above but added subjective fevers, chills and body aches. She has ongoing issues with pain in R. Shoulder from rotator cuff that needs repair. Currently she is on O2, denies SOB at rest. BMET unremarkable for dialysis patient, WBC 25.5, Lactic acid 1.1 Procalcitonin 12.74 BS 162. CXR with asymmetric pulmonary infiltrates L > R suggesting infection vs pulmonary edema. She has BLE edema, tachynpnea with scattered rhonchi throughout lung fields, has lost body weight. Concerned about pulmonary edema superimposed on HCAP. Will have urgent HD for volume removal now. Stopped IVF bolus ordered for sepsis protocol-BP is elevated, ESRD does not need extra volume.    Past Medical History:  Diagnosis Date  . Anemia   . Arthritis   . Chronic kidney disease (CKD) stage G5/A1, glomerular filtration rate (GFR) less than or equal to 15 mL/min/1.73  square meter and albuminuria creatinine ratio less than 30 mg/g (HCC)   . Depression   . Diabetes mellitus ([place])   . Gout   . History of anemia due to chronic kidney disease   . History of degenerative disc disease   . Hypertension   . Pneumonia   . Secondary hyperparathyroidism ([place])   . Spondylisthesis    Past Surgical History:  Procedure Laterality Date  . A/V FISTULAGRAM Left 12/16/2018   Procedure: A/V FISTULAGRAM;  Surgeon: Marty Heck, MD;  Location: [place] [facility];  Service: Cardiovascular;  Laterality: Left;  . AV FISTULA PLACEMENT Left 08/24/2018   Procedure: ARTERIOVENOUS (AV) FISTULA CREATION;  Surgeon: Waynetta Sandy, MD;  Location: [place];  Service: Vascular;  Laterality: Left;  . BASCILIC VEIN TRANSPOSITION Left 11/04/2018   Procedure: BASCILIC VEIN TRANSPOSITION SECOND STAGE;  Surgeon: Waynetta Sandy, MD;  Location: [place];  Service: Vascular;  Laterality: Left;  . COLONOSCOPY    . HIP SURGERY    . IR FLUORO GUIDE CV LINE RIGHT  08/21/2018  . IR US GUIDE VASC ACCESS RIGHT  08/21/2018  . PERIPHERAL VASCULAR INTERVENTION Left 12/16/2018   Procedure: PERIPHERAL VASCULAR INTERVENTION;  Surgeon: Marty Heck, MD;  Location: [place] [facility];  Service: Cardiovascular;  Laterality: Left;  arm fistula   Family History  Family history unknown: Yes   Social History:  reports that she quit smoking about 48 years ago. She has never used smokeless tobacco. She reports previous alcohol use. She reports that she does not use drugs. No Known Allergies Prior to Admission medications   Medication Sig Start Date End Date Taking? Authorizing  Provider  acetaminophen (TYLENOL) 650 MG CR tablet Take 1,300 mg by mouth every 8 (eight) hours as needed for pain.    [provider]  amLODipine (NORVASC) 5 MG tablet Take 5 mg by mouth at bedtime.    [provider]  Ascorbic Acid (VITAMIN C) 1000 MG tablet Take 1,000 mg by mouth 2 (two)  times a day.     [provider]  atorvastatin (LIPITOR) 10 MG tablet Take 10 mg by mouth at bedtime. 05/14/18   [provider]  ferric citrate (AURYXIA) 1 GM 210 MG(Fe) tablet Take 420 mg by mouth 3 (three) times daily with meals.    [provider]  furosemide (LASIX) 40 MG tablet Take 1 tablet (40 mg total) by mouth every Tuesday, Thursday, Saturday, and Sunday. Patient taking differently: Take 80 mg by mouth every Tuesday, Thursday, Saturday, and Sunday.  09/04/18   Nita Sells, MD  HYDROcodone-acetaminophen (NORCO/VICODIN) 5-325 MG tablet Take 1 tablet by mouth every 4 (four) hours as needed for moderate pain. Patient not taking: Reported on 02/28/2019 12/21/18 12/21/19  Marty Heck, MD  labetalol (NORMODYNE) 200 MG tablet Take 200 mg by mouth 2 (two) times daily.    [provider]  lidocaine-prilocaine (EMLA) cream Apply 1 application topically as directed. Prior to Dialysis 02/11/19   [provider]  multivitamin (RENA-VIT) TABS tablet Take 1 tablet by mouth at bedtime. 08/27/18   Dhungel, Flonnie Overman, MD  Omega-3 Fatty Acids (FISH OIL) 1000 MG CAPS Take 1,000 mg by mouth 2 (two) times daily.     [provider]  polyethylene glycol (MIRALAX) 17 g packet Take 17 g by mouth daily as needed for moderate constipation. 08/27/18   Dhungel, Nishant, MD  Turmeric 500 MG CAPS Take 500 mg by mouth daily.     [provider]   Current Facility-Administered Medications  Medication Dose Route Frequency Provider Last Rate Last Admin  . acetaminophen (TYLENOL) tablet 650 mg  650 mg Oral Q6H PRN Ina Homes, MD       Or  . acetaminophen (TYLENOL) suppository 650 mg  650 mg Rectal Q6H PRN Ina Homes, MD      . amLODipine (NORVASC) tablet 5 mg  5 mg Oral QHS Helberg, Justin, MD      . atorvastatin (LIPITOR) tablet 10 mg  10 mg Oral QHS Helberg, Larkin Ina, MD      . Derrill Memo ON 03/10/2019] azithromycin (ZITHROMAX) 250 mg in  dextrose 5 % 125 mL IVPB  250 mg Intravenous Q24H Helberg, Larkin Ina, MD      . Derrill Memo ON 03/11/2019] ceFEPIme (MAXIPIME) 2 g in sodium chloride 0.9 % 100 mL IVPB  2 g Intravenous Q M,W,F-1800 Helberg, Larkin Ina, MD      . Derrill Memo ON 03/10/2019] Chlorhexidine Gluconate Cloth 2 % PADS 6 each  6 each Topical Q0600 Valentina Gu, NP      . ferric citrate (AURYXIA) tablet 420 mg  420 mg Oral TID WC Helberg, Justin, MD      . heparin injection 5,000 Units  5,000 Units Subcutaneous Q8H Helberg, Justin, MD      . insulin aspart (novoLOG) injection 0-9 Units  0-9 Units Subcutaneous TID WC Helberg, Justin, MD      . labetalol (NORMODYNE) tablet 200 mg  200 mg Oral BID Helberg, Justin, MD      . lidocaine-prilocaine (EMLA) cream 1 application  1 application Topical UD Ina Homes, MD      . ondansetron [facility]) tablet  4 mg  4 mg Oral Q6H PRN Ina Homes, MD       Or  . ondansetron (ZOFRAN) injection 4 mg  4 mg Intravenous Q6H PRN Helberg, Justin, MD      . polyethylene glycol (MIRALAX / GLYCOLAX) packet 17 g  17 g Oral Daily PRN Ina Homes, MD       Current Outpatient Medications  Medication Sig Dispense Refill  . acetaminophen (TYLENOL) 650 MG CR tablet Take 1,300 mg by mouth every 8 (eight) hours as needed for pain.    Marland Kitchen amLODipine (NORVASC) 5 MG tablet Take 5 mg by mouth at bedtime.    . Ascorbic Acid (VITAMIN C) 1000 MG tablet Take 1,000 mg by mouth 2 (two) times a day.     Marland Kitchen atorvastatin (LIPITOR) 10 MG tablet Take 10 mg by mouth at bedtime.    . ferric citrate (AURYXIA) 1 GM 210 MG(Fe) tablet Take 420 mg by mouth 3 (three) times daily with meals.    . furosemide (LASIX) 40 MG tablet Take 1 tablet (40 mg total) by mouth every Tuesday, Thursday, Saturday, and Sunday. (Patient taking differently: Take 80 mg by mouth every Tuesday, Thursday, Saturday, and Sunday. ) 30 tablet 0  . HYDROcodone-acetaminophen (NORCO/VICODIN) 5-325 MG tablet Take 1 tablet by mouth every 4 (four) hours as needed for  moderate pain. (Patient not taking: Reported on 02/28/2019) 20 tablet 0  . labetalol (NORMODYNE) 200 MG tablet Take 200 mg by mouth 2 (two) times daily.    Marland Kitchen lidocaine-prilocaine (EMLA) cream Apply 1 application topically as directed. Prior to Dialysis    . multivitamin (RENA-VIT) TABS tablet Take 1 tablet by mouth at bedtime. 30 tablet 0  . Omega-3 Fatty Acids (FISH OIL) 1000 MG CAPS Take 1,000 mg by mouth 2 (two) times daily.     . polyethylene glycol (MIRALAX) 17 g packet Take 17 g by mouth daily as needed for moderate constipation. 14 each 0  . Turmeric 500 MG CAPS Take 500 mg by mouth daily.      Labs: Basic Metabolic Panel: Recent Labs  Lab 03/09/19 0902 03/09/19 1037  NA 133* 136  K 4.0 4.2  CL 96* 96*  CO2  --  25  GLUCOSE 175* 162*  BUN 40* 49*  CREATININE 8.50* 8.53*  CALCIUM  --  8.8*   Liver Function Tests: Recent Labs  Lab 03/09/19 1037  AST 15  ALT 19  ALKPHOS 75  BILITOT 0.4  PROT 7.2  ALBUMIN 2.1*   Recent Labs  Lab 03/09/19 1037  LIPASE 15   No results for input(s): AMMONIA in the last 168 hours. CBC: Recent Labs  Lab 03/09/19 0848 03/09/19 0902  WBC 25.5*  --   HGB 9.5* 10.9*  HCT 28.5* 32.0*  MCV 89.9  --   PLT 240  --    Cardiac Enzymes: No results for input(s): CKTOTAL, CKMB, CKMBINDEX, TROPONINI in the last 168 hours. CBG: No results for input(s): GLUCAP in the last 168 hours. Iron Studies: No results for input(s): IRON, TIBC, TRANSFERRIN, FERRITIN in the last 72 hours. Studies/Results: DG Chest 2 View  Result Date: 03/09/2019 CLINICAL DATA:  Shortness of breath and weakness, went to dialysis this morning, history end-stage renal disease, diabetes mellitus, hypertension, secondary hyperparathyroidism, former smoker EXAM: CHEST - 2 VIEW COMPARISON:  09/02/2018 FINDINGS: RIGHT jugular dual-lumen dialysis catheter with tip projecting over SVC. Normal heart size, mediastinal contours, and pulmonary vascularity. Atherosclerotic calcification  aorta. RIGHT upper lobe scarring and volume  loss. Infiltrates are identified at the mid to lower LEFT lung and at the base of the RIGHT lung, could represent asymmetric pulmonary edema or infection. Tiny BILATERAL pleural effusions blunt the posterior costophrenic angles. No pneumothorax. Bones demineralized. Vascular stents LEFT upper extremity. IMPRESSION: Asymmetric pulmonary infiltrates LEFT greater than RIGHT question asymmetric pulmonary edema versus multifocal infection. RIGHT upper lobe scarring and volume loss. Aortic Atherosclerosis (ICD10-I70.0). Electronically Signed   By: Lavonia Dana M.D.   On: 03/09/2019 09:18    ROS: As per HPI otherwise negative.   Physical Exam: Vitals:   03/09/19 1200 03/09/19 1300 03/09/19 1331 03/09/19 1400  BP: 140/74 (!) 156/68 (!) 156/77 (!) 156/78  Pulse: 69 69 65 69  Resp:   (!) 35 (!) 31  Temp:      TempSrc:      SpO2: 96% 100% 100% 100%     General: Pleasant elderly female in NAD Head: Normocephalic, atraumatic, sclera non-icteric, mucus membranes are moist Neck: Supple. JVD slightly elevated at 30 degrees.  Lungs: Bilateral breath sounds with scattered rhonchi upper airway which clear with cough. No wheezing at present. O2 @ 2L/M with mild tachypnea, no WOB.  Heart: RRR with S1 S2. No murmurs, rubs, or gallops appreciated. Abdomen: Soft, non-tender, non-distended with normoactive bowel sounds. No rebound/guarding. No obvious abdominal masses. M-S:  Strength and tone appear normal for age. Lower extremities:  Trace LLE edema no RLE edema. Neuro: Alert and oriented X 3. Moves all extremities spontaneously. Psych:  Responds to questions appropriately with a normal affect. Dialysis Access: [facility] R chest site is CDI.   Dialysis Orders: Center: [place] MWF 4 hrs 180NRe 400/800 79 kg 2.0 K/2.25 Ca UFP 4 TDC -No heparin -No ESA. PATIENT IS A JEHOVAH WITNESS-DO NOT TRANSFUSE -No VDRA Auryxia 210 mg 2 tabs PO TID AC  Assessment/Plan: 1.   HCAP-leukocytosis, asymmetrical infiltrates on chest xray. BCs drawn. Started on Azithromycin and cefepime per primary. COVID 19 negative. Patient has RIJ [place]. If BC positive will have to remove [facility] for cath holiday and replace. Monitor.  2.  Volume overload-patient has not been eating, has lost wt, left under OP EDW last tx. Challenge and lower volume as tolerated in HD today 3.  Diarrhea-on enteric precautions. C  4.  ESRD -  MWF [place]. HD today. No heparin. K+ 4.2 Use 2.0 K bath 5.  Hypertension/volume  -As noted above. Resumed amlodipine, labetalol. Takes furosemide 80 mg PO on non HD days. Hold for now.   6.  Anemia  - HGB 9.5. today. Give Aranesp 25 mcg IV with HD today. DO NOT TRANSFUSE.  7.  Metabolic bone disease -  Ca 8.8 C Ca 10.2. No VDRA. Continue Auryxia binders.  8.  Nutrition - Renal/Carb mod diet. Albumin low. Add prostat, renal vits 9.  DM-per primary  Jimmye Norman. Owens Shark, NP-C 03/09/2019, 3:27 PM  [facility] Beeper [phone]  Nephrology attending: Patient was seen and examined in ER.  Chart reviewed.  I agree with assessment and plan as outlined above. 74 year old female with history of hypertension, diabetes, ESRD on HD MWF at [facility] kidney center admitted with pneumonia.  Currently on 2 L of oxygen.  Plan for HD today if schedule allows.  Continue current medication.  Lawson Radar, MD [facility].

## 2019-03-09 NOTE — ED Provider Notes (Signed)
[facility] EMERGENCY DEPARTMENT Provider Note   CSN: [id number] Arrival date & time: 03/09/19  0831     History Chief Complaint  Patient presents with  . Shortness of Breath    Bianca Myers is a 74 y.o. female presenting for evaluation of sob and weakness.   Pt states for the past week she has had gradually worsening sob and weakness. She reports associated diarrhea. She was seen in the ED on 12/27, no acute abnormalities were seen. She reports subjective fevers. She states normally she walks on her own, now has difficulty standing due to weakness. She lives at home with her son. She denies sore throat, cp, n/v, abd pain. She produces some urine, but denies abnormality. 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 She went today, but did not get dialysis due to weakness. pt states she has been dry recently (they have not been able to pull fluid off).   Additional history obtained from chart review. H/o esrd on dialysis, anemia, depression, DM, htn, jehova's witness.   HPI     Past Medical History:  Diagnosis Date  . Anemia   . Arthritis   . Chronic kidney disease (CKD) stage G5/A1, glomerular filtration rate (GFR) less than or equal to 15 mL/min/1.73 square meter and albuminuria creatinine ratio less than 30 mg/g (HCC)   . Depression   . Diabetes mellitus ([place])   . Gout   . History of anemia due to chronic kidney disease   . History of degenerative disc disease   . Hypertension   . Pneumonia   . Secondary hyperparathyroidism ([place])   . Spondylisthesis     Patient Active Problem List   Diagnosis Date Noted  . Heart failure ([place]) 08/31/2018  . DOE (dyspnea on exertion) 08/30/2018  . Elevated brain natriuretic peptide (BNP) level 08/30/2018  . Anemia, chronic renal failure, stage 5 (HCC) 08/30/2018  . Anemia of chronic kidney failure, stage 5 ([place]) 08/27/2018  . Chronic depression 08/27/2018  . Patient is Jehovah's Witness 08/27/2018  . Essential hypertension    . CKD (chronic kidney disease) requiring chronic dialysis ([place])   . Anemia   . Syncope 08/20/2018  . Chronic kidney disease (CKD), stage IV (severe) ([place]) 12/10/2015    Past Surgical History:  Procedure Laterality Date  . A/V FISTULAGRAM Left 12/16/2018   Procedure: A/V FISTULAGRAM;  Surgeon: Marty Heck, MD;  Location: [facility];  Service: Cardiovascular;  Laterality: Left;  . AV FISTULA PLACEMENT Left 08/24/2018   Procedure: ARTERIOVENOUS (AV) FISTULA CREATION;  Surgeon: Waynetta Sandy, MD;  Location: [facility];  Service: Vascular;  Laterality: Left;  . BASCILIC VEIN TRANSPOSITION Left 11/04/2018   Procedure: BASCILIC VEIN TRANSPOSITION SECOND STAGE;  Surgeon: Waynetta Sandy, MD;  Location: [facility];  Service: Vascular;  Laterality: Left;  . COLONOSCOPY    . HIP SURGERY    . IR FLUORO GUIDE CV LINE RIGHT  08/21/2018  . IR US GUIDE VASC ACCESS RIGHT  08/21/2018  . PERIPHERAL VASCULAR INTERVENTION Left 12/16/2018   Procedure: PERIPHERAL VASCULAR INTERVENTION;  Surgeon: Marty Heck, MD;  Location: [facility];  Service: Cardiovascular;  Laterality: Left;  arm fistula     OB History   No obstetric history on file.     Family History  Family history unknown: Yes    Social History   Tobacco Use  . Smoking status: Former Smoker    Quit date: 1973    Years since quitting: 48.0  .  Smokeless tobacco: Never Used  Substance Use Topics  . Alcohol use: Not Currently  . Drug use: Never    Home Medications Prior to Admission medications   Medication Sig Start Date End Date Taking? Authorizing Provider  acetaminophen (TYLENOL) 650 MG CR tablet Take 1,300 mg by mouth every 8 (eight) hours as needed for pain.    [provider]  amLODipine (NORVASC) 5 MG tablet Take 5 mg by mouth at bedtime.    [provider]  Ascorbic Acid (VITAMIN C) 1000 MG tablet Take 1,000 mg by mouth 2 (two) times a day.     [provider]   atorvastatin (LIPITOR) 10 MG tablet Take 10 mg by mouth at bedtime. 05/14/18   [provider]  ferric citrate (AURYXIA) 1 GM 210 MG(Fe) tablet Take 420 mg by mouth 3 (three) times daily with meals.    [provider]  furosemide (LASIX) 40 MG tablet Take 1 tablet (40 mg total) by mouth every Tuesday, Thursday, Saturday, and Sunday. Patient taking differently: Take 80 mg by mouth every Tuesday, Thursday, Saturday, and Sunday.  09/04/18   Nita Sells, MD  HYDROcodone-acetaminophen (NORCO/VICODIN) 5-325 MG tablet Take 1 tablet by mouth every 4 (four) hours as needed for moderate pain. Patient not taking: Reported on 02/28/2019 12/21/18 12/21/19  Marty Heck, MD  labetalol (NORMODYNE) 200 MG tablet Take 200 mg by mouth 2 (two) times daily.    [provider]  lidocaine-prilocaine (EMLA) cream Apply 1 application topically as directed. Prior to Dialysis 02/11/19   [provider]  multivitamin (RENA-VIT) TABS tablet Take 1 tablet by mouth at bedtime. 08/27/18   Dhungel, Flonnie Overman, MD  Omega-3 Fatty Acids (FISH OIL) 1000 MG CAPS Take 1,000 mg by mouth 2 (two) times daily.     [provider]  polyethylene glycol (MIRALAX) 17 g packet Take 17 g by mouth daily as needed for moderate constipation. 08/27/18   Dhungel, Nishant, MD  Turmeric 500 MG CAPS Take 500 mg by mouth daily.     [provider]    Allergies    Patient has no known allergies.  Review of Systems   Review of Systems  Respiratory: Positive for shortness of breath.   Gastrointestinal: Positive for diarrhea.  Neurological: Positive for weakness.  All other systems reviewed and are negative.   Physical Exam Updated Vital Signs BP (!) 161/83   Pulse 72   Temp 99.7 F (37.6 C) (Oral)   Resp (!) 24   SpO2 100%   Physical Exam Vitals and nursing note reviewed.  Constitutional:      General: She is not in acute distress.    Appearance: She is well-developed.      Comments: Appears nontoxic, warm to the touch  HENT:     Head: Normocephalic and atraumatic.     Mouth/Throat:     Mouth: Mucous membranes are dry.  Eyes:     Extraocular Movements: Extraocular movements intact.     Conjunctiva/sclera: Conjunctivae normal.     Pupils: Pupils are equal, round, and reactive to light.  Cardiovascular:     Rate and Rhythm: Normal rate and regular rhythm.     Pulses: Normal pulses.  Pulmonary:     Effort: Pulmonary effort is normal. No respiratory distress.     Breath sounds: No wheezing.     Comments: Coarse lung sounds. Speaking in short sentences. sats stable on room air Abdominal:     General: There is no distension.  Palpations: Abdomen is soft. There is no mass.     Tenderness: There is no abdominal tenderness. There is no guarding or rebound.  Musculoskeletal:        General: Normal range of motion.     Cervical back: Normal range of motion and neck supple.     Comments: No significant edema  Skin:    General: Skin is warm and dry.     Capillary Refill: Capillary refill takes less than 2 seconds.  Neurological:     Mental Status: She is alert and oriented to person, place, and time.     ED Results / Procedures / Treatments   Labs (all labs ordered are listed, but only abnormal results are displayed) Labs Reviewed  CBC - Abnormal; Notable for the following components:      Result Value   WBC 25.5 (*)    RBC 3.17 (*)    Hemoglobin 9.5 (*)    HCT 28.5 (*)    All other components within normal limits  URINALYSIS, ROUTINE W REFLEX MICROSCOPIC - Abnormal; Notable for the following components:   APPearance CLOUDY (*)    Hgb urine dipstick SMALL (*)    Protein, ur >300 (*)    Leukocytes,Ua SMALL (*)    All other components within normal limits  COMPREHENSIVE METABOLIC PANEL - Abnormal; Notable for the following components:   Chloride 96 (*)    Glucose, Bld 162 (*)    BUN 49 (*)    Creatinine, Ser 8.53 (*)    Calcium 8.8 (*)     Albumin 2.1 (*)    GFR calc non Af Amer 4 (*)    GFR calc Af Amer 5 (*)    All other components within normal limits  URINALYSIS, MICROSCOPIC (REFLEX) - Abnormal; Notable for the following components:   Bacteria, UA MANY (*)    Non Squamous Epithelial PRESENT (*)    All other components within normal limits  I-STAT CHEM 8, ED - Abnormal; Notable for the following components:   Sodium 133 (*)    Chloride 96 (*)    BUN 40 (*)    Creatinine, Ser 8.50 (*)    Glucose, Bld 175 (*)    Calcium, Ion 1.06 (*)    Hemoglobin 10.9 (*)    HCT 32.0 (*)    All other components within normal limits  C DIFFICILE QUICK SCREEN W PCR REFLEX  GI PATHOGEN PANEL BY PCR, STOOL  URINE CULTURE  CULTURE, BLOOD (ROUTINE X 2)  CULTURE, BLOOD (ROUTINE X 2)  LIPASE, BLOOD  LACTIC ACID, PLASMA  LACTIC ACID, PLASMA  POC SARS CORONAVIRUS 2 AG -  ED    EKG EKG Interpretation  Date/Time:  Wednesday March 09 2019 08:38:46 EST Ventricular Rate:  76 PR Interval:    QRS Duration: 100 QT Interval:  408 QTC Calculation: 459 R Axis:   56 Text Interpretation: Normal sinus rhythm  with one droped p wave Abnormal ECG flipped t waves in inferior leads resolved Otherwise no significant change Confirmed by Deno Etienne [phone]) on 03/09/2019 9:49:53 AM   Radiology DG Chest 2 View  Result Date: 03/09/2019 CLINICAL DATA:  Shortness of breath and weakness, went to dialysis this morning, history end-stage renal disease, diabetes mellitus, hypertension, secondary hyperparathyroidism, former smoker EXAM: CHEST - 2 VIEW COMPARISON:  09/02/2018 FINDINGS: RIGHT jugular dual-lumen dialysis catheter with tip projecting over SVC. Normal heart size, mediastinal contours, and pulmonary vascularity. Atherosclerotic calcification aorta. RIGHT upper lobe scarring and volume loss. Infiltrates  are identified at the mid to lower LEFT lung and at the base of the RIGHT lung, could represent asymmetric pulmonary edema or infection. Tiny  BILATERAL pleural effusions blunt the posterior costophrenic angles. No pneumothorax. Bones demineralized. Vascular stents LEFT upper extremity. IMPRESSION: Asymmetric pulmonary infiltrates LEFT greater than RIGHT question asymmetric pulmonary edema versus multifocal infection. RIGHT upper lobe scarring and volume loss. Aortic Atherosclerosis (ICD10-I70.0). Electronically Signed   By: Lavonia Dana M.D.   On: 03/09/2019 09:18    Procedures Procedures (including critical care time)  Medications Ordered in ED Medications  sodium chloride 0.9 % bolus 250 mL (250 mLs Intravenous New Bag/Given 03/09/19 1048)  cefTRIAXone (ROCEPHIN) 1 g in sodium chloride 0.9 % 100 mL IVPB (1 g Intravenous New Bag/Given 03/09/19 1049)  azithromycin (ZITHROMAX) 500 mg in sodium chloride 0.9 % 250 mL IVPB (500 mg Intravenous New Bag/Given 03/09/19 1051)  acetaminophen (TYLENOL) tablet 650 mg (650 mg Oral Given 03/09/19 1046)    ED Course  I have reviewed the triage vital signs and the nursing notes.  Pertinent labs & imaging results that were available during my care of the patient were reviewed by me and considered in my medical decision making (see chart for details).    MDM Rules/Calculators/A&P                      Pt presenting for evaluation of sob and weakness. Physical exam shows pt who appears slightly dehydrated, nontoxic. Coarse lung sounds. Labs obtained from triage show leukocytosis of 25. cxr viewed and interpreted by me, shows bilateral opacities. per radiology consider edema vs pneumonia. As she has a leukocytosis and worsening symptoms, and does not appear fluid overloaded, favor pna. Will start abx. Will order blood cultures and lactic. ua and stool cultures obtained to r/o other causes of leukocytosis.   UA negative. Stool pending, lactic normal. Will call for admission.   Discussed with IMTS, pt to be admitted.   Dicussed with Dr. Carolin Sicks from nephrology, pt to receive dialysis while admitted.    Final Clinical Impression(s) / ED Diagnoses Final diagnoses:  Pneumonia of both lungs due to infectious organism, unspecified part of lung    Rx / DC Orders ED Discharge Orders    None       Franchot Heidelberg, PA-C 03/09/19 [place], DO 03/09/19 1218

## 2019-03-10 ENCOUNTER — Inpatient Hospital Stay (HOSPITAL_COMMUNITY): Payer: Medicare HMO

## 2019-03-10 DIAGNOSIS — Z9981 Dependence on supplemental oxygen: Secondary | ICD-10-CM

## 2019-03-10 DIAGNOSIS — E119 Type 2 diabetes mellitus without complications: Secondary | ICD-10-CM

## 2019-03-10 DIAGNOSIS — N186 End stage renal disease: Secondary | ICD-10-CM

## 2019-03-10 DIAGNOSIS — F329 Major depressive disorder, single episode, unspecified: Secondary | ICD-10-CM

## 2019-03-10 DIAGNOSIS — Z862 Personal history of diseases of the blood and blood-forming organs and certain disorders involving the immune mechanism: Secondary | ICD-10-CM

## 2019-03-10 DIAGNOSIS — R7881 Bacteremia: Secondary | ICD-10-CM

## 2019-03-10 DIAGNOSIS — Z79899 Other long term (current) drug therapy: Secondary | ICD-10-CM

## 2019-03-10 DIAGNOSIS — Z992 Dependence on renal dialysis: Secondary | ICD-10-CM

## 2019-03-10 DIAGNOSIS — I350 Nonrheumatic aortic (valve) stenosis: Secondary | ICD-10-CM

## 2019-03-10 DIAGNOSIS — J189 Pneumonia, unspecified organism: Secondary | ICD-10-CM

## 2019-03-10 DIAGNOSIS — I120 Hypertensive chronic kidney disease with stage 5 chronic kidney disease or end stage renal disease: Secondary | ICD-10-CM

## 2019-03-10 DIAGNOSIS — D631 Anemia in chronic kidney disease: Secondary | ICD-10-CM

## 2019-03-10 DIAGNOSIS — E1122 Type 2 diabetes mellitus with diabetic chronic kidney disease: Secondary | ICD-10-CM

## 2019-03-10 DIAGNOSIS — B9561 Methicillin susceptible Staphylococcus aureus infection as the cause of diseases classified elsewhere: Secondary | ICD-10-CM | POA: Diagnosis present

## 2019-03-10 DIAGNOSIS — Z87891 Personal history of nicotine dependence: Secondary | ICD-10-CM

## 2019-03-10 DIAGNOSIS — M75101 Unspecified rotator cuff tear or rupture of right shoulder, not specified as traumatic: Secondary | ICD-10-CM

## 2019-03-10 DIAGNOSIS — R197 Diarrhea, unspecified: Secondary | ICD-10-CM

## 2019-03-10 DIAGNOSIS — G8929 Other chronic pain: Secondary | ICD-10-CM

## 2019-03-10 LAB — BLOOD CULTURE ID PANEL (REFLEXED)

## 2019-03-10 LAB — ECHOCARDIOGRAM COMPLETE
Height: 66.5 in
Weight: 2747.81 oz

## 2019-03-10 LAB — CBC
HCT: 25.9 % — ABNORMAL LOW (ref 36.0–46.0)
Hemoglobin: 8.8 g/dL — ABNORMAL LOW (ref 12.0–15.0)
MCH: 30.2 pg (ref 26.0–34.0)
MCHC: 34 g/dL (ref 30.0–36.0)
MCV: 89 fL (ref 80.0–100.0)
Platelets: 229 10*3/uL (ref 150–400)
RBC: 2.91 MIL/uL — ABNORMAL LOW (ref 3.87–5.11)
RDW: 15 % (ref 11.5–15.5)
WBC: 19.7 10*3/uL — ABNORMAL HIGH (ref 4.0–10.5)
nRBC: 0 % (ref 0.0–0.2)

## 2019-03-10 LAB — URINE CULTURE: Culture: NO GROWTH

## 2019-03-10 LAB — RENAL FUNCTION PANEL
Albumin: 1.9 g/dL — ABNORMAL LOW (ref 3.5–5.0)
Anion gap: 14 (ref 5–15)
BUN: 27 mg/dL — ABNORMAL HIGH (ref 8–23)
CO2: 29 mmol/L (ref 22–32)
Calcium: 8.3 mg/dL — ABNORMAL LOW (ref 8.9–10.3)
Chloride: 92 mmol/L — ABNORMAL LOW (ref 98–111)
Creatinine, Ser: 5.42 mg/dL — ABNORMAL HIGH (ref 0.44–1.00)
GFR calc Af Amer: 8 mL/min — ABNORMAL LOW (ref >60)
GFR calc non Af Amer: 7 mL/min — ABNORMAL LOW (ref >60)
Glucose, Bld: 121 mg/dL — ABNORMAL HIGH (ref 70–99)
Phosphorus: 5.1 mg/dL — ABNORMAL HIGH (ref 2.5–4.6)
Potassium: 4.1 mmol/L (ref 3.5–5.1)
Sodium: 135 mmol/L (ref 135–145)

## 2019-03-10 LAB — PROCALCITONIN: Procalcitonin: 12.68 ng/mL

## 2019-03-10 LAB — GLUCOSE, CAPILLARY
Glucose-Capillary: 129 mg/dL — ABNORMAL HIGH (ref 70–99)
Glucose-Capillary: 149 mg/dL — ABNORMAL HIGH (ref 70–99)
Glucose-Capillary: 120 mg/dL — ABNORMAL HIGH (ref 70–99)
Glucose-Capillary: 134 mg/dL — ABNORMAL HIGH (ref 70–99)

## 2019-03-10 MED ORDER — FUROSEMIDE 80 MG PO TABS
80.0000 mg | ORAL_TABLET | ORAL | Status: DC
  Administered 2019-03-10 – 2019-03-15 (×4): 80 mg via ORAL
  Filled 2019-03-10 (×4): qty 1

## 2019-03-10 MED ORDER — NEPRO/CARBSTEADY PO LIQD
237.0000 mL | Freq: Three times a day (TID) | ORAL | Status: DC
  Administered 2019-03-10 – 2019-03-13 (×5): 237 mL via ORAL

## 2019-03-10 MED ORDER — SODIUM CHLORIDE 0.9 % IV SOLN
INTRAVENOUS | Status: DC | PRN
  Administered 2019-03-10: 250 mL via INTRAVENOUS

## 2019-03-10 MED ORDER — LIDOCAINE 5 % EX PTCH
1.0000 | MEDICATED_PATCH | CUTANEOUS | Status: DC
  Administered 2019-03-10 – 2019-03-15 (×6): 1 via TRANSDERMAL
  Filled 2019-03-10 (×6): qty 1

## 2019-03-10 MED ORDER — CEFAZOLIN SODIUM-DEXTROSE 1-4 GM/50ML-% IV SOLN
1.0000 g | INTRAVENOUS | Status: DC
  Administered 2019-03-10 – 2019-03-14 (×5): 1 g via INTRAVENOUS
  Filled 2019-03-10 (×6): qty 50

## 2019-03-10 NOTE — Progress Notes (Signed)
[facility] Progress Note   Subjective: Currently getting ECHO. MSSA positive. Will need have TDC removed. Will have HD in AM and removed TDC post HD.    Objective Vitals:   03/09/19 1827 03/09/19 1922 03/09/19 2128 03/10/19 0545  BP: (!) 178/92  (!) 172/77 (!) 159/93  Pulse: 73  80 71  Resp: 20  19 20   Temp: 98.2 F (36.8 C)  98.4 F (36.9 C) 98.2 F (36.8 C)  TempSrc: Oral     SpO2: 100%  92% 100%  Weight:  77.9 kg    Height:  5' 6.5" (1.689 m)     Physical Exam General: Pleasant elderly female in NAD Heart: S1,S2 No M/R/G. SR rate 70s.  Lungs: CTAB A/P Abdomen: S, NT Extremities: No LE edema Dialysis Access: RIJ TDC drsg intact. L AVF + bruit   Additional Objective Labs: Basic Metabolic Panel: Recent Labs  Lab 03/09/19 0902 03/09/19 1037 03/10/19 0358  NA 133* 136 135  K 4.0 4.2 4.1  CL 96* 96* 92*  CO2  --  25 29  GLUCOSE 175* 162* 121*  BUN 40* 49* 27*  CREATININE 8.50* 8.53* 5.42*  CALCIUM  --  8.8* 8.3*  PHOS  --   --  5.1*   Liver Function Tests: Recent Labs  Lab 03/09/19 1037 03/10/19 0358  AST 15  --   ALT 19  --   ALKPHOS 75  --   BILITOT 0.4  --   PROT 7.2  --   ALBUMIN 2.1* 1.9*   Recent Labs  Lab 03/09/19 1037  LIPASE 15   CBC: Recent Labs  Lab 03/09/19 0848 03/09/19 0902 03/10/19 0358  WBC 25.5*  --  19.7*  HGB 9.5* 10.9* 8.8*  HCT 28.5* 32.0* 25.9*  MCV 89.9  --  89.0  PLT 240  --  229   Blood Culture    Component Value Date/Time   SDES BLOOD SITE NOT SPECIFIED 03/09/2019 1107   SDES BLOOD SITE NOT SPECIFIED 03/09/2019 1107   SPECREQUEST  03/09/2019 1107    BOTTLES DRAWN AEROBIC AND ANAEROBIC Blood Culture adequate volume   SPECREQUEST  03/09/2019 1107    BOTTLES DRAWN AEROBIC AND ANAEROBIC Blood Culture results may not be optimal due to an inadequate volume of blood received in culture bottles   CULT PENDING 03/09/2019 1107   CULT PENDING 03/09/2019 1107   REPTSTATUS PENDING 03/09/2019 1107    REPTSTATUS PENDING 03/09/2019 1107    Cardiac Enzymes: No results for input(s): CKTOTAL, CKMB, CKMBINDEX, TROPONINI in the last 168 hours. CBG: Recent Labs  Lab 03/09/19 2129 03/10/19 0728  GLUCAP 173* 129*   Iron Studies: No results for input(s): IRON, TIBC, TRANSFERRIN, FERRITIN in the last 72 hours. @lablastinr3 @ Studies/Results: DG Chest 2 View  Result Date: 03/09/2019 CLINICAL DATA:  Shortness of breath and weakness, went to dialysis this morning, history end-stage renal disease, diabetes mellitus, hypertension, secondary hyperparathyroidism, former smoker EXAM: CHEST - 2 VIEW COMPARISON:  09/02/2018 FINDINGS: RIGHT jugular dual-lumen dialysis catheter with tip projecting over SVC. Normal heart size, mediastinal contours, and pulmonary vascularity. Atherosclerotic calcification aorta. RIGHT upper lobe scarring and volume loss. Infiltrates are identified at the mid to lower LEFT lung and at the base of the RIGHT lung, could represent asymmetric pulmonary edema or infection. Tiny BILATERAL pleural effusions blunt the posterior costophrenic angles. No pneumothorax. Bones demineralized. Vascular stents LEFT upper extremity. IMPRESSION: Asymmetric pulmonary infiltrates LEFT greater than RIGHT question asymmetric pulmonary edema versus multifocal infection. RIGHT upper  lobe scarring and volume loss. Aortic Atherosclerosis (ICD10-I70.0). Electronically Signed   By: Lavonia Dana M.D.   On: 03/09/2019 09:18   Medications: . sodium chloride    . sodium chloride    . sodium chloride 250 mL (03/10/19 0610)  .  ceFAZolin (ANCEF) IV 1 g (03/10/19 1062)   . amLODipine  5 mg Oral QHS  . atorvastatin  10 mg Oral QHS  . Chlorhexidine Gluconate Cloth  6 each Topical Q0600  . darbepoetin (ARANESP) injection - DIALYSIS  25 mcg Intravenous Q Wed-HD  . feeding supplement (PRO-STAT SUGAR FREE 64)  30 mL Oral BID  . ferric citrate  420 mg Oral TID WC  . [START ON 03/11/2019] heparin  3,200 Units  Intraperitoneal Q M,W,F-HD  . heparin  5,000 Units Subcutaneous Q8H  . insulin aspart  0-9 Units Subcutaneous TID WC  . labetalol  200 mg Oral BID  . lidocaine-prilocaine  1 application Topical UD  . mouth rinse  15 mL Mouth Rinse BID  . multivitamin  1 tablet Oral QHS     Dialysis Orders: Center: [facility] MWF 4 hrs 180NRe 400/800 79 kg 2.0 K/2.25 Ca UFP 4 TDC -No heparin -No ESA. PATIENT IS A JEHOVAH WITNESS-DO NOT TRANSFUSE -No VDRA Auryxia 210 mg 2 tabs PO TID AC  Assessment/Plan: 1.  HCAP-leukocytosis, asymmetrical infiltrates on chest xray. BCs drawn. Started on Azithromycin and cefepime per primary. COVID 19 negative.  2.  MSSA Bacteremia-Seen by ID. Started on Ancef. Arranging for [facility] removal post HD tomorrow in IR. Attempt to use AVF tomorrow with HD. Patient is willing to try. If AVF use successful, will be able to leave [facility] out, otherwise plan for reinsertion 03/14/19 per IR.  3.  Volume overload-patient has not been eating, has lost wt, left under OP EDW last tx. Challenged and  Attempted to lower EDW 01/06. Volume status appears improved.  4.  Diarrhea-on enteric precautions. C Diff panel ordered.  5.  ESRD -  MWF [place]. HD tomorrow. Use new cannulation protocol-Use 17g needles, BFR 250/DFR 500. No heparin. K+ 4.1 Use 2.0 K bath 6.  Hypertension/volume  -As noted above. BP still elevated today. Resumed amlodipine, labetalol. Takes furosemide 80 mg PO on non HD days.Will resume furosemide today. HD 01/06.  Net UF 2.5 liters post wt 77.9. Will need need lower EDW on DC. UF as tolerated tomorrow, continue to lower volume as needed.  7.  Anemia  - HGB 8.8. today. Given Aranesp 25 mcg IV with HD 03/09/19. DO NOT TRANSFUSE.  8.  Metabolic bone disease -  Ca 8.8 C Ca 10.2. No VDRA. Continue Auryxia binders.  9.  Nutrition - Renal/Carb mod diet. Albumin low. Add prostat, renal vits 10. DM-per primary   Bianca Myers. Militza Devery NP-C 03/10/2019, 10:24 AM  [employer] [phone]

## 2019-03-10 NOTE — Progress Notes (Signed)
Initial Nutrition Assessment  DOCUMENTATION CODES:   Not applicable  INTERVENTION:    Nepro Shake po TID, each supplement provides 425 kcal and 19 grams protein  Renal MVI daily   NUTRITION DIAGNOSIS:   Increased nutrient needs related to chronic illness(ESRD on HD) as evidenced by estimated needs.  GOAL:   Patient will meet greater than or equal to 90% of their needs  MONITOR:   PO intake, Supplement acceptance, Weight trends, I & O's, Labs  REASON FOR ASSESSMENT:   Malnutrition Screening Tool    ASSESSMENT:   Patient with PMH significant for ESRD on HD, HTN, and DM. Presents this admission with HCAP and diarrhea.   RD working remotely.  Unable to reach pt by phone. Pt with watery diarrhea for >1 week and decreased PO intake. Will attempt to obtain nutrition/weight history if possible. Per H&P, pt was 0.4 kg under EDW after her last HD treatment on 1/4. Suspect pt has lost dry weight. RD to provide supplementation to maximize kcal and protein this admission. Last meal completion charted as 75%.   EDW:  79 kg  Current weight: 77.9 kg   I/O: -1,450 ml since admit Last HD yesterday: 2500 ml net UF   Medications: aranesp, 80 mg lasix daily, SS novolog, rena-vit Labs: Phosphorus 5.1 (wdl for HD) correct calcium 10 (wdl) CBG 129-173  Diet Order:   Diet Order            Diet renal/carb modified with fluid restriction Diet-HS Snack? Nothing; Fluid restriction: 1200 mL Fluid; Room service appropriate? Yes; Fluid consistency: Thin  Diet effective now              EDUCATION NEEDS:   Not appropriate for education at this time  Skin:  Skin Assessment: Reviewed RN Assessment  Last BM:  1/6  Height:   Ht Readings from Last 1 Encounters:  03/09/19 5' 6.5" (1.689 m)    Weight:   Wt Readings from Last 1 Encounters:  03/09/19 77.9 kg    Ideal Body Weight:  61.4 kg  BMI:  Body mass index is 27.3 kg/m.  Estimated Nutritional Needs:   Kcal:  2000-2200  kcal  Protein:  100-120 grams  Fluid:  1000 ml + UOP   Mariana Single RD, LDN Clinical Nutrition Pager # [phone]

## 2019-03-10 NOTE — Progress Notes (Addendum)
Subjective: Ms. Bianca Myers continues to feel poorly today, just very weak and tired. She does not report improvement in symptoms since dialysis. We discussed that we were treating her with a strong IV antibiotic as she has an infection in her blood that is likely contributing to most of her symptoms. She also reports some chronic R shoulder pain where she has a rotator cuff injury for which she needs surgery.  Consults: nephrology, ID  Objective:  Vital signs in last 24 hours: Vitals:   03/09/19 1827 03/09/19 1922 03/09/19 2128 03/10/19 0545  BP: (!) 178/92  (!) 172/77 (!) 159/93  Pulse: 73  80 71  Resp: 20  19 20   Temp: 98.2 F (36.8 C)  98.4 F (36.9 C) 98.2 F (36.8 C)  TempSrc: Oral     SpO2: 100%  92% 100%  Weight:  77.9 kg    Height:  5' 6.5" (1.689 m)     Physical Exam  Constitutional: She is well-developed, well-nourished, and in no distress. No distress.  Cardiovascular: Normal rate, regular rhythm and normal heart sounds.  No murmur heard. Pulmonary/Chest: Effort normal. No respiratory distress.  Bibasilar crackles  Abdominal: Soft. Bowel sounds are normal. She exhibits no distension.  Musculoskeletal:        General: Tenderness (R shoulder) present.     Cervical back: Normal range of motion.  Neurological: She is alert.  Skin: Skin is warm and dry. She is not diaphoretic.  Psychiatric: Affect normal.  Nursing note and vitals reviewed.   I/Os:  Intake/Output Summary (Last 24 hours) at 03/10/2019 1108 Last data filed at 03/10/2019 3474 Gross per 24 hour  Intake 1050.23 ml  Output 2500 ml  Net -1449.77 ml   Labs: Results for orders placed or performed during the hospital encounter of 03/09/19 (from the past 24 hour(s))  SARS CORONAVIRUS 2 (TAT 6-24 HRS)     Status: None   Collection Time: 03/09/19 12:30 PM  Result Value Ref Range   SARS Coronavirus 2 NEGATIVE NEGATIVE  MRSA PCR Screening     Status: None   Collection Time: 03/09/19  2:50 PM   Specimen:  STOOL; Nasopharyngeal  Result Value Ref Range   MRSA by PCR NEGATIVE NEGATIVE  Lactic acid, plasma     Status: None   Collection Time: 03/09/19  2:53 PM  Result Value Ref Range   Lactic Acid, Venous 1.2 0.5 - 1.9 mmol/L  Glucose, capillary     Status: Abnormal   Collection Time: 03/09/19  9:29 PM  Result Value Ref Range   Glucose-Capillary 173 (H) 70 - 99 mg/dL  CBC     Status: Abnormal   Collection Time: 03/10/19  3:58 AM  Result Value Ref Range   WBC 19.7 (H) 4.0 - 10.5 K/uL   RBC 2.91 (L) 3.87 - 5.11 MIL/uL   Hemoglobin 8.8 (L) 12.0 - 15.0 g/dL   HCT 25.9 (L) 36.0 - 46.0 %   MCV 89.0 80.0 - 100.0 fL   MCH 30.2 26.0 - 34.0 pg   MCHC 34.0 30.0 - 36.0 g/dL   RDW 15.0 11.5 - 15.5 %   Platelets 229 150 - 400 K/uL   nRBC 0.0 0.0 - 0.2 %  Renal function panel     Status: Abnormal   Collection Time: 03/10/19  3:58 AM  Result Value Ref Range   Sodium 135 135 - 145 mmol/L   Potassium 4.1 3.5 - 5.1 mmol/L   Chloride 92 (L) 98 - 111 mmol/L  CO2 29 22 - 32 mmol/L   Glucose, Bld 121 (H) 70 - 99 mg/dL   BUN 27 (H) 8 - 23 mg/dL   Creatinine, Ser 5.42 (H) 0.44 - 1.00 mg/dL   Calcium 8.3 (L) 8.9 - 10.3 mg/dL   Phosphorus 5.1 (H) 2.5 - 4.6 mg/dL   Albumin 1.9 (L) 3.5 - 5.0 g/dL   GFR calc non Af Amer 7 (L) >60 mL/min   GFR calc Af Amer 8 (L) >60 mL/min   Anion gap 14 5 - 15  Procalcitonin     Status: None   Collection Time: 03/10/19  3:58 AM  Result Value Ref Range   Procalcitonin 12.68 ng/mL  Glucose, capillary     Status: Abnormal   Collection Time: 03/10/19  7:28 AM  Result Value Ref Range   Glucose-Capillary 129 (H) 70 - 99 mg/dL    Imaging: No results found. Assessment/Plan:  Assessment & Plan by Problem:  Ms. Bianca Myers is a 74 yo F w/ a PMHx significant for ESRD on HD on MWF, HTN, diet controlled DM, a hx of hypoxia and dyspnea on exertion of unclear etiology on 3L O2 at home, anemia of chronic renal failure and chronic depression who presents with 10 days of shortness  of breath, diarrhea, fatigue and anorexia found to have an elevated WBC and a chest x-ray concerning for multifocal pneumonia found to have MSSA bacteremia.  Active Problems:   HCAP (healthcare-associated pneumonia)   MSSA Bacteremia -Ms. Bianca Myers is a 74 yo F w/ a PMHx significant for ESRD on HD on MWF, a hx of hypoxia and dyspnea on exertion of unclear etiology on 3L O2 at home (worked up for HF and 06/20 echo with EV of 60-65% without evidence of high output failure), distant but currently inactive pulmonary sarcoidosis and anemia of chronic renal failure who presents with 10 days of shortness of breath, fatigue and anorexia found to have an elevated WBC, a chest x-ray concerning for multifocal pneumonia and MSSA bacteremia for which she is on ancef  -pt also has diarrhea for which we're testing for legionnaires  -ID consulted -ECHO today -WBC down to 19.7 today -second COVID negative -pt has tunneled dialysis catheter which could be nidus for infection; nephrology plans to attempt use of AVF tomorrow and if successful will pull catheter   Plan: -fu ID recs -fu ECHO -continue ancef -daily CBC -monitor fever curve -continue home O2, attempt to wean -fu legionnaires test  Diarrhea: -pt reports hx of 10 days of watery diarrhea, around 8-10 episodes/day -she denies associated abdominal pain, nausea or vomiting, however, she does report some episodes of fecal incontinence during this time -pt denies recent antibiotic use -on exam she has no abdominal tenderness -CBC showed WBC of 25 on admission -given concomitant pneumonia, legionnaires test pending -GIP ordered in the ED -WBC down to 19.7 today -no diarrhea since admission  Plan: -fu GIP -strict I/Os -daily CBC -monitor fever curve -fu legionnaires -continue antibiotics as above  ESRD: -pt with ESRD on HD MWF  -nephrology believed patient to have excess volume thus they scheduled her for dialysis yesterday and pulled  2.5 L  Plan: -fu nephrology recs  R Shoulder Pain: -pt with reported hx of a R rotator cuff tear complaining of pain since admission unrelieved by tylenol  Plan: -will try lidocaine patch applied to area  HTN: -continue home amlodipine 5 mg daily, labetalol 200 mg BID -restart home lasix per nephro  DM: -diet controlled DM -SSI while  admitted  Dispo: Anticipated discharge pending clinical course.  Al Decant, MD 03/10/2019, 11:08 AM Pager: [phone]

## 2019-03-10 NOTE — Progress Notes (Signed)
PHARMACY - PHYSICIAN COMMUNICATION CRITICAL VALUE ALERT - BLOOD CULTURE IDENTIFICATION (BCID)  Bianca Myers is an 74 y.o. female who presented to [facility] on 03/09/2019 with a chief complaint of SOB and weakness  Assessment:  Started on azithro and cefepime for suspected PNA, now w/ 4/4 blood cx bottles growing MSSA.  Name of physician (or Provider) Contacted: Dr Tarry Kos  Current antibiotics: Azithro and cefepime  Changes to prescribed antibiotics recommended:  Recommendations accepted by provider  Results for orders placed or performed during the hospital encounter of 03/09/19  Blood Culture ID Panel (Reflexed) (Collected: 03/09/2019 11:07 AM)  Result Value Ref Range   Enterococcus species NOT DETECTED NOT DETECTED   Listeria monocytogenes NOT DETECTED NOT DETECTED   Staphylococcus species DETECTED (A) NOT DETECTED   Staphylococcus aureus (BCID) DETECTED (A) NOT DETECTED   Methicillin resistance NOT DETECTED NOT DETECTED   Streptococcus species NOT DETECTED NOT DETECTED   Streptococcus agalactiae NOT DETECTED NOT DETECTED   Streptococcus pneumoniae NOT DETECTED NOT DETECTED   Streptococcus pyogenes NOT DETECTED NOT DETECTED   Acinetobacter baumannii NOT DETECTED NOT DETECTED   Enterobacteriaceae species NOT DETECTED NOT DETECTED   Enterobacter cloacae complex NOT DETECTED NOT DETECTED   Escherichia coli NOT DETECTED NOT DETECTED   Klebsiella oxytoca NOT DETECTED NOT DETECTED   Klebsiella pneumoniae NOT DETECTED NOT DETECTED   Proteus species NOT DETECTED NOT DETECTED   Serratia marcescens NOT DETECTED NOT DETECTED   Haemophilus influenzae NOT DETECTED NOT DETECTED   Neisseria meningitidis NOT DETECTED NOT DETECTED   Pseudomonas aeruginosa NOT DETECTED NOT DETECTED   Candida albicans NOT DETECTED NOT DETECTED   Candida glabrata NOT DETECTED NOT DETECTED   Candida krusei NOT DETECTED NOT DETECTED   Candida parapsilosis NOT DETECTED NOT DETECTED   Candida tropicalis NOT  DETECTED NOT DETECTED     Pharmacy Antibiotic Note  Bianca Myers is a 75 y.o. female admitted on 03/09/2019 with PNA, now w/ blood cx revealing bacteremia with 4/4 bottles growing MSSA.  Pharmacy has been consulted for Ancef dosing.  Plan: D/C cefepime and azithro; start Ancef 1g IV Q24H.  Height: 5' 6.5" (168.9 cm) Weight: 171 lb 11.8 oz (77.9 kg) IBW/kg (Calculated) : 60.45  Temp (24hrs), Avg:98.8 F (37.1 C), Min:98.2 F (36.8 C), Max:99.7 F (37.6 C)  Recent Labs  Lab 03/09/19 0848 03/09/19 0902 03/09/19 1037 03/09/19 1048 03/09/19 1453  WBC 25.5*  --   --   --   --   CREATININE  --  8.50* 8.53*  --   --   LATICACIDVEN  --   --   --  1.1 1.2    Estimated Creatinine Clearance: 6.3 mL/min (A) (by C-G formula based on SCr of 8.53 mg/dL (H)).    No Known Allergies  Thank you for allowing pharmacy to be a part of this patient's care.  Wynona Neat, PharmD, BCPS  03/10/2019 4:21 AM

## 2019-03-10 NOTE — Progress Notes (Signed)
Patient ID: Bianca Myers, female   DOB: 1945/06/10, 74 y.o.   MRN: [id number]         [facility]    Date of Admission:  03/09/2019           Day 1 cefazolin       Reason for Consult: Automatic consultation for MSSA bacteremia     Assessment: She has MSSA bacteremia probably complicated by multifocal pneumonia.  I agree with cefazolin.  She will need to have her hemodialysis catheter removed and (hopefully) replaced when repeat blood cultures are negative at 48 hours.  I will order a TTE.  Plan: 1. Continue cefazolin 2. Repeat blood cultures 3. TTE 4. Recommend removal of hemodialysis catheter  Principal Problem:   MSSA bacteremia Active Problems:   HCAP (healthcare-associated pneumonia)   End-stage renal disease on hemodialysis ([place])   Anemia of chronic kidney failure, stage 5 (HCC)   Chronic depression   Patient is Jehovah's Witness   Essential hypertension   Diabetes mellitus ([place])   History of sarcoidosis   Scheduled Meds:  amLODipine  5 mg Oral QHS   atorvastatin  10 mg Oral QHS   Chlorhexidine Gluconate Cloth  6 each Topical Q0600   darbepoetin (ARANESP) injection - DIALYSIS  25 mcg Intravenous Q Wed-HD   feeding supplement (PRO-STAT SUGAR FREE 64)  30 mL Oral BID   ferric citrate  420 mg Oral TID WC   [START ON 03/11/2019] heparin  3,200 Units Intraperitoneal Q M,W,F-HD   heparin  5,000 Units Subcutaneous Q8H   insulin aspart  0-9 Units Subcutaneous TID WC   labetalol  200 mg Oral BID   lidocaine-prilocaine  1 application Topical UD   mouth rinse  15 mL Mouth Rinse BID   multivitamin  1 tablet Oral QHS   Continuous Infusions:  sodium chloride     sodium chloride     sodium chloride 250 mL (03/10/19 0610)    ceFAZolin (ANCEF) IV 1 g (03/10/19 0611)   PRN Meds:.sodium chloride, sodium chloride, sodium chloride, acetaminophen **OR** acetaminophen, alteplase, heparin, lidocaine (PF), lidocaine-prilocaine,  ondansetron **OR** ondansetron (ZOFRAN) IV, pentafluoroprop-tetrafluoroeth, polyethylene glycol  HPI: Bianca Myers is a 74 y.o. female on hemodialysis for end-stage renal disease.  She has had a right IJ dialysis catheter for the past 6 months.  She has had more pain around the catheter recently.  Over the last several weeks she has developed subjective fever, chills, sweats, worsening shortness of breath and extreme fatigue.  She had transient diarrhea and was seen in the emergency department on 02/28/2019.  She was released to home.  She says that her dry weight has been decreasing and she has been losing weight.  She says that they have not been able to perform much fluid off during recent hemodialysis treatments.  She has not missed any treatments.  She was admitted yesterday.  She is afebrile but had bilateral patchy infiltrates, left rater than right.  She was started on empiric antibiotics for pneumonia and underwent emergent hemodialysis.  Both admission blood cultures have grown MSSA.  COVID-19 PCR was negative.   Review of Systems: Review of Systems  Constitutional: Positive for chills, diaphoresis, fever, malaise/fatigue and weight loss.  HENT: Negative for congestion and sore throat.   Respiratory: Positive for cough and shortness of breath. Negative for sputum production.   Cardiovascular: Negative for chest pain.  Gastrointestinal: Positive for diarrhea. Negative for abdominal pain, nausea and vomiting.  Genitourinary: Negative for dysuria.  Musculoskeletal: Positive for myalgias.  Skin: Negative for rash.  Neurological: Negative for headaches.    Past Medical History:  Diagnosis Date   Anemia    Arthritis    Chronic kidney disease (CKD) stage G5/A1, glomerular filtration rate (GFR) less than or equal to 15 mL/min/1.73 square meter and albuminuria creatinine ratio less than 30 mg/g (HCC)    Depression    Diabetes mellitus (HCC)    Gout    History of anemia due to  chronic kidney disease    History of degenerative disc disease    Hypertension    Pneumonia    Secondary hyperparathyroidism ([place])    Spondylisthesis     Social History   Tobacco Use   Smoking status: Former Smoker    Quit date: 1973    Years since quitting: 48.0   Smokeless tobacco: Never Used  Substance Use Topics   Alcohol use: Not Currently   Drug use: Never    Family History  Family history unknown: Yes   No Known Allergies  OBJECTIVE: Blood pressure (!) 159/93, pulse 71, temperature 98.2 F (36.8 C), resp. rate 20, height 5' 6.5" (1.689 m), weight 77.9 kg, SpO2 100 %.  Physical Exam Constitutional:      Comments: She is very pleasant, resting quietly in bed.  Eyes:     Conjunctiva/sclera: Conjunctivae normal.  Neck:   Cardiovascular:     Rate and Rhythm: Normal rate and regular rhythm.     Heart sounds: No murmur.     Comments: She has distant heart sounds. Pulmonary:     Effort: Pulmonary effort is normal.     Breath sounds: Normal breath sounds.  Abdominal:     Palpations: Abdomen is soft.     Tenderness: There is no abdominal tenderness.  Musculoskeletal:        General: No swelling or tenderness.  Skin:    Findings: No rash.     Comments: Left upper arm fistula with good thrill.  No evidence of infection.  Psychiatric:        Mood and Affect: Mood normal.     Lab Results Lab Results  Component Value Date   WBC 19.7 (H) 03/10/2019   HGB 8.8 (L) 03/10/2019   HCT 25.9 (L) 03/10/2019   MCV 89.0 03/10/2019   PLT 229 03/10/2019    Lab Results  Component Value Date   CREATININE 5.42 (H) 03/10/2019   BUN 27 (H) 03/10/2019   NA 135 03/10/2019   K 4.1 03/10/2019   CL 92 (L) 03/10/2019   CO2 29 03/10/2019    Lab Results  Component Value Date   ALT 19 03/09/2019   AST 15 03/09/2019   ALKPHOS 75 03/09/2019   BILITOT 0.4 03/09/2019     Microbiology: Recent Results (from the past 240 hour(s))  Urine culture     Status: None    Collection Time: 03/09/19 10:32 AM   Specimen: Urine, Catheterized  Result Value Ref Range Status   Specimen Description URINE, CATHETERIZED  Final   Special Requests NONE  Final   Culture   Final    NO GROWTH Performed at [facility], [address], [place], [place] [address]    Report Status 03/10/2019 FINAL  Final  Blood culture (routine x 2)     Status: None (Preliminary result)   Collection Time: 03/09/19 11:07 AM   Specimen: BLOOD  Result Value Ref Range Status   Specimen Description BLOOD SITE NOT SPECIFIED  Final  Special Requests   Final    BOTTLES DRAWN AEROBIC AND ANAEROBIC Blood Culture adequate volume   Culture  Setup Time   Final    IN BOTH AEROBIC AND ANAEROBIC BOTTLES GRAM POSITIVE COCCI CRITICAL VALUE NOTED.  VALUE IS CONSISTENT WITH PREVIOUSLY REPORTED AND CALLED VALUE. Performed at [facility], [place] [address] [place], [place] [address]    Culture PENDING  Incomplete   Report Status PENDING  Incomplete  Blood culture (routine x 2)     Status: None (Preliminary result)   Collection Time: 03/09/19 11:07 AM   Specimen: BLOOD  Result Value Ref Range Status   Specimen Description BLOOD SITE NOT SPECIFIED  Final   Special Requests   Final    BOTTLES DRAWN AEROBIC AND ANAEROBIC Blood Culture results may not be optimal due to an inadequate volume of blood received in culture bottles   Culture  Setup Time   Final    IN BOTH AEROBIC AND ANAEROBIC BOTTLES GRAM POSITIVE COCCI Organism ID to follow CRITICAL RESULT CALLED TO, READ BACK BY AND VERIFIED WITHAlona Bene PHARMD 03/10/19 0401 JDW Performed at [facility], [place] [address] [place],  [address]    Culture PENDING  Incomplete   Report Status PENDING  Incomplete  Blood Culture ID Panel (Reflexed)     Status: Abnormal   Collection Time: 03/09/19 11:07 AM  Result Value Ref Range Status   Enterococcus species NOT DETECTED NOT DETECTED Final   Listeria monocytogenes NOT DETECTED NOT DETECTED  Final   Staphylococcus species DETECTED (A) NOT DETECTED Final    Comment: CRITICAL RESULT CALLED TO, READ BACK BY AND VERIFIED WITH: V BRYK PHARMD 03/10/19 0401 JDW    Staphylococcus aureus (BCID) DETECTED (A) NOT DETECTED Final    Comment: Methicillin (oxacillin) susceptible Staphylococcus aureus (MSSA). Preferred therapy is anti staphylococcal beta lactam antibiotic (Cefazolin or Nafcillin), unless clinically contraindicated. CRITICAL RESULT CALLED TO, READ BACK BY AND VERIFIED WITH: V BRYK PHARMD 03/10/19 0401 JDW    Methicillin resistance NOT DETECTED NOT DETECTED Final   Streptococcus species NOT DETECTED NOT DETECTED Final   Streptococcus agalactiae NOT DETECTED NOT DETECTED Final   Streptococcus pneumoniae NOT DETECTED NOT DETECTED Final   Streptococcus pyogenes NOT DETECTED NOT DETECTED Final   Acinetobacter baumannii NOT DETECTED NOT DETECTED Final   Enterobacteriaceae species NOT DETECTED NOT DETECTED Final   Enterobacter cloacae complex NOT DETECTED NOT DETECTED Final   Escherichia coli NOT DETECTED NOT DETECTED Final   Klebsiella oxytoca NOT DETECTED NOT DETECTED Final   Klebsiella pneumoniae NOT DETECTED NOT DETECTED Final   Proteus species NOT DETECTED NOT DETECTED Final   Serratia marcescens NOT DETECTED NOT DETECTED Final   Haemophilus influenzae NOT DETECTED NOT DETECTED Final   Neisseria meningitidis NOT DETECTED NOT DETECTED Final   Pseudomonas aeruginosa NOT DETECTED NOT DETECTED Final   Candida albicans NOT DETECTED NOT DETECTED Final   Candida glabrata NOT DETECTED NOT DETECTED Final   Candida krusei NOT DETECTED NOT DETECTED Final   Candida parapsilosis NOT DETECTED NOT DETECTED Final   Candida tropicalis NOT DETECTED NOT DETECTED Final    Comment: Performed at [facility], [place] [address] [place], [place] [address]  SARS CORONAVIRUS 2 (TAT 6-24 HRS)     Status: None   Collection Time: 03/09/19 12:30 PM  Result Value Ref Range Status   SARS Coronavirus 2  NEGATIVE NEGATIVE Final    Comment: (NOTE) SARS-CoV-2 target nucleic acids are NOT DETECTED. The  SARS-CoV-2 RNA is generally detectable in upper and lower respiratory specimens during the acute phase of infection. Negative results do not preclude SARS-CoV-2 infection, do not rule out co-infections with other pathogens, and should not be used as the sole basis for treatment or other patient management decisions. Negative results must be combined with clinical observations, patient history, and epidemiological information. The expected result is Negative. Fact Sheet for Patients: SugarRoll.be Fact Sheet for Healthcare Providers: https://www.woods-mathews.com/ This test is not yet approved or cleared by the [place] FDA and  has been authorized for detection and/or diagnosis of SARS-CoV-2 by FDA under an Emergency Use Authorization (EUA). This EUA will remain  in effect (meaning this test can be used) for the duration of the COVID-19 declaration under Section 56 4(b)(1) of the Act, 21 U.S.C. section 360bbb-3(b)(1), unless the authorization is terminated or revoked sooner. Performed at [facility], [place] [address] [place], [place] [address]   MRSA PCR Screening     Status: None   Collection Time: 03/09/19  2:50 PM   Specimen: STOOL; Nasopharyngeal  Result Value Ref Range Status   MRSA by PCR NEGATIVE NEGATIVE Final    Comment:        The GeneXpert MRSA Assay (FDA approved for NASAL specimens only), is one component of a comprehensive MRSA colonization surveillance program. It is not intended to diagnose MRSA infection nor to guide or monitor treatment for MRSA infections. Performed at [facility], [place] [address] [place], [place] [address]     Michel Bickers, [place] [facility] [phone] pager   [phone] cell 03/10/2019, 9:29 AM

## 2019-03-10 NOTE — Progress Notes (Signed)
Echocardiogram 2D Echocardiogram has been performed.  Oneal Deputy Zyla Dascenzo 03/10/2019, 10:53 AM

## 2019-03-11 ENCOUNTER — Inpatient Hospital Stay (HOSPITAL_COMMUNITY): Payer: Medicare HMO

## 2019-03-11 DIAGNOSIS — T82898A Other specified complication of vascular prosthetic devices, implants and grafts, initial encounter: Secondary | ICD-10-CM

## 2019-03-11 HISTORY — PX: IR REMOVAL TUN CV CATH W/O FL: IMG2289

## 2019-03-11 LAB — RENAL FUNCTION PANEL
Albumin: 1.8 g/dL — ABNORMAL LOW (ref 3.5–5.0)
Anion gap: 15 (ref 5–15)
BUN: 49 mg/dL — ABNORMAL HIGH (ref 8–23)
CO2: 25 mmol/L (ref 22–32)
Calcium: 8.1 mg/dL — ABNORMAL LOW (ref 8.9–10.3)
Chloride: 91 mmol/L — ABNORMAL LOW (ref 98–111)
Creatinine, Ser: 7.94 mg/dL — ABNORMAL HIGH (ref 0.44–1.00)
GFR calc Af Amer: 5 mL/min — ABNORMAL LOW
GFR calc non Af Amer: 5 mL/min — ABNORMAL LOW
Glucose, Bld: 121 mg/dL — ABNORMAL HIGH (ref 70–99)
Phosphorus: 5.4 mg/dL — ABNORMAL HIGH (ref 2.5–4.6)
Potassium: 3.2 mmol/L — ABNORMAL LOW (ref 3.5–5.1)
Sodium: 131 mmol/L — ABNORMAL LOW (ref 135–145)

## 2019-03-11 LAB — PROCALCITONIN: Procalcitonin: 10.45 ng/mL

## 2019-03-11 LAB — GI PATHOGEN PANEL BY PCR, STOOL

## 2019-03-11 LAB — GLUCOSE, CAPILLARY
Glucose-Capillary: 134 mg/dL — ABNORMAL HIGH (ref 70–99)
Glucose-Capillary: 108 mg/dL — ABNORMAL HIGH (ref 70–99)
Glucose-Capillary: 202 mg/dL — ABNORMAL HIGH (ref 70–99)
Glucose-Capillary: 132 mg/dL — ABNORMAL HIGH (ref 70–99)

## 2019-03-11 LAB — CBC
HCT: 26.2 % — ABNORMAL LOW (ref 36.0–46.0)
Hemoglobin: 8.4 g/dL — ABNORMAL LOW (ref 12.0–15.0)
MCH: 29.4 pg (ref 26.0–34.0)
MCHC: 32.1 g/dL (ref 30.0–36.0)
MCV: 91.6 fL (ref 80.0–100.0)
Platelets: 261 K/uL (ref 150–400)
RBC: 2.86 MIL/uL — ABNORMAL LOW (ref 3.87–5.11)
RDW: 15 % (ref 11.5–15.5)
WBC: 12.2 K/uL — ABNORMAL HIGH (ref 4.0–10.5)
nRBC: 0 % (ref 0.0–0.2)

## 2019-03-11 MED ORDER — CHLORHEXIDINE GLUCONATE 4 % EX LIQD
CUTANEOUS | Status: DC | PRN
  Administered 2019-03-11: 1 via TOPICAL

## 2019-03-11 MED ORDER — LIDOCAINE HCL (PF) 1 % IJ SOLN
INTRAMUSCULAR | Status: DC | PRN
  Administered 2019-03-11: 10 mL

## 2019-03-11 MED ORDER — HEPARIN SODIUM (PORCINE) 1000 UNIT/ML IJ SOLN
INTRAMUSCULAR | Status: AC
  Filled 2019-03-11: qty 3

## 2019-03-11 MED ORDER — CHLORHEXIDINE GLUCONATE 4 % EX LIQD
CUTANEOUS | Status: AC
  Filled 2019-03-11: qty 15

## 2019-03-11 MED ORDER — HEPARIN BOLUS VIA INFUSION
3200.0000 [IU] | INTRAVENOUS | Status: DC
  Filled 2019-03-11 (×2): qty 3200

## 2019-03-11 MED ORDER — LIDOCAINE HCL 1 % IJ SOLN
INTRAMUSCULAR | Status: AC
  Filled 2019-03-11: qty 20

## 2019-03-11 NOTE — Procedures (Signed)
Pre procedural Dx: ESRD Post procedural Dx: Same  Successful removal of tunneled right IJ HD catheter  EBL: <1 mL No immediate complications.  Hemostasis achieved with manual pressure. 4 x 4 gauze + tegaderm applied. Routine wound care until healed - may shower 24H post removal.   Please see imaging section of Epic for full dictation.  Joaquim Nam PA-C 03/11/2019 12:11 PM

## 2019-03-11 NOTE — Procedures (Signed)
Seen and examined on dialysis.  Procedure supervised.  Blood pressure 162/88 on HD.  Tolerating goal.  Cannulated with one needle with small infiltration this AM per nursing. Also using tunneled catheter RIJ. BF 250 per protocol.  Remove tunneled catheter today for holiday given bacteremia.    [name], MD 03/11/2019 8:16 AM

## 2019-03-11 NOTE — Progress Notes (Signed)
Subjective: Bianca Myers seen at dialysis today. Bianca Myers reports Bianca Myers continues to feel poorly with lack of energy. Bianca Myers reports her right shoulder pain was much improved with the lidocaine patch. We discussed the plan and Bianca Myers is amenable.  Consults: nephrology, ID  Objective:  Vital signs in last 24 hours: Vitals:   03/10/19 1623 03/10/19 2134 03/11/19 0656 03/11/19 0730  BP: (!) 152/84 (!) 171/69 (!) 147/101   Pulse: 68 69 67   Resp: 18 20 20    Temp: 98.4 F (36.9 C) 98.5 F (36.9 C) 98.1 F (36.7 C)   TempSrc:  Oral Oral   SpO2: 93% 99% 100% 100%  Weight:    77.7 kg  Height:       Physical Exam  Constitutional: Bianca Myers is well-developed, well-nourished, and in no distress. No distress.  Cardiovascular: Normal rate and regular rhythm.  Pulmonary/Chest: Effort normal. No respiratory distress.  Abdominal: Soft. Bowel sounds are normal. Bianca Myers exhibits no distension.  Musculoskeletal:     Cervical back: Normal range of motion.  Neurological: Bianca Myers is alert.  Skin: Skin is warm and dry. Bianca Myers is not diaphoretic.  Psychiatric: Affect normal.  Nursing note and vitals reviewed.  I/Os: No intake or output data in the 24 hours ending 03/11/19 0845 Labs: Results for orders placed or performed during the hospital encounter of 03/09/19 (from the past 24 hour(s))  Culture, blood (routine x 2)     Status: None (Preliminary result)   Collection Time: 03/10/19 11:00 AM   Specimen: BLOOD RIGHT HAND  Result Value Ref Range   Specimen Description BLOOD RIGHT HAND    Special Requests AEROBIC BOTTLE ONLY Blood Culture adequate volume    Culture      NO GROWTH < 24 HOURS Performed at [facility] [address], [place], [place] [address]    Report Status PENDING   Culture, blood (routine x 2)     Status: None (Preliminary result)   Collection Time: 03/10/19 11:07 AM   Specimen: BLOOD RIGHT HAND  Result Value Ref Range   Specimen Description BLOOD RIGHT HAND    Special Requests AEROBIC BOTTLE  ONLY Blood Culture adequate volume    Culture      NO GROWTH < 24 HOURS Performed at [facility], [place] [address], [place], [place] [address]    Report Status PENDING   Glucose, capillary     Status: Abnormal   Collection Time: 03/10/19 11:40 AM  Result Value Ref Range   Glucose-Capillary 149 (H) 70 - 99 mg/dL  Glucose, capillary     Status: Abnormal   Collection Time: 03/10/19  4:25 PM  Result Value Ref Range   Glucose-Capillary 120 (H) 70 - 99 mg/dL  Glucose, capillary     Status: Abnormal   Collection Time: 03/10/19  9:35 PM  Result Value Ref Range   Glucose-Capillary 134 (H) 70 - 99 mg/dL  Glucose, capillary     Status: Abnormal   Collection Time: 03/11/19  6:56 AM  Result Value Ref Range   Glucose-Capillary 134 (H) 70 - 99 mg/dL  CBC     Status: Abnormal   Collection Time: 03/11/19  7:08 AM  Result Value Ref Range   WBC 12.2 (H) 4.0 - 10.5 K/uL   RBC 2.86 (L) 3.87 - 5.11 MIL/uL   Hemoglobin 8.4 (L) 12.0 - 15.0 g/dL   HCT 26.2 (L) 36.0 - 46.0 %   MCV 91.6 80.0 - 100.0 fL   MCH 29.4 26.0 -  34.0 pg   MCHC 32.1 30.0 - 36.0 g/dL   RDW 15.0 11.5 - 15.5 %   Platelets 261 150 - 400 K/uL   nRBC 0.0 0.0 - 0.2 %    Imaging: ECHOCARDIOGRAM COMPLETE  Result Date: 03/10/2019   ECHOCARDIOGRAM REPORT   Bianca Myers Name:   Bianca Myers Date of Exam: 03/10/2019 Medical Rec #:  [id number]         Height:       66.5 in Accession #:    [id number]        Weight:       171.7 lb Date of Birth:  April 06, 1945        BSA:          1.88 m Bianca Myers Age:    74 years          BP:           159/93 mmHg Bianca Myers Gender: F                 HR:           68 bpm. Exam Location:  Inpatient Procedure: 2D Echo, Color Doppler and Cardiac Doppler Indications:    Bacteremia  History:        Bianca Myers has prior history of Echocardiogram examinations, most                 recent 09/01/2018. Risk Factors:Hypertension and Diabetes. ESRD.  Sonographer:    Raquel Sarna Senior RDCS Referring Phys: [place]  1.  Left ventricular ejection fraction, by visual estimation, is 60 to 65%. The left ventricle has normal function. There is no left ventricular hypertrophy.  2. Left ventricular diastolic parameters are consistent with Grade II diastolic dysfunction (pseudonormalization).  3. The left ventricle has no regional wall motion abnormalities.  4. Global right ventricle has normal systolic function.The right ventricular size is normal. No increase in right ventricular wall thickness.  5. Left atrial size was mild-moderately dilated.  6. Right atrial size was normal.  7. The mitral valve is degenerative. Trivial mitral valve regurgitation.  8. The tricuspid valve is normal in structure.  9. The aortic valve was not well visualized. Aortic valve regurgitation is trivial. Mild aortic valve stenosis. 10. The pulmonic valve was grossly normal. Pulmonic valve regurgitation is not visualized. 11. The aortic root was not well visualized. 12. Mildly elevated pulmonary artery systolic pressure. 13. The inferior vena cava is normal in size with greater than 50% respiratory variability, suggesting right atrial pressure of 3 mmHg. 14. No clear evidence of endocarditis on this study. If clinically appropriate, consider TEE. FINDINGS  Left Ventricle: Left ventricular ejection fraction, by visual estimation, is 60 to 65%. The left ventricle has normal function. The left ventricle has no regional wall motion abnormalities. There is no left ventricular hypertrophy. Left ventricular diastolic parameters are consistent with Grade II diastolic dysfunction (pseudonormalization). Right Ventricle: The right ventricular size is normal. No increase in right ventricular wall thickness. Global RV systolic function is has normal systolic function. The tricuspid regurgitant velocity is 2.84 m/s, and with an assumed right atrial pressure  of 3 mmHg, the estimated right ventricular systolic pressure is mildly elevated at 35.3 mmHg. Left Atrium: Left atrial  size was mild-moderately dilated. Right Atrium: Right atrial size was normal in size Pericardium: There is no evidence of pericardial effusion. Mitral Valve: The mitral valve is degenerative in appearance. There is mild thickening of the mitral valve leaflet(s). There is mild calcification of  the mitral valve leaflet(s). Trivial mitral valve regurgitation. Tricuspid Valve: The tricuspid valve is normal in structure. Tricuspid valve regurgitation is trivial. Aortic Valve: The aortic valve was not well visualized. . There is mild thickening and mild calcification of the aortic valve. Aortic valve regurgitation is trivial. Mild aortic stenosis is present. Mild aortic valve annular calcification. There is mild thickening of the aortic valve. There is mild calcification of the aortic valve. Aortic valve mean gradient measures 11.0 mmHg. Aortic valve peak gradient measures 20.6 mmHg. Aortic valve area, by VTI measures 1.98 cm. Pulmonic Valve: The pulmonic valve was grossly normal. Pulmonic valve regurgitation is not visualized. Pulmonic regurgitation is not visualized. Aorta: The aortic root was not well visualized. Venous: The inferior vena cava is normal in size with greater than 50% respiratory variability, suggesting right atrial pressure of 3 mmHg. IAS/Shunts: No atrial level shunt detected by color flow Doppler.  LEFT VENTRICLE PLAX 2D LVIDd:         4.90 cm  Diastology LVIDs:         3.20 cm  LV e' lateral: 5.98 cm/s LV PW:         1.20 cm  LV e' medial:  6.20 cm/s LV IVS:        1.10 cm LVOT diam:     2.00 cm LV SV:         72 ml LV SV Index:   37.25 LVOT Area:     3.14 cm  RIGHT VENTRICLE RV S prime:     14.70 cm/s TAPSE (M-mode): 2.3 cm LEFT ATRIUM             Index       RIGHT ATRIUM           Index LA diam:        3.40 cm 1.80 cm/m  RA Area:     14.20 cm LA Vol (A2C):   47.5 ml 25.20 ml/m RA Volume:   34.60 ml  18.36 ml/m LA Vol (A4C):   83.1 ml 44.09 ml/m LA Biplane Vol: 67.2 ml 35.65 ml/m  AORTIC  VALVE AV Area (Vmax):    2.02 cm AV Area (Vmean):   1.72 cm AV Area (VTI):     1.98 cm AV Vmax:           227.00 cm/s AV Vmean:          154.000 cm/s AV VTI:            0.458 m AV Peak Grad:      20.6 mmHg AV Mean Grad:      11.0 mmHg LVOT Vmax:         146.00 cm/s LVOT Vmean:        84.100 cm/s LVOT VTI:          0.288 m LVOT/AV VTI ratio: 0.63  AORTA Ao Root diam: 2.90 cm Ao Asc diam:  3.30 cm MV A velocity: 99.00 cm/s 70.3 cm/s TRICUSPID VALVE                                     TR Peak grad:   32.3 mmHg                                     TR Vmax:  284.00 cm/s                                      SHUNTS                                     Systemic VTI:  0.29 m                                     Systemic Diam: 2.00 cm  Buford Dresser MD Electronically signed by Buford Dresser MD Signature Date/Time: 03/10/2019/4:35:21 PM    Final    Assessment/Plan:  Assessment & Plan by Problem:  Bianca Myers is a 74 yo F w/ a PMHx significant for ESRD on HD on MWF, HTN, diet controlled DM, a hx of hypoxia and dyspnea on exertion of unclear etiology on 3L O2 at home, anemia of chronic renal failure and chronic depression who presents with 10 days of shortness of breath, diarrhea, fatigue and anorexia found to have an elevated WBC, a chest x-ray concerning for multifocal pneumonia and MSSA bacteremia.  Active Problems:   HCAP (healthcare-associated pneumonia)   MSSA Bacteremia -Bianca Myers is a 74 yo F w/ a PMHx significant for ESRD on HD on MWF, a hx of hypoxia and dyspnea on exertion of unclear etiology on 3L O2 at home (worked up for HF and 06/20 echo with EV of 60-65% without evidence of high output failure), distant but currently inactive pulmonary sarcoidosis and anemia of chronic renal failure who presents with 10 days of shortness of breath, fatigue and anorexia found to have an elevated WBC, a chest x-ray concerning for multifocal pneumonia and MSSA bacteremia for which Bianca Myers is on ancef    -ID consulted and recommends continuing ancef, dialysis catheter removal/replacement, ECHO and repeat blood cxs -ECHO without evidence of endocarditis, TEE may be required, will defer to ID; mild aortic stenosis identified on ECHO will note in d/c summary for PCP fu -WBC down to 12 today -AVF being used in dialysis today; plan for catheter removal  Plan: -fu ID recs -continue ancef -daily CBC -monitor fever curve -continue home O2, attempt to wean -fu legionnaires test  Diarrhea: -Bianca Myers reports hx of 10 days of watery diarrhea, around 8-10 episodes/day -Bianca Myers denies associated abdominal pain, nausea or vomiting, however, Bianca Myers does report some episodes of fecal incontinence during this time -Bianca Myers denies recent antibiotic use -on exam Bianca Myers has no abdominal tenderness -no diarrhea since admission  Plan: -fu GIP -strict I/Os -daily CBC -monitor fever curve -fu legionnaires -continue antibiotics as above  ESRD: -Bianca Myers with ESRD on HD MWF  -seen during dialysis today; Bianca Myers using AVF so catheter will likely be able to be removed  Plan: -fu nephrology recs  R Shoulder Pain: -Bianca Myers with reported hx of a R rotator cuff tear complaining of pain since admission unrelieved by tylenol -lidocaine patch effective  Plan: -continue lidocaine patch  HTN: -continue home amlodipine 5 mg daily, labetalol 200 mg BID and lasix per nephro  DM: -diet controlled DM -SSI while admitted  Dispo: Anticipated discharge pending clinical course.  Al Decant, MD 03/11/2019, 8:45 AM Pager: [phone]

## 2019-03-11 NOTE — Progress Notes (Signed)
Patient ID: Bianca Myers, female   DOB: 02-16-1946, 74 y.o.   MRN: [id number]         [facility]  Date of Admission:  03/09/2019           Day 3 cefazolin ASSESSMENT: He had MSSA bacteremia, most likely from her abscess catheter which is now been removed.  There is no evidence for endocarditis by exam or TTE.  I talked to her about the pros and cons of TEE.  She would like to get home as soon as possible in light of the surging Covid pandemic.  I favor foregoing TEE and treating her with cefazolin dose at 6 weeks  PLAN: 1. Continue cefazolin for 6 weeks through 04/20/2019 2. I will sign off now  Principal Problem:   MSSA bacteremia Active Problems:   HCAP (healthcare-associated pneumonia)   End-stage renal disease on hemodialysis ([place])   Anemia of chronic kidney failure, stage 5 (HCC)   Chronic depression   Patient is Jehovah's Witness   Essential hypertension   Diabetes mellitus ([place])   History of sarcoidosis   Mild aortic stenosis   Scheduled Meds: . amLODipine  5 mg Oral QHS  . atorvastatin  10 mg Oral QHS  . chlorhexidine      . Chlorhexidine Gluconate Cloth  6 each Topical Q0600  . darbepoetin (ARANESP) injection - DIALYSIS  25 mcg Intravenous Q Wed-HD  . feeding supplement (NEPRO CARB STEADY)  237 mL Oral TID BM  . ferric citrate  420 mg Oral TID WC  . furosemide  80 mg Oral Once per day on Sun Tue Thu Sat  . heparin      . heparin  3,200 Units Intravenous Q M,W,F-HD  . heparin  5,000 Units Subcutaneous Q8H  . insulin aspart  0-9 Units Subcutaneous TID WC  . labetalol  200 mg Oral BID  . lidocaine  1 patch Transdermal Q24H  . lidocaine      . lidocaine-prilocaine  1 application Topical UD  . mouth rinse  15 mL Mouth Rinse BID  . multivitamin  1 tablet Oral QHS   Continuous Infusions: . sodium chloride 250 mL (03/10/19 0610)  .  ceFAZolin (ANCEF) IV 1 g (03/10/19 0611)   PRN Meds:.sodium chloride, acetaminophen **OR**  acetaminophen, chlorhexidine, lidocaine (PF), ondansetron **OR** ondansetron (ZOFRAN) IV, polyethylene glycol   SUBJECTIVE: She remains extremely tired.  Review of Systems: Review of Systems  Constitutional: Negative for chills and fever.    No Known Allergies  OBJECTIVE: Vitals:   03/11/19 1030 03/11/19 1047 03/11/19 1049 03/11/19 1137  BP: 124/79 117/80 (!) 147/80 132/85  Pulse: 79 74 69 70  Resp: (!) 22 (!) 24 (!) 23 (!) 22  Temp:  98.1 F (36.7 C) 98.1 F (36.7 C) 98.5 F (36.9 C)  TempSrc:  Oral Oral Oral  SpO2: 100% 100% 100% 100%  Weight:      Height:       Body mass index is 27.23 kg/m.  Physical Exam Constitutional:      Comments: She is resting quietly in bed watching television.  Cardiovascular:     Rate and Rhythm: Normal rate and regular rhythm.     Heart sounds: No murmur.  Pulmonary:     Effort: Pulmonary effort is normal.     Breath sounds: Normal breath sounds.  Chest:       Lab Results Lab Results  Component Value Date   WBC 12.2 (H) 03/11/2019   HGB  8.4 (L) 03/11/2019   HCT 26.2 (L) 03/11/2019   MCV 91.6 03/11/2019   PLT 261 03/11/2019    Lab Results  Component Value Date   CREATININE 7.94 (H) 03/11/2019   BUN 49 (H) 03/11/2019   NA 131 (L) 03/11/2019   K 3.2 (L) 03/11/2019   CL 91 (L) 03/11/2019   CO2 25 03/11/2019    Lab Results  Component Value Date   ALT 19 03/09/2019   AST 15 03/09/2019   ALKPHOS 75 03/09/2019   BILITOT 0.4 03/09/2019     Microbiology: Recent Results (from the past 240 hour(s))  GI pathogen panel by PCR, stool     Status: None   Collection Time: 03/09/19 10:05 AM   Specimen: STOOL  Result Value Ref Range Status   Plesiomonas shigelloides NOT DETECTED NOT DETECTED Final   Yersinia enterocolitica NOT DETECTED NOT DETECTED Final   Vibrio NOT DETECTED NOT DETECTED Final   Enteropathogenic E coli NOT DETECTED NOT DETECTED Final   E coli (ETEC) LT/ST NOT DETECTED NOT DETECTED Final   E coli 6269 by  PCR Not applicable NOT DETECTED Final   Cryptosporidium by PCR NOT DETECTED NOT DETECTED Final   Entamoeba histolytica NOT DETECTED NOT DETECTED Final   Adenovirus F 40/41 NOT DETECTED NOT DETECTED Final   Norovirus GI/GII NOT DETECTED NOT DETECTED Final   Sapovirus NOT DETECTED NOT DETECTED Final    Comment: (NOTE) Performed At: [facility] [place], [place] [id number] Rush Farmer MD JK:[id number]    Vibrio cholerae NOT DETECTED NOT DETECTED Final   Campylobacter by PCR NOT DETECTED NOT DETECTED Final   Salmonella by PCR NOT DETECTED NOT DETECTED Final   E coli (STEC) NOT DETECTED NOT DETECTED Final   Enteroaggregative E coli NOT DETECTED NOT DETECTED Final   Shigella by PCR NOT DETECTED NOT DETECTED Final   Cyclospora cayetanensis NOT DETECTED NOT DETECTED Final   Astrovirus NOT DETECTED NOT DETECTED Final   G lamblia by PCR NOT DETECTED NOT DETECTED Final   Rotavirus A by PCR NOT DETECTED NOT DETECTED Final  Urine culture     Status: None   Collection Time: 03/09/19 10:32 AM   Specimen: Urine, Catheterized  Result Value Ref Range Status   Specimen Description URINE, CATHETERIZED  Final   Special Requests NONE  Final   Culture   Final    NO GROWTH Performed at [facility], [address], [place], [place] [address]    Report Status 03/10/2019 FINAL  Final  Blood culture (routine x 2)     Status: Abnormal (Preliminary result)   Collection Time: 03/09/19 11:07 AM   Specimen: BLOOD  Result Value Ref Range Status   Specimen Description BLOOD SITE NOT SPECIFIED  Final   Special Requests   Final    BOTTLES DRAWN AEROBIC AND ANAEROBIC Blood Culture adequate volume   Culture  Setup Time   Final    IN BOTH AEROBIC AND ANAEROBIC BOTTLES GRAM POSITIVE COCCI CRITICAL VALUE NOTED.  VALUE IS CONSISTENT WITH PREVIOUSLY REPORTED AND CALLED VALUE. Performed at [facility], [place] [address], [place], [place] [address]    Culture STAPHYLOCOCCUS AUREUS  (A)  Final   Report Status PENDING  Incomplete  Blood culture (routine x 2)     Status: Abnormal (Preliminary result)   Collection Time: 03/09/19 11:07 AM   Specimen: BLOOD  Result Value Ref Range Status   Specimen Description BLOOD SITE NOT SPECIFIED  Final  Special Requests   Final    BOTTLES DRAWN AEROBIC AND ANAEROBIC Blood Culture results may not be optimal due to an inadequate volume of blood received in culture bottles   Culture  Setup Time   Final    IN BOTH AEROBIC AND ANAEROBIC BOTTLES GRAM POSITIVE COCCI Organism ID to follow CRITICAL RESULT CALLED TO, READ BACK BY AND VERIFIED WITH: V BRYK PHARMD 03/10/19 0401 JDW    Culture (A)  Final    STAPHYLOCOCCUS AUREUS SUSCEPTIBILITIES TO FOLLOW Performed at [facility], [place] [address], [place], [place] [address]    Report Status PENDING  Incomplete  Blood Culture ID Panel (Reflexed)     Status: Abnormal   Collection Time: 03/09/19 11:07 AM  Result Value Ref Range Status   Enterococcus species NOT DETECTED NOT DETECTED Final   Listeria monocytogenes NOT DETECTED NOT DETECTED Final   Staphylococcus species DETECTED (A) NOT DETECTED Final    Comment: CRITICAL RESULT CALLED TO, READ BACK BY AND VERIFIED WITH: V BRYK PHARMD 03/10/19 0401 JDW    Staphylococcus aureus (BCID) DETECTED (A) NOT DETECTED Final    Comment: Methicillin (oxacillin) susceptible Staphylococcus aureus (MSSA). Preferred therapy is anti staphylococcal beta lactam antibiotic (Cefazolin or Nafcillin), unless clinically contraindicated. CRITICAL RESULT CALLED TO, READ BACK BY AND VERIFIED WITH: V BRYK PHARMD 03/10/19 0401 JDW    Methicillin resistance NOT DETECTED NOT DETECTED Final   Streptococcus species NOT DETECTED NOT DETECTED Final   Streptococcus agalactiae NOT DETECTED NOT DETECTED Final   Streptococcus pneumoniae NOT DETECTED NOT DETECTED Final   Streptococcus pyogenes NOT DETECTED NOT DETECTED Final   Acinetobacter baumannii NOT DETECTED NOT DETECTED  Final   Enterobacteriaceae species NOT DETECTED NOT DETECTED Final   Enterobacter cloacae complex NOT DETECTED NOT DETECTED Final   Escherichia coli NOT DETECTED NOT DETECTED Final   Klebsiella oxytoca NOT DETECTED NOT DETECTED Final   Klebsiella pneumoniae NOT DETECTED NOT DETECTED Final   Proteus species NOT DETECTED NOT DETECTED Final   Serratia marcescens NOT DETECTED NOT DETECTED Final   Haemophilus influenzae NOT DETECTED NOT DETECTED Final   Neisseria meningitidis NOT DETECTED NOT DETECTED Final   Pseudomonas aeruginosa NOT DETECTED NOT DETECTED Final   Candida albicans NOT DETECTED NOT DETECTED Final   Candida glabrata NOT DETECTED NOT DETECTED Final   Candida krusei NOT DETECTED NOT DETECTED Final   Candida parapsilosis NOT DETECTED NOT DETECTED Final   Candida tropicalis NOT DETECTED NOT DETECTED Final    Comment: Performed at [facility], [place] [address], [place], [place] [address]  SARS CORONAVIRUS 2 (TAT 6-24 HRS)     Status: None   Collection Time: 03/09/19 12:30 PM  Result Value Ref Range Status   SARS Coronavirus 2 NEGATIVE NEGATIVE Final    Comment: (NOTE) SARS-CoV-2 target nucleic acids are NOT DETECTED. The SARS-CoV-2 RNA is generally detectable in upper and lower respiratory specimens during the acute phase of infection. Negative results do not preclude SARS-CoV-2 infection, do not rule out co-infections with other pathogens, and should not be used as the sole basis for treatment or other patient management decisions. Negative results must be combined with clinical observations, patient history, and epidemiological information. The expected result is Negative. Fact Sheet for Patients: SugarRoll.be Fact Sheet for Healthcare Providers: https://www.woods-mathews.com/ This test is not yet approved or cleared by the [place] FDA and  has been authorized for detection and/or diagnosis of SARS-CoV-2 by FDA under an  Emergency Use Authorization (EUA). This EUA will  remain  in effect (meaning this test can be used) for the duration of the COVID-19 declaration under Section 56 4(b)(1) of the Act, 21 U.S.C. section 360bbb-3(b)(1), unless the authorization is terminated or revoked sooner. Performed at [facility], [place] [address] [place], [place] [address]   MRSA PCR Screening     Status: None   Collection Time: 03/09/19  2:50 PM   Specimen: STOOL; Nasopharyngeal  Result Value Ref Range Status   MRSA by PCR NEGATIVE NEGATIVE Final    Comment:        The GeneXpert MRSA Assay (FDA approved for NASAL specimens only), is one component of a comprehensive MRSA colonization surveillance program. It is not intended to diagnose MRSA infection nor to guide or monitor treatment for MRSA infections. Performed at [facility], [place] [address] [place], [place] [address]   Culture, blood (routine x 2)     Status: None (Preliminary result)   Collection Time: 03/10/19 11:00 AM   Specimen: BLOOD RIGHT HAND  Result Value Ref Range Status   Specimen Description BLOOD RIGHT HAND  Final   Special Requests AEROBIC BOTTLE ONLY Blood Culture adequate volume  Final   Culture   Final    NO GROWTH < 24 HOURS Performed at [facility], [place] [address], [place], [place] [address]    Report Status PENDING  Incomplete  Culture, blood (routine x 2)     Status: None (Preliminary result)   Collection Time: 03/10/19 11:07 AM   Specimen: BLOOD RIGHT HAND  Result Value Ref Range Status   Specimen Description BLOOD RIGHT HAND  Final   Special Requests AEROBIC BOTTLE ONLY Blood Culture adequate volume  Final   Culture   Final    NO GROWTH < 24 HOURS Performed at [facility], [place] [address], [place], [place] [address]    Report Status PENDING  Incomplete    Michel Bickers, MD [facility] [place] [facility] [phone] pager   [phone] cell 03/11/2019, 3:28 PM

## 2019-03-11 NOTE — Progress Notes (Signed)
Bianca Myers Progress Note   Subjective:  Seen on HD - using AVF 1:1. Still feeling weak and run down with chest pressure. No SOB.   Objective Vitals:   03/10/19 1623 03/10/19 2134 03/11/19 0656 03/11/19 0730  BP: (!) 152/84 (!) 171/69 (!) 147/101 (!) 150/85  Pulse: 68 69 67 65  Resp: 18 20 20 18   Temp: 98.4 F (36.9 C) 98.5 F (36.9 C) 98.1 F (36.7 C) (!) 77.7 F (25.4 C)  TempSrc:  Oral Oral Oral  SpO2: 93% 99% 100% 100%  Weight:    77.7 kg  Height:       Physical Exam General: Pleasant woman, NAD Heart: RRR; no murmur Lungs: CTA anteriorly Abdomen: soft, non-tender Extremities: No LE edema Dialysis Access: TDC + LUE AVF - cannulated 1:1  Additional Objective Labs: Basic Metabolic Panel: Recent Labs  Lab 03/09/19 1037 03/10/19 0358 03/11/19 0708  NA 136 135 131*  K 4.2 4.1 3.2*  CL 96* 92* 91*  CO2 25 29 25   GLUCOSE 162* 121* 121*  BUN 49* 27* 49*  CREATININE 8.53* 5.42* 7.94*  CALCIUM 8.8* 8.3* 8.1*  PHOS  --  5.1* 5.4*   Liver Function Tests: Recent Labs  Lab 03/09/19 1037 03/10/19 0358 03/11/19 0708  AST 15  --   --   ALT 19  --   --   ALKPHOS 75  --   --   BILITOT 0.4  --   --   PROT 7.2  --   --   ALBUMIN 2.1* 1.9* 1.8*   Recent Labs  Lab 03/09/19 1037  LIPASE 15   CBC: Recent Labs  Lab 03/09/19 0848 03/09/19 0902 03/10/19 0358 03/11/19 0708  WBC 25.5*  --  19.7* 12.2*  HGB 9.5* 10.9* 8.8* 8.4*  HCT 28.5* 32.0* 25.9* 26.2*  MCV 89.9  --  89.0 91.6  PLT 240  --  229 261   Blood Culture    Component Value Date/Time   SDES BLOOD RIGHT HAND 03/10/2019 1107   SPECREQUEST AEROBIC BOTTLE ONLY Blood Culture adequate volume 03/10/2019 1107   CULT  03/10/2019 1107    NO GROWTH < 24 HOURS Performed at [facility], [place] [address], [place], [place] [address]    REPTSTATUS PENDING 03/10/2019 1107   CBG: Recent Labs  Lab 03/10/19 0728 03/10/19 1140 03/10/19 1625 03/10/19 2135 03/11/19 0656  GLUCAP 129*  149* 120* 134* 134*   Studies/Results: DG Chest 2 View  Result Date: 03/09/2019 CLINICAL DATA:  Shortness of breath and weakness, went to dialysis this morning, history end-stage renal disease, diabetes mellitus, hypertension, secondary hyperparathyroidism, former smoker EXAM: CHEST - 2 VIEW COMPARISON:  09/02/2018 FINDINGS: RIGHT jugular dual-lumen dialysis catheter with tip projecting over SVC. Normal heart size, mediastinal contours, and pulmonary vascularity. Atherosclerotic calcification aorta. RIGHT upper lobe scarring and volume loss. Infiltrates are identified at the mid to lower LEFT lung and at the base of the RIGHT lung, could represent asymmetric pulmonary edema or infection. Tiny BILATERAL pleural effusions blunt the posterior costophrenic angles. No pneumothorax. Bones demineralized. Vascular stents LEFT upper extremity. IMPRESSION: Asymmetric pulmonary infiltrates LEFT greater than RIGHT question asymmetric pulmonary edema versus multifocal infection. RIGHT upper lobe scarring and volume loss. Aortic Atherosclerosis (ICD10-I70.0). Electronically Signed   By: Lavonia Dana M.D.   On: 03/09/2019 09:18   ECHOCARDIOGRAM COMPLETE  Result Date: 03/10/2019   ECHOCARDIOGRAM REPORT   Patient Name:   Bianca Myers Date of Exam: 03/10/2019 Medical Rec #:  [id number]         Height:       66.5 in Accession #:    [id number]        Weight:       171.7 lb Date of Birth:  May 26, 1945        BSA:          1.88 m Patient Age:    74 years          BP:           159/93 mmHg Patient Gender: F                 HR:           68 bpm. Exam Location:  Inpatient Procedure: 2D Echo, Color Doppler and Cardiac Doppler Indications:    Bacteremia  History:        Patient has prior history of Echocardiogram examinations, most                 recent 09/01/2018. Risk Factors:Hypertension and Diabetes. ESRD.  Sonographer:    Raquel Sarna Senior RDCS Referring Phys: [place]  1. Left ventricular ejection fraction, by  visual estimation, is 60 to 65%. The left ventricle has normal function. There is no left ventricular hypertrophy.  2. Left ventricular diastolic parameters are consistent with Grade II diastolic dysfunction (pseudonormalization).  3. The left ventricle has no regional wall motion abnormalities.  4. Global right ventricle has normal systolic function.The right ventricular size is normal. No increase in right ventricular wall thickness.  5. Left atrial size was mild-moderately dilated.  6. Right atrial size was normal.  7. The mitral valve is degenerative. Trivial mitral valve regurgitation.  8. The tricuspid valve is normal in structure.  9. The aortic valve was not well visualized. Aortic valve regurgitation is trivial. Mild aortic valve stenosis. 10. The pulmonic valve was grossly normal. Pulmonic valve regurgitation is not visualized. 11. The aortic root was not well visualized. 12. Mildly elevated pulmonary artery systolic pressure. 13. The inferior vena cava is normal in size with greater than 50% respiratory variability, suggesting right atrial pressure of 3 mmHg. 14. No clear evidence of endocarditis on this study. If clinically appropriate, consider TEE. FINDINGS  Left Ventricle: Left ventricular ejection fraction, by visual estimation, is 60 to 65%. The left ventricle has normal function. The left ventricle has no regional wall motion abnormalities. There is no left ventricular hypertrophy. Left ventricular diastolic parameters are consistent with Grade II diastolic dysfunction (pseudonormalization). Right Ventricle: The right ventricular size is normal. No increase in right ventricular wall thickness. Global RV systolic function is has normal systolic function. The tricuspid regurgitant velocity is 2.84 m/s, and with an assumed right atrial pressure  of 3 mmHg, the estimated right ventricular systolic pressure is mildly elevated at 35.3 mmHg. Left Atrium: Left atrial size was mild-moderately dilated. Right  Atrium: Right atrial size was normal in size Pericardium: There is no evidence of pericardial effusion. Mitral Valve: The mitral valve is degenerative in appearance. There is mild thickening of the mitral valve leaflet(s). There is mild calcification of the mitral valve leaflet(s). Trivial mitral valve regurgitation. Tricuspid Valve: The tricuspid valve is normal in structure. Tricuspid valve regurgitation is trivial. Aortic Valve: The aortic valve was not well visualized. . There is mild thickening and mild calcification of the aortic valve. Aortic valve regurgitation is trivial. Mild aortic stenosis is present. Mild aortic valve annular calcification. There is mild thickening of the  aortic valve. There is mild calcification of the aortic valve. Aortic valve mean gradient measures 11.0 mmHg. Aortic valve peak gradient measures 20.6 mmHg. Aortic valve area, by VTI measures 1.98 cm. Pulmonic Valve: The pulmonic valve was grossly normal. Pulmonic valve regurgitation is not visualized. Pulmonic regurgitation is not visualized. Aorta: The aortic root was not well visualized. Venous: The inferior vena cava is normal in size with greater than 50% respiratory variability, suggesting right atrial pressure of 3 mmHg. IAS/Shunts: No atrial level shunt detected by color flow Doppler.  LEFT VENTRICLE PLAX 2D LVIDd:         4.90 cm  Diastology LVIDs:         3.20 cm  LV e' lateral: 5.98 cm/s LV PW:         1.20 cm  LV e' medial:  6.20 cm/s LV IVS:        1.10 cm LVOT diam:     2.00 cm LV SV:         72 ml LV SV Index:   37.25 LVOT Area:     3.14 cm  RIGHT VENTRICLE RV S prime:     14.70 cm/s TAPSE (M-mode): 2.3 cm LEFT ATRIUM             Index       RIGHT ATRIUM           Index LA diam:        3.40 cm 1.80 cm/m  RA Area:     14.20 cm LA Vol (A2C):   47.5 ml 25.20 ml/m RA Volume:   34.60 ml  18.36 ml/m LA Vol (A4C):   83.1 ml 44.09 ml/m LA Biplane Vol: 67.2 ml 35.65 ml/m  AORTIC VALVE AV Area (Vmax):    2.02 cm AV Area  (Vmean):   1.72 cm AV Area (VTI):     1.98 cm AV Vmax:           227.00 cm/s AV Vmean:          154.000 cm/s AV VTI:            0.458 m AV Peak Grad:      20.6 mmHg AV Mean Grad:      11.0 mmHg LVOT Vmax:         146.00 cm/s LVOT Vmean:        84.100 cm/s LVOT VTI:          0.288 m LVOT/AV VTI ratio: 0.63  AORTA Ao Root diam: 2.90 cm Ao Asc diam:  3.30 cm MV A velocity: 99.00 cm/s 70.3 cm/s TRICUSPID VALVE                                     TR Peak grad:   32.3 mmHg                                     TR Vmax:        284.00 cm/s                                      SHUNTS  Systemic VTI:  0.29 m                                     Systemic Diam: 2.00 cm  Buford Dresser MD Electronically signed by Buford Dresser MD Signature Date/Time: 03/10/2019/4:35:21 PM    Final    Medications: . sodium chloride    . sodium chloride    . sodium chloride 250 mL (03/10/19 0610)  .  ceFAZolin (ANCEF) IV 1 g (03/10/19 1914)   . amLODipine  5 mg Oral QHS  . atorvastatin  10 mg Oral QHS  . Chlorhexidine Gluconate Cloth  6 each Topical Q0600  . darbepoetin (ARANESP) injection - DIALYSIS  25 mcg Intravenous Q Wed-HD  . feeding supplement (NEPRO CARB STEADY)  237 mL Oral TID BM  . ferric citrate  420 mg Oral TID WC  . furosemide  80 mg Oral Once per day on Sun Tue Thu Sat  . heparin  3,200 Units Intravenous Q M,W,F-HD  . heparin  5,000 Units Subcutaneous Q8H  . insulin aspart  0-9 Units Subcutaneous TID WC  . labetalol  200 mg Oral BID  . lidocaine  1 patch Transdermal Q24H  . lidocaine-prilocaine  1 application Topical UD  . mouth rinse  15 mL Mouth Rinse BID  . multivitamin  1 tablet Oral QHS    Dialysis Orders:  MWF 4 hrs 180NRe 400/800 79 kg 2.0 K/2.25 Ca UFP 4 TDC No heparin -No ESA. PATIENT IS A JEHOVAH WITNESS-DO NOT TRANSFUSE -No VDRA - Binder: Auryxia 210 mg 2 tabs PO TID AC  Assessment/Plan: 1. HCAP: Initially Azithromycin/Cefepime -> now  Cefazolin, per primary. COVID 19 negative.  2.  MSSA Bacteremia: BCx 1/6 positive - repeat BCx 1/7 negative so far. No vegetations on TTE. 3. ESRD: Continue HD per MWF schedule - HD today. Using AVF 1:1 today, TDC will be removed after HD for line holiday. Hopefully can use AVF without issues going further. 4. Diarrhea: On admit - C.Diff negative. 5. Hypertension/volume: BP high, home meds resumed. Challenging down EDW - will be ~76kg. 6. Anemia: Hgb 8.4 - Aranesp 28mcg given 03/09/19. + [employer].  7. Metabolic bone disease: CorrCa/Phos both up slightly. No VDRA. Continue home binders Lorin Picket). 8. Nutrition: Alb very low - continue Nepro supplements. 10. DM: Per primary.   Veneta Penton, PA-C 03/11/2019, 9:04 AM  [facility] Pager: [phone]

## 2019-03-12 LAB — GLUCOSE, CAPILLARY
Glucose-Capillary: 133 mg/dL — ABNORMAL HIGH (ref 70–99)
Glucose-Capillary: 127 mg/dL — ABNORMAL HIGH (ref 70–99)
Glucose-Capillary: 112 mg/dL — ABNORMAL HIGH (ref 70–99)
Glucose-Capillary: 135 mg/dL — ABNORMAL HIGH (ref 70–99)

## 2019-03-12 LAB — RENAL FUNCTION PANEL
Albumin: 1.9 g/dL — ABNORMAL LOW (ref 3.5–5.0)
Anion gap: 13 (ref 5–15)
BUN: 36 mg/dL — ABNORMAL HIGH (ref 8–23)
CO2: 28 mmol/L (ref 22–32)
Calcium: 8.2 mg/dL — ABNORMAL LOW (ref 8.9–10.3)
Chloride: 89 mmol/L — ABNORMAL LOW (ref 98–111)
Creatinine, Ser: 6.68 mg/dL — ABNORMAL HIGH (ref 0.44–1.00)
GFR calc Af Amer: 7 mL/min — ABNORMAL LOW (ref >60)
GFR calc non Af Amer: 6 mL/min — ABNORMAL LOW (ref >60)
Glucose, Bld: 121 mg/dL — ABNORMAL HIGH (ref 70–99)
Phosphorus: 5 mg/dL — ABNORMAL HIGH (ref 2.5–4.6)
Potassium: 3.3 mmol/L — ABNORMAL LOW (ref 3.5–5.1)
Sodium: 130 mmol/L — ABNORMAL LOW (ref 135–145)

## 2019-03-12 LAB — CULTURE, BLOOD (ROUTINE X 2): Special Requests: ADEQUATE

## 2019-03-12 LAB — CBC
HCT: 27.4 % — ABNORMAL LOW (ref 36.0–46.0)
Hemoglobin: 8.9 g/dL — ABNORMAL LOW (ref 12.0–15.0)
MCH: 29.5 pg (ref 26.0–34.0)
MCHC: 32.5 g/dL (ref 30.0–36.0)
MCV: 90.7 fL (ref 80.0–100.0)
Platelets: 283 10*3/uL (ref 150–400)
RBC: 3.02 MIL/uL — ABNORMAL LOW (ref 3.87–5.11)
RDW: 15 % (ref 11.5–15.5)
WBC: 8.3 10*3/uL (ref 4.0–10.5)
nRBC: 0 % (ref 0.0–0.2)

## 2019-03-12 NOTE — Plan of Care (Signed)
  Problem: Education: Goal: Knowledge of General Education information will improve Description Including pain rating scale, medication(s)/side effects and non-pharmacologic comfort measures Outcome: Progressing   

## 2019-03-12 NOTE — Discharge Summary (Signed)
Name: Bianca Myers MRN: [id number] DOB: 1945-07-11 74 y.o. PCP: [name], MD  Date of Admission: 03/09/2019  8:32 AM Date of Discharge:  03/14/19 Attending Physician: Lucious Groves, DO  Discharge Diagnosis:  1. HCAP/MSSA Bacteremia 2. ESRD 3. R Shoulder Pain  Discharge Medications: Allergies as of 03/14/2019   No Known Allergies     Medication List    STOP taking these medications   HYDROcodone-acetaminophen 5-325 MG tablet Commonly known as: NORCO/VICODIN     TAKE these medications   acetaminophen 650 MG CR tablet Commonly known as: TYLENOL Take 1,300 mg by mouth every 8 (eight) hours as needed for pain.   amLODipine 5 MG tablet Commonly known as: NORVASC Take 5 mg by mouth at bedtime.   atorvastatin 10 MG tablet Commonly known as: LIPITOR Take 10 mg by mouth at bedtime.   Auryxia 1 GM 210 MG(Fe) tablet Generic drug: ferric citrate Take 420 mg by mouth 3 (three) times daily with meals.   ceFAZolin 2-4 GM/100ML-% IVPB Commonly known as: ANCEF Inject 100 mLs (2 g total) into the vein 3 (three) times a week for 9 doses. With dialysis through 04/20/2019   Fish Oil 1000 MG Caps Take 1,000 mg by mouth 2 (two) times daily.   furosemide 40 MG tablet Commonly known as: LASIX Take 1 tablet (40 mg total) by mouth every Tuesday, Thursday, Saturday, and Sunday. What changed: how much to take   labetalol 200 MG tablet Commonly known as: NORMODYNE Take 200 mg by mouth 2 (two) times daily.   lidocaine-prilocaine cream Commonly known as: EMLA Apply 1 application topically as directed. Prior to Dialysis   multivitamin Tabs tablet Take 1 tablet by mouth at bedtime.   polyethylene glycol 17 g packet Commonly known as: MiraLax Take 17 g by mouth daily as needed for moderate constipation.   Turmeric 500 MG Caps Take 500 mg by mouth daily.   vitamin C 1000 MG tablet Take 1,000 mg by mouth 2 (two) times a day.       Disposition and follow-up:     Bianca Myers was discharged from [facility] in Stable condition.  At the hospital follow up visit please address:   1.  Ensure antibiotic treatment at dialysis. Ensure patient strength improving. Address shoulder pain and need for surgical intervention.  2.  Labs / imaging needed at time of follow-up: na  3.  Pending labs/ test needing follow-up: na  Follow-up Appointments: Follow-up Information    [name], MD Follow up in 1 week(s).   Specialty: Family Medicine Contact information: [address] [place] [address] [facility] Course by problem list:  1. HCAP/MSSA Bacteremia -Ms. Meaney is a 74 yo F w/ a PMHx significant for ESRD on HD on MWF, a hx of hypoxia and dyspnea on exertion of unclear etiology on 3L O2 at home (worked up for HF and 06/20 echo with EV of 60-65% without evidence of high output failure), distant but currently inactive pulmonary sarcoidosis and anemia of chronic renal failure who presents with 10 days of shortness of breath, fatigue and anorexia found to have an elevated WBC, a chest x-ray concerning for multifocal pneumonia and MSSA bacteremia for which she has been treated with Ancef -Echo without evidence of endocarditis, no TEE at this time per ID  -dialysis catheter removed -pt afebrile w/ resolved leukocytosis -can receive 6 weeks IV cefazolin  with dialysis (through 04/20/2019)  2. ESRD -pt with ESRD on HD MWF  -patient used AVF with dialysis -tunneled catheter removed  -will get IV antibiotics with outpatient dialysis as above, 2 g ancef MWF  3. R Shoulder Pain -pt with reported hx of a R rotator cuff tear complaining of pain since admission unrelieved by tylenol -lidocaine patch effective  Discharge Vitals:   BP (!) 159/87   Pulse 62   Temp 98.6 F (37 C) (Oral)   Resp 16   Ht 5' 6.5" (1.689 m)   Wt 78 kg   SpO2 100%   BMI 27.34 kg/m   Pertinent Labs, Studies,  and Procedures: CBC Latest Ref Rng & Units 03/14/2019 03/13/2019 03/12/2019  WBC 4.0 - 10.5 K/uL 9.8 8.2 8.3  Hemoglobin 12.0 - 15.0 g/dL 8.2(L) 8.8(L) 8.9(L)  Hematocrit 36.0 - 46.0 % 25.5(L) 27.0(L) 27.4(L)  Platelets 150 - 400 K/uL 319 298 283   BMP Latest Ref Rng & Units 03/14/2019 03/13/2019 03/12/2019  Glucose 70 - 99 mg/dL 109(H) 113(H) 121(H)  BUN 8 - 23 mg/dL 62(H) 46(H) 36(H)  Creatinine 0.44 - 1.00 mg/dL 11.00(H) 8.49(H) 6.68(H)  Sodium 135 - 145 mmol/L 131(L) 129(L) 130(L)  Potassium 3.5 - 5.1 mmol/L 4.0 3.2(L) 3.3(L)  Chloride 98 - 111 mmol/L 92(L) 91(L) 89(L)  CO2 22 - 32 mmol/L 23 27 28   Calcium 8.9 - 10.3 mg/dL 8.2(L) 8.1(L) 8.2(L)   DG Chest 2 View  Result Date: 03/09/2019 CLINICAL DATA:  Shortness of breath and weakness, went to dialysis this morning, history end-stage renal disease, diabetes mellitus, hypertension, secondary hyperparathyroidism, former smoker EXAM: CHEST - 2 VIEW COMPARISON:  09/02/2018 FINDINGS: RIGHT jugular dual-lumen dialysis catheter with tip projecting over SVC. Normal heart size, mediastinal contours, and pulmonary vascularity. Atherosclerotic calcification aorta. RIGHT upper lobe scarring and volume loss. Infiltrates are identified at the mid to lower LEFT lung and at the base of the RIGHT lung, could represent asymmetric pulmonary edema or infection. Tiny BILATERAL pleural effusions blunt the posterior costophrenic angles. No pneumothorax. Bones demineralized. Vascular stents LEFT upper extremity. IMPRESSION: Asymmetric pulmonary infiltrates LEFT greater than RIGHT question asymmetric pulmonary edema versus multifocal infection. RIGHT upper lobe scarring and volume loss. Aortic Atherosclerosis (ICD10-I70.0). Electronically Signed   By: Lavonia Dana M.D.   On: 03/09/2019 09:18   MR BRAIN WO CONTRAST  Result Date: 02/28/2019 CLINICAL DATA:  Neuro deficit, acute, stroke suspected. Altered mental status (AMS), unclear cause. Additional history provided: Several  days of confusion/altered mental status as well as right arm weakness/coordination difficulties. EXAM: MRI HEAD WITHOUT CONTRAST TECHNIQUE: Multiplanar, multiecho pulse sequences of the brain and surrounding structures were obtained without intravenous contrast. COMPARISON:  Head CT 08/20/2018 FINDINGS: Brain: There is no convincing evidence of acute infarct. No evidence of intracranial mass. No midline shift or extra-axial fluid collection. No chronic intracranial blood products. Moderate patchy T2/FLAIR hyperintensity within the cerebral white matter is nonspecific, but consistent with chronic small vessel ischemic disease. Mild chronic small vessel ischemic changes also present within the brainstem. There are multiple chronic lacunar infarcts within the bilateral basal ganglia and thalami. Mild generalized parenchymal atrophy. Partially empty sella turcica. Redemonstrated are multiple small midline lipomas along the falx. Vascular: Flow voids maintained within the proximal large arterial vessels. Skull and upper cervical spine: No focal marrow lesion. Sinuses/Orbits: Bilateral lens replacements. Minimal scattered paranasal sinus mucosal thickening. Trace fluid within bilateral mastoid air cells. IMPRESSION: No evidence of acute intracranial abnormality. Generalized parenchymal atrophy with moderate chronic small  vessel ischemic disease. Chronic lacunar infarcts within the bilateral basal ganglia and thalami. Electronically Signed   By: Kellie Simmering DO   On: 02/28/2019 09:36   IR Removal Tun Cv Cath W/O FL  Result Date: 03/11/2019 INDICATION: Patient with history of end-stage renal disease on hemodialysis via tunneled right IJ hemodialysis catheter now with working left upper extremity fistula, currently being treated for bacteremia with concern that HD catheter is source. Request to IR for removal of tunneled HD catheter. EXAM: REMOVAL OF TUNNELED HEMODIALYSIS CATHETER MEDICATIONS: 4 mL 1% lidocaine  COMPLICATIONS: None immediate. PROCEDURE: Informed written consent was obtained from the patient following an explanation of the procedure, risks, benefits and alternatives to treatment. A time out was performed prior to the initiation of the procedure. Maximal barrier sterile technique was utilized including caps, mask, sterile gowns, sterile gloves, large sterile drape, hand hygiene, and Hibiclens. 1% lidocaine with epinephrine was injected under sterile conditions along the subcutaneous tunnel. Utilizing a combination of blunt dissection and gentle traction, the catheter was removed intact. Hemostasis was obtained with manual compression. A dressing was placed. The patient tolerated the procedure well without immediate post procedural complication. IMPRESSION: Successful removal of tunneled dialysis catheter. Read by Candiss Norse, PA-C Electronically Signed   By: Markus Daft M.D.   On: 03/11/2019 12:15   ECHOCARDIOGRAM COMPLETE  Result Date: 03/10/2019   ECHOCARDIOGRAM REPORT   Patient Name:   SHUNTIA EXTON Date of Exam: 03/10/2019 Medical Rec #:  [id number]         Height:       66.5 in Accession #:    [id number]        Weight:       171.7 lb Date of Birth:  1945-04-22        BSA:          1.88 m Patient Age:    33 years          BP:           159/93 mmHg Patient Gender: F                 HR:           68 bpm. Exam Location:  Inpatient Procedure: 2D Echo, Color Doppler and Cardiac Doppler Indications:    Bacteremia  History:        Patient has prior history of Echocardiogram examinations, most                 recent 09/01/2018. Risk Factors:Hypertension and Diabetes. ESRD.  Sonographer:    Raquel Sarna Senior RDCS Referring Phys: Benedict  1. Left ventricular ejection fraction, by visual estimation, is 60 to 65%. The left ventricle has normal function. There is no left ventricular hypertrophy.  2. Left ventricular diastolic parameters are consistent with Grade II diastolic dysfunction  (pseudonormalization).  3. The left ventricle has no regional wall motion abnormalities.  4. Global right ventricle has normal systolic function.The right ventricular size is normal. No increase in right ventricular wall thickness.  5. Left atrial size was mild-moderately dilated.  6. Right atrial size was normal.  7. The mitral valve is degenerative. Trivial mitral valve regurgitation.  8. The tricuspid valve is normal in structure.  9. The aortic valve was not well visualized. Aortic valve regurgitation is trivial. Mild aortic valve stenosis. 10. The pulmonic valve was grossly normal. Pulmonic valve regurgitation is not visualized. 11. The aortic root was not well visualized. 12. Mildly elevated pulmonary  artery systolic pressure. 13. The inferior vena cava is normal in size with greater than 50% respiratory variability, suggesting right atrial pressure of 3 mmHg. 14. No clear evidence of endocarditis on this study. If clinically appropriate, consider TEE. FINDINGS  Left Ventricle: Left ventricular ejection fraction, by visual estimation, is 60 to 65%. The left ventricle has normal function. The left ventricle has no regional wall motion abnormalities. There is no left ventricular hypertrophy. Left ventricular diastolic parameters are consistent with Grade II diastolic dysfunction (pseudonormalization). Right Ventricle: The right ventricular size is normal. No increase in right ventricular wall thickness. Global RV systolic function is has normal systolic function. The tricuspid regurgitant velocity is 2.84 m/s, and with an assumed right atrial pressure  of 3 mmHg, the estimated right ventricular systolic pressure is mildly elevated at 35.3 mmHg. Left Atrium: Left atrial size was mild-moderately dilated. Right Atrium: Right atrial size was normal in size Pericardium: There is no evidence of pericardial effusion. Mitral Valve: The mitral valve is degenerative in appearance. There is mild thickening of the mitral  valve leaflet(s). There is mild calcification of the mitral valve leaflet(s). Trivial mitral valve regurgitation. Tricuspid Valve: The tricuspid valve is normal in structure. Tricuspid valve regurgitation is trivial. Aortic Valve: The aortic valve was not well visualized. . There is mild thickening and mild calcification of the aortic valve. Aortic valve regurgitation is trivial. Mild aortic stenosis is present. Mild aortic valve annular calcification. There is mild thickening of the aortic valve. There is mild calcification of the aortic valve. Aortic valve mean gradient measures 11.0 mmHg. Aortic valve peak gradient measures 20.6 mmHg. Aortic valve area, by VTI measures 1.98 cm. Pulmonic Valve: The pulmonic valve was grossly normal. Pulmonic valve regurgitation is not visualized. Pulmonic regurgitation is not visualized. Aorta: The aortic root was not well visualized. Venous: The inferior vena cava is normal in size with greater than 50% respiratory variability, suggesting right atrial pressure of 3 mmHg. IAS/Shunts: No atrial level shunt detected by color flow Doppler.  LEFT VENTRICLE PLAX 2D LVIDd:         4.90 cm  Diastology LVIDs:         3.20 cm  LV e' lateral: 5.98 cm/s LV PW:         1.20 cm  LV e' medial:  6.20 cm/s LV IVS:        1.10 cm LVOT diam:     2.00 cm LV SV:         72 ml LV SV Index:   37.25 LVOT Area:     3.14 cm  RIGHT VENTRICLE RV S prime:     14.70 cm/s TAPSE (M-mode): 2.3 cm LEFT ATRIUM             Index       RIGHT ATRIUM           Index LA diam:        3.40 cm 1.80 cm/m  RA Area:     14.20 cm LA Vol (A2C):   47.5 ml 25.20 ml/m RA Volume:   34.60 ml  18.36 ml/m LA Vol (A4C):   83.1 ml 44.09 ml/m LA Biplane Vol: 67.2 ml 35.65 ml/m  AORTIC VALVE AV Area (Vmax):    2.02 cm AV Area (Vmean):   1.72 cm AV Area (VTI):     1.98 cm AV Vmax:           227.00 cm/s AV Vmean:  154.000 cm/s AV VTI:            0.458 m AV Peak Grad:      20.6 mmHg AV Mean Grad:      11.0 mmHg LVOT Vmax:          146.00 cm/s LVOT Vmean:        84.100 cm/s LVOT VTI:          0.288 m LVOT/AV VTI ratio: 0.63  AORTA Ao Root diam: 2.90 cm Ao Asc diam:  3.30 cm MV A velocity: 99.00 cm/s 70.3 cm/s TRICUSPID VALVE                                     TR Peak grad:   32.3 mmHg                                     TR Vmax:        284.00 cm/s                                      SHUNTS                                     Systemic VTI:  0.29 m                                     Systemic Diam: 2.00 cm  Buford Dresser MD Electronically signed by Buford Dresser MD Signature Date/Time: 03/10/2019/4:35:21 PM    Final    Discharge Instructions: Discharge Instructions    Diet - low sodium heart healthy   Complete by: As directed       Signed: Al Decant, MD 03/14/2019, 2:47 PM   Pager: [phone]

## 2019-03-12 NOTE — Progress Notes (Signed)
[facility] Progress Note   Subjective:  Seen in room and spoke to hospitalist team. Plan is for discharge home tomorrow - will need Cefazolin with HD x 6 week course. She still feels weak - no CP or dyspnea. TDC removed yesterday.  Objective Vitals:   03/11/19 1837 03/11/19 2135 03/12/19 0414 03/12/19 0800  BP: 128/89 (!) 142/74 129/72 (!) 150/78  Pulse: 67 71 67 66  Resp: 20 16 18 16   Temp: 98.6 F (37 C) 98.7 F (37.1 C) 97.9 F (36.6 C) 98.6 F (37 C)  TempSrc: Oral Oral Oral Oral  SpO2: 100% 97% 96% 96%  Weight:  77.4 kg    Height:       Physical Exam General: Pleasant woman, NAD Heart: RRR; no murmur Lungs: CTA anteriorly Abdomen: soft, non-tender Extremities: No LE edema Dialysis Access: LUE AVF + thrill. TDC has been removed, bandage clean/dry.  Additional Objective Labs: Basic Metabolic Panel: Recent Labs  Lab 03/09/19 1037 03/10/19 0358 03/11/19 0708  NA 136 135 131*  K 4.2 4.1 3.2*  CL 96* 92* 91*  CO2 25 29 25   GLUCOSE 162* 121* 121*  BUN 49* 27* 49*  CREATININE 8.53* 5.42* 7.94*  CALCIUM 8.8* 8.3* 8.1*  PHOS  --  5.1* 5.4*   Liver Function Tests: Recent Labs  Lab 03/09/19 1037 03/10/19 0358 03/11/19 0708  AST 15  --   --   ALT 19  --   --   ALKPHOS 75  --   --   BILITOT 0.4  --   --   PROT 7.2  --   --   ALBUMIN 2.1* 1.9* 1.8*   CBC: Recent Labs  Lab 03/09/19 0848 03/10/19 0358 03/11/19 0708 03/12/19 0537  WBC 25.5* 19.7* 12.2* 8.3  HGB 9.5* 8.8* 8.4* 8.9*  HCT 28.5* 25.9* 26.2* 27.4*  MCV 89.9 89.0 91.6 90.7  PLT 240 229 261 283   Blood Culture    Component Value Date/Time   SDES BLOOD RIGHT HAND 03/10/2019 1107   SPECREQUEST AEROBIC BOTTLE ONLY Blood Culture adequate volume 03/10/2019 1107   CULT  03/10/2019 1107    NO GROWTH 2 DAYS Performed at [facility], [place] [address] [place], [place] [address]    REPTSTATUS PENDING 03/10/2019 1107   CBG: Recent Labs  Lab 03/11/19 0656 03/11/19 1136  03/11/19 1643 03/11/19 2136 03/12/19 0655  GLUCAP 134* 108* 202* 132* 133*   Studies/Results: IR Removal Tun Cv Cath W/O FL  Result Date: 03/11/2019 INDICATION: Patient with history of end-stage renal disease on hemodialysis via tunneled right IJ hemodialysis catheter now with working left upper extremity fistula, currently being treated for bacteremia with concern that HD catheter is source. Request to IR for removal of tunneled HD catheter. EXAM: REMOVAL OF TUNNELED HEMODIALYSIS CATHETER MEDICATIONS: 4 mL 1% lidocaine COMPLICATIONS: None immediate. PROCEDURE: Informed written consent was obtained from the patient following an explanation of the procedure, risks, benefits and alternatives to treatment. A time out was performed prior to the initiation of the procedure. Maximal barrier sterile technique was utilized including caps, mask, sterile gowns, sterile gloves, large sterile drape, hand hygiene, and Hibiclens. 1% lidocaine with epinephrine was injected under sterile conditions along the subcutaneous tunnel. Utilizing a combination of blunt dissection and gentle traction, the catheter was removed intact. Hemostasis was obtained with manual compression. A dressing was placed. The patient tolerated the procedure well without immediate post procedural complication. IMPRESSION: Successful removal of tunneled dialysis catheter. Read by Larene Beach  Harriet Pho, PA-C Electronically Signed   By: Markus Daft M.D.   On: 03/11/2019 12:15   ECHOCARDIOGRAM COMPLETE  Result Date: 03/10/2019   ECHOCARDIOGRAM REPORT   Patient Name:   Bianca Myers Date of Exam: 03/10/2019 Medical Rec #:  [id number]         Height:       66.5 in Accession #:    [id number]        Weight:       171.7 lb Date of Birth:  March 08, 1945        BSA:          1.88 m Patient Age:    74 years          BP:           159/93 mmHg Patient Gender: F                 HR:           68 bpm. Exam Location:  Inpatient Procedure: 2D Echo, Color Doppler and  Cardiac Doppler Indications:    Bacteremia  History:        Patient has prior history of Echocardiogram examinations, most                 recent 09/01/2018. Risk Factors:Hypertension and Diabetes. ESRD.  Sonographer:    Raquel Sarna Senior RDCS Referring Phys: [place]  1. Left ventricular ejection fraction, by visual estimation, is 60 to 65%. The left ventricle has normal function. There is no left ventricular hypertrophy.  2. Left ventricular diastolic parameters are consistent with Grade II diastolic dysfunction (pseudonormalization).  3. The left ventricle has no regional wall motion abnormalities.  4. Global right ventricle has normal systolic function.The right ventricular size is normal. No increase in right ventricular wall thickness.  5. Left atrial size was mild-moderately dilated.  6. Right atrial size was normal.  7. The mitral valve is degenerative. Trivial mitral valve regurgitation.  8. The tricuspid valve is normal in structure.  9. The aortic valve was not well visualized. Aortic valve regurgitation is trivial. Mild aortic valve stenosis. 10. The pulmonic valve was grossly normal. Pulmonic valve regurgitation is not visualized. 11. The aortic root was not well visualized. 12. Mildly elevated pulmonary artery systolic pressure. 13. The inferior vena cava is normal in size with greater than 50% respiratory variability, suggesting right atrial pressure of 3 mmHg. 14. No clear evidence of endocarditis on this study. If clinically appropriate, consider TEE. FINDINGS  Left Ventricle: Left ventricular ejection fraction, by visual estimation, is 60 to 65%. The left ventricle has normal function. The left ventricle has no regional wall motion abnormalities. There is no left ventricular hypertrophy. Left ventricular diastolic parameters are consistent with Grade II diastolic dysfunction (pseudonormalization). Right Ventricle: The right ventricular size is normal. No increase in right ventricular  wall thickness. Global RV systolic function is has normal systolic function. The tricuspid regurgitant velocity is 2.84 m/s, and with an assumed right atrial pressure  of 3 mmHg, the estimated right ventricular systolic pressure is mildly elevated at 35.3 mmHg. Left Atrium: Left atrial size was mild-moderately dilated. Right Atrium: Right atrial size was normal in size Pericardium: There is no evidence of pericardial effusion. Mitral Valve: The mitral valve is degenerative in appearance. There is mild thickening of the mitral valve leaflet(s). There is mild calcification of the mitral valve leaflet(s). Trivial mitral valve regurgitation. Tricuspid Valve: The tricuspid valve is normal in structure. Tricuspid valve regurgitation  is trivial. Aortic Valve: The aortic valve was not well visualized. . There is mild thickening and mild calcification of the aortic valve. Aortic valve regurgitation is trivial. Mild aortic stenosis is present. Mild aortic valve annular calcification. There is mild thickening of the aortic valve. There is mild calcification of the aortic valve. Aortic valve mean gradient measures 11.0 mmHg. Aortic valve peak gradient measures 20.6 mmHg. Aortic valve area, by VTI measures 1.98 cm. Pulmonic Valve: The pulmonic valve was grossly normal. Pulmonic valve regurgitation is not visualized. Pulmonic regurgitation is not visualized. Aorta: The aortic root was not well visualized. Venous: The inferior vena cava is normal in size with greater than 50% respiratory variability, suggesting right atrial pressure of 3 mmHg. IAS/Shunts: No atrial level shunt detected by color flow Doppler.  LEFT VENTRICLE PLAX 2D LVIDd:         4.90 cm  Diastology LVIDs:         3.20 cm  LV e' lateral: 5.98 cm/s LV PW:         1.20 cm  LV e' medial:  6.20 cm/s LV IVS:        1.10 cm LVOT diam:     2.00 cm LV SV:         72 ml LV SV Index:   37.25 LVOT Area:     3.14 cm  RIGHT VENTRICLE RV S prime:     14.70 cm/s TAPSE  (M-mode): 2.3 cm LEFT ATRIUM             Index       RIGHT ATRIUM           Index LA diam:        3.40 cm 1.80 cm/m  RA Area:     14.20 cm LA Vol (A2C):   47.5 ml 25.20 ml/m RA Volume:   34.60 ml  18.36 ml/m LA Vol (A4C):   83.1 ml 44.09 ml/m LA Biplane Vol: 67.2 ml 35.65 ml/m  AORTIC VALVE AV Area (Vmax):    2.02 cm AV Area (Vmean):   1.72 cm AV Area (VTI):     1.98 cm AV Vmax:           227.00 cm/s AV Vmean:          154.000 cm/s AV VTI:            0.458 m AV Peak Grad:      20.6 mmHg AV Mean Grad:      11.0 mmHg LVOT Vmax:         146.00 cm/s LVOT Vmean:        84.100 cm/s LVOT VTI:          0.288 m LVOT/AV VTI ratio: 0.63  AORTA Ao Root diam: 2.90 cm Ao Asc diam:  3.30 cm MV A velocity: 99.00 cm/s 70.3 cm/s TRICUSPID VALVE                                     TR Peak grad:   32.3 mmHg                                     TR Vmax:        284.00 cm/s  SHUNTS                                     Systemic VTI:  0.29 m                                     Systemic Diam: 2.00 cm  Buford Dresser MD Electronically signed by Buford Dresser MD Signature Date/Time: 03/10/2019/4:35:21 PM    Final    Medications: . sodium chloride 250 mL (03/10/19 0610)  .  ceFAZolin (ANCEF) IV 1 g (03/11/19 2320)   . amLODipine  5 mg Oral QHS  . atorvastatin  10 mg Oral QHS  . Chlorhexidine Gluconate Cloth  6 each Topical Q0600  . darbepoetin (ARANESP) injection - DIALYSIS  25 mcg Intravenous Q Wed-HD  . feeding supplement (NEPRO CARB STEADY)  237 mL Oral TID BM  . ferric citrate  420 mg Oral TID WC  . furosemide  80 mg Oral Once per day on Sun Tue Thu Sat  . heparin  3,200 Units Intravenous Q M,W,F-HD  . heparin  5,000 Units Subcutaneous Q8H  . insulin aspart  0-9 Units Subcutaneous TID WC  . labetalol  200 mg Oral BID  . lidocaine  1 patch Transdermal Q24H  . lidocaine-prilocaine  1 application Topical UD  . mouth rinse  15 mL Mouth Rinse BID  . multivitamin  1  tablet Oral QHS    Dialysis Orders: [place] MWF 4 hrs 180NRe 400/800 79 kg 2.0 K/2.25 Ca UFP 4 TDC No heparin -No ESA. PATIENT IS A JEHOVAH WITNESS-DO NOT TRANSFUSE -No VDRA - Binder: Auryxia 210 mg 2 tabs PO TID AC  Assessment/Plan: 1. HCAP: Initially Azithromycin/Cefepime -> now Cefazolin, per primary. COVID 19 negative. 2. MSSA Bacteremia: BCx 1/6 positive - repeat BCx 1/7 negative so far. No vegetations on TTE. Plan is Cefazolin x 6 weeks with HD (thru 04/20/19). 3. ESRD: Continue HD per MWF schedule - next 03/14/19. Hopefully will be able to use AVF without issues on Monday. 4. Diarrhea: On admit - C.Diff negative. 5. Hypertension/volume: BP high, home meds resumed. Challenging down EDW - will be ~76kg. 6. Anemia: Hgb 8.9 - Aranesp 71mcg given 03/09/19. + Jehovah's Witness.  7. Metabolic bone disease: CorrCa/Phos both up slightly. No VDRA. Continue home binders Lorin Picket). 8. Nutrition: Alb very low - continue Nepro supplements. 10.DM: Per primary.   Veneta Penton, PA-C 03/12/2019, 10:29 AM  [facility] Pager: [phone]

## 2019-03-12 NOTE — Progress Notes (Signed)
Subjective: Patient seen at bedside on rounds today. She reports that she continues to feel weak. She says she feels ready to go home tomorrow because her son will be able to be there to help her.  Consults: nephrology, ID  Objective:  Vital signs in last 24 hours: Vitals:   03/11/19 1837 03/11/19 2135 03/12/19 0414 03/12/19 0800  BP: 128/89 (!) 142/74 129/72 (!) 150/78  Pulse: 67 71 67 66  Resp: 20 16 18 16   Temp: 98.6 F (37 C) 98.7 F (37.1 C) 97.9 F (36.6 C) 98.6 F (37 C)  TempSrc: Oral Oral Oral Oral  SpO2: 100% 97% 96% 96%  Weight:  77.4 kg    Height:       Physical Exam  Constitutional: She is well-developed, well-nourished, and in no distress. No distress.  Cardiovascular: Normal rate and regular rhythm.  Pulmonary/Chest: Effort normal. No respiratory distress.  Abdominal: Soft. Bowel sounds are normal. She exhibits no distension.  Musculoskeletal:     Cervical back: Normal range of motion.  Neurological: She is alert.  Skin: Skin is warm and dry. She is not diaphoretic.  Psychiatric: Affect normal.  Nursing note and vitals reviewed.  I/Os:  Intake/Output Summary (Last 24 hours) at 03/12/2019 1025 Last data filed at 03/12/2019 0850 Gross per 24 hour  Intake 480 ml  Output 1500 ml  Net -1020 ml   Labs: Results for orders placed or performed during the hospital encounter of 03/09/19 (from the past 24 hour(s))  Glucose, capillary     Status: Abnormal   Collection Time: 03/11/19 11:36 AM  Result Value Ref Range   Glucose-Capillary 108 (H) 70 - 99 mg/dL  Glucose, capillary     Status: Abnormal   Collection Time: 03/11/19  4:43 PM  Result Value Ref Range   Glucose-Capillary 202 (H) 70 - 99 mg/dL  Glucose, capillary     Status: Abnormal   Collection Time: 03/11/19  9:36 PM  Result Value Ref Range   Glucose-Capillary 132 (H) 70 - 99 mg/dL  CBC     Status: Abnormal   Collection Time: 03/12/19  5:37 AM  Result Value Ref Range   WBC 8.3 4.0 - 10.5 K/uL   RBC 3.02 (L) 3.87 - 5.11 MIL/uL   Hemoglobin 8.9 (L) 12.0 - 15.0 g/dL   HCT 27.4 (L) 36.0 - 46.0 %   MCV 90.7 80.0 - 100.0 fL   MCH 29.5 26.0 - 34.0 pg   MCHC 32.5 30.0 - 36.0 g/dL   RDW 15.0 11.5 - 15.5 %   Platelets 283 150 - 400 K/uL   nRBC 0.0 0.0 - 0.2 %  Glucose, capillary     Status: Abnormal   Collection Time: 03/12/19  6:55 AM  Result Value Ref Range   Glucose-Capillary 133 (H) 70 - 99 mg/dL    Imaging: IR Removal Tun Cv Cath W/O FL  Result Date: 03/11/2019 INDICATION: Patient with history of end-stage renal disease on hemodialysis via tunneled right IJ hemodialysis catheter now with working left upper extremity fistula, currently being treated for bacteremia with concern that HD catheter is source. Request to IR for removal of tunneled HD catheter. EXAM: REMOVAL OF TUNNELED HEMODIALYSIS CATHETER MEDICATIONS: 4 mL 1% lidocaine COMPLICATIONS: None immediate. PROCEDURE: Informed written consent was obtained from the patient following an explanation of the procedure, risks, benefits and alternatives to treatment. A time out was performed prior to the initiation of the procedure. Maximal barrier sterile technique was utilized including caps, mask, sterile  gowns, sterile gloves, large sterile drape, hand hygiene, and Hibiclens. 1% lidocaine with epinephrine was injected under sterile conditions along the subcutaneous tunnel. Utilizing a combination of blunt dissection and gentle traction, the catheter was removed intact. Hemostasis was obtained with manual compression. A dressing was placed. The patient tolerated the procedure well without immediate post procedural complication. IMPRESSION: Successful removal of tunneled dialysis catheter. Read by Candiss Norse, PA-C Electronically Signed   By: Markus Daft M.D.   On: 03/11/2019 12:15   Assessment/Plan:  Assessment & Plan by Problem:  Ms. Bellemare is a 74 yo F w/ a PMHx significant for ESRD on HD on MWF, HTN, diet controlled DM, a hx of  hypoxia and dyspnea on exertion of unclear etiology on 3L O2 at home, anemia of chronic renal failure and chronic depression who presents with 10 days of shortness of breath, diarrhea, fatigue and anorexia found to have an elevated WBC, a chest x-ray concerning for multifocal pneumonia and MSSA bacteremia.  Active Problems:   HCAP (healthcare-associated pneumonia)   MSSA Bacteremia -Ms. Mastrianni is a 74 yo F w/ a PMHx significant for ESRD on HD on MWF, a hx of hypoxia and dyspnea on exertion of unclear etiology on 3L O2 at home (worked up for HF and 06/20 echo with EV of 60-65% without evidence of high output failure), distant but currently inactive pulmonary sarcoidosis and anemia of chronic renal failure who presents with 10 days of shortness of breath, fatigue and anorexia found to have an elevated WBC, a chest x-ray concerning for multifocal pneumonia and MSSA bacteremia for which she is on ancef  -Echo without evidence of endocarditis, no TEE at this time per ID and patient -dialysis catheter removed -pt afebrile w/ resolved leukocytosis -can receive 6 weeks IV cefazolin with dialysis (through 04/20/2019)  Plan: -continue ancef -daily CBC -monitor fever curve -continue home O2  ESRD: -pt with ESRD on HD MWF  -will get IV antibiotics with outpatient dialysis as above  Plan: -fu nephrology recs  HTN: -continue home amlodipine 5 mg daily, labetalol 200 mg BID and lasix per nephro  DM: -diet controlled DM -SSI while admitted  Dispo: Anticipated discharge pending clinical course.  Al Decant, MD 03/12/2019, 10:25 AM Pager: [phone]

## 2019-03-12 NOTE — Discharge Instructions (Signed)
You were admitted to the hospital for pneumonia and an infection in your blood. You were treated with antibiotics. We also removed your dialysis catheter which may have caused your infection. You will continue IV antibiotics at dialysis for a total of 6 weeks. Please follow up with your primary care physician in 1 week.

## 2019-03-13 LAB — RENAL FUNCTION PANEL
Albumin: 1.8 g/dL — ABNORMAL LOW (ref 3.5–5.0)
Anion gap: 11 (ref 5–15)
BUN: 46 mg/dL — ABNORMAL HIGH (ref 8–23)
CO2: 27 mmol/L (ref 22–32)
Calcium: 8.1 mg/dL — ABNORMAL LOW (ref 8.9–10.3)
Chloride: 91 mmol/L — ABNORMAL LOW (ref 98–111)
Creatinine, Ser: 8.49 mg/dL — ABNORMAL HIGH (ref 0.44–1.00)
GFR calc Af Amer: 5 mL/min — ABNORMAL LOW (ref >60)
GFR calc non Af Amer: 4 mL/min — ABNORMAL LOW (ref >60)
Glucose, Bld: 113 mg/dL — ABNORMAL HIGH (ref 70–99)
Phosphorus: 5.3 mg/dL — ABNORMAL HIGH (ref 2.5–4.6)
Potassium: 3.2 mmol/L — ABNORMAL LOW (ref 3.5–5.1)
Sodium: 129 mmol/L — ABNORMAL LOW (ref 135–145)

## 2019-03-13 LAB — GLUCOSE, CAPILLARY
Glucose-Capillary: 99 mg/dL (ref 70–99)
Glucose-Capillary: 117 mg/dL — ABNORMAL HIGH (ref 70–99)
Glucose-Capillary: 103 mg/dL — ABNORMAL HIGH (ref 70–99)
Glucose-Capillary: 159 mg/dL — ABNORMAL HIGH (ref 70–99)

## 2019-03-13 LAB — CBC
HCT: 27 % — ABNORMAL LOW (ref 36.0–46.0)
Hemoglobin: 8.8 g/dL — ABNORMAL LOW (ref 12.0–15.0)
MCH: 29.4 pg (ref 26.0–34.0)
MCHC: 32.6 g/dL (ref 30.0–36.0)
MCV: 90.3 fL (ref 80.0–100.0)
Platelets: 298 10*3/uL (ref 150–400)
RBC: 2.99 MIL/uL — ABNORMAL LOW (ref 3.87–5.11)
RDW: 15 % (ref 11.5–15.5)
WBC: 8.2 10*3/uL (ref 4.0–10.5)
nRBC: 0 % (ref 0.0–0.2)

## 2019-03-13 MED ORDER — PRO-STAT SUGAR FREE PO LIQD
30.0000 mL | Freq: Two times a day (BID) | ORAL | Status: DC
  Filled 2019-03-13 (×3): qty 30

## 2019-03-13 MED ORDER — OXYCODONE HCL 5 MG PO TABS
5.0000 mg | ORAL_TABLET | Freq: Once | ORAL | Status: AC
  Administered 2019-03-13: 5 mg via ORAL
  Filled 2019-03-13: qty 1

## 2019-03-13 NOTE — Progress Notes (Signed)
Pt is complaining of right shoulder pain at 9/10 and recently had Tylenol. RN messaged MD on call to make aware. Pt requesting something that is extra strength.   Eleanora Neighbor, RN

## 2019-03-13 NOTE — Progress Notes (Signed)
[facility] Progress Note   Subjective:  Seen in room. C/o ongoing weakness. No CP/dyspnea. Plan is for HD tomorrorw inpatient to assure that AVF working. If not successful with cannulation, will need new TDC placed.  Objective Vitals:   03/12/19 0800 03/12/19 1730 03/12/19 2121 03/13/19 0558  BP: (!) 150/78 (!) 148/77 (!) 148/80 (!) 145/80  Pulse: 66 64 61 60  Resp: 16 18 16 18   Temp: 98.6 F (37 C) 99.3 F (37.4 C) 98.5 F (36.9 C) 98.2 F (36.8 C)  TempSrc: Oral Oral    SpO2: 96% 100% 99% 97%  Weight:   77.4 kg   Height:       Physical Exam General:Pleasant woman, NAD but appears tired. Heart:RRR; no murmur Lungs:CTA anteriorly Abdomen:soft, non-tender Extremities:No LE edema Dialysis Access:LUE AVF + thrill. TDC has been removed.  Additional Objective Labs: Basic Metabolic Panel: Recent Labs  Lab 03/11/19 0708 03/12/19 0557 03/13/19 0212  NA 131* 130* 129*  K 3.2* 3.3* 3.2*  CL 91* 89* 91*  CO2 25 28 27   GLUCOSE 121* 121* 113*  BUN 49* 36* 46*  CREATININE 7.94* 6.68* 8.49*  CALCIUM 8.1* 8.2* 8.1*  PHOS 5.4* 5.0* 5.3*   Liver Function Tests: Recent Labs  Lab 03/09/19 1037 03/11/19 0708 03/12/19 0557 03/13/19 0212  AST 15  --   --   --   ALT 19  --   --   --   ALKPHOS 75  --   --   --   BILITOT 0.4  --   --   --   PROT 7.2  --   --   --   ALBUMIN 2.1* 1.8* 1.9* 1.8*   Recent Labs  Lab 03/09/19 1037  LIPASE 15   CBC: Recent Labs  Lab 03/09/19 0848 03/10/19 0358 03/11/19 0708 03/12/19 0537 03/13/19 0753  WBC 25.5* 19.7* 12.2* 8.3 8.2  HGB 9.5* 8.8* 8.4* 8.9* 8.8*  HCT 28.5* 25.9* 26.2* 27.4* 27.0*  MCV 89.9 89.0 91.6 90.7 90.3  PLT 240 229 261 283 298   CBG: Recent Labs  Lab 03/12/19 0655 03/12/19 1208 03/12/19 1728 03/12/19 2122 03/13/19 0730  GLUCAP 133* 127* 112* 135* 99   Studies/Results: IR Removal Tun Cv Cath W/O FL  Result Date: 03/11/2019 INDICATION: Patient with history of end-stage renal  disease on hemodialysis via tunneled right IJ hemodialysis catheter now with working left upper extremity fistula, currently being treated for bacteremia with concern that HD catheter is source. Request to IR for removal of tunneled HD catheter. EXAM: REMOVAL OF TUNNELED HEMODIALYSIS CATHETER MEDICATIONS: 4 mL 1% lidocaine COMPLICATIONS: None immediate. PROCEDURE: Informed written consent was obtained from the patient following an explanation of the procedure, risks, benefits and alternatives to treatment. A time out was performed prior to the initiation of the procedure. Maximal barrier sterile technique was utilized including caps, mask, sterile gowns, sterile gloves, large sterile drape, hand hygiene, and Hibiclens. 1% lidocaine with epinephrine was injected under sterile conditions along the subcutaneous tunnel. Utilizing a combination of blunt dissection and gentle traction, the catheter was removed intact. Hemostasis was obtained with manual compression. A dressing was placed. The patient tolerated the procedure well without immediate post procedural complication. IMPRESSION: Successful removal of tunneled dialysis catheter. Read by Candiss Norse, PA-C Electronically Signed   By: Markus Daft M.D.   On: 03/11/2019 12:15   Medications: . sodium chloride 250 mL (03/10/19 0610)  .  ceFAZolin (ANCEF) IV 1 g (03/12/19 2226)   .  amLODipine  5 mg Oral QHS  . atorvastatin  10 mg Oral QHS  . Chlorhexidine Gluconate Cloth  6 each Topical Q0600  . darbepoetin (ARANESP) injection - DIALYSIS  25 mcg Intravenous Q Wed-HD  . feeding supplement (NEPRO CARB STEADY)  237 mL Oral TID BM  . ferric citrate  420 mg Oral TID WC  . furosemide  80 mg Oral Once per day on Sun Tue Thu Sat  . heparin  3,200 Units Intravenous Q M,W,F-HD  . heparin  5,000 Units Subcutaneous Q8H  . insulin aspart  0-9 Units Subcutaneous TID WC  . labetalol  200 mg Oral BID  . lidocaine  1 patch Transdermal Q24H  . lidocaine-prilocaine  1  application Topical UD  . mouth rinse  15 mL Mouth Rinse BID  . multivitamin  1 tablet Oral QHS    Dialysis Orders: [place] MWF 4 hrs 180NRe 400/800 79 kg 2.0 K/2.25 Ca UFP 4 TDC No heparin -No ESA. PATIENT IS A JEHOVAH WITNESS-DO NOT TRANSFUSE -No VDRA - Binder:Auryxia 210 mg 2 tabs PO TID AC  Assessment/Plan: 1. HCAP: Initially Azithromycin/Cefepime-> now Cefazolin,per primary. COVID 19 negative. 2. MSSA Bacteremia: BCx 1/6 positive - repeat BCx 1/7 negative. No vegetations on TTE. Plan is Cefazolin x 6 weeks with HD (thru 04/20/19). 3. ESRD: Continue HD per MWF schedule - next 03/14/19 (1st shift). Hopefully will be able to use AVF without issues on Monday. Mild hypokalemia today - will using higher K bath with HD. 4. Diarrhea: On admit - C.Diff negative. 5. Hypertension/volume: BP high, home meds resumed. Challenging down EDW - will be ~76kg. 6. Anemia: Hgb 8.8 - Aranesp 16mcg given 03/09/19, ^ next dose. + Jehovah's Witness. 7. Metabolic bone disease: CorrCa/Phos ok. No VDRA. Continue home binders Lorin Picket). 8. Nutrition: Alb very low - continue Nepro supplements, add pro-stat. 10.DM: Per primary.  Veneta Penton, PA-C 03/13/2019, 9:46 AM  [facility] Pager: [phone]

## 2019-03-13 NOTE — Plan of Care (Signed)
  Problem: Clinical Measurements: Goal: Ability to maintain clinical measurements within normal limits will improve Outcome: Progressing   

## 2019-03-13 NOTE — Progress Notes (Addendum)
   Subjective: HD#4   Overnight: No acute overnight events reported  Today, Bianca Myers reports that she feels "overwhelmed and weak.  "She states that she is worried about not regaining her strength back.  I made her aware that usually when people are admitted to the hospital, they begin to lose muscle mass which can cause weakness.  In addition, due to her recent bacteremia, it would take several days for her to regain strength and endurance.  I assured her that physical therapy will continue to work with her during this admission.  Objective:  Vital signs in last 24 hours: Vitals:   03/12/19 0800 03/12/19 1730 03/12/19 2121 03/13/19 0558  BP: (!) 150/78 (!) 148/77 (!) 148/80 (!) 145/80  Pulse: 66 64 61 60  Resp: 16 18 16 18   Temp: 98.6 F (37 C) 99.3 F (37.4 C) 98.5 F (36.9 C) 98.2 F (36.8 C)  TempSrc: Oral Oral    SpO2: 96% 100% 99% 97%  Weight:   77.4 kg   Height:       Const: In no apparent distress, lying comfortably in bed, conversational Resp: CTA BL, no wheezes, crackles, rhonchi CV: RRR, no murmurs, gallop, rub Abd: Bowel sounds present, nondistended, nontender to palpation   Assessment/Plan:  Principal Problem:   MSSA bacteremia Active Problems:   End-stage renal disease on hemodialysis (HCC)   Anemia of chronic kidney failure, stage 5 (HCC)   Chronic depression   Patient is Jehovah's Witness   Essential hypertension   HCAP (healthcare-associated pneumonia)   Diabetes mellitus ([place])   History of sarcoidosis   Mild aortic stenosis  Bianca Myers is a 74 yo F w/ a PMHx significant for ESRD on HD on MWF, HTN, diet controlled DM, a hx of hypoxia and dyspnea on exertion of unclear etiology on 3L O2 at home, anemia of chronic renal failure and chronic depression who presents with 10 days of shortness of breath, diarrhea, fatigue and anorexia found to have an elevated WBC, a chest x-ray concerning for multifocal pneumonia and MSSA bacteremia.  HCAP  (healthcare-associated pneumonia)   MSSA Bacteremia -Bianca Myers is a 74 yo F w/ a PMHx significant for ESRD on HD on MWF, a hx of hypoxia and dyspnea on exertion of unclear etiology on 3L O2 at home (worked up for HF and 06/20 echo with EV of 60-65% without evidence of high output failure), distant but currently inactive pulmonary sarcoidosis and anemia of chronic renal failure who presents with 10 days of shortness of breath, fatigue and anorexia found to have an elevated WBC, a chest x-ray concerning for multifocal pneumonia and MSSA bacteremia for which she is on ancef  -Echo without evidence of endocarditis, no TEE at this time per ID and patient -dialysis catheter removed -pt afebrile w/ resolved leukocytosis -can receive 6 weeks IV cefazolin with dialysis (through 04/20/2019)  Plan: -continue ancef (will discharge with 2g IV Ancef during HD MWF) -daily CBC -monitor fever curve -continue home O2   ESRD: -pt with ESRD on HD MWF  -Noted to have mild hyponatremia, hypokalemia and hyperphosphatemia on a.m. labs this morning.  Plan: -fu nephrology recs   HTN: -continue home amlodipine 5 mg daily, labetalol 200 mg BID and lasix per nephro   DM: -diet controlled DM -SSI while admitted   Dispo: Anticipated discharge pending clinical course.  Jean Rosenthal, MD 03/13/2019, 6:44 AM Pager: [phone] Internal Medicine Teaching Service

## 2019-03-14 LAB — BASIC METABOLIC PANEL WITH GFR
Anion gap: 16 — ABNORMAL HIGH (ref 5–15)
BUN: 62 mg/dL — ABNORMAL HIGH (ref 8–23)
CO2: 23 mmol/L (ref 22–32)
Calcium: 8.2 mg/dL — ABNORMAL LOW (ref 8.9–10.3)
Chloride: 92 mmol/L — ABNORMAL LOW (ref 98–111)
Creatinine, Ser: 11 mg/dL — ABNORMAL HIGH (ref 0.44–1.00)
GFR calc Af Amer: 4 mL/min — ABNORMAL LOW
GFR calc non Af Amer: 3 mL/min — ABNORMAL LOW
Glucose, Bld: 109 mg/dL — ABNORMAL HIGH (ref 70–99)
Potassium: 4 mmol/L (ref 3.5–5.1)
Sodium: 131 mmol/L — ABNORMAL LOW (ref 135–145)

## 2019-03-14 LAB — GLUCOSE, CAPILLARY
Glucose-Capillary: 103 mg/dL — ABNORMAL HIGH (ref 70–99)
Glucose-Capillary: 86 mg/dL (ref 70–99)
Glucose-Capillary: 78 mg/dL (ref 70–99)
Glucose-Capillary: 270 mg/dL — ABNORMAL HIGH (ref 70–99)

## 2019-03-14 LAB — CBC
HCT: 25.5 % — ABNORMAL LOW (ref 36.0–46.0)
Hemoglobin: 8.2 g/dL — ABNORMAL LOW (ref 12.0–15.0)
MCH: 29.1 pg (ref 26.0–34.0)
MCHC: 32.2 g/dL (ref 30.0–36.0)
MCV: 90.4 fL (ref 80.0–100.0)
Platelets: 319 10*3/uL (ref 150–400)
RBC: 2.82 MIL/uL — ABNORMAL LOW (ref 3.87–5.11)
RDW: 15.3 % (ref 11.5–15.5)
WBC: 9.8 10*3/uL (ref 4.0–10.5)
nRBC: 0 % (ref 0.0–0.2)

## 2019-03-14 MED ORDER — ALTEPLASE 2 MG IJ SOLR: 2.0000 mg | Freq: Once | INTRAMUSCULAR | Status: DC | PRN

## 2019-03-14 MED ORDER — CEFAZOLIN SODIUM-DEXTROSE 2-4 GM/100ML-% IV SOLN: 2.0000 g | INTRAVENOUS | 0 refills | Status: AC

## 2019-03-14 MED ORDER — DARBEPOETIN ALFA 60 MCG/0.3ML IJ SOSY: 60.0000 ug | PREFILLED_SYRINGE | INTRAMUSCULAR | Status: DC

## 2019-03-14 MED ORDER — HEPARIN SODIUM (PORCINE) 1000 UNIT/ML DIALYSIS: 1000.0000 [IU] | INTRAMUSCULAR | Status: DC | PRN

## 2019-03-14 MED ORDER — SODIUM CHLORIDE 0.9 % IV SOLN: 100.0000 mL | INTRAVENOUS | Status: DC | PRN

## 2019-03-14 NOTE — Progress Notes (Signed)
Subjective: HD#5   Overnight: No acute overnight events reported  Patient is very frustrated today as she has been NPO since last night and has no idea when her procedure will be. She says it is humiliating and maddening and she is starving. We assured her we would try to find out when her dialysis and procedure were scheduled and we got her a moist sponge for her dry mouth.  Objective:  Vital signs in last 24 hours: Vitals:   03/13/19 1630 03/13/19 2139 03/14/19 0507 03/14/19 0909  BP: (!) 157/77 (!) 148/69 (!) 146/71 (!) 149/81  Pulse: 66 62 63 62  Resp: 20 14  16   Temp: 98.2 F (36.8 C) 98.1 F (36.7 C) 98.7 F (37.1 C) 99 F (37.2 C)  TempSrc: Oral Oral Oral Oral  SpO2: 100% 96% 100% 98%  Weight:      Height:       Physical Exam  HENT:  Head: Normocephalic and atraumatic.  Cardiovascular: Normal rate, regular rhythm and normal heart sounds.  No murmur heard. Pulmonary/Chest: No respiratory distress.  Abdominal: Soft. Bowel sounds are normal. She exhibits no distension.  Musculoskeletal:        General: Normal range of motion.  Neurological: She is alert.  Skin: Skin is warm and dry. She is not diaphoretic.  Psychiatric:  frustrated  Nursing note and vitals reviewed.   Assessment/Plan:  Principal Problem:   MSSA bacteremia Active Problems:   End-stage renal disease on hemodialysis (HCC)   Anemia of chronic kidney failure, stage 5 (HCC)   Chronic depression   Patient is Jehovah's Witness   Essential hypertension   HCAP (healthcare-associated pneumonia)   Diabetes mellitus ([place])   History of sarcoidosis   Mild aortic stenosis  Bianca Myers is a 74 yo F w/ a PMHx significant for ESRD on HD on MWF, HTN, diet controlled DM, a hx of hypoxia and dyspnea on exertion of unclear etiology on 3L O2 at home, anemia of chronic renal failure and chronic depression who presents with 10 days of shortness of breath, diarrhea, fatigue and anorexia found to have an elevated  WBC, a chest x-ray concerning for multifocal pneumonia and MSSA bacteremia.  HCAP (healthcare-associated pneumonia)   MSSA Bacteremia -Bianca Myers is a 74 yo F w/ a PMHx significant for ESRD on HD on MWF, a hx of hypoxia and dyspnea on exertion of unclear etiology on 3L O2 at home (worked up for HF and 06/20 echo with EV of 60-65% without evidence of high output failure), distant but currently inactive pulmonary sarcoidosis and anemia of chronic renal failure who presents with 10 days of shortness of breath, fatigue and anorexia found to have an elevated WBC, a chest x-ray concerning for multifocal pneumonia and MSSA bacteremia for which she is on ancef  -Echo without evidence of endocarditis, no TEE at this time per ID and patient -dialysis catheter removed but may need to be replaced if fistula does not work today -pt afebrile w/ resolved leukocytosis -can receive 6 weeks IV cefazolin with dialysis (through 04/20/2019)  Plan: -continue ancef (will discharge with 2g IV Ancef during HD MWF) -daily CBC -monitor fever curve -continue home O2  ESRD: -pt with ESRD on HD MWF   Plan: -fu nephrology recs  HTN: -continue home amlodipine 5 mg daily, labetalol 200 mg BID and lasix per nephro  DM: -diet controlled DM -SSI while admitted   Dispo: Anticipated discharge pending clinical course.  Al Decant, MD 03/14/2019, 11:14 AM Pager:  [phone] Internal Medicine Teaching Service

## 2019-03-14 NOTE — Progress Notes (Signed)
Pharmacy Antibiotic Note  Bianca Myers is a 74 y.o. female admitted on 03/09/2019 with MSSA bacteremia.  Pharmacy has been consulted for Cefazolin dosing.  The patient is ESRD-MWF. ID has been consulted and planning to treat for 6 weeks thru 04/20/19. Currently on daily dosing but can transition to 2g/HD upon discharge.   Plan: - Continue Cefazolin 1g IV every 24 hours - Will continue to follow HD schedule/duration  Height: 5' 6.5" (168.9 cm) Weight: 170 lb 10.6 oz (77.4 kg) IBW/kg (Calculated) : 60.45  Temp (24hrs), Avg:98.4 F (36.9 C), Min:98 F (36.7 C), Max:99 F (37.2 C)  Recent Labs  Lab 03/09/19 0848 03/09/19 1048 03/09/19 1453 03/10/19 0358 03/11/19 0708 03/12/19 0537 03/12/19 0557 03/13/19 0212 03/13/19 0753 03/14/19 0410  WBC  --   --   --  19.7* 12.2* 8.3  --   --  8.2 9.8  CREATININE   < >  --   --  5.42* 7.94*  --  6.68* 8.49*  --  11.00*  LATICACIDVEN  --  1.1 1.2  --   --   --   --   --   --   --    < > = values in this interval not displayed.    Estimated Creatinine Clearance: 4.8 mL/min (A) (by C-G formula based on SCr of 11 mg/dL (H)).    No Known Allergies  Azithro 1/6 x 1 Cefepime 1/6 x 1 CTX 1/6 x 1 Cefazolin 1/7 >>  1/6 BCx >> 4/4 MSSA  Thank you for allowing pharmacy to be a part of this patient's care.  Alycia Rossetti, PharmD, BCPS Clinical Pharmacist Clinical phone for 03/14/2019: [phone] 03/14/2019 10:11 AM   **Pharmacist phone directory can now be found on [email or web address] ([email or web address]).  Listed under [place].

## 2019-03-14 NOTE — Care Management Important Message (Signed)
Important Message  Patient Details  Name: Bianca Myers MRN: [id number] Date of Birth: September 08, 1945   Medicare Important Message Given:  Yes     Yeng Frankie Montine Circle 03/14/2019, 2:59 PM

## 2019-03-14 NOTE — Progress Notes (Signed)
Pt has been NPO since midnight. CHG wipes were also done this am.   Eleanora Neighbor, RN

## 2019-03-14 NOTE — Progress Notes (Signed)
PT Cancellation Note  Patient Details Name: Bianca Myers MRN: [id number] DOB: September 14, 1945   Cancelled Treatment:    Reason Eval/Treat Not Completed: Fatigue/lethargy limiting ability to participate.  Will try again as time and pt allow, as she is heading to a procedure now and will be available later.   Ramond Dial 03/14/2019, 12:02 PM   Mee Hives, PT MS Acute Rehab Dept. Number: [place] [facility] [place]

## 2019-03-14 NOTE — Progress Notes (Addendum)
[facility] Progress Note   Subjective: Awake, pleasant, no new complaints.     Objective Vitals:   03/13/19 1630 03/13/19 2139 03/14/19 0507 03/14/19 0909  BP: (!) 157/77 (!) 148/69 (!) 146/71 (!) 149/81  Pulse: 66 62 63 62  Resp: 20 14  16   Temp: 98.2 F (36.8 C) 98.1 F (36.7 C) 98.7 F (37.1 C) 99 F (37.2 C)  TempSrc: Oral Oral Oral Oral  SpO2: 100% 96% 100% 98%  Weight:      Height:       Physical Exam General: Pleasant elderly female in NAD Heart: S1,S2 No M/R/G. .  Lungs: CTAB A/P Abdomen: S, NT Extremities: No LE edema Dialysis Access: RIJ TDC has been removed. drsg intact. L AVF + bruit   Additional Objective Labs: Basic Metabolic Panel: Recent Labs  Lab 03/11/19 0708 03/12/19 0557 03/13/19 0212 03/14/19 0410  NA 131* 130* 129* 131*  K 3.2* 3.3* 3.2* 4.0  CL 91* 89* 91* 92*  CO2 25 28 27 23   GLUCOSE 121* 121* 113* 109*  BUN 49* 36* 46* 62*  CREATININE 7.94* 6.68* 8.49* 11.00*  CALCIUM 8.1* 8.2* 8.1* 8.2*  PHOS 5.4* 5.0* 5.3*  --    Liver Function Tests: Recent Labs  Lab 03/09/19 1037 03/11/19 0708 03/12/19 0557 03/13/19 0212  AST 15  --   --   --   ALT 19  --   --   --   ALKPHOS 75  --   --   --   BILITOT 0.4  --   --   --   PROT 7.2  --   --   --   ALBUMIN 2.1* 1.8* 1.9* 1.8*   Recent Labs  Lab 03/09/19 1037  LIPASE 15   CBC: Recent Labs  Lab 03/10/19 0358 03/11/19 0708 03/12/19 0537 03/13/19 0753 03/14/19 0410  WBC 19.7* 12.2* 8.3 8.2 9.8  HGB 8.8* 8.4* 8.9* 8.8* 8.2*  HCT 25.9* 26.2* 27.4* 27.0* 25.5*  MCV 89.0 91.6 90.7 90.3 90.4  PLT 229 261 283 298 319   Blood Culture    Component Value Date/Time   SDES BLOOD RIGHT HAND 03/10/2019 1107   SPECREQUEST AEROBIC BOTTLE ONLY Blood Culture adequate volume 03/10/2019 1107   CULT  03/10/2019 1107    NO GROWTH 4 DAYS Performed at [facility], [place] [address], [place], [place] [address]    REPTSTATUS PENDING 03/10/2019 1107    Cardiac  Enzymes: No results for input(s): CKTOTAL, CKMB, CKMBINDEX, TROPONINI in the last 168 hours. CBG: Recent Labs  Lab 03/13/19 1148 03/13/19 1627 03/13/19 2141 03/14/19 0653 03/14/19 1124  GLUCAP 117* 103* 159* 103* 86   Iron Studies: No results for input(s): IRON, TIBC, TRANSFERRIN, FERRITIN in the last 72 hours. @lablastinr3 @ Studies/Results: No results found. Medications: . sodium chloride 250 mL (03/10/19 0610)  .  ceFAZolin (ANCEF) IV 1 g (03/13/19 2037)   . amLODipine  5 mg Oral QHS  . atorvastatin  10 mg Oral QHS  . Chlorhexidine Gluconate Cloth  6 each Topical Q0600  . darbepoetin (ARANESP) injection - DIALYSIS  25 mcg Intravenous Q Wed-HD  . feeding supplement (NEPRO CARB STEADY)  237 mL Oral TID BM  . feeding supplement (PRO-STAT SUGAR FREE 64)  30 mL Oral BID  . ferric citrate  420 mg Oral TID WC  . furosemide  80 mg Oral Once per day on Sun Tue Thu Sat  . heparin  3,200 Units Intravenous Q M,W,F-HD  .  heparin  5,000 Units Subcutaneous Q8H  . insulin aspart  0-9 Units Subcutaneous TID WC  . labetalol  200 mg Oral BID  . lidocaine  1 patch Transdermal Q24H  . lidocaine-prilocaine  1 application Topical UD  . mouth rinse  15 mL Mouth Rinse BID  . multivitamin  1 tablet Oral QHS     Dialysis Orders: [place] MWF 4 hrs 180NRe 400/800 79 kg 2.0 K/2.25 Ca UFP 4 TDC No heparin -No ESA. PATIENT IS A JEHOVAH WITNESS-DO NOT TRANSFUSE -No VDRA - Binder:Auryxia 210 mg 2 tabs PO TID AC  Assessment/Plan: 1. HCAP: Initially Azithromycin/Cefepime-> now Cefazolin,per primary. COVID 19 negative. 2. MSSA Bacteremia: BCx 1/6 positive - repeat BCx 1/7 negative. No vegetations on TTE.Plan is Cefazolin x 6 weeks with HD (thru 04/20/19). If able to cannulate AVF today will not need TDC.  3. ESRD: Continue HD per MWF schedule-Hopefully will be able to use AVF without issues on today. K+4.0 today. No heparin.  4. Diarrhea: On admit - C.Diff  negative. 5. Hypertension/volume: BP high, home meds resumed. Challenging down EDW - will be ~76kg. 6. Anemia: Hgb 8.2- Aranesp 54mcg given 03/09/19, ^ next dose. + Jehovah's Witness. 7. Metabolic bone disease: CorrCa/Phos ok. No VDRA. Continue home binders Lorin Picket). 8. Nutrition: Alb very low - continue Nepro supplements, add pro-stat. 10.DM: Per primary.  Rita H. Brown NP-C 03/14/2019, 11:59 AM  [facility] [phone]  Pt seen, examined and agree w A/P as above.  Kelly Splinter  MD 03/14/2019, 2:50 PM

## 2019-03-14 NOTE — Plan of Care (Signed)
  Problem: Activity: Goal: Risk for activity intolerance will decrease Outcome: Progressing   

## 2019-03-15 LAB — CULTURE, BLOOD (ROUTINE X 2)
Culture: NO GROWTH
Culture: NO GROWTH
Special Requests: ADEQUATE
Special Requests: ADEQUATE

## 2019-03-15 LAB — CBC
HCT: 28.3 % — ABNORMAL LOW (ref 36.0–46.0)
Hemoglobin: 9.1 g/dL — ABNORMAL LOW (ref 12.0–15.0)
MCH: 29.5 pg (ref 26.0–34.0)
MCHC: 32.2 g/dL (ref 30.0–36.0)
MCV: 91.9 fL (ref 80.0–100.0)
Platelets: 350 10*3/uL (ref 150–400)
RBC: 3.08 MIL/uL — ABNORMAL LOW (ref 3.87–5.11)
RDW: 15.4 % (ref 11.5–15.5)
WBC: 9.2 10*3/uL (ref 4.0–10.5)
nRBC: 0 % (ref 0.0–0.2)

## 2019-03-15 LAB — BASIC METABOLIC PANEL
Anion gap: 14 (ref 5–15)
BUN: 47 mg/dL — ABNORMAL HIGH (ref 8–23)
CO2: 25 mmol/L (ref 22–32)
Calcium: 8.4 mg/dL — ABNORMAL LOW (ref 8.9–10.3)
Chloride: 94 mmol/L — ABNORMAL LOW (ref 98–111)
Creatinine, Ser: 8.42 mg/dL — ABNORMAL HIGH (ref 0.44–1.00)
GFR calc Af Amer: 5 mL/min — ABNORMAL LOW (ref >60)
GFR calc non Af Amer: 4 mL/min — ABNORMAL LOW (ref >60)
Glucose, Bld: 112 mg/dL — ABNORMAL HIGH (ref 70–99)
Potassium: 3.8 mmol/L (ref 3.5–5.1)
Sodium: 133 mmol/L — ABNORMAL LOW (ref 135–145)

## 2019-03-15 LAB — GLUCOSE, CAPILLARY
Glucose-Capillary: 102 mg/dL — ABNORMAL HIGH (ref 70–99)
Glucose-Capillary: 131 mg/dL — ABNORMAL HIGH (ref 70–99)

## 2019-03-15 SURGERY — ECHOCARDIOGRAM, TRANSESOPHAGEAL
Anesthesia: Moderate Sedation

## 2019-03-15 NOTE — Progress Notes (Signed)
Patient ID: Bianca Myers, female   DOB: 1945-12-15, 74 y.o.   MRN: [id number]   IR had a request for Tunn Cath if AVF unsuccessful dialysis yesterday  RN confirms successful dialysis with AVF yesterday Completed dialysis  Will cancel tunneled catheter Call or re order catheter if needed

## 2019-03-15 NOTE — Progress Notes (Signed)
   Subjective: HD#6   Overnight: No acute overnight events reported  Patient seen at bedside on rounds today. She slept well last night and is looking forward to going home today. She has no complaints or concerns. We discussed with her that her energy would slowly return.   Objective:  Vital signs in last 24 hours: Vitals:   03/14/19 1530 03/14/19 1613 03/14/19 2123 03/15/19 0452  BP: (!) 170/100 (!) 159/84 (!) 143/71 137/74  Pulse: 81 91 68 66  Resp: 17 16 18 18   Temp: 98.6 F (37 C) 98.5 F (36.9 C) 98.7 F (37.1 C) 98.9 F (37.2 C)  TempSrc: Oral Oral Oral Oral  SpO2:  95% 99% 96%  Weight: 76 kg  77.3 kg   Height:       Physical Exam  HENT:  Head: Normocephalic and atraumatic.  Cardiovascular: Normal rate, regular rhythm and normal heart sounds.  No murmur heard. Pulmonary/Chest: No respiratory distress.  Abdominal: Soft. She exhibits no distension.  Musculoskeletal:        General: Normal range of motion.  Neurological: She is alert.  Skin: Skin is warm and dry. She is not diaphoretic.  Nursing note and vitals reviewed.  Assessment/Plan:  Principal Problem:   MSSA bacteremia Active Problems:   End-stage renal disease on hemodialysis (HCC)   Anemia of chronic kidney failure, stage 5 (HCC)   Chronic depression   Patient is Jehovah's Witness   Essential hypertension   HCAP (healthcare-associated pneumonia)   Diabetes mellitus ([place])   History of sarcoidosis   Mild aortic stenosis  Bianca Myers is a 74 yo F w/ a PMHx significant for ESRD on HD on MWF, HTN, diet controlled DM, a hx of hypoxia and dyspnea on exertion of unclear etiology on 3L O2 at home, anemia of chronic renal failure and chronic depression who presents with 10 days of shortness of breath, diarrhea, fatigue and anorexia found to have an elevated WBC, a chest x-ray concerning for multifocal pneumonia and MSSA bacteremia.  HCAP (healthcare-associated pneumonia) MSSA Bacteremia -Bianca Myers is a  74 yo F w/ a PMHx significant for ESRD on HD on MWF, a hx of hypoxia and dyspnea on exertion of unclear etiology on 3L O2 at home (worked up for HF and 06/20 echo with EV of 60-65% without evidence of high output failure), distant but currently inactive pulmonary sarcoidosis and anemia of chronic renal failure who presents with 10 days of shortness of breath, fatigue and anorexia found to have an elevated WBC, a chest x-ray concerning for multifocal pneumonia and MSSA bacteremia for which she is on ancef  -Echo without evidence of endocarditis, no TEE at this time per ID and patient -dialysis catheter removed; fistula being used for dialysis  -pt afebrile w/ resolved leukocytosis -can receive 6 weeks IV cefazolin with dialysis (through 04/20/2019)  Plan: -continue ancef (will discharge with 2g IV Ancef during HD MWF)  ESRD: -pt with ESRD on HD MWF   Plan: -fu nephrology recs  HTN: -continue home amlodipine 5 mg daily, labetalol 200 mg BID and lasix per nephro  DM: -diet controlled DM -SSI while admitted  Dispo: Anticipated discharge pending clinical course.  Al Decant, MD 03/15/2019, 8:14 AM Pager: [phone] Internal Medicine Teaching Service

## 2019-03-15 NOTE — Progress Notes (Signed)
Noted discharge order. Patient already discharged. Has orders for [facility] and aide. LVM on patient cell requesting call back.   Manya Silvas, RN MSN CCM Transitions of Care [phone]

## 2019-03-15 NOTE — Progress Notes (Addendum)
[facility] Progress Note   Subjective: Going home today. Concerned about staff being able cannulate AVF successfully. I have discussed with staff at [facility] and believe this will be successful.    Objective Vitals:   03/14/19 1613 03/14/19 2123 03/15/19 0452 03/15/19 1007  BP: (!) 159/84 (!) 143/71 137/74 (!) 152/75  Pulse: 91 68 66 63  Resp: 16 18 18 16   Temp: 98.5 F (36.9 C) 98.7 F (37.1 C) 98.9 F (37.2 C) 98.6 F (37 C)  TempSrc: Oral Oral Oral Oral  SpO2: 95% 99% 96% 93%  Weight:  77.3 kg    Height:       Physical Exam General:Pleasant elderly female in NAD Heart:S1,S2 No M/R/G. . Lungs:CTAB A/P Abdomen:S, NT Extremities:No LE edema Dialysis Access:L AVF + bruit    Additional Objective Labs: Basic Metabolic Panel: Recent Labs  Lab 03/11/19 0708 03/12/19 0557 03/13/19 0212 03/14/19 0410 03/15/19 0629  NA 131* 130* 129* 131* 133*  K 3.2* 3.3* 3.2* 4.0 3.8  CL 91* 89* 91* 92* 94*  CO2 25 28 27 23 25   GLUCOSE 121* 121* 113* 109* 112*  BUN 49* 36* 46* 62* 47*  CREATININE 7.94* 6.68* 8.49* 11.00* 8.42*  CALCIUM 8.1* 8.2* 8.1* 8.2* 8.4*  PHOS 5.4* 5.0* 5.3*  --   --    Liver Function Tests: Recent Labs  Lab 03/09/19 1037 03/11/19 0708 03/12/19 0557 03/13/19 0212  AST 15  --   --   --   ALT 19  --   --   --   ALKPHOS 75  --   --   --   BILITOT 0.4  --   --   --   PROT 7.2  --   --   --   ALBUMIN 2.1* 1.8* 1.9* 1.8*   Recent Labs  Lab 03/09/19 1037  LIPASE 15   CBC: Recent Labs  Lab 03/11/19 0708 03/12/19 0537 03/13/19 0753 03/14/19 0410 03/15/19 0629  WBC 12.2* 8.3 8.2 9.8 9.2  HGB 8.4* 8.9* 8.8* 8.2* 9.1*  HCT 26.2* 27.4* 27.0* 25.5* 28.3*  MCV 91.6 90.7 90.3 90.4 91.9  PLT 261 283 298 319 350   Blood Culture    Component Value Date/Time   SDES BLOOD RIGHT HAND 03/10/2019 1107   SPECREQUEST AEROBIC BOTTLE ONLY Blood Culture adequate volume 03/10/2019 1107   CULT  03/10/2019 1107    NO  GROWTH 4 DAYS Performed at [facility], [place] [address], [place], [place] [address]    REPTSTATUS PENDING 03/10/2019 1107    Cardiac Enzymes: No results for input(s): CKTOTAL, CKMB, CKMBINDEX, TROPONINI in the last 168 hours. CBG: Recent Labs  Lab 03/14/19 0653 03/14/19 1124 03/14/19 1612 03/14/19 2124 03/15/19 0653  GLUCAP 103* 86 78 270* 102*   Iron Studies: No results for input(s): IRON, TIBC, TRANSFERRIN, FERRITIN in the last 72 hours. @lablastinr3 @ Studies/Results: No results found. Medications: . sodium chloride 250 mL (03/10/19 0610)  .  ceFAZolin (ANCEF) IV 1 g (03/14/19 2228)   . amLODipine  5 mg Oral QHS  . atorvastatin  10 mg Oral QHS  . Chlorhexidine Gluconate Cloth  6 each Topical Q0600  . [START ON 03/16/2019] darbepoetin (ARANESP) injection - DIALYSIS  60 mcg Intravenous Q Wed-HD  . feeding supplement (NEPRO CARB STEADY)  237 mL Oral TID BM  . feeding supplement (PRO-STAT SUGAR FREE 64)  30 mL Oral BID  . ferric citrate  420 mg Oral TID WC  . furosemide  80 mg Oral Once per day on Sun Tue Thu Sat  . heparin  5,000 Units Subcutaneous Q8H  . insulin aspart  0-9 Units Subcutaneous TID WC  . labetalol  200 mg Oral BID  . lidocaine  1 patch Transdermal Q24H  . lidocaine-prilocaine  1 application Topical UD  . mouth rinse  15 mL Mouth Rinse BID  . multivitamin  1 tablet Oral QHS     Dialysis Orders: [place] MWF 4 hrs 180NRe 400/800 79 kg 2.0 K/2.25 Ca UFP 4 TDC No heparin -No ESA. PATIENT IS A JEHOVAH WITNESS-DO NOT TRANSFUSE -No VDRA - Binder:Auryxia 210 mg 2 tabs PO TID AC  Assessment/Plan: 1. HCAP: Initially Azithromycin/Cefepime-> now Cefazolin,per primary. COVID 19 negative. 2. MSSA Bacteremia: BCx 1/6 positive - repeat BCx 1/7 negative.No vegetations on TTE.Plan is Cefazolin x 6 weeks with HD (thru 04/20/19).  3. ESRD: Continue HD per MWF schedule-Was able to use AVF without issues 03/14/19. Discussed with [facility].  Will use new AVF cannulation protocol and expert cannulator.  4. Diarrhea: On admit - C.Diff negative. 5. Hypertension/volume: BP better controlled. HD 01/11 Net UF 2.0 liters Now under EDW-lower to 76 kg.  6. Anemia: Hgb 9.1- Aranesp 19mcg given 03/09/19, ^ next dose. + Jehovah's Witness. 7. Metabolic bone disease: CorrCa/Phosok. No VDRA. Continue home binders Lorin Picket). 8. Nutrition: Alb very low - continue Nepro supplements, add pro-stat. 10.DM: Per primary.  Rita H. Brown NP-C 03/15/2019, 11:03 AM  [employer] [phone]

## 2019-03-16 ENCOUNTER — Telehealth: Payer: Self-pay | Admitting: Physician Assistant

## 2019-03-16 NOTE — Telephone Encounter (Signed)
Transition of Care Contact from [place]  Date of Discharge: 03/15/2019 Date of Contact: 03/16/2019 Method of contact: phone  Attempted to contact patient to discuss transition of care from inpatient admission due to HCAP. Patient did not answer the phone, left voicemail.  Will attempt to call her again or follow up at dialysis.   Anice Paganini, PA-C 03/16/2019, 2:05 PM  [facility] Pager: [phone]

## 2019-03-17 ENCOUNTER — Telehealth: Payer: Self-pay | Admitting: Nephrology

## 2019-03-17 NOTE — Telephone Encounter (Signed)
Transition of care contact from inpatient facility  Date of Discharge: 03/15/2019  Date of Contact: 03/17/2019 - attempted Method of contact: Phone  Attempted to contact patient to discuss transition of carefrom inpatient admission. Patient did not answer the phone. Message was left on the patient's voicemail with call back number [phone].  Veneta Penton, PA-C [employer] Pager [phone]

## 2019-06-07 ENCOUNTER — Emergency Department (HOSPITAL_COMMUNITY): Payer: Medicare HMO

## 2019-06-07 ENCOUNTER — Other Ambulatory Visit: Payer: Self-pay

## 2019-06-07 ENCOUNTER — Inpatient Hospital Stay (HOSPITAL_COMMUNITY)
Admission: EM | Admit: 2019-06-07 | Discharge: 2019-06-16 | DRG: 956 | Disposition: A | Discharge status: Skilled Nursing Facility | Payer: Medicare HMO | Attending: Internal Medicine | Admitting: Internal Medicine

## 2019-06-07 DIAGNOSIS — Z992 Dependence on renal dialysis: Secondary | ICD-10-CM | POA: Diagnosis not present

## 2019-06-07 DIAGNOSIS — E1122 Type 2 diabetes mellitus with diabetic chronic kidney disease: Secondary | ICD-10-CM | POA: Diagnosis present

## 2019-06-07 DIAGNOSIS — W1839XA Other fall on same level, initial encounter: Secondary | ICD-10-CM | POA: Diagnosis present

## 2019-06-07 DIAGNOSIS — S2241XA Multiple fractures of ribs, right side, initial encounter for closed fracture: Secondary | ICD-10-CM | POA: Diagnosis present

## 2019-06-07 DIAGNOSIS — S72002A Fracture of unspecified part of neck of left femur, initial encounter for closed fracture: Secondary | ICD-10-CM | POA: Diagnosis not present

## 2019-06-07 DIAGNOSIS — Z87891 Personal history of nicotine dependence: Secondary | ICD-10-CM

## 2019-06-07 DIAGNOSIS — N19 Unspecified kidney failure: Secondary | ICD-10-CM | POA: Diagnosis not present

## 2019-06-07 DIAGNOSIS — K5900 Constipation, unspecified: Secondary | ICD-10-CM | POA: Diagnosis present

## 2019-06-07 DIAGNOSIS — R4182 Altered mental status, unspecified: Secondary | ICD-10-CM | POA: Diagnosis not present

## 2019-06-07 DIAGNOSIS — G253 Myoclonus: Secondary | ICD-10-CM | POA: Diagnosis not present

## 2019-06-07 DIAGNOSIS — Z8619 Personal history of other infectious and parasitic diseases: Secondary | ICD-10-CM | POA: Diagnosis not present

## 2019-06-07 DIAGNOSIS — G9341 Metabolic encephalopathy: Secondary | ICD-10-CM | POA: Diagnosis present

## 2019-06-07 DIAGNOSIS — R55 Syncope and collapse: Secondary | ICD-10-CM | POA: Diagnosis present

## 2019-06-07 DIAGNOSIS — E875 Hyperkalemia: Secondary | ICD-10-CM | POA: Diagnosis present

## 2019-06-07 DIAGNOSIS — N186 End stage renal disease: Secondary | ICD-10-CM | POA: Diagnosis present

## 2019-06-07 DIAGNOSIS — T82510A Breakdown (mechanical) of surgically created arteriovenous fistula, initial encounter: Secondary | ICD-10-CM | POA: Diagnosis present

## 2019-06-07 DIAGNOSIS — D62 Acute posthemorrhagic anemia: Secondary | ICD-10-CM | POA: Diagnosis not present

## 2019-06-07 DIAGNOSIS — Z9181 History of falling: Secondary | ICD-10-CM

## 2019-06-07 DIAGNOSIS — T82590A Other mechanical complication of surgically created arteriovenous fistula, initial encounter: Secondary | ICD-10-CM | POA: Diagnosis not present

## 2019-06-07 DIAGNOSIS — E8889 Other specified metabolic disorders: Secondary | ICD-10-CM | POA: Diagnosis present

## 2019-06-07 DIAGNOSIS — S72032A Displaced midcervical fracture of left femur, initial encounter for closed fracture: Principal | ICD-10-CM | POA: Diagnosis present

## 2019-06-07 DIAGNOSIS — R112 Nausea with vomiting, unspecified: Secondary | ICD-10-CM | POA: Diagnosis present

## 2019-06-07 DIAGNOSIS — Y92009 Unspecified place in unspecified non-institutional (private) residence as the place of occurrence of the external cause: Secondary | ICD-10-CM | POA: Diagnosis not present

## 2019-06-07 DIAGNOSIS — Y832 Surgical operation with anastomosis, bypass or graft as the cause of abnormal reaction of the patient, or of later complication, without mention of misadventure at the time of the procedure: Secondary | ICD-10-CM | POA: Diagnosis present

## 2019-06-07 DIAGNOSIS — R7989 Other specified abnormal findings of blood chemistry: Secondary | ICD-10-CM | POA: Diagnosis present

## 2019-06-07 DIAGNOSIS — I120 Hypertensive chronic kidney disease with stage 5 chronic kidney disease or end stage renal disease: Secondary | ICD-10-CM | POA: Diagnosis present

## 2019-06-07 DIAGNOSIS — Z419 Encounter for procedure for purposes other than remedying health state, unspecified: Secondary | ICD-10-CM

## 2019-06-07 DIAGNOSIS — Y92230 Patient room in hospital as the place of occurrence of the external cause: Secondary | ICD-10-CM | POA: Diagnosis not present

## 2019-06-07 DIAGNOSIS — Z20822 Contact with and (suspected) exposure to covid-19: Secondary | ICD-10-CM | POA: Diagnosis present

## 2019-06-07 DIAGNOSIS — S32591A Other specified fracture of right pubis, initial encounter for closed fracture: Secondary | ICD-10-CM | POA: Diagnosis not present

## 2019-06-07 DIAGNOSIS — T148XXA Other injury of unspecified body region, initial encounter: Secondary | ICD-10-CM

## 2019-06-07 DIAGNOSIS — I10 Essential (primary) hypertension: Secondary | ICD-10-CM | POA: Diagnosis not present

## 2019-06-07 DIAGNOSIS — W19XXXA Unspecified fall, initial encounter: Secondary | ICD-10-CM | POA: Diagnosis not present

## 2019-06-07 DIAGNOSIS — Z96642 Presence of left artificial hip joint: Secondary | ICD-10-CM

## 2019-06-07 DIAGNOSIS — Z79899 Other long term (current) drug therapy: Secondary | ICD-10-CM

## 2019-06-07 DIAGNOSIS — T82858A Stenosis of vascular prosthetic devices, implants and grafts, initial encounter: Secondary | ICD-10-CM | POA: Diagnosis present

## 2019-06-07 DIAGNOSIS — T82898A Other specified complication of vascular prosthetic devices, implants and grafts, initial encounter: Secondary | ICD-10-CM | POA: Diagnosis not present

## 2019-06-07 LAB — RESPIRATORY PANEL BY RT PCR (FLU A&B, COVID)
Influenza A by PCR: NEGATIVE
Influenza B by PCR: NEGATIVE
SARS Coronavirus 2 by RT PCR: NEGATIVE

## 2019-06-07 LAB — CBC WITH DIFFERENTIAL/PLATELET
Abs Immature Granulocytes: 0.14 10*3/uL — ABNORMAL HIGH (ref 0.00–0.07)
Basophils Absolute: 0 10*3/uL (ref 0.0–0.1)
Basophils Relative: 0 %
Eosinophils Absolute: 0 10*3/uL (ref 0.0–0.5)
Eosinophils Relative: 0 %
HCT: 40.8 % (ref 36.0–46.0)
Hemoglobin: 12.5 g/dL (ref 12.0–15.0)
Immature Granulocytes: 1 %
Lymphocytes Relative: 6 %
Lymphs Abs: 1 10*3/uL (ref 0.7–4.0)
MCH: 31.7 pg (ref 26.0–34.0)
MCHC: 30.6 g/dL (ref 30.0–36.0)
MCV: 103.6 fL — ABNORMAL HIGH (ref 80.0–100.0)
Monocytes Absolute: 0.6 10*3/uL (ref 0.1–1.0)
Monocytes Relative: 4 %
Neutro Abs: 14.3 10*3/uL — ABNORMAL HIGH (ref 1.7–7.7)
Neutrophils Relative %: 89 %
Platelets: 286 10*3/uL (ref 150–400)
RBC: 3.94 MIL/uL (ref 3.87–5.11)
RDW: 18.4 % — ABNORMAL HIGH (ref 11.5–15.5)
WBC: 16.1 10*3/uL — ABNORMAL HIGH (ref 4.0–10.5)
nRBC: 0.7 % — ABNORMAL HIGH (ref 0.0–0.2)

## 2019-06-07 LAB — COMPREHENSIVE METABOLIC PANEL
ALT: <5 U/L (ref 0–44)
AST: 29 U/L (ref 15–41)
Albumin: 3.4 g/dL — ABNORMAL LOW (ref 3.5–5.0)
Alkaline Phosphatase: 84 U/L (ref 38–126)
Anion gap: 24 — ABNORMAL HIGH (ref 5–15)
BUN: 100 mg/dL — ABNORMAL HIGH (ref 8–23)
CO2: 14 mmol/L — ABNORMAL LOW (ref 22–32)
Calcium: 9.2 mg/dL (ref 8.9–10.3)
Chloride: 96 mmol/L — ABNORMAL LOW (ref 98–111)
Creatinine, Ser: 13.1 mg/dL — ABNORMAL HIGH (ref 0.44–1.00)
GFR calc Af Amer: 3 mL/min — ABNORMAL LOW (ref >60)
GFR calc non Af Amer: 2 mL/min — ABNORMAL LOW (ref >60)
Glucose, Bld: 129 mg/dL — ABNORMAL HIGH (ref 70–99)
Potassium: 6.9 mmol/L (ref 3.5–5.1)
Sodium: 134 mmol/L — ABNORMAL LOW (ref 135–145)
Total Bilirubin: 0.9 mg/dL (ref 0.3–1.2)
Total Protein: 7.9 g/dL (ref 6.5–8.1)

## 2019-06-07 LAB — LACTIC ACID, PLASMA
Lactic Acid, Venous: 1.2 mmol/L (ref 0.5–1.9)
Lactic Acid, Venous: 1.2 mmol/L (ref 0.5–1.9)

## 2019-06-07 LAB — LIPASE, BLOOD: Lipase: 29 U/L (ref 11–51)

## 2019-06-07 LAB — TROPONIN I (HIGH SENSITIVITY)
Troponin I (High Sensitivity): 56 ng/L — ABNORMAL HIGH (ref <18)
Troponin I (High Sensitivity): 72 ng/L — ABNORMAL HIGH (ref <18)

## 2019-06-07 LAB — MAGNESIUM: Magnesium: 2.2 mg/dL (ref 1.7–2.4)

## 2019-06-07 IMAGING — DX DG FEMUR 2+V*L*
4 series · 4 of 4 positions shown · non-contrast
Comparison: None.

CLINICAL DATA: Syncope, fall, left hip pain

EXAM:
PELVIS - 1-2 VIEW; LEFT FEMUR 2 VIEWS

[femur ap (1 of 2)]
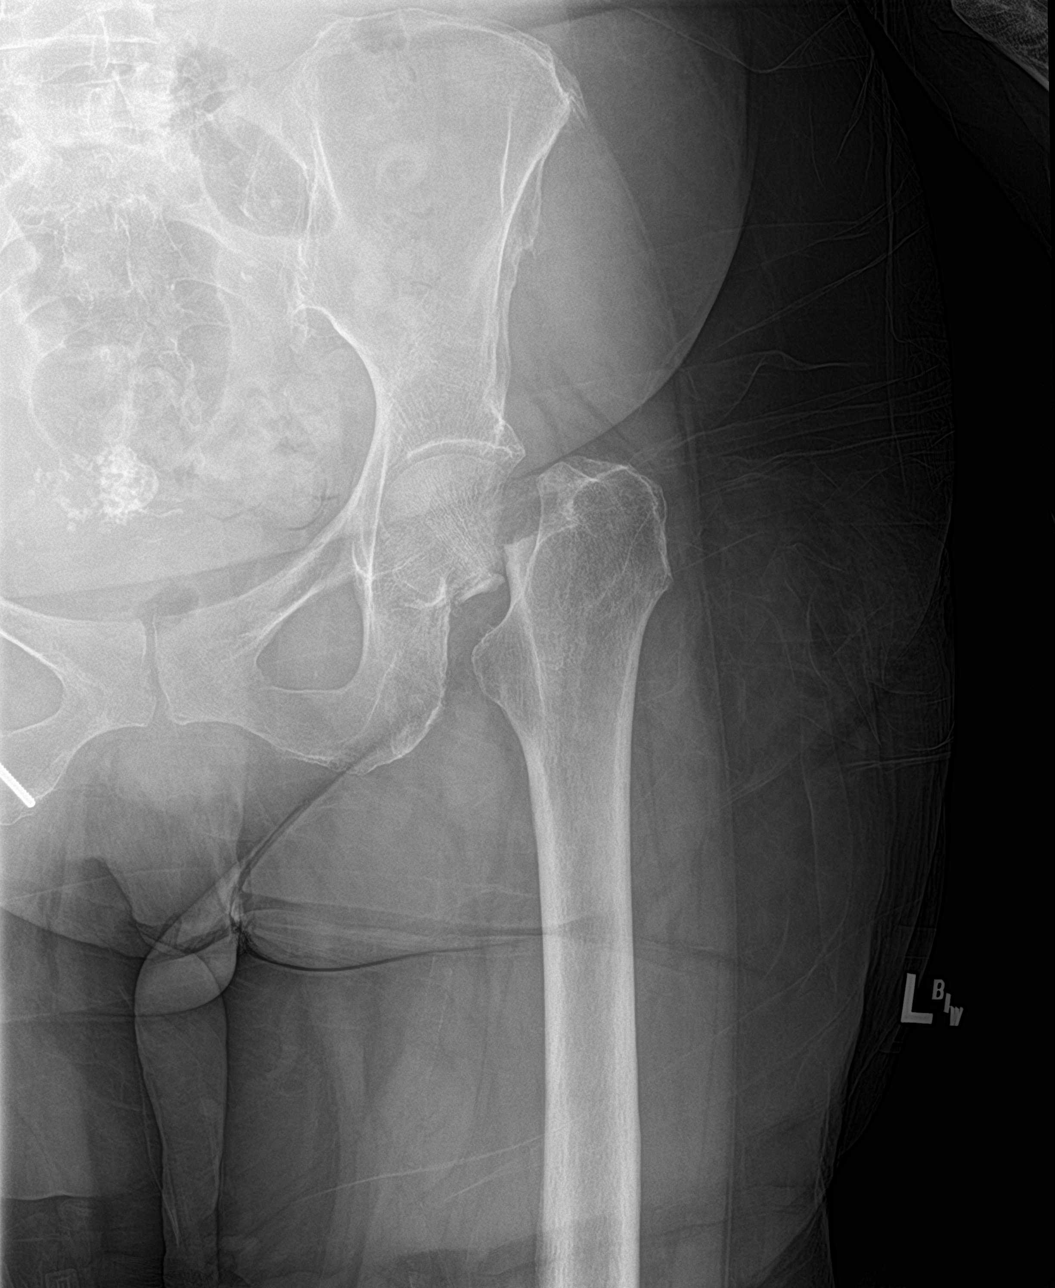

[femur ap (2 of 2)]
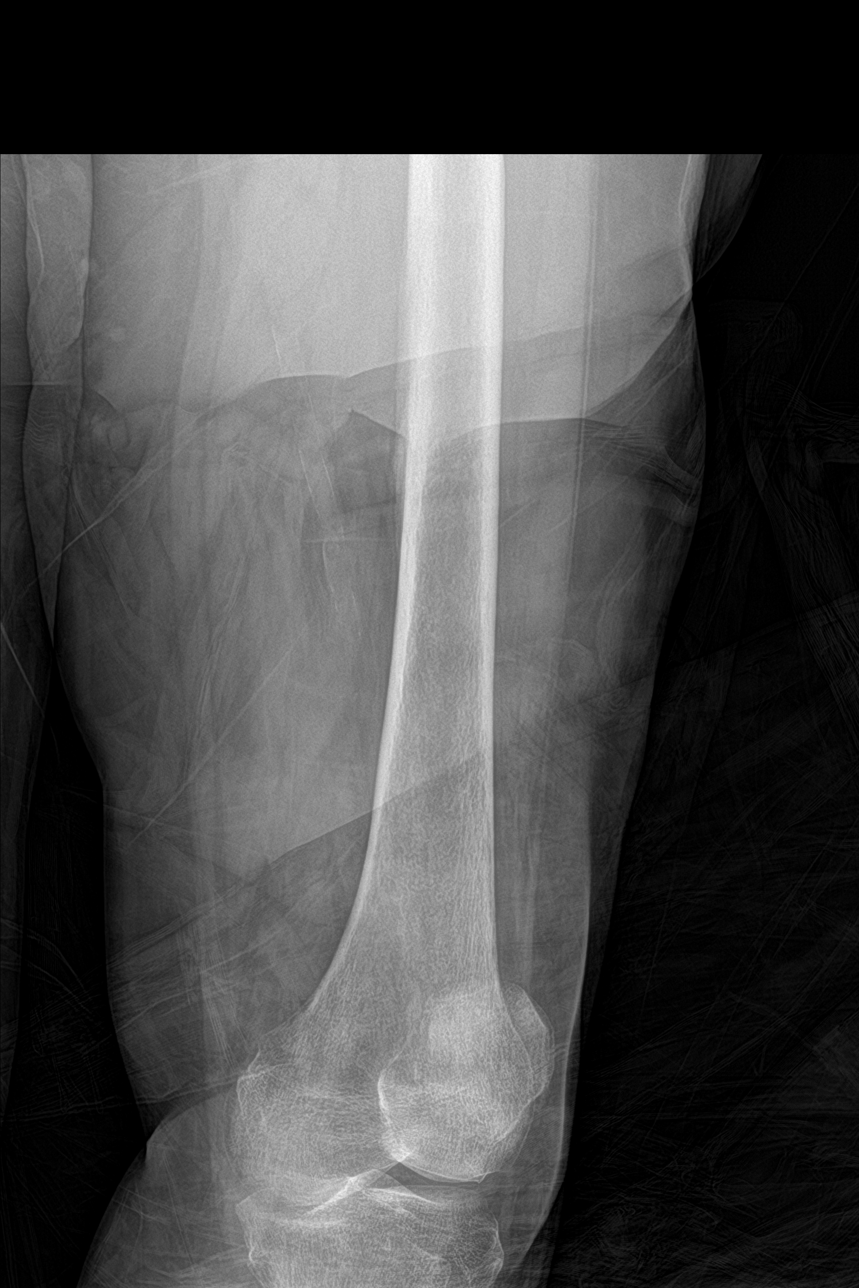

[femur lat (1 of 2)]
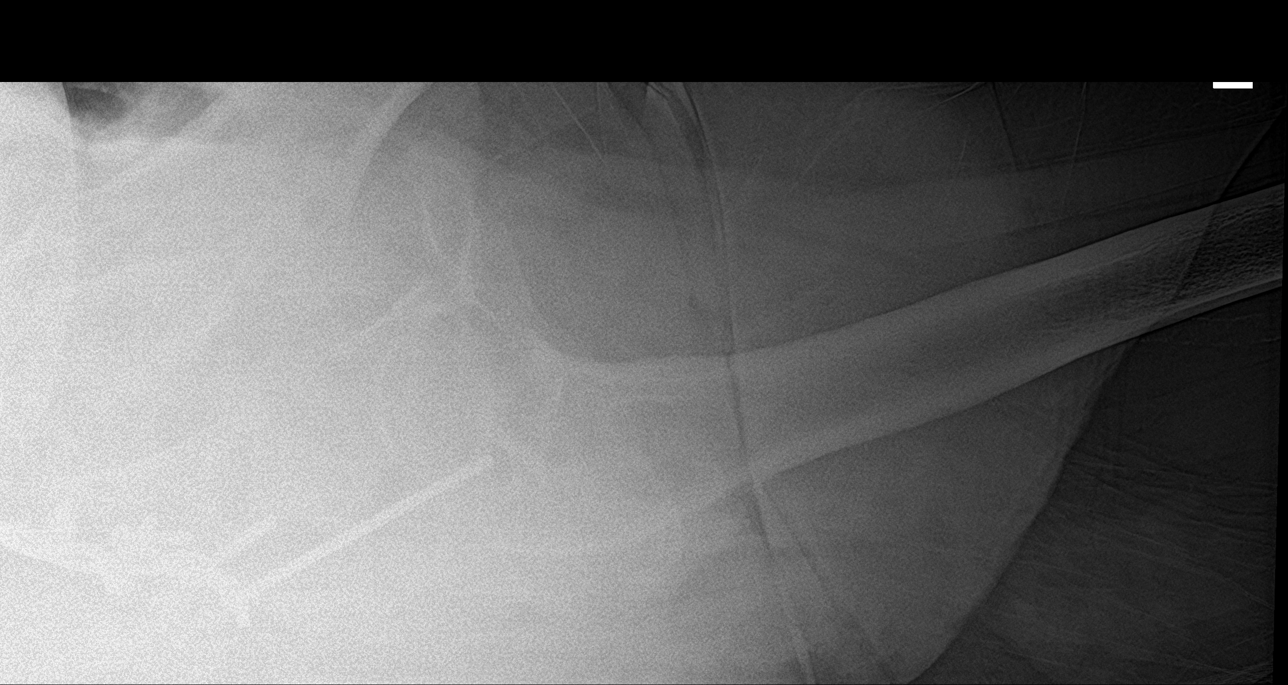

[femur lat (2 of 2)]
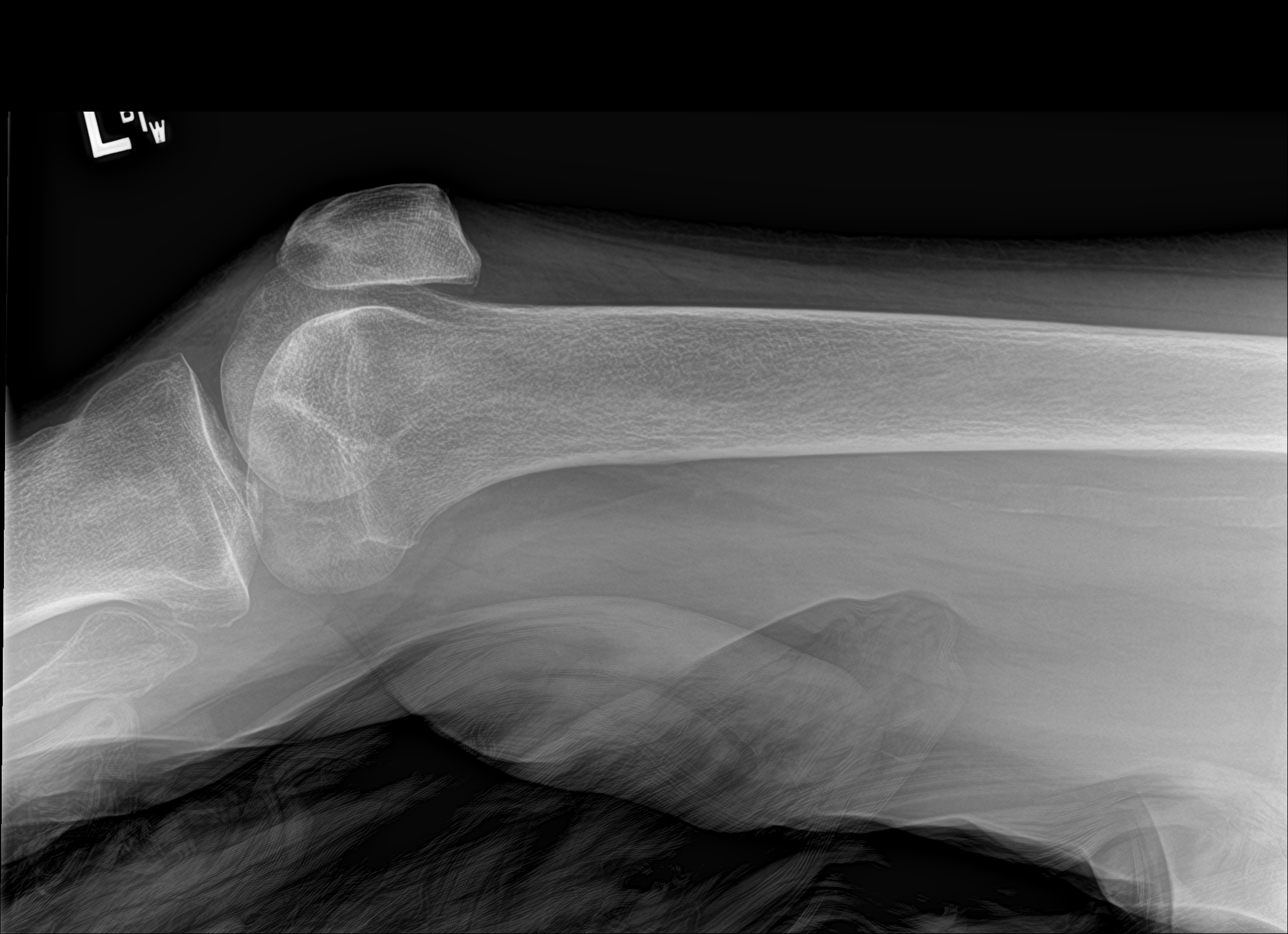

[4 of 4 positions shown; findings below may reference images not displayed]

FINDINGS: Osteopenia. There is a displaced transcervical fracture of the left
femoral neck. No fracture or dislocation of the distal left femur.
Plate and screw fixation of the right acetabulum. No acute displaced
fracture or dislocation of the pelvis or proximal right femur in
frontal view only.
IMPRESSION: 1. There is a displaced transcervical fracture of the left femoral
neck.

2.  No fracture or dislocation of the distal left femur.

3. Osteopenia, which limits sensitivity for additional fractures of
the pelvis.

## 2019-06-07 IMAGING — CT CT ABD-PELV W/O CM
1 series · 1 of 1 positions shown · non-contrast
Comparison: [DATE]

CLINICAL DATA: Abdominal trauma syncope, fall

EXAM:
CT CHEST WITHOUT CONTRAST
TECHNIQUE: Multidetector CT imaging of the chest was performed following the
standard protocol without IV contrast.

[Series 1: topogram 0.6 t20f · sagittal · 2.00mm/px · 1 of 1 slices shown]
[im 1/1]
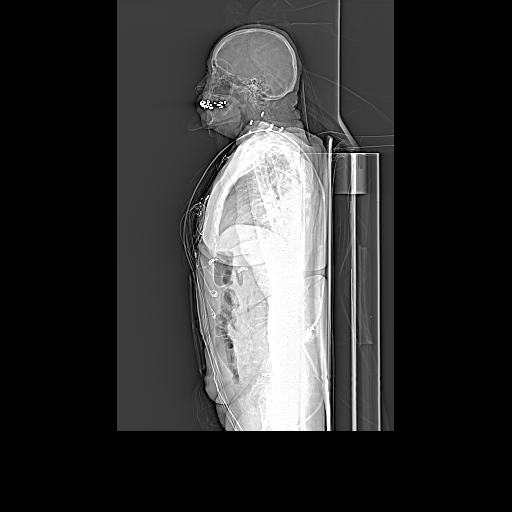

[1 of 1 positions shown; findings below may reference images not displayed]

FINDINGS: Cardiovascular: There is mild cardiomegaly. No significant
pericardial fluid/thickening. Great vessels are normal in course and
caliber. Limited evaluation of the vascular structures due to the
lack of intravenous contrast. Scattered aortic atherosclerosis.
Coronary artery calcifications are present.

Mediastinum/Nodes: No pneumomediastinum. No large mediastinal
hematoma. The trachea and esophagus are grossly unremarkable.

Lungs/Pleura:Scattered areas of bronchiectasis with architectural
distortion are seen predominantly within both lung apices and at the
posterior lower lungs. Centrilobular emphysematous changes seen at
both lung apices. No pneumothorax. No pleural effusion.

Musculoskeletal: Nondisplaced fractures are seen through the
anterior right fourth and fifth ribs.

Abdomen/pelvis:

Hepatobiliary: Homogeneous hepatic attenuation without traumatic
injury. No focal lesion. Gallbladder physiologically distended, no
calcified stone. No biliary dilatation.

Pancreas: No evidence for traumatic injury. Portions are partially
obscured by adjacent bowel loops and paucity of intra-abdominal fat.
No ductal dilatation or inflammation.

Spleen: Again noted is a 5 cm low-density lesion in the posterior
spleen. Normal in size.

Adrenals/Urinary Tract: No adrenal hemorrhage. Bilateral renal
atrophy is noted. There is a partially visualized low-density
lesions seen within the lower pole the left kidney measuring 1.2 cm.
Bladder is physiologically distended without wall thickening.

Stomach/Bowel: Suboptimally assessed without enteric contrast,
allowing for this, no evidence of bowel injury. Stomach
physiologically distended. There are no dilated or thickened small
or large bowel loops. Moderate stool burden. Scattered colonic
diverticula are noted. No evidence of mesenteric hematoma. No free
air free fluid.

Vascular/Lymphatic: No acute vascular injury. The abdominal aorta
and IVC are intact. No evidence of retroperitoneal, abdominal, or
pelvic adenopathy. Scattered aortic atherosclerosis is noted.

Reproductive: No acute abnormality. Partially calcified posterior
uterine fibroid is seen.

Other: No focal contusion or abnormality of the abdominal wall. A
small fat containing anterior umbilical hernia is noted.

Musculoskeletal: There is a comminuted mildly displaced left
transoral femoral neck fracture. The femoral head is still well
seated within the acetabulum. The patient is status post ORIF with
plate and screw fixation of the posterior right acetabulum. There is
a grade 1 anterolisthesis of L5 on S1. There is progression of
endplate sclerosis and reactive changes with erosions at L5-S1.
IMPRESSION: 1. No acute intrathoracic, abdominal, or pelvic injury, h wall a
owever somewhat limited due to lack of intravenous contrast.
2. Nondisplaced fractures through the right anterior fourth and
fifth ribs.
3. Areas of chronic interstitial lung changes and scarring seen
throughout both lungs with centrilobular emphysematous changes at
the lung apices.
4. Diverticulosis without diverticulitis.
5.  Aortic Atherosclerosis ([QX]-[QX]).
6. Comminuted mildly displaced left transcervical femoral neck
fracture.
7. Grade 1 anterolisthesis of L5 on S1 with advanced degenerative
changes as on prior radiograph dating back to [QX].

## 2019-06-07 IMAGING — DX DG CHEST 1V
1 series · 1 of 1 positions shown · non-contrast
Comparison: [DATE] chest radiograph and prior.

CLINICAL DATA: Syncopal episode.

EXAM:
CHEST  1 VIEW

[chest ap]
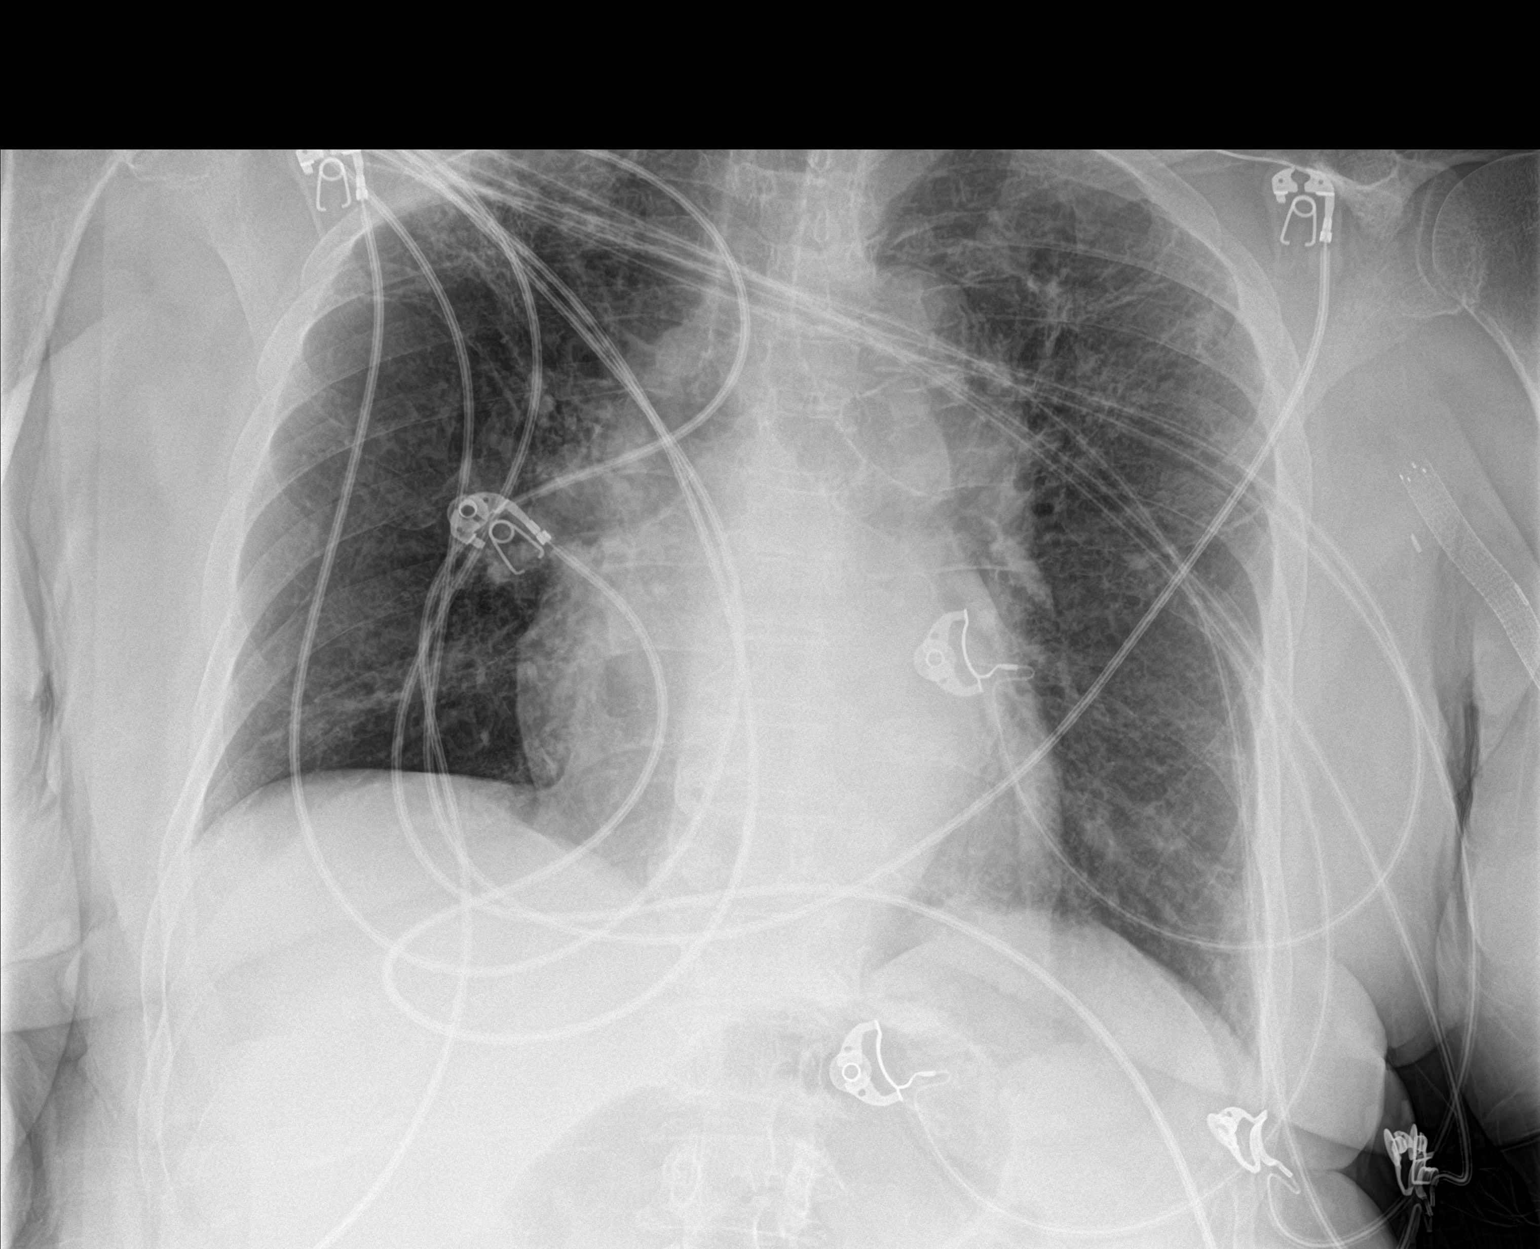

[1 of 1 positions shown; findings below may reference images not displayed]

FINDINGS: Overlying telemetry wires obscure visualization of the chest.

Bilateral upper lung scarring and right predominant volume loss,
unchanged. Patchy and streaky bibasilar opacities likely reflect
atelectasis.

No pneumothorax or pleural effusion.

Cardiomediastinal silhouette is grossly unchanged. Partially imaged
left upper extremity vascular stent.

Osteopenia and multilevel spondylosis. Aortic atherosclerotic
calcifications.
IMPRESSION: No active airspace disease.

Biapical scarring with right upper lung volume loss, grossly
unchanged.

## 2019-06-07 IMAGING — DX DG PELVIS 1-2V
1 series · 1 of 1 positions shown · non-contrast
Comparison: None.

CLINICAL DATA: Syncope, fall, left hip pain

EXAM:
PELVIS - 1-2 VIEW; LEFT FEMUR 2 VIEWS

[pelvis ap]
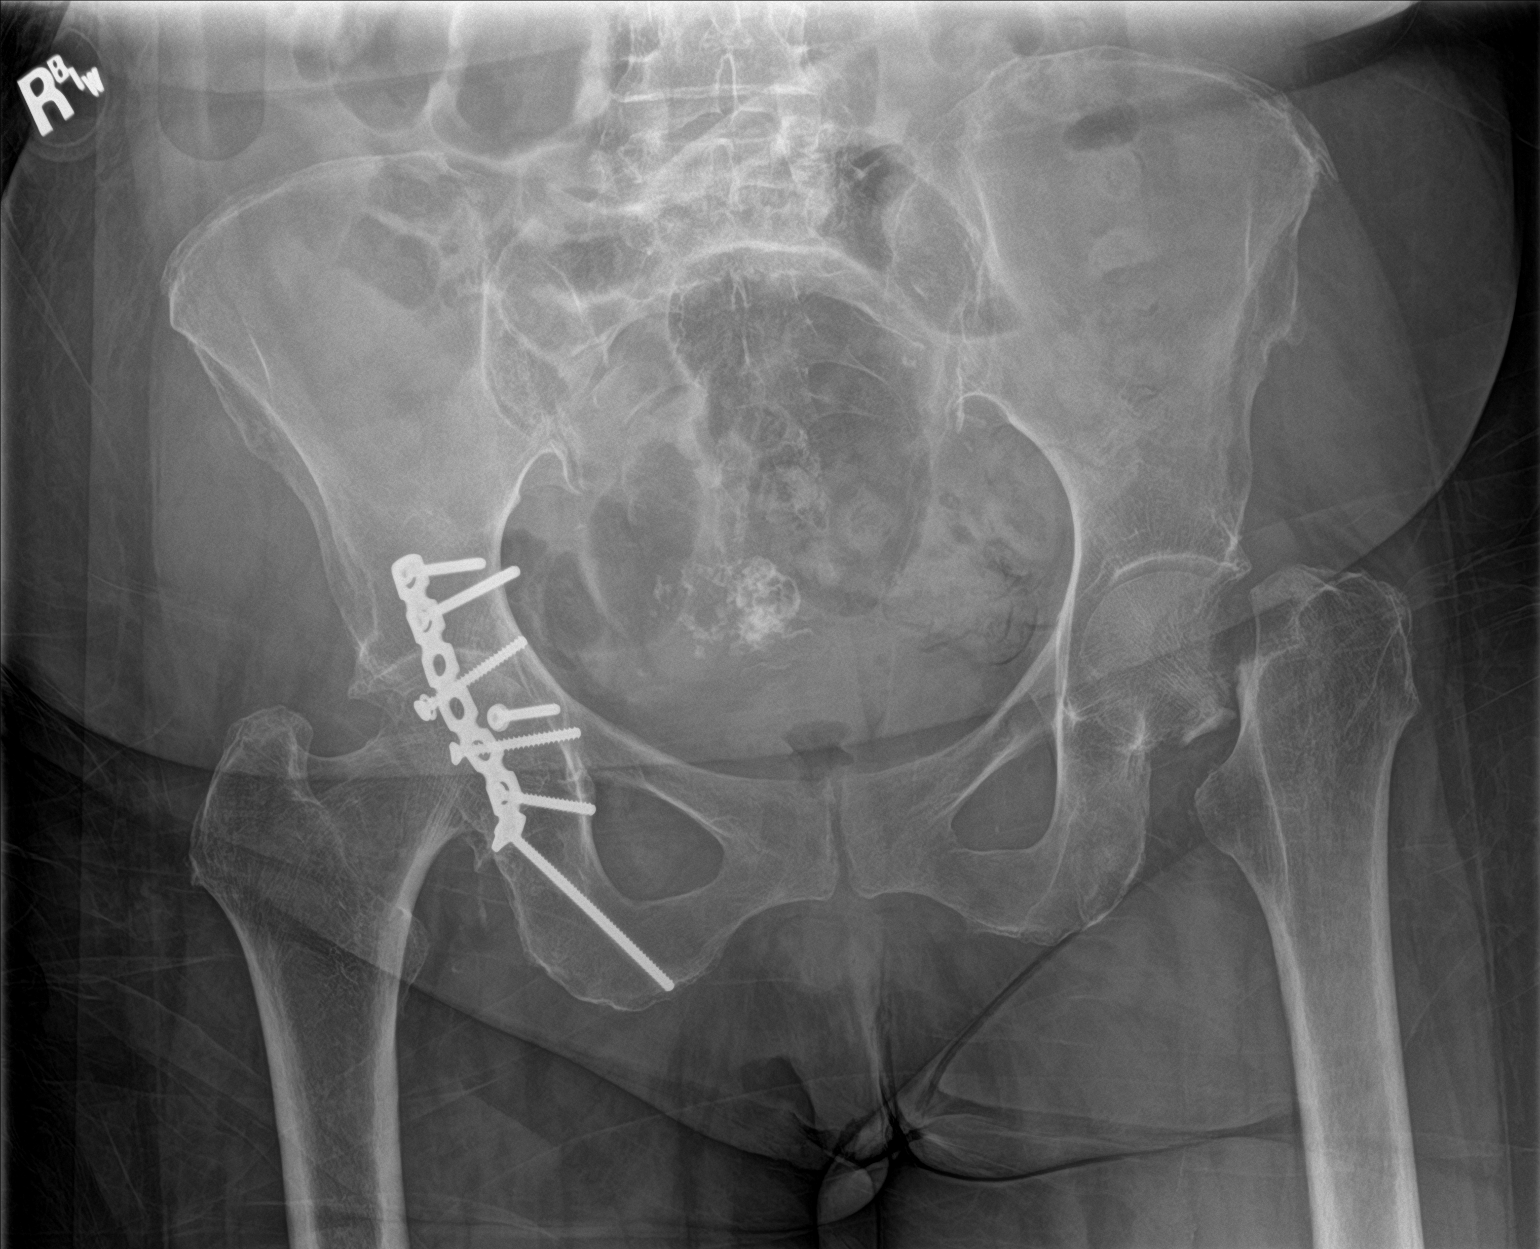

[1 of 1 positions shown; findings below may reference images not displayed]

FINDINGS: Osteopenia. There is a displaced transcervical fracture of the left
femoral neck. No fracture or dislocation of the distal left femur.
Plate and screw fixation of the right acetabulum. No acute displaced
fracture or dislocation of the pelvis or proximal right femur in
frontal view only.
IMPRESSION: 1. There is a displaced transcervical fracture of the left femoral
neck.

2.  No fracture or dislocation of the distal left femur.

3. Osteopenia, which limits sensitivity for additional fractures of
the pelvis.

## 2019-06-07 IMAGING — CT CT HEAD W/O CM
3 series · 14 of 47 positions shown, 16 images · non-contrast
Comparison: None.

CLINICAL DATA: Fall at home.  Head injury

EXAM:
CT HEAD WITHOUT CONTRAST
CT CERVICAL SPINE WITHOUT CONTRAST
TECHNIQUE: Multidetector CT imaging of the head and cervical spine was
performed following the standard protocol without intravenous
contrast. Multiplanar CT image reconstructions of the cervical spine
were also generated.

[Series 3: head 5.0 h30s · axial · 0.48mm/px · z∈[-118,+42]mm · 8 of 38 slices shown, 10 images]
[im 3/38  brain]
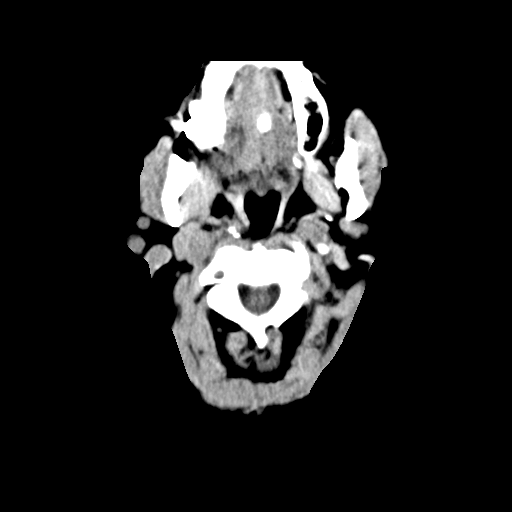
[im 3/38  bone]
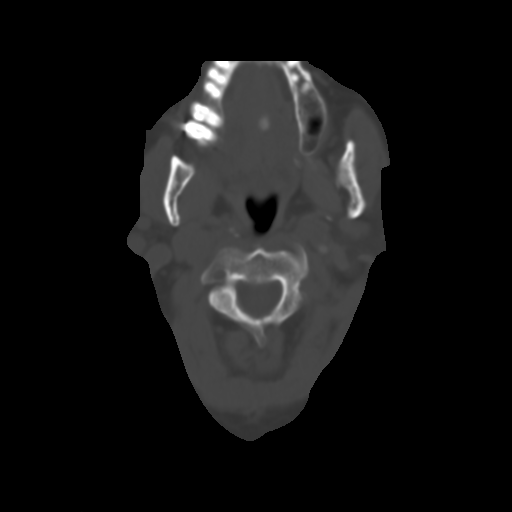
[im 8/38  brain]
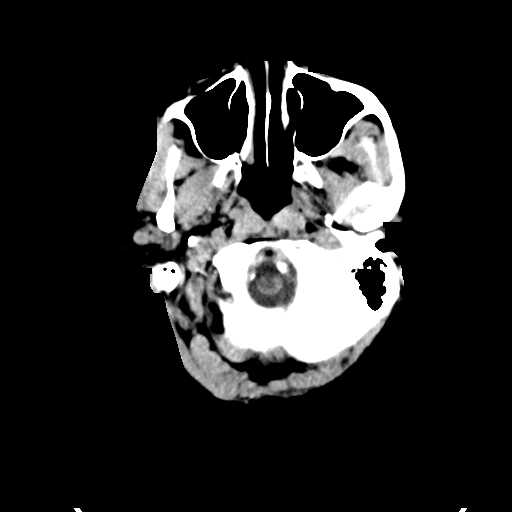
[im 12/38  brain]
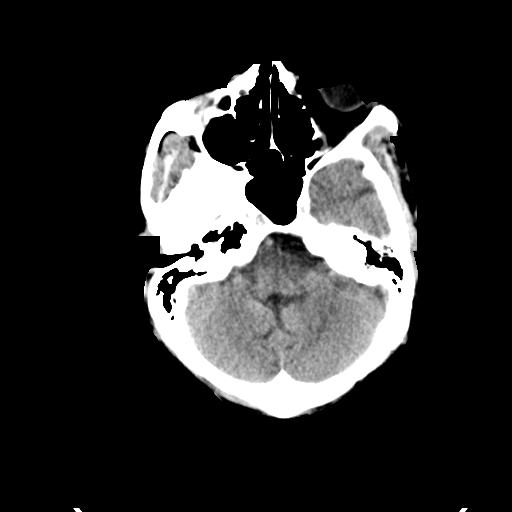
[im 17/38  brain]
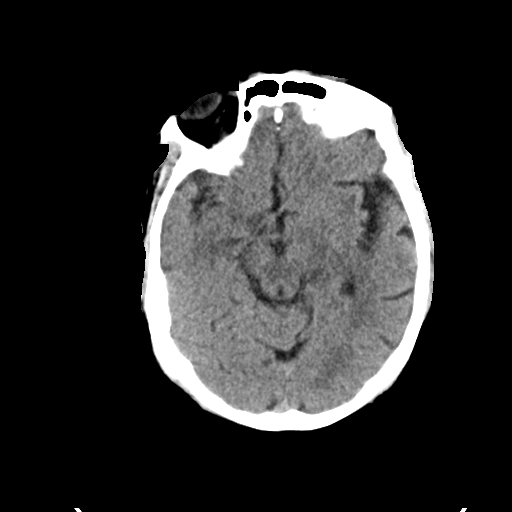
[im 21/38  brain]
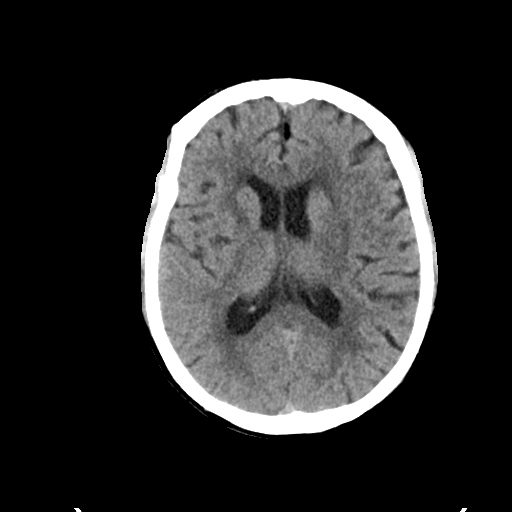
[im 21/38  bone]
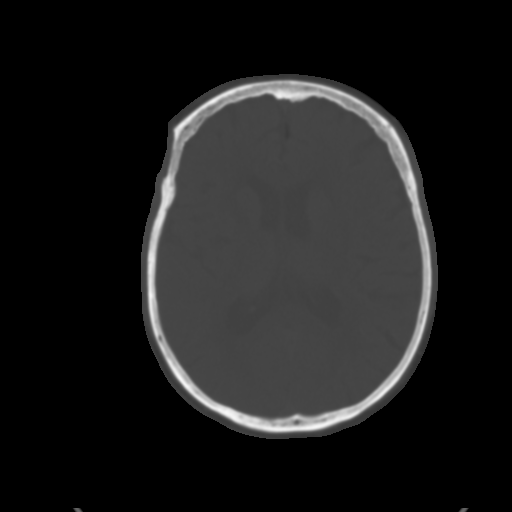
[im 26/38  brain]
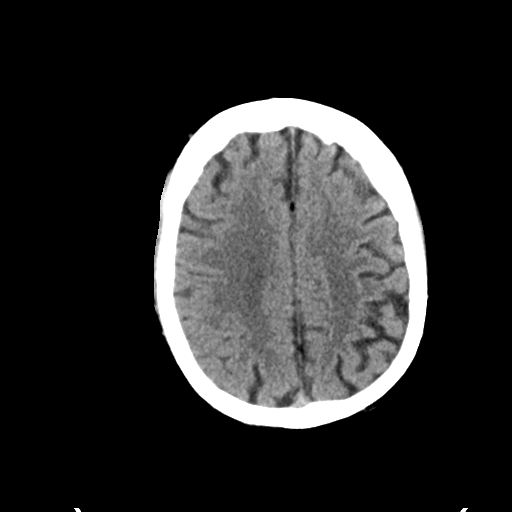
[im 30/38  brain]
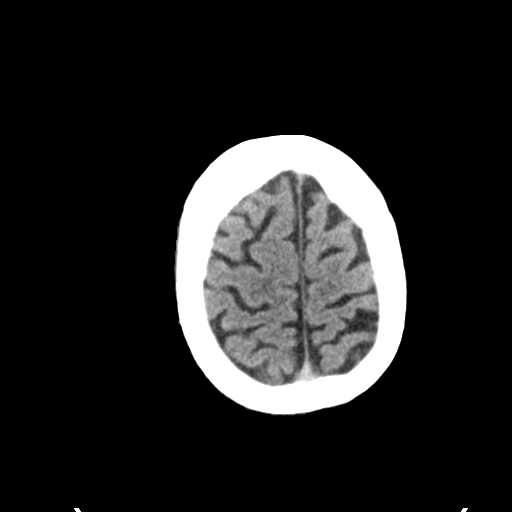
[im 35/38  brain]
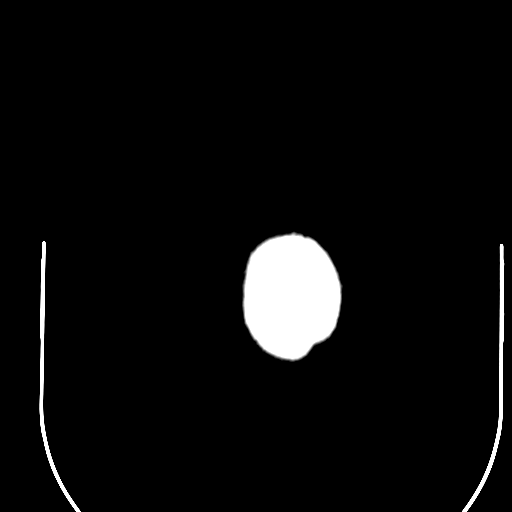

[Series 5: head 3.0 mpr cor · coronal · 0.37mm/px · 3 of 68 slices shown]
[im 23/68  brain]
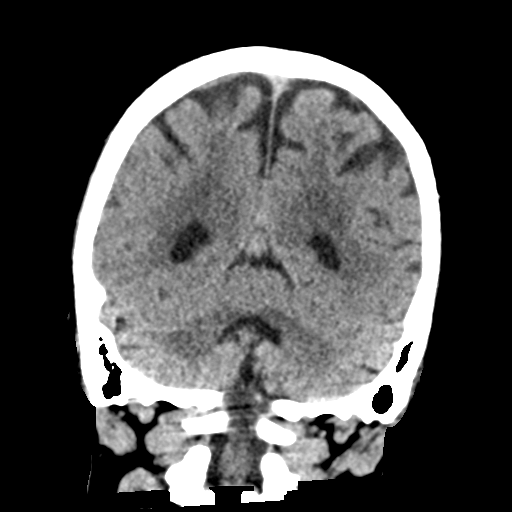
[im 30/68  brain]
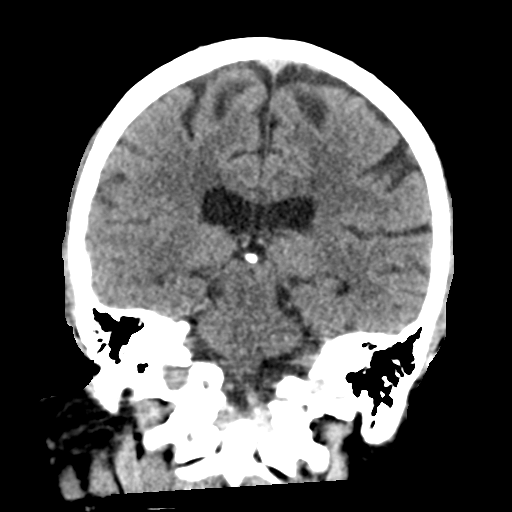
[im 38/68  brain]
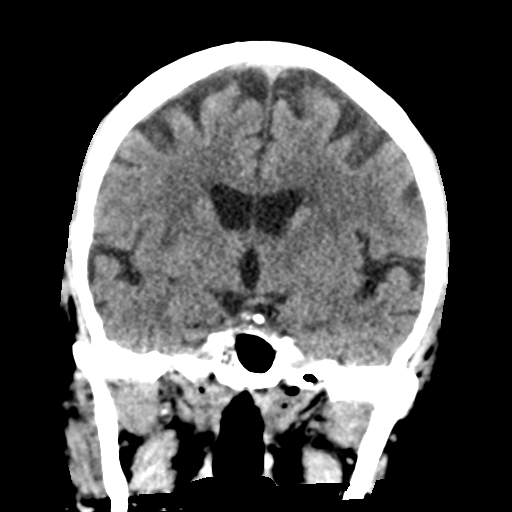

[Series 6: head 3.0 mpr sag · sagittal · 0.37mm/px · 3 of 63 slices shown]
[im 21/63  brain]
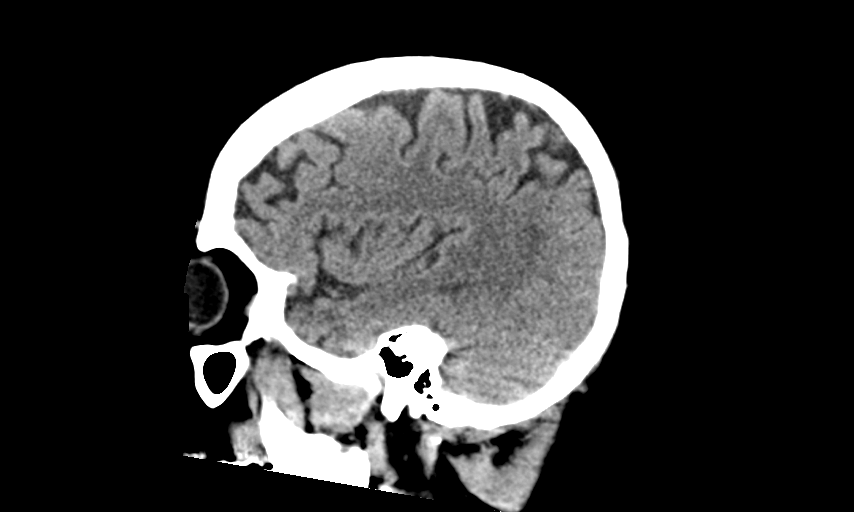
[im 32/63  brain]
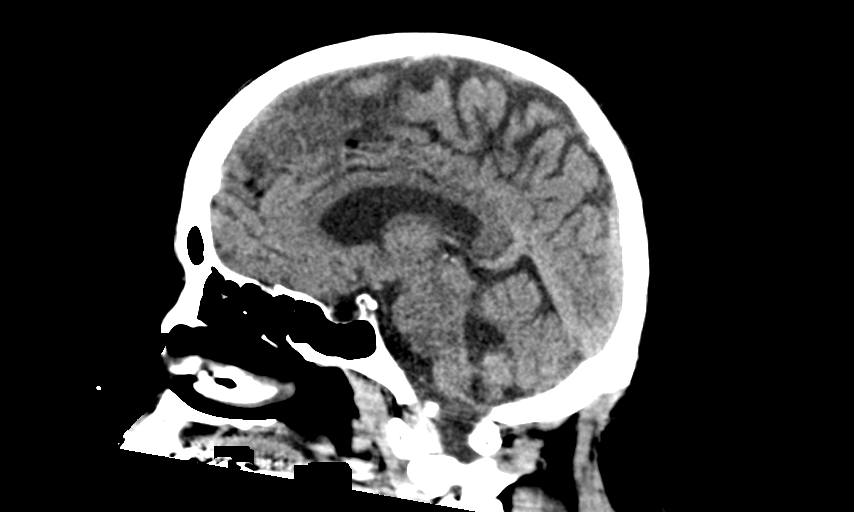
[im 42/63  brain]
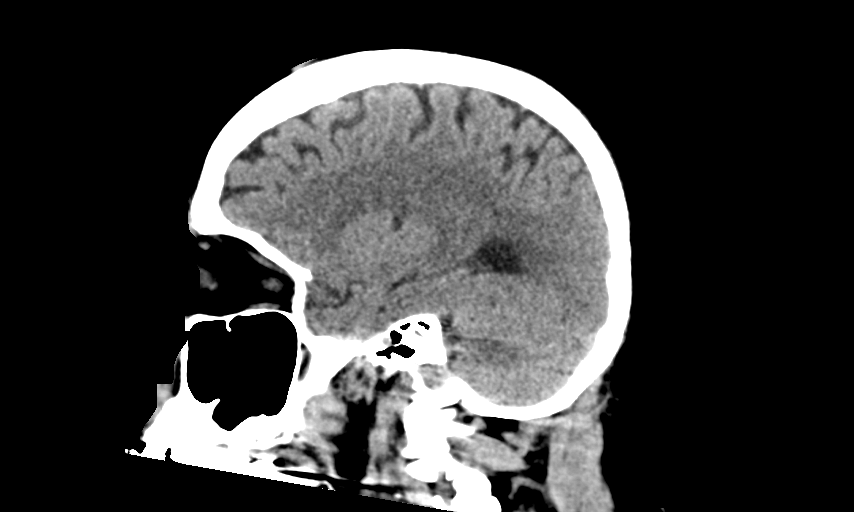

[14 of 47 positions shown; findings below may reference images not displayed]

FINDINGS: CT HEAD FINDINGS

Brain: Mild atrophy without hydrocephalus. Patchy white matter
hypodensity diffusely compatible with chronic microvascular
ischemia. No acute hemorrhage, mass, or infarction. Small
interhemispheric lipomas are present along the falx. This is felt to
be incidental. Corpus callosum normally formed.

Vascular: Negative for hyperdense vessel

Skull: Negative

Sinuses/Orbits: Paranasal sinuses clear. Bilateral cataract
extraction.

Other: None

CT CERVICAL SPINE FINDINGS

Alignment: Normal alignment with straightening of the cervical
lordosis

Skull base and vertebrae: Negative for fracture

Soft tissues and spinal canal: No soft tissue mass or edema.

Disc levels: Disc degeneration and spurring C4-5 and C5-6. Moderate
right foraminal stenosis at C5-6 due to spurring. Mild disc
degeneration and spurring on the left at C6-7.

Upper chest: CT chest reported separately.

Other: None
IMPRESSION: 1. No acute intracranial abnormality
2. Negative for cervical spine fracture. Mild to moderate cervical
spondylosis.

## 2019-06-07 IMAGING — CT CT CERVICAL SPINE W/O CM
3 of 4 series · 12 of 33 positions shown, 14 images · non-contrast
Comparison: None.

CLINICAL DATA: Fall at home.  Head injury

EXAM:
CT HEAD WITHOUT CONTRAST
CT CERVICAL SPINE WITHOUT CONTRAST
TECHNIQUE: Multidetector CT imaging of the head and cervical spine was
performed following the standard protocol without intravenous
contrast. Multiplanar CT image reconstructions of the cervical spine
were also generated.

[Series 5: c_spine 2.0 st · axial · 0.35mm/px · z∈[-230,-118]mm · 4 of 84 slices shown, 5 images]
[im 14/84  soft-tissue]
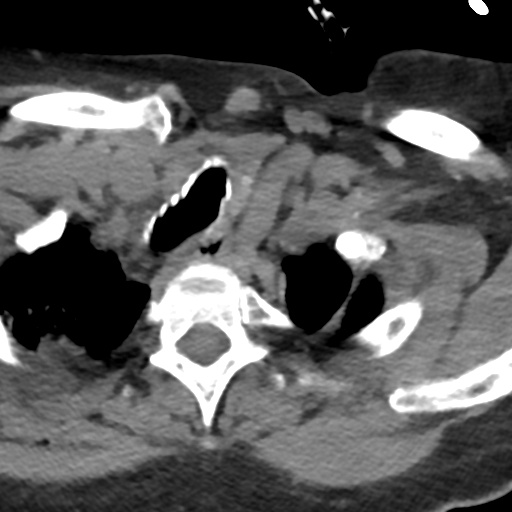
[im 14/84  bone]
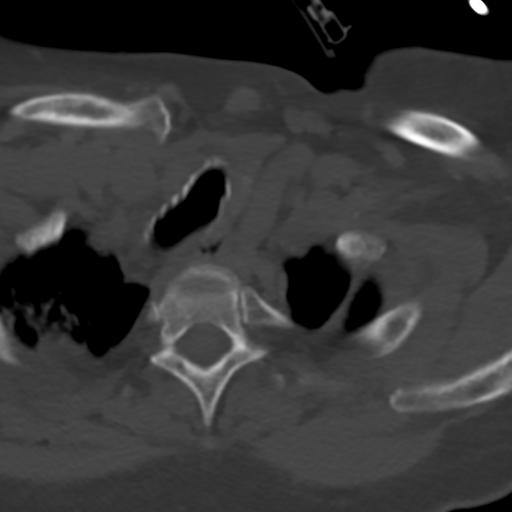
[im 28/84  bone]
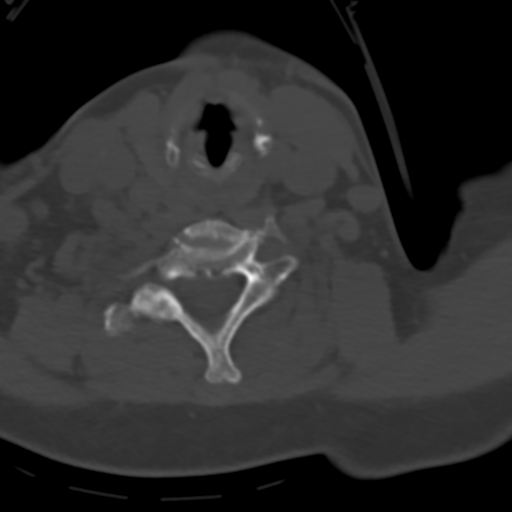
[im 56/84  bone]
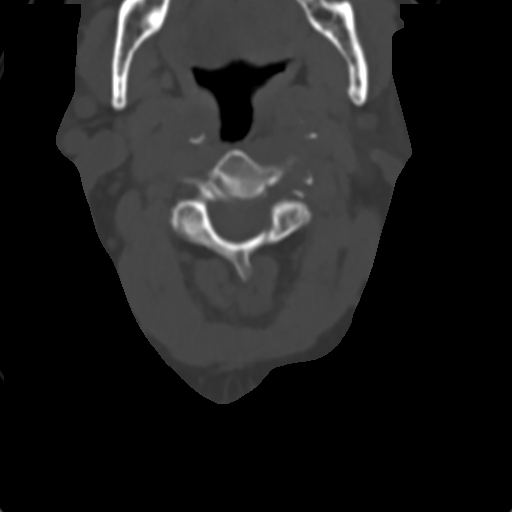
[im 70/84  bone]
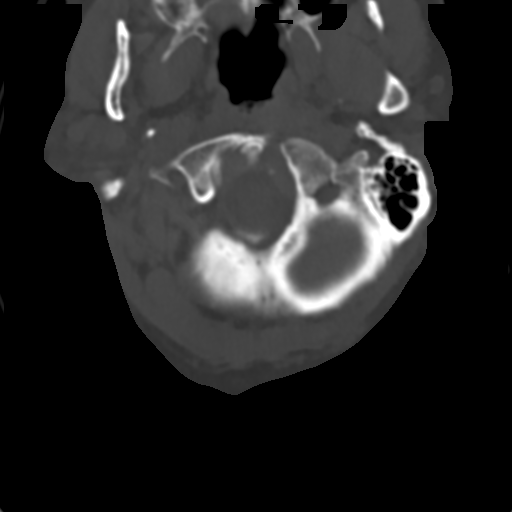

[Series 8: coronal bone · coronal · 0.23mm/px · 3 of 61 slices shown]
[im 13/61  bone]
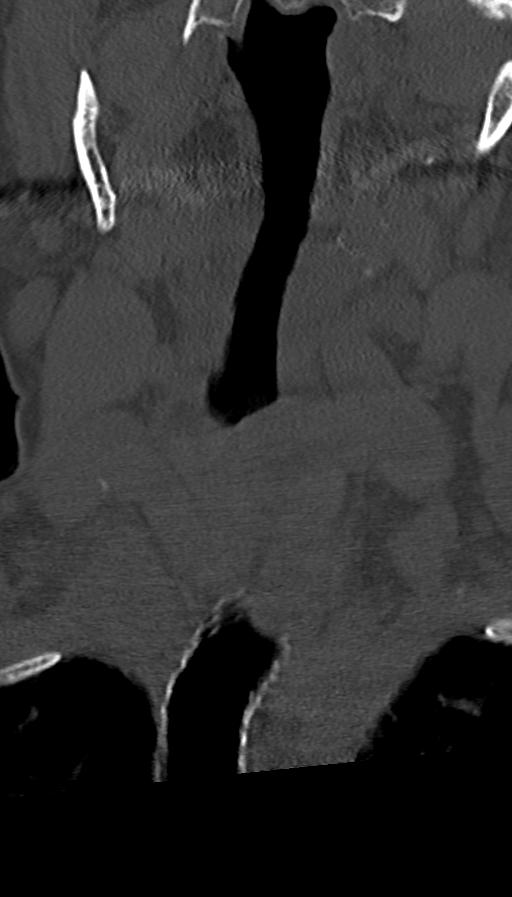
[im 25/61  bone]
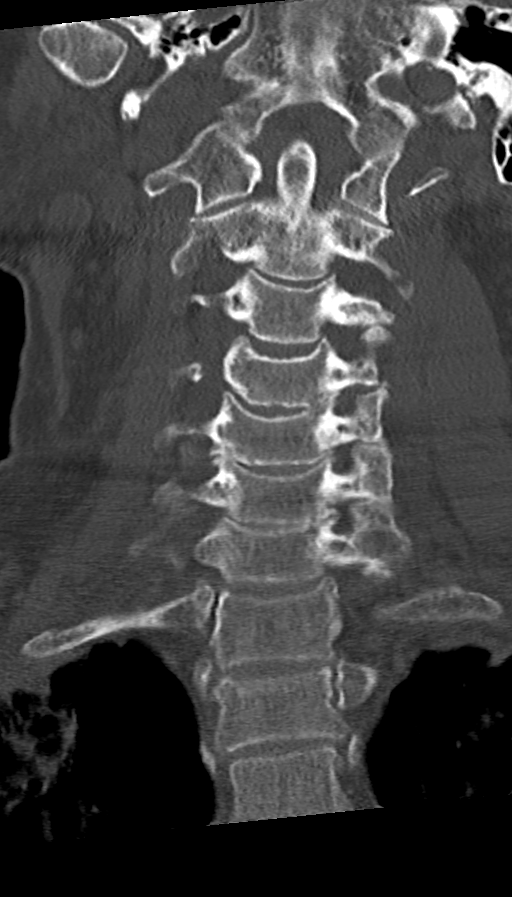
[im 36/61  bone]
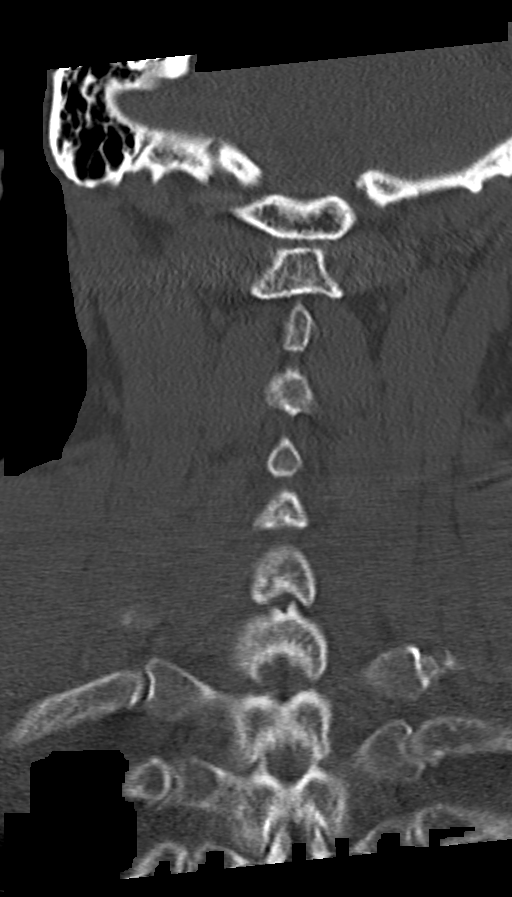

[Series 9: sagittal bone · sagittal · 0.26mm/px · 5 of 61 slices shown, 6 images]
[im 21/61  bone]
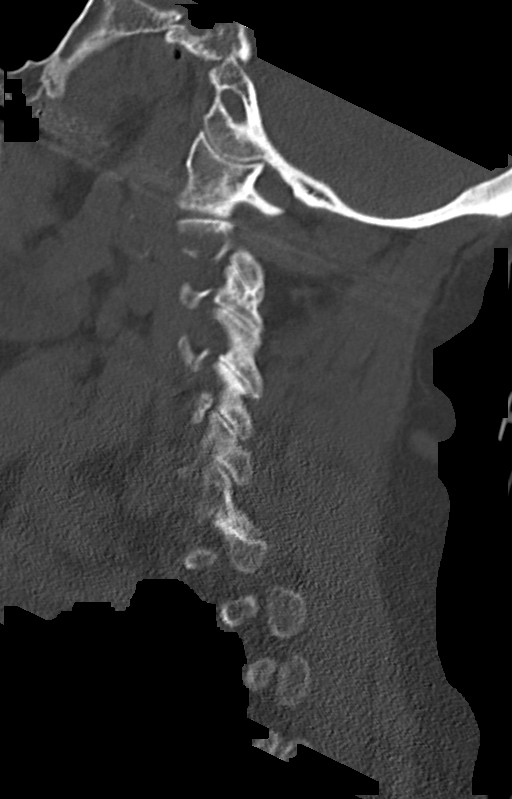
[im 26/61  bone]
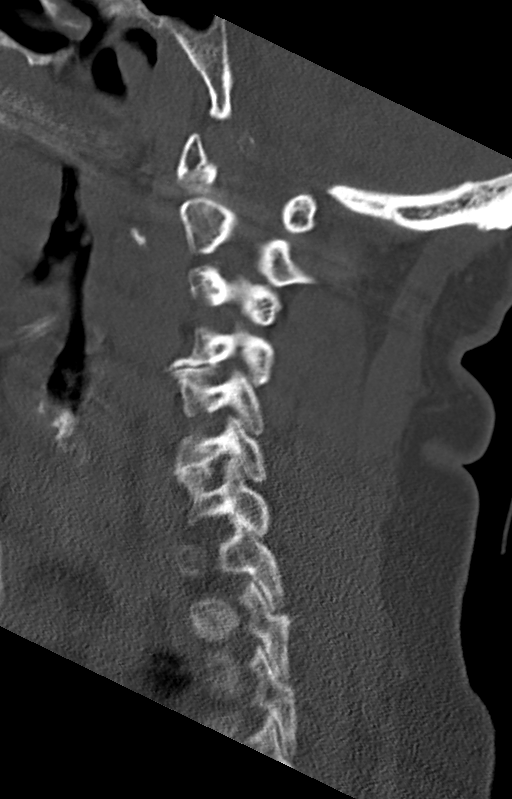
[im 31/61  soft-tissue]
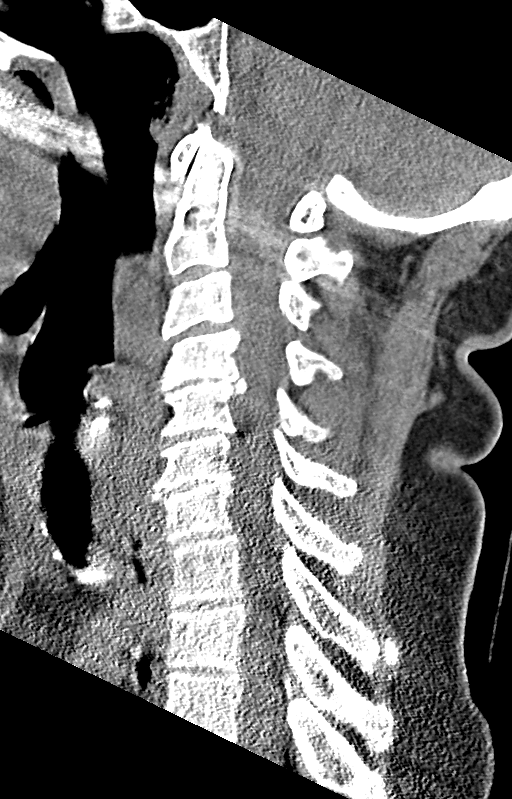
[im 31/61  bone]
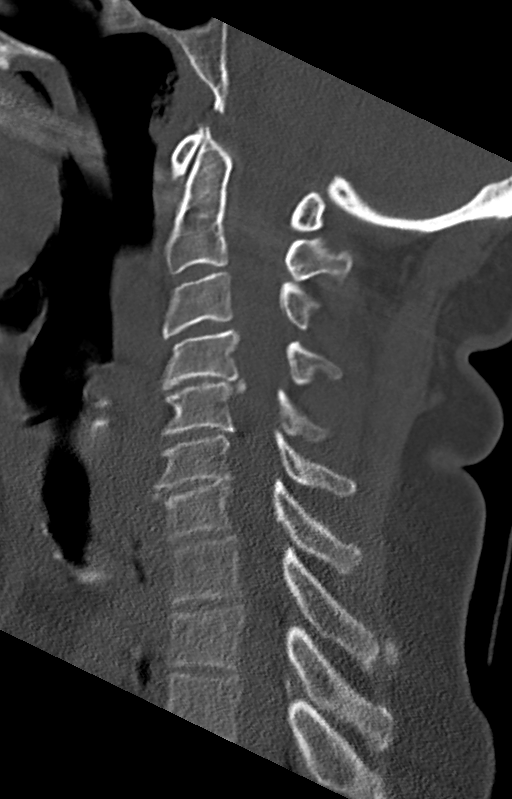
[im 36/61  bone]
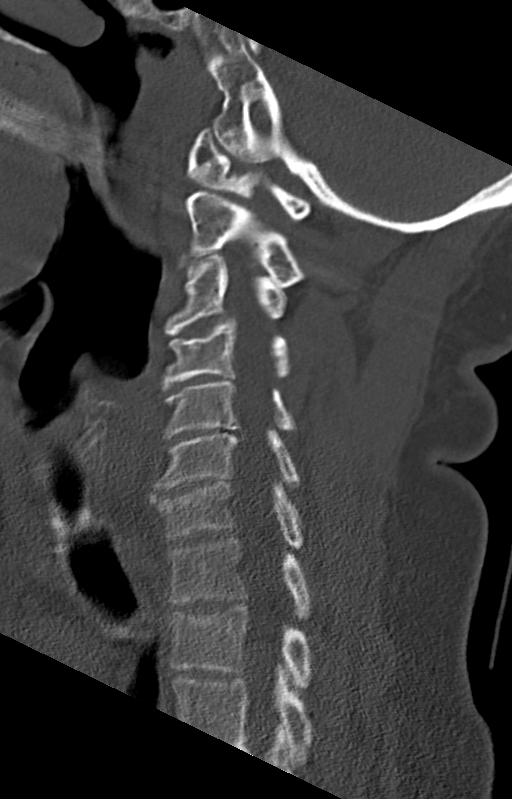
[im 41/61  bone]
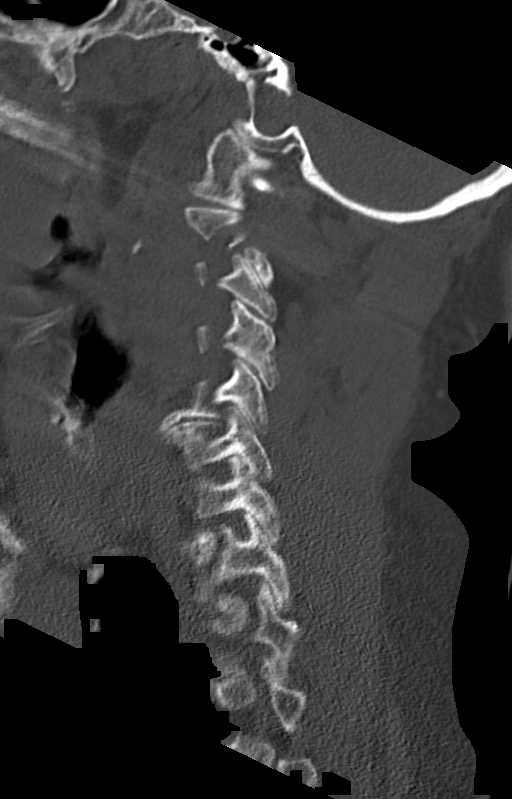

[12 of 33 positions shown; findings below may reference images not displayed]

FINDINGS: CT HEAD FINDINGS

Brain: Mild atrophy without hydrocephalus. Patchy white matter
hypodensity diffusely compatible with chronic microvascular
ischemia. No acute hemorrhage, mass, or infarction. Small
interhemispheric lipomas are present along the falx. This is felt to
be incidental. Corpus callosum normally formed.

Vascular: Negative for hyperdense vessel

Skull: Negative

Sinuses/Orbits: Paranasal sinuses clear. Bilateral cataract
extraction.

Other: None

CT CERVICAL SPINE FINDINGS

Alignment: Normal alignment with straightening of the cervical
lordosis

Skull base and vertebrae: Negative for fracture

Soft tissues and spinal canal: No soft tissue mass or edema.

Disc levels: Disc degeneration and spurring C4-5 and C5-6. Moderate
right foraminal stenosis at C5-6 due to spurring. Mild disc
degeneration and spurring on the left at C6-7.

Upper chest: CT chest reported separately.

Other: None
IMPRESSION: 1. No acute intracranial abnormality
2. Negative for cervical spine fracture. Mild to moderate cervical
spondylosis.

## 2019-06-07 IMAGING — CT CT CHEST W/O CM
3 of 5 series · 14 of 36 positions shown, 16 images · non-contrast
Comparison: [DATE]

CLINICAL DATA: Abdominal trauma syncope, fall

EXAM:
CT CHEST WITHOUT CONTRAST
TECHNIQUE: Multidetector CT imaging of the chest was performed following the
standard protocol without IV contrast.

[Series 3: cap wo 5.0 i31f 2 · axial · 0.86mm/px · z∈[-732,-282]mm · 6 of 128 slices shown, 8 images]
[im 19/128  mediastinal]
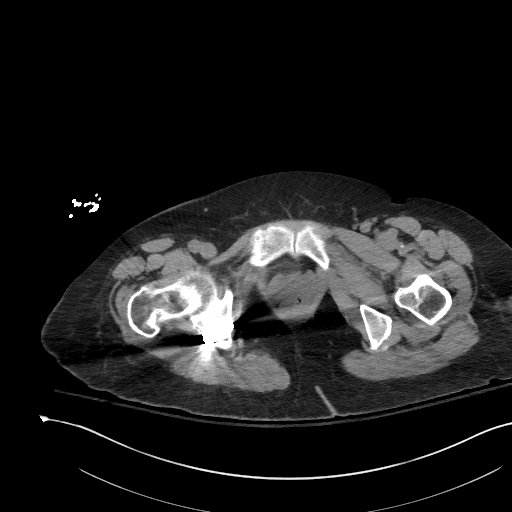
[im 19/128  lung]
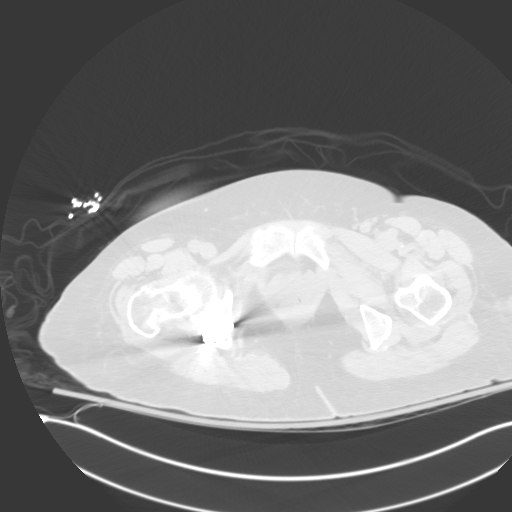
[im 37/128  lung]
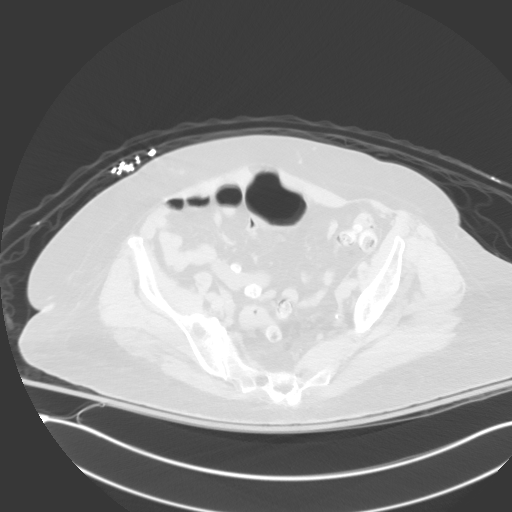
[im 55/128  lung]
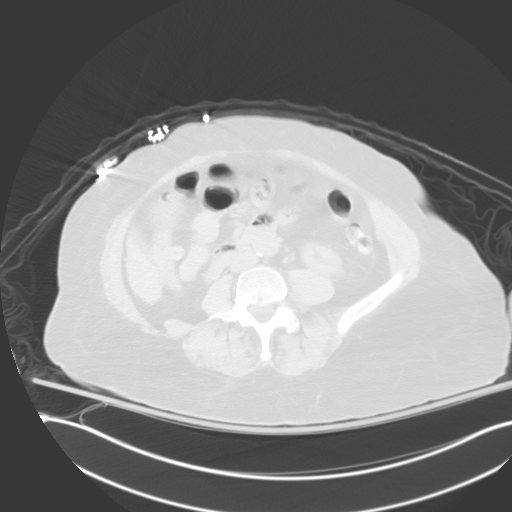
[im 73/128  lung]
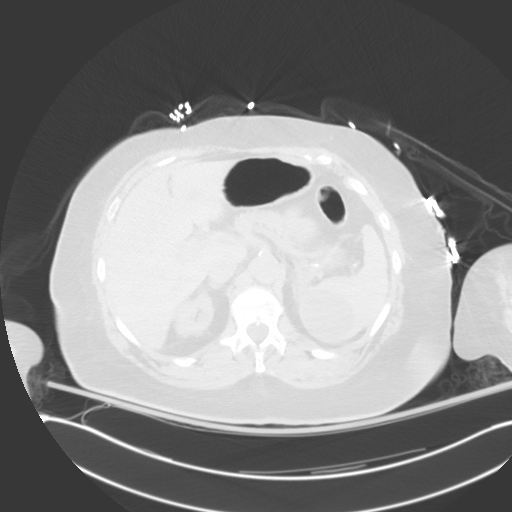
[im 91/128  mediastinal]
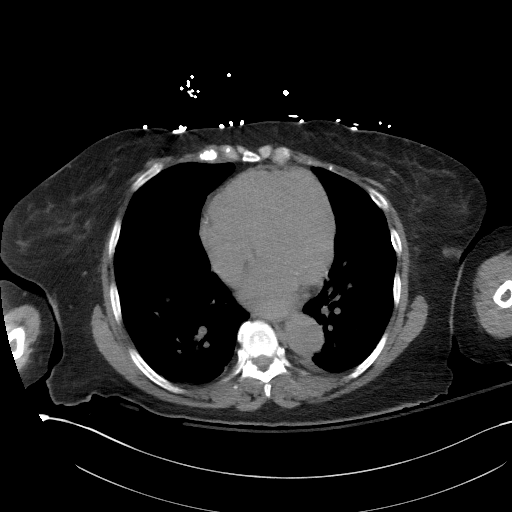
[im 91/128  lung]
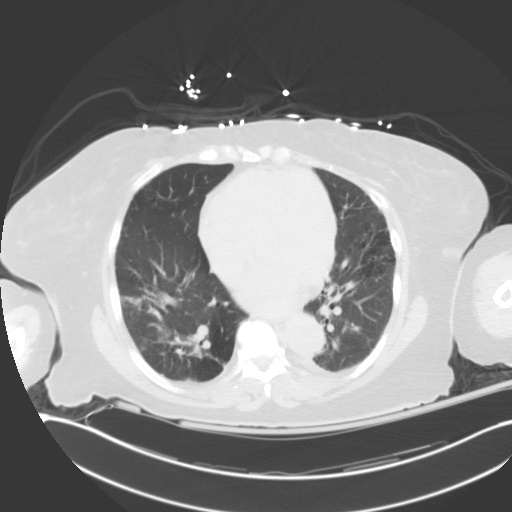
[im 109/128  lung]
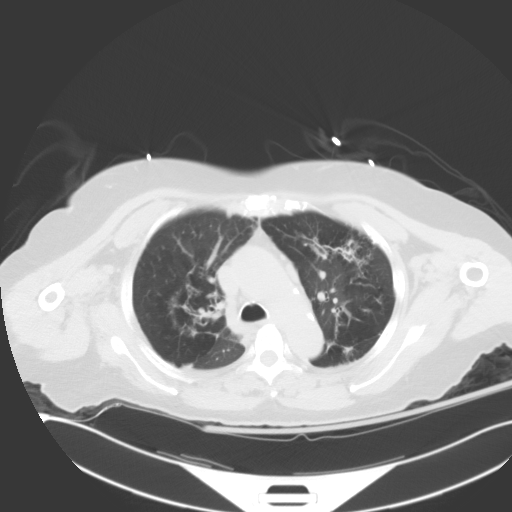

[Series 5: lungs · axial · 0.86mm/px · z∈[-465,-327]mm · 5 of 157 slices shown]
[im 18/157  lung]
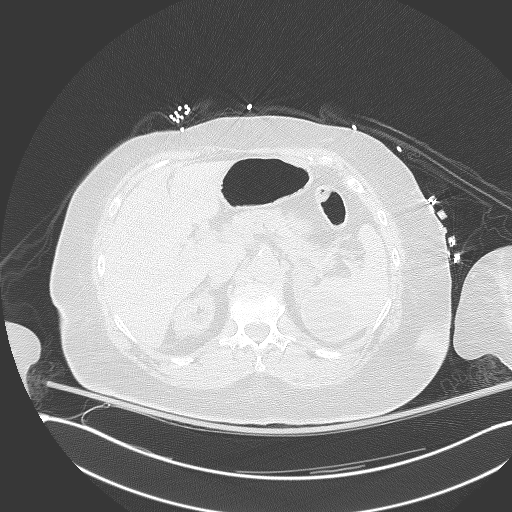
[im 35/157  lung]
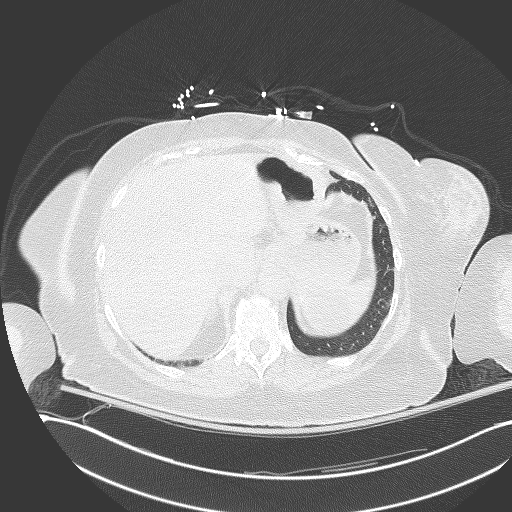
[im 53/157  lung]
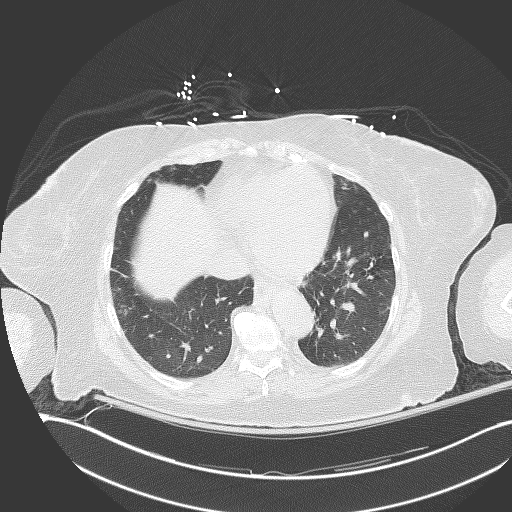
[im 70/157  lung]
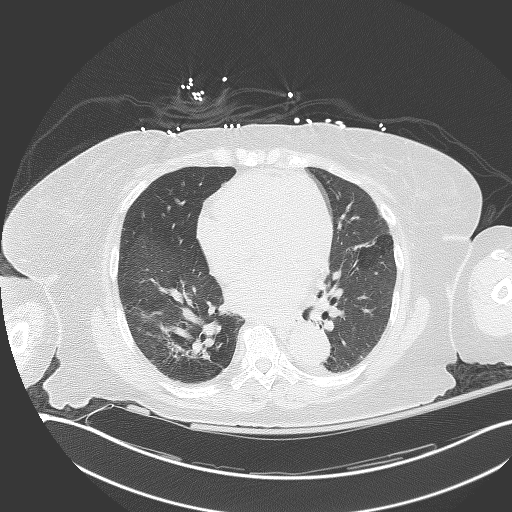
[im 87/157  lung]
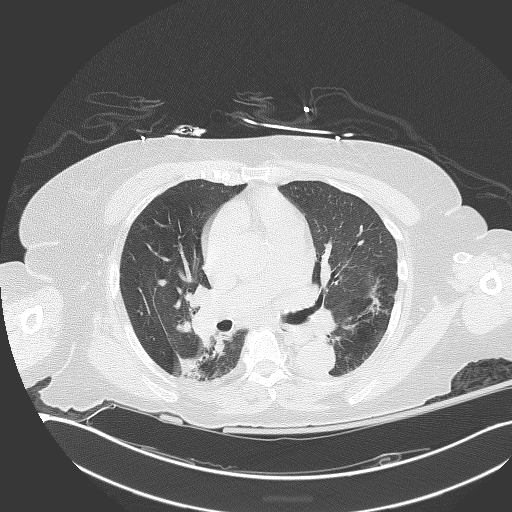

[Series 6: coronal · coronal · 0.88mm/px · 3 of 151 slices shown]
[im 31/151  lung]
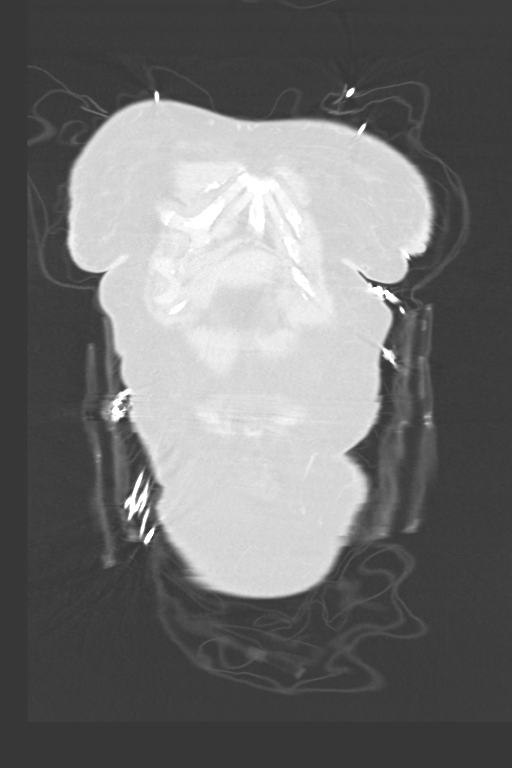
[im 61/151  lung]
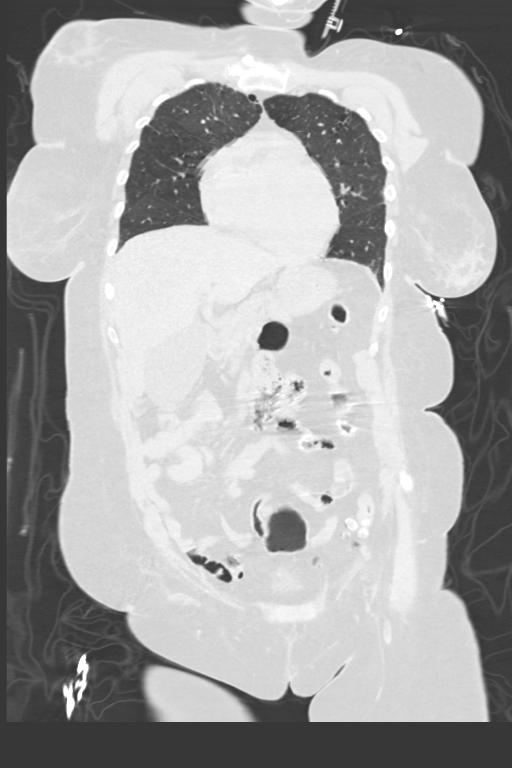
[im 91/151  lung]
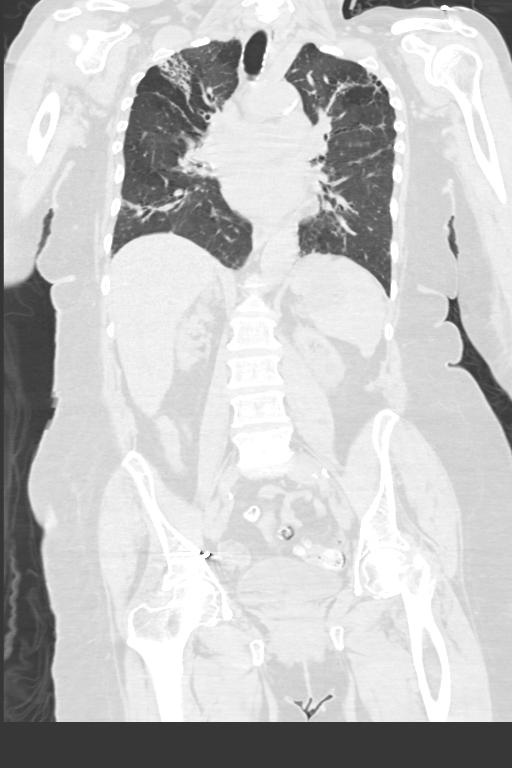

[14 of 36 positions shown; findings below may reference images not displayed]

FINDINGS: Cardiovascular: There is mild cardiomegaly. No significant
pericardial fluid/thickening. Great vessels are normal in course and
caliber. Limited evaluation of the vascular structures due to the
lack of intravenous contrast. Scattered aortic atherosclerosis.
Coronary artery calcifications are present.

Mediastinum/Nodes: No pneumomediastinum. No large mediastinal
hematoma. The trachea and esophagus are grossly unremarkable.

Lungs/Pleura:Scattered areas of bronchiectasis with architectural
distortion are seen predominantly within both lung apices and at the
posterior lower lungs. Centrilobular emphysematous changes seen at
both lung apices. No pneumothorax. No pleural effusion.

Musculoskeletal: Nondisplaced fractures are seen through the
anterior right fourth and fifth ribs.

Abdomen/pelvis:

Hepatobiliary: Homogeneous hepatic attenuation without traumatic
injury. No focal lesion. Gallbladder physiologically distended, no
calcified stone. No biliary dilatation.

Pancreas: No evidence for traumatic injury. Portions are partially
obscured by adjacent bowel loops and paucity of intra-abdominal fat.
No ductal dilatation or inflammation.

Spleen: Again noted is a 5 cm low-density lesion in the posterior
spleen. Normal in size.

Adrenals/Urinary Tract: No adrenal hemorrhage. Bilateral renal
atrophy is noted. There is a partially visualized low-density
lesions seen within the lower pole the left kidney measuring 1.2 cm.
Bladder is physiologically distended without wall thickening.

Stomach/Bowel: Suboptimally assessed without enteric contrast,
allowing for this, no evidence of bowel injury. Stomach
physiologically distended. There are no dilated or thickened small
or large bowel loops. Moderate stool burden. Scattered colonic
diverticula are noted. No evidence of mesenteric hematoma. No free
air free fluid.

Vascular/Lymphatic: No acute vascular injury. The abdominal aorta
and IVC are intact. No evidence of retroperitoneal, abdominal, or
pelvic adenopathy. Scattered aortic atherosclerosis is noted.

Reproductive: No acute abnormality. Partially calcified posterior
uterine fibroid is seen.

Other: No focal contusion or abnormality of the abdominal wall. A
small fat containing anterior umbilical hernia is noted.

Musculoskeletal: There is a comminuted mildly displaced left
transoral femoral neck fracture. The femoral head is still well
seated within the acetabulum. The patient is status post ORIF with
plate and screw fixation of the posterior right acetabulum. There is
a grade 1 anterolisthesis of L5 on S1. There is progression of
endplate sclerosis and reactive changes with erosions at L5-S1.
IMPRESSION: 1. No acute intrathoracic, abdominal, or pelvic injury, h wall a
owever somewhat limited due to lack of intravenous contrast.
2. Nondisplaced fractures through the right anterior fourth and
fifth ribs.
3. Areas of chronic interstitial lung changes and scarring seen
throughout both lungs with centrilobular emphysematous changes at
the lung apices.
4. Diverticulosis without diverticulitis.
5.  Aortic Atherosclerosis ([QX]-[QX]).
6. Comminuted mildly displaced left transcervical femoral neck
fracture.
7. Grade 1 anterolisthesis of L5 on S1 with advanced degenerative
changes as on prior radiograph dating back to [QX].

## 2019-06-07 MED ORDER — INSULIN ASPART 100 UNIT/ML IV SOLN
5.0000 [IU] | Freq: Once | INTRAVENOUS | Status: AC
  Administered 2019-06-07: 14:00:00 5 [IU] via INTRAVENOUS

## 2019-06-07 MED ORDER — LABETALOL HCL 5 MG/ML IV SOLN
10.0000 mg | Freq: Once | INTRAVENOUS | Status: AC
  Administered 2019-06-07: 10 mg via INTRAVENOUS
  Filled 2019-06-07: qty 4

## 2019-06-07 MED ORDER — CHLORHEXIDINE GLUCONATE CLOTH 2 % EX PADS
6.0000 | MEDICATED_PAD | Freq: Every day | CUTANEOUS | Status: DC
  Administered 2019-06-10 – 2019-06-16 (×4): 6 via TOPICAL

## 2019-06-07 MED ORDER — ALBUTEROL SULFATE HFA 108 (90 BASE) MCG/ACT IN AERS
2.0000 | INHALATION_SPRAY | Freq: Once | RESPIRATORY_TRACT | Status: AC
  Administered 2019-06-07: 14:00:00 2 via RESPIRATORY_TRACT
  Filled 2019-06-07: qty 6.7

## 2019-06-07 MED ORDER — SODIUM POLYSTYRENE SULFONATE 15 GM/60ML PO SUSP
30.0000 g | Freq: Once | ORAL | Status: AC
  Administered 2019-06-07: 18:00:00 30 g via ORAL
  Filled 2019-06-07: qty 120

## 2019-06-07 MED ORDER — HYDROMORPHONE HCL 1 MG/ML IJ SOLN
0.5000 mg | Freq: Once | INTRAMUSCULAR | Status: AC
  Administered 2019-06-07: 0.5 mg via INTRAVENOUS
  Filled 2019-06-07: qty 1

## 2019-06-07 MED ORDER — CALCIUM GLUCONATE-NACL 1-0.675 GM/50ML-% IV SOLN
1.0000 g | Freq: Once | INTRAVENOUS | Status: AC
  Administered 2019-06-07: 18:00:00 1000 mg via INTRAVENOUS
  Filled 2019-06-07: qty 50

## 2019-06-07 MED ORDER — OXYCODONE HCL 5 MG PO TABS
5.0000 mg | ORAL_TABLET | ORAL | Status: AC | PRN
  Administered 2019-06-08 (×2): 5 mg via ORAL
  Filled 2019-06-07 (×2): qty 1

## 2019-06-07 MED ORDER — HYDROMORPHONE HCL 1 MG/ML IJ SOLN
0.5000 mg | Freq: Once | INTRAMUSCULAR | Status: AC
  Administered 2019-06-07: 14:00:00 0.5 mg via INTRAVENOUS
  Filled 2019-06-07: qty 1

## 2019-06-07 MED ORDER — DEXTROSE 50 % IV SOLN
1.0000 | Freq: Once | INTRAVENOUS | Status: AC
  Administered 2019-06-07: 14:00:00 50 mL via INTRAVENOUS
  Filled 2019-06-07: qty 50

## 2019-06-07 MED ORDER — SODIUM ZIRCONIUM CYCLOSILICATE 10 G PO PACK
10.0000 g | PACK | Freq: Once | ORAL | Status: AC
  Administered 2019-06-07: 22:00:00 10 g via ORAL
  Filled 2019-06-07: qty 1

## 2019-06-07 MED ORDER — HYDROMORPHONE HCL 1 MG/ML IJ SOLN
0.5000 mg | Freq: Once | INTRAMUSCULAR | Status: AC
  Administered 2019-06-07: 13:00:00 0.5 mg via INTRAVENOUS
  Filled 2019-06-07: qty 1

## 2019-06-07 NOTE — Consult Note (Addendum)
[facility] Renal Consultation Note    Indication for Consultation:  Management of ESRD/hemodialysis; anemia, hypertension/volume and secondary hyperparathyroidism  HPI: Bianca Myers is a 74 y.o. female with ESRD on HD, HTN, DM2, DJD, Arthritis, Jehovah's Witness. She is admitted with left hip fracture. She had a syncopal event at home this am and fell. Woke up with hip pain. Pelvis xray shows displaced transcervical fracture of the left femoral neck. Ortho consulted. Labs were notable for hyperkalemia with K+ 6.9   Dialyzes MWF at [facility]. Last dialysis 4/2. She went dialysis on Monday but got frustrated by difficult cannulation of AVF and left without treatment. She has been referred for outpatient evaluation of her fistula.   Past Medical History:  Diagnosis Date  . Anemia   . Arthritis   . Chronic kidney disease (CKD) stage G5/A1, glomerular filtration rate (GFR) less than or equal to 15 mL/min/1.73 square meter and albuminuria creatinine ratio less than 30 mg/g (HCC)   . Depression   . Diabetes mellitus ([place])   . Gout   . History of anemia due to chronic kidney disease   . History of degenerative disc disease   . Hypertension   . Pneumonia   . Secondary hyperparathyroidism ([place])   . Spondylisthesis    Past Surgical History:  Procedure Laterality Date  . A/V FISTULAGRAM Left 12/16/2018   Procedure: A/V FISTULAGRAM;  Surgeon: Marty Heck, MD;  Location: [place] [facility];  Service: Cardiovascular;  Laterality: Left;  . AV FISTULA PLACEMENT Left 08/24/2018   Procedure: ARTERIOVENOUS (AV) FISTULA CREATION;  Surgeon: Waynetta Sandy, MD;  Location: [place];  Service: Vascular;  Laterality: Left;  . BASCILIC VEIN TRANSPOSITION Left 11/04/2018   Procedure: BASCILIC VEIN TRANSPOSITION SECOND STAGE;  Surgeon: Waynetta Sandy, MD;  Location: [place];  Service: Vascular;  Laterality: Left;  . COLONOSCOPY    . HIP SURGERY    . IR  FLUORO GUIDE CV LINE RIGHT  08/21/2018  . IR REMOVAL TUN CV CATH W/O FL  03/11/2019  . IR US GUIDE VASC ACCESS RIGHT  08/21/2018  . PERIPHERAL VASCULAR INTERVENTION Left 12/16/2018   Procedure: PERIPHERAL VASCULAR INTERVENTION;  Surgeon: Marty Heck, MD;  Location: [place] [facility];  Service: Cardiovascular;  Laterality: Left;  arm fistula   Family History  Family history unknown: Yes   Social History:  reports that she quit smoking about 48 years ago. She has never used smokeless tobacco. She reports previous alcohol use. She reports that she does not use drugs. No Known Allergies Prior to Admission medications   Medication Sig Start Date End Date Taking? Authorizing Provider  acetaminophen (TYLENOL) 650 MG CR tablet Take 1,300 mg by mouth every 8 (eight) hours as needed for pain.    [provider]  amLODipine (NORVASC) 5 MG tablet Take 5 mg by mouth at bedtime.    [provider]  Ascorbic Acid (VITAMIN C) 1000 MG tablet Take 1,000 mg by mouth 2 (two) times a day.     [provider]  atorvastatin (LIPITOR) 10 MG tablet Take 10 mg by mouth at bedtime. 05/14/18   [provider]  ferric citrate (AURYXIA) 1 GM 210 MG(Fe) tablet Take 420 mg by mouth 3 (three) times daily with meals.    [provider]  furosemide (LASIX) 40 MG tablet Take 1 tablet (40 mg total) by mouth every Tuesday, Thursday, Saturday, and Sunday. Patient taking differently: Take 80 mg by mouth every  Tuesday, Thursday, Saturday, and Sunday.  09/04/18   Nita Sells, MD  labetalol (NORMODYNE) 200 MG tablet Take 200 mg by mouth 2 (two) times daily.    [provider]  lidocaine-prilocaine (EMLA) cream Apply 1 application topically as directed. Prior to Dialysis 02/11/19   [provider]  multivitamin (RENA-VIT) TABS tablet Take 1 tablet by mouth at bedtime. 08/27/18   Dhungel, Flonnie Overman, MD  Omega-3 Fatty Acids (FISH OIL) 1000 MG CAPS Take 1,000 mg by  mouth 2 (two) times daily.     [provider]  polyethylene glycol (MIRALAX) 17 g packet Take 17 g by mouth daily as needed for moderate constipation. 08/27/18   Dhungel, Nishant, MD  Turmeric 500 MG CAPS Take 500 mg by mouth daily.     [provider]   Current Facility-Administered Medications  Medication Dose Route Frequency Provider Last Rate Last Admin  . calcium gluconate 1 g/ 50 mL sodium chloride IVPB  1 g Intravenous Once Tegeler, Gwenyth Allegra, MD      . Derrill Memo ON 06/08/2019] Chlorhexidine Gluconate Cloth 2 % PADS 6 each  6 each Topical Q0600 Roney Jaffe, MD       Current Outpatient Medications  Medication Sig Dispense Refill  . acetaminophen (TYLENOL) 650 MG CR tablet Take 1,300 mg by mouth every 8 (eight) hours as needed for pain.    Marland Kitchen amLODipine (NORVASC) 5 MG tablet Take 5 mg by mouth at bedtime.    . Ascorbic Acid (VITAMIN C) 1000 MG tablet Take 1,000 mg by mouth 2 (two) times a day.     Marland Kitchen atorvastatin (LIPITOR) 10 MG tablet Take 10 mg by mouth at bedtime.    . ferric citrate (AURYXIA) 1 GM 210 MG(Fe) tablet Take 420 mg by mouth 3 (three) times daily with meals.    . furosemide (LASIX) 40 MG tablet Take 1 tablet (40 mg total) by mouth every Tuesday, Thursday, Saturday, and Sunday. (Patient taking differently: Take 80 mg by mouth every Tuesday, Thursday, Saturday, and Sunday. ) 30 tablet 0  . labetalol (NORMODYNE) 200 MG tablet Take 200 mg by mouth 2 (two) times daily.    Marland Kitchen lidocaine-prilocaine (EMLA) cream Apply 1 application topically as directed. Prior to Dialysis    . multivitamin (RENA-VIT) TABS tablet Take 1 tablet by mouth at bedtime. 30 tablet 0  . Omega-3 Fatty Acids (FISH OIL) 1000 MG CAPS Take 1,000 mg by mouth 2 (two) times daily.     . polyethylene glycol (MIRALAX) 17 g packet Take 17 g by mouth daily as needed for moderate constipation. 14 each 0  . Turmeric 500 MG CAPS Take 500 mg by mouth daily.        ROS: As per HPI otherwise  negative.  Physical Exam: Vitals:   06/07/19 1200 06/07/19 1301 06/07/19 1330 06/07/19 1602  BP: (!) 234/130 (!) 155/127 (!) 166/127 101/73  Pulse: 78 63 (!) 57 60  Resp: (!) 27 (!) 22 19 20   Temp:      TempSrc:      SpO2: 99% 100% 100% 100%  Weight:      Height:         General: Eldely female, alert, nad  Head: NCAT sclera not icteric  Neck: Supple. No JVD No masses Lungs: CTA bilaterally without wheezes, rales, or rhonchi. Breathing is unlabored. Heart: RRR with S1 S2 Abdomen: soft NT + BS Lower extremities:without edema or ischemic changes, no open wounds  Neuro: A & O  X 3. Moves  all extremities spontaneously. Psych:  Responds to questions appropriately with a normal affect. Dialysis Access: LUE AVF +bruit   Labs: Basic Metabolic Panel: Recent Labs  Lab 06/07/19 1116  NA 134*  K 6.9*  CL 96*  CO2 14*  GLUCOSE 129*  BUN 100*  CREATININE 13.10*  CALCIUM 9.2   Liver Function Tests: Recent Labs  Lab 06/07/19 1116  AST 29  ALT <5  ALKPHOS 84  BILITOT 0.9  PROT 7.9  ALBUMIN 3.4*   Recent Labs  Lab 06/07/19 1116  LIPASE 29   No results for input(s): AMMONIA in the last 168 hours. CBC: Recent Labs  Lab 06/07/19 1116  WBC 16.1*  NEUTROABS 14.3*  HGB 12.5  HCT 40.8  MCV 103.6*  PLT 286   Cardiac Enzymes: No results for input(s): CKTOTAL, CKMB, CKMBINDEX, TROPONINI in the last 168 hours. CBG: No results for input(s): GLUCAP in the last 168 hours. Iron Studies: No results for input(s): IRON, TIBC, TRANSFERRIN, FERRITIN in the last 72 hours. Studies/Results: CT ABDOMEN PELVIS WO CONTRAST  Result Date: 06/07/2019 CLINICAL DATA:  Abdominal trauma syncope, fall EXAM: CT CHEST WITHOUT CONTRAST TECHNIQUE: Multidetector CT imaging of the chest was performed following the standard protocol without IV contrast. COMPARISON:  October 28, 2013 FINDINGS: Cardiovascular: There is mild cardiomegaly. No significant pericardial fluid/thickening. Great vessels are  normal in course and caliber. Limited evaluation of the vascular structures due to the lack of intravenous contrast. Scattered aortic atherosclerosis. Coronary artery calcifications are present. Mediastinum/Nodes: No pneumomediastinum. No large mediastinal hematoma. The trachea and esophagus are grossly unremarkable. Lungs/Pleura:Scattered areas of bronchiectasis with architectural distortion are seen predominantly within both lung apices and at the posterior lower lungs. Centrilobular emphysematous changes seen at both lung apices. No pneumothorax. No pleural effusion. Musculoskeletal: Nondisplaced fractures are seen through the anterior right fourth and fifth ribs. Abdomen/pelvis: Hepatobiliary: Homogeneous hepatic attenuation without traumatic injury. No focal lesion. Gallbladder physiologically distended, no calcified stone. No biliary dilatation. Pancreas: No evidence for traumatic injury. Portions are partially obscured by adjacent bowel loops and paucity of intra-abdominal fat. No ductal dilatation or inflammation. Spleen: Again noted is a 5 cm low-density lesion in the posterior spleen. Normal in size. Adrenals/Urinary Tract: No adrenal hemorrhage. Bilateral renal atrophy is noted. There is a partially visualized low-density lesions seen within the lower pole the left kidney measuring 1.2 cm. Bladder is physiologically distended without wall thickening. Stomach/Bowel: Suboptimally assessed without enteric contrast, allowing for this, no evidence of bowel injury. Stomach physiologically distended. There are no dilated or thickened small or large bowel loops. Moderate stool burden. Scattered colonic diverticula are noted. No evidence of mesenteric hematoma. No free air free fluid. Vascular/Lymphatic: No acute vascular injury. The abdominal aorta and IVC are intact. No evidence of retroperitoneal, abdominal, or pelvic adenopathy. Scattered aortic atherosclerosis is noted. Reproductive: No acute abnormality.  Partially calcified posterior uterine fibroid is seen. Other: No focal contusion or abnormality of the abdominal wall. A small fat containing anterior umbilical hernia is noted. Musculoskeletal: There is a comminuted mildly displaced left transoral femoral neck fracture. The femoral head is still well seated within the acetabulum. The patient is status post ORIF with plate and screw fixation of the posterior right acetabulum. There is a grade 1 anterolisthesis of L5 on S1. There is progression of endplate sclerosis and reactive changes with erosions at L5-S1. IMPRESSION: 1. No acute intrathoracic, abdominal, or pelvic injury, h wall a owever somewhat limited due to lack of intravenous contrast. 2. Nondisplaced fractures through  the right anterior fourth and fifth ribs. 3. Areas of chronic interstitial lung changes and scarring seen throughout both lungs with centrilobular emphysematous changes at the lung apices. 4. Diverticulosis without diverticulitis. 5.  Aortic Atherosclerosis (ICD10-I70.0). 6. Comminuted mildly displaced left transcervical femoral neck fracture. 7. Grade 1 anterolisthesis of L5 on S1 with advanced degenerative changes as on prior radiograph dating back to 2019. Electronically Signed   By: Prudencio Pair M.D.   On: 06/07/2019 13:16   DG Chest 1 View  Result Date: 06/07/2019 CLINICAL DATA:  Syncopal episode. EXAM: CHEST  1 VIEW COMPARISON:  04/08/2019 chest radiograph and prior. FINDINGS: Overlying telemetry wires obscure visualization of the chest. Bilateral upper lung scarring and right predominant volume loss, unchanged. Patchy and streaky bibasilar opacities likely reflect atelectasis. No pneumothorax or pleural effusion. Cardiomediastinal silhouette is grossly unchanged. Partially imaged left upper extremity vascular stent. Osteopenia and multilevel spondylosis. Aortic atherosclerotic calcifications. IMPRESSION: No active airspace disease. Biapical scarring with right upper lung volume  loss, grossly unchanged. Electronically Signed   By: Primitivo Gauze M.D.   On: 06/07/2019 12:44   DG Pelvis 1-2 Views  Result Date: 06/07/2019 CLINICAL DATA:  Syncope, fall, left hip pain EXAM: PELVIS - 1-2 VIEW; LEFT FEMUR 2 VIEWS COMPARISON:  None. FINDINGS: Osteopenia. There is a displaced transcervical fracture of the left femoral neck. No fracture or dislocation of the distal left femur. Plate and screw fixation of the right acetabulum. No acute displaced fracture or dislocation of the pelvis or proximal right femur in frontal view only. IMPRESSION: 1. There is a displaced transcervical fracture of the left femoral neck. 2.  No fracture or dislocation of the distal left femur. 3. Osteopenia, which limits sensitivity for additional fractures of the pelvis. Electronically Signed   By: Eddie Candle M.D.   On: 06/07/2019 12:32   CT Head Wo Contrast  Result Date: 06/07/2019 CLINICAL DATA:  Fall at home.  Head injury EXAM: CT HEAD WITHOUT CONTRAST CT CERVICAL SPINE WITHOUT CONTRAST TECHNIQUE: Multidetector CT imaging of the head and cervical spine was performed following the standard protocol without intravenous contrast. Multiplanar CT image reconstructions of the cervical spine were also generated. COMPARISON:  None. FINDINGS: CT HEAD FINDINGS Brain: Mild atrophy without hydrocephalus. Patchy white matter hypodensity diffusely compatible with chronic microvascular ischemia. No acute hemorrhage, mass, or infarction. Small interhemispheric lipomas are present along the falx. This is felt to be incidental. Corpus callosum normally formed. Vascular: Negative for hyperdense vessel Skull: Negative Sinuses/Orbits: Paranasal sinuses clear. Bilateral cataract extraction. Other: None CT CERVICAL SPINE FINDINGS Alignment: Normal alignment with straightening of the cervical lordosis Skull base and vertebrae: Negative for fracture Soft tissues and spinal canal: No soft tissue mass or edema. Disc levels: Disc  degeneration and spurring C4-5 and C5-6. Moderate right foraminal stenosis at C5-6 due to spurring. Mild disc degeneration and spurring on the left at C6-7. Upper chest: CT chest reported separately. Other: None IMPRESSION: 1. No acute intracranial abnormality 2. Negative for cervical spine fracture. Mild to moderate cervical spondylosis. Electronically Signed   By: Franchot Gallo M.D.   On: 06/07/2019 12:57   CT Chest Wo Contrast  Result Date: 06/07/2019 CLINICAL DATA:  Abdominal trauma syncope, fall EXAM: CT CHEST WITHOUT CONTRAST TECHNIQUE: Multidetector CT imaging of the chest was performed following the standard protocol without IV contrast. COMPARISON:  October 28, 2013 FINDINGS: Cardiovascular: There is mild cardiomegaly. No significant pericardial fluid/thickening. Great vessels are normal in course and caliber. Limited evaluation of the vascular  structures due to the lack of intravenous contrast. Scattered aortic atherosclerosis. Coronary artery calcifications are present. Mediastinum/Nodes: No pneumomediastinum. No large mediastinal hematoma. The trachea and esophagus are grossly unremarkable. Lungs/Pleura:Scattered areas of bronchiectasis with architectural distortion are seen predominantly within both lung apices and at the posterior lower lungs. Centrilobular emphysematous changes seen at both lung apices. No pneumothorax. No pleural effusion. Musculoskeletal: Nondisplaced fractures are seen through the anterior right fourth and fifth ribs. Abdomen/pelvis: Hepatobiliary: Homogeneous hepatic attenuation without traumatic injury. No focal lesion. Gallbladder physiologically distended, no calcified stone. No biliary dilatation. Pancreas: No evidence for traumatic injury. Portions are partially obscured by adjacent bowel loops and paucity of intra-abdominal fat. No ductal dilatation or inflammation. Spleen: Again noted is a 5 cm low-density lesion in the posterior spleen. Normal in size. Adrenals/Urinary  Tract: No adrenal hemorrhage. Bilateral renal atrophy is noted. There is a partially visualized low-density lesions seen within the lower pole the left kidney measuring 1.2 cm. Bladder is physiologically distended without wall thickening. Stomach/Bowel: Suboptimally assessed without enteric contrast, allowing for this, no evidence of bowel injury. Stomach physiologically distended. There are no dilated or thickened small or large bowel loops. Moderate stool burden. Scattered colonic diverticula are noted. No evidence of mesenteric hematoma. No free air free fluid. Vascular/Lymphatic: No acute vascular injury. The abdominal aorta and IVC are intact. No evidence of retroperitoneal, abdominal, or pelvic adenopathy. Scattered aortic atherosclerosis is noted. Reproductive: No acute abnormality. Partially calcified posterior uterine fibroid is seen. Other: No focal contusion or abnormality of the abdominal wall. A small fat containing anterior umbilical hernia is noted. Musculoskeletal: There is a comminuted mildly displaced left transoral femoral neck fracture. The femoral head is still well seated within the acetabulum. The patient is status post ORIF with plate and screw fixation of the posterior right acetabulum. There is a grade 1 anterolisthesis of L5 on S1. There is progression of endplate sclerosis and reactive changes with erosions at L5-S1. IMPRESSION: 1. No acute intrathoracic, abdominal, or pelvic injury, h wall a owever somewhat limited due to lack of intravenous contrast. 2. Nondisplaced fractures through the right anterior fourth and fifth ribs. 3. Areas of chronic interstitial lung changes and scarring seen throughout both lungs with centrilobular emphysematous changes at the lung apices. 4. Diverticulosis without diverticulitis. 5.  Aortic Atherosclerosis (ICD10-I70.0). 6. Comminuted mildly displaced left transcervical femoral neck fracture. 7. Grade 1 anterolisthesis of L5 on S1 with advanced  degenerative changes as on prior radiograph dating back to 2019. Electronically Signed   By: Prudencio Pair M.D.   On: 06/07/2019 13:16   CT Cervical Spine Wo Contrast  Result Date: 06/07/2019 CLINICAL DATA:  Fall at home.  Head injury EXAM: CT HEAD WITHOUT CONTRAST CT CERVICAL SPINE WITHOUT CONTRAST TECHNIQUE: Multidetector CT imaging of the head and cervical spine was performed following the standard protocol without intravenous contrast. Multiplanar CT image reconstructions of the cervical spine were also generated. COMPARISON:  None. FINDINGS: CT HEAD FINDINGS Brain: Mild atrophy without hydrocephalus. Patchy white matter hypodensity diffusely compatible with chronic microvascular ischemia. No acute hemorrhage, mass, or infarction. Small interhemispheric lipomas are present along the falx. This is felt to be incidental. Corpus callosum normally formed. Vascular: Negative for hyperdense vessel Skull: Negative Sinuses/Orbits: Paranasal sinuses clear. Bilateral cataract extraction. Other: None CT CERVICAL SPINE FINDINGS Alignment: Normal alignment with straightening of the cervical lordosis Skull base and vertebrae: Negative for fracture Soft tissues and spinal canal: No soft tissue mass or edema. Disc levels: Disc degeneration and spurring C4-5  and C5-6. Moderate right foraminal stenosis at C5-6 due to spurring. Mild disc degeneration and spurring on the left at C6-7. Upper chest: CT chest reported separately. Other: None IMPRESSION: 1. No acute intracranial abnormality 2. Negative for cervical spine fracture. Mild to moderate cervical spondylosis. Electronically Signed   By: Franchot Gallo M.D.   On: 06/07/2019 12:57   DG FEMUR MIN 2 VIEWS LEFT  Result Date: 06/07/2019 CLINICAL DATA:  Syncope, fall, left hip pain EXAM: PELVIS - 1-2 VIEW; LEFT FEMUR 2 VIEWS COMPARISON:  None. FINDINGS: Osteopenia. There is a displaced transcervical fracture of the left femoral neck. No fracture or dislocation of the distal  left femur. Plate and screw fixation of the right acetabulum. No acute displaced fracture or dislocation of the pelvis or proximal right femur in frontal view only. IMPRESSION: 1. There is a displaced transcervical fracture of the left femoral neck. 2.  No fracture or dislocation of the distal left femur. 3. Osteopenia, which limits sensitivity for additional fractures of the pelvis. Electronically Signed   By: Eddie Candle M.D.   On: 06/07/2019 12:32    Dialysis Orders:  [facility] MWF 4h 350/800 EDW 77kg 2K/2.25Ca UFP 4 AVF Heparin 2000 Mircera 200 (last 3/31)   Assessment/Plan: 1. Left femoral neck fracture s/p syncopal event at home.  Per primary/ortho.  2. Hyperkalemia 2/2 missed HD. Unable to cannulate fistula Monday at OP dialysis and again this afternoon.  Will order temporizing meds and follow potassium. Plan for IR TDC in am and HD once cathter placed.  3. ESRD -  HD MWF. Missed HD Monday d/t difficult cannulation of AVF. Has been referred for OP evaluation of fistula. Unable to cannulate fistula this afternoon. Plan for IR TDC and HD once catheter placed. Likely need vascular eval as well.  4. Hypertension/volume  - Hypertensive on arrival. Now controlled. On amlodipine 5, labetalol 200 bid. UF to EDW as tolerated.  5.  Anemia  - Hgb 12.5. No ESA needs.  6.  Metabolic bone disease -  Continue Auryixa binders/VDRA  7.  Nutrition - Renal diet/fluid restriction when eating.   Lynnda Child PA-C [facility] Pager [phone] 06/07/2019, 4:20 PM   Pt seen, examined and agree w A/P as above.  Kelly Splinter  MD 06/07/2019, 4:59 PM

## 2019-06-07 NOTE — ED Provider Notes (Signed)
[facility] EMERGENCY DEPARTMENT Provider Note   CSN: [id number] Arrival date & time: 06/07/19  1025     History Chief Complaint  Patient presents with   Fall   Missed Dialysis    Bianca Myers is a 74 y.o. female.  The history is provided by the patient and medical records. No language interpreter was used.  Loss of Consciousness Episode history:  Single Most recent episode:  Today Timing:  Rare Progression:  Unchanged Chronicity:  New Context: standing up   Witnessed: no   Relieved by:  Nothing Worsened by:  Nothing Ineffective treatments:  None tried Associated symptoms: chest pain, headaches, malaise/fatigue and shortness of breath   Associated symptoms: no confusion, no diaphoresis, no difficulty breathing, no dizziness, no fever, no nausea, no palpitations, no recent fall and no vomiting        Past Medical History:  Diagnosis Date   Anemia    Arthritis    Chronic kidney disease (CKD) stage G5/A1, glomerular filtration rate (GFR) less than or equal to 15 mL/min/1.73 square meter and albuminuria creatinine ratio less than 30 mg/g (HCC)    Depression    Diabetes mellitus (HCC)    Gout    History of anemia due to chronic kidney disease    History of degenerative disc disease    Hypertension    Pneumonia    Secondary hyperparathyroidism ([place])    Spondylisthesis     Patient Active Problem List   Diagnosis Date Noted   MSSA bacteremia 03/10/2019   Diabetes mellitus ([place]) 03/10/2019   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   Mild aortic stenosis 03/10/2019   HCAP (healthcare-associated pneumonia) 03/09/2019   Heart failure ([place]) 08/31/2018   Anemia of chronic kidney failure, stage 5 ([place]) 08/27/2018   Chronic depression 08/27/2018   Patient is Jehovah's Witness 08/27/2018   Essential hypertension    End-stage renal disease on hemodialysis ([place]) 12/10/2015    Past Surgical History:  Procedure Laterality Date    A/V FISTULAGRAM Left 12/16/2018   Procedure: A/V FISTULAGRAM;  Surgeon: Marty Heck, MD;  Location: [place] [facility];  Service: Cardiovascular;  Laterality: Left;   AV FISTULA PLACEMENT Left 08/24/2018   Procedure: ARTERIOVENOUS (AV) FISTULA CREATION;  Surgeon: Waynetta Sandy, MD;  Location: [place];  Service: Vascular;  Laterality: Left;   [place] Left 11/04/2018   Procedure: [place];  Surgeon: Waynetta Sandy, MD;  Location: [place];  Service: Vascular;  Laterality: Left;   COLONOSCOPY     HIP SURGERY     IR FLUORO GUIDE CV LINE RIGHT  08/21/2018   IR REMOVAL TUN CV CATH W/O FL  03/11/2019   IR US GUIDE VASC ACCESS RIGHT  08/21/2018   PERIPHERAL VASCULAR INTERVENTION Left 12/16/2018   Procedure: PERIPHERAL VASCULAR INTERVENTION;  Surgeon: Marty Heck, MD;  Location: [place] [facility];  Service: Cardiovascular;  Laterality: Left;  arm fistula     OB History   No obstetric history on file.     Family History  Family history unknown: Yes    Social History   Tobacco Use   Smoking status: Former Smoker    Quit date: 1973    Years since quitting: 48.2   Smokeless tobacco: Never Used  Substance Use Topics   Alcohol use: Not Currently   Drug use: Never    Home Medications Prior to Admission medications   Medication Sig Start Date End Date Taking? Authorizing Provider  acetaminophen (TYLENOL) 650 MG CR tablet Take 1,300 mg by mouth every 8 (eight) hours as needed for pain.    [provider]  amLODipine (NORVASC) 5 MG tablet Take 5 mg by mouth at bedtime.    [provider]  Ascorbic Acid (VITAMIN C) 1000 MG tablet Take 1,000 mg by mouth 2 (two) times a day.     [provider]  atorvastatin (LIPITOR) 10 MG tablet Take 10 mg by mouth at bedtime. 05/14/18   [provider]  ferric citrate (AURYXIA) 1 GM 210 MG(Fe) tablet Take 420 mg by mouth 3 (three)  times daily with meals.    [provider]  furosemide (LASIX) 40 MG tablet Take 1 tablet (40 mg total) by mouth every Tuesday, Thursday, Saturday, and Sunday. Patient taking differently: Take 80 mg by mouth every Tuesday, Thursday, Saturday, and Sunday.  09/04/18   Nita Sells, MD  labetalol (NORMODYNE) 200 MG tablet Take 200 mg by mouth 2 (two) times daily.    [provider]  lidocaine-prilocaine (EMLA) cream Apply 1 application topically as directed. Prior to Dialysis 02/11/19   [provider]  multivitamin (RENA-VIT) TABS tablet Take 1 tablet by mouth at bedtime. 08/27/18   Dhungel, Flonnie Overman, MD  Omega-3 Fatty Acids (FISH OIL) 1000 MG CAPS Take 1,000 mg by mouth 2 (two) times daily.     [provider]  polyethylene glycol (MIRALAX) 17 g packet Take 17 g by mouth daily as needed for moderate constipation. 08/27/18   Dhungel, Nishant, MD  Turmeric 500 MG CAPS Take 500 mg by mouth daily.     [provider]    Allergies    Patient has no known allergies.  Review of Systems   Review of Systems  Constitutional: Positive for malaise/fatigue. Negative for chills, diaphoresis, fatigue and fever.  HENT: Positive for congestion.   Respiratory: Positive for chest tightness and shortness of breath. Negative for cough and wheezing.   Cardiovascular: Positive for chest pain and syncope. Negative for palpitations and leg swelling.  Gastrointestinal: Negative for abdominal pain, constipation, diarrhea, nausea and vomiting.  Genitourinary: Negative for dysuria and flank pain.  Musculoskeletal: Positive for neck pain. Negative for back pain.  Skin: Negative for wound.  Neurological: Positive for syncope and headaches. Negative for dizziness, light-headedness and numbness.  Psychiatric/Behavioral: Negative for agitation and confusion.  All other systems reviewed and are negative.   Physical Exam Updated Vital Signs BP (!) 233/117 (BP Location: Left  Arm)    Pulse 80    Temp 97.9 F (36.6 C) (Oral)    Resp 20    Ht 5\' 7"  (1.702 m)    Wt 75.8 kg    SpO2 99%    BMI 26.16 kg/m   Physical Exam Vitals and nursing note reviewed.  Constitutional:      General: She is not in acute distress.    Appearance: She is well-developed. She is obese. She is not ill-appearing, toxic-appearing or diaphoretic.  HENT:     Head: Normocephalic and atraumatic.     Right Ear: External ear normal.     Left Ear: External ear normal.     Nose: Nose normal. No congestion or rhinorrhea.     Mouth/Throat:     Mouth: Mucous membranes are moist.     Pharynx: No oropharyngeal exudate or posterior oropharyngeal erythema.  Eyes:     Conjunctiva/sclera: Conjunctivae normal.     Pupils: Pupils are equal, round, and reactive to light.  Neck:  Cardiovascular:     Rate and Rhythm: Normal rate and regular rhythm.     Pulses: Normal pulses.     Heart sounds: Murmur present.  Pulmonary:     Effort: Pulmonary effort is normal. No respiratory distress.     Breath sounds: No stridor. Rales present. No wheezing or rhonchi.  Chest:     Chest wall: Tenderness present.    Abdominal:     General: Abdomen is flat. There is no distension.     Tenderness: There is no abdominal tenderness. There is no rebound.  Musculoskeletal:        General: Tenderness and deformity present.     Cervical back: Normal range of motion and neck supple. Tenderness present. Muscular tenderness present.       Legs:     Comments: Deformity and tenderness in the left hip.  No laceration..  Normal sensation and strength in the ankle and foot.  Good pulses distally.  Skin:    General: Skin is warm.     Findings: No erythema or rash.  Neurological:     General: No focal deficit present.     Mental Status: She is alert and oriented to person, place, and time.     Sensory: No sensory deficit.     Motor: No weakness or abnormal muscle tone.     Deep Tendon Reflexes: Reflexes are normal and  symmetric.     ED Results / Procedures / Treatments   Labs (all labs ordered are listed, but only abnormal results are displayed) Labs Reviewed  CBC WITH DIFFERENTIAL/PLATELET - Abnormal; Notable for the following components:      Result Value   WBC 16.1 (*)    MCV 103.6 (*)    RDW 18.4 (*)    nRBC 0.7 (*)    Neutro Abs 14.3 (*)    Abs Immature Granulocytes 0.14 (*)    All other components within normal limits  COMPREHENSIVE METABOLIC PANEL - Abnormal; Notable for the following components:   Sodium 134 (*)    Potassium 6.9 (*)    Chloride 96 (*)    CO2 14 (*)    Glucose, Bld 129 (*)    BUN 100 (*)    Creatinine, Ser 13.10 (*)    Albumin 3.4 (*)    GFR calc non Af Amer 2 (*)    GFR calc Af Amer 3 (*)    Anion gap 24 (*)    All other components within normal limits  TROPONIN I (HIGH SENSITIVITY) - Abnormal; Notable for the following components:   Troponin I (High Sensitivity) 56 (*)    All other components within normal limits  TROPONIN I (HIGH SENSITIVITY) - Abnormal; Notable for the following components:   Troponin I (High Sensitivity) 72 (*)    All other components within normal limits  RESPIRATORY PANEL BY RT PCR (FLU A&B, COVID)  LIPASE, BLOOD  LACTIC ACID, PLASMA  LACTIC ACID, PLASMA    EKG EKG Interpretation  Date/Time:  Tuesday June 07 2019 10:36:47 EDT Ventricular Rate:  81 PR Interval:    QRS Duration: 110 QT Interval:  432 QTC Calculation: 502 R Axis:   67 Text Interpretation: Sinus rhythm Incomplete left bundle branch block Prolonged QT interval When compared to prior, longer QTc. No sTEMI Confirmed by Antony Blackbird [id number]) on 06/07/2019 10:42:20 AM   Radiology CT ABDOMEN PELVIS WO CONTRAST  Result Date: 06/07/2019 CLINICAL DATA:  Abdominal trauma syncope, fall EXAM: CT CHEST WITHOUT CONTRAST TECHNIQUE: Multidetector CT  imaging of the chest was performed following the standard protocol without IV contrast. COMPARISON:  October 28, 2013 FINDINGS:  Cardiovascular: There is mild cardiomegaly. No significant pericardial fluid/thickening. Great vessels are normal in course and caliber. Limited evaluation of the vascular structures due to the lack of intravenous contrast. Scattered aortic atherosclerosis. Coronary artery calcifications are present. Mediastinum/Nodes: No pneumomediastinum. No large mediastinal hematoma. The trachea and esophagus are grossly unremarkable. Lungs/Pleura:Scattered areas of bronchiectasis with architectural distortion are seen predominantly within both lung apices and at the posterior lower lungs. Centrilobular emphysematous changes seen at both lung apices. No pneumothorax. No pleural effusion. Musculoskeletal: Nondisplaced fractures are seen through the anterior right fourth and fifth ribs. Abdomen/pelvis: Hepatobiliary: Homogeneous hepatic attenuation without traumatic injury. No focal lesion. Gallbladder physiologically distended, no calcified stone. No biliary dilatation. Pancreas: No evidence for traumatic injury. Portions are partially obscured by adjacent bowel loops and paucity of intra-abdominal fat. No ductal dilatation or inflammation. Spleen: Again noted is a 5 cm low-density lesion in the posterior spleen. Normal in size. Adrenals/Urinary Tract: No adrenal hemorrhage. Bilateral renal atrophy is noted. There is a partially visualized low-density lesions seen within the lower pole the left kidney measuring 1.2 cm. Bladder is physiologically distended without wall thickening. Stomach/Bowel: Suboptimally assessed without enteric contrast, allowing for this, no evidence of bowel injury. Stomach physiologically distended. There are no dilated or thickened small or large bowel loops. Moderate stool burden. Scattered colonic diverticula are noted. No evidence of mesenteric hematoma. No free air free fluid. Vascular/Lymphatic: No acute vascular injury. The abdominal aorta and IVC are intact. No evidence of retroperitoneal,  abdominal, or pelvic adenopathy. Scattered aortic atherosclerosis is noted. Reproductive: No acute abnormality. Partially calcified posterior uterine fibroid is seen. Other: No focal contusion or abnormality of the abdominal wall. A small fat containing anterior umbilical hernia is noted. Musculoskeletal: There is a comminuted mildly displaced left transoral femoral neck fracture. The femoral head is still well seated within the acetabulum. The patient is status post ORIF with plate and screw fixation of the posterior right acetabulum. There is a grade 1 anterolisthesis of L5 on S1. There is progression of endplate sclerosis and reactive changes with erosions at L5-S1. IMPRESSION: 1. No acute intrathoracic, abdominal, or pelvic injury, h wall a owever somewhat limited due to lack of intravenous contrast. 2. Nondisplaced fractures through the right anterior fourth and fifth ribs. 3. Areas of chronic interstitial lung changes and scarring seen throughout both lungs with centrilobular emphysematous changes at the lung apices. 4. Diverticulosis without diverticulitis. 5.  Aortic Atherosclerosis (ICD10-I70.0). 6. Comminuted mildly displaced left transcervical femoral neck fracture. 7. Grade 1 anterolisthesis of L5 on S1 with advanced degenerative changes as on prior radiograph dating back to 2019. Electronically Signed   By: Prudencio Pair M.D.   On: 06/07/2019 13:16   DG Chest 1 View  Result Date: 06/07/2019 CLINICAL DATA:  Syncopal episode. EXAM: CHEST  1 VIEW COMPARISON:  04/08/2019 chest radiograph and prior. FINDINGS: Overlying telemetry wires obscure visualization of the chest. Bilateral upper lung scarring and right predominant volume loss, unchanged. Patchy and streaky bibasilar opacities likely reflect atelectasis. No pneumothorax or pleural effusion. Cardiomediastinal silhouette is grossly unchanged. Partially imaged left upper extremity vascular stent. Osteopenia and multilevel spondylosis. Aortic  atherosclerotic calcifications. IMPRESSION: No active airspace disease. Biapical scarring with right upper lung volume loss, grossly unchanged. Electronically Signed   By: Primitivo Gauze M.D.   On: 06/07/2019 12:44   DG Pelvis 1-2 Views  Result Date: 06/07/2019 CLINICAL  DATA:  Syncope, fall, left hip pain EXAM: PELVIS - 1-2 VIEW; LEFT FEMUR 2 VIEWS COMPARISON:  None. FINDINGS: Osteopenia. There is a displaced transcervical fracture of the left femoral neck. No fracture or dislocation of the distal left femur. Plate and screw fixation of the right acetabulum. No acute displaced fracture or dislocation of the pelvis or proximal right femur in frontal view only. IMPRESSION: 1. There is a displaced transcervical fracture of the left femoral neck. 2.  No fracture or dislocation of the distal left femur. 3. Osteopenia, which limits sensitivity for additional fractures of the pelvis. Electronically Signed   By: Eddie Candle M.D.   On: 06/07/2019 12:32   CT Head Wo Contrast  Result Date: 06/07/2019 CLINICAL DATA:  Fall at home.  Head injury EXAM: CT HEAD WITHOUT CONTRAST CT CERVICAL SPINE WITHOUT CONTRAST TECHNIQUE: Multidetector CT imaging of the head and cervical spine was performed following the standard protocol without intravenous contrast. Multiplanar CT image reconstructions of the cervical spine were also generated. COMPARISON:  None. FINDINGS: CT HEAD FINDINGS Brain: Mild atrophy without hydrocephalus. Patchy white matter hypodensity diffusely compatible with chronic microvascular ischemia. No acute hemorrhage, mass, or infarction. Small interhemispheric lipomas are present along the falx. This is felt to be incidental. Corpus callosum normally formed. Vascular: Negative for hyperdense vessel Skull: Negative Sinuses/Orbits: Paranasal sinuses clear. Bilateral cataract extraction. Other: None CT CERVICAL SPINE FINDINGS Alignment: Normal alignment with straightening of the cervical lordosis Skull base and  vertebrae: Negative for fracture Soft tissues and spinal canal: No soft tissue mass or edema. Disc levels: Disc degeneration and spurring C4-5 and C5-6. Moderate right foraminal stenosis at C5-6 due to spurring. Mild disc degeneration and spurring on the left at C6-7. Upper chest: CT chest reported separately. Other: None IMPRESSION: 1. No acute intracranial abnormality 2. Negative for cervical spine fracture. Mild to moderate cervical spondylosis. Electronically Signed   By: Franchot Gallo M.D.   On: 06/07/2019 12:57   CT Chest Wo Contrast  Result Date: 06/07/2019 CLINICAL DATA:  Abdominal trauma syncope, fall EXAM: CT CHEST WITHOUT CONTRAST TECHNIQUE: Multidetector CT imaging of the chest was performed following the standard protocol without IV contrast. COMPARISON:  October 28, 2013 FINDINGS: Cardiovascular: There is mild cardiomegaly. No significant pericardial fluid/thickening. Great vessels are normal in course and caliber. Limited evaluation of the vascular structures due to the lack of intravenous contrast. Scattered aortic atherosclerosis. Coronary artery calcifications are present. Mediastinum/Nodes: No pneumomediastinum. No large mediastinal hematoma. The trachea and esophagus are grossly unremarkable. Lungs/Pleura:Scattered areas of bronchiectasis with architectural distortion are seen predominantly within both lung apices and at the posterior lower lungs. Centrilobular emphysematous changes seen at both lung apices. No pneumothorax. No pleural effusion. Musculoskeletal: Nondisplaced fractures are seen through the anterior right fourth and fifth ribs. Abdomen/pelvis: Hepatobiliary: Homogeneous hepatic attenuation without traumatic injury. No focal lesion. Gallbladder physiologically distended, no calcified stone. No biliary dilatation. Pancreas: No evidence for traumatic injury. Portions are partially obscured by adjacent bowel loops and paucity of intra-abdominal fat. No ductal dilatation or  inflammation. Spleen: Again noted is a 5 cm low-density lesion in the posterior spleen. Normal in size. Adrenals/Urinary Tract: No adrenal hemorrhage. Bilateral renal atrophy is noted. There is a partially visualized low-density lesions seen within the lower pole the left kidney measuring 1.2 cm. Bladder is physiologically distended without wall thickening. Stomach/Bowel: Suboptimally assessed without enteric contrast, allowing for this, no evidence of bowel injury. Stomach physiologically distended. There are no dilated or thickened small or large bowel  loops. Moderate stool burden. Scattered colonic diverticula are noted. No evidence of mesenteric hematoma. No free air free fluid. Vascular/Lymphatic: No acute vascular injury. The abdominal aorta and IVC are intact. No evidence of retroperitoneal, abdominal, or pelvic adenopathy. Scattered aortic atherosclerosis is noted. Reproductive: No acute abnormality. Partially calcified posterior uterine fibroid is seen. Other: No focal contusion or abnormality of the abdominal wall. A small fat containing anterior umbilical hernia is noted. Musculoskeletal: There is a comminuted mildly displaced left transoral femoral neck fracture. The femoral head is still well seated within the acetabulum. The patient is status post ORIF with plate and screw fixation of the posterior right acetabulum. There is a grade 1 anterolisthesis of L5 on S1. There is progression of endplate sclerosis and reactive changes with erosions at L5-S1. IMPRESSION: 1. No acute intrathoracic, abdominal, or pelvic injury, h wall a owever somewhat limited due to lack of intravenous contrast. 2. Nondisplaced fractures through the right anterior fourth and fifth ribs. 3. Areas of chronic interstitial lung changes and scarring seen throughout both lungs with centrilobular emphysematous changes at the lung apices. 4. Diverticulosis without diverticulitis. 5.  Aortic Atherosclerosis (ICD10-I70.0). 6. Comminuted  mildly displaced left transcervical femoral neck fracture. 7. Grade 1 anterolisthesis of L5 on S1 with advanced degenerative changes as on prior radiograph dating back to 2019. Electronically Signed   By: Prudencio Pair M.D.   On: 06/07/2019 13:16   CT Cervical Spine Wo Contrast  Result Date: 06/07/2019 CLINICAL DATA:  Fall at home.  Head injury EXAM: CT HEAD WITHOUT CONTRAST CT CERVICAL SPINE WITHOUT CONTRAST TECHNIQUE: Multidetector CT imaging of the head and cervical spine was performed following the standard protocol without intravenous contrast. Multiplanar CT image reconstructions of the cervical spine were also generated. COMPARISON:  None. FINDINGS: CT HEAD FINDINGS Brain: Mild atrophy without hydrocephalus. Patchy white matter hypodensity diffusely compatible with chronic microvascular ischemia. No acute hemorrhage, mass, or infarction. Small interhemispheric lipomas are present along the falx. This is felt to be incidental. Corpus callosum normally formed. Vascular: Negative for hyperdense vessel Skull: Negative Sinuses/Orbits: Paranasal sinuses clear. Bilateral cataract extraction. Other: None CT CERVICAL SPINE FINDINGS Alignment: Normal alignment with straightening of the cervical lordosis Skull base and vertebrae: Negative for fracture Soft tissues and spinal canal: No soft tissue mass or edema. Disc levels: Disc degeneration and spurring C4-5 and C5-6. Moderate right foraminal stenosis at C5-6 due to spurring. Mild disc degeneration and spurring on the left at C6-7. Upper chest: CT chest reported separately. Other: None IMPRESSION: 1. No acute intracranial abnormality 2. Negative for cervical spine fracture. Mild to moderate cervical spondylosis. Electronically Signed   By: Franchot Gallo M.D.   On: 06/07/2019 12:57   DG FEMUR MIN 2 VIEWS LEFT  Result Date: 06/07/2019 CLINICAL DATA:  Syncope, fall, left hip pain EXAM: PELVIS - 1-2 VIEW; LEFT FEMUR 2 VIEWS COMPARISON:  None. FINDINGS: Osteopenia.  There is a displaced transcervical fracture of the left femoral neck. No fracture or dislocation of the distal left femur. Plate and screw fixation of the right acetabulum. No acute displaced fracture or dislocation of the pelvis or proximal right femur in frontal view only. IMPRESSION: 1. There is a displaced transcervical fracture of the left femoral neck. 2.  No fracture or dislocation of the distal left femur. 3. Osteopenia, which limits sensitivity for additional fractures of the pelvis. Electronically Signed   By: Eddie Candle M.D.   On: 06/07/2019 12:32    Procedures Procedures (including critical care time)  CRITICAL CARE Performed by: Gwenyth Allegra Zakee Deerman Total critical care time: 45 minutes Critical care time was exclusive of separately billable procedures and treating other patients. Critical care was necessary to treat or prevent imminent or life-threatening deterioration. Critical care was time spent personally by me on the following activities: development of treatment plan with patient and/or surrogate as well as nursing, discussions with consultants, evaluation of patient's response to treatment, examination of patient, obtaining history from patient or surrogate, ordering and performing treatments and interventions, ordering and review of laboratory studies, ordering and review of radiographic studies, pulse oximetry and re-evaluation of patient's condition.   Medications Ordered in ED Medications  calcium gluconate 1 g/ 50 mL sodium chloride IVPB (has no administration in time range)  Chlorhexidine Gluconate Cloth 2 % PADS 6 each (has no administration in time range)  labetalol (NORMODYNE) injection 10 mg (10 mg Intravenous Given 06/07/19 1135)  HYDROmorphone (DILAUDID) injection 0.5 mg (0.5 mg Intravenous Given 06/07/19 1146)  HYDROmorphone (DILAUDID) injection 0.5 mg (0.5 mg Intravenous Given 06/07/19 1320)  albuterol (VENTOLIN HFA) 108 (90 Base) MCG/ACT inhaler 2 puff (2 puffs  Inhalation Given 06/07/19 1417)  insulin aspart (novoLOG) injection 5 Units (5 Units Intravenous Given 06/07/19 1417)    And  dextrose 50 % solution 50 mL (50 mLs Intravenous Given 06/07/19 1418)  HYDROmorphone (DILAUDID) injection 0.5 mg (0.5 mg Intravenous Given 06/07/19 1419)    ED Course  I have reviewed the triage vital signs and the nursing notes.  Pertinent labs & imaging results that were available during my care of the patient were reviewed by me and considered in my medical decision making (see chart for details).    MDM Rules/Calculators/A&P                      Seleste Tallman is a 74 y.o. female with a past medical history significant for ESRD on dialysis Monday Wednesday Friday, hypertension, heart failure, diabetes, sarcoidosis, and gout who presents with syncope with left hip injury, chest pain, headache, neck pain, elevated blood pressures, and missing dialysis yesterday.  Patient reports that she was feeling fairly well until she was was abdominal since yesterday.  She reports that her left upper arm fistula was "not working" so she was discharged from the dialysis center.  She was going to follow-up to have this examined but she reports that today after standing up, she passed out and syncopized and woke up on the floor with a deformed and very painful left hip as well as some right-sided chest pain.  She also had headache, neck pain, and EMS found her blood pressure to be in the 277O systolic.  She was brought to the emergency department for possible shortness of breath chest pain headache and syncope in the setting of elevated blood pressure and missed dialysis as well as the left hip injury.  On exam, patient does have tenderness on her right chest as well as rales in her lungs.  Abdomen was nontender.  Patient did have tenderness in her left hip with any movement.  She had normal sensation and pulse distally in her legs.  Patient had tenderness in her neck and occiput.  No  laceration seen.  Pupils are symmetric and reactive with normal extraocular movements.  Also some tenderness in her mid back.  I do feel a thrill and a pulse in her left fistula..  Also normal grip strength, sensation, and pulse distally in the arm.  Some tenderness in the  upper arm she reports is chronic.  Clinically I am concerned about the patient's elevated blood pressure, missing dialysis, and syncope leading to the fall.  I also concerned about traumatic injuries from the fall.  We will get CT imaging to further evaluate for injuries.  Patient was found to have 2 anterior right rib fractures as well as a left hip fracture.  Orthopedics was called for the hip fracture and they will see her.  Patient was also found to have hyperkalemia with a potassium of 6.9 and EKG showed some sharp T waves.  Spoke with nephrology who will see patient and offer dialysis.  Patient was given pain medicine as well as labetalol to help with her pain and blood pressure.  Blood pressure improved.  Patient still has the pain in her left hip but has improvement in the headache, chest tightness, and shortness of breath.  Patient will be admitted to medicine for further management.  Nephrology did not feel she needed ICU and feel she would be appropriate for stepdown.  They will see her for dialysis and will follow up on orthopedic recommendations.   Final Clinical Impression(s) / ED Diagnoses Final diagnoses:  Syncope, unspecified syncope type  Hyperkalemia  Closed fracture of left hip, initial encounter ([place])  Closed fracture of multiple ribs of right side, initial encounter    Clinical Impression: 1. Syncope, unspecified syncope type   2. Fracture   3. Hyperkalemia   4. Closed fracture of left hip, initial encounter ([place])   5. Closed fracture of multiple ribs of right side, initial encounter     Disposition: Admit  This note was prepared with assistance of Dragon voice recognition software. Occasional  wrong-word or sound-a-like substitutions may have occurred due to the inherent limitations of voice recognition software.     Casimir Barcellos, Gwenyth Allegra, MD 06/07/19 [phone]

## 2019-06-07 NOTE — ED Notes (Signed)
Patient transported to X-ray 

## 2019-06-07 NOTE — H&P (Signed)
History and Physical    Bianca Myers ERX:[id number] DOB: [birth year] DOA: 06/07/2019  Referring MD/NP/PA: EDP PCP:  Patient coming from: Home  Chief Complaint: Fall/HIp Pain  HPI: Bianca Myers is a 74 y.o. female with medical history significant for ESRD on hemodialysis, type 2 diabetes mellitus, hypertension, DJD, history of MSSA bacteremia in January was brought to the emergency room after a fall, possible syncopal event today. -Patient is a poor historian, slightly confused as well, attempted to call her son Bianca Myers without success today at the [phone] number, reportedly she went to her dialysis center in [place] yesterday and could not get dialysis due to issues with her AV fistula, subsequently went back home, remembers waking up this morning and walking to the kitchen, and then reports falling down, unclear if she had a syncopal event.  She denies loss of consciousness but again she is slightly confused. -She denies any chest pain, dyspnea, nausea or vomiting, work-up in the ED was notable for significant electrolyte abnormalities including potassium of 6.9, bicarb of 14, BUN of 100, creatinine of 13, white count of 16  Review of Systems: Limited given confusion  Past Medical History:  Diagnosis Date  . Anemia   . Arthritis   . Chronic kidney disease (CKD) stage G5/A1, glomerular filtration rate (GFR) less than or equal to 15 mL/min/1.73 square meter and albuminuria creatinine ratio less than 30 mg/g (HCC)   . Depression   . Diabetes mellitus ([place])   . Gout   . History of anemia due to chronic kidney disease   . History of degenerative disc disease   . Hypertension   . Pneumonia   . Secondary hyperparathyroidism ([place])   . Spondylisthesis     Past Surgical History:  Procedure Laterality Date  . A/V FISTULAGRAM Left 12/16/2018   Procedure: A/V FISTULAGRAM;  Surgeon: Marty Heck, MD;  Location: [facility];  Service: Cardiovascular;  Laterality:  Left;  . AV FISTULA PLACEMENT Left 08/24/2018   Procedure: ARTERIOVENOUS (AV) FISTULA CREATION;  Surgeon: Waynetta Sandy, MD;  Location: [facility];  Service: Vascular;  Laterality: Left;  . BASCILIC VEIN TRANSPOSITION Left 11/04/2018   Procedure: BASCILIC VEIN TRANSPOSITION SECOND STAGE;  Surgeon: Waynetta Sandy, MD;  Location: [facility];  Service: Vascular;  Laterality: Left;  . COLONOSCOPY    . HIP SURGERY    . IR FLUORO GUIDE CV LINE RIGHT  08/21/2018  . IR REMOVAL TUN CV CATH W/O FL  03/11/2019  . IR US GUIDE VASC ACCESS RIGHT  08/21/2018  . PERIPHERAL VASCULAR INTERVENTION Left 12/16/2018   Procedure: PERIPHERAL VASCULAR INTERVENTION;  Surgeon: Marty Heck, MD;  Location: [facility];  Service: Cardiovascular;  Laterality: Left;  arm fistula     reports that she quit smoking about 48 years ago. She has never used smokeless tobacco. She reports previous alcohol use. She reports that she does not use drugs.  No Known Allergies  Family History  Family history unknown: Yes     Prior to Admission medications   Medication Sig Start Date End Date Taking? Authorizing Provider  acetaminophen (TYLENOL) 650 MG CR tablet Take 1,300 mg by mouth every 8 (eight) hours as needed for pain.    [provider]  amLODipine (NORVASC) 5 MG tablet Take 5 mg by mouth at bedtime.    [provider]  Ascorbic Acid (VITAMIN C) 1000 MG tablet Take 1,000 mg by mouth 2 (two) times a day.  [provider]  atorvastatin (LIPITOR) 10 MG tablet Take 10 mg by mouth at bedtime. 05/14/18   [provider]  ferric citrate (AURYXIA) 1 GM 210 MG(Fe) tablet Take 420 mg by mouth 3 (three) times daily with meals.    [provider]  furosemide (LASIX) 40 MG tablet Take 1 tablet (40 mg total) by mouth every Tuesday, Thursday, Saturday, and Sunday. Patient taking differently: Take 80 mg by mouth every Tuesday, Thursday, Saturday, and Sunday.  09/04/18    Nita Sells, MD  labetalol (NORMODYNE) 200 MG tablet Take 200 mg by mouth 2 (two) times daily.    [provider]  lidocaine-prilocaine (EMLA) cream Apply 1 application topically as directed. Prior to Dialysis 02/11/19   [provider]  multivitamin (RENA-VIT) TABS tablet Take 1 tablet by mouth at bedtime. 08/27/18   Dhungel, Flonnie Overman, MD  Omega-3 Fatty Acids (FISH OIL) 1000 MG CAPS Take 1,000 mg by mouth 2 (two) times daily.     [provider]  polyethylene glycol (MIRALAX) 17 g packet Take 17 g by mouth daily as needed for moderate constipation. 08/27/18   Dhungel, Nishant, MD  Turmeric 500 MG CAPS Take 500 mg by mouth daily.     [provider]    Physical Exam: Vitals:   06/07/19 1200 06/07/19 1301 06/07/19 1330 06/07/19 1602  BP: (!) 234/130 (!) 155/127 (!) 166/127 101/73  Pulse: 78 63 (!) 57 60  Resp: (!) 27 (!) 22 19 20   Temp:      TempSrc:      SpO2: 99% 100% 100% 100%  Weight:      Height:          Constitutional: Pleasant elderly female, laying in bed, awake alert oriented to self, partly to place, mild confusion Vitals:   06/07/19 1200 06/07/19 1301 06/07/19 1330 06/07/19 1602  BP: (!) 234/130 (!) 155/127 (!) 166/127 101/73  Pulse: 78 63 (!) 57 60  Resp: (!) 27 (!) 22 19 20   Temp:      TempSrc:      SpO2: 99% 100% 100% 100%  Weight:      Height:      HEENT, positive pallor, no icterus Respiratory: Decreased breath sounds the bases Cardiovascular: S1-S2, regular rate rhythm Abdomen: soft, non tender, Bowel sounds positive.  Musculoskeletal: No joint deformity upper and lower extremities. Ext: Trace edema, left leg l shortened and externally rotated Skin: No obvious rashes on exposed skin Psychiatric: Poor insight and judgment  Labs on Admission: I have personally reviewed following labs and imaging studies  CBC: Recent Labs  Lab 06/07/19 1116  WBC 16.1*  NEUTROABS 14.3*  HGB 12.5  HCT 40.8  MCV 103.6*  PLT  109   Basic Metabolic Panel: Recent Labs  Lab 06/07/19 1116  NA 134*  K 6.9*  CL 96*  CO2 14*  GLUCOSE 129*  BUN 100*  CREATININE 13.10*  CALCIUM 9.2   GFR: Estimated Creatinine Clearance: 4.1 mL/min (A) (by C-G formula based on SCr of 13.1 mg/dL (H)). Liver Function Tests: Recent Labs  Lab 06/07/19 1116  AST 29  ALT <5  ALKPHOS 84  BILITOT 0.9  PROT 7.9  ALBUMIN 3.4*   Recent Labs  Lab 06/07/19 1116  LIPASE 29   No results for input(s): AMMONIA in the last 168 hours. Coagulation Profile: No results for input(s): INR, PROTIME in the last 168 hours. Cardiac Enzymes: No results for input(s): CKTOTAL, CKMB, CKMBINDEX, TROPONINI in the last 168 hours. BNP (  last 3 results) No results for input(s): PROBNP in the last 8760 hours. HbA1C: No results for input(s): HGBA1C in the last 72 hours. CBG: No results for input(s): GLUCAP in the last 168 hours. Lipid Profile: No results for input(s): CHOL, HDL, LDLCALC, TRIG, CHOLHDL, LDLDIRECT in the last 72 hours. Thyroid Function Tests: No results for input(s): TSH, T4TOTAL, FREET4, T3FREE, THYROIDAB in the last 72 hours. Anemia Panel: No results for input(s): VITAMINB12, FOLATE, FERRITIN, TIBC, IRON, RETICCTPCT in the last 72 hours. Urine analysis:    Component Value Date/Time   COLORURINE YELLOW 03/09/2019 1005   APPEARANCEUR CLOUDY (A) 03/09/2019 1005   LABSPEC 1.015 03/09/2019 1005   PHURINE 6.5 03/09/2019 1005   GLUCOSEU NEGATIVE 03/09/2019 1005   HGBUR SMALL (A) 03/09/2019 1005   BILIRUBINUR NEGATIVE 03/09/2019 1005   KETONESUR NEGATIVE 03/09/2019 1005   PROTEINUR >300 (A) 03/09/2019 1005   NITRITE NEGATIVE 03/09/2019 1005   LEUKOCYTESUR SMALL (A) 03/09/2019 1005   Sepsis Labs: @LABRCNTIP (procalcitonin:4,lacticidven:4) ) Recent Results (from the past 240 hour(s))  Respiratory Panel by RT PCR (Flu A&B, Covid) - Nasopharyngeal Swab     Status: None   Collection Time: 06/07/19  1:03 PM   Specimen:  Nasopharyngeal Swab  Result Value Ref Range Status   SARS Coronavirus 2 by RT PCR NEGATIVE NEGATIVE Final    Comment: (NOTE) SARS-CoV-2 target nucleic acids are NOT DETECTED. The SARS-CoV-2 RNA is generally detectable in upper respiratoy specimens during the acute phase of infection. The lowest concentration of SARS-CoV-2 viral copies this assay can detect is 131 copies/mL. A negative result does not preclude SARS-Cov-2 infection and should not be used as the sole basis for treatment or other patient management decisions. A negative result may occur with  improper specimen collection/handling, submission of specimen other than nasopharyngeal swab, presence of viral mutation(s) within the areas targeted by this assay, and inadequate number of viral copies (<131 copies/mL). A negative result must be combined with clinical observations, patient history, and epidemiological information. The expected result is Negative. Fact Sheet for Patients:  PinkCheek.be Fact Sheet for Healthcare Providers:  GravelBags.it This test is not yet ap proved or cleared by the [place] FDA and  has been authorized for detection and/or diagnosis of SARS-CoV-2 by FDA under an Emergency Use Authorization (EUA). This EUA will remain  in effect (meaning this test can be used) for the duration of the COVID-19 declaration under Section 564(b)(1) of the Act, 21 U.S.C. section 360bbb-3(b)(1), unless the authorization is terminated or revoked sooner.    Influenza A by PCR NEGATIVE NEGATIVE Final   Influenza B by PCR NEGATIVE NEGATIVE Final    Comment: (NOTE) The Xpert Xpress SARS-CoV-2/FLU/RSV assay is intended as an aid in  the diagnosis of influenza from Nasopharyngeal swab specimens and  should not be used as a sole basis for treatment. Nasal washings and  aspirates are unacceptable for Xpert Xpress SARS-CoV-2/FLU/RSV  testing. Fact Sheet for  Patients: PinkCheek.be Fact Sheet for Healthcare Providers: GravelBags.it This test is not yet approved or cleared by the [place] FDA and  has been authorized for detection and/or diagnosis of SARS-CoV-2 by  FDA under an Emergency Use Authorization (EUA). This EUA will remain  in effect (meaning this test can be used) for the duration of the  Covid-19 declaration under Section 564(b)(1) of the Act, 21  U.S.C. section 360bbb-3(b)(1), unless the authorization is  terminated or revoked. Performed at [facility], [place] [address], [place], [place] [address]  Radiological Exams on Admission: CT ABDOMEN PELVIS WO CONTRAST  Result Date: 06/07/2019 CLINICAL DATA:  Abdominal trauma syncope, fall EXAM: CT CHEST WITHOUT CONTRAST TECHNIQUE: Multidetector CT imaging of the chest was performed following the standard protocol without IV contrast. COMPARISON:  October 28, 2013 FINDINGS: Cardiovascular: There is mild cardiomegaly. No significant pericardial fluid/thickening. Great vessels are normal in course and caliber. Limited evaluation of the vascular structures due to the lack of intravenous contrast. Scattered aortic atherosclerosis. Coronary artery calcifications are present. Mediastinum/Nodes: No pneumomediastinum. No large mediastinal hematoma. The trachea and esophagus are grossly unremarkable. Lungs/Pleura:Scattered areas of bronchiectasis with architectural distortion are seen predominantly within both lung apices and at the posterior lower lungs. Centrilobular emphysematous changes seen at both lung apices. No pneumothorax. No pleural effusion. Musculoskeletal: Nondisplaced fractures are seen through the anterior right fourth and fifth ribs. Abdomen/pelvis: Hepatobiliary: Homogeneous hepatic attenuation without traumatic injury. No focal lesion. Gallbladder physiologically distended, no calcified stone. No biliary dilatation.  Pancreas: No evidence for traumatic injury. Portions are partially obscured by adjacent bowel loops and paucity of intra-abdominal fat. No ductal dilatation or inflammation. Spleen: Again noted is a 5 cm low-density lesion in the posterior spleen. Normal in size. Adrenals/Urinary Tract: No adrenal hemorrhage. Bilateral renal atrophy is noted. There is a partially visualized low-density lesions seen within the lower pole the left kidney measuring 1.2 cm. Bladder is physiologically distended without wall thickening. Stomach/Bowel: Suboptimally assessed without enteric contrast, allowing for this, no evidence of bowel injury. Stomach physiologically distended. There are no dilated or thickened small or large bowel loops. Moderate stool burden. Scattered colonic diverticula are noted. No evidence of mesenteric hematoma. No free air free fluid. Vascular/Lymphatic: No acute vascular injury. The abdominal aorta and IVC are intact. No evidence of retroperitoneal, abdominal, or pelvic adenopathy. Scattered aortic atherosclerosis is noted. Reproductive: No acute abnormality. Partially calcified posterior uterine fibroid is seen. Other: No focal contusion or abnormality of the abdominal wall. A small fat containing anterior umbilical hernia is noted. Musculoskeletal: There is a comminuted mildly displaced left transoral femoral neck fracture. The femoral head is still well seated within the acetabulum. The patient is status post ORIF with plate and screw fixation of the posterior right acetabulum. There is a grade 1 anterolisthesis of L5 on S1. There is progression of endplate sclerosis and reactive changes with erosions at L5-S1. IMPRESSION: 1. No acute intrathoracic, abdominal, or pelvic injury, h wall a owever somewhat limited due to lack of intravenous contrast. 2. Nondisplaced fractures through the right anterior fourth and fifth ribs. 3. Areas of chronic interstitial lung changes and scarring seen throughout both lungs  with centrilobular emphysematous changes at the lung apices. 4. Diverticulosis without diverticulitis. 5.  Aortic Atherosclerosis (ICD10-I70.0). 6. Comminuted mildly displaced left transcervical femoral neck fracture. 7. Grade 1 anterolisthesis of L5 on S1 with advanced degenerative changes as on prior radiograph dating back to 2019. Electronically Signed   By: Prudencio Pair M.D.   On: 06/07/2019 13:16   DG Chest 1 View  Result Date: 06/07/2019 CLINICAL DATA:  Syncopal episode. EXAM: CHEST  1 VIEW COMPARISON:  04/08/2019 chest radiograph and prior. FINDINGS: Overlying telemetry wires obscure visualization of the chest. Bilateral upper lung scarring and right predominant volume loss, unchanged. Patchy and streaky bibasilar opacities likely reflect atelectasis. No pneumothorax or pleural effusion. Cardiomediastinal silhouette is grossly unchanged. Partially imaged left upper extremity vascular stent. Osteopenia and multilevel spondylosis. Aortic atherosclerotic calcifications. IMPRESSION: No active airspace disease. Biapical scarring with right upper lung volume loss,  grossly unchanged. Electronically Signed   By: Primitivo Gauze M.D.   On: 06/07/2019 12:44   DG Pelvis 1-2 Views  Result Date: 06/07/2019 CLINICAL DATA:  Syncope, fall, left hip pain EXAM: PELVIS - 1-2 VIEW; LEFT FEMUR 2 VIEWS COMPARISON:  None. FINDINGS: Osteopenia. There is a displaced transcervical fracture of the left femoral neck. No fracture or dislocation of the distal left femur. Plate and screw fixation of the right acetabulum. No acute displaced fracture or dislocation of the pelvis or proximal right femur in frontal view only. IMPRESSION: 1. There is a displaced transcervical fracture of the left femoral neck. 2.  No fracture or dislocation of the distal left femur. 3. Osteopenia, which limits sensitivity for additional fractures of the pelvis. Electronically Signed   By: Eddie Candle M.D.   On: 06/07/2019 12:32   CT Head Wo  Contrast  Result Date: 06/07/2019 CLINICAL DATA:  Fall at home.  Head injury EXAM: CT HEAD WITHOUT CONTRAST CT CERVICAL SPINE WITHOUT CONTRAST TECHNIQUE: Multidetector CT imaging of the head and cervical spine was performed following the standard protocol without intravenous contrast. Multiplanar CT image reconstructions of the cervical spine were also generated. COMPARISON:  None. FINDINGS: CT HEAD FINDINGS Brain: Mild atrophy without hydrocephalus. Patchy white matter hypodensity diffusely compatible with chronic microvascular ischemia. No acute hemorrhage, mass, or infarction. Small interhemispheric lipomas are present along the falx. This is felt to be incidental. Corpus callosum normally formed. Vascular: Negative for hyperdense vessel Skull: Negative Sinuses/Orbits: Paranasal sinuses clear. Bilateral cataract extraction. Other: None CT CERVICAL SPINE FINDINGS Alignment: Normal alignment with straightening of the cervical lordosis Skull base and vertebrae: Negative for fracture Soft tissues and spinal canal: No soft tissue mass or edema. Disc levels: Disc degeneration and spurring C4-5 and C5-6. Moderate right foraminal stenosis at C5-6 due to spurring. Mild disc degeneration and spurring on the left at C6-7. Upper chest: CT chest reported separately. Other: None IMPRESSION: 1. No acute intracranial abnormality 2. Negative for cervical spine fracture. Mild to moderate cervical spondylosis. Electronically Signed   By: Franchot Gallo M.D.   On: 06/07/2019 12:57   CT Chest Wo Contrast  Result Date: 06/07/2019 CLINICAL DATA:  Abdominal trauma syncope, fall EXAM: CT CHEST WITHOUT CONTRAST TECHNIQUE: Multidetector CT imaging of the chest was performed following the standard protocol without IV contrast. COMPARISON:  October 28, 2013 FINDINGS: Cardiovascular: There is mild cardiomegaly. No significant pericardial fluid/thickening. Great vessels are normal in course and caliber. Limited evaluation of the vascular  structures due to the lack of intravenous contrast. Scattered aortic atherosclerosis. Coronary artery calcifications are present. Mediastinum/Nodes: No pneumomediastinum. No large mediastinal hematoma. The trachea and esophagus are grossly unremarkable. Lungs/Pleura:Scattered areas of bronchiectasis with architectural distortion are seen predominantly within both lung apices and at the posterior lower lungs. Centrilobular emphysematous changes seen at both lung apices. No pneumothorax. No pleural effusion. Musculoskeletal: Nondisplaced fractures are seen through the anterior right fourth and fifth ribs. Abdomen/pelvis: Hepatobiliary: Homogeneous hepatic attenuation without traumatic injury. No focal lesion. Gallbladder physiologically distended, no calcified stone. No biliary dilatation. Pancreas: No evidence for traumatic injury. Portions are partially obscured by adjacent bowel loops and paucity of intra-abdominal fat. No ductal dilatation or inflammation. Spleen: Again noted is a 5 cm low-density lesion in the posterior spleen. Normal in size. Adrenals/Urinary Tract: No adrenal hemorrhage. Bilateral renal atrophy is noted. There is a partially visualized low-density lesions seen within the lower pole the left kidney measuring 1.2 cm. Bladder is physiologically distended without wall thickening.  Stomach/Bowel: Suboptimally assessed without enteric contrast, allowing for this, no evidence of bowel injury. Stomach physiologically distended. There are no dilated or thickened small or large bowel loops. Moderate stool burden. Scattered colonic diverticula are noted. No evidence of mesenteric hematoma. No free air free fluid. Vascular/Lymphatic: No acute vascular injury. The abdominal aorta and IVC are intact. No evidence of retroperitoneal, abdominal, or pelvic adenopathy. Scattered aortic atherosclerosis is noted. Reproductive: No acute abnormality. Partially calcified posterior uterine fibroid is seen. Other: No  focal contusion or abnormality of the abdominal wall. A small fat containing anterior umbilical hernia is noted. Musculoskeletal: There is a comminuted mildly displaced left transoral femoral neck fracture. The femoral head is still well seated within the acetabulum. The patient is status post ORIF with plate and screw fixation of the posterior right acetabulum. There is a grade 1 anterolisthesis of L5 on S1. There is progression of endplate sclerosis and reactive changes with erosions at L5-S1. IMPRESSION: 1. No acute intrathoracic, abdominal, or pelvic injury, h wall a owever somewhat limited due to lack of intravenous contrast. 2. Nondisplaced fractures through the right anterior fourth and fifth ribs. 3. Areas of chronic interstitial lung changes and scarring seen throughout both lungs with centrilobular emphysematous changes at the lung apices. 4. Diverticulosis without diverticulitis. 5.  Aortic Atherosclerosis (ICD10-I70.0). 6. Comminuted mildly displaced left transcervical femoral neck fracture. 7. Grade 1 anterolisthesis of L5 on S1 with advanced degenerative changes as on prior radiograph dating back to 2019. Electronically Signed   By: Prudencio Pair M.D.   On: 06/07/2019 13:16   CT Cervical Spine Wo Contrast  Result Date: 06/07/2019 CLINICAL DATA:  Fall at home.  Head injury EXAM: CT HEAD WITHOUT CONTRAST CT CERVICAL SPINE WITHOUT CONTRAST TECHNIQUE: Multidetector CT imaging of the head and cervical spine was performed following the standard protocol without intravenous contrast. Multiplanar CT image reconstructions of the cervical spine were also generated. COMPARISON:  None. FINDINGS: CT HEAD FINDINGS Brain: Mild atrophy without hydrocephalus. Patchy white matter hypodensity diffusely compatible with chronic microvascular ischemia. No acute hemorrhage, mass, or infarction. Small interhemispheric lipomas are present along the falx. This is felt to be incidental. Corpus callosum normally formed.  Vascular: Negative for hyperdense vessel Skull: Negative Sinuses/Orbits: Paranasal sinuses clear. Bilateral cataract extraction. Other: None CT CERVICAL SPINE FINDINGS Alignment: Normal alignment with straightening of the cervical lordosis Skull base and vertebrae: Negative for fracture Soft tissues and spinal canal: No soft tissue mass or edema. Disc levels: Disc degeneration and spurring C4-5 and C5-6. Moderate right foraminal stenosis at C5-6 due to spurring. Mild disc degeneration and spurring on the left at C6-7. Upper chest: CT chest reported separately. Other: None IMPRESSION: 1. No acute intracranial abnormality 2. Negative for cervical spine fracture. Mild to moderate cervical spondylosis. Electronically Signed   By: Franchot Gallo M.D.   On: 06/07/2019 12:57   DG FEMUR MIN 2 VIEWS LEFT  Result Date: 06/07/2019 CLINICAL DATA:  Syncope, fall, left hip pain EXAM: PELVIS - 1-2 VIEW; LEFT FEMUR 2 VIEWS COMPARISON:  None. FINDINGS: Osteopenia. There is a displaced transcervical fracture of the left femoral neck. No fracture or dislocation of the distal left femur. Plate and screw fixation of the right acetabulum. No acute displaced fracture or dislocation of the pelvis or proximal right femur in frontal view only. IMPRESSION: 1. There is a displaced transcervical fracture of the left femoral neck. 2.  No fracture or dislocation of the distal left femur. 3. Osteopenia, which limits sensitivity for additional fractures  of the pelvis. Electronically Signed   By: Eddie Candle M.D.   On: 06/07/2019 12:32    EKG: Independently reviewed.  Sinus rhythm, interventricular conduction delay noted  Assessment/Plan Active Problems:  Fall/Hip Fracture -Unclear if she had a true syncopal event versus mechanical fall, patient denies loss of consciousness to me, unable to reach son this evening -Significant hyperkalemia with mild intraventricular conduction delay and abnormal QTC, arrhythmia is a possibility given  severity of hyperkalemia -Monitor on telemetry, check magnesium as well -Treat potassium as noted below and repeat in a.m. -Repeat EKG in a.m. -Orthopedics consulted and following  Hyperkalemia ESRD Uremia -Last dialysis on 4/2, missed HD treatment 4/5 with access issues -Has significant hyperkalemia and uremia, suspect under dialysis on previous treatments as well -Given calcium gluconate, Kayexalate and Lokelma today, recheck BMP in a.m., after HD -Monitor on telemetry, nephrology consulting, plan for dialysis  Metabolic encephalopathy -Mild confusion, suspect this is secondary to uremia, monitor with HD  Leukocytosis -Could be stress demargination, afebrile, given history of prior MSSA bacteremia will check blood cultures  Mildly elevated HS troponin -Denies chest pain, likely secondary to ESRD  Secondary hyper parathyroidism -Per renal  DVT prophylaxis: Heparin subcutaneous Code Status: Presumed full code Family Communication: No family at bedside, attempted to call patient's son Bianca Myers this evening without success Disposition Plan: May need rehab Consults called: Orthopedics and renal Admission status: Inpatient  Domenic Polite MD [facility]  06/07/2019, 5:14 PM

## 2019-06-07 NOTE — ED Triage Notes (Addendum)
Patient arrives via [facility] due to having fall at home. Per ems, the patient fell while walking in her home, then he son called ems. Patient arrives with c/o of pain to her left hip/groin area. Given 166mcg of Fentanyl en route. Patient rated pain a 10/10 on arrival.  The patient was also unable to complete dialysis yesterday due to her shunt not working.

## 2019-06-07 NOTE — ED Notes (Signed)
Patient to xray.

## 2019-06-07 NOTE — Consult Note (Signed)
Reason for Consult:Left hip fx Referring Physician: C Tegeler  Bianca Myers is an 74 y.o. female.  HPI: Bianca Myers was at home when she became faint and had a syncopal event and fell. When she came to her left hip was hurting badly and she could not get up. She was brought to the ED by EMS where x-rays showed a hip fx and orthopedic surgery was consulted. She lives with her son and does not typically use any assistive devices to ambulate.  Past Medical History:  Diagnosis Date  . Anemia   . Arthritis   . Chronic kidney disease (CKD) stage G5/A1, glomerular filtration rate (GFR) less than or equal to 15 mL/min/1.73 square meter and albuminuria creatinine ratio less than 30 mg/g (HCC)   . Depression   . Diabetes mellitus ([place])   . Gout   . History of anemia due to chronic kidney disease   . History of degenerative disc disease   . Hypertension   . Pneumonia   . Secondary hyperparathyroidism ([place])   . Spondylisthesis     Past Surgical History:  Procedure Laterality Date  . A/V FISTULAGRAM Left 12/16/2018   Procedure: A/V FISTULAGRAM;  Surgeon: Marty Heck, MD;  Location: [facility];  Service: Cardiovascular;  Laterality: Left;  . AV FISTULA PLACEMENT Left 08/24/2018   Procedure: ARTERIOVENOUS (AV) FISTULA CREATION;  Surgeon: Waynetta Sandy, MD;  Location: [facility];  Service: Vascular;  Laterality: Left;  . BASCILIC VEIN TRANSPOSITION Left 11/04/2018   Procedure: BASCILIC VEIN TRANSPOSITION SECOND STAGE;  Surgeon: Waynetta Sandy, MD;  Location: [facility];  Service: Vascular;  Laterality: Left;  . COLONOSCOPY    . HIP SURGERY    . IR FLUORO GUIDE CV LINE RIGHT  08/21/2018  . IR REMOVAL TUN CV CATH W/O FL  03/11/2019  . IR US GUIDE VASC ACCESS RIGHT  08/21/2018  . PERIPHERAL VASCULAR INTERVENTION Left 12/16/2018   Procedure: PERIPHERAL VASCULAR INTERVENTION;  Surgeon: Marty Heck, MD;  Location: [facility];  Service: Cardiovascular;   Laterality: Left;  arm fistula    Family History  Family history unknown: Yes    Social History:  reports that she quit smoking about 48 years ago. She has never used smokeless tobacco. She reports previous alcohol use. She reports that she does not use drugs.  Allergies: No Known Allergies  Medications: I have reviewed the patient's current medications.  Results for orders placed or performed during the hospital encounter of 06/07/19 (from the past 48 hour(s))  CBC with Differential     Status: Abnormal   Collection Time: 06/07/19 11:16 AM  Result Value Ref Range   WBC 16.1 (H) 4.0 - 10.5 K/uL   RBC 3.94 3.87 - 5.11 MIL/uL   Hemoglobin 12.5 12.0 - 15.0 g/dL   HCT 40.8 36.0 - 46.0 %   MCV 103.6 (H) 80.0 - 100.0 fL   MCH 31.7 26.0 - 34.0 pg   MCHC 30.6 30.0 - 36.0 g/dL   RDW 18.4 (H) 11.5 - 15.5 %   Platelets 286 150 - 400 K/uL   nRBC 0.7 (H) 0.0 - 0.2 %   Neutrophils Relative % 89 %   Neutro Abs 14.3 (H) 1.7 - 7.7 K/uL   Lymphocytes Relative 6 %   Lymphs Abs 1.0 0.7 - 4.0 K/uL   Monocytes Relative 4 %   Monocytes Absolute 0.6 0.1 - 1.0 K/uL   Eosinophils Relative 0 %   Eosinophils Absolute 0.0 0.0 -  0.5 K/uL   Basophils Relative 0 %   Basophils Absolute 0.0 0.0 - 0.1 K/uL   Immature Granulocytes 1 %   Abs Immature Granulocytes 0.14 (H) 0.00 - 0.07 K/uL    Comment: Performed at [facility], [place] [address] [place], [place] [address]  Lactic acid, plasma     Status: None   Collection Time: 06/07/19 11:49 AM  Result Value Ref Range   Lactic Acid, Venous 1.2 0.5 - 1.9 mmol/L    Comment: Performed at [facility] [address], [place], [place] [address]    CT ABDOMEN PELVIS WO CONTRAST  Result Date: 06/07/2019 CLINICAL DATA:  Abdominal trauma syncope, fall EXAM: CT CHEST WITHOUT CONTRAST TECHNIQUE: Multidetector CT imaging of the chest was performed following the standard protocol without IV contrast. COMPARISON:  October 28, 2013 FINDINGS: Cardiovascular:  There is mild cardiomegaly. No significant pericardial fluid/thickening. Great vessels are normal in course and caliber. Limited evaluation of the vascular structures due to the lack of intravenous contrast. Scattered aortic atherosclerosis. Coronary artery calcifications are present. Mediastinum/Nodes: No pneumomediastinum. No large mediastinal hematoma. The trachea and esophagus are grossly unremarkable. Lungs/Pleura:Scattered areas of bronchiectasis with architectural distortion are seen predominantly within both lung apices and at the posterior lower lungs. Centrilobular emphysematous changes seen at both lung apices. No pneumothorax. No pleural effusion. Musculoskeletal: Nondisplaced fractures are seen through the anterior right fourth and fifth ribs. Abdomen/pelvis: Hepatobiliary: Homogeneous hepatic attenuation without traumatic injury. No focal lesion. Gallbladder physiologically distended, no calcified stone. No biliary dilatation. Pancreas: No evidence for traumatic injury. Portions are partially obscured by adjacent bowel loops and paucity of intra-abdominal fat. No ductal dilatation or inflammation. Spleen: Again noted is a 5 cm low-density lesion in the posterior spleen. Normal in size. Adrenals/Urinary Tract: No adrenal hemorrhage. Bilateral renal atrophy is noted. There is a partially visualized low-density lesions seen within the lower pole the left kidney measuring 1.2 cm. Bladder is physiologically distended without wall thickening. Stomach/Bowel: Suboptimally assessed without enteric contrast, allowing for this, no evidence of bowel injury. Stomach physiologically distended. There are no dilated or thickened small or large bowel loops. Moderate stool burden. Scattered colonic diverticula are noted. No evidence of mesenteric hematoma. No free air free fluid. Vascular/Lymphatic: No acute vascular injury. The abdominal aorta and IVC are intact. No evidence of retroperitoneal, abdominal, or pelvic  adenopathy. Scattered aortic atherosclerosis is noted. Reproductive: No acute abnormality. Partially calcified posterior uterine fibroid is seen. Other: No focal contusion or abnormality of the abdominal wall. A small fat containing anterior umbilical hernia is noted. Musculoskeletal: There is a comminuted mildly displaced left transoral femoral neck fracture. The femoral head is still well seated within the acetabulum. The patient is status post ORIF with plate and screw fixation of the posterior right acetabulum. There is a grade 1 anterolisthesis of L5 on S1. There is progression of endplate sclerosis and reactive changes with erosions at L5-S1. IMPRESSION: 1. No acute intrathoracic, abdominal, or pelvic injury, h wall a owever somewhat limited due to lack of intravenous contrast. 2. Nondisplaced fractures through the right anterior fourth and fifth ribs. 3. Areas of chronic interstitial lung changes and scarring seen throughout both lungs with centrilobular emphysematous changes at the lung apices. 4. Diverticulosis without diverticulitis. 5.  Aortic Atherosclerosis (ICD10-I70.0). 6. Comminuted mildly displaced left transcervical femoral neck fracture. 7. Grade 1 anterolisthesis of L5 on S1 with advanced degenerative changes as on prior radiograph dating back to 2019. Electronically Signed   By: Kerby Moors  Avutu M.D.   On: 06/07/2019 13:16   DG Chest 1 View  Result Date: 06/07/2019 CLINICAL DATA:  Syncopal episode. EXAM: CHEST  1 VIEW COMPARISON:  04/08/2019 chest radiograph and prior. FINDINGS: Overlying telemetry wires obscure visualization of the chest. Bilateral upper lung scarring and right predominant volume loss, unchanged. Patchy and streaky bibasilar opacities likely reflect atelectasis. No pneumothorax or pleural effusion. Cardiomediastinal silhouette is grossly unchanged. Partially imaged left upper extremity vascular stent. Osteopenia and multilevel spondylosis. Aortic atherosclerotic calcifications.  IMPRESSION: No active airspace disease. Biapical scarring with right upper lung volume loss, grossly unchanged. Electronically Signed   By: Primitivo Gauze M.D.   On: 06/07/2019 12:44   DG Pelvis 1-2 Views  Result Date: 06/07/2019 CLINICAL DATA:  Syncope, fall, left hip pain EXAM: PELVIS - 1-2 VIEW; LEFT FEMUR 2 VIEWS COMPARISON:  None. FINDINGS: Osteopenia. There is a displaced transcervical fracture of the left femoral neck. No fracture or dislocation of the distal left femur. Plate and screw fixation of the right acetabulum. No acute displaced fracture or dislocation of the pelvis or proximal right femur in frontal view only. IMPRESSION: 1. There is a displaced transcervical fracture of the left femoral neck. 2.  No fracture or dislocation of the distal left femur. 3. Osteopenia, which limits sensitivity for additional fractures of the pelvis. Electronically Signed   By: Eddie Candle M.D.   On: 06/07/2019 12:32   CT Head Wo Contrast  Result Date: 06/07/2019 CLINICAL DATA:  Fall at home.  Head injury EXAM: CT HEAD WITHOUT CONTRAST CT CERVICAL SPINE WITHOUT CONTRAST TECHNIQUE: Multidetector CT imaging of the head and cervical spine was performed following the standard protocol without intravenous contrast. Multiplanar CT image reconstructions of the cervical spine were also generated. COMPARISON:  None. FINDINGS: CT HEAD FINDINGS Brain: Mild atrophy without hydrocephalus. Patchy white matter hypodensity diffusely compatible with chronic microvascular ischemia. No acute hemorrhage, mass, or infarction. Small interhemispheric lipomas are present along the falx. This is felt to be incidental. Corpus callosum normally formed. Vascular: Negative for hyperdense vessel Skull: Negative Sinuses/Orbits: Paranasal sinuses clear. Bilateral cataract extraction. Other: None CT CERVICAL SPINE FINDINGS Alignment: Normal alignment with straightening of the cervical lordosis Skull base and vertebrae: Negative for fracture  Soft tissues and spinal canal: No soft tissue mass or edema. Disc levels: Disc degeneration and spurring C4-5 and C5-6. Moderate right foraminal stenosis at C5-6 due to spurring. Mild disc degeneration and spurring on the left at C6-7. Upper chest: CT chest reported separately. Other: None IMPRESSION: 1. No acute intracranial abnormality 2. Negative for cervical spine fracture. Mild to moderate cervical spondylosis. Electronically Signed   By: Franchot Gallo M.D.   On: 06/07/2019 12:57   CT Chest Wo Contrast  Result Date: 06/07/2019 CLINICAL DATA:  Abdominal trauma syncope, fall EXAM: CT CHEST WITHOUT CONTRAST TECHNIQUE: Multidetector CT imaging of the chest was performed following the standard protocol without IV contrast. COMPARISON:  October 28, 2013 FINDINGS: Cardiovascular: There is mild cardiomegaly. No significant pericardial fluid/thickening. Great vessels are normal in course and caliber. Limited evaluation of the vascular structures due to the lack of intravenous contrast. Scattered aortic atherosclerosis. Coronary artery calcifications are present. Mediastinum/Nodes: No pneumomediastinum. No large mediastinal hematoma. The trachea and esophagus are grossly unremarkable. Lungs/Pleura:Scattered areas of bronchiectasis with architectural distortion are seen predominantly within both lung apices and at the posterior lower lungs. Centrilobular emphysematous changes seen at both lung apices. No pneumothorax. No pleural effusion. Musculoskeletal: Nondisplaced fractures are seen through the anterior right fourth  and fifth ribs. Abdomen/pelvis: Hepatobiliary: Homogeneous hepatic attenuation without traumatic injury. No focal lesion. Gallbladder physiologically distended, no calcified stone. No biliary dilatation. Pancreas: No evidence for traumatic injury. Portions are partially obscured by adjacent bowel loops and paucity of intra-abdominal fat. No ductal dilatation or inflammation. Spleen: Again noted is a 5  cm low-density lesion in the posterior spleen. Normal in size. Adrenals/Urinary Tract: No adrenal hemorrhage. Bilateral renal atrophy is noted. There is a partially visualized low-density lesions seen within the lower pole the left kidney measuring 1.2 cm. Bladder is physiologically distended without wall thickening. Stomach/Bowel: Suboptimally assessed without enteric contrast, allowing for this, no evidence of bowel injury. Stomach physiologically distended. There are no dilated or thickened small or large bowel loops. Moderate stool burden. Scattered colonic diverticula are noted. No evidence of mesenteric hematoma. No free air free fluid. Vascular/Lymphatic: No acute vascular injury. The abdominal aorta and IVC are intact. No evidence of retroperitoneal, abdominal, or pelvic adenopathy. Scattered aortic atherosclerosis is noted. Reproductive: No acute abnormality. Partially calcified posterior uterine fibroid is seen. Other: No focal contusion or abnormality of the abdominal wall. A small fat containing anterior umbilical hernia is noted. Musculoskeletal: There is a comminuted mildly displaced left transoral femoral neck fracture. The femoral head is still well seated within the acetabulum. The patient is status post ORIF with plate and screw fixation of the posterior right acetabulum. There is a grade 1 anterolisthesis of L5 on S1. There is progression of endplate sclerosis and reactive changes with erosions at L5-S1. IMPRESSION: 1. No acute intrathoracic, abdominal, or pelvic injury, h wall a owever somewhat limited due to lack of intravenous contrast. 2. Nondisplaced fractures through the right anterior fourth and fifth ribs. 3. Areas of chronic interstitial lung changes and scarring seen throughout both lungs with centrilobular emphysematous changes at the lung apices. 4. Diverticulosis without diverticulitis. 5.  Aortic Atherosclerosis (ICD10-I70.0). 6. Comminuted mildly displaced left transcervical femoral  neck fracture. 7. Grade 1 anterolisthesis of L5 on S1 with advanced degenerative changes as on prior radiograph dating back to 2019. Electronically Signed   By: Prudencio Pair M.D.   On: 06/07/2019 13:16   CT Cervical Spine Wo Contrast  Result Date: 06/07/2019 CLINICAL DATA:  Fall at home.  Head injury EXAM: CT HEAD WITHOUT CONTRAST CT CERVICAL SPINE WITHOUT CONTRAST TECHNIQUE: Multidetector CT imaging of the head and cervical spine was performed following the standard protocol without intravenous contrast. Multiplanar CT image reconstructions of the cervical spine were also generated. COMPARISON:  None. FINDINGS: CT HEAD FINDINGS Brain: Mild atrophy without hydrocephalus. Patchy white matter hypodensity diffusely compatible with chronic microvascular ischemia. No acute hemorrhage, mass, or infarction. Small interhemispheric lipomas are present along the falx. This is felt to be incidental. Corpus callosum normally formed. Vascular: Negative for hyperdense vessel Skull: Negative Sinuses/Orbits: Paranasal sinuses clear. Bilateral cataract extraction. Other: None CT CERVICAL SPINE FINDINGS Alignment: Normal alignment with straightening of the cervical lordosis Skull base and vertebrae: Negative for fracture Soft tissues and spinal canal: No soft tissue mass or edema. Disc levels: Disc degeneration and spurring C4-5 and C5-6. Moderate right foraminal stenosis at C5-6 due to spurring. Mild disc degeneration and spurring on the left at C6-7. Upper chest: CT chest reported separately. Other: None IMPRESSION: 1. No acute intracranial abnormality 2. Negative for cervical spine fracture. Mild to moderate cervical spondylosis. Electronically Signed   By: Franchot Gallo M.D.   On: 06/07/2019 12:57   DG FEMUR MIN 2 VIEWS LEFT  Result Date: 06/07/2019 CLINICAL  DATA:  Syncope, fall, left hip pain EXAM: PELVIS - 1-2 VIEW; LEFT FEMUR 2 VIEWS COMPARISON:  None. FINDINGS: Osteopenia. There is a displaced transcervical fracture of  the left femoral neck. No fracture or dislocation of the distal left femur. Plate and screw fixation of the right acetabulum. No acute displaced fracture or dislocation of the pelvis or proximal right femur in frontal view only. IMPRESSION: 1. There is a displaced transcervical fracture of the left femoral neck. 2.  No fracture or dislocation of the distal left femur. 3. Osteopenia, which limits sensitivity for additional fractures of the pelvis. Electronically Signed   By: Eddie Candle M.D.   On: 06/07/2019 12:32    Review of Systems  HENT: Negative for ear discharge, ear pain, hearing loss and tinnitus.   Eyes: Negative for photophobia and pain.  Respiratory: Negative for cough and shortness of breath.   Cardiovascular: Negative for chest pain.  Gastrointestinal: Negative for abdominal pain, nausea and vomiting.  Genitourinary: Negative for dysuria, flank pain, frequency and urgency.  Musculoskeletal: Positive for arthralgias (Left hip) and back pain. Negative for myalgias and neck pain.  Neurological: Positive for syncope. Negative for dizziness and headaches.  Hematological: Does not bruise/bleed easily.  Psychiatric/Behavioral: The patient is not nervous/anxious.    Blood pressure (!) 155/127, pulse 63, temperature 97.9 F (36.6 C), temperature source Oral, resp. rate (!) 22, height 5\' 7"  (1.702 m), weight 75.8 kg, SpO2 100 %. Physical Exam  Constitutional: She appears well-developed and well-nourished. No distress.  HENT:  Head: Normocephalic and atraumatic.  Eyes: Conjunctivae are normal. Right eye exhibits no discharge. Left eye exhibits no discharge. No scleral icterus.  Cardiovascular: Normal rate and regular rhythm.  Respiratory: Effort normal. No respiratory distress.  Musculoskeletal:     Cervical back: Normal range of motion.     Comments: LLE No traumatic wounds, ecchymosis, or rash  Mod TTP hip  No knee or ankle effusion, mild knee pain  Knee stable to varus/ valgus and  anterior/posterior stress  Sens DPN, SPN, TN intact  Motor EHL, ext, flex, evers 5/5  DP 1+, PT 1+, No significant edema  Neurological: She is alert.  Skin: Skin is warm and dry. She is not diaphoretic.  Psychiatric: She has a normal mood and affect. Her behavior is normal.    Assessment/Plan: Left hip fx -- Plan THA when cleared by IM. Surgeon to depend on timing for clearance. Will make NPO in case she's ready tomorrow and surgeon can be found though given usual late start time for OR on Wednesday it will be unlikely. Multiple medical problems including ESRD on HD, CKD, DM, gout, HTN, and hyperparathryroidism -- per IM who will admit. Will need workup for syncope, restoration of HD capability, and clearance for surgery.    Bianca Abu, PA-C Orthopedic Surgery [phone] 06/07/2019, 1:25 PM

## 2019-06-07 NOTE — ED Notes (Signed)
Patient is resting comfortably. 

## 2019-06-07 NOTE — ED Notes (Signed)
Patient to dialysis.

## 2019-06-08 ENCOUNTER — Inpatient Hospital Stay (HOSPITAL_COMMUNITY): Payer: Medicare HMO

## 2019-06-08 ENCOUNTER — Encounter (HOSPITAL_COMMUNITY): Payer: Self-pay | Admitting: Anesthesiology

## 2019-06-08 ENCOUNTER — Other Ambulatory Visit (HOSPITAL_COMMUNITY): Payer: Medicare HMO

## 2019-06-08 ENCOUNTER — Encounter (HOSPITAL_COMMUNITY): Payer: Self-pay | Admitting: Internal Medicine

## 2019-06-08 ENCOUNTER — Inpatient Hospital Stay (HOSPITAL_COMMUNITY)
Admit: 2019-06-08 | Discharge: 2019-06-08 | Disposition: A | Discharge status: Home / Self Care | Payer: Medicare HMO | Attending: Internal Medicine | Admitting: Internal Medicine

## 2019-06-08 DIAGNOSIS — Z992 Dependence on renal dialysis: Secondary | ICD-10-CM

## 2019-06-08 DIAGNOSIS — I10 Essential (primary) hypertension: Secondary | ICD-10-CM

## 2019-06-08 DIAGNOSIS — N19 Unspecified kidney failure: Secondary | ICD-10-CM

## 2019-06-08 DIAGNOSIS — E875 Hyperkalemia: Secondary | ICD-10-CM | POA: Diagnosis not present

## 2019-06-08 DIAGNOSIS — R4182 Altered mental status, unspecified: Secondary | ICD-10-CM | POA: Diagnosis not present

## 2019-06-08 DIAGNOSIS — N186 End stage renal disease: Secondary | ICD-10-CM

## 2019-06-08 DIAGNOSIS — G9341 Metabolic encephalopathy: Secondary | ICD-10-CM | POA: Diagnosis not present

## 2019-06-08 DIAGNOSIS — S72002A Fracture of unspecified part of neck of left femur, initial encounter for closed fracture: Secondary | ICD-10-CM | POA: Diagnosis not present

## 2019-06-08 DIAGNOSIS — T82898A Other specified complication of vascular prosthetic devices, implants and grafts, initial encounter: Secondary | ICD-10-CM

## 2019-06-08 HISTORY — PX: IR US GUIDE VASC ACCESS RIGHT: IMG2390

## 2019-06-08 HISTORY — PX: IR FLUORO GUIDE CV LINE RIGHT: IMG2283

## 2019-06-08 LAB — CBC WITH DIFFERENTIAL/PLATELET
Abs Immature Granulocytes: 0.04 10*3/uL (ref 0.00–0.07)
Basophils Absolute: 0 10*3/uL (ref 0.0–0.1)
Basophils Relative: 0 %
Eosinophils Absolute: 0 10*3/uL (ref 0.0–0.5)
Eosinophils Relative: 0 %
HCT: 41 % (ref 36.0–46.0)
Hemoglobin: 13.2 g/dL (ref 12.0–15.0)
Immature Granulocytes: 0 %
Lymphocytes Relative: 10 %
Lymphs Abs: 1.2 10*3/uL (ref 0.7–4.0)
MCH: 31.5 pg (ref 26.0–34.0)
MCHC: 32.2 g/dL (ref 30.0–36.0)
MCV: 97.9 fL (ref 80.0–100.0)
Monocytes Absolute: 0.8 10*3/uL (ref 0.1–1.0)
Monocytes Relative: 7 %
Neutro Abs: 9.9 10*3/uL — ABNORMAL HIGH (ref 1.7–7.7)
Neutrophils Relative %: 83 %
Platelets: 290 10*3/uL (ref 150–400)
RBC: 4.19 MIL/uL (ref 3.87–5.11)
RDW: 18.7 % — ABNORMAL HIGH (ref 11.5–15.5)
WBC: 12 10*3/uL — ABNORMAL HIGH (ref 4.0–10.5)
nRBC: 1.4 % — ABNORMAL HIGH (ref 0.0–0.2)

## 2019-06-08 LAB — BASIC METABOLIC PANEL
Anion gap: 26 — ABNORMAL HIGH (ref 5–15)
BUN: 120 mg/dL — ABNORMAL HIGH (ref 8–23)
CO2: 12 mmol/L — ABNORMAL LOW (ref 22–32)
Calcium: 9.2 mg/dL (ref 8.9–10.3)
Chloride: 93 mmol/L — ABNORMAL LOW (ref 98–111)
Creatinine, Ser: 14.25 mg/dL — ABNORMAL HIGH (ref 0.44–1.00)
GFR calc Af Amer: 3 mL/min — ABNORMAL LOW (ref >60)
GFR calc non Af Amer: 2 mL/min — ABNORMAL LOW (ref >60)
Glucose, Bld: 169 mg/dL — ABNORMAL HIGH (ref 70–99)
Potassium: 7 mmol/L (ref 3.5–5.1)
Sodium: 131 mmol/L — ABNORMAL LOW (ref 135–145)

## 2019-06-08 LAB — COMPREHENSIVE METABOLIC PANEL
ALT: <5 U/L (ref 0–44)
AST: 55 U/L — ABNORMAL HIGH (ref 15–41)
Albumin: 3.5 g/dL (ref 3.5–5.0)
Alkaline Phosphatase: 77 U/L (ref 38–126)
Anion gap: 27 — ABNORMAL HIGH (ref 5–15)
BUN: 44 mg/dL — ABNORMAL HIGH (ref 8–23)
CO2: 14 mmol/L — ABNORMAL LOW (ref 22–32)
Calcium: 8.8 mg/dL — ABNORMAL LOW (ref 8.9–10.3)
Chloride: 94 mmol/L — ABNORMAL LOW (ref 98–111)
Creatinine, Ser: 7.63 mg/dL — ABNORMAL HIGH (ref 0.44–1.00)
GFR calc Af Amer: 6 mL/min — ABNORMAL LOW (ref >60)
GFR calc non Af Amer: 5 mL/min — ABNORMAL LOW (ref >60)
Glucose, Bld: 144 mg/dL — ABNORMAL HIGH (ref 70–99)
Potassium: 5.4 mmol/L — ABNORMAL HIGH (ref 3.5–5.1)
Sodium: 135 mmol/L (ref 135–145)
Total Bilirubin: 1.1 mg/dL (ref 0.3–1.2)
Total Protein: 8 g/dL (ref 6.5–8.1)

## 2019-06-08 LAB — CBC
HCT: 38 % (ref 36.0–46.0)
Hemoglobin: 11.9 g/dL — ABNORMAL LOW (ref 12.0–15.0)
MCH: 31.6 pg (ref 26.0–34.0)
MCHC: 31.3 g/dL (ref 30.0–36.0)
MCV: 101.1 fL — ABNORMAL HIGH (ref 80.0–100.0)
Platelets: 309 10*3/uL (ref 150–400)
RBC: 3.76 MIL/uL — ABNORMAL LOW (ref 3.87–5.11)
RDW: 18.4 % — ABNORMAL HIGH (ref 11.5–15.5)
WBC: 11.2 10*3/uL — ABNORMAL HIGH (ref 4.0–10.5)
nRBC: 1.3 % — ABNORMAL HIGH (ref 0.0–0.2)

## 2019-06-08 LAB — HEPATITIS B SURFACE ANTIGEN: Hepatitis B Surface Ag: NONREACTIVE

## 2019-06-08 LAB — AMMONIA: Ammonia: 37 umol/L — ABNORMAL HIGH (ref 9–35)

## 2019-06-08 LAB — GLUCOSE, CAPILLARY: Glucose-Capillary: 168 mg/dL — ABNORMAL HIGH (ref 70–99)

## 2019-06-08 LAB — PROTIME-INR
INR: 1.3 — ABNORMAL HIGH (ref 0.8–1.2)
Prothrombin Time: 16.2 seconds — ABNORMAL HIGH (ref 11.4–15.2)

## 2019-06-08 IMAGING — XA IR FLUORO GUIDE CV LINE*R*
2 series · 2 of 2 positions shown · non-contrast
Comparison: none

INDICATION: 73-year-old female referred for hemodialysis catheter placement

[Series 1: ir fluoro guide cv line*right* · 1 of 1 slices shown]
[im 1/1]
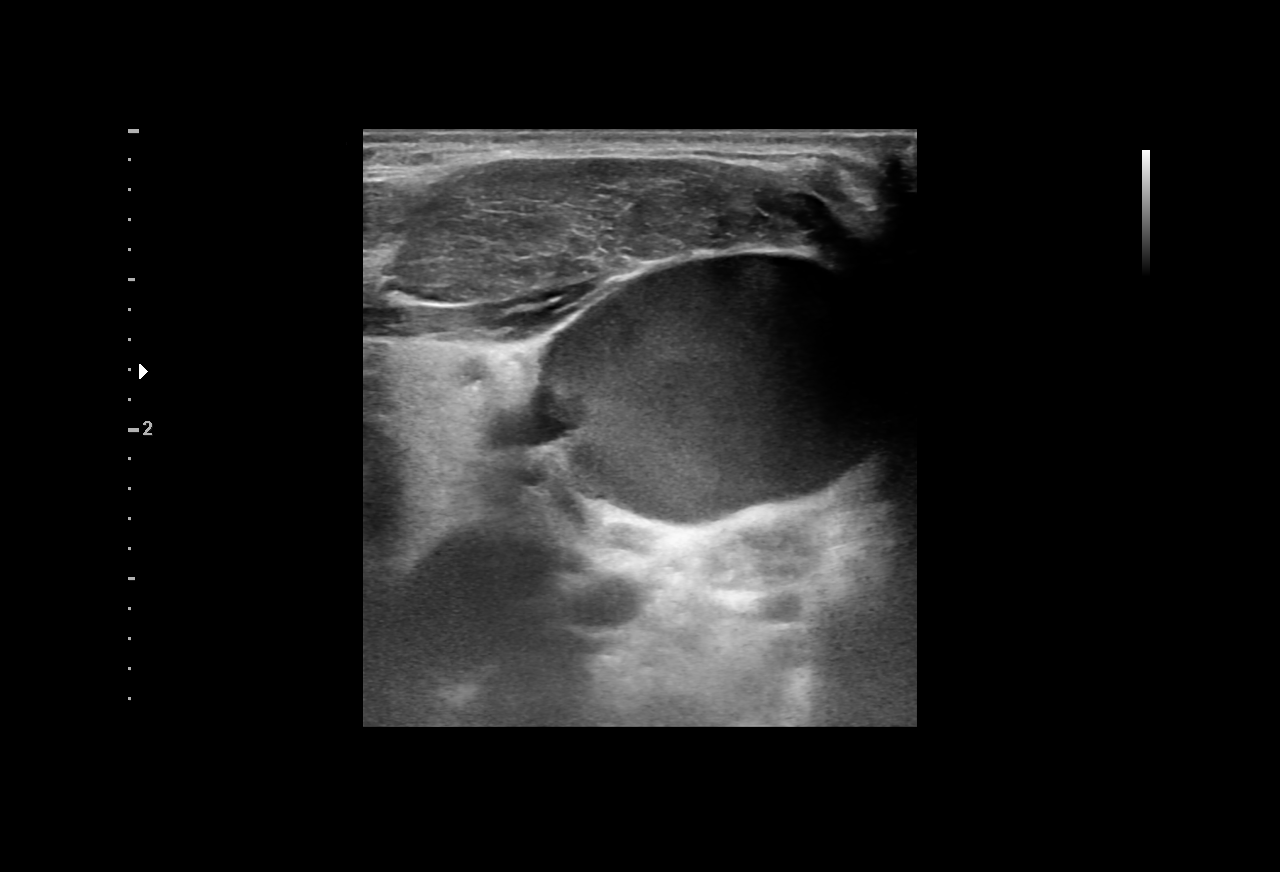

[Series 1: fl (-) angio · 1 of 1 slices shown]
[im 1/1]
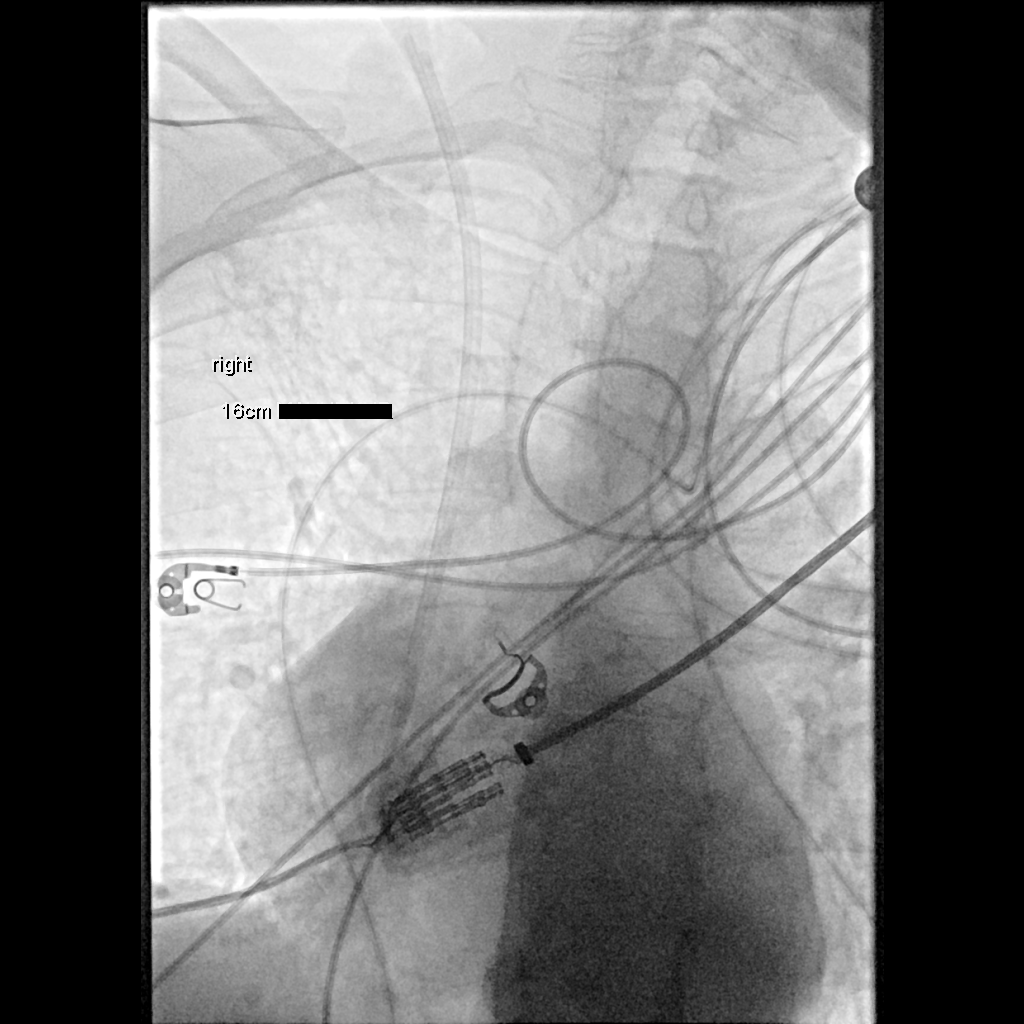

[2 of 2 positions shown; findings below may reference images not displayed]

EXAM:
IMAGE GUIDED PLACEMENT OF TEMPORARY HEMODIALYSIS CATHETER

MEDICATIONS:
NONE

ANESTHESIA/SEDATION:
NONE

FLUOROSCOPY TIME:  Fluoroscopy Time: 0 minutes 6 seconds ( mGy).

COMPLICATIONS:
None

PROCEDURE:
Informed written consent was obtained from the patient's family
after a discussion of the risks, benefits, and alternatives to
treatment. Questions regarding the procedure were encouraged and
answered. The right neck was prepped with chlorhexidine in a sterile
fashion, and a sterile drape was applied covering the operative
field. Maximum barrier sterile technique with sterile gowns and
gloves were used for the procedure. A timeout was performed prior to
the initiation of the procedure.

A micropuncture kit was utilized to access the right internal
jugular vein under direct, real-time ultrasound guidance after the
overlying soft tissues were anesthetized with 1% lidocaine with
epinephrine. Ultrasound image documentation was performed. The
microwire was kinked to measure appropriate catheter length. A stiff
glidewire was advanced to the level of the IVC. A 19 cm hemodialysis
catheter was then placed over the wire.

Final catheter positioning was confirmed and documented with a spot
radiographic image. The catheter aspirates and flushes normally. The
catheter was flushed with appropriate volume heparin dwells.

Dressings were applied. The patient tolerated the procedure well
without immediate post procedural complication.
IMPRESSION: Status post right IJ temporary hemodialysis catheter placement

## 2019-06-08 IMAGING — MR MR HEAD W/O CM
14 of 15 series · 44 of 48 positions shown · non-contrast
Comparison: Prior CT from [DATE] as well as recent brain MRI
from [DATE].

CLINICAL DATA: Follow-up examination for acute confusion, metabolic
encephalopathy, evaluate for possible stroke.

EXAM:
MRI HEAD WITHOUT CONTRAST
TECHNIQUE: Multiplanar, multiecho pulse sequences of the brain and surrounding
structures were obtained without intravenous contrast.

[Series 7: DWI · axial · 3.0mm · 0.88mm/px · z∈[-97,+49]mm · 6 of 100 slices shown (1 of 4)]
[im 1/100]
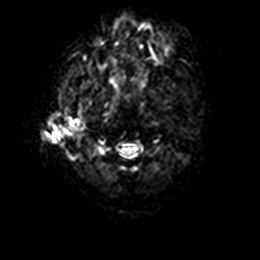
[im 20/100]
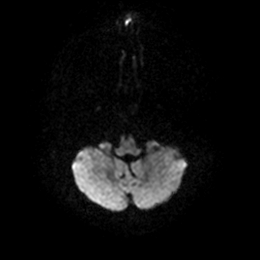
[im 40/100]
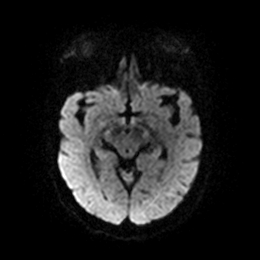
[im 60/100]
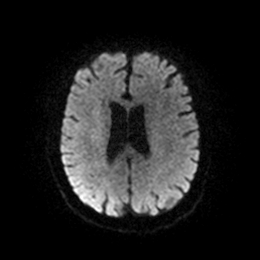
[im 80/100]
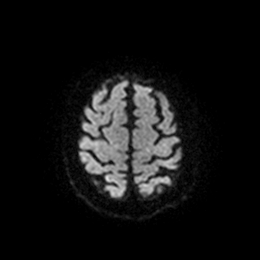
[im 100/100]
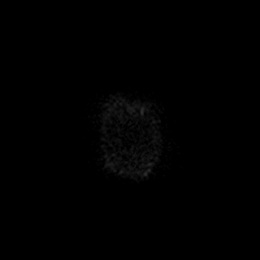

[Series 8: DWI · axial · 3.0mm · 0.88mm/px · z∈[-97,+49]mm · 3 of 50 slices shown (2 of 4)]
[im 1/50]
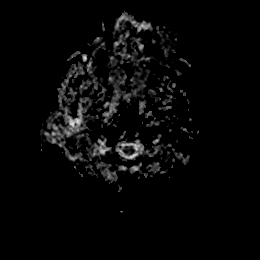
[im 25/50]
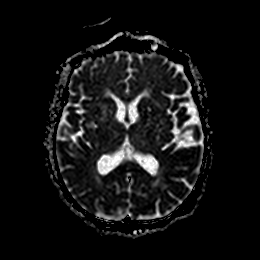
[im 50/50]
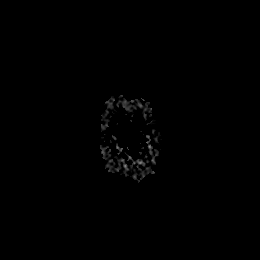

[Series 9: DWI · coronal · 4.0mm · 0.88mm/px · 5 of 68 slices shown (3 of 4)]
[im 1/68]
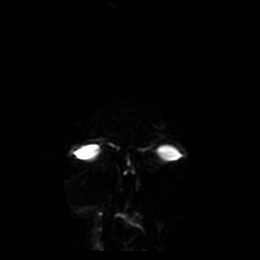
[im 17/68]
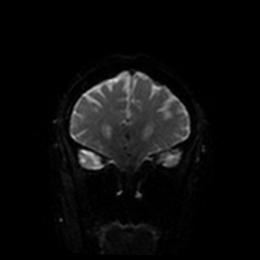
[im 34/68]
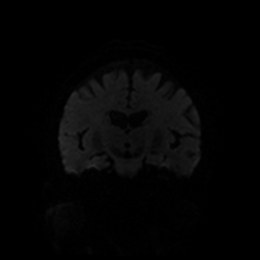
[im 51/68]
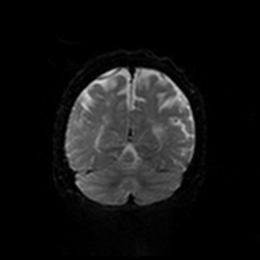
[im 68/68]
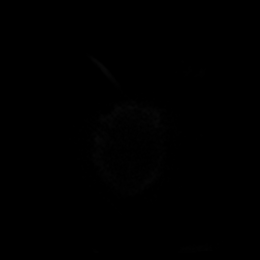

[Series 10: DWI · coronal · 4.0mm · 0.88mm/px · 2 of 34 slices shown (4 of 4)]
[im 1/34]
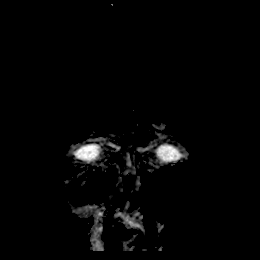
[im 34/34]
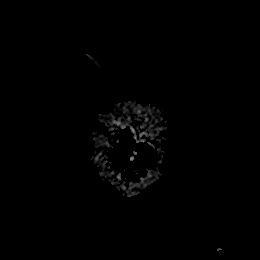

[Series 11: T2 · axial · 5.0mm · 0.72mm/px · z∈[-95,+48]mm · 2 of 25 slices shown (1 of 2)]
[im 1/25]
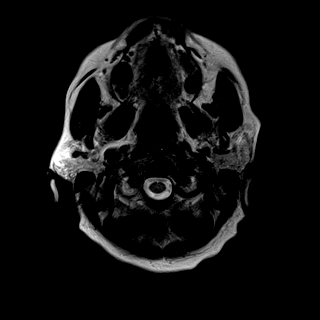
[im 25/25]
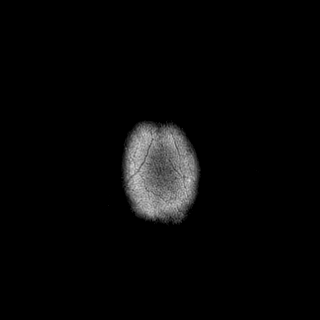

[Series 12: T1 · sagittal · 5.0mm · 0.75mm/px · 2 of 23 slices shown]
[im 1/23]
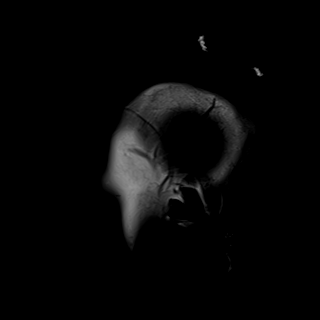
[im 23/23]
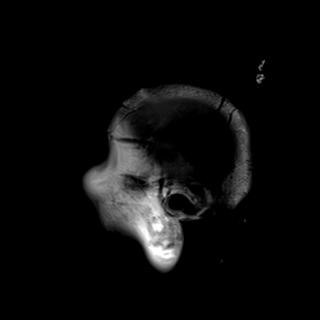

[Series 13: FLAIR · axial · 5.0mm · 0.45mm/px · z∈[-97,+52]mm · 2 of 26 slices shown]
[im 1/26]
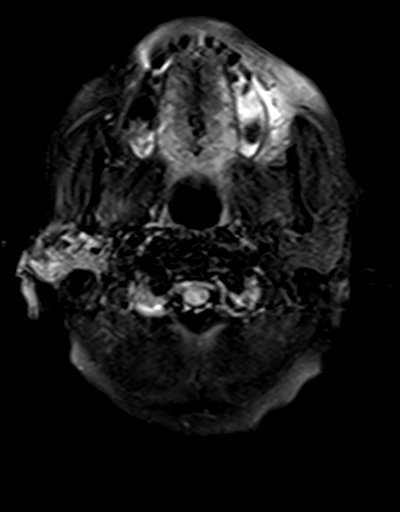
[im 26/26]
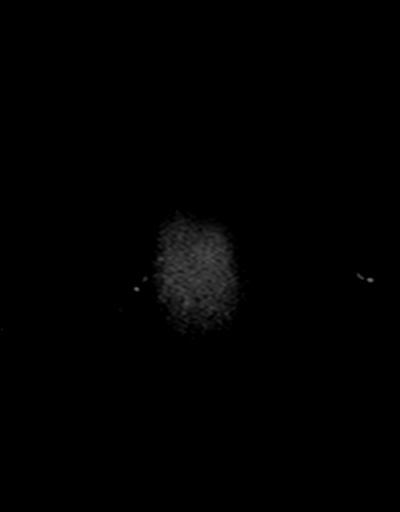

[Series 14: mag_images · axial · 3.0mm · 0.90mm/px · z∈[-99,+53]mm · 4 of 52 slices shown]
[im 1/52]
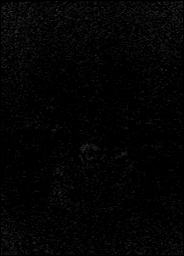
[im 18/52]
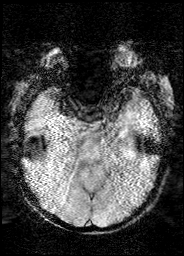
[im 35/52]
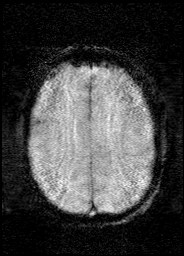
[im 52/52]
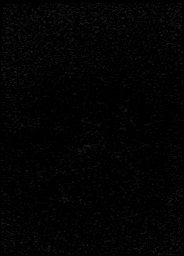

[Series 15: pha_images · axial · 3.0mm · 0.90mm/px · z∈[-99,+53]mm · 3 of 49 slices shown (1 of 2)]
[im 1/49]
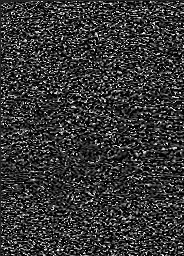
[im 25/49]
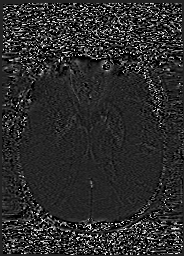
[im 49/49]
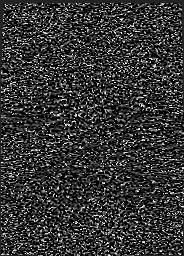

[Series 16: swi_images · axial · 3.0mm · 0.90mm/px · z∈[-99,+50]mm · 4 of 51 slices shown]
[im 1/51]
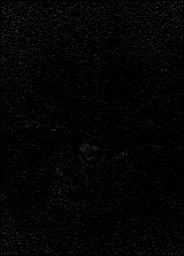
[im 17/51]
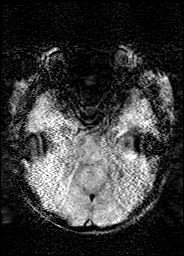
[im 34/51]
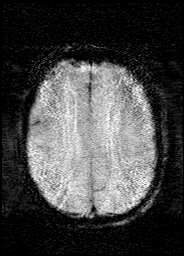
[im 51/51]
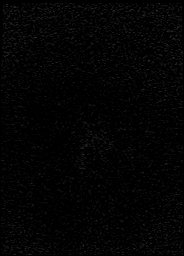

[Series 19: T2 · coronal · 5.0mm · 0.34mm/px · 2 of 28 slices shown (2 of 2)]
[im 1/28]
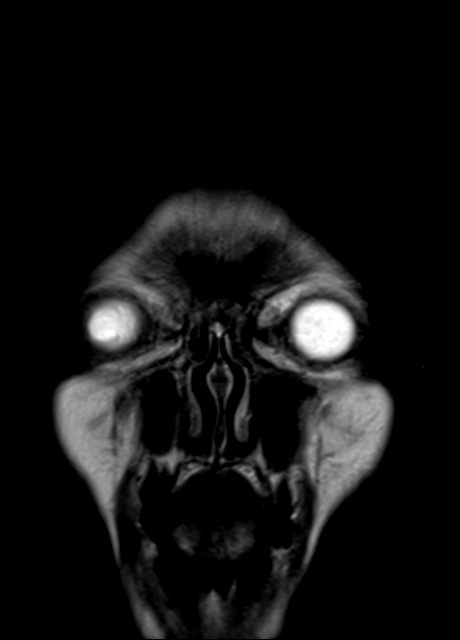
[im 28/28]
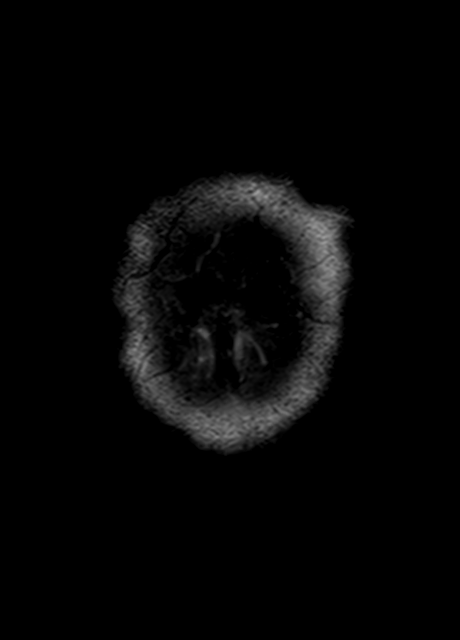

[Series 21: pha_images · axial · 3.0mm · 0.90mm/px · z∈[-99,+50]mm · 3 of 50 slices shown (2 of 2)]
[im 1/50]
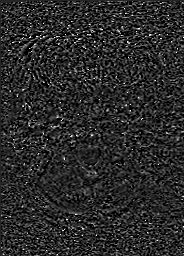
[im 25/50]
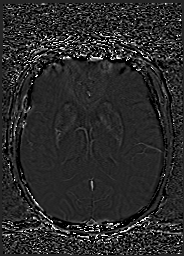
[im 50/50]
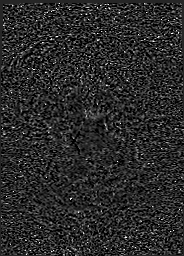

[Series 22: swi_images_nonorm · axial · 3.0mm · 0.90mm/px · z∈[-99,+53]mm · 4 of 52 slices shown]
[im 1/52]
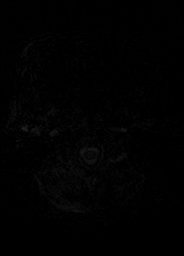
[im 18/52]
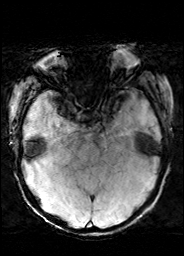
[im 35/52]
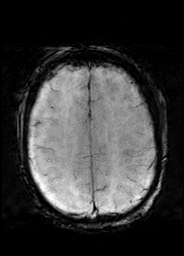
[im 52/52]
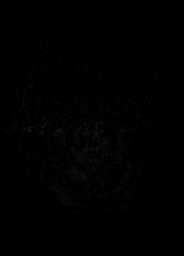

[Series 23: mag_images_nonorm · axial · 3.0mm · 0.90mm/px · z∈[-52,+53]mm · 2 of 36 slices shown]
[im 1/36]
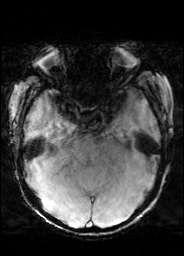
[im 36/36]
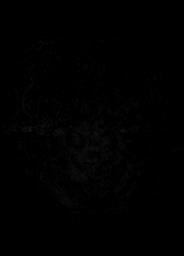

[44 of 48 positions shown; findings below may reference images not displayed]

FINDINGS: Brain: Examination moderately degraded by motion artifact.

Generalized age-related cerebral atrophy. Patchy T2/FLAIR
hyperintensity within the periventricular and deep white matter both
cerebral hemispheres most consistent with chronic small vessel
ischemic disease, moderate in nature. Patchy involvement of the pons
present. Superimposed remote lacunar infarcts present at the
bilateral basal ganglia and thalami.

No abnormal foci of restricted diffusion to suggest acute or
subacute ischemia. Gray-white matter differentiation maintained. No
encephalomalacia to suggest chronic cortical infarction. No foci of
susceptibility artifact to suggest acute or chronic intracranial
hemorrhage.

No mass lesion, midline shift or mass effect. Ventricles normal size
without hydrocephalus. No extra-axial fluid collection. No made of
an empty sella. Midline structures intact.

Vascular: Major intracranial vascular flow voids are maintained.

Skull and upper cervical spine: Craniocervical junction within
normal limits. Bone marrow signal intensity normal. No scalp soft
tissue abnormality.

Sinuses/Orbits: Patient status post bilateral ocular lens
replacement. Globes and orbital soft tissues demonstrate no acute
finding. Paranasal sinuses are clear. Trace left mastoid effusion
noted, of doubtful significance. Inner ear structures grossly
normal.

Other: None.
IMPRESSION: 1. No acute intracranial infarct or other abnormality.
2. Age-related cerebral atrophy with moderate chronic small vessel
ischemic disease, stable from previous.

## 2019-06-08 MED ORDER — SODIUM BICARBONATE 8.4 % IV SOLN
50.0000 meq | Freq: Once | INTRAVENOUS | Status: AC
  Administered 2019-06-08: 09:00:00 50 meq via INTRAVENOUS
  Filled 2019-06-08: qty 50

## 2019-06-08 MED ORDER — SODIUM CHLORIDE 0.9% FLUSH: 10.0000 mL | INTRAVENOUS | Status: DC | PRN

## 2019-06-08 MED ORDER — HEPARIN SODIUM (PORCINE) 1000 UNIT/ML IJ SOLN
INTRAMUSCULAR | Status: AC
  Administered 2019-06-08: 13:00:00 2600 [IU]
  Filled 2019-06-08: qty 3

## 2019-06-08 MED ORDER — CALCIUM GLUCONATE-NACL 1-0.675 GM/50ML-% IV SOLN
1.0000 g | Freq: Once | INTRAVENOUS | Status: AC
  Administered 2019-06-08: 1000 mg via INTRAVENOUS
  Filled 2019-06-08: qty 50

## 2019-06-08 MED ORDER — INSULIN ASPART 100 UNIT/ML IV SOLN
5.0000 [IU] | Freq: Once | INTRAVENOUS | Status: AC
  Administered 2019-06-08: 5 [IU] via INTRAVENOUS

## 2019-06-08 MED ORDER — LIDOCAINE HCL 1 % IJ SOLN
INTRAMUSCULAR | Status: AC
  Filled 2019-06-08: qty 20

## 2019-06-08 MED ORDER — HEPARIN SODIUM (PORCINE) 1000 UNIT/ML DIALYSIS
2000.0000 [IU] | Freq: Once | INTRAMUSCULAR | Status: DC
  Filled 2019-06-08: qty 2

## 2019-06-08 MED ORDER — LIDOCAINE HCL (PF) 1 % IJ SOLN
INTRAMUSCULAR | Status: AC | PRN
  Administered 2019-06-08: 5 mL

## 2019-06-08 MED ORDER — SODIUM ZIRCONIUM CYCLOSILICATE 10 G PO PACK
10.0000 g | PACK | Freq: Once | ORAL | Status: DC
  Filled 2019-06-08: qty 1

## 2019-06-08 MED ORDER — CHLORHEXIDINE GLUCONATE CLOTH 2 % EX PADS
6.0000 | MEDICATED_PAD | Freq: Every day | CUTANEOUS | Status: DC
  Administered 2019-06-09 – 2019-06-15 (×5): 6 via TOPICAL

## 2019-06-08 MED ORDER — ALBUTEROL SULFATE (2.5 MG/3ML) 0.083% IN NEBU
2.5000 mg | INHALATION_SOLUTION | Freq: Once | RESPIRATORY_TRACT | Status: DC
  Filled 2019-06-08: qty 3

## 2019-06-08 MED ORDER — HEPARIN SODIUM (PORCINE) 5000 UNIT/ML IJ SOLN
5000.0000 [IU] | Freq: Three times a day (TID) | INTRAMUSCULAR | Status: DC
  Administered 2019-06-08 – 2019-06-09 (×5): 5000 [IU] via SUBCUTANEOUS
  Filled 2019-06-08 (×5): qty 1

## 2019-06-08 MED ORDER — HEPARIN SODIUM (PORCINE) 1000 UNIT/ML IJ SOLN
INTRAMUSCULAR | Status: AC
  Filled 2019-06-08: qty 1

## 2019-06-08 MED ORDER — DEXTROSE 50 % IV SOLN
1.0000 | Freq: Once | INTRAVENOUS | Status: AC
  Administered 2019-06-08: 09:00:00 50 mL via INTRAVENOUS
  Filled 2019-06-08: qty 50

## 2019-06-08 MED ORDER — FENTANYL CITRATE (PF) 100 MCG/2ML IJ SOLN
25.0000 ug | INTRAMUSCULAR | Status: DC | PRN
  Administered 2019-06-09: 0.5 ug via INTRAVENOUS
  Filled 2019-06-08 (×2): qty 2

## 2019-06-08 NOTE — Procedures (Signed)
Interventional Radiology Procedure Note  Procedure: Placement of a right IJ approach temp trialysis HD catheter. 19cm length.  Tip is positioned at the superior cavoatrial junction and catheter is ready for immediate use.  Complications: None Recommendations:  - Ok to use.  May convert if needed.  - Do not submerge - Routine care   Signed,  Dulcy Fanny. Earleen Newport, DO

## 2019-06-08 NOTE — ED Notes (Signed)
PLEASE CALL SON JERMAINE ANDERSON AT [phone] [place]  [place] HE HAS NOT HAD AN UPDATE AT ALL

## 2019-06-08 NOTE — Progress Notes (Signed)
Patient ID: Rande Dario, female   DOB: December 19, 1945, 74 y.o.   MRN: [id number]  PROGRESS NOTE    Olivia Pavelko  WNU:[id number] DOB: [birth year] DOA: 06/07/2019 PCP: [name], MD   Brief Narrative:  74 year old female with history of end-stage renal disease on hemodialysis, diabetes mellitus type 2, hypertension, DJD, MSSA bacteremia in January 2021 presented on 06/07/2019 after a fall.  Apparently, she went to her dialysis center and as per on 06/06/2019 but could not get her dialysis done due to issues with her AV fistula, subsequently went back home and then subsequently fell down, unclear if she had a syncopal event.  In the ED, patient was confused with creatinine of 13, potassium of 6.9 and bicarb of 14.  Nephrology was consulted.  She was also found to have left hip fracture for which orthopedics was consulted  Assessment & Plan:  Left hip fracture status post fall -Orthopedics following and planning for surgical intervention once medically stable -Fall precautions.  Pain management -Hopefully, her electrolyte abnormalities will improve by tomorrow morning and patient should be okay for surgical intervention in a.m.  Keep n.p.o. past midnight.  Hyperkalemia End-stage renal disease on hemodialysis Uremia  acute metabolic encephalopathy -Presented with hyperkalemia and uremia with confusion most likely from uremia.  Patient had difficulty cannulation of fistula recently and missed her dialysis as an outpatient recently.  Nephrology following. -Status post IR guided [facility] HD catheter placement today.  Dialysis as per nephrology schedule. -Potassium was 7 this morning.  Ordered calcium gluconate, Lokelma, insulin/dextrose, sodium bicarbonate and albuterol nebulization.  Monitor potassium.  Hopefully, potassium will improve after hemodialysis today. -Monitor mental status.  Fall precautions.  Leukocytosis -Probably reactive.  Improving  Mildly elevated high-sensitivity  troponin -No chest pain.  Likely secondary to ESRD.  No further work-up needed  Hypertension -Monitor blood pressure.  Anemia of chronic disease -From renal failure.  Monitor  Generalized deconditioning -We will need PT evaluation  DVT prophylaxis: Heparin Code Status: Full Family Communication: Spoke to patient at bedside.  Called son but he did not pick up. Disposition Plan: Will need to remain inpatient for management of hyperkalemia or hip fracture.  Probable hip surgery in the next 1 to 2 days.  Might need SNF placement afterwards.  Consultants: Orthopedic/nephrology  Procedures:  IR guided [facility] HD catheter placement on 06/08/2019  Antimicrobials:  None  Subjective: Patient seen and examined undergoing hemodialysis.  She is awake, very slow to respond to questions and looks slightly confused.  No overnight fever, vomiting reported.  Complains of hip pain.  Objective: Vitals:   06/08/19 1200 06/08/19 1230 06/08/19 1300 06/08/19 1309  BP: (!) 163/102 139/72 128/88 98/73  Pulse: (!) 105 95 89 89  Resp:      Temp:    98.3 F (36.8 C)  TempSrc:    Oral  SpO2:    97%  Weight:      Height:        Intake/Output Summary (Last 24 hours) at 06/08/2019 1419 Last data filed at 06/08/2019 1309 Gross per 24 hour  Intake 50 ml  Output 2500 ml  Net -2450 ml   Filed Weights   06/07/19 1037  Weight: 75.8 kg    Examination:  General exam: Appears calm and comfortable.  Looks older than stated age. Respiratory system: Bilateral decreased breath sounds at bases with some scattered crackles Cardiovascular system: S1 & S2 heard, Rate controlled Gastrointestinal system: Abdomen is nondistended, soft and nontender. Normal bowel sounds heard. Extremities:  No cyanosis, clubbing; trace lower extremity edema Central nervous system: Awake but very poor historian and slow to respond.  Slightly confused.  No focal neurological deficits. Moving extremities Skin: No rashes, lesions or  ulcers Psychiatry: Could not be assessed because of mental status    Data Reviewed: I have personally reviewed following labs and imaging studies  CBC: Recent Labs  Lab 06/07/19 1116 06/08/19 0520  WBC 16.1* 11.2*  NEUTROABS 14.3*  --   HGB 12.5 11.9*  HCT 40.8 38.0  MCV 103.6* 101.1*  PLT 286 233   Basic Metabolic Panel: Recent Labs  Lab 06/07/19 1116 06/07/19 1725 06/08/19 0520  NA 134*  --  131*  K 6.9*  --  7.0*  CL 96*  --  93*  CO2 14*  --  12*  GLUCOSE 129*  --  169*  BUN 100*  --  120*  CREATININE 13.10*  --  14.25*  CALCIUM 9.2  --  9.2  MG  --  2.2  --    GFR: Estimated Creatinine Clearance: 3.7 mL/min (A) (by C-G formula based on SCr of 14.25 mg/dL (H)). Liver Function Tests: Recent Labs  Lab 06/07/19 1116  AST 29  ALT <5  ALKPHOS 84  BILITOT 0.9  PROT 7.9  ALBUMIN 3.4*   Recent Labs  Lab 06/07/19 1116  LIPASE 29   No results for input(s): AMMONIA in the last 168 hours. Coagulation Profile: Recent Labs  Lab 06/08/19 0603  INR 1.3*   Cardiac Enzymes: No results for input(s): CKTOTAL, CKMB, CKMBINDEX, TROPONINI in the last 168 hours. BNP (last 3 results) No results for input(s): PROBNP in the last 8760 hours. HbA1C: No results for input(s): HGBA1C in the last 72 hours. CBG: No results for input(s): GLUCAP in the last 168 hours. Lipid Profile: No results for input(s): CHOL, HDL, LDLCALC, TRIG, CHOLHDL, LDLDIRECT in the last 72 hours. Thyroid Function Tests: No results for input(s): TSH, T4TOTAL, FREET4, T3FREE, THYROIDAB in the last 72 hours. Anemia Panel: No results for input(s): VITAMINB12, FOLATE, FERRITIN, TIBC, IRON, RETICCTPCT in the last 72 hours. Sepsis Labs: Recent Labs  Lab 06/07/19 1149 06/07/19 1309  LATICACIDVEN 1.2 1.2    Recent Results (from the past 240 hour(s))  Respiratory Panel by RT PCR (Flu A&B, Covid) - Nasopharyngeal Swab     Status: None   Collection Time: 06/07/19  1:03 PM   Specimen: Nasopharyngeal  Swab  Result Value Ref Range Status   SARS Coronavirus 2 by RT PCR NEGATIVE NEGATIVE Final    Comment: (NOTE) SARS-CoV-2 target nucleic acids are NOT DETECTED. The SARS-CoV-2 RNA is generally detectable in upper respiratoy specimens during the acute phase of infection. The lowest concentration of SARS-CoV-2 viral copies this assay can detect is 131 copies/mL. A negative result does not preclude SARS-Cov-2 infection and should not be used as the sole basis for treatment or other patient management decisions. A negative result may occur with  improper specimen collection/handling, submission of specimen other than nasopharyngeal swab, presence of viral mutation(s) within the areas targeted by this assay, and inadequate number of viral copies (<131 copies/mL). A negative result must be combined with clinical observations, patient history, and epidemiological information. The expected result is Negative. Fact Sheet for Patients:  PinkCheek.be Fact Sheet for Healthcare Providers:  GravelBags.it This test is not yet ap proved or cleared by the [place] FDA and  has been authorized for detection and/or diagnosis of SARS-CoV-2 by FDA under an Emergency Use Authorization (EUA). This  EUA will remain  in effect (meaning this test can be used) for the duration of the COVID-19 declaration under Section 564(b)(1) of the Act, 21 U.S.C. section 360bbb-3(b)(1), unless the authorization is terminated or revoked sooner.    Influenza A by PCR NEGATIVE NEGATIVE Final   Influenza B by PCR NEGATIVE NEGATIVE Final    Comment: (NOTE) The Xpert Xpress SARS-CoV-2/FLU/RSV assay is intended as an aid in  the diagnosis of influenza from Nasopharyngeal swab specimens and  should not be used as a sole basis for treatment. Nasal washings and  aspirates are unacceptable for Xpert Xpress SARS-CoV-2/FLU/RSV  testing. Fact Sheet for  Patients: PinkCheek.be Fact Sheet for Healthcare Providers: GravelBags.it This test is not yet approved or cleared by the [place] FDA and  has been authorized for detection and/or diagnosis of SARS-CoV-2 by  FDA under an Emergency Use Authorization (EUA). This EUA will remain  in effect (meaning this test can be used) for the duration of the  Covid-19 declaration under Section 564(b)(1) of the Act, 21  U.S.C. section 360bbb-3(b)(1), unless the authorization is  terminated or revoked. Performed at [facility], [place] [address], [place], [place] [address]   Culture, blood (routine x 2)     Status: None (Preliminary result)   Collection Time: 06/07/19  6:48 PM   Specimen: BLOOD  Result Value Ref Range Status   Specimen Description BLOOD RIGHT ANTECUBITAL  Final   Special Requests   Final    BOTTLES DRAWN AEROBIC AND ANAEROBIC Blood Culture adequate volume   Culture   Final    NO GROWTH < 12 HOURS Performed at [facility], [place] [address] [place],  [address]    Report Status PENDING  Incomplete         Radiology Studies: CT ABDOMEN PELVIS WO CONTRAST  Result Date: 06/07/2019 CLINICAL DATA:  Abdominal trauma syncope, fall EXAM: CT CHEST WITHOUT CONTRAST TECHNIQUE: Multidetector CT imaging of the chest was performed following the standard protocol without IV contrast. COMPARISON:  October 28, 2013 FINDINGS: Cardiovascular: There is mild cardiomegaly. No significant pericardial fluid/thickening. Great vessels are normal in course and caliber. Limited evaluation of the vascular structures due to the lack of intravenous contrast. Scattered aortic atherosclerosis. Coronary artery calcifications are present. Mediastinum/Nodes: No pneumomediastinum. No large mediastinal hematoma. The trachea and esophagus are grossly unremarkable. Lungs/Pleura:Scattered areas of bronchiectasis with architectural distortion are seen  predominantly within both lung apices and at the posterior lower lungs. Centrilobular emphysematous changes seen at both lung apices. No pneumothorax. No pleural effusion. Musculoskeletal: Nondisplaced fractures are seen through the anterior right fourth and fifth ribs. Abdomen/pelvis: Hepatobiliary: Homogeneous hepatic attenuation without traumatic injury. No focal lesion. Gallbladder physiologically distended, no calcified stone. No biliary dilatation. Pancreas: No evidence for traumatic injury. Portions are partially obscured by adjacent bowel loops and paucity of intra-abdominal fat. No ductal dilatation or inflammation. Spleen: Again noted is a 5 cm low-density lesion in the posterior spleen. Normal in size. Adrenals/Urinary Tract: No adrenal hemorrhage. Bilateral renal atrophy is noted. There is a partially visualized low-density lesions seen within the lower pole the left kidney measuring 1.2 cm. Bladder is physiologically distended without wall thickening. Stomach/Bowel: Suboptimally assessed without enteric contrast, allowing for this, no evidence of bowel injury. Stomach physiologically distended. There are no dilated or thickened small or large bowel loops. Moderate stool burden. Scattered colonic diverticula are noted. No evidence of mesenteric hematoma. No free air free fluid. Vascular/Lymphatic: No acute vascular injury. The abdominal  aorta and IVC are intact. No evidence of retroperitoneal, abdominal, or pelvic adenopathy. Scattered aortic atherosclerosis is noted. Reproductive: No acute abnormality. Partially calcified posterior uterine fibroid is seen. Other: No focal contusion or abnormality of the abdominal wall. A small fat containing anterior umbilical hernia is noted. Musculoskeletal: There is a comminuted mildly displaced left transoral femoral neck fracture. The femoral head is still well seated within the acetabulum. The patient is status post ORIF with plate and screw fixation of the  posterior right acetabulum. There is a grade 1 anterolisthesis of L5 on S1. There is progression of endplate sclerosis and reactive changes with erosions at L5-S1. IMPRESSION: 1. No acute intrathoracic, abdominal, or pelvic injury, h wall a owever somewhat limited due to lack of intravenous contrast. 2. Nondisplaced fractures through the right anterior fourth and fifth ribs. 3. Areas of chronic interstitial lung changes and scarring seen throughout both lungs with centrilobular emphysematous changes at the lung apices. 4. Diverticulosis without diverticulitis. 5.  Aortic Atherosclerosis (ICD10-I70.0). 6. Comminuted mildly displaced left transcervical femoral neck fracture. 7. Grade 1 anterolisthesis of L5 on S1 with advanced degenerative changes as on prior radiograph dating back to 2019. Electronically Signed   By: Prudencio Pair M.D.   On: 06/07/2019 13:16   DG Chest 1 View  Result Date: 06/07/2019 CLINICAL DATA:  Syncopal episode. EXAM: CHEST  1 VIEW COMPARISON:  04/08/2019 chest radiograph and prior. FINDINGS: Overlying telemetry wires obscure visualization of the chest. Bilateral upper lung scarring and right predominant volume loss, unchanged. Patchy and streaky bibasilar opacities likely reflect atelectasis. No pneumothorax or pleural effusion. Cardiomediastinal silhouette is grossly unchanged. Partially imaged left upper extremity vascular stent. Osteopenia and multilevel spondylosis. Aortic atherosclerotic calcifications. IMPRESSION: No active airspace disease. Biapical scarring with right upper lung volume loss, grossly unchanged. Electronically Signed   By: Primitivo Gauze M.D.   On: 06/07/2019 12:44   DG Pelvis 1-2 Views  Result Date: 06/07/2019 CLINICAL DATA:  Syncope, fall, left hip pain EXAM: PELVIS - 1-2 VIEW; LEFT FEMUR 2 VIEWS COMPARISON:  None. FINDINGS: Osteopenia. There is a displaced transcervical fracture of the left femoral neck. No fracture or dislocation of the distal left femur.  Plate and screw fixation of the right acetabulum. No acute displaced fracture or dislocation of the pelvis or proximal right femur in frontal view only. IMPRESSION: 1. There is a displaced transcervical fracture of the left femoral neck. 2.  No fracture or dislocation of the distal left femur. 3. Osteopenia, which limits sensitivity for additional fractures of the pelvis. Electronically Signed   By: Eddie Candle M.D.   On: 06/07/2019 12:32   CT Head Wo Contrast  Result Date: 06/07/2019 CLINICAL DATA:  Fall at home.  Head injury EXAM: CT HEAD WITHOUT CONTRAST CT CERVICAL SPINE WITHOUT CONTRAST TECHNIQUE: Multidetector CT imaging of the head and cervical spine was performed following the standard protocol without intravenous contrast. Multiplanar CT image reconstructions of the cervical spine were also generated. COMPARISON:  None. FINDINGS: CT HEAD FINDINGS Brain: Mild atrophy without hydrocephalus. Patchy white matter hypodensity diffusely compatible with chronic microvascular ischemia. No acute hemorrhage, mass, or infarction. Small interhemispheric lipomas are present along the falx. This is felt to be incidental. Corpus callosum normally formed. Vascular: Negative for hyperdense vessel Skull: Negative Sinuses/Orbits: Paranasal sinuses clear. Bilateral cataract extraction. Other: None CT CERVICAL SPINE FINDINGS Alignment: Normal alignment with straightening of the cervical lordosis Skull base and vertebrae: Negative for fracture Soft tissues and spinal canal: No soft tissue mass or  edema. Disc levels: Disc degeneration and spurring C4-5 and C5-6. Moderate right foraminal stenosis at C5-6 due to spurring. Mild disc degeneration and spurring on the left at C6-7. Upper chest: CT chest reported separately. Other: None IMPRESSION: 1. No acute intracranial abnormality 2. Negative for cervical spine fracture. Mild to moderate cervical spondylosis. Electronically Signed   By: Franchot Gallo M.D.   On: 06/07/2019 12:57     CT Chest Wo Contrast  Result Date: 06/07/2019 CLINICAL DATA:  Abdominal trauma syncope, fall EXAM: CT CHEST WITHOUT CONTRAST TECHNIQUE: Multidetector CT imaging of the chest was performed following the standard protocol without IV contrast. COMPARISON:  October 28, 2013 FINDINGS: Cardiovascular: There is mild cardiomegaly. No significant pericardial fluid/thickening. Great vessels are normal in course and caliber. Limited evaluation of the vascular structures due to the lack of intravenous contrast. Scattered aortic atherosclerosis. Coronary artery calcifications are present. Mediastinum/Nodes: No pneumomediastinum. No large mediastinal hematoma. The trachea and esophagus are grossly unremarkable. Lungs/Pleura:Scattered areas of bronchiectasis with architectural distortion are seen predominantly within both lung apices and at the posterior lower lungs. Centrilobular emphysematous changes seen at both lung apices. No pneumothorax. No pleural effusion. Musculoskeletal: Nondisplaced fractures are seen through the anterior right fourth and fifth ribs. Abdomen/pelvis: Hepatobiliary: Homogeneous hepatic attenuation without traumatic injury. No focal lesion. Gallbladder physiologically distended, no calcified stone. No biliary dilatation. Pancreas: No evidence for traumatic injury. Portions are partially obscured by adjacent bowel loops and paucity of intra-abdominal fat. No ductal dilatation or inflammation. Spleen: Again noted is a 5 cm low-density lesion in the posterior spleen. Normal in size. Adrenals/Urinary Tract: No adrenal hemorrhage. Bilateral renal atrophy is noted. There is a partially visualized low-density lesions seen within the lower pole the left kidney measuring 1.2 cm. Bladder is physiologically distended without wall thickening. Stomach/Bowel: Suboptimally assessed without enteric contrast, allowing for this, no evidence of bowel injury. Stomach physiologically distended. There are no dilated or  thickened small or large bowel loops. Moderate stool burden. Scattered colonic diverticula are noted. No evidence of mesenteric hematoma. No free air free fluid. Vascular/Lymphatic: No acute vascular injury. The abdominal aorta and IVC are intact. No evidence of retroperitoneal, abdominal, or pelvic adenopathy. Scattered aortic atherosclerosis is noted. Reproductive: No acute abnormality. Partially calcified posterior uterine fibroid is seen. Other: No focal contusion or abnormality of the abdominal wall. A small fat containing anterior umbilical hernia is noted. Musculoskeletal: There is a comminuted mildly displaced left transoral femoral neck fracture. The femoral head is still well seated within the acetabulum. The patient is status post ORIF with plate and screw fixation of the posterior right acetabulum. There is a grade 1 anterolisthesis of L5 on S1. There is progression of endplate sclerosis and reactive changes with erosions at L5-S1. IMPRESSION: 1. No acute intrathoracic, abdominal, or pelvic injury, h wall a owever somewhat limited due to lack of intravenous contrast. 2. Nondisplaced fractures through the right anterior fourth and fifth ribs. 3. Areas of chronic interstitial lung changes and scarring seen throughout both lungs with centrilobular emphysematous changes at the lung apices. 4. Diverticulosis without diverticulitis. 5.  Aortic Atherosclerosis (ICD10-I70.0). 6. Comminuted mildly displaced left transcervical femoral neck fracture. 7. Grade 1 anterolisthesis of L5 on S1 with advanced degenerative changes as on prior radiograph dating back to 2019. Electronically Signed   By: Prudencio Pair M.D.   On: 06/07/2019 13:16   CT Cervical Spine Wo Contrast  Result Date: 06/07/2019 CLINICAL DATA:  Fall at home.  Head injury EXAM: CT HEAD WITHOUT CONTRAST  CT CERVICAL SPINE WITHOUT CONTRAST TECHNIQUE: Multidetector CT imaging of the head and cervical spine was performed following the standard protocol  without intravenous contrast. Multiplanar CT image reconstructions of the cervical spine were also generated. COMPARISON:  None. FINDINGS: CT HEAD FINDINGS Brain: Mild atrophy without hydrocephalus. Patchy white matter hypodensity diffusely compatible with chronic microvascular ischemia. No acute hemorrhage, mass, or infarction. Small interhemispheric lipomas are present along the falx. This is felt to be incidental. Corpus callosum normally formed. Vascular: Negative for hyperdense vessel Skull: Negative Sinuses/Orbits: Paranasal sinuses clear. Bilateral cataract extraction. Other: None CT CERVICAL SPINE FINDINGS Alignment: Normal alignment with straightening of the cervical lordosis Skull base and vertebrae: Negative for fracture Soft tissues and spinal canal: No soft tissue mass or edema. Disc levels: Disc degeneration and spurring C4-5 and C5-6. Moderate right foraminal stenosis at C5-6 due to spurring. Mild disc degeneration and spurring on the left at C6-7. Upper chest: CT chest reported separately. Other: None IMPRESSION: 1. No acute intracranial abnormality 2. Negative for cervical spine fracture. Mild to moderate cervical spondylosis. Electronically Signed   By: Franchot Gallo M.D.   On: 06/07/2019 12:57   IR Fluoro Guide CV Line Right  Result Date: 06/08/2019 INDICATION: 74 year old female referred for hemodialysis catheter placement EXAM: IMAGE GUIDED PLACEMENT OF TEMPORARY HEMODIALYSIS CATHETER MEDICATIONS: NONE ANESTHESIA/SEDATION: NONE FLUOROSCOPY TIME:  Fluoroscopy Time: 0 minutes 6 seconds ( mGy). COMPLICATIONS: None PROCEDURE: Informed written consent was obtained from the patient's family after a discussion of the risks, benefits, and alternatives to treatment. Questions regarding the procedure were encouraged and answered. The right neck was prepped with chlorhexidine in a sterile fashion, and a sterile drape was applied covering the operative field. Maximum barrier sterile technique with  sterile gowns and gloves were used for the procedure. A timeout was performed prior to the initiation of the procedure. A micropuncture kit was utilized to access the right internal jugular vein under direct, real-time ultrasound guidance after the overlying soft tissues were anesthetized with 1% lidocaine with epinephrine. Ultrasound image documentation was performed. The microwire was kinked to measure appropriate catheter length. A stiff glidewire was advanced to the level of the IVC. A 19 cm hemodialysis catheter was then placed over the wire. Final catheter positioning was confirmed and documented with a spot radiographic image. The catheter aspirates and flushes normally. The catheter was flushed with appropriate volume heparin dwells. Dressings were applied. The patient tolerated the procedure well without immediate post procedural complication. IMPRESSION: Status post right IJ temporary hemodialysis catheter placement Signed, Dulcy Fanny. Dellia Nims, RPVI [facility] Electronically Signed   By: Corrie Mckusick D.O.   On: 06/08/2019 11:49   IR US Guide Vasc Access Right  Result Date: 06/08/2019 INDICATION: 74 year old female referred for hemodialysis catheter placement EXAM: IMAGE GUIDED PLACEMENT OF TEMPORARY HEMODIALYSIS CATHETER MEDICATIONS: NONE ANESTHESIA/SEDATION: NONE FLUOROSCOPY TIME:  Fluoroscopy Time: 0 minutes 6 seconds ( mGy). COMPLICATIONS: None PROCEDURE: Informed written consent was obtained from the patient's family after a discussion of the risks, benefits, and alternatives to treatment. Questions regarding the procedure were encouraged and answered. The right neck was prepped with chlorhexidine in a sterile fashion, and a sterile drape was applied covering the operative field. Maximum barrier sterile technique with sterile gowns and gloves were used for the procedure. A timeout was performed prior to the initiation of the procedure. A  micropuncture kit was utilized to access the right internal jugular vein under direct, real-time ultrasound guidance after the overlying  soft tissues were anesthetized with 1% lidocaine with epinephrine. Ultrasound image documentation was performed. The microwire was kinked to measure appropriate catheter length. A stiff glidewire was advanced to the level of the IVC. A 19 cm hemodialysis catheter was then placed over the wire. Final catheter positioning was confirmed and documented with a spot radiographic image. The catheter aspirates and flushes normally. The catheter was flushed with appropriate volume heparin dwells. Dressings were applied. The patient tolerated the procedure well without immediate post procedural complication. IMPRESSION: Status post right IJ temporary hemodialysis catheter placement Signed, Dulcy Fanny. Dellia Nims, RPVI [facility] and [facility] Radiology Electronically Signed   By: Corrie Mckusick D.O.   On: 06/08/2019 11:49   DG FEMUR MIN 2 VIEWS LEFT  Result Date: 06/07/2019 CLINICAL DATA:  Syncope, fall, left hip pain EXAM: PELVIS - 1-2 VIEW; LEFT FEMUR 2 VIEWS COMPARISON:  None. FINDINGS: Osteopenia. There is a displaced transcervical fracture of the left femoral neck. No fracture or dislocation of the distal left femur. Plate and screw fixation of the right acetabulum. No acute displaced fracture or dislocation of the pelvis or proximal right femur in frontal view only. IMPRESSION: 1. There is a displaced transcervical fracture of the left femoral neck. 2.  No fracture or dislocation of the distal left femur. 3. Osteopenia, which limits sensitivity for additional fractures of the pelvis. Electronically Signed   By: Eddie Candle M.D.   On: 06/07/2019 12:32        Scheduled Meds: . albuterol  2.5 mg Nebulization Once  . Chlorhexidine Gluconate Cloth  6 each Topical Q0600  . heparin      . lidocaine      . sodium zirconium cyclosilicate  10 g  Oral Once   Continuous Infusions:        Aline August, MD [facility] 06/08/2019, 2:19 PM

## 2019-06-08 NOTE — Consult Note (Addendum)
Hospital Consult    Reason for Consult:  Problems with L arm AVF  Requesting Physician:  Dr. Jonnie Finner  MRN #:  [id number]  History of Present Illness: This is a 74 y.o. female with end-stage renal disease on hemodialysis seen in consultation for evaluation of left arm AV fistula.  She has a transposed basilic vein fistula in her left arm created by Dr. Donzetta Matters in September 2020.  A month later she underwent balloon angioplasty and stent grafting of fistula near the axilla.  Patient is a poor historian however based on chart review there has been some trouble cannulating left arm AV fistula to complete a hemodialysis treatment.  She had a temporary dialysis catheter placed in her right IJ by interventional radiology today.  Past Medical History:  Diagnosis Date  . Anemia   . Arthritis   . Chronic kidney disease (CKD) stage G5/A1, glomerular filtration rate (GFR) less than or equal to 15 mL/min/1.73 square meter and albuminuria creatinine ratio less than 30 mg/g (HCC)   . Depression   . Diabetes mellitus ([place])   . Gout   . History of anemia due to chronic kidney disease   . History of degenerative disc disease   . Hypertension   . Pneumonia   . Secondary hyperparathyroidism ([place])   . Spondylisthesis     Past Surgical History:  Procedure Laterality Date  . A/V FISTULAGRAM Left 12/16/2018   Procedure: A/V FISTULAGRAM;  Surgeon: Marty Heck, MD;  Location: [facility];  Service: Cardiovascular;  Laterality: Left;  . AV FISTULA PLACEMENT Left 08/24/2018   Procedure: ARTERIOVENOUS (AV) FISTULA CREATION;  Surgeon: Waynetta Sandy, MD;  Location: [place];  Service: Vascular;  Laterality: Left;  . BASCILIC VEIN TRANSPOSITION Left 11/04/2018   Procedure: BASCILIC VEIN TRANSPOSITION SECOND STAGE;  Surgeon: Waynetta Sandy, MD;  Location: [place];  Service: Vascular;  Laterality: Left;  . COLONOSCOPY    . HIP SURGERY    . IR FLUORO GUIDE CV LINE RIGHT  08/21/2018  .  IR FLUORO GUIDE CV LINE RIGHT  06/08/2019  . IR REMOVAL TUN CV CATH W/O FL  03/11/2019  . IR US GUIDE VASC ACCESS RIGHT  08/21/2018  . IR US GUIDE VASC ACCESS RIGHT  06/08/2019  . PERIPHERAL VASCULAR INTERVENTION Left 12/16/2018   Procedure: PERIPHERAL VASCULAR INTERVENTION;  Surgeon: Marty Heck, MD;  Location: [facility];  Service: Cardiovascular;  Laterality: Left;  arm fistula    No Known Allergies  Prior to Admission medications   Medication Sig Start Date End Date Taking? Authorizing Provider  acetaminophen (TYLENOL) 650 MG CR tablet Take 1,300 mg by mouth every 8 (eight) hours as needed for pain.   Yes [provider]  Ascorbic Acid (VITAMIN C) 1000 MG tablet Take 1,000 mg by mouth 2 (two) times a day.    Yes [provider]  atorvastatin (LIPITOR) 10 MG tablet Take 10 mg by mouth at bedtime. 05/14/18  Yes [provider]  escitalopram (LEXAPRO) 10 MG tablet Take 10 mg by mouth daily. 05/09/19  Yes [provider]  ferric citrate (AURYXIA) 1 GM 210 MG(Fe) tablet Take 420 mg by mouth 3 (three) times daily with meals.   Yes [provider]  furosemide (LASIX) 40 MG tablet Take 1 tablet (40 mg total) by mouth every Tuesday, Thursday, Saturday, and Sunday. Patient taking differently: Take 80 mg by mouth every Tuesday, Thursday, Saturday, and Sunday.  09/04/18  Yes Nita Sells, MD  labetalol (NORMODYNE) 200 MG tablet Take 200 mg by mouth 2 (two) times daily.   Yes [provider]  lidocaine-prilocaine (EMLA) cream Apply 1 application topically as directed. Prior to Dialysis 02/11/19  Yes [provider]  LOPERAMIDE HCL PO Take 4 mg by mouth every 4 (four) hours as needed for diarrhea or loose stools. 03/07/19 03/05/20 Yes [provider]  multivitamin (RENA-VIT) TABS tablet Take 1 tablet by mouth at bedtime. 08/27/18  Yes Dhungel, Nishant, MD  Omega-3 Fatty Acids (FISH OIL) 1000 MG CAPS Take 1,000 mg by mouth 2  (two) times daily.    Yes [provider]  Oxycodone HCl 10 MG TABS Take 10 mg by mouth every 8 (eight) hours as needed for pain. 04/18/19  Yes [provider]  polyethylene glycol (MIRALAX) 17 g packet Take 17 g by mouth daily as needed for moderate constipation. 08/27/18  Yes Dhungel, Nishant, MD  Turmeric 500 MG CAPS Take 500 mg by mouth daily.    Yes [provider]    Social History   Socioeconomic History  . Marital status: Widowed    Spouse name: Not on file  . Number of children: Not on file  . Years of education: Not on file  . Highest education level: Not on file  Occupational History  . Not on file  Tobacco Use  . Smoking status: Former Smoker    Quit date: 1973    Years since quitting: 48.2  . Smokeless tobacco: Never Used  Substance and Sexual Activity  . Alcohol use: Not Currently  . Drug use: Never  . Sexual activity: Not on file  Other Topics Concern  . Not on file  Social History Narrative  . Not on file   Social Determinants of Health   Financial Resource Strain:   . Difficulty of Paying Living Expenses:   Food Insecurity:   . Worried About Charity fundraiser in the Last Year:   . Arboriculturist in the Last Year:   Transportation Needs:   . Film/video editor (Medical):   Marland Kitchen Lack of Transportation (Non-Medical):   Physical Activity:   . Days of Exercise per Week:   . Minutes of Exercise per Session:   Stress:   . Feeling of Stress :   Social Connections:   . Frequency of Communication with Friends and Family:   . Frequency of Social Gatherings with Friends and Family:   . Attends Religious Services:   . Active Member of Clubs or Organizations:   . Attends Archivist Meetings:   Marland Kitchen Marital Status:   Intimate Partner Violence:   . Fear of Current or Ex-Partner:   . Emotionally Abused:   Marland Kitchen Physically Abused:   . Sexually Abused:      Family History  Family history unknown: Yes    ROS: Otherwise  negative unless mentioned in HPI  Physical Examination  Vitals:   06/08/19 1418 06/08/19 1526  BP: (!) 131/99 129/66  Pulse: 80 87  Resp: 20 (!) 22  Temp: 98.1 F (36.7 C) 98.4 F (36.9 C)  SpO2: 100% 99%   Body mass index is 26.16 kg/m.  General:  WDWN in NAD Gait: Not observed HENT: WNL, normocephalic Pulmonary: normal non-labored breathing, without Rales, rhonchi,  wheezing Cardiac: regular Abdomen:  soft, NT/ND, no masses Skin: without rashes Vascular Exam/Pulses: Symmetrical radial pulses Extremities: Easily palpable thrill throughout left arm AV fistula to the level of the upper arm Musculoskeletal: no  muscle wasting or atrophy  Neurologic: A&O X 3;  No focal weakness or paresthesias are detected; speech is fluent/normal Psychiatric:  The pt has Normal affect. Lymph:  Unremarkable  CBC    Component Value Date/Time   WBC 11.2 (H) 06/08/2019 0520   RBC 3.76 (L) 06/08/2019 0520   HGB 11.9 (L) 06/08/2019 0520   HCT 38.0 06/08/2019 0520   PLT 309 06/08/2019 0520   MCV 101.1 (H) 06/08/2019 0520   MCH 31.6 06/08/2019 0520   MCHC 31.3 06/08/2019 0520   RDW 18.4 (H) 06/08/2019 0520   LYMPHSABS 1.0 06/07/2019 1116   MONOABS 0.6 06/07/2019 1116   EOSABS 0.0 06/07/2019 1116   BASOSABS 0.0 06/07/2019 1116    BMET    Component Value Date/Time   NA 131 (L) 06/08/2019 0520   K 7.0 (HH) 06/08/2019 0520   CL 93 (L) 06/08/2019 0520   CO2 12 (L) 06/08/2019 0520   GLUCOSE 169 (H) 06/08/2019 0520   BUN 120 (H) 06/08/2019 0520   CREATININE 14.25 (H) 06/08/2019 0520   CALCIUM 9.2 06/08/2019 0520   GFRNONAA 2 (L) 06/08/2019 0520   GFRAA 3 (L) 06/08/2019 0520    COAGS: Lab Results  Component Value Date   INR 1.3 (H) 06/08/2019   INR 1.2 08/24/2018   INR 1.1 08/21/2018      ASSESSMENT/PLAN: This is a 74 y.o. female with transposed left arm basilic vein fistula experiencing difficulty with cannulation  By my exam left arm fistula has a easily palpable thrill  throughout its course in the upper arm as it approaches the axilla Will discuss utility of fistulogram with on-call vascular surgeon Dr. Donzetta Matters before abandoning access   Dagoberto Ligas PA-C [facility] [phone]  I have independently interviewed and examined patient and agree with PA assessment and plan above.  Unfortunately she is only minimally conversive on my exam and from the past she has been fully oriented.  Noted plans for possible OR tomorrow for repair of hip fracture.  She will need fistulogram with possible intervention of her left upper extremity while she is here but will wait until she is more medically stable and her hip has been repaired.  We will follow peripherally and plan in the coming days.  Kyan Yurkovich C. Donzetta Matters, MD [facility] [place] Office: [phone] Pager: [phone]

## 2019-06-08 NOTE — ED Notes (Signed)
Pt given warm blanket for comfort, readjusted in bed to alleviate pressure to left side. No additional complaints or requests at this time

## 2019-06-08 NOTE — Consult Note (Addendum)
Chief Complaint: Patient was seen in consultation today for tunneled vs non tunneled dialysis catheter placement Chief Complaint  Patient presents with  . Fall  . Missed Dialysis   at the request of Dr Mickel Crow    Supervising Physician: Corrie Mckusick  Patient Status: [facility] - ED  History of Present Illness: Bianca Myers is a 74 y.o. female   Hx DM; HTN ESRD Pt has been very confused for few weeks per Evette Cristal (via phone) She fell at home and now with left fractured hip- yesterday  She has hx MSSA Jan 2021--- ? From a dialysis catheter? Treated-  Left AV fistula not working well for weeks; Last dialysis 06/03/19 She was seen in dialysis 4/4- but left secondary frustration of Tech not being able to cannulize fistula May have then had a syncopal episode at home-- remember waking up in kitchen  Request now for tunneled dialysis catheter placement Afeb; wbc 11(16) K 7 this am [facility] pending  Past Medical History:  Diagnosis Date  . Anemia   . Arthritis   . Chronic kidney disease (CKD) stage G5/A1, glomerular filtration rate (GFR) less than or equal to 15 mL/min/1.73 square meter and albuminuria creatinine ratio less than 30 mg/g (HCC)   . Depression   . Diabetes mellitus ([place])   . Gout   . History of anemia due to chronic kidney disease   . History of degenerative disc disease   . Hypertension   . Pneumonia   . Secondary hyperparathyroidism ([place])   . Spondylisthesis     Past Surgical History:  Procedure Laterality Date  . A/V FISTULAGRAM Left 12/16/2018   Procedure: A/V FISTULAGRAM;  Surgeon: Marty Heck, MD;  Location: [place] [facility];  Service: Cardiovascular;  Laterality: Left;  . AV FISTULA PLACEMENT Left 08/24/2018   Procedure: ARTERIOVENOUS (AV) FISTULA CREATION;  Surgeon: Waynetta Sandy, MD;  Location: [place];  Service: Vascular;  Laterality: Left;  . BASCILIC VEIN TRANSPOSITION Left 11/04/2018   Procedure: BASCILIC VEIN  TRANSPOSITION SECOND STAGE;  Surgeon: Waynetta Sandy, MD;  Location: [place];  Service: Vascular;  Laterality: Left;  . COLONOSCOPY    . HIP SURGERY    . IR FLUORO GUIDE CV LINE RIGHT  08/21/2018  . IR REMOVAL TUN CV CATH W/O FL  03/11/2019  . IR US GUIDE VASC ACCESS RIGHT  08/21/2018  . PERIPHERAL VASCULAR INTERVENTION Left 12/16/2018   Procedure: PERIPHERAL VASCULAR INTERVENTION;  Surgeon: Marty Heck, MD;  Location: [place] [facility];  Service: Cardiovascular;  Laterality: Left;  arm fistula    Allergies: Patient has no known allergies.  Medications: Prior to Admission medications   Medication Sig Start Date End Date Taking? Authorizing Provider  acetaminophen (TYLENOL) 650 MG CR tablet Take 1,300 mg by mouth every 8 (eight) hours as needed for pain.    [provider]  amLODipine (NORVASC) 5 MG tablet Take 5 mg by mouth at bedtime.    [provider]  Ascorbic Acid (VITAMIN C) 1000 MG tablet Take 1,000 mg by mouth 2 (two) times a day.     [provider]  atorvastatin (LIPITOR) 10 MG tablet Take 10 mg by mouth at bedtime. 05/14/18   [provider]  ferric citrate (AURYXIA) 1 GM 210 MG(Fe) tablet Take 420 mg by mouth 3 (three) times daily with meals.    [provider]  furosemide (LASIX) 40 MG tablet Take 1 tablet (40 mg total) by mouth every Tuesday, Thursday,  Saturday, and Sunday. Patient taking differently: Take 80 mg by mouth every Tuesday, Thursday, Saturday, and Sunday.  09/04/18   Nita Sells, MD  labetalol (NORMODYNE) 200 MG tablet Take 200 mg by mouth 2 (two) times daily.    [provider]  lidocaine-prilocaine (EMLA) cream Apply 1 application topically as directed. Prior to Dialysis 02/11/19   [provider]  multivitamin (RENA-VIT) TABS tablet Take 1 tablet by mouth at bedtime. 08/27/18   Dhungel, Flonnie Overman, MD  Omega-3 Fatty Acids (FISH OIL) 1000 MG CAPS Take 1,000 mg by mouth 2 (two) times  daily.     [provider]  polyethylene glycol (MIRALAX) 17 g packet Take 17 g by mouth daily as needed for moderate constipation. 08/27/18   Dhungel, Nishant, MD  Turmeric 500 MG CAPS Take 500 mg by mouth daily.     [provider]     Family History  Family history unknown: Yes    Social History   Socioeconomic History  . Marital status: Widowed    Spouse name: Not on file  . Number of children: Not on file  . Years of education: Not on file  . Highest education level: Not on file  Occupational History  . Not on file  Tobacco Use  . Smoking status: Former Smoker    Quit date: 1973    Years since quitting: 48.2  . Smokeless tobacco: Never Used  Substance and Sexual Activity  . Alcohol use: Not Currently  . Drug use: Never  . Sexual activity: Not on file  Other Topics Concern  . Not on file  Social History Narrative  . Not on file   Social Determinants of Health   Financial Resource Strain:   . Difficulty of Paying Living Expenses:   Food Insecurity:   . Worried About Charity fundraiser in the Last Year:   . Arboriculturist in the Last Year:   Transportation Needs:   . Film/video editor (Medical):   Marland Kitchen Lack of Transportation (Non-Medical):   Physical Activity:   . Days of Exercise per Week:   . Minutes of Exercise per Session:   Stress:   . Feeling of Stress :   Social Connections:   . Frequency of Communication with Friends and Family:   . Frequency of Social Gatherings with Friends and Family:   . Attends Religious Services:   . Active Member of Clubs or Organizations:   . Attends Archivist Meetings:   Marland Kitchen Marital Status:     Review of Systems: A 12 point ROS discussed and pertinent positives are indicated in the HPI above.  All other systems are negative.  Review of Systems  Vital Signs: BP (!) 175/92   Pulse (!) 58   Temp 97.9 F (36.6 C) (Oral)   Resp (!) 26   Ht 5\' 7"  (1.702 m)   Wt 167 lb (75.8 kg)   SpO2 100%    BMI 26.16 kg/m   Physical Exam Vitals reviewed.  Cardiovascular:     Rate and Rhythm: Normal rate and regular rhythm.     Heart sounds: Normal heart sounds.  Pulmonary:     Breath sounds: Normal breath sounds.  Abdominal:     Palpations: Abdomen is soft.  Musculoskeletal:        General: Swelling present.     Comments: Left arm swelling Left AV fistula +pulse; no thrill  Skin:    General: Skin is warm and dry.  Neurological:  Mental Status: She is disoriented.  Psychiatric:     Comments: Consented with her son Brenton Grills via phone     Imaging: CT ABDOMEN PELVIS WO CONTRAST  Result Date: 06/07/2019 CLINICAL DATA:  Abdominal trauma syncope, fall EXAM: CT CHEST WITHOUT CONTRAST TECHNIQUE: Multidetector CT imaging of the chest was performed following the standard protocol without IV contrast. COMPARISON:  October 28, 2013 FINDINGS: Cardiovascular: There is mild cardiomegaly. No significant pericardial fluid/thickening. Great vessels are normal in course and caliber. Limited evaluation of the vascular structures due to the lack of intravenous contrast. Scattered aortic atherosclerosis. Coronary artery calcifications are present. Mediastinum/Nodes: No pneumomediastinum. No large mediastinal hematoma. The trachea and esophagus are grossly unremarkable. Lungs/Pleura:Scattered areas of bronchiectasis with architectural distortion are seen predominantly within both lung apices and at the posterior lower lungs. Centrilobular emphysematous changes seen at both lung apices. No pneumothorax. No pleural effusion. Musculoskeletal: Nondisplaced fractures are seen through the anterior right fourth and fifth ribs. Abdomen/pelvis: Hepatobiliary: Homogeneous hepatic attenuation without traumatic injury. No focal lesion. Gallbladder physiologically distended, no calcified stone. No biliary dilatation. Pancreas: No evidence for traumatic injury. Portions are partially obscured by adjacent bowel loops and  paucity of intra-abdominal fat. No ductal dilatation or inflammation. Spleen: Again noted is a 5 cm low-density lesion in the posterior spleen. Normal in size. Adrenals/Urinary Tract: No adrenal hemorrhage. Bilateral renal atrophy is noted. There is a partially visualized low-density lesions seen within the lower pole the left kidney measuring 1.2 cm. Bladder is physiologically distended without wall thickening. Stomach/Bowel: Suboptimally assessed without enteric contrast, allowing for this, no evidence of bowel injury. Stomach physiologically distended. There are no dilated or thickened small or large bowel loops. Moderate stool burden. Scattered colonic diverticula are noted. No evidence of mesenteric hematoma. No free air free fluid. Vascular/Lymphatic: No acute vascular injury. The abdominal aorta and IVC are intact. No evidence of retroperitoneal, abdominal, or pelvic adenopathy. Scattered aortic atherosclerosis is noted. Reproductive: No acute abnormality. Partially calcified posterior uterine fibroid is seen. Other: No focal contusion or abnormality of the abdominal wall. A small fat containing anterior umbilical hernia is noted. Musculoskeletal: There is a comminuted mildly displaced left transoral femoral neck fracture. The femoral head is still well seated within the acetabulum. The patient is status post ORIF with plate and screw fixation of the posterior right acetabulum. There is a grade 1 anterolisthesis of L5 on S1. There is progression of endplate sclerosis and reactive changes with erosions at L5-S1. IMPRESSION: 1. No acute intrathoracic, abdominal, or pelvic injury, h wall a owever somewhat limited due to lack of intravenous contrast. 2. Nondisplaced fractures through the right anterior fourth and fifth ribs. 3. Areas of chronic interstitial lung changes and scarring seen throughout both lungs with centrilobular emphysematous changes at the lung apices. 4. Diverticulosis without diverticulitis. 5.   Aortic Atherosclerosis (ICD10-I70.0). 6. Comminuted mildly displaced left transcervical femoral neck fracture. 7. Grade 1 anterolisthesis of L5 on S1 with advanced degenerative changes as on prior radiograph dating back to 2019. Electronically Signed   By: Prudencio Pair M.D.   On: 06/07/2019 13:16   DG Chest 1 View  Result Date: 06/07/2019 CLINICAL DATA:  Syncopal episode. EXAM: CHEST  1 VIEW COMPARISON:  04/08/2019 chest radiograph and prior. FINDINGS: Overlying telemetry wires obscure visualization of the chest. Bilateral upper lung scarring and right predominant volume loss, unchanged. Patchy and streaky bibasilar opacities likely reflect atelectasis. No pneumothorax or pleural effusion. Cardiomediastinal silhouette is grossly unchanged. Partially imaged left upper extremity vascular stent.  Osteopenia and multilevel spondylosis. Aortic atherosclerotic calcifications. IMPRESSION: No active airspace disease. Biapical scarring with right upper lung volume loss, grossly unchanged. Electronically Signed   By: Primitivo Gauze M.D.   On: 06/07/2019 12:44   DG Pelvis 1-2 Views  Result Date: 06/07/2019 CLINICAL DATA:  Syncope, fall, left hip pain EXAM: PELVIS - 1-2 VIEW; LEFT FEMUR 2 VIEWS COMPARISON:  None. FINDINGS: Osteopenia. There is a displaced transcervical fracture of the left femoral neck. No fracture or dislocation of the distal left femur. Plate and screw fixation of the right acetabulum. No acute displaced fracture or dislocation of the pelvis or proximal right femur in frontal view only. IMPRESSION: 1. There is a displaced transcervical fracture of the left femoral neck. 2.  No fracture or dislocation of the distal left femur. 3. Osteopenia, which limits sensitivity for additional fractures of the pelvis. Electronically Signed   By: Eddie Candle M.D.   On: 06/07/2019 12:32   CT Head Wo Contrast  Result Date: 06/07/2019 CLINICAL DATA:  Fall at home.  Head injury EXAM: CT HEAD WITHOUT CONTRAST CT  CERVICAL SPINE WITHOUT CONTRAST TECHNIQUE: Multidetector CT imaging of the head and cervical spine was performed following the standard protocol without intravenous contrast. Multiplanar CT image reconstructions of the cervical spine were also generated. COMPARISON:  None. FINDINGS: CT HEAD FINDINGS Brain: Mild atrophy without hydrocephalus. Patchy white matter hypodensity diffusely compatible with chronic microvascular ischemia. No acute hemorrhage, mass, or infarction. Small interhemispheric lipomas are present along the falx. This is felt to be incidental. Corpus callosum normally formed. Vascular: Negative for hyperdense vessel Skull: Negative Sinuses/Orbits: Paranasal sinuses clear. Bilateral cataract extraction. Other: None CT CERVICAL SPINE FINDINGS Alignment: Normal alignment with straightening of the cervical lordosis Skull base and vertebrae: Negative for fracture Soft tissues and spinal canal: No soft tissue mass or edema. Disc levels: Disc degeneration and spurring C4-5 and C5-6. Moderate right foraminal stenosis at C5-6 due to spurring. Mild disc degeneration and spurring on the left at C6-7. Upper chest: CT chest reported separately. Other: None IMPRESSION: 1. No acute intracranial abnormality 2. Negative for cervical spine fracture. Mild to moderate cervical spondylosis. Electronically Signed   By: Franchot Gallo M.D.   On: 06/07/2019 12:57   CT Chest Wo Contrast  Result Date: 06/07/2019 CLINICAL DATA:  Abdominal trauma syncope, fall EXAM: CT CHEST WITHOUT CONTRAST TECHNIQUE: Multidetector CT imaging of the chest was performed following the standard protocol without IV contrast. COMPARISON:  October 28, 2013 FINDINGS: Cardiovascular: There is mild cardiomegaly. No significant pericardial fluid/thickening. Great vessels are normal in course and caliber. Limited evaluation of the vascular structures due to the lack of intravenous contrast. Scattered aortic atherosclerosis. Coronary artery  calcifications are present. Mediastinum/Nodes: No pneumomediastinum. No large mediastinal hematoma. The trachea and esophagus are grossly unremarkable. Lungs/Pleura:Scattered areas of bronchiectasis with architectural distortion are seen predominantly within both lung apices and at the posterior lower lungs. Centrilobular emphysematous changes seen at both lung apices. No pneumothorax. No pleural effusion. Musculoskeletal: Nondisplaced fractures are seen through the anterior right fourth and fifth ribs. Abdomen/pelvis: Hepatobiliary: Homogeneous hepatic attenuation without traumatic injury. No focal lesion. Gallbladder physiologically distended, no calcified stone. No biliary dilatation. Pancreas: No evidence for traumatic injury. Portions are partially obscured by adjacent bowel loops and paucity of intra-abdominal fat. No ductal dilatation or inflammation. Spleen: Again noted is a 5 cm low-density lesion in the posterior spleen. Normal in size. Adrenals/Urinary Tract: No adrenal hemorrhage. Bilateral renal atrophy is noted. There is a partially visualized  low-density lesions seen within the lower pole the left kidney measuring 1.2 cm. Bladder is physiologically distended without wall thickening. Stomach/Bowel: Suboptimally assessed without enteric contrast, allowing for this, no evidence of bowel injury. Stomach physiologically distended. There are no dilated or thickened small or large bowel loops. Moderate stool burden. Scattered colonic diverticula are noted. No evidence of mesenteric hematoma. No free air free fluid. Vascular/Lymphatic: No acute vascular injury. The abdominal aorta and IVC are intact. No evidence of retroperitoneal, abdominal, or pelvic adenopathy. Scattered aortic atherosclerosis is noted. Reproductive: No acute abnormality. Partially calcified posterior uterine fibroid is seen. Other: No focal contusion or abnormality of the abdominal wall. A small fat containing anterior umbilical hernia is  noted. Musculoskeletal: There is a comminuted mildly displaced left transoral femoral neck fracture. The femoral head is still well seated within the acetabulum. The patient is status post ORIF with plate and screw fixation of the posterior right acetabulum. There is a grade 1 anterolisthesis of L5 on S1. There is progression of endplate sclerosis and reactive changes with erosions at L5-S1. IMPRESSION: 1. No acute intrathoracic, abdominal, or pelvic injury, h wall a owever somewhat limited due to lack of intravenous contrast. 2. Nondisplaced fractures through the right anterior fourth and fifth ribs. 3. Areas of chronic interstitial lung changes and scarring seen throughout both lungs with centrilobular emphysematous changes at the lung apices. 4. Diverticulosis without diverticulitis. 5.  Aortic Atherosclerosis (ICD10-I70.0). 6. Comminuted mildly displaced left transcervical femoral neck fracture. 7. Grade 1 anterolisthesis of L5 on S1 with advanced degenerative changes as on prior radiograph dating back to 2019. Electronically Signed   By: Prudencio Pair M.D.   On: 06/07/2019 13:16   CT Cervical Spine Wo Contrast  Result Date: 06/07/2019 CLINICAL DATA:  Fall at home.  Head injury EXAM: CT HEAD WITHOUT CONTRAST CT CERVICAL SPINE WITHOUT CONTRAST TECHNIQUE: Multidetector CT imaging of the head and cervical spine was performed following the standard protocol without intravenous contrast. Multiplanar CT image reconstructions of the cervical spine were also generated. COMPARISON:  None. FINDINGS: CT HEAD FINDINGS Brain: Mild atrophy without hydrocephalus. Patchy white matter hypodensity diffusely compatible with chronic microvascular ischemia. No acute hemorrhage, mass, or infarction. Small interhemispheric lipomas are present along the falx. This is felt to be incidental. Corpus callosum normally formed. Vascular: Negative for hyperdense vessel Skull: Negative Sinuses/Orbits: Paranasal sinuses clear. Bilateral  cataract extraction. Other: None CT CERVICAL SPINE FINDINGS Alignment: Normal alignment with straightening of the cervical lordosis Skull base and vertebrae: Negative for fracture Soft tissues and spinal canal: No soft tissue mass or edema. Disc levels: Disc degeneration and spurring C4-5 and C5-6. Moderate right foraminal stenosis at C5-6 due to spurring. Mild disc degeneration and spurring on the left at C6-7. Upper chest: CT chest reported separately. Other: None IMPRESSION: 1. No acute intracranial abnormality 2. Negative for cervical spine fracture. Mild to moderate cervical spondylosis. Electronically Signed   By: Franchot Gallo M.D.   On: 06/07/2019 12:57   DG FEMUR MIN 2 VIEWS LEFT  Result Date: 06/07/2019 CLINICAL DATA:  Syncope, fall, left hip pain EXAM: PELVIS - 1-2 VIEW; LEFT FEMUR 2 VIEWS COMPARISON:  None. FINDINGS: Osteopenia. There is a displaced transcervical fracture of the left femoral neck. No fracture or dislocation of the distal left femur. Plate and screw fixation of the right acetabulum. No acute displaced fracture or dislocation of the pelvis or proximal right femur in frontal view only. IMPRESSION: 1. There is a displaced transcervical fracture of the left femoral  neck. 2.  No fracture or dislocation of the distal left femur. 3. Osteopenia, which limits sensitivity for additional fractures of the pelvis. Electronically Signed   By: Eddie Candle M.D.   On: 06/07/2019 12:32    Labs:  CBC: Recent Labs    03/14/19 0410 03/15/19 0629 06/07/19 1116 06/08/19 0520  WBC 9.8 9.2 16.1* 11.2*  HGB 8.2* 9.1* 12.5 11.9*  HCT 25.5* 28.3* 40.8 38.0  PLT 319 350 286 309    COAGS: Recent Labs    08/21/18 1145 08/24/18 0542 06/08/19 0603  INR 1.1 1.2 1.3*    BMP: Recent Labs    03/14/19 0410 03/15/19 0629 06/07/19 1116 06/08/19 0520  NA 131* 133* 134* 131*  K 4.0 3.8 6.9* 7.0*  CL 92* 94* 96* 93*  CO2 23 25 14* 12*  GLUCOSE 109* 112* 129* 169*  BUN 62* 47* 100* 120*    CALCIUM 8.2* 8.4* 9.2 9.2  CREATININE 11.00* 8.42* 13.10* 14.25*  GFRNONAA 3* 4* 2* 2*  GFRAA 4* 5* 3* 3*    LIVER FUNCTION TESTS: Recent Labs    08/20/18 1324 08/25/18 0755 02/28/19 0952 02/28/19 0952 03/09/19 1037 03/10/19 0358 03/11/19 0708 03/12/19 0557 03/13/19 0212 06/07/19 1116  BILITOT 0.8  --  0.8  --  0.4  --   --   --   --  0.9  AST 9*  --  25  --  15  --   --   --   --  29  ALT 10  --  21  --  19  --   --   --   --  <5  ALKPHOS 47  --  55  --  75  --   --   --   --  84  PROT 6.3*  --  6.8  --  7.2  --   --   --   --  7.9  ALBUMIN 3.3*   < > 2.9*   < > 2.1*   < > 1.8* 1.9* 1.8* 3.4*   < > = values in this interval not displayed.    TUMOR MARKERS: No results for input(s): AFPTM, CEA, CA199, CHROMGRNA in the last 8760 hours.  Assessment and Plan:  ESRD Left upper arm fistula - malfunction Difficult to cannulize; last complete dialysis 06/03/19 +broken Left hip now Hx MSSA Jan 2021-- ?dialysis catheter?---Afeb in ED; Wbc 11 now (16)  request for tunneled dialysis catheter placement-- consented for tunneled vs non tunneled dialysis catheter placement  Risks and benefits discussed with the patient's son Jermaine via phone including, but not limited to bleeding, infection, vascular injury, pneumothorax which may require chest tube placement, air embolism or even death  All questions were answered, he is agreeable to proceed. Consent signed and in chart.  Thank you for this interesting consult.  I greatly enjoyed meeting Fumie Fiallo and look forward to participating in their care.  A copy of this report was sent to the requesting provider on this date.   Discussed with Dr Earleen Newport-- will place temporary catheter today--  Electronically Signed: Lavonia Drafts, PA-C 06/08/2019, 7:30 AM   I spent a total of 20 Minutes    in face to face in clinical consultation, greater than 50% of which was counseling/coordinating care for HD catheter placement

## 2019-06-08 NOTE — ED Notes (Signed)
Pt transported to IR 

## 2019-06-08 NOTE — Progress Notes (Signed)
[facility] Progress Note  Subjective: seen in room, pt sweaty, not speaking, eyes open, some shaking/ jerking of UE's off and on  Vitals:   06/08/19 1230 06/08/19 1300 06/08/19 1309 06/08/19 1418  BP: 139/72 128/88 98/73 (!) 131/99  Pulse: 95 89 89 80  Resp:    20  Temp:   98.3 F (36.8 C) 98.1 F (36.7 C)  TempSrc:   Oral Axillary  SpO2:   97% 100%  Weight:      Height:        Exam: General: Eldely female, awake but not responding verbally, clenched fists, some shaking of L arm off and on Head: NCAT sclera not icteric  Neck: Supple. No JVD No masses Lungs: CTA bilaterally without wheezes, rales, or rhonchi Heart: RRR with S1 S2 Abdomen: soft NT + BS Lower extremities:without edema or ischemic changes, no open wounds  Neuro: as above, not speaking, eyes open, follows some commands Psych:  Responds to questions appropriately with a normal affect. Dialysis Access: LUE AVF +bruit     Dialysis:  [facility] MWF  4h 350/800 EDW 77kg 2K/2.25Ca UFP 4 AVF Heparin 2000 Mircera 200 (last 3/31)   Assessment/Plan: 1. Confusion - not sure cause, not speaking, jerking/ shaking, evaluating now. She was not like this yest in ED. Some of this may be uremia w/ her high B/Cr, but did have HD this am.  2. Hyperkalemia - had acute measures and had HD this am. Recheck now.  3. Left femoral neck fracture s/p syncopal event at home.  Per primary/ortho.  4. ESRD -  HD MWF. Missed HD Monday d/t difficult cannulation of AVF. Unable to cannulate fistula yesterday. K+ 7 this am, appreciate IR placement of temp cath.  Had HD this am. High BUN/ creat , will plan HD again tomorrow.  5. Hypertension/volume  - Hypertensive on arrival. Now controlled. On amlodipine 5, labetalol 200 bid. 6.  Anemia  - Hgb 12.5. No ESA needs.  7.  Metabolic bone disease -  Continue Auryixa binders/VDRA  8.  Nutrition - Renal diet/fluid restriction when eating.      Rob Acheron Sugg 06/08/2019, 3:22 PM   Recent  Labs  Lab 06/07/19 1116 06/08/19 0520  K 6.9* 7.0*  BUN 100* 120*  CREATININE 13.10* 14.25*  CALCIUM 9.2 9.2  HGB 12.5 11.9*   Inpatient medications: . albuterol  2.5 mg Nebulization Once  . Chlorhexidine Gluconate Cloth  6 each Topical Q0600  . heparin      . heparin injection (subcutaneous)  5,000 Units Subcutaneous Q8H  . lidocaine      . sodium zirconium cyclosilicate  10 g Oral Once

## 2019-06-08 NOTE — Procedures (Signed)
Patient Name: Bianca Myers  MRN: [id number]  Epilepsy Attending: Lora Havens  Referring Physician/Provider: Dr Aline August Date: 06/08/2019 Duration: 24.58 mins  Patient history: 73yo F with ams and twitching movements of bilateral upper extremities EEG to evaluate for seizure.   Level of alertness: awake  AEDs during EEG study: None  Technical aspects: This EEG study was done with scalp electrodes positioned according to the 10-20 International system of electrode placement. Electrical activity was acquired at a sampling rate of 500Hz  and reviewed with a high frequency filter of 70Hz  and a low frequency filter of 1Hz . EEG data were recorded continuously and digitally stored.   DESCRIPTION: No clear posterior dominant rhythm was seen. EEG showed continuous generalized polymorphic 3-6hz  theta-delta slowing. Photic driving was not seen during photic stimulation. Hyperventilation was not performed.  ABNORMALITY - Continuous slow, generalized   IMPRESSION: This study is suggestive of moderate diffuse encephalopathy, non specific to etiology. No seizures or epileptiform discharges were seen throughout the recording.  Bianca Myers

## 2019-06-08 NOTE — Progress Notes (Signed)
HD catheter placed and tolerated well. Dressing over catheter right chest clean dry and intact. Transferred to [facility] ED and report given to [place] RN. Was monitored for procedure, narrow complex QRS and no ectopy. VS stable.

## 2019-06-08 NOTE — Progress Notes (Signed)
EEG complete - results pending 

## 2019-06-08 NOTE — Significant Event (Signed)
Rapid Response Event Note  Overview: Time Called: 1520 Arrival Time: 1530 Event Type: Neurologic Pt recently admitted. This morning went to dialysis and after treatment pt was admitted to [facility].   Initial Focused Assessment: Pt lying in bed, alert. Twitching noted in her arms and head/neck. Pt does not appear to be in distress, but her face appears frightened. Pt responds "hey" to me when I walked into the room. Pt does not answer when asked her name or what month it is. Pt blinks to threat from all visual fields. No facial palsy noted, pt will not smile. PERRLA, EOMI. Pt is able turn her head to the left and right side. Left pupil is 41mm, right pupil is 72mm. Pt is able to move all extremities, but prefers to use her left hand to follow commands. Pt grimaces to pain in all extremities. Lung sounds are clear, diminished. Expressive aphasia +. No extinction or inattention.  Per report from the RN taking care of her in the ED and the RN who took care of her in dialysis, since admission pt's mentation waxes and wanes. Pt appears alert and oriented, and engages in conversation intermittently. Other times she minimally engages in communication. Pt is also noted to be intermittently twitching.   Interventions: No interventions from rapid response RN  Orders received from Dr. Starla Link, MD at bedside to assess pt -Neurology consult- Etta Quill, PA at bedside to evaluate pt -EEG -MRI -CMP, CBC, ammonia  Plan of Care (if not transferred): -Follow-up with attending results of studies ordered -Frequent neuro assessments  Event Summary: Name of Physician Notified: Dr. Starla Link at 1540 Outcome: Stayed in room and stabalized Event End Time: [place]

## 2019-06-08 NOTE — Consult Note (Signed)
Neurology Consultation  Reason for Consult: Abnormal movements possible seizure Referring Physician: Aline August, MD  CC: Abnormal movements  History is obtained from: Chart as patient is unable to give any history   HPI: Bianca Myers is a 74 y.o. female with history of hypertension, degenerative disc disease, chronic kidney disease with dialysis, and diabetes.  Patient initially came in secondary to fall/hip pain.  While in the ED it was notable that she had significant electrolyte abnormalities including a potassium 6.9, BUN of 100, creatinine of 13 and a WBC of 16.  Patient was found to have a communicated mildly displaced left femoral neck fracture.  Along with nondisplaced fractures through the right anterior fourth and fifth ribs.  Today patient was noticed to have abnormal jerking movements and there was a question of possible status epilepticus versus seizure.  For that reason neurology was consulted.  Today on consultation patient is very confused, her labs shows a sodium of 131, potassium 7.0, BUN of 120, creatinine 14.25, CBC is showing a decrease in white blood cell count of 11.2.  And ammonia mildly elevated at 37.  Upon walking in the room patient is awake and repeats what I say but does not follow any commands.    ED course  Relevant labs include -labs as noted above CT head shows-no acute intracranial abnormalities CT cervical spine showed negative for cervical spine fracture  Chart review-no previous neurological history and/or notes in chart review   Past Medical History:  Diagnosis Date  . Anemia   . Arthritis   . Chronic kidney disease (CKD) stage G5/A1, glomerular filtration rate (GFR) less than or equal to 15 mL/min/1.73 square meter and albuminuria creatinine ratio less than 30 mg/g (HCC)   . Depression   . Diabetes mellitus ([place])   . Gout   . History of anemia due to chronic kidney disease   . History of degenerative disc disease   . Hypertension   .  Pneumonia   . Secondary hyperparathyroidism ([place])   . Spondylisthesis     Family History  Family history unknown: Yes   Social History:   reports that she quit smoking about 48 years ago. She has never used smokeless tobacco. She reports previous alcohol use. She reports that she does not use drugs.  Medications  Current Facility-Administered Medications:  .  albuterol (PROVENTIL) (2.5 MG/3ML) 0.083% nebulizer solution 2.5 mg, 2.5 mg, Nebulization, Once, Alekh, Kshitiz, MD .  Chlorhexidine Gluconate Cloth 2 % PADS 6 each, 6 each, Topical, Q0600, Roney Jaffe, MD .  Derrill Memo ON 06/09/2019] Chlorhexidine Gluconate Cloth 2 % PADS 6 each, 6 each, Topical, Q0600, Roney Jaffe, MD .  heparin 1000 UNIT/ML injection, , , ,  .  heparin injection 5,000 Units, 5,000 Units, Subcutaneous, Q8H, Alekh, Kshitiz, MD .  lidocaine (XYLOCAINE) 1 % (with pres) injection, , , ,  .  sodium zirconium cyclosilicate (LOKELMA) packet 10 g, 10 g, Oral, Once, Alekh, Kshitiz, MD  ROS:  Unable to obtain due to altered mental status.    Exam: Current vital signs: BP 129/66   Pulse 87   Temp 98.4 F (36.9 C)   Resp (!) 22   Ht 5\' 7"  (1.702 m)   Wt 75.8 kg   SpO2 99%   BMI 26.16 kg/m  Vital signs in last 24 hours: Temp:  [98.1 F (36.7 C)-98.4 F (36.9 C)] 98.4 F (36.9 C) (04/07 1526) Pulse Rate:  [57-105] 87 (04/07 1526) Resp:  [20-32] 22 (04/07 1526) BP: (  98-188)/(66-108) 129/66 (04/07 1526) SpO2:  [97 %-100 %] 99 % (04/07 1526)   Constitutional: Appears well-developed and well-nourished.  Psych: Confused Eyes: No scleral injection HENT: No OP obstrucion Head: Normocephalic.  Cardiovascular: Normal rate and regular rhythm.  Respiratory: Effort normal, non-labored breathing GI: Soft.  No distension. There is no tenderness.  Skin: WDI  Neuro: Mental Status: Patient is awake and alert, will repeat what is asked or said to her.  Significantly encephalopathic.  Cannot follow any commands  and or express her self. Cranial Nerves: II: Blinks to threat bilaterally III,IV, VI: Tracks me in the room.  Pupils were very difficult to obtain secondary to patient clenching her eyes every time I attempted to look.   V: Facial sensation is symmetric to temperature VII: Facial movement is symmetric.  Patient has twitching in the buccal oral area however this is not rhythmic and more of a myoclonic movement VIII: hearing is intact to voice Motor: Patient has 5/5 strength when attempting to examine as she is withdrawing from all attempts to examine her strength and or move her extremities  Patient is positive polymyoclonus greater than the upper extremities and lower extremities.  More pronounced when arms are held antigravity  Sensory: Withdraws from pain throughout all 4 extremities however a clear inconsistent sensory exam is unable to be obtained secondary to patient's encephalopathy  Deep Tendon Reflexes: 2+ bilateral bicep reflexes, 1+ noted knee jerk however this was difficult to obtain secondary to patient not relaxing Plantars: Toes were held tonically up however with plantar stimulation she had downgoing toes bilaterally Cerebellar: Unable to obtain  Labs I have reviewed labs in epic and the results pertinent to this consultation are:   CBC    Component Value Date/Time   WBC 11.2 (H) 06/08/2019 0520   RBC 3.76 (L) 06/08/2019 0520   HGB 11.9 (L) 06/08/2019 0520   HCT 38.0 06/08/2019 0520   PLT 309 06/08/2019 0520   MCV 101.1 (H) 06/08/2019 0520   MCH 31.6 06/08/2019 0520   MCHC 31.3 06/08/2019 0520   RDW 18.4 (H) 06/08/2019 0520   LYMPHSABS 1.0 06/07/2019 1116   MONOABS 0.6 06/07/2019 1116   EOSABS 0.0 06/07/2019 1116   BASOSABS 0.0 06/07/2019 1116    CMP     Component Value Date/Time   NA 131 (L) 06/08/2019 0520   K 7.0 (HH) 06/08/2019 0520   CL 93 (L) 06/08/2019 0520   CO2 12 (L) 06/08/2019 0520   GLUCOSE 169 (H) 06/08/2019 0520   BUN 120 (H) 06/08/2019  0520   CREATININE 14.25 (H) 06/08/2019 0520   CALCIUM 9.2 06/08/2019 0520   PROT 7.9 06/07/2019 1116   ALBUMIN 3.4 (L) 06/07/2019 1116   AST 29 06/07/2019 1116   ALT <5 06/07/2019 1116   ALKPHOS 84 06/07/2019 1116   BILITOT 0.9 06/07/2019 1116   GFRNONAA 2 (L) 06/08/2019 0520   GFRAA 3 (L) 06/08/2019 0520    Lipid Panel  No results found for: CHOL, TRIG, HDL, CHOLHDL, VLDL, LDLCALC, LDLDIRECT   Imaging I have reviewed the images obtained:  CT-scan of the brain-no acute intracranial abnormality  EEG pending  Etta Quill PA-C [facility] [phone]  M-F  (9:00 am- 5:00 PM)  06/08/2019, 4:45 PM     NEUROHOSPITALIST ADDENDUM Performed a face to face diagnostic evaluation.   I have reviewed the contents of history and physical exam as documented by PA/ARNP/Resident and agree with above documentation.  I have discussed and formulated the above plan as  documented. Edits to the note have been made as needed.  74 year old female with past medical history of end-stage renal disease on dialysis, diabetes mellitus type 2, hypertension, MSSA bacteremia in 2021 sent to the ED after a fall.  On arrival her BUN was 100, creatinine 13 and potassium of 6.9.  Patient was encephalopathic and having twitching movements of bilateral upper extremities and therefore neurology was consulted due to nephrology team's concern for seizure activity despite receiving dialysis.  On my examination, patient's language significantly abnormal.  Is awake and alert but has trouble following commands, repeating sentences.  Significant paraphasic errors throughout her speech.  She does have myoclonic movements throughout her body likely uremia.  Acute metabolic encephalopathy Uremic encephalopathy Aphasia Uremic polymyoclonus   Recommendations MRI brain to r/o acute stroke Stat EEG to rule out nonconvulsive status epilepticus Continue correct electrolyte metabolic disturbances and  uremia Frequent  neurochecks   Karena Addison Aydyn Testerman MD [facility] [phone]   If 7pm to 7am, please call on call as listed on AMION.

## 2019-06-09 ENCOUNTER — Encounter (HOSPITAL_COMMUNITY)
Admission: EM | Disposition: A | Discharge status: Skilled Nursing Facility | Payer: Self-pay | Source: Home / Self Care | Attending: Internal Medicine

## 2019-06-09 DIAGNOSIS — E875 Hyperkalemia: Secondary | ICD-10-CM | POA: Diagnosis not present

## 2019-06-09 DIAGNOSIS — R55 Syncope and collapse: Secondary | ICD-10-CM | POA: Diagnosis not present

## 2019-06-09 DIAGNOSIS — S72002A Fracture of unspecified part of neck of left femur, initial encounter for closed fracture: Secondary | ICD-10-CM | POA: Diagnosis not present

## 2019-06-09 LAB — CBC WITH DIFFERENTIAL/PLATELET
Abs Immature Granulocytes: 0.04 10*3/uL (ref 0.00–0.07)
Basophils Absolute: 0 10*3/uL (ref 0.0–0.1)
Basophils Relative: 0 %
Eosinophils Absolute: 0 10*3/uL (ref 0.0–0.5)
Eosinophils Relative: 0 %
HCT: 36.7 % (ref 36.0–46.0)
Hemoglobin: 11.9 g/dL — ABNORMAL LOW (ref 12.0–15.0)
Immature Granulocytes: 0 %
Lymphocytes Relative: 10 %
Lymphs Abs: 1.1 10*3/uL (ref 0.7–4.0)
MCH: 31.7 pg (ref 26.0–34.0)
MCHC: 32.4 g/dL (ref 30.0–36.0)
MCV: 97.9 fL (ref 80.0–100.0)
Monocytes Absolute: 1 10*3/uL (ref 0.1–1.0)
Monocytes Relative: 9 %
Neutro Abs: 8.6 10*3/uL — ABNORMAL HIGH (ref 1.7–7.7)
Neutrophils Relative %: 81 %
Platelets: 283 10*3/uL (ref 150–400)
RBC: 3.75 MIL/uL — ABNORMAL LOW (ref 3.87–5.11)
RDW: 18.5 % — ABNORMAL HIGH (ref 11.5–15.5)
WBC: 10.8 10*3/uL — ABNORMAL HIGH (ref 4.0–10.5)
nRBC: 0.7 % — ABNORMAL HIGH (ref 0.0–0.2)

## 2019-06-09 LAB — BASIC METABOLIC PANEL
Anion gap: 24 — ABNORMAL HIGH (ref 5–15)
BUN: 58 mg/dL — ABNORMAL HIGH (ref 8–23)
CO2: 21 mmol/L — ABNORMAL LOW (ref 22–32)
Calcium: 8.6 mg/dL — ABNORMAL LOW (ref 8.9–10.3)
Chloride: 92 mmol/L — ABNORMAL LOW (ref 98–111)
Creatinine, Ser: 9.07 mg/dL — ABNORMAL HIGH (ref 0.44–1.00)
GFR calc Af Amer: 5 mL/min — ABNORMAL LOW (ref >60)
GFR calc non Af Amer: 4 mL/min — ABNORMAL LOW (ref >60)
Glucose, Bld: 143 mg/dL — ABNORMAL HIGH (ref 70–99)
Potassium: 4.1 mmol/L (ref 3.5–5.1)
Sodium: 137 mmol/L (ref 135–145)

## 2019-06-09 LAB — GLUCOSE, CAPILLARY: Glucose-Capillary: 143 mg/dL — ABNORMAL HIGH (ref 70–99)

## 2019-06-09 LAB — SURGICAL PCR SCREEN
MRSA, PCR: NEGATIVE
Staphylococcus aureus: NEGATIVE

## 2019-06-09 SURGERY — ARTHROPLASTY, HIP, TOTAL, ANTERIOR APPROACH
Anesthesia: Choice | Site: Hip | Laterality: Left

## 2019-06-09 MED ORDER — CHLORHEXIDINE GLUCONATE 4 % EX LIQD
60.0000 mL | Freq: Once | CUTANEOUS | Status: DC
  Filled 2019-06-09: qty 60

## 2019-06-09 MED ORDER — ATORVASTATIN CALCIUM 10 MG PO TABS
10.0000 mg | ORAL_TABLET | Freq: Every day | ORAL | Status: DC
  Administered 2019-06-09 – 2019-06-15 (×6): 10 mg via ORAL
  Filled 2019-06-09 (×7): qty 1

## 2019-06-09 MED ORDER — HYDROMORPHONE HCL 1 MG/ML IJ SOLN
0.5000 mg | INTRAMUSCULAR | Status: DC | PRN
  Administered 2019-06-09 – 2019-06-11 (×5): 0.5 mg via INTRAVENOUS
  Filled 2019-06-09 (×5): qty 1

## 2019-06-09 MED ORDER — ESCITALOPRAM OXALATE 10 MG PO TABS
10.0000 mg | ORAL_TABLET | Freq: Every day | ORAL | Status: DC
  Administered 2019-06-09 – 2019-06-16 (×8): 10 mg via ORAL
  Filled 2019-06-09 (×9): qty 1

## 2019-06-09 MED ORDER — POVIDONE-IODINE 10 % EX SWAB: 2.0000 "application " | Freq: Once | CUTANEOUS | Status: DC

## 2019-06-09 MED ORDER — HYDROCODONE-ACETAMINOPHEN 5-325 MG PO TABS
1.0000 | ORAL_TABLET | Freq: Four times a day (QID) | ORAL | Status: DC | PRN
  Administered 2019-06-09: 1 via ORAL
  Filled 2019-06-09: qty 1

## 2019-06-09 MED ORDER — HYDROMORPHONE HCL 2 MG PO TABS
1.0000 mg | ORAL_TABLET | Freq: Four times a day (QID) | ORAL | Status: DC | PRN
  Administered 2019-06-09 (×2): 2 mg via ORAL
  Filled 2019-06-09 (×3): qty 1

## 2019-06-09 MED ORDER — ACETAMINOPHEN 325 MG PO TABS
650.0000 mg | ORAL_TABLET | Freq: Three times a day (TID) | ORAL | Status: DC | PRN
  Administered 2019-06-12 – 2019-06-13 (×2): 650 mg via ORAL
  Filled 2019-06-09 (×2): qty 2

## 2019-06-09 MED ORDER — ENSURE PRE-SURGERY PO LIQD
296.0000 mL | Freq: Once | ORAL | Status: AC
  Administered 2019-06-09: 296 mL via ORAL
  Filled 2019-06-09: qty 296

## 2019-06-09 MED ORDER — HEPARIN SODIUM (PORCINE) 1000 UNIT/ML IJ SOLN
INTRAMUSCULAR | Status: AC
  Filled 2019-06-09: qty 3

## 2019-06-09 MED ORDER — TRANEXAMIC ACID 1000 MG/10ML IV SOLN
2000.0000 mg | INTRAVENOUS | Status: DC
  Filled 2019-06-09 (×2): qty 20

## 2019-06-09 MED ORDER — LABETALOL HCL 200 MG PO TABS
200.0000 mg | ORAL_TABLET | Freq: Two times a day (BID) | ORAL | Status: DC
  Administered 2019-06-09 – 2019-06-16 (×14): 200 mg via ORAL
  Filled 2019-06-09 (×16): qty 1

## 2019-06-09 MED ORDER — ACETAMINOPHEN ER 650 MG PO TBCR: 1300.0000 mg | EXTENDED_RELEASE_TABLET | Freq: Three times a day (TID) | ORAL | Status: DC | PRN

## 2019-06-09 MED ORDER — TRANEXAMIC ACID-NACL 1000-0.7 MG/100ML-% IV SOLN
1000.0000 mg | INTRAVENOUS | Status: DC
  Filled 2019-06-09: qty 100

## 2019-06-09 MED ORDER — CEFAZOLIN SODIUM-DEXTROSE 2-4 GM/100ML-% IV SOLN
2.0000 g | INTRAVENOUS | Status: AC
  Administered 2019-06-10: 2 g via INTRAVENOUS
  Filled 2019-06-09 (×2): qty 100

## 2019-06-09 MED ORDER — CEFAZOLIN SODIUM-DEXTROSE 2-4 GM/100ML-% IV SOLN: 2.0000 g | INTRAVENOUS | Status: DC

## 2019-06-09 MED ORDER — FERRIC CITRATE 1 GM 210 MG(FE) PO TABS
420.0000 mg | ORAL_TABLET | Freq: Three times a day (TID) | ORAL | Status: DC
  Administered 2019-06-09 – 2019-06-16 (×12): 420 mg via ORAL
  Filled 2019-06-09 (×14): qty 2

## 2019-06-09 MED ORDER — TRANEXAMIC ACID 1000 MG/10ML IV SOLN
2000.0000 mg | INTRAVENOUS | Status: DC
  Filled 2019-06-09: qty 20

## 2019-06-09 MED ORDER — CHLORHEXIDINE GLUCONATE 4 % EX LIQD
60.0000 mL | Freq: Once | CUTANEOUS | Status: AC
  Administered 2019-06-09: 4 via TOPICAL

## 2019-06-09 NOTE — Progress Notes (Signed)
Patient originally scheduled to undergo left hip replacement this morning by Dr. Percell Miller but given need for HD this morning, surgery will need to be postponed until tomorrow.  I will plan to perform the surgery tomorrow afternoon.  NPO after midnight.  Patient may have a diet today.  Azucena Cecil, MD [facility] [phone] 8:20 AM

## 2019-06-09 NOTE — Progress Notes (Signed)
Patient ID: Bianca Myers, female   DOB: 1946-01-16, 74 y.o.   MRN: [id number]  PROGRESS NOTE    Bianca Myers  LXB:[id number] DOB: 03/30/1945 DOA: 06/07/2019 PCP: [name], MD   Brief Narrative:  74 year old female with history of end-stage renal disease on hemodialysis, diabetes mellitus type 2, hypertension, DJD, MSSA bacteremia in January 2021 presented on 06/07/2019 after a fall.  Apparently, she went to her dialysis center and as per schedule on 06/06/2019 but could not get her dialysis done due to issues with her AV fistula, subsequently went back home and then subsequently fell down, unclear if she had a syncopal event.  In the ED, patient was confused with creatinine of 13, potassium of 6.9 and bicarb of 14.  Nephrology was consulted.  She was also found to have left hip fracture for which orthopedics was consulted  Assessment & Plan:  Left hip fracture status post fall -Orthopedics following and planning for surgical intervention once medically stable, scheduled for tomorrow. -Fall precautions.  Pain management -She will medically be ready tomorrow after another session of dialysis today.  Keep n.p.o. past midnight.  Hyperkalemia End-stage renal disease on hemodialysis Uremia acute metabolic encephalopathy -Presented with hyperkalemia and uremia with confusion most likely from uremia.  Patient had difficulty cannulation of fistula recently and missed her dialysis as an outpatient recently.  Nephrology following. -Status post IR guided [facility] HD catheter placement today.  Dialysis as per nephrology schedule. -Continues to receive additional dialysis today. -Monitor mental status.  Fall precautions.  Mental status improving.  Appreciate neurology input.  MRI of the brain and EEG were normal.  Leukocytosis -Probably reactive.  Improving  Mildly elevated high-sensitivity troponin -No chest pain.  Likely secondary to ESRD.  No further work-up needed  Hypertension -Monitor  blood pressure.  Stable.  Anemia of chronic disease -From renal failure.  Monitor  Generalized deconditioning -We will need PT evaluation and may need rehab after left hip surgery.  DVT prophylaxis: Heparin Code Status: Full Family Communication: Spoke to patient at bedside.  Disposition Plan: Will need to remain inpatient for management of hyperkalemia or hip fracture.  If surgery tomorrow. Might need SNF placement afterwards.  Consultants: Orthopedic/nephrology  Procedures:  IR guided [facility] HD catheter placement on 06/08/2019  Antimicrobials:  None  Subjective: Patient seen and examined.  She still has some shakiness, however she feels almost back to herself.  Left hip hurts and she is not able to move it.  No other overnight events.  Mental status has slowly cleared up. Objective: Vitals:   06/08/19 2259 06/09/19 0212 06/09/19 0500 06/09/19 0955  BP: (!) 149/91 (!) 144/85 (!) 148/80 (!) 145/77  Pulse: 82 81 84 100  Resp: 18 18 18 19   Temp: 98 F (36.7 C) 98 F (36.7 C) 98 F (36.7 C)   TempSrc:   Oral   SpO2: 100% 99% 99% 98%  Weight:      Height:        Intake/Output Summary (Last 24 hours) at 06/09/2019 1012 Last data filed at 06/09/2019 0500 Gross per 24 hour  Intake 0 ml  Output 2500 ml  Net -2500 ml   Filed Weights   06/07/19 1037  Weight: 75.8 kg    Examination:  General exam: Appears calm and comfortable.  Chronically sick looking.  Not in any distress.  On room air. Respiratory system: Bilateral clear. Cardiovascular system: S1 & S2 heard, Rate controlled Gastrointestinal system: Abdomen is nondistended, soft and nontender. Normal bowel sounds  heard. Extremities: No cyanosis, clubbing; trace lower extremity edema Central nervous system: Alert awake x3.  Skin: No rashes, lesions or ulcers Psychiatry: Normal mood.  Flat affect.    Data Reviewed: I have personally reviewed following labs and imaging studies  CBC: Recent Labs  Lab 06/07/19 1116  06/08/19 0520 06/08/19 1915 06/09/19 0336  WBC 16.1* 11.2* 12.0* 10.8*  NEUTROABS 14.3*  --  9.9* 8.6*  HGB 12.5 11.9* 13.2 11.9*  HCT 40.8 38.0 41.0 36.7  MCV 103.6* 101.1* 97.9 97.9  PLT 286 309 290 643   Basic Metabolic Panel: Recent Labs  Lab 06/07/19 1116 06/07/19 1725 06/08/19 0520 06/08/19 1551 06/09/19 0336  NA 134*  --  131* 135 137  K 6.9*  --  7.0* 5.4* 4.1  CL 96*  --  93* 94* 92*  CO2 14*  --  12* 14* 21*  GLUCOSE 129*  --  169* 144* 143*  BUN 100*  --  120* 44* 58*  CREATININE 13.10*  --  14.25* 7.63* 9.07*  CALCIUM 9.2  --  9.2 8.8* 8.6*  MG  --  2.2  --   --   --    GFR: Estimated Creatinine Clearance: 5.9 mL/min (A) (by C-G formula based on SCr of 9.07 mg/dL (H)). Liver Function Tests: Recent Labs  Lab 06/07/19 1116 06/08/19 1551  AST 29 55*  ALT <5 <5  ALKPHOS 84 77  BILITOT 0.9 1.1  PROT 7.9 8.0  ALBUMIN 3.4* 3.5   Recent Labs  Lab 06/07/19 1116  LIPASE 29   Recent Labs  Lab 06/08/19 1551  AMMONIA 37*   Coagulation Profile: Recent Labs  Lab 06/08/19 0603  INR 1.3*   Cardiac Enzymes: No results for input(s): CKTOTAL, CKMB, CKMBINDEX, TROPONINI in the last 168 hours. BNP (last 3 results) No results for input(s): PROBNP in the last 8760 hours. HbA1C: No results for input(s): HGBA1C in the last 72 hours. CBG: Recent Labs  Lab 06/08/19 1522 06/09/19 0749  GLUCAP 168* 143*   Lipid Profile: No results for input(s): CHOL, HDL, LDLCALC, TRIG, CHOLHDL, LDLDIRECT in the last 72 hours. Thyroid Function Tests: No results for input(s): TSH, T4TOTAL, FREET4, T3FREE, THYROIDAB in the last 72 hours. Anemia Panel: No results for input(s): VITAMINB12, FOLATE, FERRITIN, TIBC, IRON, RETICCTPCT in the last 72 hours. Sepsis Labs: Recent Labs  Lab 06/07/19 1149 06/07/19 1309  LATICACIDVEN 1.2 1.2    Recent Results (from the past 240 hour(s))  Respiratory Panel by RT PCR (Flu A&B, Covid) - Nasopharyngeal Swab     Status: None    Collection Time: 06/07/19  1:03 PM   Specimen: Nasopharyngeal Swab  Result Value Ref Range Status   SARS Coronavirus 2 by RT PCR NEGATIVE NEGATIVE Final    Comment: (NOTE) SARS-CoV-2 target nucleic acids are NOT DETECTED. The SARS-CoV-2 RNA is generally detectable in upper respiratoy specimens during the acute phase of infection. The lowest concentration of SARS-CoV-2 viral copies this assay can detect is 131 copies/mL. A negative result does not preclude SARS-Cov-2 infection and should not be used as the sole basis for treatment or other patient management decisions. A negative result may occur with  improper specimen collection/handling, submission of specimen other than nasopharyngeal swab, presence of viral mutation(s) within the areas targeted by this assay, and inadequate number of viral copies (<131 copies/mL). A negative result must be combined with clinical observations, patient history, and epidemiological information. The expected result is Negative. Fact Sheet for Patients:  PinkCheek.be Fact Sheet  for Healthcare Providers:  GravelBags.it This test is not yet ap proved or cleared by the [place] and  has been authorized for detection and/or diagnosis of SARS-CoV-2 by FDA under an Emergency Use Authorization (EUA). This EUA will remain  in effect (meaning this test can be used) for the duration of the COVID-19 declaration under Section 564(b)(1) of the Act, 21 U.S.C. section 360bbb-3(b)(1), unless the authorization is terminated or revoked sooner.    Influenza A by PCR NEGATIVE NEGATIVE Final   Influenza B by PCR NEGATIVE NEGATIVE Final    Comment: (NOTE) The Xpert Xpress SARS-CoV-2/FLU/RSV assay is intended as an aid in  the diagnosis of influenza from Nasopharyngeal swab specimens and  should not be used as a sole basis for treatment. Nasal washings and  aspirates are unacceptable for Xpert Xpress  SARS-CoV-2/FLU/RSV  testing. Fact Sheet for Patients: PinkCheek.be Fact Sheet for Healthcare Providers: GravelBags.it This test is not yet approved or cleared by the [place] FDA and  has been authorized for detection and/or diagnosis of SARS-CoV-2 by  FDA under an Emergency Use Authorization (EUA). This EUA will remain  in effect (meaning this test can be used) for the duration of the  Covid-19 declaration under Section 564(b)(1) of the Act, 21  U.S.C. section 360bbb-3(b)(1), unless the authorization is  terminated or revoked. Performed at [facility], [place] [address], [place], [place] [address]   Culture, blood (routine x 2)     Status: None (Preliminary result)   Collection Time: 06/07/19  6:48 PM   Specimen: BLOOD  Result Value Ref Range Status   Specimen Description BLOOD RIGHT ANTECUBITAL  Final   Special Requests   Final    BOTTLES DRAWN AEROBIC AND ANAEROBIC Blood Culture adequate volume   Culture   Final    NO GROWTH 2 DAYS Performed at [facility], [place] [address] [place], [place] [address]    Report Status PENDING  Incomplete  Culture, blood (routine x 2)     Status: None (Preliminary result)   Collection Time: 06/07/19 11:04 PM   Specimen: BLOOD  Result Value Ref Range Status   Specimen Description BLOOD RIGHT ANTECUBITAL  Final   Special Requests   Final    BOTTLES DRAWN AEROBIC AND ANAEROBIC Blood Culture adequate volume   Culture   Final    NO GROWTH 1 DAY Performed at [facility], [place] [address], [place], [place] [address]    Report Status PENDING  Incomplete  Surgical pcr screen     Status: None   Collection Time: 06/09/19  6:43 AM   Specimen: Nasal Mucosa; Nasal Swab  Result Value Ref Range Status   MRSA, PCR NEGATIVE NEGATIVE Final   Staphylococcus aureus NEGATIVE NEGATIVE Final    Comment: (NOTE) The Xpert SA Assay (FDA approved for NASAL specimens in patients 70 years  of age and older), is one component of a comprehensive surveillance program. It is not intended to diagnose infection nor to guide or monitor treatment. Performed at [facility], [place] [address] [place], [place] [address]          Radiology Studies: EEG  Result Date: 06/08/2019 Lora Havens, MD     06/08/2019  8:19 PM Patient Name: Bianca Myers MRN: [id number] Epilepsy Attending: Lora Havens Referring Physician/Provider: Dr Aline August Date: 06/08/2019 Duration: 24.58 mins Patient history: 73yo F with ams and twitching movements of bilateral upper extremities EEG to evaluate for seizure.  Level of alertness: awake AEDs during EEG study: None Technical aspects: This EEG study was done with scalp electrodes positioned according to the 10-20 International system of electrode placement. Electrical activity was acquired at a sampling rate of '500Hz'$  and reviewed with a high frequency filter of '70Hz'$  and a low frequency filter of '1Hz'$ . EEG data were recorded continuously and digitally stored. DESCRIPTION: No clear posterior dominant rhythm was seen. EEG showed continuous generalized polymorphic 3-'6hz'$  theta-delta slowing. Photic driving was not seen during photic stimulation. Hyperventilation was not performed. ABNORMALITY - Continuous slow, generalized IMPRESSION: This study is suggestive of moderate diffuse encephalopathy, non specific to etiology. No seizures or epileptiform discharges were seen throughout the recording. Priyanka Barbra Sarks   CT ABDOMEN PELVIS WO CONTRAST  Result Date: 06/07/2019 CLINICAL DATA:  Abdominal trauma syncope, fall EXAM: CT CHEST WITHOUT CONTRAST TECHNIQUE: Multidetector CT imaging of the chest was performed following the standard protocol without IV contrast. COMPARISON:  October 28, 2013 FINDINGS: Cardiovascular: There is mild cardiomegaly. No significant pericardial fluid/thickening. Great vessels are normal in course and caliber. Limited evaluation of the  vascular structures due to the lack of intravenous contrast. Scattered aortic atherosclerosis. Coronary artery calcifications are present. Mediastinum/Nodes: No pneumomediastinum. No large mediastinal hematoma. The trachea and esophagus are grossly unremarkable. Lungs/Pleura:Scattered areas of bronchiectasis with architectural distortion are seen predominantly within both lung apices and at the posterior lower lungs. Centrilobular emphysematous changes seen at both lung apices. No pneumothorax. No pleural effusion. Musculoskeletal: Nondisplaced fractures are seen through the anterior right fourth and fifth ribs. Abdomen/pelvis: Hepatobiliary: Homogeneous hepatic attenuation without traumatic injury. No focal lesion. Gallbladder physiologically distended, no calcified stone. No biliary dilatation. Pancreas: No evidence for traumatic injury. Portions are partially obscured by adjacent bowel loops and paucity of intra-abdominal fat. No ductal dilatation or inflammation. Spleen: Again noted is a 5 cm low-density lesion in the posterior spleen. Normal in size. Adrenals/Urinary Tract: No adrenal hemorrhage. Bilateral renal atrophy is noted. There is a partially visualized low-density lesions seen within the lower pole the left kidney measuring 1.2 cm. Bladder is physiologically distended without wall thickening. Stomach/Bowel: Suboptimally assessed without enteric contrast, allowing for this, no evidence of bowel injury. Stomach physiologically distended. There are no dilated or thickened small or large bowel loops. Moderate stool burden. Scattered colonic diverticula are noted. No evidence of mesenteric hematoma. No free air free fluid. Vascular/Lymphatic: No acute vascular injury. The abdominal aorta and IVC are intact. No evidence of retroperitoneal, abdominal, or pelvic adenopathy. Scattered aortic atherosclerosis is noted. Reproductive: No acute abnormality. Partially calcified posterior uterine fibroid is seen.  Other: No focal contusion or abnormality of the abdominal wall. A small fat containing anterior umbilical hernia is noted. Musculoskeletal: There is a comminuted mildly displaced left transoral femoral neck fracture. The femoral head is still well seated within the acetabulum. The patient is status post ORIF with plate and screw fixation of the posterior right acetabulum. There is a grade 1 anterolisthesis of L5 on S1. There is progression of endplate sclerosis and reactive changes with erosions at L5-S1. IMPRESSION: 1. No acute intrathoracic, abdominal, or pelvic injury, h wall a owever somewhat limited due to lack of intravenous contrast. 2. Nondisplaced fractures through the right anterior fourth and fifth ribs. 3. Areas of chronic interstitial lung changes and scarring seen throughout both lungs with centrilobular emphysematous changes at the lung apices. 4. Diverticulosis without diverticulitis. 5.  Aortic Atherosclerosis (ICD10-I70.0). 6. Comminuted mildly displaced left transcervical femoral neck fracture. 7. Grade 1 anterolisthesis of  L5 on S1 with advanced degenerative changes as on prior radiograph dating back to 2019. Electronically Signed   By: Prudencio Pair M.D.   On: 06/07/2019 13:16   DG Chest 1 View  Result Date: 06/07/2019 CLINICAL DATA:  Syncopal episode. EXAM: CHEST  1 VIEW COMPARISON:  04/08/2019 chest radiograph and prior. FINDINGS: Overlying telemetry wires obscure visualization of the chest. Bilateral upper lung scarring and right predominant volume loss, unchanged. Patchy and streaky bibasilar opacities likely reflect atelectasis. No pneumothorax or pleural effusion. Cardiomediastinal silhouette is grossly unchanged. Partially imaged left upper extremity vascular stent. Osteopenia and multilevel spondylosis. Aortic atherosclerotic calcifications. IMPRESSION: No active airspace disease. Biapical scarring with right upper lung volume loss, grossly unchanged. Electronically Signed   By:  Primitivo Gauze M.D.   On: 06/07/2019 12:44   DG Pelvis 1-2 Views  Result Date: 06/07/2019 CLINICAL DATA:  Syncope, fall, left hip pain EXAM: PELVIS - 1-2 VIEW; LEFT FEMUR 2 VIEWS COMPARISON:  None. FINDINGS: Osteopenia. There is a displaced transcervical fracture of the left femoral neck. No fracture or dislocation of the distal left femur. Plate and screw fixation of the right acetabulum. No acute displaced fracture or dislocation of the pelvis or proximal right femur in frontal view only. IMPRESSION: 1. There is a displaced transcervical fracture of the left femoral neck. 2.  No fracture or dislocation of the distal left femur. 3. Osteopenia, which limits sensitivity for additional fractures of the pelvis. Electronically Signed   By: Eddie Candle M.D.   On: 06/07/2019 12:32   CT Head Wo Contrast  Result Date: 06/07/2019 CLINICAL DATA:  Fall at home.  Head injury EXAM: CT HEAD WITHOUT CONTRAST CT CERVICAL SPINE WITHOUT CONTRAST TECHNIQUE: Multidetector CT imaging of the head and cervical spine was performed following the standard protocol without intravenous contrast. Multiplanar CT image reconstructions of the cervical spine were also generated. COMPARISON:  None. FINDINGS: CT HEAD FINDINGS Brain: Mild atrophy without hydrocephalus. Patchy white matter hypodensity diffusely compatible with chronic microvascular ischemia. No acute hemorrhage, mass, or infarction. Small interhemispheric lipomas are present along the falx. This is felt to be incidental. Corpus callosum normally formed. Vascular: Negative for hyperdense vessel Skull: Negative Sinuses/Orbits: Paranasal sinuses clear. Bilateral cataract extraction. Other: None CT CERVICAL SPINE FINDINGS Alignment: Normal alignment with straightening of the cervical lordosis Skull base and vertebrae: Negative for fracture Soft tissues and spinal canal: No soft tissue mass or edema. Disc levels: Disc degeneration and spurring C4-5 and C5-6. Moderate right  foraminal stenosis at C5-6 due to spurring. Mild disc degeneration and spurring on the left at C6-7. Upper chest: CT chest reported separately. Other: None IMPRESSION: 1. No acute intracranial abnormality 2. Negative for cervical spine fracture. Mild to moderate cervical spondylosis. Electronically Signed   By: Franchot Gallo M.D.   On: 06/07/2019 12:57   CT Chest Wo Contrast  Result Date: 06/07/2019 CLINICAL DATA:  Abdominal trauma syncope, fall EXAM: CT CHEST WITHOUT CONTRAST TECHNIQUE: Multidetector CT imaging of the chest was performed following the standard protocol without IV contrast. COMPARISON:  October 28, 2013 FINDINGS: Cardiovascular: There is mild cardiomegaly. No significant pericardial fluid/thickening. Great vessels are normal in course and caliber. Limited evaluation of the vascular structures due to the lack of intravenous contrast. Scattered aortic atherosclerosis. Coronary artery calcifications are present. Mediastinum/Nodes: No pneumomediastinum. No large mediastinal hematoma. The trachea and esophagus are grossly unremarkable. Lungs/Pleura:Scattered areas of bronchiectasis with architectural distortion are seen predominantly within both lung apices and at the posterior lower lungs. Centrilobular  emphysematous changes seen at both lung apices. No pneumothorax. No pleural effusion. Musculoskeletal: Nondisplaced fractures are seen through the anterior right fourth and fifth ribs. Abdomen/pelvis: Hepatobiliary: Homogeneous hepatic attenuation without traumatic injury. No focal lesion. Gallbladder physiologically distended, no calcified stone. No biliary dilatation. Pancreas: No evidence for traumatic injury. Portions are partially obscured by adjacent bowel loops and paucity of intra-abdominal fat. No ductal dilatation or inflammation. Spleen: Again noted is a 5 cm low-density lesion in the posterior spleen. Normal in size. Adrenals/Urinary Tract: No adrenal hemorrhage. Bilateral renal atrophy is  noted. There is a partially visualized low-density lesions seen within the lower pole the left kidney measuring 1.2 cm. Bladder is physiologically distended without wall thickening. Stomach/Bowel: Suboptimally assessed without enteric contrast, allowing for this, no evidence of bowel injury. Stomach physiologically distended. There are no dilated or thickened small or large bowel loops. Moderate stool burden. Scattered colonic diverticula are noted. No evidence of mesenteric hematoma. No free air free fluid. Vascular/Lymphatic: No acute vascular injury. The abdominal aorta and IVC are intact. No evidence of retroperitoneal, abdominal, or pelvic adenopathy. Scattered aortic atherosclerosis is noted. Reproductive: No acute abnormality. Partially calcified posterior uterine fibroid is seen. Other: No focal contusion or abnormality of the abdominal wall. A small fat containing anterior umbilical hernia is noted. Musculoskeletal: There is a comminuted mildly displaced left transoral femoral neck fracture. The femoral head is still well seated within the acetabulum. The patient is status post ORIF with plate and screw fixation of the posterior right acetabulum. There is a grade 1 anterolisthesis of L5 on S1. There is progression of endplate sclerosis and reactive changes with erosions at L5-S1. IMPRESSION: 1. No acute intrathoracic, abdominal, or pelvic injury, h wall a owever somewhat limited due to lack of intravenous contrast. 2. Nondisplaced fractures through the right anterior fourth and fifth ribs. 3. Areas of chronic interstitial lung changes and scarring seen throughout both lungs with centrilobular emphysematous changes at the lung apices. 4. Diverticulosis without diverticulitis. 5.  Aortic Atherosclerosis (ICD10-I70.0). 6. Comminuted mildly displaced left transcervical femoral neck fracture. 7. Grade 1 anterolisthesis of L5 on S1 with advanced degenerative changes as on prior radiograph dating back to 2019.  Electronically Signed   By: Prudencio Pair M.D.   On: 06/07/2019 13:16   CT Cervical Spine Wo Contrast  Result Date: 06/07/2019 CLINICAL DATA:  Fall at home.  Head injury EXAM: CT HEAD WITHOUT CONTRAST CT CERVICAL SPINE WITHOUT CONTRAST TECHNIQUE: Multidetector CT imaging of the head and cervical spine was performed following the standard protocol without intravenous contrast. Multiplanar CT image reconstructions of the cervical spine were also generated. COMPARISON:  None. FINDINGS: CT HEAD FINDINGS Brain: Mild atrophy without hydrocephalus. Patchy white matter hypodensity diffusely compatible with chronic microvascular ischemia. No acute hemorrhage, mass, or infarction. Small interhemispheric lipomas are present along the falx. This is felt to be incidental. Corpus callosum normally formed. Vascular: Negative for hyperdense vessel Skull: Negative Sinuses/Orbits: Paranasal sinuses clear. Bilateral cataract extraction. Other: None CT CERVICAL SPINE FINDINGS Alignment: Normal alignment with straightening of the cervical lordosis Skull base and vertebrae: Negative for fracture Soft tissues and spinal canal: No soft tissue mass or edema. Disc levels: Disc degeneration and spurring C4-5 and C5-6. Moderate right foraminal stenosis at C5-6 due to spurring. Mild disc degeneration and spurring on the left at C6-7. Upper chest: CT chest reported separately. Other: None IMPRESSION: 1. No acute intracranial abnormality 2. Negative for cervical spine fracture. Mild to moderate cervical spondylosis. Electronically Signed   By:  Franchot Gallo M.D.   On: 06/07/2019 12:57   MR BRAIN WO CONTRAST  Result Date: 06/08/2019 CLINICAL DATA:  Follow-up examination for acute confusion, metabolic encephalopathy, evaluate for possible stroke. EXAM: MRI HEAD WITHOUT CONTRAST TECHNIQUE: Multiplanar, multiecho pulse sequences of the brain and surrounding structures were obtained without intravenous contrast. COMPARISON:  Prior CT from  06/07/2019 as well as recent brain MRI from 02/28/2019. FINDINGS: Brain: Examination moderately degraded by motion artifact. Generalized age-related cerebral atrophy. Patchy T2/FLAIR hyperintensity within the periventricular and deep white matter both cerebral hemispheres most consistent with chronic small vessel ischemic disease, moderate in nature. Patchy involvement of the pons present. 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 No abnormal foci of restricted diffusion to suggest acute or subacute ischemia. Gray-white matter differentiation maintained. No encephalomalacia to suggest chronic cortical infarction. No foci of susceptibility artifact to suggest acute or chronic intracranial hemorrhage. No mass lesion, midline shift or mass effect. Ventricles normal size without hydrocephalus. No extra-axial fluid collection. No made of an empty sella. Midline structures intact. Vascular: Major intracranial vascular flow voids are maintained. Skull and upper cervical spine: Craniocervical junction within normal limits. Bone marrow signal intensity normal. No scalp soft tissue abnormality. Sinuses/Orbits: Patient status post bilateral ocular lens replacement. Globes and orbital soft tissues demonstrate no acute finding. Paranasal sinuses are clear. Trace left mastoid effusion noted, of doubtful significance. Inner ear structures grossly normal. Other: None. IMPRESSION: 1. No acute intracranial infarct or other abnormality. 2. Age-related cerebral atrophy with moderate chronic small vessel ischemic disease, stable from previous. Electronically Signed   By: Jeannine Boga M.D.   On: 06/08/2019 23:56   IR Fluoro Guide CV Line Right  Result Date: 06/08/2019 INDICATION: 74 year old female referred for hemodialysis catheter placement EXAM: IMAGE GUIDED PLACEMENT OF TEMPORARY HEMODIALYSIS CATHETER MEDICATIONS: NONE ANESTHESIA/SEDATION: NONE FLUOROSCOPY TIME:  Fluoroscopy  Time: 0 minutes 6 seconds ( mGy). COMPLICATIONS: None PROCEDURE: Informed written consent was obtained from the patient's family after a discussion of the risks, benefits, and alternatives to treatment. Questions regarding the procedure were encouraged and answered. The right neck was prepped with chlorhexidine in a sterile fashion, and a sterile drape was applied covering the operative field. Maximum barrier sterile technique with sterile gowns and gloves were used for the procedure. A timeout was performed prior to the initiation of the procedure. A micropuncture kit was utilized to access the right internal jugular vein under direct, real-time ultrasound guidance after the overlying soft tissues were anesthetized with 1% lidocaine with epinephrine. Ultrasound image documentation was performed. The microwire was kinked to measure appropriate catheter length. A stiff glidewire was advanced to the level of the IVC. A 19 cm hemodialysis catheter was then placed over the wire. Final catheter positioning was confirmed and documented with a spot radiographic image. The catheter aspirates and flushes normally. The catheter was flushed with appropriate volume heparin dwells. Dressings were applied. The patient tolerated the procedure well without immediate post procedural complication. IMPRESSION: Status post right IJ temporary hemodialysis catheter placement Signed, Dulcy Fanny. Dellia Nims, RPVI [facility] Electronically Signed   By: Corrie Mckusick D.O.   On: 06/08/2019 11:49   IR US Guide Vasc Access Right  Result Date: 06/08/2019 INDICATION: 74 year old female referred for hemodialysis catheter placement EXAM: IMAGE GUIDED PLACEMENT OF TEMPORARY HEMODIALYSIS CATHETER MEDICATIONS: NONE ANESTHESIA/SEDATION: NONE FLUOROSCOPY TIME:  Fluoroscopy Time: 0 minutes 6 seconds ( mGy). COMPLICATIONS: None PROCEDURE: Informed written consent was obtained from  the patient's  family after a discussion of the risks, benefits, and alternatives to treatment. Questions regarding the procedure were encouraged and answered. The right neck was prepped with chlorhexidine in a sterile fashion, and a sterile drape was applied covering the operative field. Maximum barrier sterile technique with sterile gowns and gloves were used for the procedure. A timeout was performed prior to the initiation of the procedure. A micropuncture kit was utilized to access the right internal jugular vein under direct, real-time ultrasound guidance after the overlying soft tissues were anesthetized with 1% lidocaine with epinephrine. Ultrasound image documentation was performed. The microwire was kinked to measure appropriate catheter length. A stiff glidewire was advanced to the level of the IVC. A 19 cm hemodialysis catheter was then placed over the wire. Final catheter positioning was confirmed and documented with a spot radiographic image. The catheter aspirates and flushes normally. The catheter was flushed with appropriate volume heparin dwells. Dressings were applied. The patient tolerated the procedure well without immediate post procedural complication. IMPRESSION: Status post right IJ temporary hemodialysis catheter placement Signed, Dulcy Fanny. Dellia Nims, RPVI [facility] Electronically Signed   By: Corrie Mckusick D.O.   On: 06/08/2019 11:49   DG FEMUR MIN 2 VIEWS LEFT  Result Date: 06/07/2019 CLINICAL DATA:  Syncope, fall, left hip pain EXAM: PELVIS - 1-2 VIEW; LEFT FEMUR 2 VIEWS COMPARISON:  None. FINDINGS: Osteopenia. There is a displaced transcervical fracture of the left femoral neck. No fracture or dislocation of the distal left femur. Plate and screw fixation of the right acetabulum. No acute displaced fracture or dislocation of the pelvis or proximal right femur in frontal view only. IMPRESSION: 1. There is a displaced transcervical fracture  of the left femoral neck. 2.  No fracture or dislocation of the distal left femur. 3. Osteopenia, which limits sensitivity for additional fractures of the pelvis. Electronically Signed   By: Eddie Candle M.D.   On: 06/07/2019 12:32        Scheduled Meds: . albuterol  2.5 mg Nebulization Once  . Chlorhexidine Gluconate Cloth  6 each Topical Q0600  . Chlorhexidine Gluconate Cloth  6 each Topical Q0600  . heparin  2,000 Units Dialysis Once in dialysis  . heparin injection (subcutaneous)  5,000 Units Subcutaneous Q8H  . sodium zirconium cyclosilicate  10 g Oral Once   Continuous Infusions:        Barb Merino, MD [facility] 06/09/2019, 10:12 AM

## 2019-06-09 NOTE — Progress Notes (Signed)
Patient and son have expressed the wish for patient to not receive and for of blood products during surgery or her stay here, she is Jehovah's witness and asks kindly to have this request followed

## 2019-06-09 NOTE — Plan of Care (Signed)
°  Problem: Coping: °Goal: Level of anxiety will decrease °Outcome: Progressing °  °

## 2019-06-09 NOTE — Progress Notes (Signed)
NEUROLOGY PROGRESS NOTE   Subjective: Patient has no recollection of yesterday.  She states that she is sore and weak throughout  Exam: Vitals:   06/09/19 0500 06/09/19 0955  BP: (!) 148/80 (!) 145/77  Pulse: 84 100  Resp: 18 19  Temp: 98 F (36.7 C)   SpO2: 99% 98%    Neuro:  Mental Status: Alert, oriented, thought content appropriate.  Speech fluent without evidence of aphasia.  Able to follow  commands without difficulty. Cranial Nerves: II:  Visual fields grossly normal,  III,IV, VI: ptosis not present, extra-ocular motions intact bilaterally pupils equal, round, reactive to light and accommodation V,VII: smile symmetric, facial light touch sensation normal bilaterally VIII: hearing normal bilaterally IX,X: Palate rises midline XI: bilateral shoulder shrug XII: midline tongue extension Motor: 4/5 in the upper extremities, 3/5 in lower extremities.  She is weaker on her left secondary to fracture Sensory: Pinprick and light touch intact throughout, bilaterally Deep Tendon Reflexes: 2+ and symmetric throughout upper extremities with 1+ in knee jerk  Plantars: Right: downgoing   Left: downgoing Cerebellar: normal finger-to-nose,     Medications:  Scheduled: . albuterol  2.5 mg Nebulization Once  . Chlorhexidine Gluconate Cloth  6 each Topical Q0600  . Chlorhexidine Gluconate Cloth  6 each Topical Q0600  . heparin  2,000 Units Dialysis Once in dialysis  . heparin injection (subcutaneous)  5,000 Units Subcutaneous Q8H  . sodium zirconium cyclosilicate  10 g Oral Once    Pertinent Labs/Diagnostics: -Chloride 92 -CO2 21 -BUN 58 -Creatinine 9.07  EEG  Result Date: 06/08/2019  IMPRESSION: This study is suggestive of moderate diffuse encephalopathy, non specific to etiology. No seizures or epileptiform discharges were seen throughout the recording. Lora Havens    MR BRAIN WO CONTRAST  Result Date: 06/08/2019 . IMPRESSION: 1. No acute intracranial infarct or  other abnormality. 2. Age-related cerebral atrophy with moderate chronic small vessel ischemic disease, stable from previous. Electronically Signed   By: Jeannine Boga M.D.   On: 06/08/2019 23:56    Etta Quill PA-C [facility] [phone]    NEUROHOSPITALIST ADDENDUM Performed a face to face diagnostic evaluation.   I have reviewed the contents of history and physical exam as documented by PA/ARNP/Resident and agree with above documentation.  I have discussed and formulated the above plan as documented. Edits to the note have been made as needed.  Assessment:  74 year old female with end-stage renal disease on hemodialysis who presented on consult with polymyoclonus and encephalopathy.   MRI brain was negative for acute infarct and EEG just showed moderate diffuse encephalopathy.  Patient is significantly improved, has occasional myoclonus.  Impression Uremic encephalopathy  Recommendations Continue to treat electrolyte and metabolic disturbances   Neurology will available as needed.   Karena Addison Valia Wingard MD [facility] [phone]   If 7pm to 7am, please call on call as listed on AMION.

## 2019-06-09 NOTE — Progress Notes (Addendum)
[facility] Progress Note  Subjective:  Seen in room. MS improved from yesterday. Alert, oriented. Completed HD yesterday am. Will have HD again today.  Some hip pain.   Vitals:   06/08/19 2259 06/09/19 0212 06/09/19 0500 06/09/19 0955  BP: (!) 149/91 (!) 144/85 (!) 148/80 (!) 145/77  Pulse: 82 81 84 100  Resp: 18 18 18 19   Temp: 98 F (36.7 C) 98 F (36.7 C) 98 F (36.7 C)   TempSrc:   Oral   SpO2: 100% 99% 99% 98%  Weight:      Height:        Exam: General: Eldely female, alert, nad  Lungs: CTA bilaterally without wheezes, rales, or rhonchi Heart: RRR with S1 S2 Abdomen: soft NT  Lower extremities: No LE edema  Neuro: Alert, oriented, moves extremities  Dialysis Access: LUE AVF +bruit ; Temp cath right neck     Dialysis:   [facility] MWF 4h 350/800 EDW 77kg 2K/2.25Ca UFP 4 AVF Heparin 2000 Mircera 200 (last 3/31)   Assessment/Plan: 1. Confusion/AMS on 4/7. Concern for seizure/uremia. High BUN/Cr on admission - EEG nonspecific. Improved. Appears at baseline today.    2. Left femoral neck fracture s/p syncopal event at home.  Per primary/ortho. Likely surgery Friday.  3. Hyperkalemia - Resolved with HD.  4. ESRD -  HD MWF. Missed HD Monday d/t difficult cannulation of AVF. Unable to cannulate fistula Tuesday.  Appreciate IR placement of temp cath 4/7.  Had HD 4/7. High BUN/ creat. HD again today 4/8. HD Sat -then resume MWF next week.  5. AVF malfunction - ongoing difficulty with cannulation. VVS consulted- will need fistulogram when stable.   6. Hypertension/volume  - Hypertensive on arrival. Now controlled. On amlodipine 5, labetalol 200 bid. 7.  Anemia  - Hgb 12.  No ESA needs.  8.  Metabolic bone disease -  Continue Auryixa binders/VDRA  9.  Nutrition - Renal diet/fluid restriction when eating.     Lynnda Child PA-C [facility] Pager [phone] 06/09/2019,11:44 AM    Recent Labs  Lab 06/08/19 0520 06/08/19 1551 06/08/19 1915  06/09/19 0336  K  --  5.4*  --  4.1  BUN  --  44*  --  58*  CREATININE  --  7.63*  --  9.07*  CALCIUM  --  8.8*  --  8.6*  HGB   < >  --  13.2 11.9*   < > = values in this interval not displayed.   Inpatient medications: . albuterol  2.5 mg Nebulization Once  . Chlorhexidine Gluconate Cloth  6 each Topical Q0600  . Chlorhexidine Gluconate Cloth  6 each Topical Q0600  . heparin  2,000 Units Dialysis Once in dialysis  . heparin injection (subcutaneous)  5,000 Units Subcutaneous Q8H  . sodium zirconium cyclosilicate  10 g Oral Once

## 2019-06-09 NOTE — Progress Notes (Signed)
  Progress Note    06/09/2019 1:55 PM   Subjective: Not feeling confused  Vitals:   06/09/19 0500 06/09/19 0955  BP: (!) 148/80 (!) 145/77  Pulse: 84 100  Resp: 18 19  Temp: 98 F (36.7 C)   SpO2: 99% 98%    Physical Exam: She is awake alert and oriented this morning Left upper arm with infiltration event strong thrill below this and hand is well-perfused  CBC    Component Value Date/Time   WBC 10.8 (H) 06/09/2019 0336   RBC 3.75 (L) 06/09/2019 0336   HGB 11.9 (L) 06/09/2019 0336   HCT 36.7 06/09/2019 0336   PLT 283 06/09/2019 0336   MCV 97.9 06/09/2019 0336   MCH 31.7 06/09/2019 0336   MCHC 32.4 06/09/2019 0336   RDW 18.5 (H) 06/09/2019 0336   LYMPHSABS 1.1 06/09/2019 0336   MONOABS 1.0 06/09/2019 0336   EOSABS 0.0 06/09/2019 0336   BASOSABS 0.0 06/09/2019 0336    BMET    Component Value Date/Time   NA 137 06/09/2019 0336   K 4.1 06/09/2019 0336   CL 92 (L) 06/09/2019 0336   CO2 21 (L) 06/09/2019 0336   GLUCOSE 143 (H) 06/09/2019 0336   BUN 58 (H) 06/09/2019 0336   CREATININE 9.07 (H) 06/09/2019 0336   CALCIUM 8.6 (L) 06/09/2019 0336   GFRNONAA 4 (L) 06/09/2019 0336   GFRAA 5 (L) 06/09/2019 0336    INR    Component Value Date/Time   INR 1.3 (H) 06/08/2019 0603     Intake/Output Summary (Last 24 hours) at 06/09/2019 1355 Last data filed at 06/09/2019 1202 Gross per 24 hour  Intake 0 ml  Output 0 ml  Net 0 ml     Assessment/plan:  74 y.o. female is here with femoral neck fracture was originally scheduled for surgery today now will be performed tomorrow.  She has a temporary catheter for dialysis.  She has had an infiltration of the left upper extremity fistula will need fistulogram prior to discharge we will schedule this when she is completed with her surgery and medically stable.  Continue to hold anticoagulation.    Vannia Pola C. Donzetta Matters, MD [facility] [place] Office: [phone] Pager: [phone]  06/09/2019 1:55 PM

## 2019-06-10 ENCOUNTER — Encounter (HOSPITAL_COMMUNITY)
Admission: EM | Disposition: A | Discharge status: Skilled Nursing Facility | Payer: Self-pay | Source: Home / Self Care | Attending: Internal Medicine

## 2019-06-10 ENCOUNTER — Inpatient Hospital Stay (HOSPITAL_COMMUNITY): Payer: Medicare HMO

## 2019-06-10 ENCOUNTER — Inpatient Hospital Stay (HOSPITAL_COMMUNITY): Payer: Medicare HMO | Admitting: Anesthesiology

## 2019-06-10 ENCOUNTER — Telehealth: Payer: Self-pay | Admitting: Orthopaedic Surgery

## 2019-06-10 ENCOUNTER — Encounter (HOSPITAL_COMMUNITY): Payer: Self-pay | Admitting: Internal Medicine

## 2019-06-10 DIAGNOSIS — S72002A Fracture of unspecified part of neck of left femur, initial encounter for closed fracture: Secondary | ICD-10-CM

## 2019-06-10 DIAGNOSIS — E875 Hyperkalemia: Secondary | ICD-10-CM | POA: Diagnosis not present

## 2019-06-10 HISTORY — PX: TOTAL HIP ARTHROPLASTY: SHX124

## 2019-06-10 LAB — GLUCOSE, CAPILLARY
Glucose-Capillary: 272 mg/dL — ABNORMAL HIGH (ref 70–99)
Glucose-Capillary: 116 mg/dL — ABNORMAL HIGH (ref 70–99)

## 2019-06-10 IMAGING — RF DG C-ARM 1-60 MIN
1 series · 4 of 4 positions shown · non-contrast
Comparison: [DATE]

CLINICAL DATA: Left hip replacement

EXAM:
OPERATIVE LEFT HIP (WITH PELVIS IF PERFORMED) 4 VIEWS
TECHNIQUE: Fluoroscopic spot image(s) were submitted for interpretation
post-operatively.

[Series 1: run · 4 of 4 slices shown]
[im 1/4]
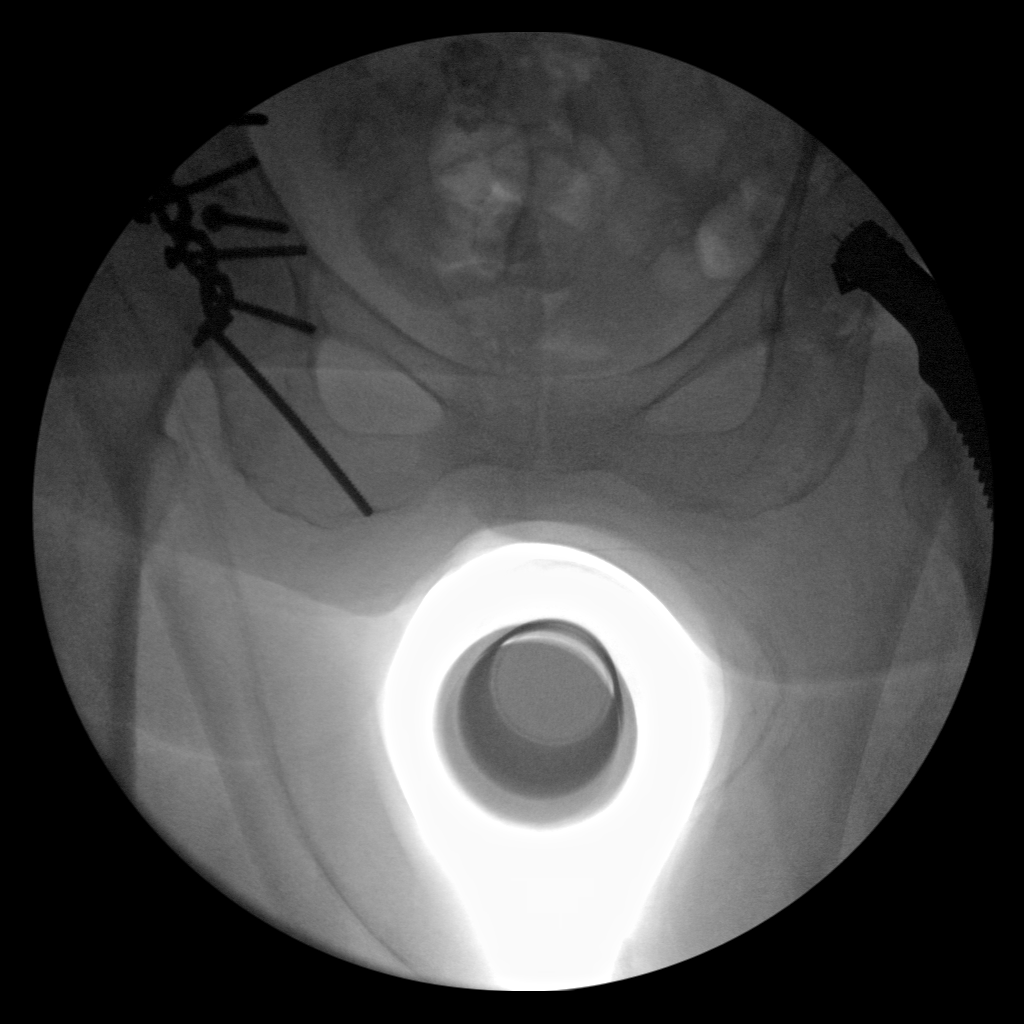
[im 2/4]
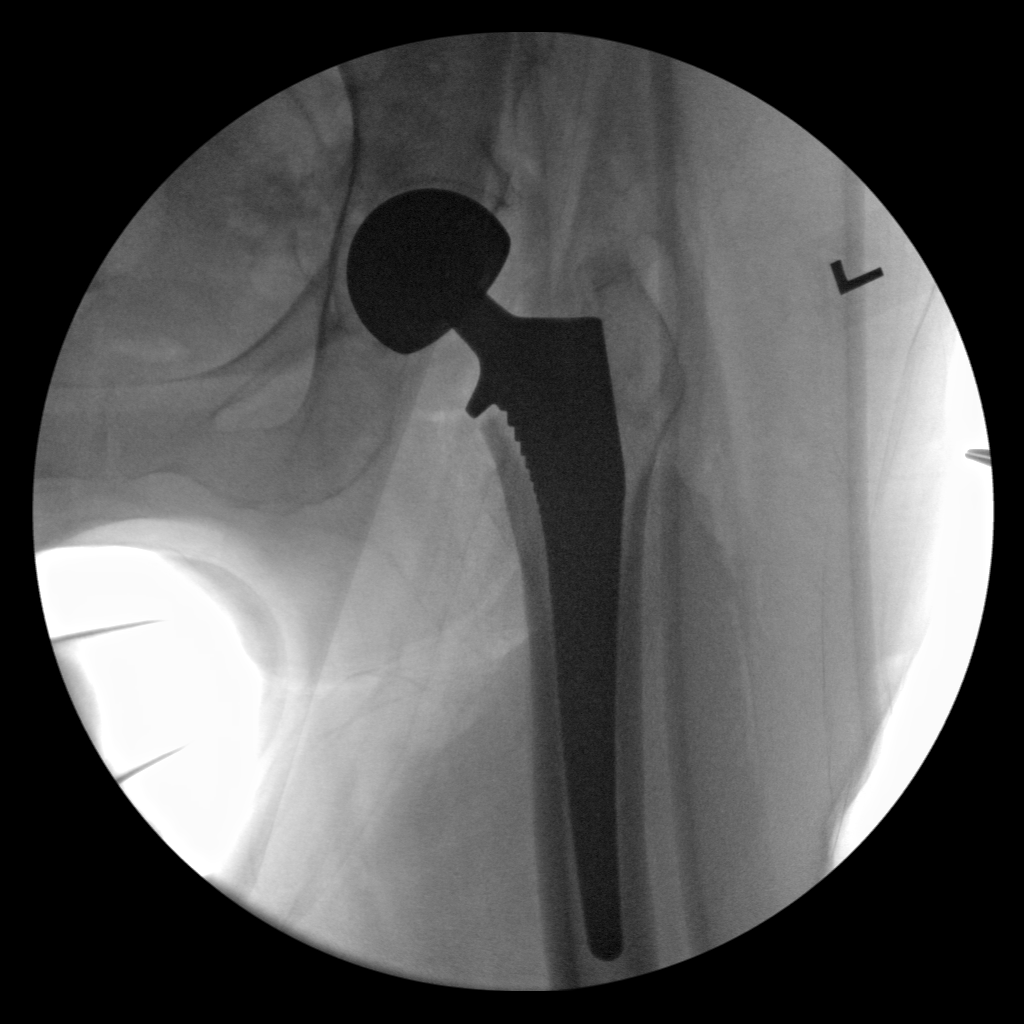
[im 3/4]
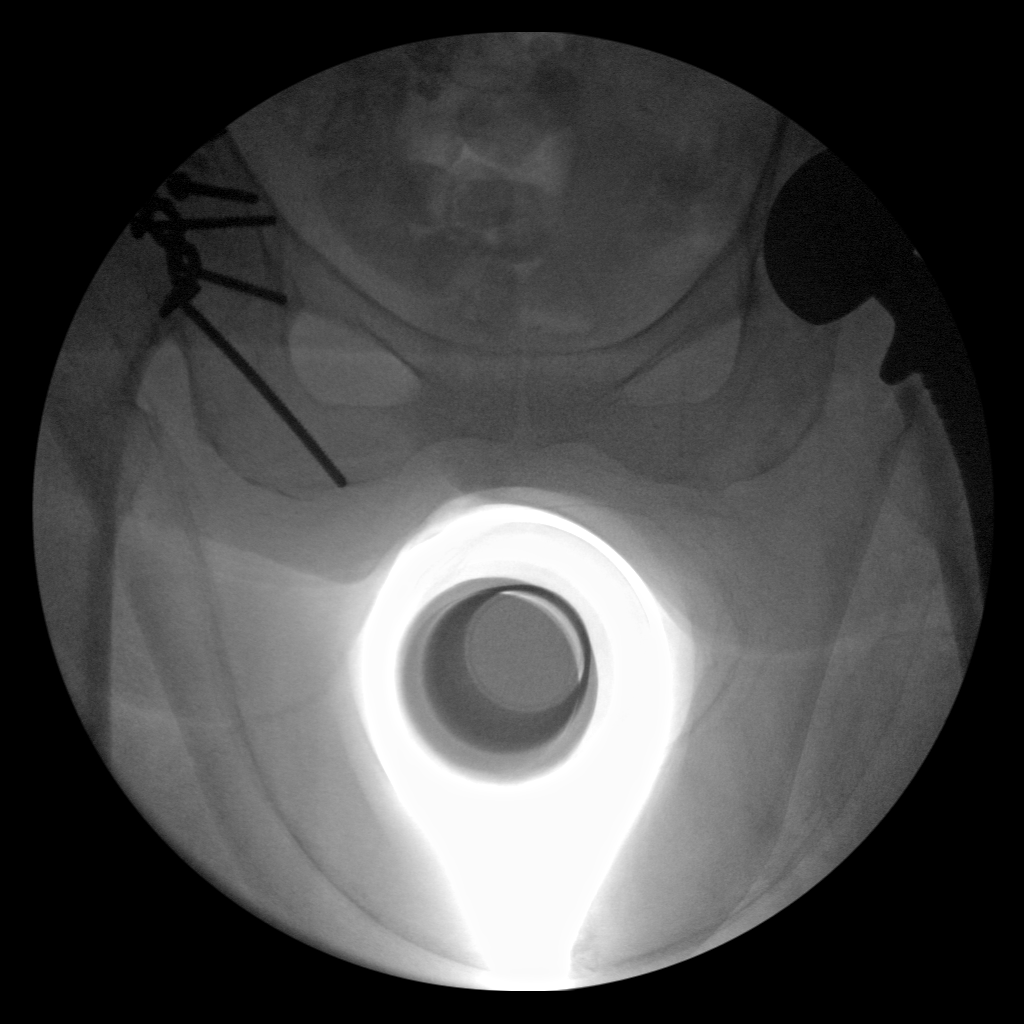
[im 4/4]
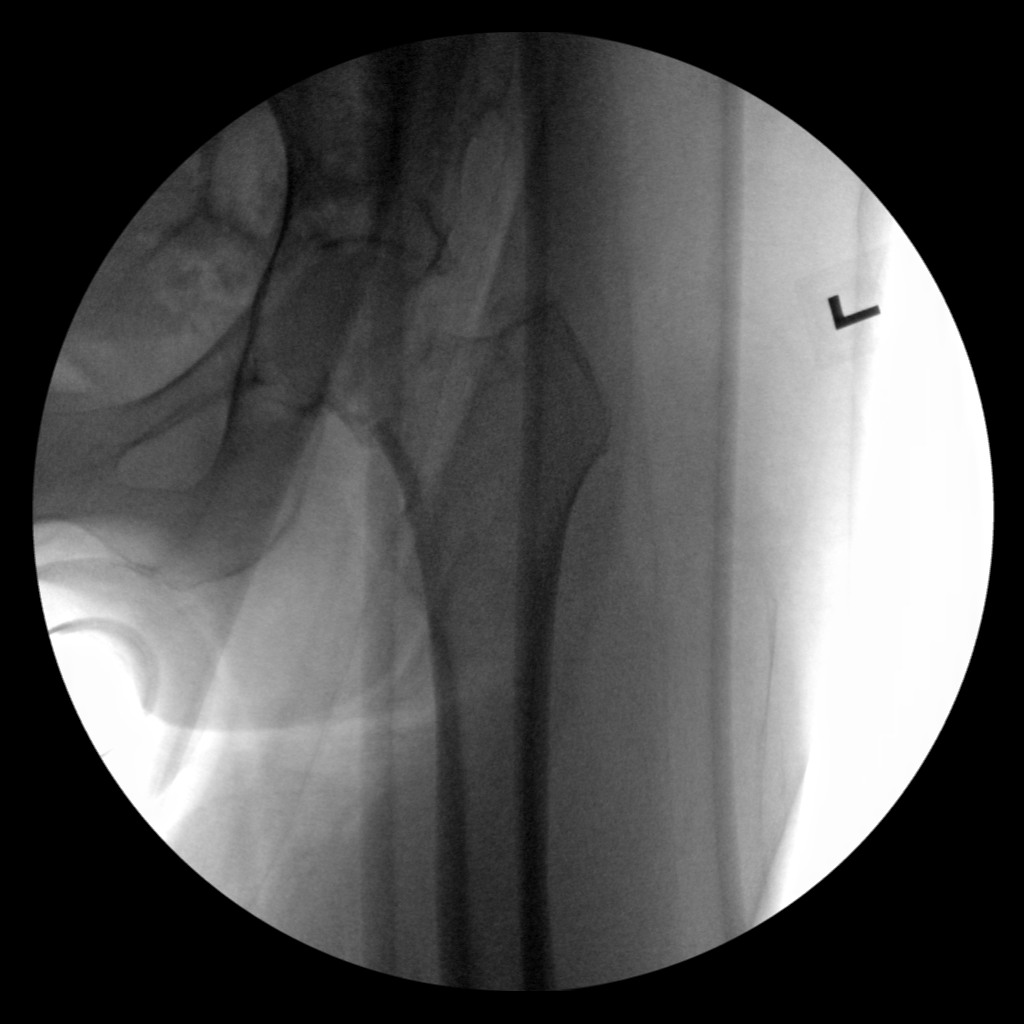

[4 of 4 positions shown; findings below may reference images not displayed]

FINDINGS: Intraoperative spot images demonstrate changes of left hip
replacement. Normal AP alignment. No hardware or bony complicating
feature.
IMPRESSION: Left hip replacement.  No visible complicating feature.

## 2019-06-10 IMAGING — RF DG HIP (WITH PELVIS) OPERATIVE*L*
1 series · 4 of 4 positions shown · non-contrast
Comparison: [DATE]

CLINICAL DATA: Left hip replacement

EXAM:
OPERATIVE LEFT HIP (WITH PELVIS IF PERFORMED) 4 VIEWS
TECHNIQUE: Fluoroscopic spot image(s) were submitted for interpretation
post-operatively.

[Series 1: run · 4 of 4 slices shown]
[im 1/4]
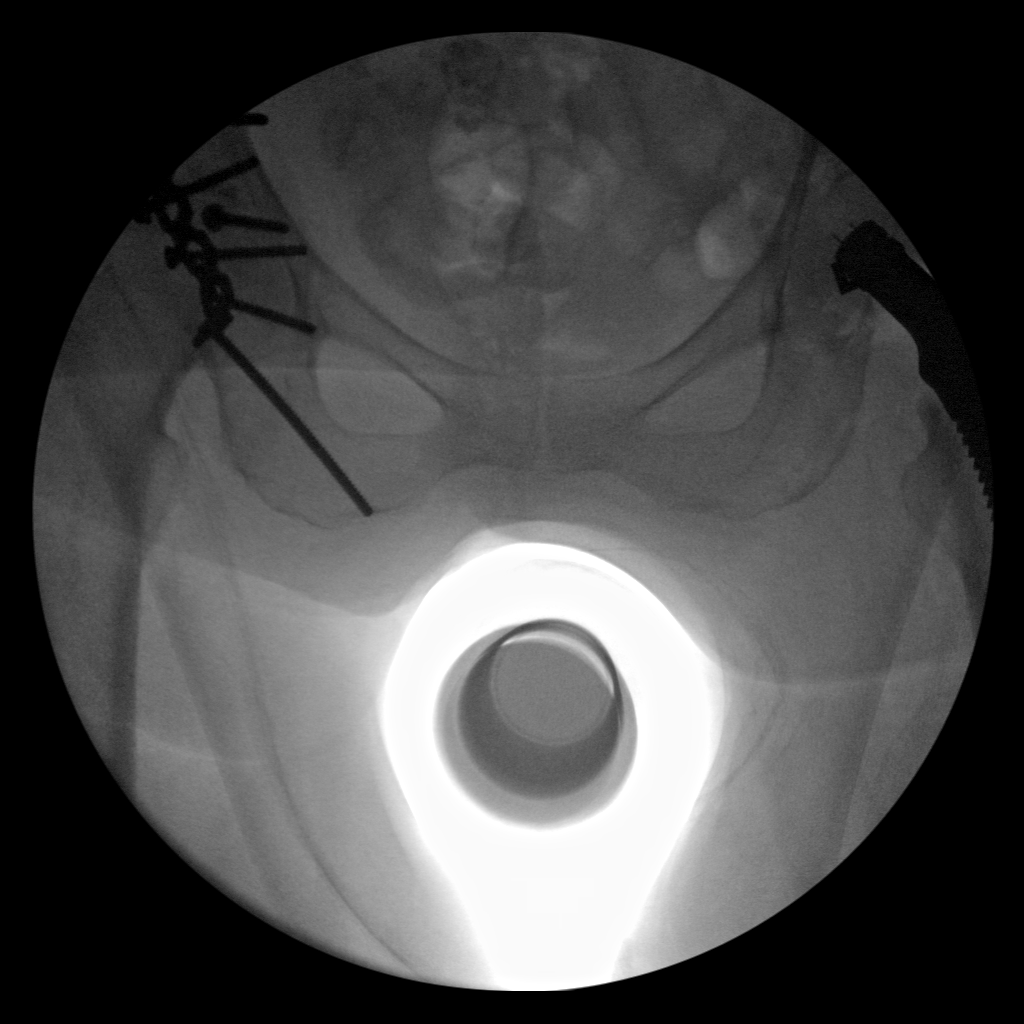
[im 2/4]
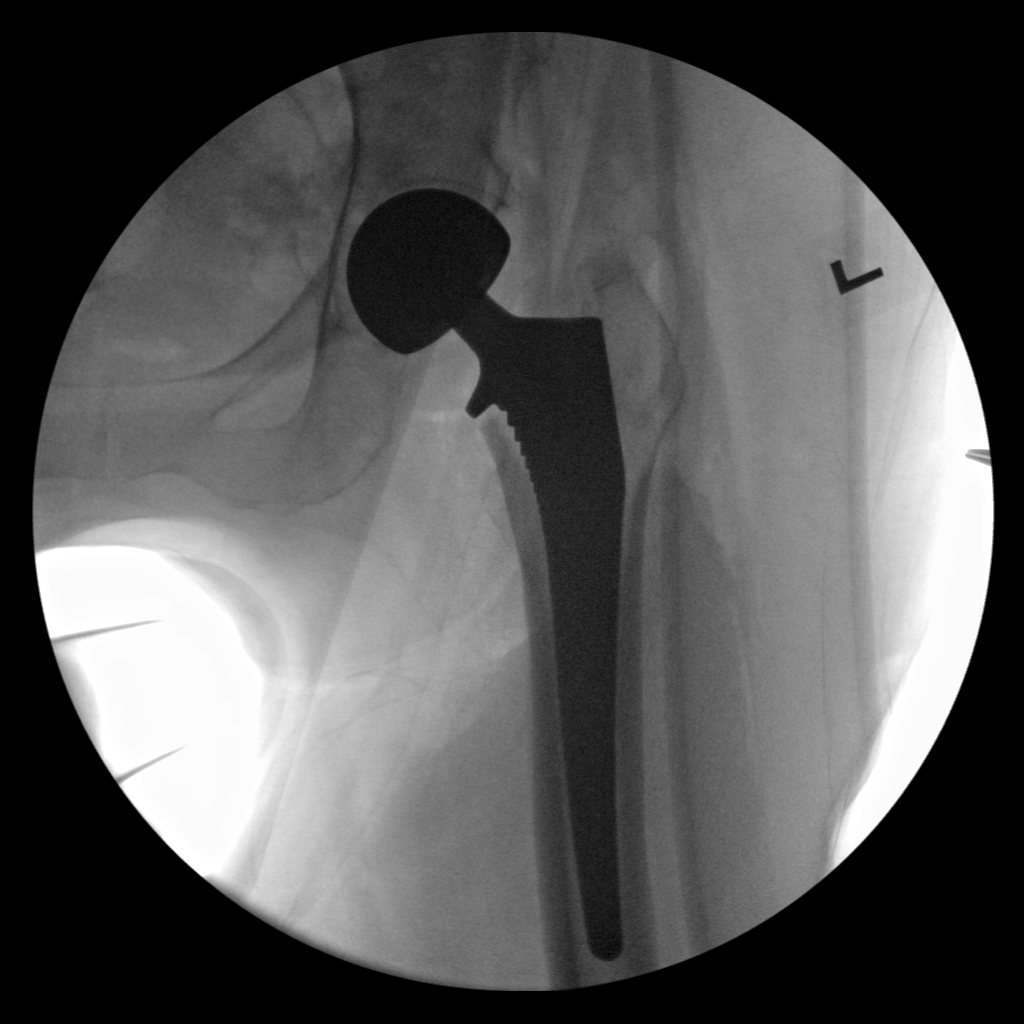
[im 3/4]
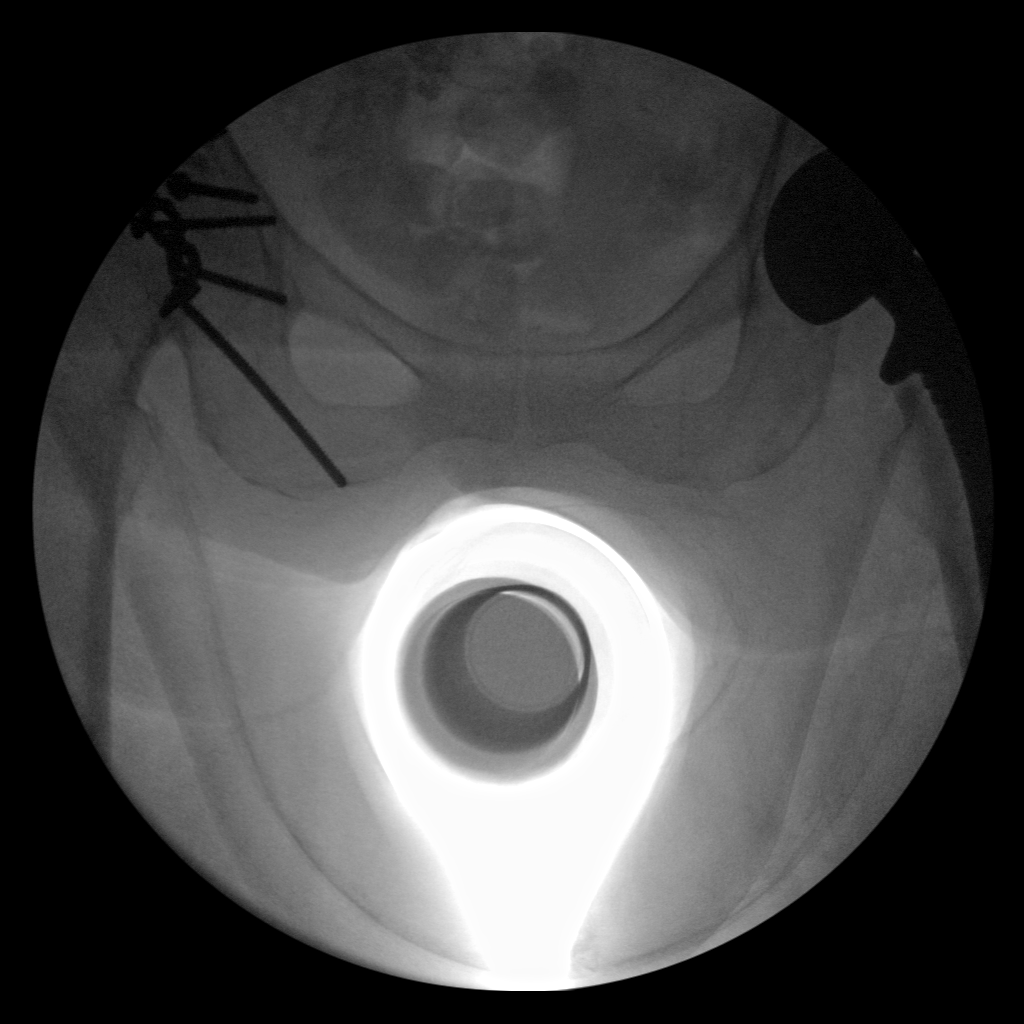
[im 4/4]
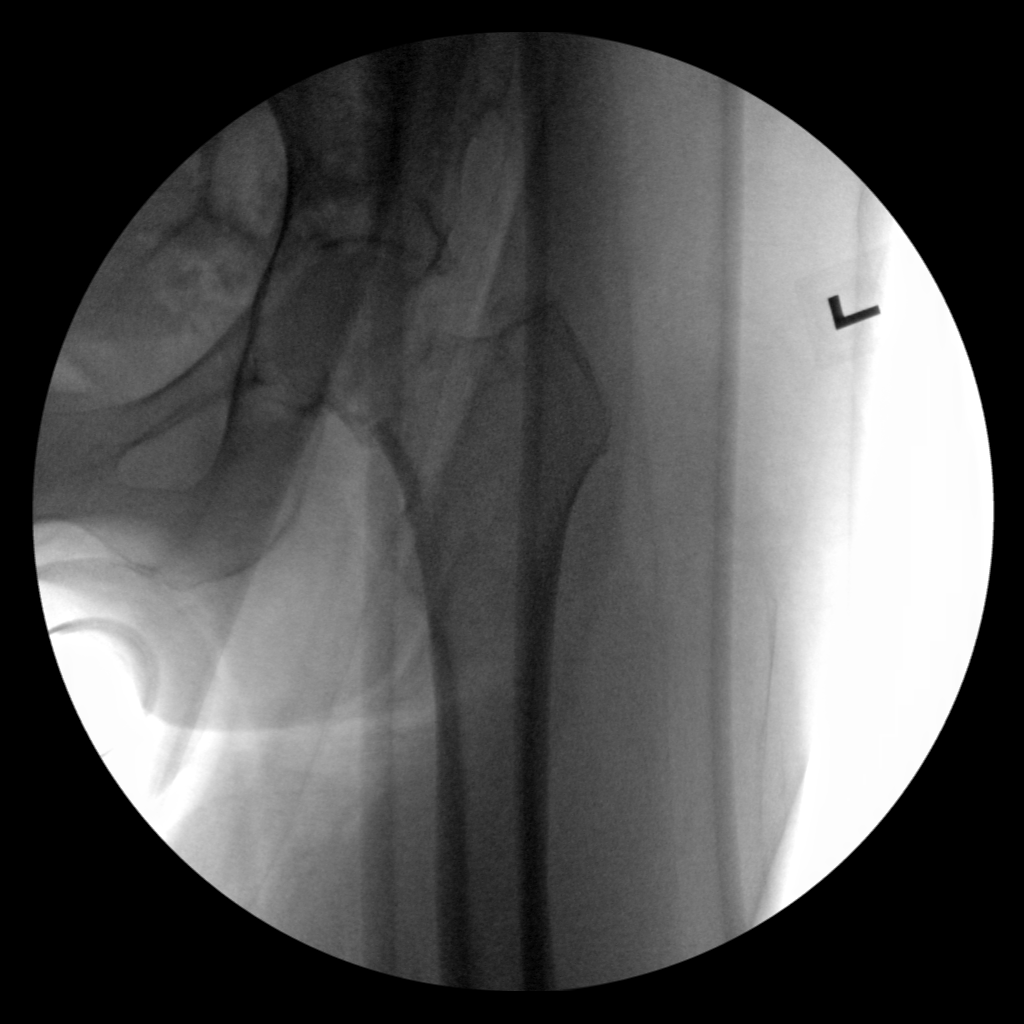

[4 of 4 positions shown; findings below may reference images not displayed]

FINDINGS: Intraoperative spot images demonstrate changes of left hip
replacement. Normal AP alignment. No hardware or bony complicating
feature.
IMPRESSION: Left hip replacement.  No visible complicating feature.

## 2019-06-10 SURGERY — ARTHROPLASTY, HIP, TOTAL, ANTERIOR APPROACH
Anesthesia: Monitor Anesthesia Care | Site: Hip | Laterality: Left

## 2019-06-10 MED ORDER — FENTANYL CITRATE (PF) 100 MCG/2ML IJ SOLN: INTRAMUSCULAR | Status: DC | PRN

## 2019-06-10 MED ORDER — CEFAZOLIN SODIUM-DEXTROSE 2-4 GM/100ML-% IV SOLN: 2.0000 g | Freq: Four times a day (QID) | INTRAVENOUS | Status: DC

## 2019-06-10 MED ORDER — MORPHINE SULFATE (PF) 2 MG/ML IV SOLN
1.0000 mg | INTRAVENOUS | Status: DC | PRN
  Administered 2019-06-10 – 2019-06-11 (×3): 1 mg via INTRAVENOUS
  Filled 2019-06-10 (×3): qty 1

## 2019-06-10 MED ORDER — ACETAMINOPHEN 500 MG PO TABS
500.0000 mg | ORAL_TABLET | Freq: Four times a day (QID) | ORAL | Status: DC
  Administered 2019-06-10 – 2019-06-11 (×2): 500 mg via ORAL
  Filled 2019-06-10 (×2): qty 1

## 2019-06-10 MED ORDER — SORBITOL 70 % SOLN: 30.0000 mL | Freq: Every day | Status: DC | PRN

## 2019-06-10 MED ORDER — SODIUM CHLORIDE 0.9% FLUSH
INTRAVENOUS | Status: DC | PRN
  Administered 2019-06-10: 20 mL via TOPICAL

## 2019-06-10 MED ORDER — PHENOL 1.4 % MT LIQD: 1.0000 | OROMUCOSAL | Status: DC | PRN

## 2019-06-10 MED ORDER — PHENYLEPHRINE HCL-NACL 10-0.9 MG/250ML-% IV SOLN
INTRAVENOUS | Status: DC | PRN
  Administered 2019-06-10: 40 ug/min via INTRAVENOUS

## 2019-06-10 MED ORDER — HEPARIN SODIUM (PORCINE) 5000 UNIT/ML IJ SOLN
5000.0000 [IU] | Freq: Three times a day (TID) | INTRAMUSCULAR | Status: DC
  Administered 2019-06-11: 5000 [IU] via SUBCUTANEOUS
  Filled 2019-06-10: qty 1

## 2019-06-10 MED ORDER — OXYCODONE-ACETAMINOPHEN 5-325 MG PO TABS: 1.0000 | ORAL_TABLET | Freq: Three times a day (TID) | ORAL | 0 refills | Status: DC | PRN

## 2019-06-10 MED ORDER — SODIUM CHLORIDE 0.9 % IV SOLN: INTRAVENOUS | Status: DC

## 2019-06-10 MED ORDER — ENOXAPARIN SODIUM 30 MG/0.3ML ~~LOC~~ SOLN: 30.0000 mg | SUBCUTANEOUS | 0 refills | Status: DC

## 2019-06-10 MED ORDER — PROPOFOL 10 MG/ML IV BOLUS
INTRAVENOUS | Status: DC | PRN
  Administered 2019-06-10: 15 mg via INTRAVENOUS
  Administered 2019-06-10: 20 mg via INTRAVENOUS

## 2019-06-10 MED ORDER — PROPOFOL 500 MG/50ML IV EMUL
INTRAVENOUS | Status: DC | PRN
  Administered 2019-06-10: 100 ug/kg/min via INTRAVENOUS

## 2019-06-10 MED ORDER — ONDANSETRON HCL 4 MG/2ML IJ SOLN
INTRAMUSCULAR | Status: DC | PRN
  Administered 2019-06-10: 4 mg via INTRAVENOUS

## 2019-06-10 MED ORDER — HYDROCODONE-ACETAMINOPHEN 5-325 MG PO TABS: 1.0000 | ORAL_TABLET | ORAL | Status: DC | PRN

## 2019-06-10 MED ORDER — TRANEXAMIC ACID-NACL 1000-0.7 MG/100ML-% IV SOLN
INTRAVENOUS | Status: AC
  Filled 2019-06-10: qty 100

## 2019-06-10 MED ORDER — MENTHOL 3 MG MT LOZG: 1.0000 | LOZENGE | OROMUCOSAL | Status: DC | PRN

## 2019-06-10 MED ORDER — CEFAZOLIN SODIUM-DEXTROSE 1-4 GM/50ML-% IV SOLN
1.0000 g | Freq: Once | INTRAVENOUS | Status: AC
  Administered 2019-06-12: 1 g via INTRAVENOUS
  Filled 2019-06-10 (×2): qty 50

## 2019-06-10 MED ORDER — ACETAMINOPHEN 325 MG PO TABS: 325.0000 mg | ORAL_TABLET | Freq: Four times a day (QID) | ORAL | Status: DC | PRN

## 2019-06-10 MED ORDER — PHENYLEPHRINE HCL-NACL 10-0.9 MG/250ML-% IV SOLN
INTRAVENOUS | Status: AC
  Filled 2019-06-10: qty 250

## 2019-06-10 MED ORDER — METHOCARBAMOL 500 MG PO TABS: 500.0000 mg | ORAL_TABLET | Freq: Four times a day (QID) | ORAL | Status: DC | PRN

## 2019-06-10 MED ORDER — DOCUSATE SODIUM 100 MG PO CAPS
100.0000 mg | ORAL_CAPSULE | Freq: Two times a day (BID) | ORAL | Status: DC
  Administered 2019-06-10 – 2019-06-16 (×10): 100 mg via ORAL
  Filled 2019-06-10 (×12): qty 1

## 2019-06-10 MED ORDER — IRRISEPT - 450ML BOTTLE WITH 0.05% CHG IN STERILE WATER, USP 99.95% OPTIME
TOPICAL | Status: DC | PRN
  Administered 2019-06-10: 450 mL

## 2019-06-10 MED ORDER — HYDROCODONE-ACETAMINOPHEN 7.5-325 MG PO TABS: 1.0000 | ORAL_TABLET | ORAL | Status: DC | PRN

## 2019-06-10 MED ORDER — EPHEDRINE SULFATE 50 MG/ML IJ SOLN
INTRAMUSCULAR | Status: DC | PRN
  Administered 2019-06-10: 5 mg via INTRAVENOUS
  Administered 2019-06-10 (×3): 10 mg via INTRAVENOUS

## 2019-06-10 MED ORDER — PROMETHAZINE HCL 25 MG/ML IJ SOLN: 12.5000 mg | Freq: Four times a day (QID) | INTRAMUSCULAR | Status: DC | PRN

## 2019-06-10 MED ORDER — ONDANSETRON HCL 4 MG/2ML IJ SOLN: 4.0000 mg | Freq: Four times a day (QID) | INTRAMUSCULAR | Status: DC | PRN

## 2019-06-10 MED ORDER — PROMETHAZINE HCL 25 MG PO TABS: 12.5000 mg | ORAL_TABLET | Freq: Four times a day (QID) | ORAL | Status: DC | PRN

## 2019-06-10 MED ORDER — LIDOCAINE HCL (CARDIAC) PF 100 MG/5ML IV SOSY
PREFILLED_SYRINGE | INTRAVENOUS | Status: DC | PRN
  Administered 2019-06-10: 80 mg via INTRAVENOUS

## 2019-06-10 MED ORDER — METHOCARBAMOL 1000 MG/10ML IJ SOLN
500.0000 mg | Freq: Four times a day (QID) | INTRAVENOUS | Status: DC | PRN
  Filled 2019-06-10: qty 5

## 2019-06-10 MED ORDER — TRANEXAMIC ACID 1000 MG/10ML IV SOLN
INTRAVENOUS | Status: DC | PRN
  Administered 2019-06-10: 2000 mg via TOPICAL

## 2019-06-10 MED ORDER — MAGNESIUM CITRATE PO SOLN: 1.0000 | Freq: Once | ORAL | Status: DC | PRN

## 2019-06-10 MED ORDER — BUPIVACAINE-EPINEPHRINE 0.25% -1:200000 IJ SOLN
INTRAMUSCULAR | Status: DC | PRN
  Administered 2019-06-10: 20 mL

## 2019-06-10 MED ORDER — PROPOFOL 1000 MG/100ML IV EMUL
INTRAVENOUS | Status: AC
  Filled 2019-06-10: qty 200

## 2019-06-10 MED ORDER — FENTANYL CITRATE (PF) 250 MCG/5ML IJ SOLN
INTRAMUSCULAR | Status: AC
  Filled 2019-06-10: qty 5

## 2019-06-10 MED ORDER — ONDANSETRON HCL 4 MG/2ML IJ SOLN: 4.0000 mg | Freq: Once | INTRAMUSCULAR | Status: DC | PRN

## 2019-06-10 MED ORDER — SODIUM CHLORIDE 0.9 % IV SOLN: INTRAVENOUS | Status: DC | PRN

## 2019-06-10 MED ORDER — ONDANSETRON HCL 4 MG PO TABS: 4.0000 mg | ORAL_TABLET | Freq: Four times a day (QID) | ORAL | Status: DC | PRN

## 2019-06-10 MED ORDER — 0.9 % SODIUM CHLORIDE (POUR BTL) OPTIME
TOPICAL | Status: DC | PRN
  Administered 2019-06-10: 1000 mL

## 2019-06-10 MED ORDER — ALUM & MAG HYDROXIDE-SIMETH 200-200-20 MG/5ML PO SUSP: 30.0000 mL | ORAL | Status: DC | PRN

## 2019-06-10 MED ORDER — PHENYLEPHRINE HCL (PRESSORS) 10 MG/ML IV SOLN
INTRAVENOUS | Status: DC | PRN
  Administered 2019-06-10 (×3): 80 ug via INTRAVENOUS

## 2019-06-10 MED ORDER — FENTANYL CITRATE (PF) 100 MCG/2ML IJ SOLN
INTRAMUSCULAR | Status: DC | PRN
  Administered 2019-06-10: 25 ug via INTRAVENOUS

## 2019-06-10 MED ORDER — POLYETHYLENE GLYCOL 3350 17 G PO PACK: 17.0000 g | PACK | Freq: Every day | ORAL | Status: DC | PRN

## 2019-06-10 MED ORDER — ENOXAPARIN SODIUM 30 MG/0.3ML ~~LOC~~ SOLN: 30.0000 mg | SUBCUTANEOUS | Status: DC

## 2019-06-10 MED ORDER — ONDANSETRON HCL 4 MG/2ML IJ SOLN
INTRAMUSCULAR | Status: AC
  Filled 2019-06-10: qty 2

## 2019-06-10 MED ORDER — FENTANYL CITRATE (PF) 100 MCG/2ML IJ SOLN: 25.0000 ug | INTRAMUSCULAR | Status: DC | PRN

## 2019-06-10 MED ORDER — PROPOFOL 10 MG/ML IV BOLUS
INTRAVENOUS | Status: AC
  Filled 2019-06-10: qty 20

## 2019-06-10 SURGICAL SUPPLY — 67 items
BAG DECANTER FOR FLEXI CONT (MISCELLANEOUS) ×3 IMPLANT
BIPOLAR PROS AML 44 (Hips) ×2 IMPLANT
BIPOLAR PROS AML 44MM (Hips) ×1 IMPLANT
CELLS DAT CNTRL 66122 CELL SVR (MISCELLANEOUS) IMPLANT
COVER PERINEAL POST (MISCELLANEOUS) ×3 IMPLANT
COVER SURGICAL LIGHT HANDLE (MISCELLANEOUS) ×3 IMPLANT
COVER WAND RF STERILE (DRAPES) ×3 IMPLANT
DERMABOND ADHESIVE PROPEN (GAUZE/BANDAGES/DRESSINGS) ×2
DERMABOND ADVANCED .7 DNX6 (GAUZE/BANDAGES/DRESSINGS) IMPLANT
DRAPE C-ARM 42X72 X-RAY (DRAPES) ×3 IMPLANT
DRAPE POUCH INSTRU U-SHP 10X18 (DRAPES) ×3 IMPLANT
DRAPE STERI IOBAN 125X83 (DRAPES) ×3 IMPLANT
DRAPE U-SHAPE 47X51 STRL (DRAPES) ×6 IMPLANT
DRESSING AQUACEL AG SP 3.5X10 (GAUZE/BANDAGES/DRESSINGS) IMPLANT
DRSG AQUACEL AG SP 3.5X10 (GAUZE/BANDAGES/DRESSINGS) ×3
DURAPREP 26ML APPLICATOR (WOUND CARE) ×6 IMPLANT
ELECT BLADE 4.0 EZ CLEAN MEGAD (MISCELLANEOUS) ×3
ELECT REM PT RETURN 9FT ADLT (ELECTROSURGICAL) ×3
ELECTRODE BLDE 4.0 EZ CLN MEGD (MISCELLANEOUS) ×1 IMPLANT
ELECTRODE REM PT RTRN 9FT ADLT (ELECTROSURGICAL) ×1 IMPLANT
GLOVE BIOGEL PI IND STRL 7.0 (GLOVE) ×1 IMPLANT
GLOVE BIOGEL PI INDICATOR 7.0 (GLOVE) ×2
GLOVE ECLIPSE 7.0 STRL STRAW (GLOVE) ×6 IMPLANT
GLOVE SKINSENSE NS SZ7.5 (GLOVE) ×2
GLOVE SKINSENSE STRL SZ7.5 (GLOVE) ×1 IMPLANT
GLOVE SURG SYN 7.5  E (GLOVE) ×8
GLOVE SURG SYN 7.5 E (GLOVE) ×4 IMPLANT
GLOVE SURG SYN 7.5 PF PI (GLOVE) ×4 IMPLANT
GOWN STRL REIN XL XLG (GOWN DISPOSABLE) ×3 IMPLANT
GOWN STRL REUS W/ TWL LRG LVL3 (GOWN DISPOSABLE) IMPLANT
GOWN STRL REUS W/ TWL XL LVL3 (GOWN DISPOSABLE) ×1 IMPLANT
GOWN STRL REUS W/TWL LRG LVL3 (GOWN DISPOSABLE)
GOWN STRL REUS W/TWL XL LVL3 (GOWN DISPOSABLE) ×2
HANDPIECE INTERPULSE COAX TIP (DISPOSABLE) ×2
HEAD BIPOLAR PROS AML 44 (Hips) IMPLANT
HEAD FEM STD 28X+1.5 STRL (Hips) ×2 IMPLANT
HOOD PEEL AWAY FLYTE STAYCOOL (MISCELLANEOUS) ×6 IMPLANT
IV NS IRRIG 3000ML ARTHROMATIC (IV SOLUTION) ×3 IMPLANT
JET LAVAGE IRRISEPT WOUND (IRRIGATION / IRRIGATOR) ×3
KIT BASIN OR (CUSTOM PROCEDURE TRAY) ×3 IMPLANT
LAVAGE JET IRRISEPT WOUND (IRRIGATION / IRRIGATOR) IMPLANT
MARKER SKIN DUAL TIP RULER LAB (MISCELLANEOUS) ×3 IMPLANT
NDL SPNL 18GX3.5 QUINCKE PK (NEEDLE) ×1 IMPLANT
NEEDLE SPNL 18GX3.5 QUINCKE PK (NEEDLE) ×3 IMPLANT
PACK TOTAL JOINT (CUSTOM PROCEDURE TRAY) ×3 IMPLANT
PACK UNIVERSAL I (CUSTOM PROCEDURE TRAY) ×3 IMPLANT
RETRACTOR WND ALEXIS 18 MED (MISCELLANEOUS) IMPLANT
RTRCTR WOUND ALEXIS 18CM MED (MISCELLANEOUS)
SAW OSC TIP CART 19.5X105X1.3 (SAW) ×3 IMPLANT
SET HNDPC FAN SPRY TIP SCT (DISPOSABLE) ×1 IMPLANT
STAPLER VISISTAT 35W (STAPLE) IMPLANT
STEM CORAIL KA14 (Stem) ×2 IMPLANT
SUT ETHIBOND 2 V 37 (SUTURE) ×3 IMPLANT
SUT MNCRL AB 4-0 PS2 18 (SUTURE) ×2 IMPLANT
SUT VIC AB 0 CT1 27 (SUTURE) ×2
SUT VIC AB 0 CT1 27XBRD ANBCTR (SUTURE) ×1 IMPLANT
SUT VIC AB 1 CTX 18 (SUTURE) ×2 IMPLANT
SUT VIC AB 1 CTX 36 (SUTURE) ×4
SUT VIC AB 1 CTX36XBRD ANBCTR (SUTURE) ×1 IMPLANT
SUT VIC AB 2-0 CT1 27 (SUTURE) ×4
SUT VIC AB 2-0 CT1 TAPERPNT 27 (SUTURE) ×2 IMPLANT
SYR 50ML LL SCALE MARK (SYRINGE) ×3 IMPLANT
TOWEL GREEN STERILE (TOWEL DISPOSABLE) ×3 IMPLANT
TRAY CATH 16FR W/PLASTIC CATH (SET/KITS/TRAYS/PACK) IMPLANT
TRAY FOLEY W/BAG SLVR 16FR (SET/KITS/TRAYS/PACK) ×2
TRAY FOLEY W/BAG SLVR 16FR ST (SET/KITS/TRAYS/PACK) ×1 IMPLANT
YANKAUER SUCT BULB TIP NO VENT (SUCTIONS) ×3 IMPLANT

## 2019-06-10 NOTE — H&P (Signed)

## 2019-06-10 NOTE — Anesthesia Preprocedure Evaluation (Addendum)
Anesthesia Evaluation  Patient identified by MRN, date of birth, ID band Patient awake    Reviewed: Allergy & Precautions, NPO status , Patient's Chart, lab work & pertinent test results, reviewed documented beta blocker date and time   History of Anesthesia Complications Negative for: history of anesthetic complications  Airway Mallampati: II  TM Distance: >3 FB Neck ROM: Full    Dental  (+) Dental Advisory Given, Teeth Intact   Pulmonary former smoker,  Quit smoking 1973   Pulmonary exam normal breath sounds clear to auscultation       Cardiovascular hypertension, Pt. on medications and Pt. on home beta blockers +CHF  Normal cardiovascular exam+ Valvular Problems/Murmurs (moderate MR) MR  Rhythm:Regular Rate:Normal   '20 TTE - EF 60-65%. There is mild asymmetric left ventricular hypertrophy. Left ventricular diastolic Doppler parameters are consistent with impaired relaxation. Mild outflow tract obstruction with 30 mm gradient. Left atrial size was mild-moderately dilated. Right atrial size was mildly dilated. Moderate MR.    HLD   Neuro/Psych PSYCHIATRIC DISORDERS Depression negative neurological ROS     GI/Hepatic negative GI ROS, Neg liver ROS,   Endo/Other  diabetes, Well Controlled, Type 2hperparathyroid  Renal/GU ESRF and DialysisRenal diseaseHD catheter in place     Musculoskeletal  (+) Arthritis , Osteoarthritis,  L hip fx Gout DDD spondylolisthesis   Abdominal Normal abdominal exam  (+)   Peds  Hematology  (+) Blood dyscrasia, anemia , REFUSES BLOOD PRODUCTS, JEHOVAH'S WITNESShct 36.7, plt 283   Anesthesia Other Findings   Reproductive/Obstetrics                            Anesthesia Physical Anesthesia Plan  ASA: IV  Anesthesia Plan: Spinal and MAC   Post-op Pain Management:    Induction:   PONV Risk Score and Plan: 2 and Propofol infusion and TIVA  Airway  Management Planned: Natural Airway and Nasal Cannula  Additional Equipment: None  Intra-op Plan:   Post-operative Plan:   Informed Consent: I have reviewed the patients History and Physical, chart, labs and discussed the procedure including the risks, benefits and alternatives for the proposed anesthesia with the patient or authorized representative who has indicated his/her understanding and acceptance.       Plan Discussed with: CRNA  Anesthesia Plan Comments:        Anesthesia Quick Evaluation

## 2019-06-10 NOTE — Progress Notes (Signed)
Patient ID: Bianca Myers, female   DOB: [birth year], 74 y.o.   MRN: [id number]  PROGRESS NOTE    Bianca Myers  [id number] DOB: Mar 04, 1945 DOA: 06/07/2019 PCP: Bianca Berger, MD   Brief Narrative:  74 year old female with history of end-stage renal disease on hemodialysis, diabetes mellitus type 2, hypertension, DJD, MSSA bacteremia in January 2021 presented on 06/07/2019 after a fall.  Apparently, she went to her dialysis center and as per schedule on 06/06/2019 but could not get her dialysis done due to issues with her AV fistula, subsequently went back home and then subsequently fell down, unclear if she had a syncopal event.  In the ED, patient was confused with creatinine of 13, potassium of 6.9 and bicarb of 14.  Nephrology was consulted.  She was also found to have left hip fracture for which orthopedics was consulted  Assessment & Plan:  Left hip fracture status post fall -Orthopedics following and planning for surgical intervention today.   -Fall precautions.  Pain management  Hyperkalemia End-stage renal disease on hemodialysis Uremia acute metabolic encephalopathy -Presented with hyperkalemia and uremia with confusion most likely from uremia.  Patient had difficulty cannulation of fistula recently and missed her dialysis as an outpatient recently.  Nephrology following. -Status post IR guided [facility] HD catheter placement today.  Dialysis as per nephrology schedule. -Continues to receive additional dialysis today. -Monitor mental status.  Fall precautions.  Mental status improving.  Appreciate neurology input.  MRI of the brain and EEG were normal. Vascular surgery on board, they are planning for fistulogram and investigation after orthopedic surgery.  Leukocytosis -Probably reactive.  Improving  Mildly elevated high-sensitivity troponin -No chest pain.  Likely secondary to ESRD.  No further work-up needed  Hypertension -Monitor blood pressure.  Stable.  Anemia of  chronic disease -From renal failure.  Monitor -Patient is Jehovah's Witness.  She wanted to make sure that we mention she wants to avoid blood transfusions.  Generalized deconditioning -We will need PT evaluation and may need rehab after left hip surgery.  DVT prophylaxis: Heparin Code Status: Full Family Communication: Spoke to patient at bedside.  Surgery communicating with patient's son. Disposition Plan: Will need to remain inpatient for management of hyperkalemia or hip fracture.  Plan for surgery today.  May need a skilled nursing facility placement after that. Patient will also need permanent access, she has dysfunctional right arm fistula.  Consultants: Orthopedic/nephrology/vascular surgery  Procedures:  IR guided [facility] HD catheter placement on 06/08/2019  Antimicrobials:  None  Subjective: Patient seen and examined.  No overnight events.  She was still somehow not able to recollect events. Objective: Vitals:   06/09/19 2035 06/09/19 2145 06/10/19 0450 06/10/19 0913  BP: (!) 166/29 139/68 135/81 (!) 155/93  Pulse: 62 65 71 62  Resp: 16 20 18 16   Temp: 98.7 F (37.1 C) 98 F (36.7 C) (!) 97.4 F (36.3 C) 98 F (36.7 C)  TempSrc: Oral Oral Oral   SpO2: 97%  95% 98%  Weight:      Height:        Intake/Output Summary (Last 24 hours) at 06/10/2019 1317 Last data filed at 06/10/2019 0804 Gross per 24 hour  Intake 0 ml  Output --  Net 0 ml   Filed Weights   06/07/19 1037 06/09/19 1715  Weight: 75.8 kg 77 kg    Examination:  General exam: Appears calm and comfortable.  Chronically sick looking.  Not in any distress.  On room air. Respiratory system: Bilateral clear.  Cardiovascular system: S1 & S2 heard, Rate controlled Gastrointestinal system: Abdomen is nondistended, soft and nontender. Normal bowel sounds heard. Extremities: No cyanosis, clubbing; trace lower extremity edema Central nervous system: Alert awake x3.  Skin: No rashes, lesions or ulcers Psychiatry:  Normal mood.  Flat affect.    Data Reviewed: I have personally reviewed following labs and imaging studies  CBC: Recent Labs  Lab 06/07/19 1116 06/08/19 0520 06/08/19 1915 06/09/19 0336  WBC 16.1* 11.2* 12.0* 10.8*  NEUTROABS 14.3*  --  9.9* 8.6*  HGB 12.5 11.9* 13.2 11.9*  HCT 40.8 38.0 41.0 36.7  MCV 103.6* 101.1* 97.9 97.9  PLT 286 309 290 671   Basic Metabolic Panel: Recent Labs  Lab 06/07/19 1116 06/07/19 1725 06/08/19 0520 06/08/19 1551 06/09/19 0336  NA 134*  --  131* 135 137  K 6.9*  --  7.0* 5.4* 4.1  CL 96*  --  93* 94* 92*  CO2 14*  --  12* 14* 21*  GLUCOSE 129*  --  169* 144* 143*  BUN 100*  --  120* 44* 58*  CREATININE 13.10*  --  14.25* 7.63* 9.07*  CALCIUM 9.2  --  9.2 8.8* 8.6*  MG  --  2.2  --   --   --    GFR: Estimated Creatinine Clearance: 5.9 mL/min (A) (by C-G formula based on SCr of 9.07 mg/dL (H)). Liver Function Tests: Recent Labs  Lab 06/07/19 1116 06/08/19 1551  AST 29 55*  ALT <5 <5  ALKPHOS 84 77  BILITOT 0.9 1.1  PROT 7.9 8.0  ALBUMIN 3.4* 3.5   Recent Labs  Lab 06/07/19 1116  LIPASE 29   Recent Labs  Lab 06/08/19 1551  AMMONIA 37*   Coagulation Profile: Recent Labs  Lab 06/08/19 0603  INR 1.3*   Cardiac Enzymes: No results for input(s): CKTOTAL, CKMB, CKMBINDEX, TROPONINI in the last 168 hours. BNP (last 3 results) No results for input(s): PROBNP in the last 8760 hours. HbA1C: No results for input(s): HGBA1C in the last 72 hours. CBG: Recent Labs  Lab 06/08/19 1522 06/09/19 0749 06/10/19 0104  GLUCAP 168* 143* 272*   Lipid Profile: No results for input(s): CHOL, HDL, LDLCALC, TRIG, CHOLHDL, LDLDIRECT in the last 72 hours. Thyroid Function Tests: No results for input(s): TSH, T4TOTAL, FREET4, T3FREE, THYROIDAB in the last 72 hours. Anemia Panel: No results for input(s): VITAMINB12, FOLATE, FERRITIN, TIBC, IRON, RETICCTPCT in the last 72 hours. Sepsis Labs: Recent Labs  Lab 06/07/19 1149  06/07/19 1309  LATICACIDVEN 1.2 1.2    Recent Results (from the past 240 hour(s))  Respiratory Panel by RT PCR (Flu A&B, Covid) - Nasopharyngeal Swab     Status: None   Collection Time: 06/07/19  1:03 PM   Specimen: Nasopharyngeal Swab  Result Value Ref Range Status   SARS Coronavirus 2 by RT PCR NEGATIVE NEGATIVE Final    Comment: (NOTE) SARS-CoV-2 target nucleic acids are NOT DETECTED. The SARS-CoV-2 RNA is generally detectable in upper respiratoy specimens during the acute phase of infection. The lowest concentration of SARS-CoV-2 viral copies this assay can detect is 131 copies/mL. A negative result does not preclude SARS-Cov-2 infection and should not be used as the sole basis for treatment or other patient management decisions. A negative result may occur with  improper specimen collection/handling, submission of specimen other than nasopharyngeal swab, presence of viral mutation(s) within the areas targeted by this assay, and inadequate number of viral copies (<131 copies/mL). A negative result must be  combined with clinical observations, patient history, and epidemiological information. The expected result is Negative. Fact Sheet for Patients:  PinkCheek.be Fact Sheet for Healthcare Providers:  GravelBags.it This test is not yet ap proved or cleared by the [place] FDA and  has been authorized for detection and/or diagnosis of SARS-CoV-2 by FDA under an Emergency Use Authorization (EUA). This EUA will remain  in effect (meaning this test can be used) for the duration of the COVID-19 declaration under Section 564(b)(1) of the Act, 21 U.S.C. section 360bbb-3(b)(1), unless the authorization is terminated or revoked sooner.    Influenza A by PCR NEGATIVE NEGATIVE Final   Influenza B by PCR NEGATIVE NEGATIVE Final    Comment: (NOTE) The Xpert Xpress SARS-CoV-2/FLU/RSV assay is intended as an aid in  the  diagnosis of influenza from Nasopharyngeal swab specimens and  should not be used as a sole basis for treatment. Nasal washings and  aspirates are unacceptable for Xpert Xpress SARS-CoV-2/FLU/RSV  testing. Fact Sheet for Patients: PinkCheek.be Fact Sheet for Healthcare Providers: GravelBags.it This test is not yet approved or cleared by the [place] FDA and  has been authorized for detection and/or diagnosis of SARS-CoV-2 by  FDA under an Emergency Use Authorization (EUA). This EUA will remain  in effect (meaning this test can be used) for the duration of the  Covid-19 declaration under Section 564(b)(1) of the Act, 21  U.S.C. section 360bbb-3(b)(1), unless the authorization is  terminated or revoked. Performed at [facility], [place] [address] [place], [place] [address]   Culture, blood (routine x 2)     Status: None (Preliminary result)   Collection Time: 06/07/19  6:48 PM   Specimen: BLOOD  Result Value Ref Range Status   Specimen Description BLOOD RIGHT ANTECUBITAL  Final   Special Requests   Final    BOTTLES DRAWN AEROBIC AND ANAEROBIC Blood Culture adequate volume   Culture   Final    NO GROWTH 3 DAYS Performed at [facility], [place] [address] [place], [place] [address]    Report Status PENDING  Incomplete  Culture, blood (routine x 2)     Status: None (Preliminary result)   Collection Time: 06/07/19 11:04 PM   Specimen: BLOOD  Result Value Ref Range Status   Specimen Description BLOOD RIGHT ANTECUBITAL  Final   Special Requests   Final    BOTTLES DRAWN AEROBIC AND ANAEROBIC Blood Culture adequate volume   Culture   Final    NO GROWTH 2 DAYS Performed at [facility], [place] [address] [place],  [address]    Report Status PENDING  Incomplete  Surgical pcr screen     Status: None   Collection Time: 06/09/19  6:43 AM   Specimen: Nasal Mucosa; Nasal Swab  Result Value Ref Range Status    MRSA, PCR NEGATIVE NEGATIVE Final   Staphylococcus aureus NEGATIVE NEGATIVE Final    Comment: (NOTE) The Xpert SA Assay (FDA approved for NASAL specimens in patients 34 years of age and older), is one component of a comprehensive surveillance program. It is not intended to diagnose infection nor to guide or monitor treatment. Performed at [facility], [place] [address] [place],  [address]          Radiology Studies: EEG  Result Date: 06/08/2019 Lora Havens, MD     06/08/2019  8:19 PM Patient Name: Bianca Myers MRN: [id number] Epilepsy Attending: Lora Havens Referring Physician/Provider: Dr Aline August Date:  06/08/2019 Duration: 24.58 mins Patient history: 73yo F with ams and twitching movements of bilateral upper extremities EEG to evaluate for seizure. Level of alertness: awake AEDs during EEG study: None Technical aspects: This EEG study was done with scalp electrodes positioned according to the 10-20 International system of electrode placement. Electrical activity was acquired at a sampling rate of 500Hz  and reviewed with a high frequency filter of 70Hz  and a low frequency filter of 1Hz . EEG data were recorded continuously and digitally stored. DESCRIPTION: No clear posterior dominant rhythm was seen. EEG showed continuous generalized polymorphic 3-6hz  theta-delta slowing. Photic driving was not seen during photic stimulation. Hyperventilation was not performed. ABNORMALITY - Continuous slow, generalized IMPRESSION: This study is suggestive of moderate diffuse encephalopathy, non specific to etiology. No seizures or epileptiform discharges were seen throughout the recording. Lora Havens   MR BRAIN WO CONTRAST  Result Date: 06/08/2019 CLINICAL DATA:  Follow-up examination for acute confusion, metabolic encephalopathy, evaluate for possible stroke. EXAM: MRI HEAD WITHOUT CONTRAST TECHNIQUE: Multiplanar, multiecho pulse sequences of the brain and surrounding  structures were obtained without intravenous contrast. COMPARISON:  Prior CT from 06/07/2019 as well as recent brain MRI from 02/28/2019. FINDINGS: Brain: Examination moderately degraded by motion artifact. Generalized age-related cerebral atrophy. Patchy T2/FLAIR hyperintensity within the periventricular and deep white matter both cerebral hemispheres most consistent with chronic small vessel ischemic disease, moderate in nature. Patchy involvement of the pons present. 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 No abnormal foci of restricted diffusion to suggest acute or subacute ischemia. Gray-white matter differentiation maintained. No encephalomalacia to suggest chronic cortical infarction. No foci of susceptibility artifact to suggest acute or chronic intracranial hemorrhage. No mass lesion, midline shift or mass effect. Ventricles normal size without hydrocephalus. No extra-axial fluid collection. No made of an empty sella. Midline structures intact. Vascular: Major intracranial vascular flow voids are maintained. Skull and upper cervical spine: Craniocervical junction within normal limits. Bone marrow signal intensity normal. No scalp soft tissue abnormality. Sinuses/Orbits: Patient status post bilateral ocular lens replacement. Globes and orbital soft tissues demonstrate no acute finding. Paranasal sinuses are clear. Trace left mastoid effusion noted, of doubtful significance. Inner ear structures grossly normal. Other: None. IMPRESSION: 1. No acute intracranial infarct or other abnormality. 2. Age-related cerebral atrophy with moderate chronic small vessel ischemic disease, stable from previous. Electronically Signed   By: Jeannine Boga M.D.   On: 06/08/2019 23:56        Scheduled Meds: . albuterol  2.5 mg Nebulization Once  . atorvastatin  10 mg Oral QHS  . Chlorhexidine Gluconate Cloth  6 each Topical Q0600  . Chlorhexidine Gluconate Cloth  6  each Topical Q0600  . escitalopram  10 mg Oral Daily  . ferric citrate  420 mg Oral TID WC  . heparin  2,000 Units Dialysis Once in dialysis  . heparin injection (subcutaneous)  5,000 Units Subcutaneous Q8H  . labetalol  200 mg Oral BID  . povidone-iodine  2 application Topical Once  . povidone-iodine  2 application Topical Once  . sodium zirconium cyclosilicate  10 g Oral Once  . tranexamic acid (CYKLOKAPRON) topical - INTRAOP  2,000 mg Topical To SS-Surg   Continuous Infusions: .  ceFAZolin (ANCEF) IV    . tranexamic acid      Total time spent: 30 minutes     Barb Merino, MD [facility] 06/10/2019, 1:17 PM

## 2019-06-10 NOTE — Op Note (Signed)
° °  Procedure Note Bianca Myers   [id number]  Pre-op Diagnosis: LEFT FEMORAL NECK FRACTURE     Post-op Diagnosis: same   Operative Procedures  1. Prosthetic replacement for femoral neck fracture. CPT [phone]  Personnel  Surgeon(s): Leandrew Koyanagi, MD  ASSIST: Madalyn Rob, PA-C; necessary for the timely completion of procedure and due to complexity of procedure.   Anesthesia: spinal  Prosthesis: Depuy Femur: Corail KA 14 Head: 44 mm size: +1.5 Bearing Type: bipolar  Hip Hemiarthroplasty (Anterior Approach) Op Note:  After informed consent was obtained and the operative extremity marked in the holding area, the patient was brought back to the operating room and placed supine on the HANA table. Next, the operative extremity was prepped and draped in normal sterile fashion. Surgical timeout occurred verifying patient identification, surgical site, surgical procedure and administration of antibiotics.  A modified anterior Smith-Peterson approach to the hip was performed, using the interval between tensor fascia lata and sartorius.  Dissection was carried bluntly down onto the anterior hip capsule. The lateral femoral circumflex vessels were identified and coagulated. A capsulotomy was performed and the capsular flaps tagged for later repair.  The neck osteotomy was performed. The femoral head was removed and found a 44 mm head was the appropriate fit.    We then turned our attention to the femur.  After placing the femoral hook, the leg was taken to externally rotated, extended and adducted position taking care to perform soft tissue releases to allow for adequate mobilization of the femur. Soft tissue was cleared from the shoulder of the greater trochanter and the hook elevator used to improve exposure of the proximal femur. Sequential broaching performed up to a size 14. Trial neck and head were placed. The leg was brought back up to neutral and the construct reduced. The position  and sizing of components, offset and leg lengths were checked using fluoroscopy. Stability of the construct was checked in extension and external rotation without any subluxation or impingement of prosthesis. We dislocated the prosthesis, dropped the leg back into position, removed trial components, and irrigated copiously. The final stem and head was then placed, the leg brought back up, the system reduced and fluoroscopy used to verify positioning.  We irrigated, obtained hemostasis and closed the capsule using #2 ethibond suture.  The fascia was closed with #1 vicryl plus, the deep fat layer was closed with 0 vicryl, the subcutaneous layers closed with 2.0 Vicryl Plus and the skin closed with staples. A sterile dressing was applied. The patient was awakened in the operating room and taken to recovery in stable condition. All sponge, needle, and instrument counts were correct at the end of the case.   Position: supine  Complications: see description of procedure.  Time Out: performed   Drains/Packing: none  Estimated blood loss: see anesthesia record  Returned to Recovery Room: in good condition.   Antibiotics: yes   Mechanical VTE (DVT) Prophylaxis: sequential compression devices, TED thigh-high  Chemical VTE (DVT) Prophylaxis: lovenox  Fluid Replacement: Crystalloid: see anesthesia record  Specimens Removed: 1 to pathology   Sponge and Instrument Count Correct? yes   PACU: portable radiograph - low AP   Admission: inpatient status, start PT & OT POD#1  Plan/RTC: Return in 2 weeks for staple removal. Return in 6 weeks to see MD.  Weight Bearing/Load Lower Extremity: full  Hip precautions: none  N. Eduard Roux, MD [facility] 4:46 PM   * No implants in log *

## 2019-06-10 NOTE — Progress Notes (Signed)
Subjective:  Alert , for Hip surgery today   Objective Vital signs in last 24 hours: Vitals:   06/09/19 2145 06/10/19 0450 06/10/19 0913 06/10/19 1441  BP: 139/68 135/81 (!) 155/93   Pulse: 65 71 62   Resp: 20 18 16    Temp: 98 F (36.7 C) (!) 97.4 F (36.3 C) 98 F (36.7 C)   TempSrc: Oral Oral    SpO2:  95% 98%   Weight:    77 kg  Height:    5\' 7"  (1.702 m)   Weight change:   Physical Exam: General:alert Eldely female,, nad  Lungs: CTA bilaterally Heart:RRR, no rub  Abdomen: soft NT / ND  Lower extremities: No LE edema   Dialysis Access:LUE AVF +bruit; Temp cath right neck   Dialysis:  [facility] MWF 4h 350/800 EDW 77kg 2K/2.25Ca UFP 4 AVF Heparin 2000 Mircera 200 (last 3/31)  Problem/Plan:: 1. Confusion/AMS on 4/7. Concern for seizure/uremia. High BUN120/Cr14.5  on admission - EEG nonspecific. Improved. Today Appears at baseline BUN 58  Cr 9.07  2. Left femoral neck fracture s/p syncopal event at home. [name]  surgery today  3. Hyperkalemia - Resolved with HD.  4. ESRD -HD MWF. Missed HD Monday d/t difficult cannulation of AVF.Unable to cannulate fistula Tuesday.  Appreciate IR placement of temp cath 4/7.  Had HD 4/7. High BUN120/ creat.14.5  HD again  4/8. Improved BUN 58 Scr 9.07  HD Sat -then resume MWF next week.  5. AVF malfunction - ongoing difficulty with cannulation. VVS consulted- will need fistulogram when stable.   6. Hypertension/volume - Hypertensive on arrival. Now controlled. On amlodipine 5, labetalol 200 bid. 7. Anemia - Hgb 11.9   No ESA needs. 8. Metabolic bone disease - Continue Auryixa binders/VDRA 9. Nutrition - Renal diet/fluid restriction when eating.  Bianca Haber, PA-C [facility] Beeper [phone] 06/10/2019,3:10 PM  LOS: 3 days   Labs: Basic Metabolic Panel: Recent Labs  Lab 06/08/19 0520 06/08/19 1551 06/09/19 0336  NA 131* 135 137  K 7.0* 5.4* 4.1  CL 93* 94* 92*  CO2 12* 14* 21*  GLUCOSE 169* 144*  143*  BUN 120* 44* 58*  CREATININE 14.25* 7.63* 9.07*  CALCIUM 9.2 8.8* 8.6*   Liver Function Tests: Recent Labs  Lab 06/07/19 1116 06/08/19 1551  AST 29 55*  ALT <5 <5  ALKPHOS 84 77  BILITOT 0.9 1.1  PROT 7.9 8.0  ALBUMIN 3.4* 3.5   Recent Labs  Lab 06/07/19 1116  LIPASE 29   Recent Labs  Lab 06/08/19 1551  AMMONIA 37*   CBC: Recent Labs  Lab 06/07/19 1116 06/07/19 1116 06/08/19 0520 06/08/19 1915 06/09/19 0336  WBC 16.1*   < > 11.2* 12.0* 10.8*  NEUTROABS 14.3*  --   --  9.9* 8.6*  HGB 12.5   < > 11.9* 13.2 11.9*  HCT 40.8   < > 38.0 41.0 36.7  MCV 103.6*  --  101.1* 97.9 97.9  PLT 286   < > 309 290 283   < > = values in this interval not displayed.   Cardiac Enzymes: No results for input(s): CKTOTAL, CKMB, CKMBINDEX, TROPONINI in the last 168 hours. CBG: Recent Labs  Lab 06/08/19 1522 06/09/19 0749 06/10/19 0104  GLUCAP 168* 143* 272*    Studies/Results: EEG  Result Date: 06/08/2019 Lora Havens, MD     06/08/2019  8:19 PM Patient Name: Bianca Myers MRN: [id number] Epilepsy Attending: Lora Havens Referring Physician/Provider: Dr Aline August Date: 06/08/2019  Duration: 24.58 mins Patient history: 74yo F with ams and twitching movements of bilateral upper extremities EEG to evaluate for seizure. Level of alertness: awake AEDs during EEG study: None Technical aspects: This EEG study was done with scalp electrodes positioned according to the 10-20 International system of electrode placement. Electrical activity was acquired at a sampling rate of 500Hz  and reviewed with a high frequency filter of 70Hz  and a low frequency filter of 1Hz . EEG data were recorded continuously and digitally stored. DESCRIPTION: No clear posterior dominant rhythm was seen. EEG showed continuous generalized polymorphic 3-6hz  theta-delta slowing. Photic driving was not seen during photic stimulation. Hyperventilation was not performed. ABNORMALITY - Continuous slow,  generalized IMPRESSION: This study is suggestive of moderate diffuse encephalopathy, non specific to etiology. No seizures or epileptiform discharges were seen throughout the recording. Lora Havens   MR BRAIN WO CONTRAST  Result Date: 06/08/2019 CLINICAL DATA:  Follow-up examination for acute confusion, metabolic encephalopathy, evaluate for possible stroke. EXAM: MRI HEAD WITHOUT CONTRAST TECHNIQUE: Multiplanar, multiecho pulse sequences of the brain and surrounding structures were obtained without intravenous contrast. COMPARISON:  Prior CT from 06/07/2019 as well as recent brain MRI from 02/28/2019. FINDINGS: Brain: Examination moderately degraded by motion artifact. Generalized age-related cerebral atrophy. Patchy T2/FLAIR hyperintensity within the periventricular and deep white matter both cerebral hemispheres most consistent with chronic small vessel ischemic disease, moderate in nature. Patchy involvement of the pons present. 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 No abnormal foci of restricted diffusion to suggest acute or subacute ischemia. Gray-white matter differentiation maintained. No encephalomalacia to suggest chronic cortical infarction. No foci of susceptibility artifact to suggest acute or chronic intracranial hemorrhage. No mass lesion, midline shift or mass effect. Ventricles normal size without hydrocephalus. No extra-axial fluid collection. No made of an empty sella. Midline structures intact. Vascular: Major intracranial vascular flow voids are maintained. Skull and upper cervical spine: Craniocervical junction within normal limits. Bone marrow signal intensity normal. No scalp soft tissue abnormality. Sinuses/Orbits: Patient status post bilateral ocular lens replacement. Globes and orbital soft tissues demonstrate no acute finding. Paranasal sinuses are clear. Trace left mastoid effusion noted, of doubtful significance. Inner ear  structures grossly normal. Other: None. IMPRESSION: 1. No acute intracranial infarct or other abnormality. 2. Age-related cerebral atrophy with moderate chronic small vessel ischemic disease, stable from previous. Electronically Signed   By: Bianca Myers Boga M.D.   On: 06/08/2019 23:56   Medications: . sodium chloride 10 mL/hr at 06/10/19 1447  .  ceFAZolin (ANCEF) IV    . tranexamic acid     . [MAR Hold] albuterol  2.5 mg Nebulization Once  . [MAR Hold] atorvastatin  10 mg Oral QHS  . [MAR Hold] Chlorhexidine Gluconate Cloth  6 each Topical Q0600  . [MAR Hold] Chlorhexidine Gluconate Cloth  6 each Topical Q0600  . [MAR Hold] escitalopram  10 mg Oral Daily  . [MAR Hold] ferric citrate  420 mg Oral TID WC  . [MAR Hold] heparin  2,000 Units Dialysis Once in dialysis  . [MAR Hold] heparin injection (subcutaneous)  5,000 Units Subcutaneous Q8H  . [MAR Hold] labetalol  200 mg Oral BID  . povidone-iodine  2 application Topical Once  . povidone-iodine  2 application Topical Once  . [MAR Hold] sodium zirconium cyclosilicate  10 g Oral Once  . tranexamic acid (CYKLOKAPRON) topical - INTRAOP  2,000 mg Topical To SS-Surg

## 2019-06-10 NOTE — Telephone Encounter (Signed)
Erlinda Hong has spoken to patient's son

## 2019-06-10 NOTE — Progress Notes (Addendum)
PHARMACY NOTE:  ANTIMICROBIAL RENAL DOSAGE ADJUSTMENT  Current antimicrobial regimen includes a mismatch between antimicrobial dosage and estimated renal function.  As per policy approved by the Pharmacy & Therapeutics and Medical Executive Committees, the antimicrobial dosage will be adjusted accordingly.  Current antimicrobial dosage:  Cefazolin 2 gm IV Q 6 hrs X 3 doses  Indication: Surgical prophylaxis  Renal Function:  Estimated Creatinine Clearance: 5.9 mL/min (A) (by C-G formula based on SCr of 9.07 mg/dL (H)). [x]      On intermittent HD, scheduled: MWF (next HD planned for tomorrow (Sat) []      On CRRT    Antimicrobial dosage has been changed to:  Pt rec'd cefazolin 2 gm IV X 1 pre op; due to HD status and no plans for HD until tomorrow, pt will not require any further doses of cefazolin to provider 24 hrs of surgical prophylaxis, unless pt has HD session on Saturday prior to 1530 (if pt does have HD session on Saturday prior 1530, will give cefazolin 1 gm I X 1 AFTER HD session completed)   Thank you for allowing pharmacy to be a part of this patient's care.  Gillermina Hu, PharmD, BCPS, [facility] Clinical Pharmacist 06/10/2019 6:53 PM

## 2019-06-10 NOTE — Telephone Encounter (Signed)
See below

## 2019-06-10 NOTE — Telephone Encounter (Signed)
Received call from [place] from [facility] advised patient's son Brenton Grills) called an asked for a call back from Dr Erlinda Hong prior to his mother's surgery this afternoon. The number to contact Brenton Grills is [phone]

## 2019-06-10 NOTE — Transfer of Care (Signed)
Immediate Anesthesia Transfer of Care Note  Patient: Bianca Myers  Procedure(s) Performed: TOTAL HIP ARTHROPLASTY ANTERIOR APPROACH (Left Hip)  Patient Location: PACU  Anesthesia Type:Spinal  Level of Consciousness: drowsy  Airway & Oxygen Therapy: Patient Spontanous Breathing and Patient connected to nasal cannula oxygen  Post-op Assessment: Report given to RN and Post -op Vital signs reviewed and stable  Post vital signs: Reviewed and stable  Last Vitals:  Vitals Value Taken Time  BP 100/68 06/10/19 1732  Temp 36.6 C 06/10/19 1722  Pulse 144 06/10/19 1733  Resp 13 06/10/19 1736  SpO2 97 % 06/10/19 1732  Vitals shown include unvalidated device data.  Last Pain:  Vitals:   06/10/19 1722  TempSrc:   PainSc: Asleep      Patients Stated Pain Goal: 2 (38/45/36 4680)  Complications: No apparent anesthesia complications

## 2019-06-11 ENCOUNTER — Inpatient Hospital Stay (HOSPITAL_COMMUNITY): Payer: Medicare HMO

## 2019-06-11 DIAGNOSIS — S72002A Fracture of unspecified part of neck of left femur, initial encounter for closed fracture: Secondary | ICD-10-CM | POA: Diagnosis not present

## 2019-06-11 LAB — CBC WITH DIFFERENTIAL/PLATELET
Abs Immature Granulocytes: 0.04 10*3/uL (ref 0.00–0.07)
Basophils Absolute: 0 10*3/uL (ref 0.0–0.1)
Basophils Relative: 0 %
Eosinophils Absolute: 0.2 10*3/uL (ref 0.0–0.5)
Eosinophils Relative: 2 %
HCT: 31.9 % — ABNORMAL LOW (ref 36.0–46.0)
Hemoglobin: 10.1 g/dL — ABNORMAL LOW (ref 12.0–15.0)
Immature Granulocytes: 0 %
Lymphocytes Relative: 12 %
Lymphs Abs: 1.2 10*3/uL (ref 0.7–4.0)
MCH: 31.2 pg (ref 26.0–34.0)
MCHC: 31.7 g/dL (ref 30.0–36.0)
MCV: 98.5 fL (ref 80.0–100.0)
Monocytes Absolute: 1.1 10*3/uL — ABNORMAL HIGH (ref 0.1–1.0)
Monocytes Relative: 12 %
Neutro Abs: 7 10*3/uL (ref 1.7–7.7)
Neutrophils Relative %: 74 %
Platelets: 195 10*3/uL (ref 150–400)
RBC: 3.24 MIL/uL — ABNORMAL LOW (ref 3.87–5.11)
RDW: 17.7 % — ABNORMAL HIGH (ref 11.5–15.5)
WBC: 9.5 10*3/uL (ref 4.0–10.5)
nRBC: 0.2 % (ref 0.0–0.2)

## 2019-06-11 LAB — BASIC METABOLIC PANEL
Anion gap: 19 — ABNORMAL HIGH (ref 5–15)
BUN: 58 mg/dL — ABNORMAL HIGH (ref 8–23)
CO2: 23 mmol/L (ref 22–32)
Calcium: 8.3 mg/dL — ABNORMAL LOW (ref 8.9–10.3)
Chloride: 91 mmol/L — ABNORMAL LOW (ref 98–111)
Creatinine, Ser: 7.63 mg/dL — ABNORMAL HIGH (ref 0.44–1.00)
GFR calc Af Amer: 6 mL/min — ABNORMAL LOW (ref >60)
GFR calc non Af Amer: 5 mL/min — ABNORMAL LOW (ref >60)
Glucose, Bld: 132 mg/dL — ABNORMAL HIGH (ref 70–99)
Potassium: 4.4 mmol/L (ref 3.5–5.1)
Sodium: 133 mmol/L — ABNORMAL LOW (ref 135–145)

## 2019-06-11 LAB — MAGNESIUM: Magnesium: 2 mg/dL (ref 1.7–2.4)

## 2019-06-11 LAB — TYPE AND SCREEN
ABO/RH(D): O POS
Antibody Screen: NEGATIVE

## 2019-06-11 LAB — ABO/RH: ABO/RH(D): O POS

## 2019-06-11 LAB — PHOSPHORUS: Phosphorus: 9.1 mg/dL — ABNORMAL HIGH (ref 2.5–4.6)

## 2019-06-11 IMAGING — DX DG PORTABLE PELVIS
1 series · 1 of 1 positions shown · non-contrast
Comparison: None.

CLINICAL DATA: Status post hip replacement.

EXAM:
PORTABLE PELVIS 1-2 VIEWS

[pelvis]
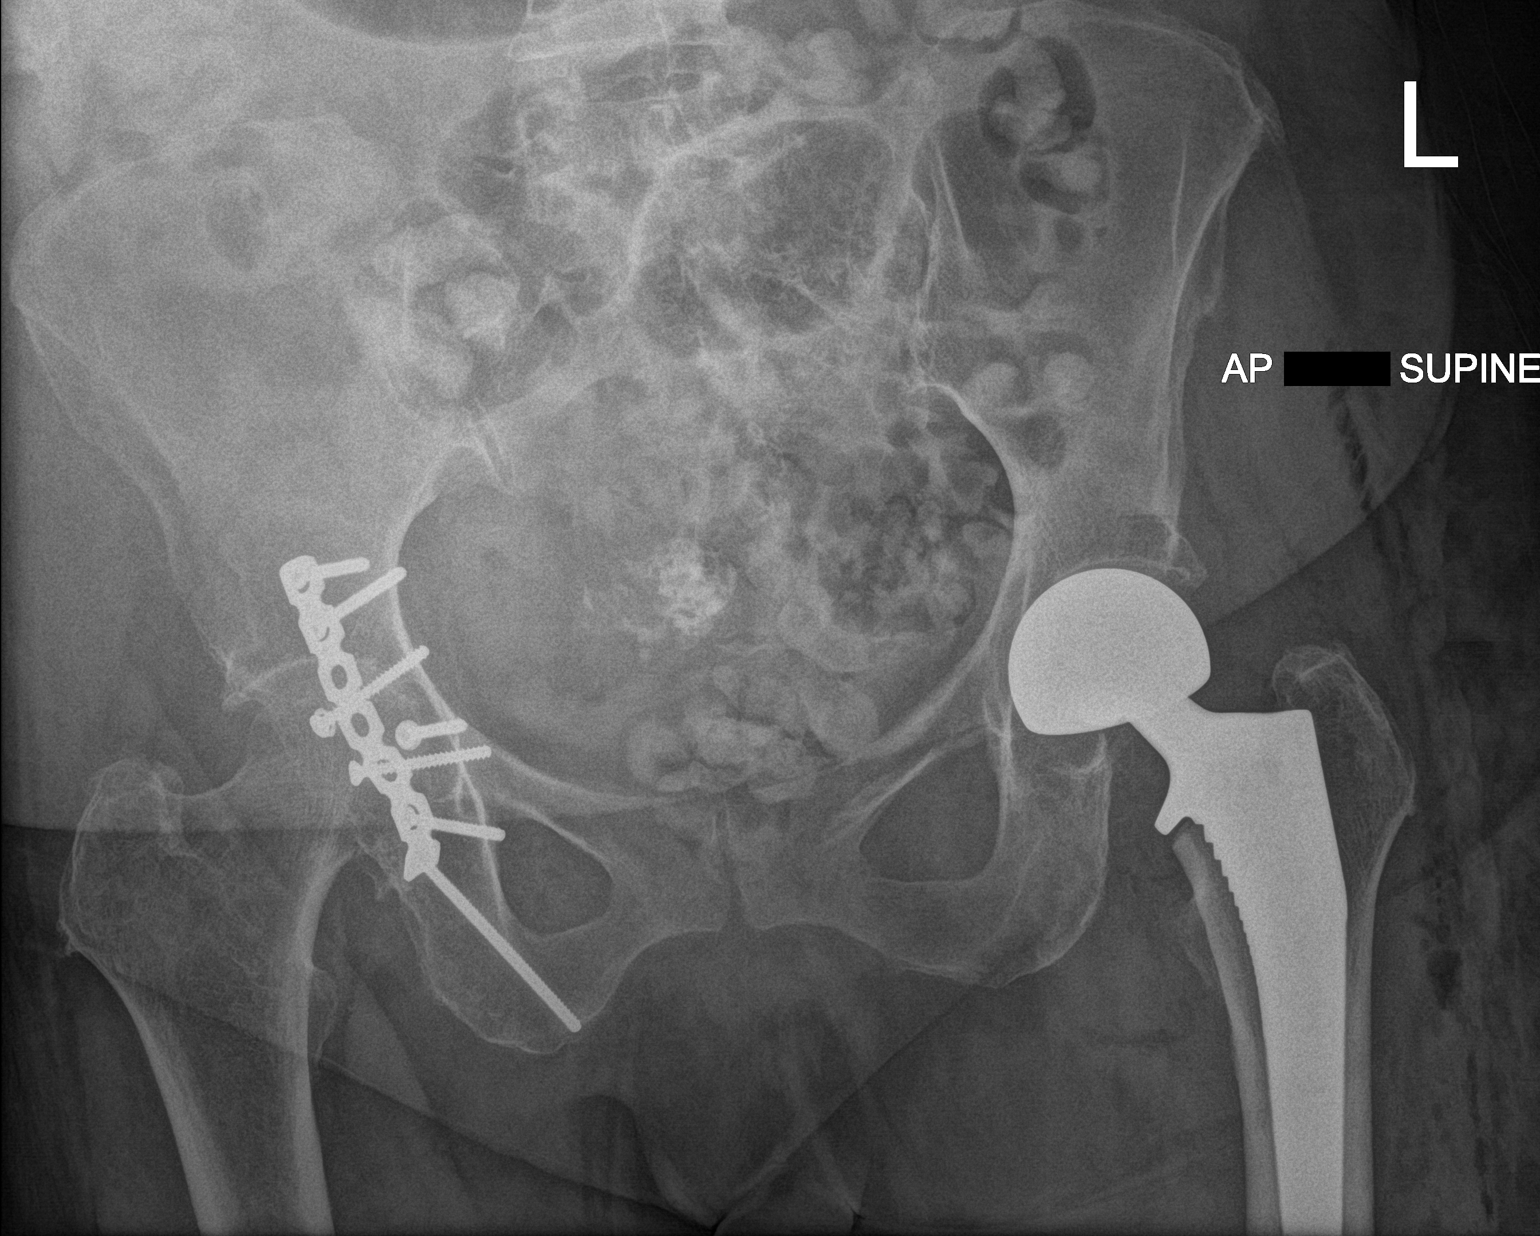

[1 of 1 positions shown; findings below may reference images not displayed]

FINDINGS: The patient is status post left hip replacement. Hardware is in good
position. No other acute abnormalities.
IMPRESSION: Left hip replacement as above.

## 2019-06-11 MED ORDER — HEPARIN SODIUM (PORCINE) 1000 UNIT/ML IJ SOLN
INTRAMUSCULAR | Status: AC
  Filled 2019-06-11: qty 1

## 2019-06-11 MED ORDER — AMLODIPINE BESYLATE 10 MG PO TABS
10.0000 mg | ORAL_TABLET | Freq: Every day | ORAL | Status: DC
  Administered 2019-06-11 – 2019-06-16 (×6): 10 mg via ORAL
  Filled 2019-06-11 (×7): qty 1

## 2019-06-11 MED ORDER — ONDANSETRON HCL 4 MG/2ML IJ SOLN
4.0000 mg | Freq: Four times a day (QID) | INTRAMUSCULAR | Status: DC | PRN
  Administered 2019-06-12: 4 mg via INTRAVENOUS
  Filled 2019-06-11: qty 2

## 2019-06-11 MED ORDER — ENOXAPARIN SODIUM 30 MG/0.3ML ~~LOC~~ SOLN
30.0000 mg | SUBCUTANEOUS | Status: DC
  Administered 2019-06-13 – 2019-06-15 (×3): 30 mg via SUBCUTANEOUS
  Filled 2019-06-11 (×5): qty 0.3

## 2019-06-11 MED ORDER — ACETAMINOPHEN 500 MG PO TABS
1000.0000 mg | ORAL_TABLET | Freq: Once | ORAL | Status: AC
  Administered 2019-06-11: 1000 mg via ORAL
  Filled 2019-06-11: qty 2

## 2019-06-11 MED ORDER — HYDROCODONE-ACETAMINOPHEN 5-325 MG PO TABS
1.0000 | ORAL_TABLET | Freq: Three times a day (TID) | ORAL | Status: DC | PRN
  Administered 2019-06-11 – 2019-06-16 (×7): 1 via ORAL
  Filled 2019-06-11 (×8): qty 1

## 2019-06-11 MED ORDER — MORPHINE SULFATE (PF) 2 MG/ML IV SOLN: 1.0000 mg | Freq: Three times a day (TID) | INTRAVENOUS | Status: DC | PRN

## 2019-06-11 NOTE — Progress Notes (Signed)
PT Progress Note for Charges    06/11/19 1341  PT Visit Information  Last PT Received On 06/11/19  PT General Charges  $$ ACUTE PT VISIT 1 Visit  PT Evaluation  $PT Eval Moderate Complexity 1 Mod  PT Treatments  $Therapeutic Activity 8-22 mins  Anastasio Champion, DPT  Acute Rehabilitation Services Pager [phone] Office [phone]

## 2019-06-11 NOTE — Progress Notes (Signed)
Subjective:  Awoken from  Sleep ,cos of some hip discomfort ,meds helping   Objective Vital signs in last 24 hours: Vitals:   06/10/19 2009 06/11/19 0413 06/11/19 0417 06/11/19 0957  BP: (!) 179/105 126/87 (!) 141/73 (!) 147/93  Pulse: 64 68 89 68  Resp: 20 17 20 20   Temp: 98.6 F (37 C) 98.6 F (37 C) 99 F (37.2 C) 98.5 F (36.9 C)  TempSrc: Oral Oral Oral Oral  SpO2: (!) 84% 100% 100% 97%  Weight:      Height:       Weight change: 0 kg  Physical Exam: General Eldely  AAF :alert ,after awoken, nad Lungs: CTA bilaterally Heart:RRR, no rub  Abdomen: soft NT / ND  Lower extremities: No LE edema Dialysis Access:LUE AVF +bruit; Temp cath right neck   Dialysis: [facility] MWF 4h 350/800 EDW 77kg 2K/2.25Ca UFP 4 AVF Heparin 2000 Mircera 200 (last 3/31)  Problem/Plan:: 1. Confusion/AMS on 4/7. Concern for seizure/uremia. Admit BUN 120/Cr 14.5 - EEG nonspecific. Improved. MS AND LABS improved  BUN 58  Cr 9.07  2. Left femoral neck fracture s/p syncopal event at home. [name] surgery 06/10/19  3. Hyperkalemia -Resolved with HD. 4. ESRD -HD MWF. Missed HD Monday d/t difficult cannulation of AVF.Unable to cannulate fistulaTuesday. Appreciate IR placement of temp cath4/7.Had HD 4/7.High BUN120/ creat.14.5  HD again  4/8. Improved BUN 58 Scr 9.07  HD Sat today  -then resume MWF next week. 5. AVF malfunction - ongoing difficulty with cannulation. VVS consulted- will need fistulogram when stable. 6. Hypertension/volume - Hypertensive on arrival. Now controlled. On amlodipine 5, labetalol 200 bid. 7. Anemia - Hgb11.9 No ESA needs. 8. Metabolic bone disease - Continue Auryixa binders/VDRA/1st= phos 9.1  Fu after binders in hosp . 9. Nutrition - Renal diet/fluid restriction when eating.  Ernest Haber, PA-C [facility]per [phone] 06/11/2019,12:11 PM  LOS: 4 days   Labs: Basic Metabolic Panel: Recent Labs  Lab 06/08/19 1551  06/09/19 0336 06/11/19 0316  NA 135 137 133*  K 5.4* 4.1 4.4  CL 94* 92* 91*  CO2 14* 21* 23  GLUCOSE 144* 143* 132*  BUN 44* 58* 58*  CREATININE 7.63* 9.07* 7.63*  CALCIUM 8.8* 8.6* 8.3*  PHOS  --   --  9.1*   Liver Function Tests: Recent Labs  Lab 06/07/19 1116 06/08/19 1551  AST 29 55*  ALT <5 <5  ALKPHOS 84 77  BILITOT 0.9 1.1  PROT 7.9 8.0  ALBUMIN 3.4* 3.5   Recent Labs  Lab 06/07/19 1116  LIPASE 29   Recent Labs  Lab 06/08/19 1551  AMMONIA 37*   CBC: Recent Labs  Lab 06/07/19 1116 06/07/19 1116 06/08/19 0520 06/08/19 0520 06/08/19 1915 06/09/19 0336 06/11/19 0316  WBC 16.1*   < > 11.2*   < > 12.0* 10.8* 9.5  NEUTROABS 14.3*   < >  --   --  9.9* 8.6* 7.0  HGB 12.5   < > 11.9*   < > 13.2 11.9* 10.1*  HCT 40.8   < > 38.0   < > 41.0 36.7 31.9*  MCV 103.6*  --  101.1*  --  97.9 97.9 98.5  PLT 286   < > 309   < > 290 283 195   < > = values in this interval not displayed.   Cardiac Enzymes: No results for input(s): CKTOTAL, CKMB, CKMBINDEX, TROPONINI in the last 168 hours. CBG: Recent Labs  Lab 06/08/19 1522 06/09/19 0749 06/10/19  0104 06/10/19 1724  GLUCAP 168* 143* 272* 116*    Studies/Results: DG C-Arm 1-60 Min  Result Date: 06/10/2019 CLINICAL DATA:  Left hip replacement EXAM: OPERATIVE LEFT HIP (WITH PELVIS IF PERFORMED) 4 VIEWS TECHNIQUE: Fluoroscopic spot image(s) were submitted for interpretation post-operatively. COMPARISON:  06/07/2019 FINDINGS: Intraoperative spot images demonstrate changes of left hip replacement. Normal AP alignment. No hardware or bony complicating feature. IMPRESSION: Left hip replacement.  No visible complicating feature. Electronically Signed   By: Rolm Baptise M.D.   On: 06/10/2019 18:47   DG HIP OPERATIVE UNILAT W OR W/O PELVIS LEFT  Result Date: 06/10/2019 CLINICAL DATA:  Left hip replacement EXAM: OPERATIVE LEFT HIP (WITH PELVIS IF PERFORMED) 4 VIEWS TECHNIQUE: Fluoroscopic spot image(s) were submitted for  interpretation post-operatively. COMPARISON:  06/07/2019 FINDINGS: Intraoperative spot images demonstrate changes of left hip replacement. Normal AP alignment. No hardware or bony complicating feature. IMPRESSION: Left hip replacement.  No visible complicating feature. Electronically Signed   By: Rolm Baptise M.D.   On: 06/10/2019 18:47   Medications: .  ceFAZolin (ANCEF) IV     . albuterol  2.5 mg Nebulization Once  . amLODipine  10 mg Oral Daily  . atorvastatin  10 mg Oral QHS  . Chlorhexidine Gluconate Cloth  6 each Topical Q0600  . Chlorhexidine Gluconate Cloth  6 each Topical Q0600  . docusate sodium  100 mg Oral BID  . enoxaparin (LOVENOX) injection  30 mg Subcutaneous Q24H  . escitalopram  10 mg Oral Daily  . ferric citrate  420 mg Oral TID WC  . heparin  2,000 Units Dialysis Once in dialysis  . labetalol  200 mg Oral BID  . sodium zirconium cyclosilicate  10 g Oral Once

## 2019-06-11 NOTE — Progress Notes (Signed)
Patient has taken off sterile dressing over surgical incision on her left thigh stating that she don't want the dressing. Upon assessment surgical site was clean and dry. This nurse placed abd pad and tape dressing on surgical site. Will continue to monitor.   Adalis Gatti,RN.

## 2019-06-11 NOTE — Progress Notes (Signed)
Patient ID: Bianca Myers, female   DOB: 27-Apr-1945, 74 y.o.   MRN: [id number]  PROGRESS NOTE    Carolena Fairbank  XHB:[id number] DOB: 07/21/1945 DOA: 06/07/2019 PCP: [name], MD   Brief Narrative:  74 year old female with history of end-stage renal disease on hemodialysis, diabetes mellitus type 2, hypertension, DJD, MSSA bacteremia in January 2021 presented on 06/07/2019 after a fall.  Apparently, she went to her dialysis center and as per schedule on 06/06/2019 but could not get her dialysis done due to issues with her AV fistula, subsequently went back home and then subsequently fell down, unclear if she had a syncopal event.  In the ED, patient was confused with creatinine of 13, potassium of 6.9 and bicarb of 14.  Nephrology was consulted.  She was also found to have left hip fracture for which orthopedics was consulted  Assessment & Plan:  Left hip fracture status post fall - she is s/p left hip fracture surgical repair on 06/10/2019, weightbearing as tolerated, Lovenox for DVT prophylaxis, commence PT OT, may require placement at SNF.   Hyperkalemia, End-stage renal disease on hemodialysis, Uremia with Aacute metabolic encephalopathy to missed dialysis and malfunctioning of her dialysis fistula. She has had temporary right IJ dialysis catheter placed this admission and dialysis has been started, now back on MWF schedule.  Nephrology following.  Mental status improving.  Leukocytosis - Probably reactive.  Improving  Mildly elevated high-sensitivity troponin - No chest pain. Likely secondary to ESRD.  No further work-up needed.  Hypertension - will place on Norvasc and monitor.  Anemia of chronic disease with mild perioperative blood loss related anemia.  She is Jehovah's Witness.  Monitor on oral iron currently no need for transfusion.  Generalized deconditioning - We will need PT evaluation and may need rehab after left hip surgery. SNF ?.    DVT prophylaxis:  Heparin>>Lovenox  Code Status: Full  Family Communication: Son [place] on 06/11/2019 at [phone] - message left at 11:44 AM. . Disposition Plan: SNF after bed is available, recent left hip fracture surgical repair.  Likely discharge 06/12/2019 if bed available.  Consultants: Orthopedic/nephrology/vascular surgery  Procedures:   IR guided TDC R.IJ HD catheter placement on 06/08/2019  Left hip fracture surgical correction on 06/10/2019 by Dr. Erlinda Hong  Antimicrobials:  None   Subjective:  Patient in bed, appears comfortable, denies any headache, no fever, no chest pain or pressure, no shortness of breath , no abdominal pain. No focal weakness.  Objective: Vitals:   06/10/19 2009 06/11/19 0413 06/11/19 0417 06/11/19 0957  BP: (!) 179/105 126/87 (!) 141/73 (!) 147/93  Pulse: 64 68 89 68  Resp: 20 17 20 20   Temp: 98.6 F (37 C) 98.6 F (37 C) 99 F (37.2 C) 98.5 F (36.9 C)  TempSrc: Oral Oral Oral Oral  SpO2: (!) 84% 100% 100% 97%  Weight:      Height:        Intake/Output Summary (Last 24 hours) at 06/11/2019 1139 Last data filed at 06/11/2019 0845 Gross per 24 hour  Intake 670 ml  Output 100 ml  Net 570 ml   Filed Weights   06/09/19 1715 06/10/19 1441  Weight: 77 kg 77 kg    Examination:  Awake, mildly confused, No new F.N deficits,   [place].AT,PERRAL Supple Neck,No JVD, No cervical lymphadenopathy appriciated.  Symmetrical Chest wall movement, Good air movement bilaterally, CTAB RRR,No Gallops, Rubs or new Murmurs, No Parasternal Heave +ve B.Sounds, Abd Soft, No tenderness, No organomegaly appriciated, No  rebound - guarding or rigidity. No Cyanosis, left hip postop site stable under bandage, right IJ temporary catheter.   Data Reviewed: I have personally reviewed following labs and imaging studies  CBC: Recent Labs  Lab 06/07/19 1116 06/08/19 0520 06/08/19 1915 06/09/19 0336 06/11/19 0316  WBC 16.1* 11.2* 12.0* 10.8* 9.5  NEUTROABS 14.3*  --  9.9* 8.6* 7.0   HGB 12.5 11.9* 13.2 11.9* 10.1*  HCT 40.8 38.0 41.0 36.7 31.9*  MCV 103.6* 101.1* 97.9 97.9 98.5  PLT 286 309 290 283 831   Basic Metabolic Panel: Recent Labs  Lab 06/07/19 1116 06/07/19 1725 06/08/19 0520 06/08/19 1551 06/09/19 0336 06/11/19 0316  NA 134*  --  131* 135 137 133*  K 6.9*  --  7.0* 5.4* 4.1 4.4  CL 96*  --  93* 94* 92* 91*  CO2 14*  --  12* 14* 21* 23  GLUCOSE 129*  --  169* 144* 143* 132*  BUN 100*  --  120* 44* 58* 58*  CREATININE 13.10*  --  14.25* 7.63* 9.07* 7.63*  CALCIUM 9.2  --  9.2 8.8* 8.6* 8.3*  MG  --  2.2  --   --   --  2.0  PHOS  --   --   --   --   --  9.1*   GFR: Estimated Creatinine Clearance: 7 mL/min (A) (by C-G formula based on SCr of 7.63 mg/dL (H)). Liver Function Tests: Recent Labs  Lab 06/07/19 1116 06/08/19 1551  AST 29 55*  ALT <5 <5  ALKPHOS 84 77  BILITOT 0.9 1.1  PROT 7.9 8.0  ALBUMIN 3.4* 3.5   Recent Labs  Lab 06/07/19 1116  LIPASE 29   Recent Labs  Lab 06/08/19 1551  AMMONIA 37*   Coagulation Profile: Recent Labs  Lab 06/08/19 0603  INR 1.3*   Cardiac Enzymes: No results for input(s): CKTOTAL, CKMB, CKMBINDEX, TROPONINI in the last 168 hours. BNP (last 3 results) No results for input(s): PROBNP in the last 8760 hours. HbA1C: No results for input(s): HGBA1C in the last 72 hours. CBG: Recent Labs  Lab 06/08/19 1522 06/09/19 0749 06/10/19 0104 06/10/19 1724  GLUCAP 168* 143* 272* 116*   Lipid Profile: No results for input(s): CHOL, HDL, LDLCALC, TRIG, CHOLHDL, LDLDIRECT in the last 72 hours. Thyroid Function Tests: No results for input(s): TSH, T4TOTAL, FREET4, T3FREE, THYROIDAB in the last 72 hours. Anemia Panel: No results for input(s): VITAMINB12, FOLATE, FERRITIN, TIBC, IRON, RETICCTPCT in the last 72 hours. Sepsis Labs: Recent Labs  Lab 06/07/19 1149 06/07/19 1309  LATICACIDVEN 1.2 1.2    Recent Results (from the past 240 hour(s))  Respiratory Panel by RT PCR (Flu A&B, Covid) -  Nasopharyngeal Swab     Status: None   Collection Time: 06/07/19  1:03 PM   Specimen: Nasopharyngeal Swab  Result Value Ref Range Status   SARS Coronavirus 2 by RT PCR NEGATIVE NEGATIVE Final    Comment: (NOTE) SARS-CoV-2 target nucleic acids are NOT DETECTED. The SARS-CoV-2 RNA is generally detectable in upper respiratoy specimens during the acute phase of infection. The lowest concentration of SARS-CoV-2 viral copies this assay can detect is 131 copies/mL. A negative result does not preclude SARS-Cov-2 infection and should not be used as the sole basis for treatment or other patient management decisions. A negative result may occur with  improper specimen collection/handling, submission of specimen other than nasopharyngeal swab, presence of viral mutation(s) within the areas targeted by this assay, and inadequate number  of viral copies (<131 copies/mL). A negative result must be combined with clinical observations, patient history, and epidemiological information. The expected result is Negative. Fact Sheet for Patients:  PinkCheek.be Fact Sheet for Healthcare Providers:  GravelBags.it This test is not yet ap proved or cleared by the [place] FDA and  has been authorized for detection and/or diagnosis of SARS-CoV-2 by FDA under an Emergency Use Authorization (EUA). This EUA will remain  in effect (meaning this test can be used) for the duration of the COVID-19 declaration under Section 564(b)(1) of the Act, 21 U.S.C. section 360bbb-3(b)(1), unless the authorization is terminated or revoked sooner.    Influenza A by PCR NEGATIVE NEGATIVE Final   Influenza B by PCR NEGATIVE NEGATIVE Final    Comment: (NOTE) The Xpert Xpress SARS-CoV-2/FLU/RSV assay is intended as an aid in  the diagnosis of influenza from Nasopharyngeal swab specimens and  should not be used as a sole basis for treatment. Nasal washings and  aspirates  are unacceptable for Xpert Xpress SARS-CoV-2/FLU/RSV  testing. Fact Sheet for Patients: PinkCheek.be Fact Sheet for Healthcare Providers: GravelBags.it This test is not yet approved or cleared by the [place] FDA and  has been authorized for detection and/or diagnosis of SARS-CoV-2 by  FDA under an Emergency Use Authorization (EUA). This EUA will remain  in effect (meaning this test can be used) for the duration of the  Covid-19 declaration under Section 564(b)(1) of the Act, 21  U.S.C. section 360bbb-3(b)(1), unless the authorization is  terminated or revoked. Performed at [facility], [place] [address], [place], [place] [address]   Culture, blood (routine x 2)     Status: None (Preliminary result)   Collection Time: 06/07/19  6:48 PM   Specimen: BLOOD  Result Value Ref Range Status   Specimen Description BLOOD RIGHT ANTECUBITAL  Final   Special Requests   Final    BOTTLES DRAWN AEROBIC AND ANAEROBIC Blood Culture adequate volume   Culture   Final    NO GROWTH 4 DAYS Performed at [facility], [place] [address], [place], [place] [address]    Report Status PENDING  Incomplete  Culture, blood (routine x 2)     Status: None (Preliminary result)   Collection Time: 06/07/19 11:04 PM   Specimen: BLOOD  Result Value Ref Range Status   Specimen Description BLOOD RIGHT ANTECUBITAL  Final   Special Requests   Final    BOTTLES DRAWN AEROBIC AND ANAEROBIC Blood Culture adequate volume   Culture   Final    NO GROWTH 3 DAYS Performed at [facility], [place] [address], [place], [place] [address]    Report Status PENDING  Incomplete  Surgical pcr screen     Status: None   Collection Time: 06/09/19  6:43 AM   Specimen: Nasal Mucosa; Nasal Swab  Result Value Ref Range Status   MRSA, PCR NEGATIVE NEGATIVE Final   Staphylococcus aureus NEGATIVE NEGATIVE Final    Comment: (NOTE) The Xpert SA Assay (FDA approved for  NASAL specimens in patients 29 years of age and older), is one component of a comprehensive surveillance program. It is not intended to diagnose infection nor to guide or monitor treatment. Performed at [facility], [place] [address] [place], [place] [address]      Radiology Studies: DG C-Arm 1-60 Min  Result Date: 06/10/2019 CLINICAL DATA:  Left hip replacement EXAM: OPERATIVE LEFT HIP (WITH PELVIS IF PERFORMED) 4 VIEWS TECHNIQUE: Fluoroscopic spot image(s)  were submitted for interpretation post-operatively. COMPARISON:  06/07/2019 FINDINGS: Intraoperative spot images demonstrate changes of left hip replacement. Normal AP alignment. No hardware or bony complicating feature. IMPRESSION: Left hip replacement.  No visible complicating feature. Electronically Signed   By: Rolm Baptise M.D.   On: 06/10/2019 18:47   DG HIP OPERATIVE UNILAT W OR W/O PELVIS LEFT  Result Date: 06/10/2019 CLINICAL DATA:  Left hip replacement EXAM: OPERATIVE LEFT HIP (WITH PELVIS IF PERFORMED) 4 VIEWS TECHNIQUE: Fluoroscopic spot image(s) were submitted for interpretation post-operatively. COMPARISON:  06/07/2019 FINDINGS: Intraoperative spot images demonstrate changes of left hip replacement. Normal AP alignment. No hardware or bony complicating feature. IMPRESSION: Left hip replacement.  No visible complicating feature. Electronically Signed   By: Rolm Baptise M.D.   On: 06/10/2019 18:47   Scheduled Meds: . albuterol  2.5 mg Nebulization Once  . atorvastatin  10 mg Oral QHS  . Chlorhexidine Gluconate Cloth  6 each Topical Q0600  . Chlorhexidine Gluconate Cloth  6 each Topical Q0600  . docusate sodium  100 mg Oral BID  . escitalopram  10 mg Oral Daily  . ferric citrate  420 mg Oral TID WC  . heparin  2,000 Units Dialysis Once in dialysis  . heparin injection (subcutaneous)  5,000 Units Subcutaneous Q8H  . labetalol  200 mg Oral BID  . sodium zirconium cyclosilicate  10 g Oral Once   Continuous Infusions: .   ceFAZolin (ANCEF) IV      Total time spent: 30 minutes  Signature  Lala Lund M.D on 06/11/2019 at 11:40 AM   -  To page go to [email or web address]

## 2019-06-11 NOTE — Evaluation (Signed)
Physical Therapy Evaluation Patient Details Name: Bianca Myers MRN: [id number] DOB: [birth year] Today's Date: 06/11/2019   History of Present Illness  Bianca Myers is a 74 y.o. female with medical history significant for ESRD on hemodialysis, type 2 diabetes mellitus, hypertension, DJD, history of MSSA bacteremia who presemts to the emergency room after a fall, resulting in a L hip fracture and found to have acute metabolic encephelopathy. S/p anterior hip hemiarthroplasty 06/10/19    Clinical Impression  Patient presented sleeping but easily awoken, in bed, and willing to participate in therapy. Pt with impaired cognition therefore spoke to son Brenton Grills to gather info about PLOF. He states that her impaired cognition is not her baseline- it only occurs when she does not receive dialysis consistently which has been on ongoing issue. PTA, pt was independent in all ADL's, she used a cane for community ambulation, and recently stopped driving herself. Her son has been living with her for 1 year in her one story home with 2 steps to enter. At the time of evaluation, she demonstrated difficulties with orientation to situation, short-term memory, and following commands. She was maxA +2 for all bed mobility. She required maxA + 2 to stand initially, she was able to assist with power up using B UE's on bed. She was then max-totalA when attempting to use Stedy as she had difficulties sequencing. Hopeful to progress mobility and activity tolerance with improved cognition and pain. Recommend d/c to SNF for further therapy prior to returning home with son- son is in agreement. Pt would continue to benefit from skilled physical therapy services at this time while admitted and after d/c to address the below listed limitations in order to improve overall safety and independence with functional mobility.       Follow Up Recommendations SNF    Equipment Recommendations  Other (comment)(defer to next venue of  care)    Recommendations for Other Services       Precautions / Restrictions Precautions Precautions: Fall Restrictions Weight Bearing Restrictions: Yes LLE Weight Bearing: Weight bearing as tolerated      Mobility  Bed Mobility Overal bed mobility: Needs Assistance Bed Mobility: Supine to Sit;Sit to Supine     Supine to sit: Max assist;+2 for physical assistance Sit to supine: Max assist;+2 for physical assistance   General bed mobility comments: maxA + 2 to sit EOB and to go sit to supine, assistance with sequencing and managing B LE's and trunk  Transfers Overall transfer level: Needs assistance Equipment used: None;Ambulation equipment used Transfers: Sit to/from Stand Sit to Stand: Max assist;+2 physical assistance;+2 safety/equipment;From elevated surface         General transfer comment: MaxA + 2 for intial sit<>stand, pt able to assist via pushing up with UE's. Attempted sit<>stand w/Stedy, max-totalA +2 as pt had difficulty sequencing and pulling up with equipment. Pt able to bear fair amount of weight through L LE  Ambulation/Gait                Stairs            Wheelchair Mobility    Modified Rankin (Stroke Patients Only)       Balance Overall balance assessment: Needs assistance Sitting-balance support: Bilateral upper extremity supported;Feet supported Sitting balance-Leahy Scale: Poor Sitting balance - Comments: Occasional moments of good sitting balance, fatigues quickly and tends to lean posteriorly  Postural control: Posterior lean Standing balance support: No upper extremity supported;Bilateral upper extremity supported;During functional activity Standing balance-Leahy Scale: Zero Standing balance  comment: MaxA +2 to maintain standing balance. Only able to stand for ~5sec then requires rest                             Pertinent Vitals/Pain Pain Assessment: Faces Faces Pain Scale: Hurts even more Pain Location: L  hip Pain Descriptors / Indicators: Discomfort;Grimacing;Guarding Pain Intervention(s): Limited activity within patient's tolerance;Monitored during session    Home Living Family/patient expects to be discharged to:: Private residence Living Arrangements: Children Available Help at Discharge: Family;Available PRN/intermittently Type of Home: House Home Access: Stairs to enter Entrance Stairs-Rails: None Entrance Stairs-Number of Steps: 2 Home Layout: One level Home Equipment: Walker - 2 wheels;Cane - single point;Bedside commode;Shower seat      Prior Function Level of Independence: Independent         Comments: Community ambulator w/cane. No longer drives. Ind. w/all other ADL's     Hand Dominance   Dominant Hand: Right    Extremity/Trunk Assessment   Upper Extremity Assessment Upper Extremity Assessment: Defer to OT evaluation;Overall [facility] for tasks assessed    Lower Extremity Assessment Lower Extremity Assessment: Difficult to assess due to impaired cognition;Generalized weakness       Communication   Communication: Receptive difficulties;Expressive difficulties  Cognition Arousal/Alertness: Awake/alert Behavior During Therapy: WFL for tasks assessed/performed Overall Cognitive Status: Impaired/Different from baseline Area of Impairment: Attention;Memory;Following commands;Safety/judgement;Awareness                   Current Attention Level: Selective Memory: Decreased short-term memory Following Commands: Follows one step commands inconsistently;Follows multi-step commands inconsistently Safety/Judgement: Decreased awareness of safety;Decreased awareness of deficits Awareness: Emergent   General Comments: oriented to self and situation- could not recall name of hospital. Short-term memory impaired. Pleasant and willing to participate- requires inc time and simple commands      General Comments      Exercises     Assessment/Plan    PT  Assessment Patient needs continued PT services  PT Problem List Decreased strength;Decreased range of motion;Decreased activity tolerance;Decreased balance;Decreased mobility;Decreased coordination;Decreased cognition;Decreased knowledge of use of DME;Decreased safety awareness;Decreased knowledge of precautions       PT Treatment Interventions DME instruction;Gait training;Stair training;Functional mobility training;Therapeutic activities;Therapeutic exercise;Balance training;Cognitive remediation;Patient/family education    PT Goals (Current goals can be found in the Care Plan section)  Acute Rehab PT Goals Patient Stated Goal: to go to the toilet PT Goal Formulation: With patient/family Time For Goal Achievement: 06/25/19 Potential to Achieve Goals: Good    Frequency Min 3X/week   Barriers to discharge        Co-evaluation               AM-PAC PT "6 Clicks" Mobility  Outcome Measure Help needed turning from your back to your side while in a flat bed without using bedrails?: A Little Help needed moving from lying on your back to sitting on the side of a flat bed without using bedrails?: A Lot Help needed moving to and from a bed to a chair (including a wheelchair)?: Total Help needed standing up from a chair using your arms (e.g., wheelchair or bedside chair)?: A Lot Help needed to walk in hospital room?: Total Help needed climbing 3-5 steps with a railing? : Total 6 Click Score: 10    End of Session   Activity Tolerance: Patient limited by pain;Patient limited by fatigue Patient left: in bed;with call bell/phone within reach;with bed alarm set Nurse Communication:  Mobility status PT Visit Diagnosis: Unsteadiness on feet (R26.81);Other abnormalities of gait and mobility (R26.89);Muscle weakness (generalized) (M62.81)    Time: 6154-8845 PT Time Calculation (min) (ACUTE ONLY): 36 min   Charges:   PT Evaluation $PT Eval Moderate Complexity: 1 Mod PT  Treatments $Therapeutic Activity: 8-22 mins       Keaston Pile, SPT Acute Rehab  [phone]  Caylie Sandquist 06/11/2019, 1:44 PM

## 2019-06-11 NOTE — Plan of Care (Signed)
  Problem: Education: Goal: Knowledge of General Education information will improve Description: Including pain rating scale, medication(s)/side effects and non-pharmacologic comfort measures Outcome: Progressing   Problem: Pain Managment: Goal: General experience of comfort will improve Outcome: Progressing   

## 2019-06-11 NOTE — Progress Notes (Signed)
Pharmacy: Renally adjusted Lovenox for Hip Fracture  OBJECTIVE:  Wt: 77 kg   Ht: 5'7"  BMI~26.5 ESRD  ASSESSMENT:  9 YOF with ESRD and s/p L-hip fracture with repair on 4/9. The patient is to be on Lovenox for 21d post-op to help prevent VTE. Pharmacy consulted to help with dosing given the patient's renal impairment.   The dosing recommendations for Lovenox for VTE prophylaxis after this type of ortho procedure is 40 mg SQ once daily or 30 mg SQ bid. Given the patient's ESRD - will renally adjust the 40 mg SQ once daily dosing to 30 mg SQ once daily. Will dose in the evening.    PLAN:  - D/c Hep SQ - Start Lovenox 30 mg SQ daily for 21 days post-op - Pharmacy will sign off of consult as no further dose adjustments expected  Thank you for allowing pharmacy to be a part of this patient's care.  Alycia Rossetti, PharmD, BCPS Clinical Pharmacist Clinical phone for 06/11/2019: [phone] 06/11/2019 11:55 AM   **Pharmacist phone directory can now be found on [email or web address] ([email or web address]).  Listed under [place].

## 2019-06-11 NOTE — Progress Notes (Signed)
   Subjective:  Patient is confused but pleasant.   Took off her surgical dressing last night.  Objective:   VITALS:   Vitals:   06/10/19 2009 06/11/19 0413 06/11/19 0417 06/11/19 0957  BP: (!) 179/105 126/87 (!) 141/73 (!) 147/93  Pulse: 64 68 89 68  Resp: 20 17 20 20   Temp: 98.6 F (37 C) 98.6 F (37 C) 99 F (37.2 C) 98.5 F (36.9 C)  TempSrc: Oral Oral Oral Oral  SpO2: (!) 84% 100% 100% 97%  Weight:      Height:        Dressing c/d/i Thigh soft NVI distally   Lab Results  Component Value Date   WBC 9.5 06/11/2019   HGB 10.1 (L) 06/11/2019   HCT 31.9 (L) 06/11/2019   MCV 98.5 06/11/2019   PLT 195 06/11/2019     Assessment/Plan:  1 Day Post-Op   - Expected postop acute blood loss anemia - will monitor for symptoms - Up with PT/OT - likely need SNF - DVT ppx - SCDs, ambulation, lovenox renally dosed - WBAT operative extremity  Eduard Roux 06/11/2019, 11:14 AM [phone]

## 2019-06-11 NOTE — Progress Notes (Signed)
Patient refused Pelvis xray at this time, stating that she is in severe pain 30 minutes after pain medication was given. Will continue to monitor.   Amritha Yorke,RN.

## 2019-06-12 ENCOUNTER — Inpatient Hospital Stay (HOSPITAL_COMMUNITY): Payer: Medicare HMO

## 2019-06-12 DIAGNOSIS — S72002A Fracture of unspecified part of neck of left femur, initial encounter for closed fracture: Secondary | ICD-10-CM | POA: Diagnosis not present

## 2019-06-12 LAB — CBC
HCT: 31.4 % — ABNORMAL LOW (ref 36.0–46.0)
Hemoglobin: 10.1 g/dL — ABNORMAL LOW (ref 12.0–15.0)
MCH: 31.3 pg (ref 26.0–34.0)
MCHC: 32.2 g/dL (ref 30.0–36.0)
MCV: 97.2 fL (ref 80.0–100.0)
Platelets: 186 10*3/uL (ref 150–400)
RBC: 3.23 MIL/uL — ABNORMAL LOW (ref 3.87–5.11)
RDW: 17.4 % — ABNORMAL HIGH (ref 11.5–15.5)
WBC: 9 10*3/uL (ref 4.0–10.5)
nRBC: 0 % (ref 0.0–0.2)

## 2019-06-12 LAB — BASIC METABOLIC PANEL
Anion gap: 13 (ref 5–15)
BUN: 24 mg/dL — ABNORMAL HIGH (ref 8–23)
CO2: 27 mmol/L (ref 22–32)
Calcium: 8 mg/dL — ABNORMAL LOW (ref 8.9–10.3)
Chloride: 97 mmol/L — ABNORMAL LOW (ref 98–111)
Creatinine, Ser: 4.57 mg/dL — ABNORMAL HIGH (ref 0.44–1.00)
GFR calc Af Amer: 10 mL/min — ABNORMAL LOW (ref >60)
GFR calc non Af Amer: 9 mL/min — ABNORMAL LOW (ref >60)
Glucose, Bld: 119 mg/dL — ABNORMAL HIGH (ref 70–99)
Potassium: 3.4 mmol/L — ABNORMAL LOW (ref 3.5–5.1)
Sodium: 137 mmol/L (ref 135–145)

## 2019-06-12 LAB — CULTURE, BLOOD (ROUTINE X 2)
Culture: NO GROWTH
Special Requests: ADEQUATE

## 2019-06-12 IMAGING — DX DG ABD PORTABLE 1V
2 series · 2 of 2 positions shown · non-contrast
Comparison: CT [DATE] and pelvis [DATE]

CLINICAL DATA: Nausea and vomiting.

EXAM:
PORTABLE ABDOMEN - 1 VIEW

[abdomen kub (1 of 2)]
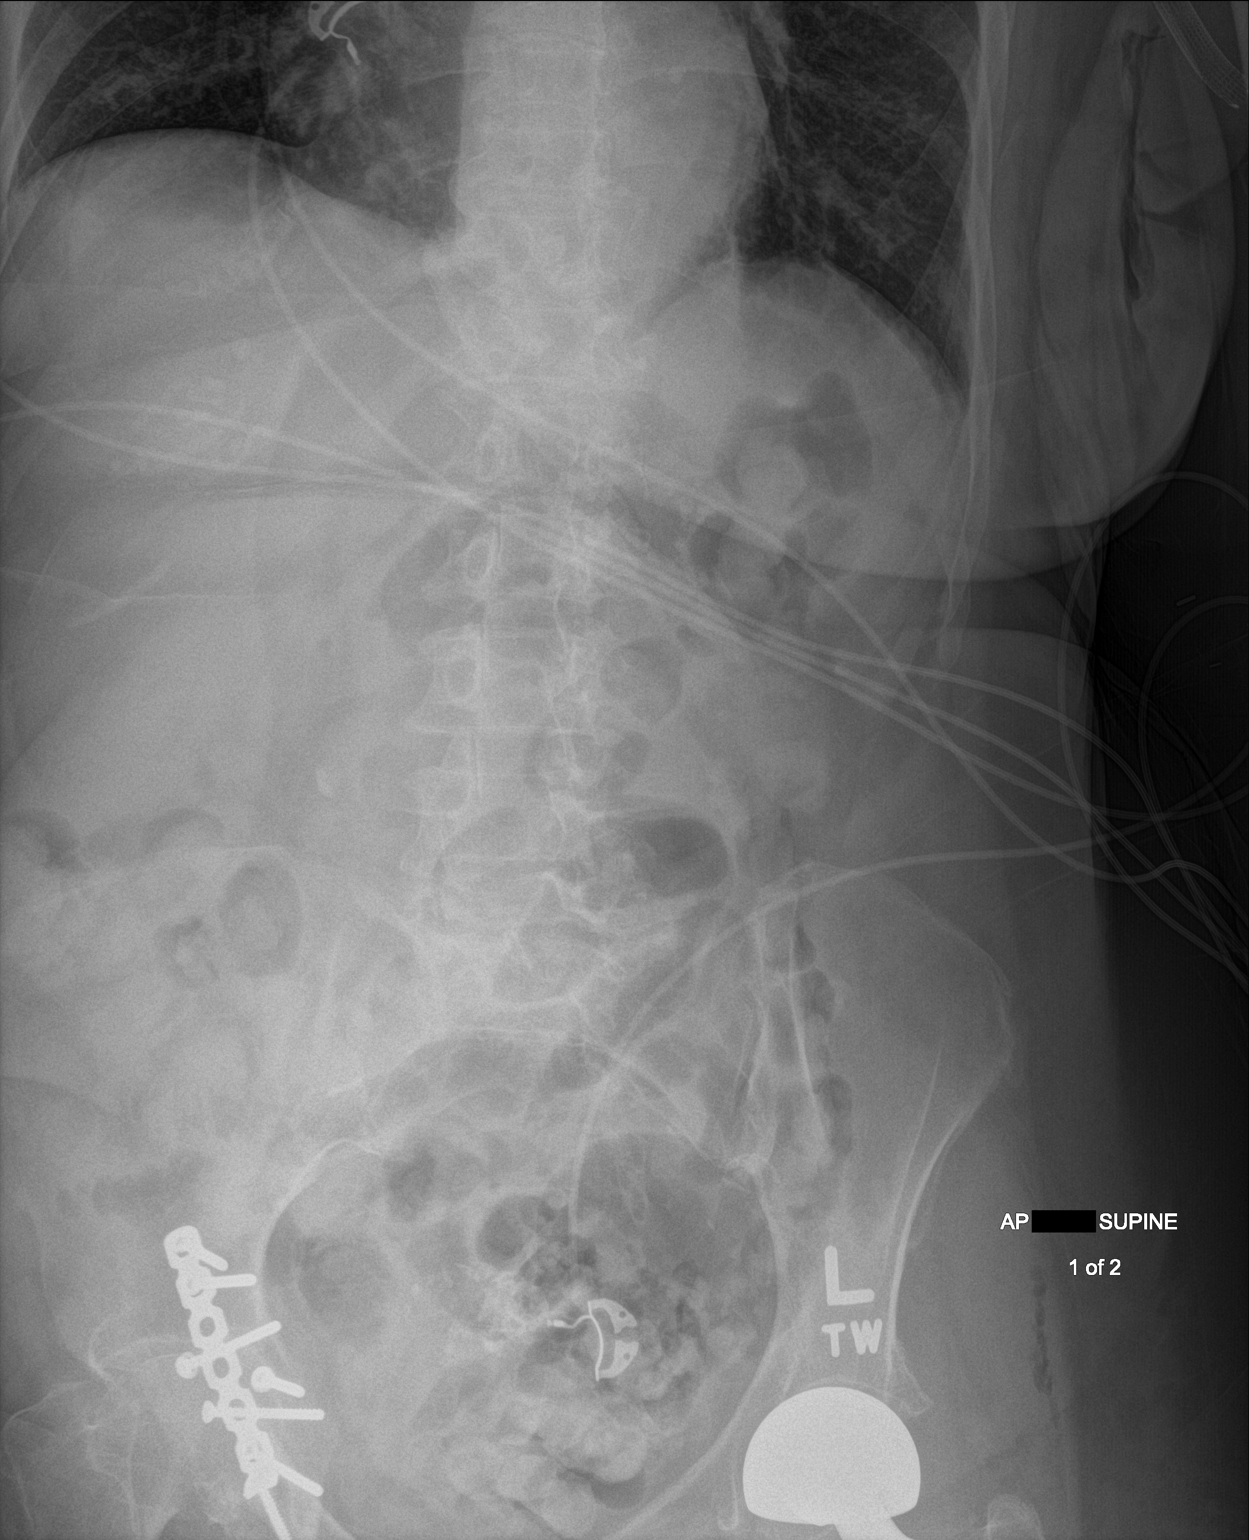

[abdomen kub (2 of 2)]
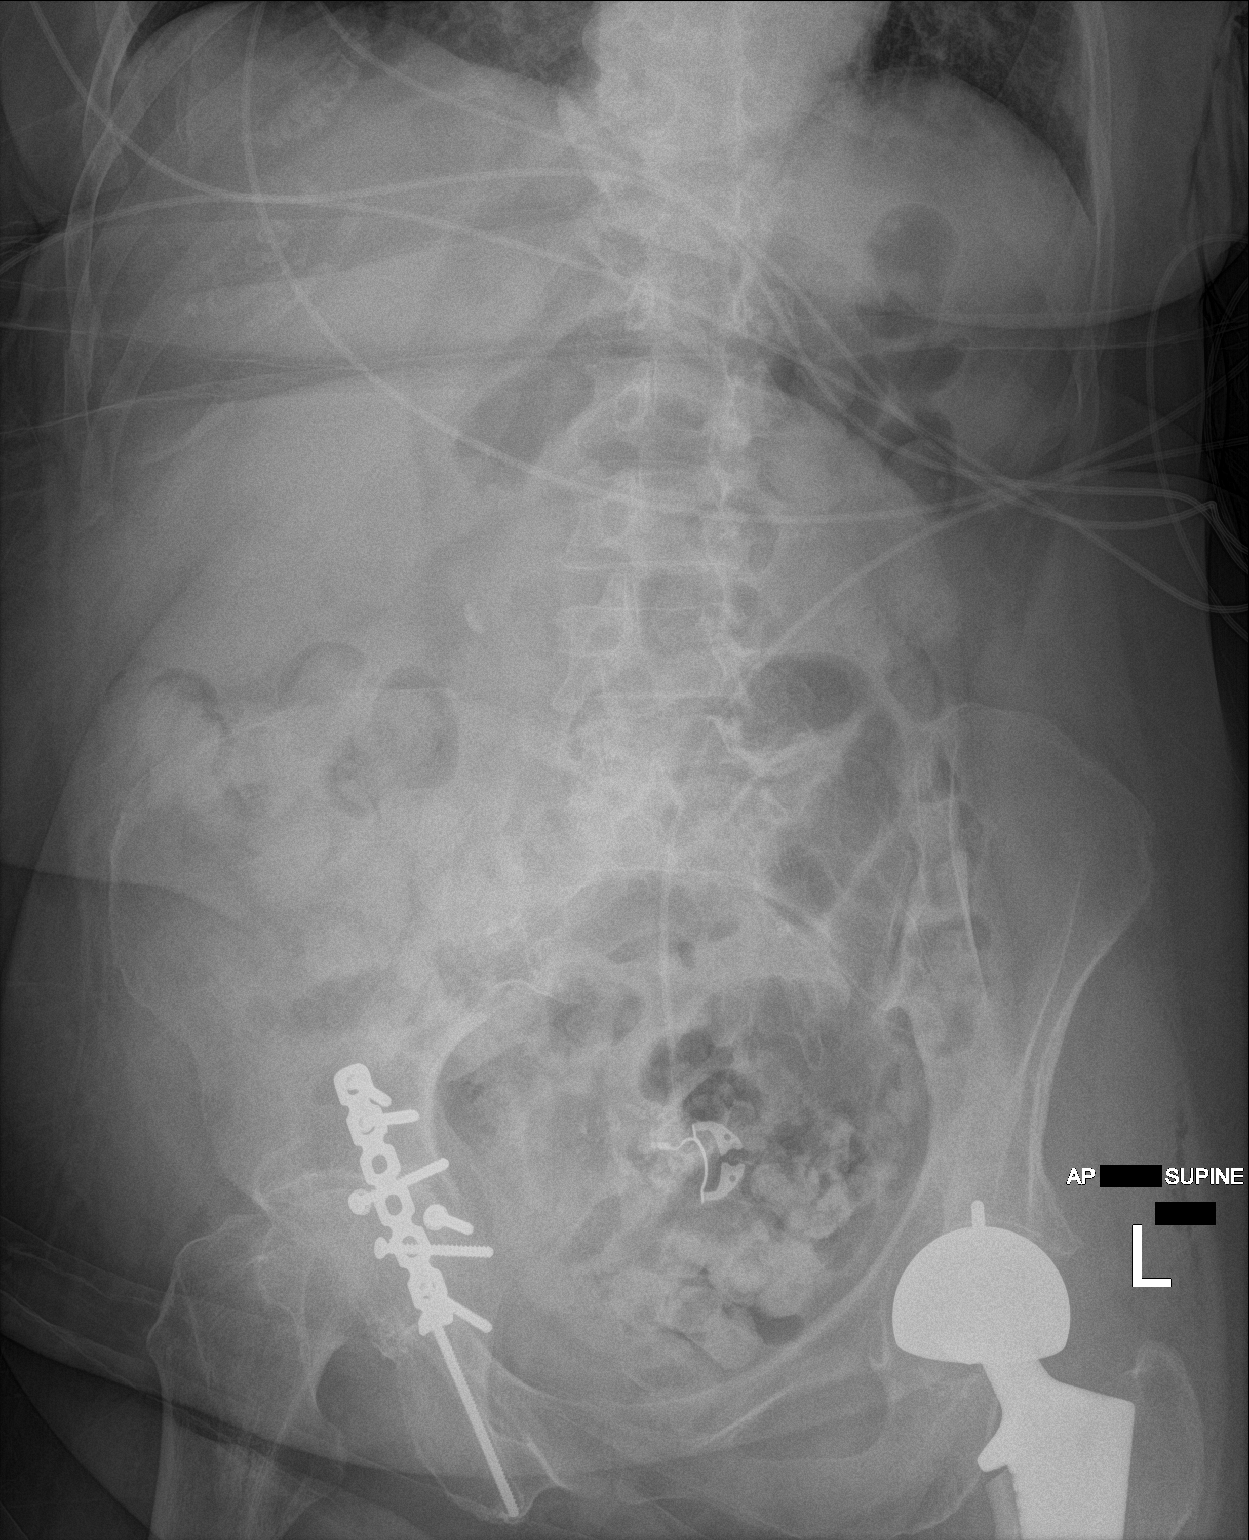

[2 of 2 positions shown; findings below may reference images not displayed]

FINDINGS: Bowel gas pattern is nonobstructive. No free peritoneal air.
Degenerate change of the spine. Fixation hardware over the right
acetabular region intact. Left hip arthroplasty intact.
IMPRESSION: Nonobstructive bowel gas pattern.

## 2019-06-12 IMAGING — DX DG HIP (WITH OR WITHOUT PELVIS) 1V*R*
3 series · 3 of 3 positions shown · non-contrast
Comparison: [DATE], [DATE]

CLINICAL DATA: Altered mental status, fall

EXAM:
DG HIP (WITH OR WITHOUT PELVIS) 1V RIGHT

[hip ap]
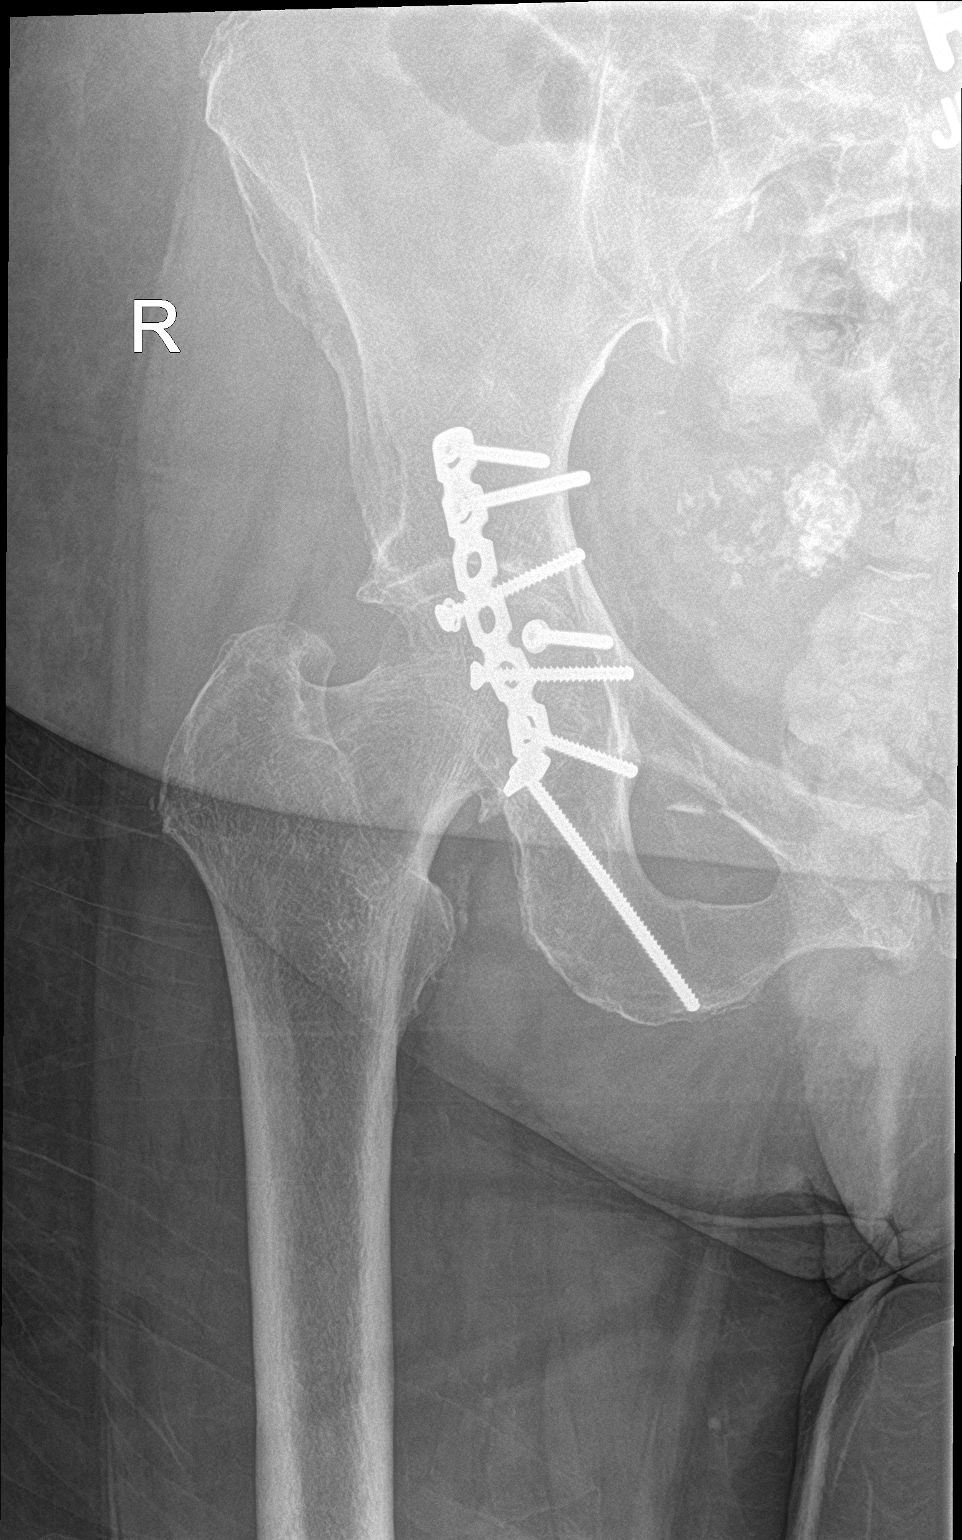

[hip lat]
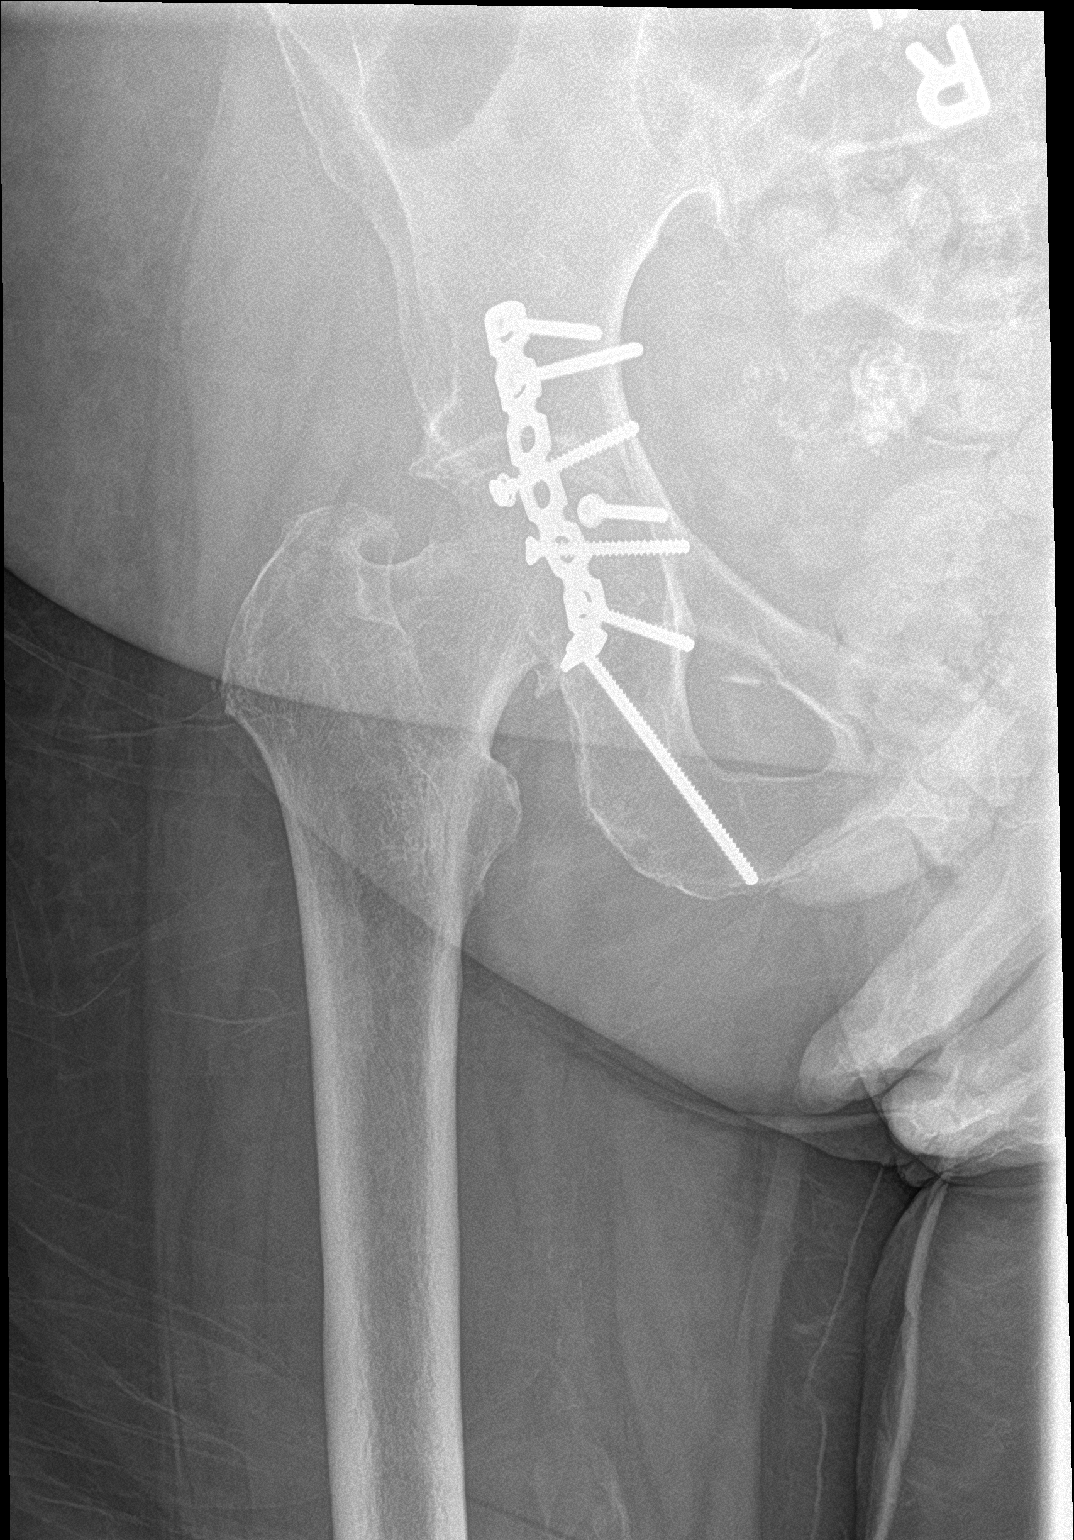

[pelvis ap]
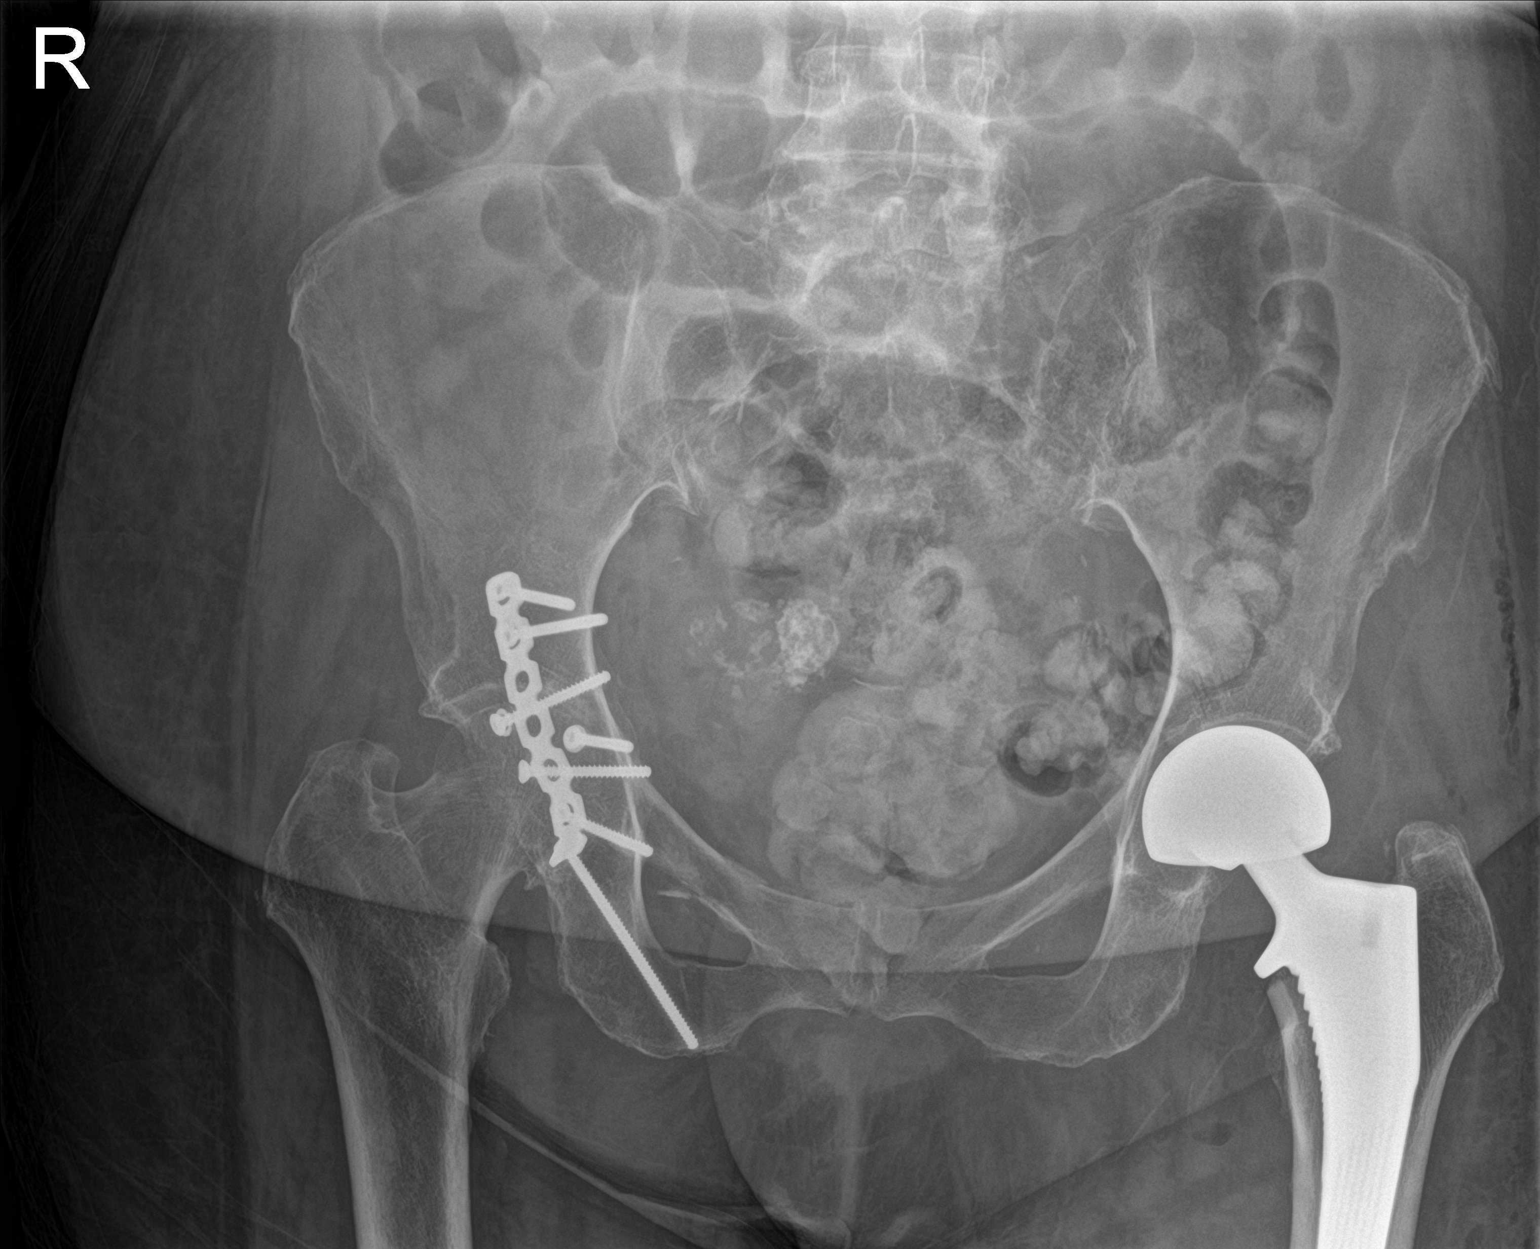

[3 of 3 positions shown; findings below may reference images not displayed]

FINDINGS: Status post left hip replacement with normal alignment. Gas in the
hip soft tissues consistent with recent surgery.

Surgical plate and fixating screws in the right acetabulum and
inferior pubic ramus. New acute mildly comminuted fracture of the
right superior pubic ramus. Calcified uterine fibroid
IMPRESSION: 1. New acute mildly comminuted fracture involving the right superior
pubic ramus.
2. Stable postsurgical changes of the right acetabulum.
3. Status post left hip replacement with postsurgical changes.

## 2019-06-12 IMAGING — DX DG SHOULDER 2+V*R*
3 series · 3 of 3 positions shown · non-contrast
Comparison: [DATE]

CLINICAL DATA: Fall

EXAM:
RIGHT SHOULDER - 2+ VIEW

[shoulder grashey]
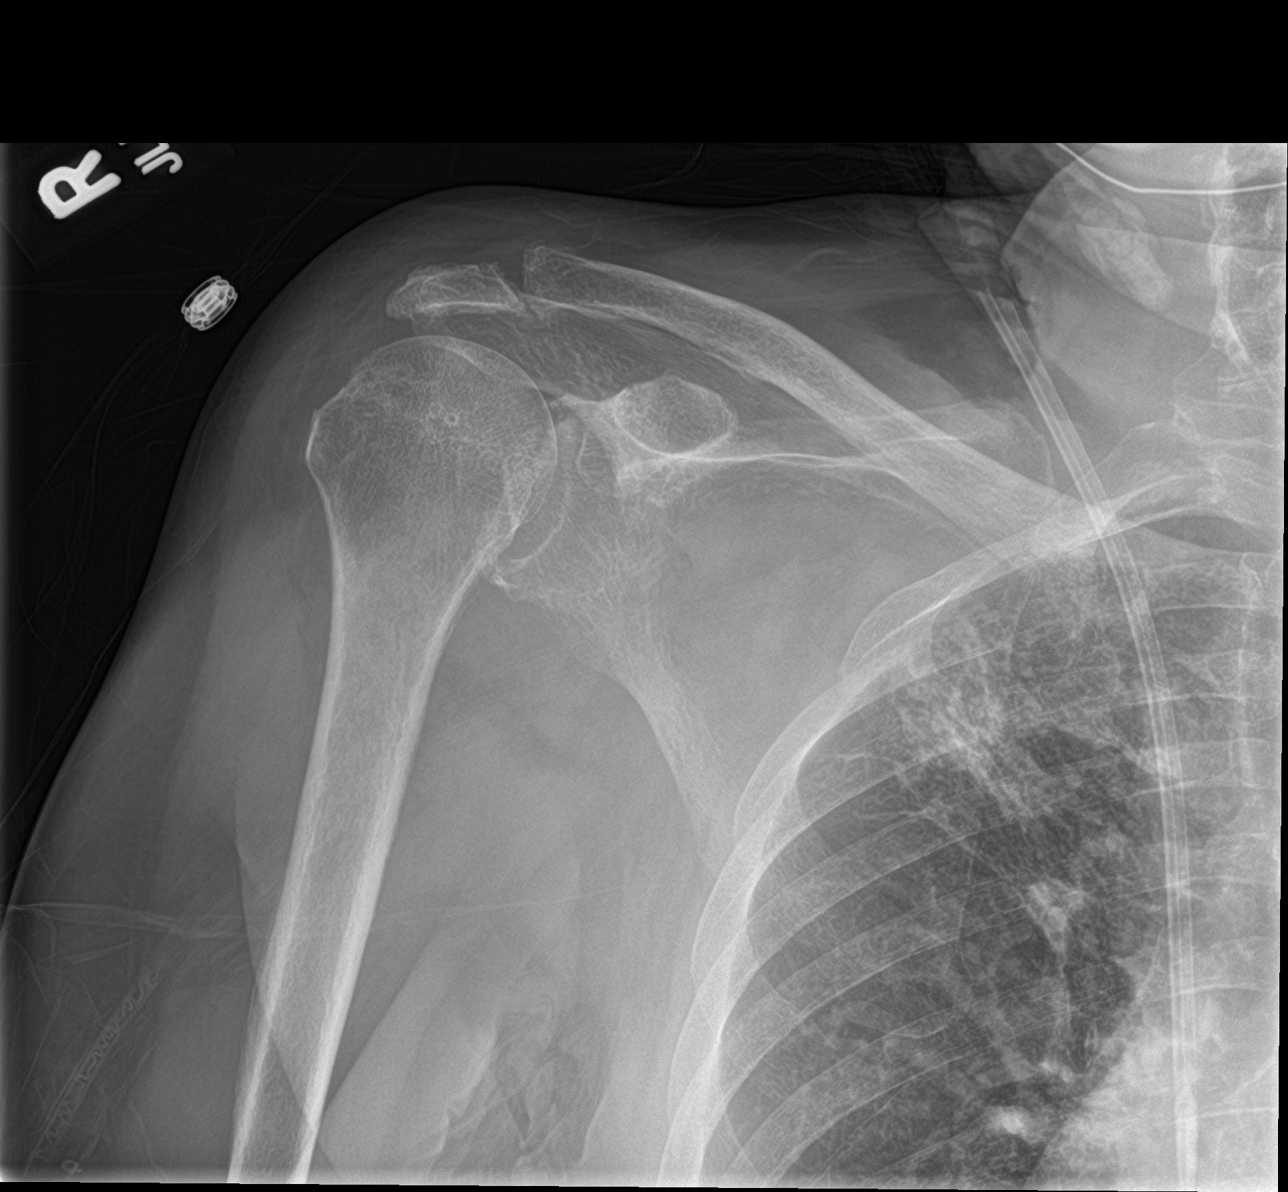

[shoulder y view]
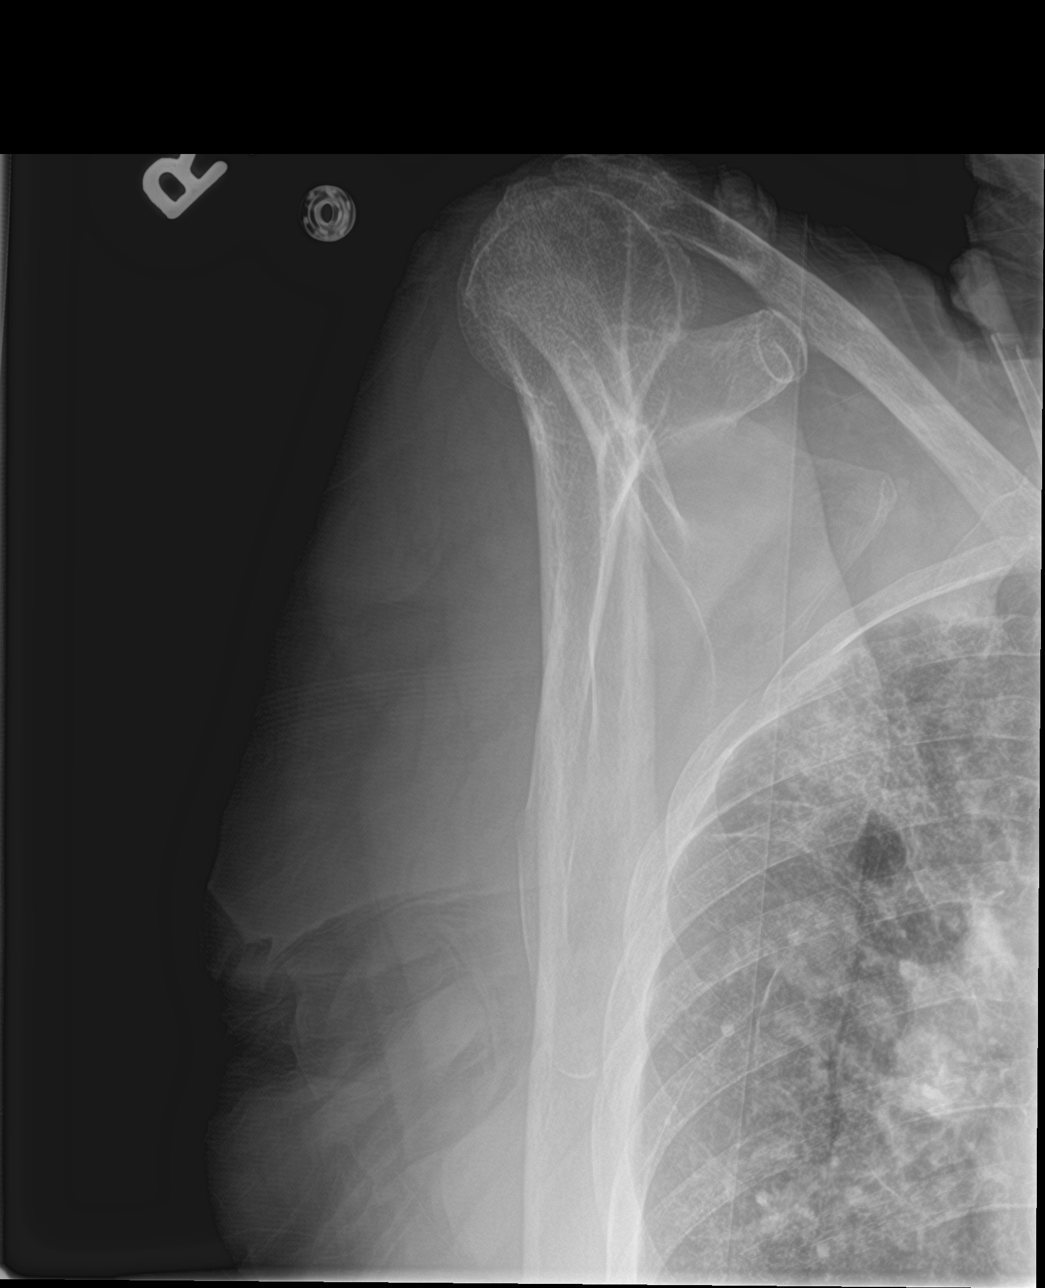

[shoulder ap neutral]
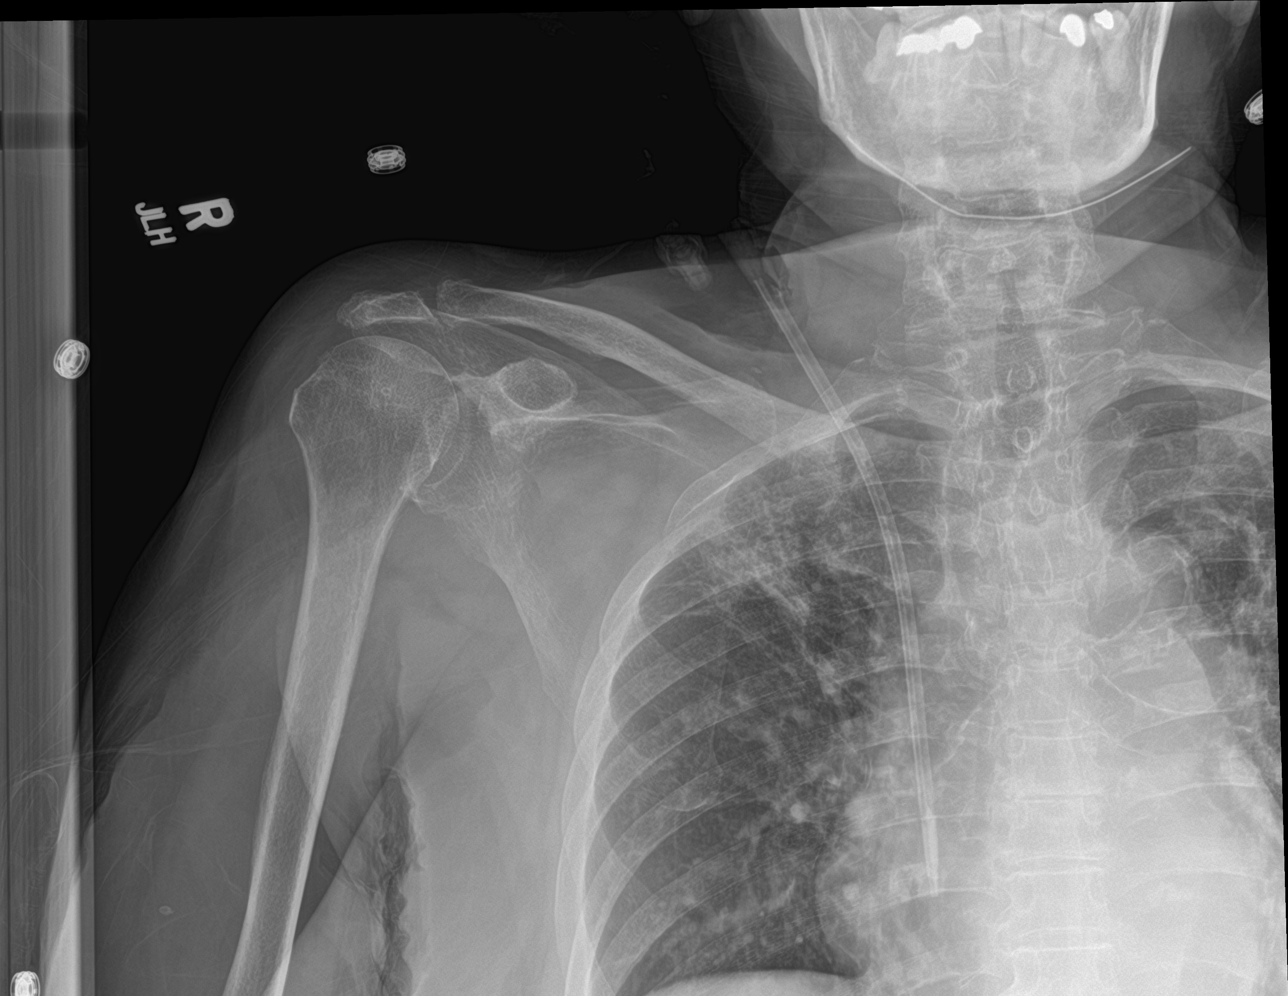

[3 of 3 positions shown; findings below may reference images not displayed]

FINDINGS: Moderate AC joint degenerative change. Mild glenohumeral
degenerative change. No fracture or dislocation. High-riding
appearance of the humeral head with effacement of subacromial space.

Right-sided central venous catheter tip over the right atrium.
Fibrosis and bronchiectasis within the right lung apex
IMPRESSION: 1. No acute osseous abnormality.
2. Degenerative changes of the AC joint and glenohumeral joint
3. High-riding humeral head consistent with rotator cuff disease
4. Fibrosis and bronchiectasis in the right apex

## 2019-06-12 MED ORDER — SODIUM CHLORIDE 0.9% FLUSH: 10.0000 mL | Freq: Two times a day (BID) | INTRAVENOUS | Status: DC

## 2019-06-12 MED ORDER — FAMOTIDINE 40 MG/5ML PO SUSR
40.0000 mg | Freq: Two times a day (BID) | ORAL | Status: AC
  Administered 2019-06-12 – 2019-06-13 (×4): 40 mg via ORAL
  Filled 2019-06-12 (×4): qty 5

## 2019-06-12 MED ORDER — QUETIAPINE FUMARATE 25 MG PO TABS
25.0000 mg | ORAL_TABLET | Freq: Every day | ORAL | Status: DC
  Administered 2019-06-12: 25 mg via ORAL
  Filled 2019-06-12: qty 1

## 2019-06-12 MED ORDER — SODIUM CHLORIDE 0.9% FLUSH: 10.0000 mL | INTRAVENOUS | Status: DC | PRN

## 2019-06-12 MED ORDER — POLYETHYLENE GLYCOL 3350 17 G PO PACK
17.0000 g | PACK | Freq: Every day | ORAL | Status: DC
  Administered 2019-06-12 – 2019-06-16 (×5): 17 g via ORAL
  Filled 2019-06-12 (×5): qty 1

## 2019-06-12 MED ORDER — BISACODYL 5 MG PO TBEC
10.0000 mg | DELAYED_RELEASE_TABLET | Freq: Once | ORAL | Status: AC
  Administered 2019-06-12: 10 mg via ORAL
  Filled 2019-06-12: qty 2

## 2019-06-12 MED ORDER — LORAZEPAM 2 MG/ML IJ SOLN: 0.5000 mg | Freq: Once | INTRAMUSCULAR | Status: DC

## 2019-06-12 NOTE — Progress Notes (Signed)
Patient ID: Bianca Myers, female   DOB: 02-Nov-1945, 75 y.o.   MRN: [id number]  PROGRESS NOTE    Bianca Myers  ZDG:[id number] DOB: [birth year] DOA: 06/07/2019 PCP: [name], MD   Brief Narrative:  74 year old female with history of end-stage renal disease on hemodialysis, diabetes mellitus type 2, hypertension, DJD, MSSA bacteremia in January 2021 presented on 06/07/2019 after a fall.  Apparently, she went to her dialysis center and as per schedule on 06/06/2019 but could not get her dialysis done due to issues with her AV fistula, subsequently went back home and then subsequently fell down, unclear if she had a syncopal event.  In the ED, patient was confused with creatinine of 13, potassium of 6.9 and bicarb of 14.  Nephrology was consulted.  She was also found to have left hip fracture for which orthopedics was consulted  Assessment & Plan:  Left hip fracture status post fall - she is s/p left hip fracture surgical repair on 06/10/2019, weightbearing as tolerated, Lovenox for DVT prophylaxis, commence PT OT, may require placement at SNF.   Hyperkalemia, End-stage renal disease on hemodialysis, Uremia with Aacute metabolic encephalopathy to missed dialysis and malfunctioning of her dialysis fistula. She has had temporary right IJ dialysis catheter placed this admission and dialysis has been started, now back on MWF schedule.  Nephrology following.  Mental status improving.  Vascular surgery planning to do left fistulogram on 06/13/2019 if this is not salvageable she will require a permanent tunneled dialysis catheter.  Leukocytosis - Probably reactive.  Improving  Mildly elevated high-sensitivity troponin - No chest pain. Likely secondary to ESRD.  No further work-up needed.  Hypertension - will place on Norvasc and monitor.  Anemia of chronic disease with mild perioperative blood loss related anemia.  She is Jehovah's Witness.  Monitor on oral iron currently no need for  transfusion.  Generalized deconditioning - We will need PT evaluation and may need rehab after left hip surgery. SNF ?[facility]  Constipation and nausea.  Abdominal exam and abdominal x-ray unremarkable, bowel regimen placed, Zofran as needed.    DVT prophylaxis: Heparin>>Lovenox  Code Status: Full  Family Communication: Evette Cristal on 06/11/2019 at [phone] - message left at 11:44 AM.  Discussed bedside on 06/12/2019. [facility] Disposition Plan: SNF after bed is available, recent left hip fracture surgical repair.  Likely discharge 06/12/2019 if bed available.  Consultants: Orthopedic/nephrology/vascular surgery  Procedures:   IR guided Right IJ temporary dialysis catheter placement on 06/08/2019  Left hip fracture surgical correction on 06/10/2019 by Dr. Erlinda Hong  Antimicrobials:  None   Subjective:  Seen in bed awake in no discomfort, had mild nausea this morning, denies any chest or abdominal pain, no bowel movement in 2 to 3 days.  Mildly confused but no weakness in any single extremity..   Objective: Vitals:   06/12/19 0035 06/12/19 0617 06/12/19 0849 06/12/19 0855  BP: (!) 128/96 (!) 159/80 (!) 124/103 (!) 153/66  Pulse: 69 75 74   Resp: 16 20 19    Temp: 98.6 F (37 C) 98.6 F (37 C) 99.2 F (37.3 C)   TempSrc: Oral Oral Oral   SpO2: 100% 97% 97%   Weight:      Height:        Intake/Output Summary (Last 24 hours) at 06/12/2019 1354 Last data filed at 06/12/2019 1300 Gross per 24 hour  Intake 620 ml  Output 2003 ml  Net -1383 ml   Filed Weights   06/09/19 1715 06/10/19 1441 06/11/19 2040  Weight: 77 kg 77 kg 78.7 kg    Examination:  Awake, mildly confused, No new F.N deficits,   [place].AT,PERRAL Supple Neck,No JVD, No cervical lymphadenopathy appriciated.  Symmetrical Chest wall movement, Good air movement bilaterally, CTAB RRR,No Gallops, Rubs or new Murmurs, No Parasternal Heave +ve B.Sounds, Abd Soft, No tenderness, No organomegaly appriciated, No rebound - guarding or  rigidity. No Cyanosis,  left hip postop site stable under bandage, right IJ temporary catheter.   Data Reviewed: I have personally reviewed following labs and imaging studies  CBC: Recent Labs  Lab 06/07/19 1116 06/07/19 1116 06/08/19 0520 06/08/19 1915 06/09/19 0336 06/11/19 0316 06/12/19 0416  WBC 16.1*   < > 11.2* 12.0* 10.8* 9.5 9.0  NEUTROABS 14.3*  --   --  9.9* 8.6* 7.0  --   HGB 12.5   < > 11.9* 13.2 11.9* 10.1* 10.1*  HCT 40.8   < > 38.0 41.0 36.7 31.9* 31.4*  MCV 103.6*   < > 101.1* 97.9 97.9 98.5 97.2  PLT 286   < > 309 290 283 195 186   < > = values in this interval not displayed.   Basic Metabolic Panel: Recent Labs  Lab 06/07/19 1116 06/07/19 1725 06/08/19 0520 06/08/19 1551 06/09/19 0336 06/11/19 0316 06/12/19 0416  NA   < >  --  131* 135 137 133* 137  K   < >  --  7.0* 5.4* 4.1 4.4 3.4*  CL   < >  --  93* 94* 92* 91* 97*  CO2   < >  --  12* 14* 21* 23 27  GLUCOSE   < >  --  169* 144* 143* 132* 119*  BUN   < >  --  120* 44* 58* 58* 24*  CREATININE   < >  --  14.25* 7.63* 9.07* 7.63* 4.57*  CALCIUM   < >  --  9.2 8.8* 8.6* 8.3* 8.0*  MG  --  2.2  --   --   --  2.0  --   PHOS  --   --   --   --   --  9.1*  --    < > = values in this interval not displayed.   GFR: Estimated Creatinine Clearance: 11.8 mL/min (A) (by C-G formula based on SCr of 4.57 mg/dL (H)). Liver Function Tests: Recent Labs  Lab 06/07/19 1116 06/08/19 1551  AST 29 55*  ALT <5 <5  ALKPHOS 84 77  BILITOT 0.9 1.1  PROT 7.9 8.0  ALBUMIN 3.4* 3.5   Recent Labs  Lab 06/07/19 1116  LIPASE 29   Recent Labs  Lab 06/08/19 1551  AMMONIA 37*   Coagulation Profile: Recent Labs  Lab 06/08/19 0603  INR 1.3*   Cardiac Enzymes: No results for input(s): CKTOTAL, CKMB, CKMBINDEX, TROPONINI in the last 168 hours. BNP (last 3 results) No results for input(s): PROBNP in the last 8760 hours. HbA1C: No results for input(s): HGBA1C in the last 72 hours. CBG: Recent Labs  Lab  06/08/19 1522 06/09/19 0749 06/10/19 0104 06/10/19 1724  GLUCAP 168* 143* 272* 116*   Lipid Profile: No results for input(s): CHOL, HDL, LDLCALC, TRIG, CHOLHDL, LDLDIRECT in the last 72 hours. Thyroid Function Tests: No results for input(s): TSH, T4TOTAL, FREET4, T3FREE, THYROIDAB in the last 72 hours. Anemia Panel: No results for input(s): VITAMINB12, FOLATE, FERRITIN, TIBC, IRON, RETICCTPCT in the last 72 hours. Sepsis Labs: Recent Labs  Lab 06/07/19 1149 06/07/19 1309  LATICACIDVEN 1.2  1.2    Recent Results (from the past 240 hour(s))  Respiratory Panel by RT PCR (Flu A&B, Covid) - Nasopharyngeal Swab     Status: None   Collection Time: 06/07/19  1:03 PM   Specimen: Nasopharyngeal Swab  Result Value Ref Range Status   SARS Coronavirus 2 by RT PCR NEGATIVE NEGATIVE Final    Comment: (NOTE) SARS-CoV-2 target nucleic acids are NOT DETECTED. The SARS-CoV-2 RNA is generally detectable in upper respiratoy specimens during the acute phase of infection. The lowest concentration of SARS-CoV-2 viral copies this assay can detect is 131 copies/mL. A negative result does not preclude SARS-Cov-2 infection and should not be used as the sole basis for treatment or other patient management decisions. A negative result may occur with  improper specimen collection/handling, submission of specimen other than nasopharyngeal swab, presence of viral mutation(s) within the areas targeted by this assay, and inadequate number of viral copies (<131 copies/mL). A negative result must be combined with clinical observations, patient history, and epidemiological information. The expected result is Negative. Fact Sheet for Patients:  PinkCheek.be Fact Sheet for Healthcare Providers:  GravelBags.it This test is not yet ap proved or cleared by the [place] FDA and  has been authorized for detection and/or diagnosis of SARS-CoV-2 by FDA  under an Emergency Use Authorization (EUA). This EUA will remain  in effect (meaning this test can be used) for the duration of the COVID-19 declaration under Section 564(b)(1) of the Act, 21 U.S.C. section 360bbb-3(b)(1), unless the authorization is terminated or revoked sooner.    Influenza A by PCR NEGATIVE NEGATIVE Final   Influenza B by PCR NEGATIVE NEGATIVE Final    Comment: (NOTE) The Xpert Xpress SARS-CoV-2/FLU/RSV assay is intended as an aid in  the diagnosis of influenza from Nasopharyngeal swab specimens and  should not be used as a sole basis for treatment. Nasal washings and  aspirates are unacceptable for Xpert Xpress SARS-CoV-2/FLU/RSV  testing. Fact Sheet for Patients: PinkCheek.be Fact Sheet for Healthcare Providers: GravelBags.it This test is not yet approved or cleared by the [place] FDA and  has been authorized for detection and/or diagnosis of SARS-CoV-2 by  FDA under an Emergency Use Authorization (EUA). This EUA will remain  in effect (meaning this test can be used) for the duration of the  Covid-19 declaration under Section 564(b)(1) of the Act, 21  U.S.C. section 360bbb-3(b)(1), unless the authorization is  terminated or revoked. Performed at [facility], [place] [address], [place], [place] [address]   Culture, blood (routine x 2)     Status: None   Collection Time: 06/07/19  6:48 PM   Specimen: BLOOD  Result Value Ref Range Status   Specimen Description BLOOD RIGHT ANTECUBITAL  Final   Special Requests   Final    BOTTLES DRAWN AEROBIC AND ANAEROBIC Blood Culture adequate volume   Culture   Final    NO GROWTH 5 DAYS Performed at [facility], [place] [address], [place], [place] [address]    Report Status 06/12/2019 FINAL  Final  Culture, blood (routine x 2)     Status: None (Preliminary result)   Collection Time: 06/07/19 11:04 PM   Specimen: BLOOD  Result Value Ref Range Status    Specimen Description BLOOD RIGHT ANTECUBITAL  Final   Special Requests   Final    BOTTLES DRAWN AEROBIC AND ANAEROBIC Blood Culture adequate volume   Culture   Final    NO GROWTH 4 DAYS Performed at  [facility], [place] [address] [place], [place] [address]    Report Status PENDING  Incomplete  Surgical pcr screen     Status: None   Collection Time: 06/09/19  6:43 AM   Specimen: Nasal Mucosa; Nasal Swab  Result Value Ref Range Status   MRSA, PCR NEGATIVE NEGATIVE Final   Staphylococcus aureus NEGATIVE NEGATIVE Final    Comment: (NOTE) The Xpert SA Assay (FDA approved for NASAL specimens in patients 76 years of age and older), is one component of a comprehensive surveillance program. It is not intended to diagnose infection nor to guide or monitor treatment. Performed at [facility], [place] [address] [place], [place] [address]      Radiology Studies: DG Pelvis Portable  Result Date: 06/11/2019 CLINICAL DATA:  Status post hip replacement. EXAM: PORTABLE PELVIS 1-2 VIEWS COMPARISON:  None. FINDINGS: The patient is status post left hip replacement. Hardware is in good position. No other acute abnormalities. IMPRESSION: Left hip replacement as above. Electronically Signed   By: Dorise Bullion III M.D   On: 06/11/2019 13:08   DG Abd Portable 1V  Result Date: 06/12/2019 CLINICAL DATA:  Nausea and vomiting. EXAM: PORTABLE ABDOMEN - 1 VIEW COMPARISON:  CT 06/07/2019 and pelvis 06/11/2019 FINDINGS: Bowel gas pattern is nonobstructive. No free peritoneal air. Degenerate change of the spine. Fixation hardware over the right acetabular region intact. Left hip arthroplasty intact. IMPRESSION: Nonobstructive bowel gas pattern. Electronically Signed   By: Marin Olp M.D.   On: 06/12/2019 10:26   DG C-Arm 1-60 Min  Result Date: 06/10/2019 CLINICAL DATA:  Left hip replacement EXAM: OPERATIVE LEFT HIP (WITH PELVIS IF PERFORMED) 4 VIEWS TECHNIQUE: Fluoroscopic spot image(s) were  submitted for interpretation post-operatively. COMPARISON:  06/07/2019 FINDINGS: Intraoperative spot images demonstrate changes of left hip replacement. Normal AP alignment. No hardware or bony complicating feature. IMPRESSION: Left hip replacement.  No visible complicating feature. Electronically Signed   By: Rolm Baptise M.D.   On: 06/10/2019 18:47   DG HIP OPERATIVE UNILAT W OR W/O PELVIS LEFT  Result Date: 06/10/2019 CLINICAL DATA:  Left hip replacement EXAM: OPERATIVE LEFT HIP (WITH PELVIS IF PERFORMED) 4 VIEWS TECHNIQUE: Fluoroscopic spot image(s) were submitted for interpretation post-operatively. COMPARISON:  06/07/2019 FINDINGS: Intraoperative spot images demonstrate changes of left hip replacement. Normal AP alignment. No hardware or bony complicating feature. IMPRESSION: Left hip replacement.  No visible complicating feature. Electronically Signed   By: Rolm Baptise M.D.   On: 06/10/2019 18:47   Scheduled Meds: . albuterol  2.5 mg Nebulization Once  . amLODipine  10 mg Oral Daily  . atorvastatin  10 mg Oral QHS  . Chlorhexidine Gluconate Cloth  6 each Topical Q0600  . Chlorhexidine Gluconate Cloth  6 each Topical Q0600  . docusate sodium  100 mg Oral BID  . enoxaparin (LOVENOX) injection  30 mg Subcutaneous Q24H  . escitalopram  10 mg Oral Daily  . ferric citrate  420 mg Oral TID WC  . heparin  2,000 Units Dialysis Once in dialysis  . labetalol  200 mg Oral BID  . QUEtiapine  25 mg Oral QHS   Continuous Infusions:   Total time spent: 30 minutes  Signature  Lala Lund M.D on 06/12/2019 at 1:54 PM   -  To page go to [email or web address]

## 2019-06-12 NOTE — Evaluation (Signed)
Occupational Therapy Evaluation Patient Details Name: Joss Friedel MRN: [id number] DOB: 1946/02/13 Today's Date: 06/12/2019    History of Present Illness Deshanti Adcox is a 74 y.o. female with medical history significant for ESRD on hemodialysis, type 2 diabetes mellitus, hypertension, DJD, history of MSSA bacteremia who presemts to the emergency room after a fall, resulting in a L hip fracture and found to have acute metabolic encephelopathy. S/p anterior hip hemiarthroplasty 06/10/19   Clinical Impression   Pt PTA: Pt independent with ADL and mobility. Pt currently, pt limited by requiring multimodal cues to attend to tasks and sequence through them; pt with decreased cognition, inability to care for self and decreased strength.  Pt modA +2 for mobility using stedy and modA +2 for ADL tasks. Pt maxA +2 for bed mobility due to weakness/pain in LLE. Pt would benefit from continued OT skilled services for ADL, mobility and safety. OT following acutely.    Follow Up Recommendations  SNF    Equipment Recommendations  Other (comment)(next venue to decide)    Recommendations for Other Services       Precautions / Restrictions Precautions Precautions: Fall Restrictions Weight Bearing Restrictions: Yes LLE Weight Bearing: Weight bearing as tolerated      Mobility Bed Mobility Overal bed mobility: Needs Assistance Bed Mobility: Supine to Sit     Supine to sit: Max assist;+2 for physical assistance     General bed mobility comments: maxA +2 to move BLEs and trunk elevation at same time as LLE difficult to move  Transfers Overall transfer level: Needs assistance Equipment used: Ambulation equipment used Transfers: Sit to/from [facility] Sit to Stand: Mod assist;+2 physical assistance;+2 safety/equipment;From elevated surface Stand pivot transfers: Mod assist;+2 physical assistance;+2 safety/equipment;From elevated surface       General transfer comment:  Pt overall modA +2 for sit to stands from bed, recliner and from stedy.     Balance Overall balance assessment: Needs assistance Sitting-balance support: Bilateral upper extremity supported;Feet supported Sitting balance-Leahy Scale: Fair     Standing balance support: Bilateral upper extremity supported Standing balance-Leahy Scale: Poor Standing balance comment: Pt holding onto stedy for stability                           ADL either performed or assessed with clinical judgement   ADL Overall ADL's : Needs assistance/impaired Eating/Feeding: Set up;Sitting   Grooming: Minimal assistance;Sitting   Upper Body Bathing: Minimal assistance;Sitting   Lower Body Bathing: Moderate assistance;+2 for physical assistance;Cueing for safety;Sitting/lateral leans;Sit to/from stand   Upper Body Dressing : Minimal assistance;Sitting   Lower Body Dressing: Moderate assistance;+2 for physical assistance;+2 for safety/equipment;Cueing for safety;Cueing for sequencing;Sitting/lateral leans;Sit to/from stand   Toilet Transfer: Moderate assistance;+2 for physical assistance;+2 for safety/equipment;Stand-pivot Toilet Transfer Details (indicate cue type and reason): stedy for transfers         Functional mobility during ADLs: Moderate assistance;+2 for physical assistance;+2 for safety/equipment;Rolling walker General ADL Comments: Pt requiring multimodal cues to attend to tasks and sequence through them; pt with decreased cognition, inability to care for self and decreased strength.      Vision Baseline Vision/History: No visual deficits Patient Visual Report: No change from baseline Vision Assessment?: No apparent visual deficits     Perception     Praxis      Pertinent Vitals/Pain Pain Assessment: Faces Faces Pain Scale: Hurts even more Pain Location: L hip Pain Descriptors / Indicators: Discomfort;Grimacing;Guarding Pain Intervention(s): Monitored during  session;Repositioned     Hand Dominance Right   Extremity/Trunk Assessment Upper Extremity Assessment Upper Extremity Assessment: Generalized weakness   Lower Extremity Assessment Lower Extremity Assessment: Defer to PT evaluation;Generalized weakness   Cervical / Trunk Assessment Cervical / Trunk Assessment: Normal   Communication Communication Communication: Receptive difficulties;Expressive difficulties   Cognition Arousal/Alertness: Awake/alert Behavior During Therapy: WFL for tasks assessed/performed Overall Cognitive Status: Impaired/Different from baseline Area of Impairment: Attention;Memory;Following commands;Safety/judgement;Awareness                   Current Attention Level: Selective Memory: Decreased short-term memory Following Commands: Follows one step commands inconsistently;Follows multi-step commands inconsistently Safety/Judgement: Decreased awareness of safety;Decreased awareness of deficits Awareness: Emergent   General Comments: Pt unable to recall place or situation. Pt with decreased awareness of deficits and safety. Pt requires increased time to follow commands.   General Comments  VSS on RA.    Exercises     Shoulder Instructions      Home Living Family/patient expects to be discharged to:: Private residence Living Arrangements: Children Available Help at Discharge: Family;Available PRN/intermittently Type of Home: House Home Access: Stairs to enter [employer] of Steps: 2 Entrance Stairs-Rails: None Home Layout: One level     Bathroom Shower/Tub: Tub/shower unit;Walk-in shower   Bathroom Toilet: Standard     Home Equipment: Environmental consultant - 2 wheels;Cane - single point;Bedside commode;Shower seat          Prior Functioning/Environment Level of Independence: Independent        Comments: Community ambulator w/cane. No longer drives. Ind. w/all other ADL's        OT Problem List: Decreased strength;Decreased  activity tolerance;Impaired balance (sitting and/or standing);Decreased cognition;Decreased safety awareness;Pain;Increased edema;Decreased knowledge of use of DME or AE      OT Treatment/Interventions: Self-care/ADL training;Therapeutic exercise;Energy conservation;DME and/or AE instruction;Therapeutic activities;Cognitive remediation/compensation;Visual/perceptual remediation/compensation;Patient/family education;Balance training    OT Goals(Current goals can be found in the care plan section) Acute Rehab OT Goals Patient Stated Goal: to increase ability to care for self OT Goal Formulation: With patient Time For Goal Achievement: 06/26/19 Potential to Achieve Goals: Good ADL Goals Pt Will Perform Grooming: with min guard assist;standing Pt Will Transfer to Toilet: with min assist;stand pivot transfer;bedside commode Additional ADL Goal #1: Pt will follow 1 step commands consistently with minimal verbal cueing to sequence through tasks, Additional ADL Goal #2: Pt will perform x10 mins of ADL tasks with 1 seated rest break in order to increase independence with ADL endurance.  OT Frequency: Min 2X/week   Barriers to D/C: Decreased caregiver support          Co-evaluation              AM-PAC OT "6 Clicks" Daily Activity     Outcome Measure Help from another person eating meals?: A Little Help from another person taking care of personal grooming?: A Little Help from another person toileting, which includes using toliet, bedpan, or urinal?: A Lot Help from another person bathing (including washing, rinsing, drying)?: A Lot Help from another person to put on and taking off regular upper body clothing?: A Little Help from another person to put on and taking off regular lower body clothing?: A Lot 6 Click Score: 15   End of Session Equipment Utilized During Treatment: Gait belt Nurse Communication: Mobility status;Need for lift equipment(use of stedy)  Activity Tolerance: Patient  tolerated treatment well;Patient limited by pain Patient left: in chair;with call bell/phone within reach;with chair alarm set;with nursing/sitter  in room  OT Visit Diagnosis: Unsteadiness on feet (R26.81);Muscle weakness (generalized) (M62.81);Pain;Other symptoms and signs involving cognitive function Pain - Right/Left: Left Pain - part of body: Hip                Time: 1036-1101 OT Time Calculation (min): 25 min Charges:  OT General Charges $OT Visit: 1 Visit OT Evaluation $OT Eval Moderate Complexity: 1 Mod OT Treatments $Therapeutic Activity: 8-22 mins  Jefferey Pica, OTR/L Acute Rehabilitation Services Pager: [phone] Office: [phone]   Marenda Accardi C 06/12/2019, 4:53 PM

## 2019-06-12 NOTE — Progress Notes (Signed)
Patient came back from dialysis confused and agitated. Pt is refusing care and her medications at this time. Blount, NP notified via amion and orders received in epic but patient refused intervention stating she doesn't want this nurse to "touch" her. Patient's son was called and he asked if he could come and be with patient to help calm her down. Charge Nurse Maudie Mercury, communicated with Alma Friendly, from [facility] Coverage and she advised it was okay for patient's son to come to the hospital at this time to be with patient. Will continue to monitor.   Bianca Shugars,RN.

## 2019-06-12 NOTE — Progress Notes (Signed)
Subjective:  Up in bedside chair , no cos   Objective Vital signs in last 24 hours: Vitals:   06/12/19 0035 06/12/19 0617 06/12/19 0849 06/12/19 0855  BP: (!) 128/96 (!) 159/80 (!) 124/103 (!) 153/66  Pulse: 69 75 74   Resp: 16 20 19    Temp: 98.6 F (37 C) 98.6 F (37 C) 99.2 F (37.3 C)   TempSrc: Oral Oral Oral   SpO2: 100% 97% 97%   Weight:      Height:       Weight change: 1.7 kg  Physical Exam: General [place]  AAF :alert up in bedside chair  nad Lungs: CTA bilaterally Heart:RRR, no rub Abdomen: soft NT/ ND Lower extremities: No LE edema Dialysis Access:LUE AVF +bruit; Temp cath right neck  Dialysis: [facility] MWF 4h 350/800 EDW 77kg 2K/2.25Ca UFP 4 AVF Heparin 2000 Mircera 200 (last 3/31)  Problem/Plan:: 1. Confusion/AMS on 4/7. Concern for seizure/uremia. Admit BUN 120/Cr 14.5- EEG nonspecific. Improved. MS AND LABS improved BUN 24 Cr 4.57 this am  2. Left femoral neck fracture s/p syncopal event at home. Manson Passey.surgery 06/10/19  3. Hyperkalemia - k 3.4 Resolved with HD. use 4.0 k bath tomor  4. ESRD -HD MWF. Missed HD Monday d/t difficult cannulation of AVF.Unable to cannulate fistulaTuesday. Appreciate IR placement of temp cath4/7.Had HD 4/7.High BUN120/ creat.14.5HD again 4/8. Improved BUN 58 Scr 9.07HD Sat today  -then resume MWF next week. 5. AVF malfunction - ongoing difficulty with cannulation. VVS consulted- will need fistulogram when stable. 6. Hypertension/volume - Hypertensive on arrival. Now controlled. On amlodipine 5, labetalol 200 bid. 7. Anemia - Hgb10.1 No ESA needs. 8. Metabolic bone disease - Continue Auryixa binders/VDRA/1st= phos 9.1  Fu after binders 2 ac in hosp . 9. Nutrition - Renal diet/fluid restriction when eating.  Ernest Haber, PA-C [facility] Beeper [phone] 06/12/2019,3:11 PM  LOS: 5 days   Labs: Basic Metabolic Panel: Recent Labs  Lab 06/09/19 0336 06/11/19 0316  06/12/19 0416  NA 137 133* 137  K 4.1 4.4 3.4*  CL 92* 91* 97*  CO2 21* 23 27  GLUCOSE 143* 132* 119*  BUN 58* 58* 24*  CREATININE 9.07* 7.63* 4.57*  CALCIUM 8.6* 8.3* 8.0*  PHOS  --  9.1*  --    Liver Function Tests: Recent Labs  Lab 06/07/19 1116 06/08/19 1551  AST 29 55*  ALT <5 <5  ALKPHOS 84 77  BILITOT 0.9 1.1  PROT 7.9 8.0  ALBUMIN 3.4* 3.5   Recent Labs  Lab 06/07/19 1116  LIPASE 29   Recent Labs  Lab 06/08/19 1551  AMMONIA 37*   CBC: Recent Labs  Lab 06/08/19 0520 06/08/19 0520 06/08/19 1915 06/08/19 1915 06/09/19 0336 06/11/19 0316 06/12/19 0416  WBC 11.2*   < > 12.0*   < > 10.8* 9.5 9.0  NEUTROABS  --   --  9.9*  --  8.6* 7.0  --   HGB 11.9*   < > 13.2   < > 11.9* 10.1* 10.1*  HCT 38.0   < > 41.0   < > 36.7 31.9* 31.4*  MCV 101.1*  --  97.9  --  97.9 98.5 97.2  PLT 309   < > 290   < > 283 195 186   < > = values in this interval not displayed.   Cardiac Enzymes: No results for input(s): CKTOTAL, CKMB, CKMBINDEX, TROPONINI in the last 168 hours. CBG: Recent Labs  Lab 06/08/19 1522 06/09/19 0749 06/10/19 0104 06/10/19 1724  GLUCAP 168* 143* 272* 116*    Studies/Results: DG Pelvis Portable  Result Date: 06/11/2019 CLINICAL DATA:  Status post hip replacement. EXAM: PORTABLE PELVIS 1-2 VIEWS COMPARISON:  None. FINDINGS: The patient is status post left hip replacement. Hardware is in good position. No other acute abnormalities. IMPRESSION: Left hip replacement as above. Electronically Signed   By: Dorise Bullion III M.D   On: 06/11/2019 13:08   DG Abd Portable 1V  Result Date: 06/12/2019 CLINICAL DATA:  Nausea and vomiting. EXAM: PORTABLE ABDOMEN - 1 VIEW COMPARISON:  CT 06/07/2019 and pelvis 06/11/2019 FINDINGS: Bowel gas pattern is nonobstructive. No free peritoneal air. Degenerate change of the spine. Fixation hardware over the right acetabular region intact. Left hip arthroplasty intact. IMPRESSION: Nonobstructive bowel gas pattern.  Electronically Signed   By: Marin Olp M.D.   On: 06/12/2019 10:26   DG C-Arm 1-60 Min  Result Date: 06/10/2019 CLINICAL DATA:  Left hip replacement EXAM: OPERATIVE LEFT HIP (WITH PELVIS IF PERFORMED) 4 VIEWS TECHNIQUE: Fluoroscopic spot image(s) were submitted for interpretation post-operatively. COMPARISON:  06/07/2019 FINDINGS: Intraoperative spot images demonstrate changes of left hip replacement. Normal AP alignment. No hardware or bony complicating feature. IMPRESSION: Left hip replacement.  No visible complicating feature. Electronically Signed   By: Rolm Baptise M.D.   On: 06/10/2019 18:47   DG HIP OPERATIVE UNILAT W OR W/O PELVIS LEFT  Result Date: 06/10/2019 CLINICAL DATA:  Left hip replacement EXAM: OPERATIVE LEFT HIP (WITH PELVIS IF PERFORMED) 4 VIEWS TECHNIQUE: Fluoroscopic spot image(s) were submitted for interpretation post-operatively. COMPARISON:  06/07/2019 FINDINGS: Intraoperative spot images demonstrate changes of left hip replacement. Normal AP alignment. No hardware or bony complicating feature. IMPRESSION: Left hip replacement.  No visible complicating feature. Electronically Signed   By: Rolm Baptise M.D.   On: 06/10/2019 18:47   Medications:  . albuterol  2.5 mg Nebulization Once  . amLODipine  10 mg Oral Daily  . atorvastatin  10 mg Oral QHS  . Chlorhexidine Gluconate Cloth  6 each Topical Q0600  . Chlorhexidine Gluconate Cloth  6 each Topical Q0600  . docusate sodium  100 mg Oral BID  . enoxaparin (LOVENOX) injection  30 mg Subcutaneous Q24H  . escitalopram  10 mg Oral Daily  . famotidine  40 mg Oral BID  . ferric citrate  420 mg Oral TID WC  . heparin  2,000 Units Dialysis Once in dialysis  . labetalol  200 mg Oral BID  . polyethylene glycol  17 g Oral Daily  . QUEtiapine  25 mg Oral QHS

## 2019-06-12 NOTE — Progress Notes (Signed)
  Progress Note    06/12/2019 10:11 AM 2 Days Post-Op  Subjective: Confused overnight  Vitals:   06/12/19 0617 06/12/19 0849  BP: (!) 159/80 (!) 124/103  Pulse: 75 98  Resp: 20 19  Temp: 98.6 F (37 C) 99.2 F (37.3 C)  SpO2: 97% 97%    Physical Exam: Oriented to person place this morning Nonlabored respirations Left arm fistula has a thrill with evidence of infiltration Right neck with temporary catheter  CBC    Component Value Date/Time   WBC 9.0 06/12/2019 0416   RBC 3.23 (L) 06/12/2019 0416   HGB 10.1 (L) 06/12/2019 0416   HCT 31.4 (L) 06/12/2019 0416   PLT 186 06/12/2019 0416   MCV 97.2 06/12/2019 0416   MCH 31.3 06/12/2019 0416   MCHC 32.2 06/12/2019 0416   RDW 17.4 (H) 06/12/2019 0416   LYMPHSABS 1.2 06/11/2019 0316   MONOABS 1.1 (H) 06/11/2019 0316   EOSABS 0.2 06/11/2019 0316   BASOSABS 0.0 06/11/2019 0316    BMET    Component Value Date/Time   NA 137 06/12/2019 0416   K 3.4 (L) 06/12/2019 0416   CL 97 (L) 06/12/2019 0416   CO2 27 06/12/2019 0416   GLUCOSE 119 (H) 06/12/2019 0416   BUN 24 (H) 06/12/2019 0416   CREATININE 4.57 (H) 06/12/2019 0416   CALCIUM 8.0 (L) 06/12/2019 0416   GFRNONAA 9 (L) 06/12/2019 0416   GFRAA 10 (L) 06/12/2019 0416    INR    Component Value Date/Time   INR 1.3 (H) 06/08/2019 0603     Intake/Output Summary (Last 24 hours) at 06/12/2019 1011 Last data filed at 06/12/2019 0900 Gross per 24 hour  Intake 650 ml  Output 2003 ml  Net -1353 ml     Assessment/plan:  74 y.o. female is here with confusion and hip fracture now status post repair.  She was pleasantly confused overnight this is somewhat resolving.  She needs fistulogram of the left upper extremity we will plan for this tomorrow.  Patient be n.p.o. past midnight.  She continues to have issues may need to defer this to later in the week.  If fistula is not salvageable will need can consideration of tunneled dialysis catheter given that she currently has  temporary catheter in place.    Erroll Wilbourne C. Donzetta Matters, MD [facility] Office: [phone] Pager: [phone]  06/12/2019 10:11 AM

## 2019-06-12 NOTE — Progress Notes (Signed)
Patient's son at the bedside now, speaking to his mother. Will continue to monitor.   Farrie Sann,RN.

## 2019-06-12 NOTE — TOC Initial Note (Signed)
Transition of Care [facility]) - Initial/Assessment Note    Patient Details  Name: Bianca Myers MRN: [id number] Date of Birth: 1946/01/21  Transition of Care [facility]) CM/SW Contact:    Jacquelynn Cree Phone Number: 06/12/2019, 9:46 AM  Clinical Narrative:                 CSW received consult for possible short-term SNF placement at time of discharge. CSW met bedside with patient & patient's son Brenton Grills [phone] regarding PT recommendation of SNF placement at time of discharge. Patient's son expressed understanding of PT recommendation and expressed after speaking with the surgeon, SNF placement would be the best discharge plan. CSW discussed insurance authorization process and provided Medicare SNF ratings list to patient's son. CSW explained a SNF that provides dialysis transportation will be necessary. Patient receives HD MWF at [facility].  Patient's son expressed patient is in need of a wheel chair and raised toilet seat upon discharge home.   Insurance authorization has been initiated Reference [id number]. No further questions reported at this time. CSW to continue to follow and assist with discharge planning needs.   Expected Discharge Plan: Skilled Nursing Facility Barriers to Discharge: Continued Medical Work up, Ship broker, Environmental education officer)   Patient Goals and CMS Choice   CMS Medicare.gov Compare Post Acute Care list provided to:: Patient Represenative (must comment)(Jermaine, son) Choice offered to / list presented to : Adult Children  Expected Discharge Plan and Services Expected Discharge Plan: [place] arrangements for the past 2 months: Single Family Home                                      Prior Living Arrangements/Services Living arrangements for the past 2 months: Single Family Home Lives with:: Adult Children Patient language and need for interpreter reviewed:: Yes Do you  feel safe going back to the place where you live?: Yes      Need for Family Participation in Patient Care: Yes (Comment) Care giver support system in place?: Yes (comment)   Criminal Activity/Legal Involvement Pertinent to Current Situation/Hospitalization: No - Comment as needed  Activities of Daily Living      Permission Sought/Granted Permission sought to share information with : Facility Sport and exercise psychologist, Family Supports Permission granted to share information with : Yes, Verbal Permission Granted  Share Information with NAME: Brenton Grills  Permission granted to share info w AGENCY: SNFs  Permission granted to share info w Relationship: Son  Permission granted to share info w Contact Information: [phone]  Emotional Assessment   Attitude/Demeanor/Rapport: Unable to Assess Affect (typically observed): Unable to Assess Orientation: : Oriented to Self, Oriented to Place, Oriented to  Time, Oriented to Situation Alcohol / Substance Use: Not Applicable Psych Involvement: No (comment)  Admission diagnosis:  Hyperkalemia [E87.5] Fracture [T14.8XXA] Syncope [R55] Dialysis AV fistula malfunction ([place]) [T82.590A] Closed fracture of left hip, initial encounter ([place]) [S72.002A] Closed fracture of multiple ribs of right side, initial encounter [S22.41XA] Syncope, unspecified syncope type [R55] Patient Active Problem List   Diagnosis Date Noted  . Left displaced femoral neck fracture ([place])   . Syncope 06/07/2019  . MSSA bacteremia 03/10/2019  . Diabetes mellitus ([place]) 03/10/2019  . 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  . Mild aortic stenosis 03/10/2019  . HCAP (healthcare-associated pneumonia) 03/09/2019  . Heart failure ([place]) 08/31/2018  . Anemia of chronic kidney  failure, stage 5 ([place]) 08/27/2018  . Chronic depression 08/27/2018  . Patient is Jehovah's Witness 08/27/2018  . Essential hypertension   . End-stage renal disease on hemodialysis ([place]) 12/10/2015   PCP:  [name], MD Pharmacy:   [facility] [place], [place] AT [address] [place] [address] Phone: [phone] Fax: [phone]     Social Determinants of Health (SDOH) Interventions    Readmission Risk Interventions No flowsheet data found.

## 2019-06-12 NOTE — Social Work (Deleted)
RE: Bianca Myers Date of Birth: [birth year]   To Whom It May Concern:  Please be advised that the above-named patient will require a short-term nursing home stay - anticipated 30 days or less for rehabilitation and strengthening.  The plan is for return home.

## 2019-06-12 NOTE — NC FL2 (Addendum)
[place] LEVEL OF CARE SCREENING TOOL     IDENTIFICATION  Patient Name: Bianca Myers Birthdate: March 09, 1945 Sex: female Admission Date (Current Location): 06/07/2019  [facility] and [place] Number:  [employer] and Address:  [facility] [place], [place] [address] [place], [place] [address]      Provider Number: [id number]  Attending Physician Name and Address:  Thurnell Lose, MD  Relative Name and Phone Number:  Brenton Grills [phone]    Current Level of Care: Hospital Recommended Level of Care: [place] Prior Approval Number:    Date Approved/Denied:   PASRR Number: Pending  Discharge Plan: SNF    Current Diagnoses: Patient Active Problem List   Diagnosis Date Noted  . Left displaced femoral neck fracture ([place])   . Syncope 06/07/2019  . MSSA bacteremia 03/10/2019  . Diabetes mellitus ([place]) 03/10/2019  . 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  . Mild aortic stenosis 03/10/2019  . HCAP (healthcare-associated pneumonia) 03/09/2019  . Heart failure ([place]) 08/31/2018  . Anemia of chronic kidney failure, stage 5 ([place]) 08/27/2018  . Chronic depression 08/27/2018  . Patient is Jehovah's Witness 08/27/2018  . Essential hypertension   . End-stage renal disease on hemodialysis ([place]) 12/10/2015    Orientation RESPIRATION BLADDER Height & Weight     Self, Time, Situation, Place  Normal Incontinent Weight: 173 lb 8 oz (78.7 kg) Height:  5\' 7"  (170.2 cm)  BEHAVIORAL SYMPTOMS/MOOD NEUROLOGICAL BOWEL NUTRITION STATUS      Continent Diet(See discharge summary)  AMBULATORY STATUS  COMMUNICATION OF NEEDS Skin    Extensive Assistnace Verbally Normal, Other (Comment)(Closed left hip incision. ABD dressing)                       Personal Care Assistance Level of Assistance  Bathing, Feeding, Dressing   Bathing Assistance: Maximum assistance Feeding assistance: Limited assistance Dressing Assistance: Maximum assistance          Functional Limitations Info  Sight, Hearing, Speech Sight Info: Adequate Hearing Info: Adequate Speech Info: Adequate    SPECIAL CARE FACTORS FREQUENCY  PT (By licensed PT), OT (By licensed OT)     PT Frequency: 5x a week OT Frequency: 5x a week            Contractures Contractures Info: Not present    Additional Factors Info  Code Status, Allergies Code Status Info: Full Allergies Info: NKA           Current Medications (06/12/2019):  This is the current hospital active medication list Current Facility-Administered Medications  Medication Dose Route Frequency Provider Last Rate Last Admin  . acetaminophen (TYLENOL) tablet 650 mg  650 mg Oral Q8H PRN Leandrew Koyanagi, MD      . albuterol (PROVENTIL) (2.5 MG/3ML) 0.083% nebulizer solution 2.5 mg  2.5 mg Nebulization Once Leandrew Koyanagi, MD      . amLODipine (NORVASC) tablet 10 mg  10 mg Oral Daily Thurnell Lose, MD   10 mg at 06/11/19 1347  . atorvastatin (LIPITOR) tablet 10 mg  10 mg Oral QHS Leandrew Koyanagi, MD   10 mg at 06/10/19 2157  . bisacodyl (DULCOLAX) EC tablet 10 mg  10 mg Oral Once Thurnell Lose, MD      . Chlorhexidine Gluconate Cloth 2 % PADS 6 each  6 each Topical Q0600 Leandrew Koyanagi, MD   6 each at 06/12/19 [phone]  . Chlorhexidine Gluconate Cloth 2 % PADS 6  each  6 each Topical Q0600 Leandrew Koyanagi, MD   6 each at 06/12/19 [phone]  . docusate sodium (COLACE) capsule 100 mg  100 mg Oral BID Leandrew Koyanagi, MD   100 mg at 06/11/19 3128  . enoxaparin (LOVENOX) injection 30 mg  30 mg Subcutaneous Q24H Rolla Flatten, [place]      . escitalopram (LEXAPRO) tablet 10 mg  10 mg Oral Daily Leandrew Koyanagi, MD   10 mg at 06/11/19 1188  . ferric citrate (AURYXIA) tablet 420 mg  420 mg Oral TID WC Leandrew Koyanagi, MD   420 mg at 06/11/19 1347  . heparin injection 2,000 Units  2,000 Units Dialysis Once in dialysis Leandrew Koyanagi, MD      . HYDROcodone-acetaminophen (NORCO/VICODIN) 5-325 MG per tablet 1 tablet  1 tablet  Oral Q8H PRN Thurnell Lose, MD   1 tablet at 06/12/19 0413  . labetalol (NORMODYNE) tablet 200 mg  200 mg Oral BID Leandrew Koyanagi, MD   200 mg at 06/11/19 6773  . ondansetron (ZOFRAN) injection 4 mg  4 mg Intravenous Q6H PRN Thurnell Lose, MD   4 mg at 06/12/19 0918  . polyethylene glycol (MIRALAX / GLYCOLAX) packet 17 g  17 g Oral Daily PRN Leandrew Koyanagi, MD      . QUEtiapine (SEROQUEL) tablet 25 mg  25 mg Oral QHS Lala Lund K, MD      . sodium chloride flush (NS) 0.9 % injection 10-40 mL  10-40 mL Intracatheter PRN Leandrew Koyanagi, MD      . sorbitol 70 % solution 30 mL  30 mL Oral Daily PRN Leandrew Koyanagi, MD         Discharge Medications: Please see discharge summary for a list of discharge medications.  Relevant Imaging Results:  Relevant Lab Results:   Additional Information HD MWF at [facility]      SSN [id number]  [place]

## 2019-06-12 NOTE — Plan of Care (Signed)
  Problem: Education: Goal: Knowledge of General Education information will improve Description Including pain rating scale, medication(s)/side effects and non-pharmacologic comfort measures Outcome: Progressing   

## 2019-06-13 ENCOUNTER — Encounter: Payer: Self-pay | Admitting: *Deleted

## 2019-06-13 ENCOUNTER — Inpatient Hospital Stay (HOSPITAL_COMMUNITY): Payer: Medicare HMO

## 2019-06-13 ENCOUNTER — Inpatient Hospital Stay (HOSPITAL_COMMUNITY)
Admission: EM | Disposition: A | Discharge status: Skilled Nursing Facility | Payer: Self-pay | Source: Home / Self Care | Attending: Internal Medicine

## 2019-06-13 DIAGNOSIS — S72002A Fracture of unspecified part of neck of left femur, initial encounter for closed fracture: Secondary | ICD-10-CM | POA: Diagnosis not present

## 2019-06-13 HISTORY — PX: PERIPHERAL VASCULAR BALLOON ANGIOPLASTY: CATH118281

## 2019-06-13 HISTORY — PX: A/V FISTULAGRAM: CATH118298

## 2019-06-13 LAB — CULTURE, BLOOD (ROUTINE X 2)
Culture: NO GROWTH
Special Requests: ADEQUATE

## 2019-06-13 LAB — BASIC METABOLIC PANEL
Anion gap: 16 — ABNORMAL HIGH (ref 5–15)
BUN: 47 mg/dL — ABNORMAL HIGH (ref 8–23)
CO2: 25 mmol/L (ref 22–32)
Calcium: 8 mg/dL — ABNORMAL LOW (ref 8.9–10.3)
Chloride: 92 mmol/L — ABNORMAL LOW (ref 98–111)
Creatinine, Ser: 6.48 mg/dL — ABNORMAL HIGH (ref 0.44–1.00)
GFR calc Af Amer: 7 mL/min — ABNORMAL LOW (ref >60)
GFR calc non Af Amer: 6 mL/min — ABNORMAL LOW (ref >60)
Glucose, Bld: 114 mg/dL — ABNORMAL HIGH (ref 70–99)
Potassium: 3.8 mmol/L (ref 3.5–5.1)
Sodium: 133 mmol/L — ABNORMAL LOW (ref 135–145)

## 2019-06-13 LAB — CBC
HCT: 28.7 % — ABNORMAL LOW (ref 36.0–46.0)
Hemoglobin: 9.2 g/dL — ABNORMAL LOW (ref 12.0–15.0)
MCH: 31 pg (ref 26.0–34.0)
MCHC: 32.1 g/dL (ref 30.0–36.0)
MCV: 96.6 fL (ref 80.0–100.0)
Platelets: 189 10*3/uL (ref 150–400)
RBC: 2.97 MIL/uL — ABNORMAL LOW (ref 3.87–5.11)
RDW: 17.3 % — ABNORMAL HIGH (ref 11.5–15.5)
WBC: 9.8 10*3/uL (ref 4.0–10.5)
nRBC: 0 % (ref 0.0–0.2)

## 2019-06-13 LAB — GLUCOSE, CAPILLARY
Glucose-Capillary: 109 mg/dL — ABNORMAL HIGH (ref 70–99)
Glucose-Capillary: 122 mg/dL — ABNORMAL HIGH (ref 70–99)

## 2019-06-13 IMAGING — CT CT HEAD W/O CM
3 series · 16 of 47 positions shown, 19 images · non-contrast
Comparison: Prior head CT from [DATE].

CLINICAL DATA: Initial evaluation for acute head trauma, fall,
weakness.

EXAM:
CT HEAD WITHOUT CONTRAST
TECHNIQUE: Contiguous axial images were obtained from the base of the skull
through the vertex without intravenous contrast.

[Series 3: head 5.0 h30s · axial · 0.42mm/px · z∈[-166,-26]mm · 10 of 34 slices shown, 13 images]
[im 3/34  brain]
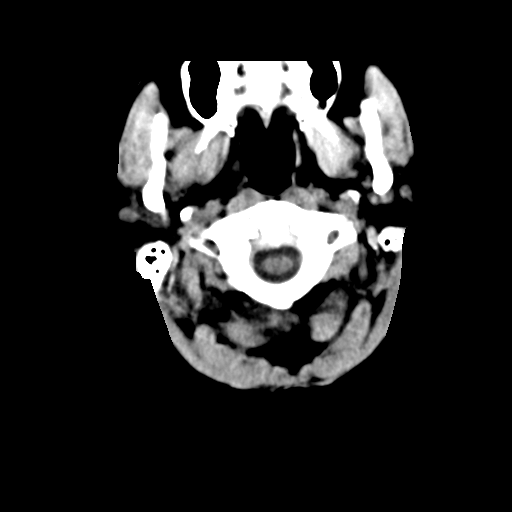
[im 3/34  bone]
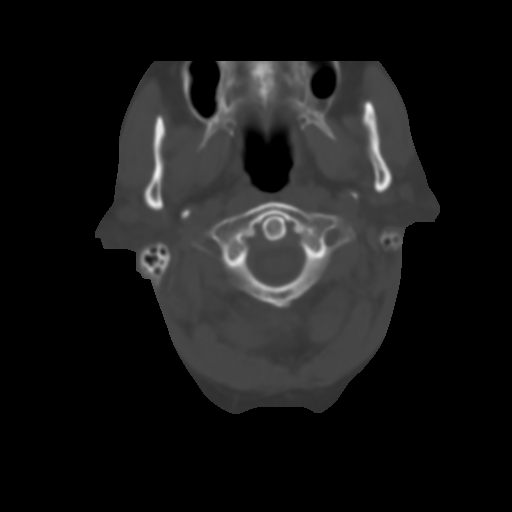
[im 6/34  brain]
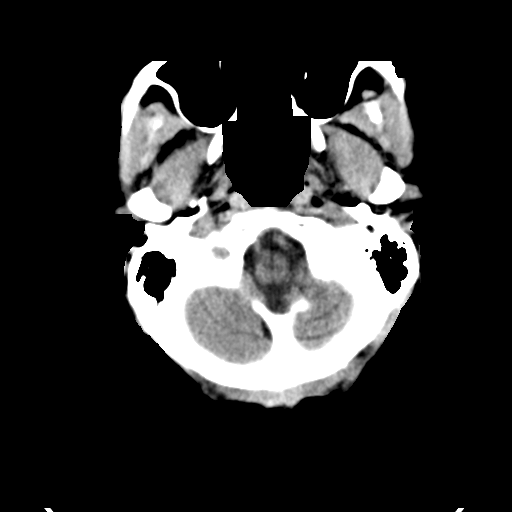
[im 10/34  brain]
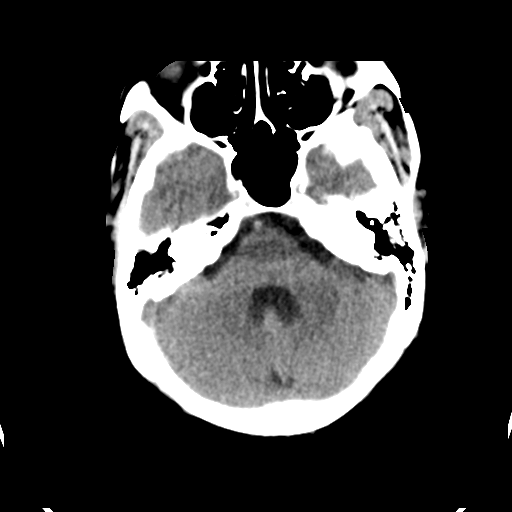
[im 12/34  brain]
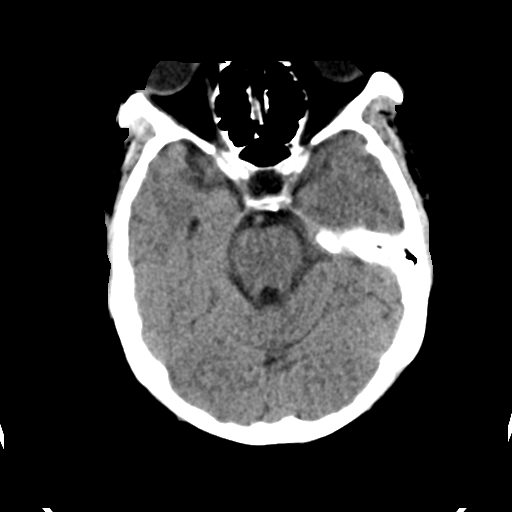
[im 15/34  brain]
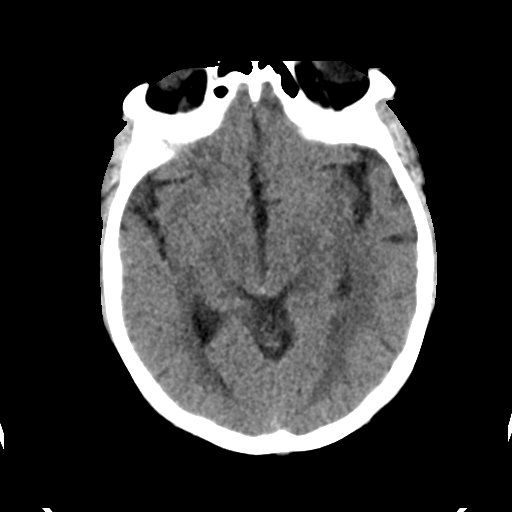
[im 15/34  bone]
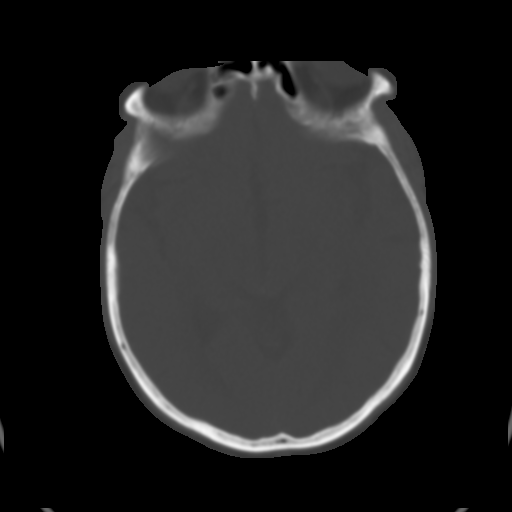
[im 19/34  brain]
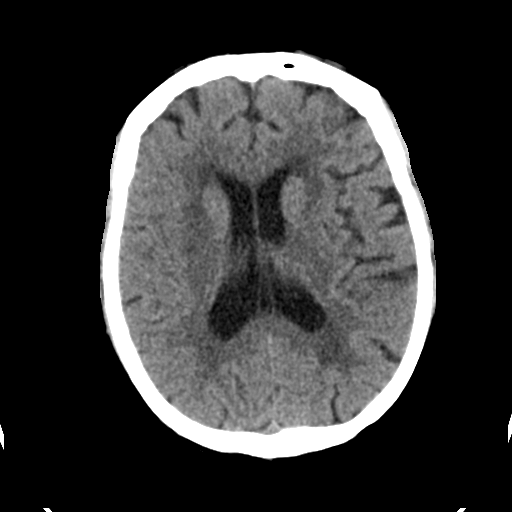
[im 22/34  brain]
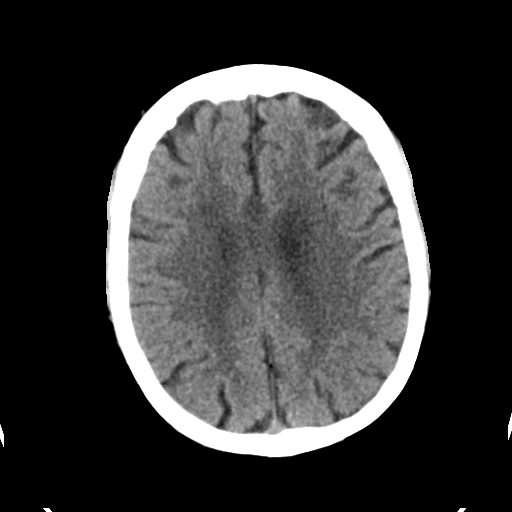
[im 26/34  brain]
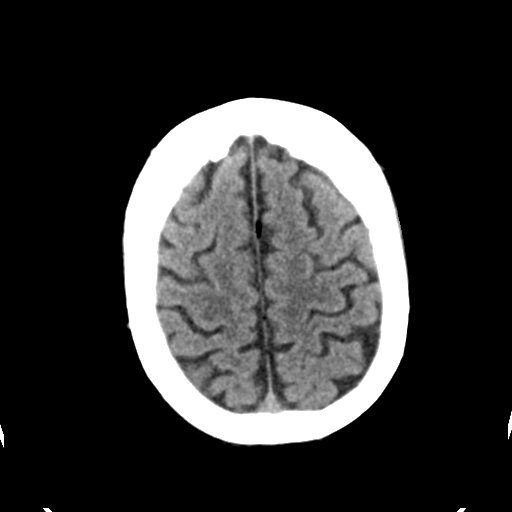
[im 28/34  brain]
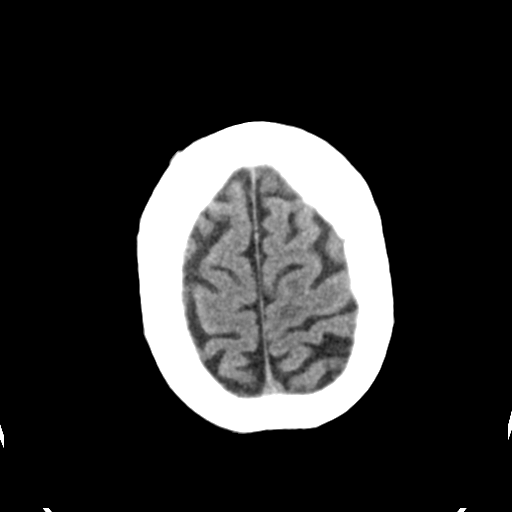
[im 28/34  bone]
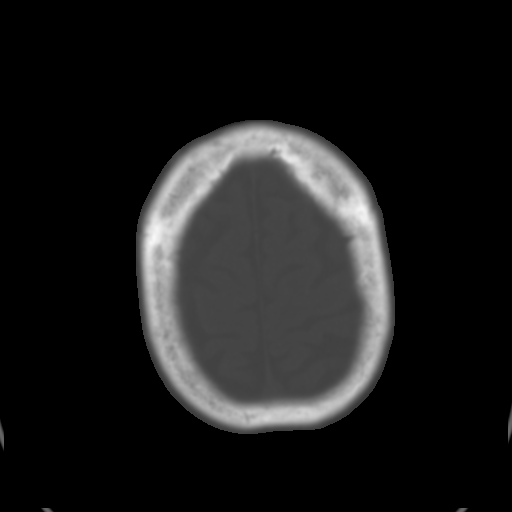
[im 31/34  brain]
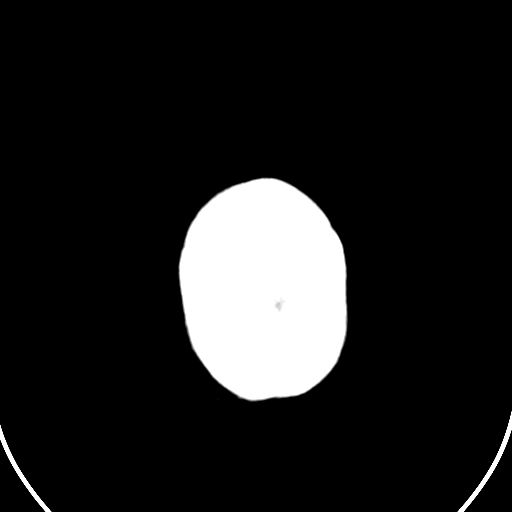

[Series 5: head 3.0 mpr cor · coronal · 0.33mm/px · 3 of 72 slices shown]
[im 24/72  brain]
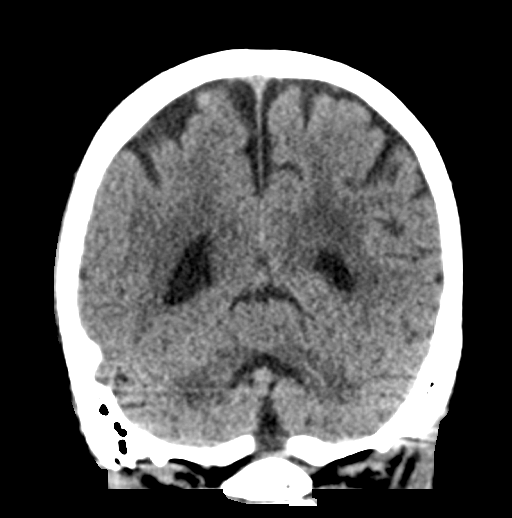
[im 32/72  brain]
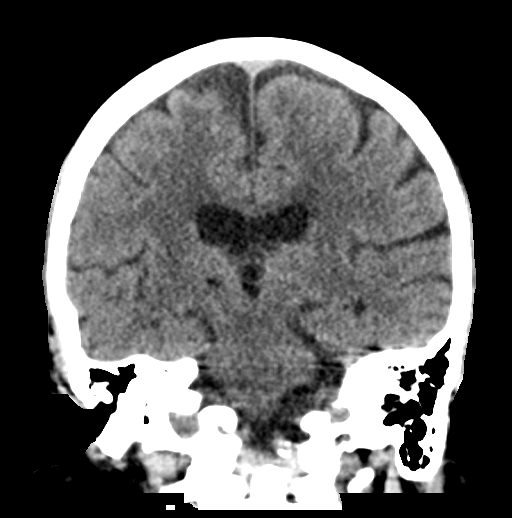
[im 40/72  brain]
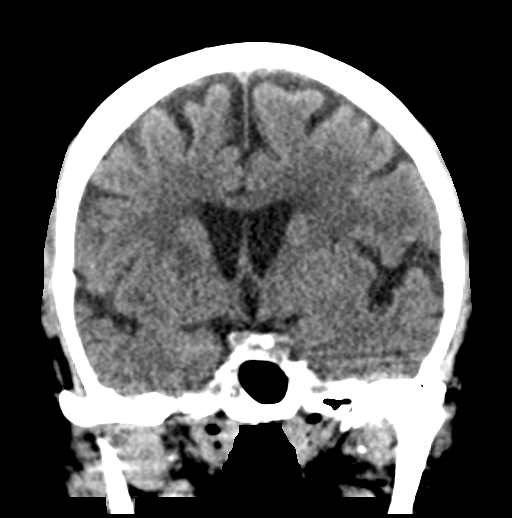

[Series 6: head 3.0 mpr sag · sagittal · 0.33mm/px · 3 of 65 slices shown]
[im 22/65  brain]
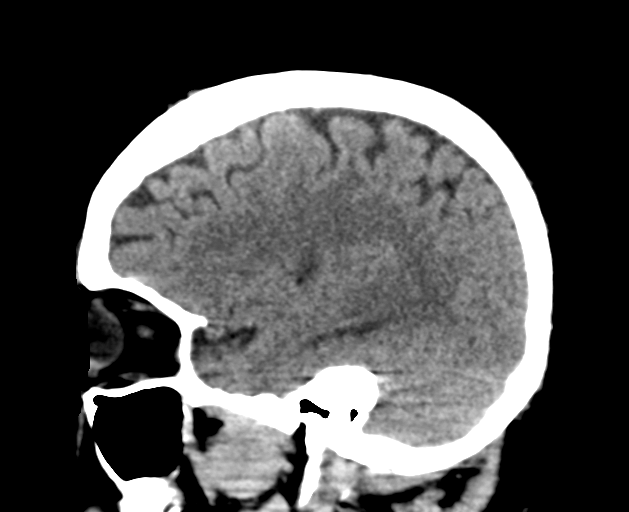
[im 33/65  brain]
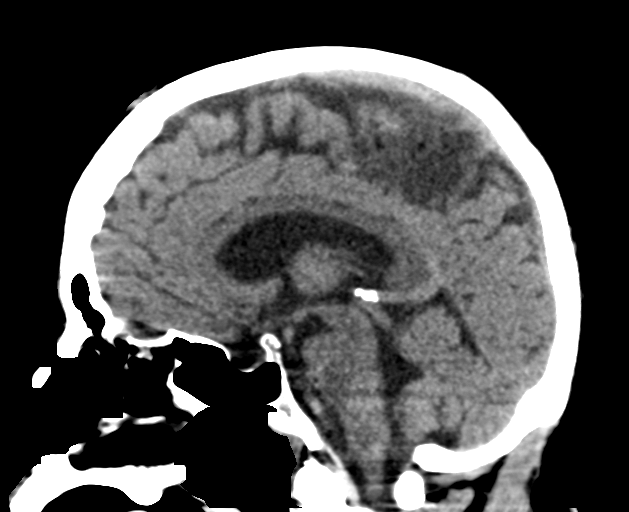
[im 43/65  brain]
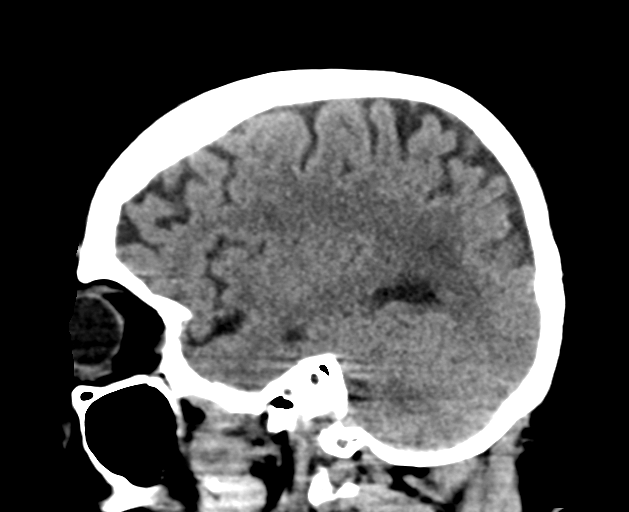

[16 of 47 positions shown; findings below may reference images not displayed]

FINDINGS: Brain: Generalized age-related cerebral atrophy with chronic small
vessel ischemic disease, stable. Scattered remote lacunar infarcts
present within the bilateral basal ganglia and thalami. No acute
intracranial hemorrhage. No acute large vessel territory infarct. No
mass lesion, midline shift or mass effect. No hydrocephalus or
extra-axial fluid collection.

Vascular: No hyperdense vessel. Calcified atherosclerosis present at
skull base.

Skull: Scalp soft tissues within normal limits.  Calvarium intact.

Sinuses/Orbits: Globes and orbital soft tissues within normal
limits. Paranasal sinuses are largely clear. No mastoid effusion.

Other: None.
IMPRESSION: 1. Stable head CT.  No acute intracranial abnormality identified.
2. Age-related cerebral atrophy with chronic small vessel ischemic
disease, with scattered remote lacunar infarcts involving the
bilateral basal ganglia and thalami.

## 2019-06-13 SURGERY — A/V FISTULAGRAM
Anesthesia: LOCAL | Laterality: Left

## 2019-06-13 MED ORDER — LIDOCAINE HCL (PF) 1 % IJ SOLN
INTRAMUSCULAR | Status: DC | PRN
  Administered 2019-06-13: 5 mL

## 2019-06-13 MED ORDER — HYDROCODONE-ACETAMINOPHEN 5-325 MG PO TABS
ORAL_TABLET | ORAL | Status: AC
  Administered 2019-06-13: 1 via ORAL
  Filled 2019-06-13: qty 1

## 2019-06-13 MED ORDER — LIDOCAINE HCL (PF) 1 % IJ SOLN
INTRAMUSCULAR | Status: AC
  Filled 2019-06-13: qty 30

## 2019-06-13 MED ORDER — HEPARIN (PORCINE) IN NACL 1000-0.9 UT/500ML-% IV SOLN
INTRAVENOUS | Status: DC | PRN
  Administered 2019-06-13: 500 mL

## 2019-06-13 MED ORDER — HEPARIN SODIUM (PORCINE) 1000 UNIT/ML IJ SOLN
INTRAMUSCULAR | Status: DC | PRN
  Administered 2019-06-13: 3000 [IU] via INTRAVENOUS

## 2019-06-13 MED ORDER — QUETIAPINE FUMARATE 25 MG PO TABS
12.5000 mg | ORAL_TABLET | Freq: Every day | ORAL | Status: DC
  Administered 2019-06-13 – 2019-06-15 (×3): 12.5 mg via ORAL
  Filled 2019-06-13 (×3): qty 1

## 2019-06-13 MED ORDER — IODIXANOL 320 MG/ML IV SOLN
INTRAVENOUS | Status: DC | PRN
  Administered 2019-06-13: 50 mL

## 2019-06-13 MED ORDER — HEPARIN (PORCINE) IN NACL 1000-0.9 UT/500ML-% IV SOLN
INTRAVENOUS | Status: AC
  Filled 2019-06-13: qty 500

## 2019-06-13 MED ORDER — HEPARIN SODIUM (PORCINE) 1000 UNIT/ML IJ SOLN
INTRAMUSCULAR | Status: AC
  Filled 2019-06-13: qty 1

## 2019-06-13 MED ORDER — HEPARIN SODIUM (PORCINE) 1000 UNIT/ML IJ SOLN
INTRAMUSCULAR | Status: AC
  Filled 2019-06-13: qty 3

## 2019-06-13 MED ORDER — HYDROCODONE-ACETAMINOPHEN 5-325 MG PO TABS: 1.0000 | ORAL_TABLET | Freq: Once | ORAL | Status: AC

## 2019-06-13 SURGICAL SUPPLY — 23 items
BAG SNAP BAND KOVER 36X36 (MISCELLANEOUS) ×2 IMPLANT
BALLN MUSTANG 6.0X40 75 (BALLOONS) ×2
BALLN STERLING OTW 4X40X135 (BALLOONS) ×2
BALLOON MUSTANG 6.0X40 75 (BALLOONS) IMPLANT
BALLOON STERLING OTW 4X40X135 (BALLOONS) IMPLANT
COVER DOME SNAP 22 D (MISCELLANEOUS) ×2 IMPLANT
DCB RANGER 5.0X60 135 (BALLOONS) IMPLANT
DCB RANGER 7.0X40 135 (BALLOONS) IMPLANT
KIT ENCORE 26 ADVANTAGE (KITS) ×1 IMPLANT
KIT MICROPUNCTURE NIT STIFF (SHEATH) ×1 IMPLANT
PROTECTION STATION PRESSURIZED (MISCELLANEOUS) ×2
RANGER DCB 5.0X60 135 (BALLOONS) ×2
RANGER DCB 7.0X40 135 (BALLOONS) ×2
SHEATH PINNACLE R/O II 5F 6CM (SHEATH) ×1 IMPLANT
SHEATH PINNACLE R/O II 7F 4CM (SHEATH) ×1 IMPLANT
SHEATH PROBE COVER 6X72 (BAG) ×3 IMPLANT
STATION PROTECTION PRESSURIZED (MISCELLANEOUS) ×1 IMPLANT
STOPCOCK MORSE 400PSI 3WAY (MISCELLANEOUS) ×2 IMPLANT
TRAY PV CATH (CUSTOM PROCEDURE TRAY) ×2 IMPLANT
TUBING CIL FLEX 10 FLL-RA (TUBING) ×2 IMPLANT
WIRE BENTSON .035X145CM (WIRE) ×1 IMPLANT
WIRE G V18X300CM (WIRE) ×1 IMPLANT
WIRE TORQFLEX AUST .018X40CM (WIRE) ×1 IMPLANT

## 2019-06-13 NOTE — Progress Notes (Signed)
Patient had an unwitnessed fall after she asked this nurse to step outside for privacy after helping her to the bedside commode stating that "I need my dignity respected". This nurse advised patient that she is high fall risk because she was admitted as a result of a fall and have had a hip surgery recently but insisted that she needs her privacy. Patient was lucid at this time and was instructed to use call bell. Upon assessment patient was able to answer all questions and follow commands. Vital signs stable. No physical injuries noted an patient denied pain. [place],NP notified and orders received in Epic. Son, [place] notified. Will continued to monitor.   Khalaya Mcgurn,RN.

## 2019-06-13 NOTE — Anesthesia Postprocedure Evaluation (Signed)
Anesthesia Post Note  Patient: Nykerria Macconnell  Procedure(s) Performed: TOTAL HIP ARTHROPLASTY ANTERIOR APPROACH (Left Hip)     Patient location during evaluation: PACU Anesthesia Type: MAC and Spinal Level of consciousness: awake and alert Pain management: pain level controlled Vital Signs Assessment: post-procedure vital signs reviewed and stable Respiratory status: spontaneous breathing, nonlabored ventilation and respiratory function stable Cardiovascular status: blood pressure returned to baseline and stable Postop Assessment: no apparent nausea or vomiting, no headache, no backache and spinal receding Anesthetic complications: no    Last Vitals:  Vitals:   06/13/19 0349 06/13/19 0454  BP: 129/67 123/72  Pulse: 73 68  Resp: 20 18  Temp: 36.8 C 36.7 C  SpO2: 99% 97%    Last Pain:  Vitals:   06/13/19 0454  TempSrc: Oral  PainSc:                  Pervis Hocking

## 2019-06-13 NOTE — Progress Notes (Signed)
PT Cancellation Note  Patient Details Name: Bianca Myers MRN: [id number] DOB: 31-Dec-1945   Cancelled Treatment:    Reason Eval/Treat Not Completed: Patient at procedure or test/unavailable   Currently in Cath Lab;   Noted fall last night resulting in R pubic ramus fx;  Discussed pt status with Dr. Candiss Norse, who indicated she will be WBAT RLE (as well as LLE), and we can continue PT efforts as tolerated;   Will continue to follow,  Roney Marion, PT  Acute Rehabilitation Services Pager [phone] Office [phone]    Colletta [place] 06/13/2019, 2:27 PM

## 2019-06-13 NOTE — TOC Progression Note (Signed)
Transition of Care [facility]) - Progression Note    Patient Details  Name: Bianca Myers MRN: [id number] Date of Birth: 02/24/1946  [facility]) CM/SW Contact  Sharlet Salina Mila Homer, LCSW Phone Number: 06/13/2019, 4:47 PM  Clinical Narrative:  Talked with Cam Hai at the bedside and bed offer provided: [facility].  Mr. Ouida Sills advised that [facility] (did not respond in Epic) was contacted and is out of network and contact was made with [facility] (did not respond in Epic) and message left. Son did not like the ratings as shown in [email or web address], but understands that [facility] is the only facility that has offered a rehab bed and he will talk with them regarding his concerns and inquire about their current visitation policy. CSW talked with Margaretmary Lombard, care transitions nurse for [facility] [phone]) before talking with son regarding patient and the son's concerns, and she gave permission for her phone number to be given to Mr. Anderson.  MD contacted regarding patient's discharge and the need for a COVID test and he responded that patient will d/c on Wednesday. CSW advised MD that if the test results on Tuesday and patient discharges on Wed., it will be fine.      Expected Discharge Plan: [place] Barriers to Discharge: Continued Medical Work up, Ship broker, Environmental education officer)  Expected Discharge Plan and Services Expected Discharge Plan: [place] arrangements for the past 2 months: Single Family Home                                       Social Determinants of Health (SDOH) Interventions    Readmission Risk Interventions No flowsheet data found.

## 2019-06-13 NOTE — Progress Notes (Signed)
I was asked to see Ms. Bianca Myers in consult for a second assessment related to her AMS. Upon arrival, Ms. Bianca Myers was easily arousable but not fully awake following a seroquel dose. She also has asterixis. Oriented x person, place and time. No focal neuro deficits. She is generally weak and with bilateral hip surgery/fxs there was some limitation to her exam but she could extend both lower extremities with equal strength.

## 2019-06-13 NOTE — Plan of Care (Signed)
  Problem: Education: Goal: Knowledge of General Education information will improve Description: Including pain rating scale, medication(s)/side effects and non-pharmacologic comfort measures Outcome: Progressing   Problem: Safety: Goal: Ability to remain free from injury will improve Outcome: Progressing   

## 2019-06-13 NOTE — Progress Notes (Signed)
Patient ID: Bianca Myers, female   DOB: [birth year], 74 y.o.   MRN: [id number]  PROGRESS NOTE    Jan Walters  [facility]:[id number] DOB: 04-04-1945 DOA: 06/07/2019 PCP: [name], MD   Brief Narrative:  74 year old female with history of end-stage renal disease on hemodialysis, diabetes mellitus type 2, hypertension, DJD, MSSA bacteremia in January 2021 presented on 06/07/2019 after a fall.  Apparently, she went to her dialysis center and as per schedule on 06/06/2019 but could not get her dialysis done due to issues with her AV fistula, subsequently went back home and then subsequently fell down, unclear if she had a syncopal event.  In the ED, patient was confused with creatinine of 13, potassium of 6.9 and bicarb of 14.  Nephrology was consulted.  She was also found to have left hip fracture for which orthopedics was consulted  Assessment & Plan:  Left hip fracture status post fall - she is s/p left hip fracture surgical repair on 06/10/2019, weightbearing as tolerated, Lovenox for DVT prophylaxis, commence PT OT, may require placement at SNF.   Hyperkalemia, End-stage renal disease on hemodialysis, Uremia with Aacute metabolic encephalopathy to missed dialysis and malfunctioning of her dialysis fistula. She has had temporary right IJ dialysis catheter placed this admission and dialysis has been started, now back on MWF schedule.  Nephrology following.  Mental status improving.  Vascular surgery planning to do left fistulogram on 06/13/2019 if this is not salvageable she will require a permanent tunneled dialysis catheter.  Leukocytosis - Probably reactive.  Improving  Mildly elevated high-sensitivity troponin - No chest pain. Likely secondary to ESRD.  No further work-up needed.  Hypertension - will place on Norvasc and monitor.  Anemia of chronic disease with mild perioperative blood loss related anemia.  She is Jehovah's Witness.  Monitor on oral iron currently no need for  transfusion.  Generalized deconditioning - We will need PT evaluation and may need rehab after left hip surgery. SNF ?[facility]  Constipation and nausea.  Abdominal exam and abdominal x-ray unremarkable, bowel regimen placed, Zofran as needed.  Mild delirium, fall night of 06/12/2019.  Patient and son were clearly advised about hospital-acquired delirium, metabolic encephalopathy, due to her fall she did enter mild right-sided pubic rami fracture, x-ray was discussed with orthopedic surgeon Dr. Rigoberto Noel, supportive care, lowest dose possible possible Seroquel night of 06/13/2019 as with 25 mg she tends to get overtly sedated.  Note on 06/12/2019 while using bedside commode patient had a fall, 2 hours later she got 25 mg of Seroquel with became extremely sleepy, on 06/13/2019 we will try 12.5 p.o. nightly Seroquel so that she can get better sleep.  Discussed with son plan of Seroquel both on 06/12/2019 AM bedside and over the phone on 06/13/2019.    DVT prophylaxis: Heparin>>Lovenox  Code Status: Full  Family Communication: Evette Cristal on 06/11/2019 at [phone] / [phone] - message left at 11:44 AM.  Discussed bedside on 06/12/2019.  Updated again over the phone on 06/13/2019 . Disposition Plan: SNF after bed is available, recent left hip fracture surgical repair.  Likely discharge 06/12/2019 if bed available.  Consultants: Orthopedic/nephrology/vascular surgery  Procedures:   IR guided Right IJ temporary dialysis catheter placement on 06/08/2019  Left hip fracture surgical correction on 06/10/2019 by Dr. Erlinda Hong  Antimicrobials:  None   Subjective:  Patient in bed, appears comfortable, denies any headache, no fever, no chest pain or pressure, no shortness of breath , no abdominal pain. No focal weakness. Mildly confused.  Objective: Vitals:   06/13/19 0245 06/13/19 0349 06/13/19 0454 06/13/19 0936  BP: 119/66 129/67 123/72 (!) 147/75  Pulse: 69 73 68 74  Resp: 18 20 18 19   Temp:  98.3 F (36.8 C)  98.1 F (36.7 C) 98.3 F (36.8 C)  TempSrc: Oral Oral Oral   SpO2: 96% 99% 97% 99%  Weight:      Height:        Intake/Output Summary (Last 24 hours) at 06/13/2019 1227 Last data filed at 06/13/2019 0813 Gross per 24 hour  Intake 90 ml  Output 0 ml  Net 90 ml   Filed Weights   06/09/19 1715 06/10/19 1441 06/11/19 2040  Weight: 77 kg 77 kg 78.7 kg    Examination:  Awake mildly confused, No new F.N deficits, Normal affect [place].AT,PERRAL Supple Neck,No JVD, No cervical lymphadenopathy appriciated.  Symmetrical Chest wall movement, Good air movement bilaterally, CTAB RRR,No Gallops, Rubs or new Murmurs, No Parasternal Heave +ve B.Sounds, Abd Soft, No tenderness, No organomegaly appriciated, No rebound - guarding or rigidity. Left hip postop site stable under bandage, right IJ temporary catheter.   Data Reviewed: I have personally reviewed following labs and imaging studies  CBC: Recent Labs  Lab 06/07/19 1116 06/08/19 0520 06/08/19 1915 06/09/19 0336 06/11/19 0316 06/12/19 0416 06/13/19 0338  WBC 16.1*   < > 12.0* 10.8* 9.5 9.0 9.8  NEUTROABS 14.3*  --  9.9* 8.6* 7.0  --   --   HGB 12.5   < > 13.2 11.9* 10.1* 10.1* 9.2*  HCT 40.8   < > 41.0 36.7 31.9* 31.4* 28.7*  MCV 103.6*   < > 97.9 97.9 98.5 97.2 96.6  PLT 286   < > 290 283 195 186 189   < > = values in this interval not displayed.   Basic Metabolic Panel: Recent Labs  Lab 06/07/19 1725 06/08/19 0520 06/08/19 1551 06/09/19 0336 06/11/19 0316 06/12/19 0416 06/13/19 0338  NA  --    < > 135 137 133* 137 133*  K  --    < > 5.4* 4.1 4.4 3.4* 3.8  CL  --    < > 94* 92* 91* 97* 92*  CO2  --    < > 14* 21* 23 27 25   GLUCOSE  --    < > 144* 143* 132* 119* 114*  BUN  --    < > 44* 58* 58* 24* 47*  CREATININE  --    < > 7.63* 9.07* 7.63* 4.57* 6.48*  CALCIUM  --    < > 8.8* 8.6* 8.3* 8.0* 8.0*  MG 2.2  --   --   --  2.0  --   --   PHOS  --   --   --   --  9.1*  --   --    < > = values in this interval not  displayed.   GFR: Estimated Creatinine Clearance: 8.3 mL/min (A) (by C-G formula based on SCr of 6.48 mg/dL (H)). Liver Function Tests: Recent Labs  Lab 06/07/19 1116 06/08/19 1551  AST 29 55*  ALT <5 <5  ALKPHOS 84 77  BILITOT 0.9 1.1  PROT 7.9 8.0  ALBUMIN 3.4* 3.5   Recent Labs  Lab 06/07/19 1116  LIPASE 29   Recent Labs  Lab 06/08/19 1551  AMMONIA 37*   Coagulation Profile: Recent Labs  Lab 06/08/19 0603  INR 1.3*   Cardiac Enzymes: No results for input(s): CKTOTAL, CKMB, CKMBINDEX, TROPONINI in the  last 168 hours. BNP (last 3 results) No results for input(s): PROBNP in the last 8760 hours. HbA1C: No results for input(s): HGBA1C in the last 72 hours. CBG: Recent Labs  Lab 06/09/19 0749 06/10/19 0104 06/10/19 1724 06/13/19 0739 06/13/19 1118  GLUCAP 143* 272* 116* 109* 122*   Lipid Profile: No results for input(s): CHOL, HDL, LDLCALC, TRIG, CHOLHDL, LDLDIRECT in the last 72 hours. Thyroid Function Tests: No results for input(s): TSH, T4TOTAL, FREET4, T3FREE, THYROIDAB in the last 72 hours. Anemia Panel: No results for input(s): VITAMINB12, FOLATE, FERRITIN, TIBC, IRON, RETICCTPCT in the last 72 hours. Sepsis Labs: Recent Labs  Lab 06/07/19 1149 06/07/19 1309  LATICACIDVEN 1.2 1.2    Recent Results (from the past 240 hour(s))  Respiratory Panel by RT PCR (Flu A&B, Covid) - Nasopharyngeal Swab     Status: None   Collection Time: 06/07/19  1:03 PM   Specimen: Nasopharyngeal Swab  Result Value Ref Range Status   SARS Coronavirus 2 by RT PCR NEGATIVE NEGATIVE Final    Comment: (NOTE) SARS-CoV-2 target nucleic acids are NOT DETECTED. The SARS-CoV-2 RNA is generally detectable in upper respiratoy specimens during the acute phase of infection. The lowest concentration of SARS-CoV-2 viral copies this assay can detect is 131 copies/mL. A negative result does not preclude SARS-Cov-2 infection and should not be used as the sole basis for treatment  or other patient management decisions. A negative result may occur with  improper specimen collection/handling, submission of specimen other than nasopharyngeal swab, presence of viral mutation(s) within the areas targeted by this assay, and inadequate number of viral copies (<131 copies/mL). A negative result must be combined with clinical observations, patient history, and epidemiological information. The expected result is Negative. Fact Sheet for Patients:  PinkCheek.be Fact Sheet for Healthcare Providers:  GravelBags.it This test is not yet ap proved or cleared by the [place] FDA and  has been authorized for detection and/or diagnosis of SARS-CoV-2 by FDA under an Emergency Use Authorization (EUA). This EUA will remain  in effect (meaning this test can be used) for the duration of the COVID-19 declaration under Section 564(b)(1) of the Act, 21 U.S.C. section 360bbb-3(b)(1), unless the authorization is terminated or revoked sooner.    Influenza A by PCR NEGATIVE NEGATIVE Final   Influenza B by PCR NEGATIVE NEGATIVE Final    Comment: (NOTE) The Xpert Xpress SARS-CoV-2/FLU/RSV assay is intended as an aid in  the diagnosis of influenza from Nasopharyngeal swab specimens and  should not be used as a sole basis for treatment. Nasal washings and  aspirates are unacceptable for Xpert Xpress SARS-CoV-2/FLU/RSV  testing. Fact Sheet for Patients: PinkCheek.be Fact Sheet for Healthcare Providers: GravelBags.it This test is not yet approved or cleared by the [place] FDA and  has been authorized for detection and/or diagnosis of SARS-CoV-2 by  FDA under an Emergency Use Authorization (EUA). This EUA will remain  in effect (meaning this test can be used) for the duration of the  Covid-19 declaration under Section 564(b)(1) of the Act, 21  U.S.C. section  360bbb-3(b)(1), unless the authorization is  terminated or revoked. Performed at [facility], [place] [address], [place], [place] [address]   Culture, blood (routine x 2)     Status: None   Collection Time: 06/07/19  6:48 PM   Specimen: BLOOD  Result Value Ref Range Status   Specimen Description BLOOD RIGHT ANTECUBITAL  Final   Special Requests   Final    BOTTLES DRAWN  AEROBIC AND ANAEROBIC Blood Culture adequate volume   Culture   Final    NO GROWTH 5 DAYS Performed at [facility], [place] [address] [place], [place] [address]    Report Status 06/12/2019 FINAL  Final  Culture, blood (routine x 2)     Status: None   Collection Time: 06/07/19 11:04 PM   Specimen: BLOOD  Result Value Ref Range Status   Specimen Description BLOOD RIGHT ANTECUBITAL  Final   Special Requests   Final    BOTTLES DRAWN AEROBIC AND ANAEROBIC Blood Culture adequate volume   Culture   Final    NO GROWTH 5 DAYS Performed at [facility], [place] [address], [place], [place] [address]    Report Status 06/13/2019 FINAL  Final  Surgical pcr screen     Status: None   Collection Time: 06/09/19  6:43 AM   Specimen: Nasal Mucosa; Nasal Swab  Result Value Ref Range Status   MRSA, PCR NEGATIVE NEGATIVE Final   Staphylococcus aureus NEGATIVE NEGATIVE Final    Comment: (NOTE) The Xpert SA Assay (FDA approved for NASAL specimens in patients 37 years of age and older), is one component of a comprehensive surveillance program. It is not intended to diagnose infection nor to guide or monitor treatment. Performed at [facility], [place] [address], [place], [place] [address]      Radiology Studies:  DG Shoulder Right  Result Date: 06/12/2019 CLINICAL DATA:  Fall EXAM: RIGHT SHOULDER - 2+ VIEW COMPARISON:  04/05/2019 FINDINGS: Moderate AC joint degenerative change. Mild glenohumeral degenerative change. No fracture or dislocation. High-riding appearance of the humeral head with effacement of  subacromial space. Right-sided central venous catheter tip over the right atrium. Fibrosis and bronchiectasis within the right lung apex IMPRESSION: 1. No acute osseous abnormality. 2. Degenerative changes of the [facility] joint and glenohumeral joint 3. High-riding humeral head consistent with rotator cuff disease 4. Fibrosis and bronchiectasis in the right apex Electronically Signed   By: Donavan Foil M.D.   On: 06/12/2019 23:41   CT HEAD WO CONTRAST  Result Date: 06/13/2019 CLINICAL DATA:  Initial evaluation for acute head trauma, fall, weakness. EXAM: CT HEAD WITHOUT CONTRAST TECHNIQUE: Contiguous axial images were obtained from the base of the skull through the vertex without intravenous contrast. COMPARISON:  Prior head CT from 06/07/2019. FINDINGS: Brain: Generalized age-related cerebral atrophy with chronic small vessel ischemic disease, stable. Scattered remote lacunar infarcts present within the bilateral basal ganglia and thalami. No acute intracranial hemorrhage. No acute large vessel territory infarct. No mass lesion, midline shift or mass effect. No hydrocephalus or extra-axial fluid collection. Vascular: No hyperdense vessel. Calcified atherosclerosis present at skull base. Skull: Scalp soft tissues within normal limits.  Calvarium intact. Sinuses/Orbits: Globes and orbital soft tissues within normal limits. Paranasal sinuses are largely clear. No mastoid effusion. Other: None. IMPRESSION: 1. Stable head CT.  No acute intracranial abnormality identified. 2. Age-related cerebral atrophy with chronic small vessel ischemic disease, with scattered remote lacunar infarcts involving the bilateral basal ganglia and thalami. Electronically Signed   By: Jeannine Boga M.D.   On: 06/13/2019 02:14   DG Abd Portable 1V  Result Date: 06/12/2019 CLINICAL DATA:  Nausea and vomiting. EXAM: PORTABLE ABDOMEN - 1 VIEW COMPARISON:  CT 06/07/2019 and pelvis 06/11/2019 FINDINGS: Bowel gas pattern is nonobstructive.  No free peritoneal air. Degenerate change of the spine. Fixation hardware over the right acetabular region intact. Left hip arthroplasty intact. IMPRESSION: Nonobstructive bowel gas  pattern. Electronically Signed   By: Marin Olp M.D.   On: 06/12/2019 10:26   DG HIP UNILAT WITH PELVIS 1V RIGHT  Result Date: 06/12/2019 CLINICAL DATA:  Altered mental status, fall EXAM: DG HIP (WITH OR WITHOUT PELVIS) 1V RIGHT COMPARISON:  06/11/2019, 06/07/2019 FINDINGS: Status post left hip replacement with normal alignment. Gas in the hip soft tissues consistent with recent surgery. Surgical plate and fixating screws in the right acetabulum and inferior pubic ramus. New acute mildly comminuted fracture of the right superior pubic ramus. Calcified uterine fibroid IMPRESSION: 1. New acute mildly comminuted fracture involving the right superior pubic ramus. 2. Stable postsurgical changes of the right acetabulum. 3. Status post left hip replacement with postsurgical changes. Electronically Signed   By: Donavan Foil M.D.   On: 06/12/2019 23:38    Scheduled Meds:  . albuterol  2.5 mg Nebulization Once  . amLODipine  10 mg Oral Daily  . atorvastatin  10 mg Oral QHS  . Chlorhexidine Gluconate Cloth  6 each Topical Q0600  . Chlorhexidine Gluconate Cloth  6 each Topical Q0600  . docusate sodium  100 mg Oral BID  . enoxaparin (LOVENOX) injection  30 mg Subcutaneous Q24H  . escitalopram  10 mg Oral Daily  . famotidine  40 mg Oral BID  . ferric citrate  420 mg Oral TID WC  . heparin  2,000 Units Dialysis Once in dialysis  . labetalol  200 mg Oral BID  . polyethylene glycol  17 g Oral Daily   Continuous Infusions:   Total time spent: 30 minutes  Signature  Lala Lund M.D on 06/13/2019 at 12:27 PM   -  To page go to [email or web address]

## 2019-06-13 NOTE — Progress Notes (Addendum)
[facility] Progress Note   Subjective: Lying in bed, staring into space. Calls me by name, alert and oriented X 3 but affect is not her baseline or even close. Usually gregarious, very talkative. Appears angry, says she feels like "Job".   Objective Vitals:   06/13/19 0245 06/13/19 0349 06/13/19 0454 06/13/19 0936  BP: 119/66 129/67 123/72 (!) 147/75  Pulse: 69 73 68 74  Resp: 18 20 18 19   Temp:  98.3 F (36.8 C) 98.1 F (36.7 C) 98.3 F (36.8 C)  TempSrc: Oral Oral Oral   SpO2: 96% 99% 97% 99%  Weight:      Height:       Physical Exam General: WN, WD elderly female in NAD Heart: S1,S2 RRR Lungs: CTAB Abdomen: S, NT Active BS Extremities: No LE edema Dialysis Access: Temp cath RIJ. L AVF + bruit    Additional Objective Labs: Basic Metabolic Panel: Recent Labs  Lab 06/11/19 0316 06/12/19 0416 06/13/19 0338  NA 133* 137 133*  K 4.4 3.4* 3.8  CL 91* 97* 92*  CO2 23 27 25   GLUCOSE 132* 119* 114*  BUN 58* 24* 47*  CREATININE 7.63* 4.57* 6.48*  CALCIUM 8.3* 8.0* 8.0*  PHOS 9.1*  --   --    Liver Function Tests: Recent Labs  Lab 06/07/19 1116 06/08/19 1551  AST 29 55*  ALT <5 <5  ALKPHOS 84 77  BILITOT 0.9 1.1  PROT 7.9 8.0  ALBUMIN 3.4* 3.5   Recent Labs  Lab 06/07/19 1116  LIPASE 29   CBC: Recent Labs  Lab 06/08/19 1915 06/08/19 1915 06/09/19 0336 06/09/19 0336 06/11/19 0316 06/12/19 0416 06/13/19 0338  WBC 12.0*   < > 10.8*   < > 9.5 9.0 9.8  NEUTROABS 9.9*  --  8.6*  --  7.0  --   --   HGB 13.2   < > 11.9*   < > 10.1* 10.1* 9.2*  HCT 41.0   < > 36.7   < > 31.9* 31.4* 28.7*  MCV 97.9  --  97.9  --  98.5 97.2 96.6  PLT 290   < > 283   < > 195 186 189   < > = values in this interval not displayed.   Blood Culture    Component Value Date/Time   SDES BLOOD RIGHT ANTECUBITAL 06/07/2019 2304   SPECREQUEST  06/07/2019 2304    BOTTLES DRAWN AEROBIC AND ANAEROBIC Blood Culture adequate volume   CULT  06/07/2019 2304    NO  GROWTH 5 DAYS Performed at [facility], [place] [address], [place],  [address]    REPTSTATUS 06/13/2019 FINAL 06/07/2019 2304    Cardiac Enzymes: No results for input(s): CKTOTAL, CKMB, CKMBINDEX, TROPONINI in the last 168 hours. CBG: Recent Labs  Lab 06/08/19 1522 06/09/19 0749 06/10/19 0104 06/10/19 1724 06/13/19 0739  GLUCAP 168* 143* 272* 116* 109*   Iron Studies: No results for input(s): IRON, TIBC, TRANSFERRIN, FERRITIN in the last 72 hours. @lablastinr3 @ Studies/Results: DG Shoulder Right  Result Date: 06/12/2019 CLINICAL DATA:  Fall EXAM: RIGHT SHOULDER - 2+ VIEW COMPARISON:  04/05/2019 FINDINGS: Moderate AC joint degenerative change. Mild glenohumeral degenerative change. No fracture or dislocation. High-riding appearance of the humeral head with effacement of subacromial space. Right-sided central venous catheter tip over the right atrium. Fibrosis and bronchiectasis within the right lung apex IMPRESSION: 1. No acute osseous abnormality. 2. Degenerative changes of the [facility] joint and glenohumeral joint 3. High-riding humeral head consistent  with rotator cuff disease 4. Fibrosis and bronchiectasis in the right apex Electronically Signed   By: Donavan Foil M.D.   On: 06/12/2019 23:41   CT HEAD WO CONTRAST  Result Date: 06/13/2019 CLINICAL DATA:  Initial evaluation for acute head trauma, fall, weakness. EXAM: CT HEAD WITHOUT CONTRAST TECHNIQUE: Contiguous axial images were obtained from the base of the skull through the vertex without intravenous contrast. COMPARISON:  Prior head CT from 06/07/2019. FINDINGS: Brain: Generalized age-related cerebral atrophy with chronic small vessel ischemic disease, stable. Scattered remote lacunar infarcts present within the bilateral basal ganglia and thalami. No acute intracranial hemorrhage. No acute large vessel territory infarct. No mass lesion, midline shift or mass effect. No hydrocephalus or extra-axial fluid collection. Vascular:  No hyperdense vessel. Calcified atherosclerosis present at skull base. Skull: Scalp soft tissues within normal limits.  Calvarium intact. Sinuses/Orbits: Globes and orbital soft tissues within normal limits. Paranasal sinuses are largely clear. No mastoid effusion. Other: None. IMPRESSION: 1. Stable head CT.  No acute intracranial abnormality identified. 2. Age-related cerebral atrophy with chronic small vessel ischemic disease, with scattered remote lacunar infarcts involving the bilateral basal ganglia and thalami. Electronically Signed   By: Jeannine Boga M.D.   On: 06/13/2019 02:14   DG Abd Portable 1V  Result Date: 06/12/2019 CLINICAL DATA:  Nausea and vomiting. EXAM: PORTABLE ABDOMEN - 1 VIEW COMPARISON:  CT 06/07/2019 and pelvis 06/11/2019 FINDINGS: Bowel gas pattern is nonobstructive. No free peritoneal air. Degenerate change of the spine. Fixation hardware over the right acetabular region intact. Left hip arthroplasty intact. IMPRESSION: Nonobstructive bowel gas pattern. Electronically Signed   By: Marin Olp M.D.   On: 06/12/2019 10:26   DG HIP UNILAT WITH PELVIS 1V RIGHT  Result Date: 06/12/2019 CLINICAL DATA:  Altered mental status, fall EXAM: DG HIP (WITH OR WITHOUT PELVIS) 1V RIGHT COMPARISON:  06/11/2019, 06/07/2019 FINDINGS: Status post left hip replacement with normal alignment. Gas in the hip soft tissues consistent with recent surgery. Surgical plate and fixating screws in the right acetabulum and inferior pubic ramus. New acute mildly comminuted fracture of the right superior pubic ramus. Calcified uterine fibroid IMPRESSION: 1. New acute mildly comminuted fracture involving the right superior pubic ramus. 2. Stable postsurgical changes of the right acetabulum. 3. Status post left hip replacement with postsurgical changes. Electronically Signed   By: Donavan Foil M.D.   On: 06/12/2019 23:38   Medications:  . albuterol  2.5 mg Nebulization Once  . amLODipine  10 mg Oral  Daily  . atorvastatin  10 mg Oral QHS  . Chlorhexidine Gluconate Cloth  6 each Topical Q0600  . Chlorhexidine Gluconate Cloth  6 each Topical Q0600  . docusate sodium  100 mg Oral BID  . enoxaparin (LOVENOX) injection  30 mg Subcutaneous Q24H  . escitalopram  10 mg Oral Daily  . famotidine  40 mg Oral BID  . ferric citrate  420 mg Oral TID WC  . heparin  2,000 Units Dialysis Once in dialysis  . labetalol  200 mg Oral BID  . polyethylene glycol  17 g Oral Daily     Dialysis: [facility] MWF 4hr 180NRe 350/800  77kg 2K/2.25Ca UFP 4 AVF  -Heparin 2000 units IV TIW -Mircera 200 mcg IV q 2 weeks (last 3/31)  Assessment/Plan: 1. Confusion/AMS on 4/7. Concern for seizure/uremia.AdmitBUN 120/Cr 14.5.  EEG nonspecific. She is still not at baseline although she is alert and oriented X 3. Appears angry which is not her normal affect. Consider early  dementia.  2. Left femoral neck fracture s/p syncopal event at home. S/P prosthetic replacement for femoral neck fx 4/09/21per Dr. Erlinda Hong.  3. Hyperkalemia -Resolved with HD. 4. ESRD -HD MWF. Missed HD 06/06/19  d/t difficult cannulation of AVF.Unable to cannulate fistulaTuesday. Appreciate IR placement of temp cath4/7.Had serial HD 4/7 and 4/8.D/T elevated SCr/BUN. Resume MWF schedule today.  5. AVF malfunction - ongoing difficulty with cannulation. VVS consulted, planning fistulagram today.  6. Hypertension/volume - BP and volume controlled, UF as tolerated.  Continue amlodipine 5 mg PO daily, labetalol 200 mg PO bid. 7. Anemia - Hgb10.1 No ESA needs.PATIENT IS [employer] WITNESS, DOES NOT ACCEPT BLOOD PRODUCTS.  8. Metabolic bone disease -Continue OP binders, no VDRA.  9. Nutrition - NPO at present. Renal diet/fluid restriction when eating.   Rita H. Brown NP-C 06/13/2019, 11:05 AM  [employer] [phone]  I have seen and examined this patient and agree with plan and assessment in the above note with renal  recommendations/intervention highlighted.  S/p angioplasty of multiple areas of avf.  Will use temporary HD catheter today but will attempt cannulation of avf on Wednesday.   Broadus John A Shondell Fabel,MD 06/13/2019 3:24 PM

## 2019-06-13 NOTE — Progress Notes (Signed)
   06/13/19 0349  What Happened  Was fall witnessed? No  Patients activity before fall bathroom-assisted  Point of contact hip/leg  Was patient injured? Yes  Patient found on floor  Found by Staff-comment (Sammie, RN)  Stated prior activity other (comment) (assisted to the bedside commode )  Follow Up  MD notified Blount, NP  Time MD notified 2045  Family notified Yes - comment  Time family notified 2100  Additional tests Yes-comment  Simple treatment Other (comment)  Progress note created (see row info) Yes  Adult Fall Risk Assessment  Risk Factor Category (scoring not indicated) High fall risk per protocol (document High fall risk)  Age 74  Fall History: Fall within 6 months prior to admission 5  Elimination; Bowel and/or Urine Incontinence 2  Elimination; Bowel and/or Urine Urgency/Frequency 0  Medications: includes PCA/Opiates, Anti-convulsants, Anti-hypertensives, Diuretics, Hypnotics, Laxatives, Sedatives, and Psychotropics 5  Patient Care Equipment 3  Mobility-Assistance 2  Mobility-Gait 2  Mobility-Sensory Deficit 2  Altered awareness of immediate physical environment 1  Impulsiveness 2  Lack of understanding of one's physical/cognitive limitations 0  Total Score 26  Patient Fall Risk Level High fall risk  Adult Fall Risk Interventions  Required Bundle Interventions *See Row Information* High fall risk - low, moderate, and high requirements implemented  Additional Interventions Use of appropriate toileting equipment (bedpan, BSC, etc.)  Screening for Fall Injury Risk (To be completed on HIGH fall risk patients) - Assessing Need for Low Bed  Risk For Fall Injury- Low Bed Criteria Admitted as a result of a fall  Will Implement Low Bed and Floor Mats Yes  Screening for Fall Injury Risk (To be completed on HIGH fall risk patients who do not meet crieteria for Low Bed) - Assessing Need for Floor Mats Only  Risk For Fall Injury- Criteria for Floor Mats Confusion/dementia  (+NuDESC, CIWA, TBI, etc.)  Will Implement Floor Mats Yes  Vitals  Temp 98.3 F (36.8 C)  Temp Source Oral  BP 129/67  MAP (mmHg) 86  BP Location Right Arm  BP Method Automatic  Patient Position (if appropriate) Lying  Pulse Rate 73  Resp 20  Oxygen Therapy  SpO2 99 %  O2 Device Room Air   [employer]

## 2019-06-13 NOTE — Op Note (Signed)
OPERATIVE NOTE   PROCEDURE: 1. left brachiobasilic arteriovenous fistula cannulation under ultrasound guidance 2. left arm fistulogram including central venogram 3. left peripheral mid upper arm venoplasty (6 mm x 40 mm Mustang and 7 mm x 40 mm drug coated Ranger) 4. left peripheral distal upper arm venoplasty at arterial anastomosis (4 mm x 40 mm Sterling and 5 mm x 60 mm drug coated Ranger)    PRE-OPERATIVE DIAGNOSIS: Malfunctioning left arteriovenous fistula  POST-OPERATIVE DIAGNOSIS: same as above   SURGEON: Marty Heck, MD  ANESTHESIA: local  ESTIMATED BLOOD LOSS: 5 cc  FINDING(S): 1. Initially accessed the fistula antegrade and noted a 60% stenosis in the mid upper arm basilic vein fistula that was treated with a 6 mm Mustang and then 7 mm drug-coated Ranger.  On retrograde reflux shot there was a second near 95% stenosis in the distal upper arm near the arterial anastomosis.  Subsequently accessed the fistula retrograde through a second sheath and this was angioplastied across the arterial anastomosis with a 4 mm Sterling and then 5 mm drug-coated Ranger.  Excellent thrill upon completion with no residual stenosis.    SPECIMEN(S):  None  CONTRAST: 50 cc  INDICATIONS: Mei Suits is a 74 y.o. female who presents with malfunctioning left brachiobasilic arteriovenous fistula.  The patient is scheduled for left arm fistulogram.  The patient is aware the risks include but are not limited to: bleeding, infection, thrombosis of the cannulated access, and possible anaphylactic reaction to the contrast.  The patient is aware of the risks of the procedure and elects to proceed forward.  DESCRIPTION: After full informed written consent was obtained, the patient was brought back to the angiography suite and placed supine upon the angiography table.  The patient was connected to monitoring equipment.  The left arm was prepped and draped in the standard fashion for a left  arm fistulogram.  Under ultrasound guidance, the left arteriovenous fistula was evaluated, it was patent, an image was saved.  It was cannulated with a micropuncture needle.  The microwire was advanced into the fistula and the needle was exchanged for the a microsheath, which was lodged 2 cm into the access.  The wire was removed and the sheath was connected to the IV extension tubing.  Hand injections were completed to image the access from the antecubitum up to the level of axilla.  The central venous structures were also imaged by hand injections.  Patient was given 3000 units of IV heparin.  We initially elected to intervene on a mid upper arm stenosis approximately 60% just distal to the existing stent.  We crossed this with a Bentson wire and placed a short 7 [place] sheath.  We then angioplastied this with a 6 mm x 40 mm Mustang and then 7 mm x 40 mm drug-coated Ranger.  During inflation we got a retrograde shot that showed a second lesion in the distal upper arm near the arterial anastomosis.  We then removed the sheath and a 4-0 pursestring was tied down.  I then used ultrasound and reaccessed the fistula retrograde toward the arterial anastomosis with a microwire and placed a micro sheath.  I then advanced a V 18 wire across the arterial anastomosis up the brachial artery retrograde.  I then placed a short 5 [place] sheath.  I then angioplastied the distal upper arm basilic near the arterial anastomosis with a 4 mm Sterling and then a 5 mm drug-coated Ranger.  I then shot a final retrograde  shot that showed no residual stenosis.  Patient had an excellent thrill.  Wire was removed.  A 4-0 Monocryl pursestring was then tied down and sheath was removed.  COMPLICATIONS: None  CONDITION: Stable  Marty Heck, MD [facility]: [phone]  06/13/2019 2:44 PM

## 2019-06-14 DIAGNOSIS — S72002A Fracture of unspecified part of neck of left femur, initial encounter for closed fracture: Secondary | ICD-10-CM | POA: Diagnosis not present

## 2019-06-14 LAB — BASIC METABOLIC PANEL
Anion gap: 13 (ref 5–15)
BUN: 26 mg/dL — ABNORMAL HIGH (ref 8–23)
CO2: 25 mmol/L (ref 22–32)
Calcium: 7.9 mg/dL — ABNORMAL LOW (ref 8.9–10.3)
Chloride: 96 mmol/L — ABNORMAL LOW (ref 98–111)
Creatinine, Ser: 4.2 mg/dL — ABNORMAL HIGH (ref 0.44–1.00)
GFR calc Af Amer: 11 mL/min — ABNORMAL LOW (ref >60)
GFR calc non Af Amer: 10 mL/min — ABNORMAL LOW (ref >60)
Glucose, Bld: 118 mg/dL — ABNORMAL HIGH (ref 70–99)
Potassium: 4 mmol/L (ref 3.5–5.1)
Sodium: 134 mmol/L — ABNORMAL LOW (ref 135–145)

## 2019-06-14 LAB — CBC
HCT: 30.6 % — ABNORMAL LOW (ref 36.0–46.0)
Hemoglobin: 9.6 g/dL — ABNORMAL LOW (ref 12.0–15.0)
MCH: 30.3 pg (ref 26.0–34.0)
MCHC: 31.4 g/dL (ref 30.0–36.0)
MCV: 96.5 fL (ref 80.0–100.0)
Platelets: 217 10*3/uL (ref 150–400)
RBC: 3.17 MIL/uL — ABNORMAL LOW (ref 3.87–5.11)
RDW: 17.5 % — ABNORMAL HIGH (ref 11.5–15.5)
WBC: 9.1 10*3/uL (ref 4.0–10.5)
nRBC: 0 % (ref 0.0–0.2)

## 2019-06-14 LAB — SARS CORONAVIRUS 2 (TAT 6-24 HRS): SARS Coronavirus 2: NEGATIVE

## 2019-06-14 LAB — GLUCOSE, CAPILLARY: Glucose-Capillary: 162 mg/dL — ABNORMAL HIGH (ref 70–99)

## 2019-06-14 MED ORDER — FAMOTIDINE 40 MG/5ML PO SUSR
40.0000 mg | Freq: Every day | ORAL | Status: AC
  Administered 2019-06-14 – 2019-06-15 (×2): 40 mg via ORAL
  Filled 2019-06-14 (×2): qty 5

## 2019-06-14 MED FILL — Lidocaine HCl Local Preservative Free (PF) Inj 1%: INTRAMUSCULAR | Qty: 30 | Status: AC

## 2019-06-14 MED FILL — Heparin Sod (Porcine)-NaCl IV Soln 1000 Unit/500ML-0.9%: INTRAVENOUS | Qty: 500 | Status: AC

## 2019-06-14 NOTE — Progress Notes (Signed)
Physical Therapy Treatment Patient Details Name: Bianca Myers MRN: [id number] DOB: 01-15-1946 Today's Date: 06/14/2019    History of Present Illness Bianca Myers is a 74 y.o. female with medical history significant for ESRD on hemodialysis, type 2 diabetes mellitus, hypertension, DJD, history of MSSA bacteremia who presemts to the emergency room after a fall, resulting in a L hip fracture and found to have acute metabolic encephelopathy. S/p anterior hip hemiarthroplasty 06/10/19. Per notes, patient now also has Right pubic rami fracture. WBAT on B LEs.    PT Comments    Patient received in bed. Pleasant, disoriented as to time of day. Patient reports she hurts all over. She is agreeable to work with PT. Patient has terrible pain this morning with all movement in bed. Screaming out with max +2 assist to edge of bed. Patient is able to sit edge of bed x 5 min, demonstrates difficulty scooting due to pain. She is able to perform sit to stand from elevated surface with max +2 assist. Cannot tolerate standing for long (less than 5 seconds) due to pain in B LEs. Patient returned to supine with max +2 assist. She will continue to benefit from skilled PT while here to improve mobility and strength to perform transfers and begin ambulation.     Follow Up Recommendations  SNF     Equipment Recommendations  Other (comment)(TBD at later time)    Recommendations for Other Services       Precautions / Restrictions Precautions Precautions: Fall Restrictions Weight Bearing Restrictions: No LLE Weight Bearing: Weight bearing as tolerated    Mobility  Bed Mobility Overal bed mobility: Needs Assistance Bed Mobility: Sit to Supine;Supine to Sit     Supine to sit: Total assist;+2 for physical assistance Sit to supine: Total assist;+2 for physical assistance   General bed mobility comments: Patient attempts to move but is in too much pain to assist. Requires total assist for bed  mobility  Transfers Overall transfer level: Needs assistance Equipment used: Rolling walker (2 wheeled) Transfers: Sit to/from Stand Sit to Stand: +2 physical assistance;Max assist;From elevated surface         General transfer comment: Patient is able to stand with max assist +2, however cannot tolerate standing for more than 10 seconds due to pain and requests to sit back down.  Ambulation/Gait             General Gait Details: unable   Stairs             Wheelchair Mobility    Modified Rankin (Stroke Patients Only)       Balance Overall balance assessment: Needs assistance Sitting-balance support: Feet supported Sitting balance-Leahy Scale: Good Sitting balance - Comments: pain limited, demonstrates good sitting balance once assisted to proper position   Standing balance support: Bilateral upper extremity supported;During functional activity Standing balance-Leahy Scale: Poor Standing balance comment: reliant on B UE assist at this time for support. Unable to tolerate standing for more than 5 seconds due to pain                            Cognition Arousal/Alertness: Awake/alert Behavior During Therapy: WFL for tasks assessed/performed Overall Cognitive Status: Impaired/Different from baseline Area of Impairment: Orientation;Awareness                 Orientation Level: Disoriented to;Time Current Attention Level: Sustained   Following Commands: Follows one step commands consistently;Follows one step commands with increased  time Safety/Judgement: Decreased awareness of safety;Decreased awareness of deficits Awareness: Emergent   General Comments: Patient disoriented about time of day. Able to tell me month and where she is.      Exercises Total Joint Exercises Ankle Circles/Pumps: AROM;Both;5 reps Heel Slides: AAROM;Left;5 reps Hip ABduction/ADduction: AAROM;5 reps;Left    General Comments        Pertinent Vitals/Pain  Pain Assessment: Faces Faces Pain Scale: Hurts worst Pain Location: Right groin, L LE Pain Descriptors / Indicators: Crying Pain Intervention(s): Monitored during session;Repositioned    Home Living                      Prior Function            PT Goals (current goals can now be found in the care plan section) Acute Rehab PT Goals PT Goal Formulation: With patient Time For Goal Achievement: 06/25/19 Potential to Achieve Goals: Fair Progress towards PT goals: Not progressing toward goals - comment(pain limited this morning)    Frequency    Min 3X/week      PT Plan Current plan remains appropriate    Co-evaluation              AM-PAC PT "6 Clicks" Mobility   Outcome Measure  Help needed turning from your back to your side while in a flat bed without using bedrails?: Total Help needed moving from lying on your back to sitting on the side of a flat bed without using bedrails?: Total Help needed moving to and from a bed to a chair (including a wheelchair)?: Total Help needed standing up from a chair using your arms (e.g., wheelchair or bedside chair)?: Total Help needed to walk in hospital room?: Total Help needed climbing 3-5 steps with a railing? : Total 6 Click Score: 6    End of Session Equipment Utilized During Treatment: Gait belt Activity Tolerance: Patient limited by pain Patient left: in bed;with call bell/phone within reach;with bed alarm set Nurse Communication: Mobility status PT Visit Diagnosis: Other abnormalities of gait and mobility (R26.89);Muscle weakness (generalized) (M62.81);Pain;History of falling (Z91.81) Pain - Right/Left: (bilateral) Pain - part of body: Leg;Hip     Time: 0938-1829 PT Time Calculation (min) (ACUTE ONLY): 20 min  Charges:  $Therapeutic Activity: 8-22 mins                     [employer], PT, G[facility] 06/14/19,10:02 AM

## 2019-06-14 NOTE — Progress Notes (Signed)
Patient ID: Bianca Myers, female   DOB: 1945/09/03, 74 y.o.   MRN: [id number]  PROGRESS NOTE    Bianca Myers  DDU:[id number] DOB: 02-14-1946 DOA: 06/07/2019 PCP: [name], MD   Brief Narrative:  74 year old female with history of end-stage renal disease on hemodialysis, diabetes mellitus type 2, hypertension, DJD, MSSA bacteremia in January 2021 presented on 06/07/2019 after a fall.  Apparently, she went to her dialysis center and as per schedule on 06/06/2019 but could not get her dialysis done due to issues with her AV fistula, subsequently went back home and then subsequently fell down, unclear if she had a syncopal event.  In the ED, patient was confused with creatinine of 13, potassium of 6.9 and bicarb of 14.  Nephrology was consulted.  She was also found to have left hip fracture for which orthopedics was consulted  Assessment & Plan:  Left hip fracture status post fall - she is s/p left hip fracture surgical repair on 06/10/2019, weightbearing as tolerated, Lovenox for DVT prophylaxis, commence PT OT, may require placement at SNF.   Hyperkalemia, End-stage renal disease on hemodialysis, Uremia with Aacute metabolic encephalopathy to missed dialysis and malfunctioning of her dialysis fistula. She has had temporary right IJ dialysis catheter placed this admission and dialysis has been started, now back on MWF schedule.  Nephrology following.  Mental status improving.  Vascular surgery planning to do left fistulogram on 06/13/2019 if this is not salvageable she will require a permanent tunneled dialysis catheter.  Leukocytosis - Probably reactive.  Improving  Mildly elevated high-sensitivity troponin - No chest pain. Likely secondary to ESRD.  No further work-up needed.  Hypertension - will place on Norvasc and monitor.  Anemia of chronic disease with mild perioperative blood loss related anemia.  She is Jehovah's Witness.  Monitor on oral iron currently no need for  transfusion.  Generalized deconditioning - We will need PT evaluation and may need rehab after left hip surgery. SNF ?[facility]  Constipation and nausea.  Abdominal exam and abdominal x-ray unremarkable, bowel regimen placed, Zofran as needed.  Mild delirium, fall night of 06/12/2019.  Patient and son were clearly advised about hospital-acquired delirium, metabolic encephalopathy, due to her fall she did enter mild right-sided pubic rami fracture, x-ray was discussed with orthopedic surgeon Dr. Rigoberto Noel, supportive care, lowest dose possible possible Seroquel night of 06/13/2019 as with 25 mg she tends to get overtly sedated.  Note on 06/12/2019 while using bedside commode patient had a fall, 2 hours later she got 25 mg of Seroquel with became extremely sleepy, on 06/13/2019 we will try 12.5 p.o. nightly Seroquel so that she can get better sleep.  Discussed with son plan of Seroquel both on 06/12/2019 AM bedside and over the phone on 06/13/2019.    DVT prophylaxis: Heparin>>Lovenox  Code Status: Full  Family Communication: Evette Cristal on 06/11/2019 at [phone] / [phone] - message left at 11:44 AM.  Discussed bedside on 06/12/2019.  Updated again over the phone on 06/13/2019, 06/14/19 - message left at 12:04 PM. . Disposition Plan: SNF after bed is available, recent left hip fracture surgical repair placated by hospital fall and right pubic ramus fracture, if pain-free and able to bear weight likely discharge on 06/15/2019 on 06/16/2019.  Consultants: Orthopedic/nephrology/vascular surgery  Procedures:    IR guided Right IJ temporary dialysis catheter placement on 06/08/2019  Left hip fracture surgical correction on 06/10/2019 by Dr. Erlinda Hong   Procedure - by VVS   1. leftbrachiobasilicarteriovenous fistulacannulation under ultrasound guidance  2. leftarm fistulogramincluding central venogram 3. left peripheral mid upper arm venoplasty (6 mm x 40 mm Mustang and 7 mm x 40 mm drug coated Ranger) 4. left  peripheral distal upper arm venoplasty at arterial anastomosis (4 mm x 40 mm Sterling and 5 mm x 60 mm drug coated Ranger) The left UE fistula may be accessed for HD F/U PRN   Antimicrobials:  None   Subjective:  Patient in bed, appears comfortable, denies any headache, no fever, no chest pain or pressure, no shortness of breath , no abdominal pain. No focal weakness.  She does have some low back right-sided pelvic pain when she bears weight.   Objective: Vitals:   06/13/19 1929 06/13/19 2040 06/14/19 0619 06/14/19 0855  BP: 137/71 123/79 128/71 117/75  Pulse: 83 81 71 67  Resp: 18 18  18   Temp: 97.7 F (36.5 C) 98.9 F (37.2 C) 97.7 F (36.5 C) 98.3 F (36.8 C)  TempSrc: Oral Oral Oral Oral  SpO2: 95% 95% 100% 97%  Weight:  81.6 kg    Height:        Intake/Output Summary (Last 24 hours) at 06/14/2019 1200 Last data filed at 06/14/2019 0900 Gross per 24 hour  Intake 360 ml  Output 1500 ml  Net -1140 ml   Filed Weights   06/10/19 1441 06/11/19 2040 06/13/19 2040  Weight: 77 kg 78.7 kg 81.6 kg    Examination:  Awake Alert, No new F.N deficits, Normal affect .AT,PERRAL Supple Neck,No JVD, No cervical lymphadenopathy appriciated.  Symmetrical Chest wall movement, Good air movement bilaterally, CTAB RRR,No Gallops, Rubs or new Murmurs, No Parasternal Heave +ve B.Sounds, Abd Soft, No tenderness, No organomegaly appriciated, No rebound - guarding or rigidity. Left hip postop site stable under bandage, right IJ temporary catheter.   Data Reviewed: I have personally reviewed following labs and imaging studies  CBC: Recent Labs  Lab 06/08/19 1915 06/08/19 1915 06/09/19 0336 06/11/19 0316 06/12/19 0416 06/13/19 0338 06/14/19 0358  WBC 12.0*   < > 10.8* 9.5 9.0 9.8 9.1  NEUTROABS 9.9*  --  8.6* 7.0  --   --   --   HGB 13.2   < > 11.9* 10.1* 10.1* 9.2* 9.6*  HCT 41.0   < > 36.7 31.9* 31.4* 28.7* 30.6*  MCV 97.9   < > 97.9 98.5 97.2 96.6 96.5  PLT 290   < >  283 195 186 189 217   < > = values in this interval not displayed.   Basic Metabolic Panel: Recent Labs  Lab 06/07/19 1725 06/08/19 0520 06/09/19 0336 06/11/19 0316 06/12/19 0416 06/13/19 0338 06/14/19 0358  NA  --    < > 137 133* 137 133* 134*  K  --    < > 4.1 4.4 3.4* 3.8 4.0  CL  --    < > 92* 91* 97* 92* 96*  CO2  --    < > 21* 23 27 25 25   GLUCOSE  --    < > 143* 132* 119* 114* 118*  BUN  --    < > 58* 58* 24* 47* 26*  CREATININE  --    < > 9.07* 7.63* 4.57* 6.48* 4.20*  CALCIUM  --    < > 8.6* 8.3* 8.0* 8.0* 7.9*  MG 2.2  --   --  2.0  --   --   --   PHOS  --   --   --  9.1*  --   --   --    < > =  values in this interval not displayed.   GFR: Estimated Creatinine Clearance: 13.1 mL/min (A) (by C-G formula based on SCr of 4.2 mg/dL (H)). Liver Function Tests: Recent Labs  Lab 06/08/19 1551  AST 55*  ALT <5  ALKPHOS 77  BILITOT 1.1  PROT 8.0  ALBUMIN 3.5   No results for input(s): LIPASE, AMYLASE in the last 168 hours. Recent Labs  Lab 06/08/19 1551  AMMONIA 37*   Coagulation Profile: Recent Labs  Lab 06/08/19 0603  INR 1.3*   Cardiac Enzymes: No results for input(s): CKTOTAL, CKMB, CKMBINDEX, TROPONINI in the last 168 hours. BNP (last 3 results) No results for input(s): PROBNP in the last 8760 hours. HbA1C: No results for input(s): HGBA1C in the last 72 hours. CBG: Recent Labs  Lab 06/10/19 0104 06/10/19 1724 06/13/19 0739 06/13/19 1118 06/14/19 0657  GLUCAP 272* 116* 109* 122* 162*   Lipid Profile: No results for input(s): CHOL, HDL, LDLCALC, TRIG, CHOLHDL, LDLDIRECT in the last 72 hours. Thyroid Function Tests: No results for input(s): TSH, T4TOTAL, FREET4, T3FREE, THYROIDAB in the last 72 hours. Anemia Panel: No results for input(s): VITAMINB12, FOLATE, FERRITIN, TIBC, IRON, RETICCTPCT in the last 72 hours. Sepsis Labs: Recent Labs  Lab 06/07/19 1309  LATICACIDVEN 1.2    Recent Results (from the past 240 hour(s))  Respiratory  Panel by RT PCR (Flu A&B, Covid) - Nasopharyngeal Swab     Status: None   Collection Time: 06/07/19  1:03 PM   Specimen: Nasopharyngeal Swab  Result Value Ref Range Status   SARS Coronavirus 2 by RT PCR NEGATIVE NEGATIVE Final    Comment: (NOTE) SARS-CoV-2 target nucleic acids are NOT DETECTED. The SARS-CoV-2 RNA is generally detectable in upper respiratoy specimens during the acute phase of infection. The lowest concentration of SARS-CoV-2 viral copies this assay can detect is 131 copies/mL. A negative result does not preclude SARS-Cov-2 infection and should not be used as the sole basis for treatment or other patient management decisions. A negative result may occur with  improper specimen collection/handling, submission of specimen other than nasopharyngeal swab, presence of viral mutation(s) within the areas targeted by this assay, and inadequate number of viral copies (<131 copies/mL). A negative result must be combined with clinical observations, patient history, and epidemiological information. The expected result is Negative. Fact Sheet for Patients:  PinkCheek.be Fact Sheet for Healthcare Providers:  GravelBags.it This test is not yet ap proved or cleared by the [place] FDA and  has been authorized for detection and/or diagnosis of SARS-CoV-2 by FDA under an Emergency Use Authorization (EUA). This EUA will remain  in effect (meaning this test can be used) for the duration of the COVID-19 declaration under Section 564(b)(1) of the Act, 21 U.S.C. section 360bbb-3(b)(1), unless the authorization is terminated or revoked sooner.    Influenza A by PCR NEGATIVE NEGATIVE Final   Influenza B by PCR NEGATIVE NEGATIVE Final    Comment: (NOTE) The Xpert Xpress SARS-CoV-2/FLU/RSV assay is intended as an aid in  the diagnosis of influenza from Nasopharyngeal swab specimens and  should not be used as a sole basis for  treatment. Nasal washings and  aspirates are unacceptable for Xpert Xpress SARS-CoV-2/FLU/RSV  testing. Fact Sheet for Patients: PinkCheek.be Fact Sheet for Healthcare Providers: GravelBags.it This test is not yet approved or cleared by the [place] FDA and  has been authorized for detection and/or diagnosis of SARS-CoV-2 by  FDA under an Emergency Use Authorization (EUA). This EUA will remain  in effect (  meaning this test can be used) for the duration of the  Covid-19 declaration under Section 564(b)(1) of the Act, 21  U.S.C. section 360bbb-3(b)(1), unless the authorization is  terminated or revoked. Performed at [facility], [place] [address], [place], [place] [address]   Culture, blood (routine x 2)     Status: None   Collection Time: 06/07/19  6:48 PM   Specimen: BLOOD  Result Value Ref Range Status   Specimen Description BLOOD RIGHT ANTECUBITAL  Final   Special Requests   Final    BOTTLES DRAWN AEROBIC AND ANAEROBIC Blood Culture adequate volume   Culture   Final    NO GROWTH 5 DAYS Performed at [facility], [place] [address], [place], [place] [address]    Report Status 06/12/2019 FINAL  Final  Culture, blood (routine x 2)     Status: None   Collection Time: 06/07/19 11:04 PM   Specimen: BLOOD  Result Value Ref Range Status   Specimen Description BLOOD RIGHT ANTECUBITAL  Final   Special Requests   Final    BOTTLES DRAWN AEROBIC AND ANAEROBIC Blood Culture adequate volume   Culture   Final    NO GROWTH 5 DAYS Performed at [facility], [place] [address] [place], [place] [address]    Report Status 06/13/2019 FINAL  Final  Surgical pcr screen     Status: None   Collection Time: 06/09/19  6:43 AM   Specimen: Nasal Mucosa; Nasal Swab  Result Value Ref Range Status   MRSA, PCR NEGATIVE NEGATIVE Final   Staphylococcus aureus NEGATIVE NEGATIVE Final    Comment: (NOTE) The Xpert SA Assay (FDA  approved for NASAL specimens in patients 59 years of age and older), is one component of a comprehensive surveillance program. It is not intended to diagnose infection nor to guide or monitor treatment. Performed at [facility], [place] [address], [place], [place] [address]      Radiology Studies:  DG Shoulder Right  Result Date: 06/12/2019 CLINICAL DATA:  Fall EXAM: RIGHT SHOULDER - 2+ VIEW COMPARISON:  04/05/2019 FINDINGS: Moderate AC joint degenerative change. Mild glenohumeral degenerative change. No fracture or dislocation. High-riding appearance of the humeral head with effacement of subacromial space. Right-sided central venous catheter tip over the right atrium. Fibrosis and bronchiectasis within the right lung apex IMPRESSION: 1. No acute osseous abnormality. 2. Degenerative changes of the [facility] joint and glenohumeral joint 3. High-riding humeral head consistent with rotator cuff disease 4. Fibrosis and bronchiectasis in the right apex Electronically Signed   By: Donavan Foil M.D.   On: 06/12/2019 23:41   CT HEAD WO CONTRAST  Result Date: 06/13/2019 CLINICAL DATA:  Initial evaluation for acute head trauma, fall, weakness. EXAM: CT HEAD WITHOUT CONTRAST TECHNIQUE: Contiguous axial images were obtained from the base of the skull through the vertex without intravenous contrast. COMPARISON:  Prior head CT from 06/07/2019. FINDINGS: Brain: Generalized age-related cerebral atrophy with chronic small vessel ischemic disease, stable. Scattered remote lacunar infarcts present within the bilateral basal ganglia and thalami. No acute intracranial hemorrhage. No acute large vessel territory infarct. No mass lesion, midline shift or mass effect. No hydrocephalus or extra-axial fluid collection. Vascular: No hyperdense vessel. Calcified atherosclerosis present at skull base. Skull: Scalp soft tissues within normal limits.  Calvarium intact. Sinuses/Orbits: Globes and orbital soft tissues within normal  limits. Paranasal sinuses are largely clear. No mastoid effusion. Other: None. IMPRESSION: 1. Stable head CT.  No acute intracranial  abnormality identified. 2. Age-related cerebral atrophy with chronic small vessel ischemic disease, with scattered remote lacunar infarcts involving the bilateral basal ganglia and thalami. Electronically Signed   By: Jeannine Boga M.D.   On: 06/13/2019 02:14   PERIPHERAL VASCULAR CATHETERIZATION  Result Date: 06/13/2019 OPERATIVE NOTE   PROCEDURE: 1. left brachiobasilic arteriovenous fistula cannulation under ultrasound guidance 2. left arm fistulogram including central venogram 3. left peripheral mid upper arm venoplasty (6 mm x 40 mm Mustang and 7 mm x 40 mm drug coated Ranger) 4. left peripheral distal upper arm venoplasty at arterial anastomosis (4 mm x 40 mm Sterling and 5 mm x 60 mm drug coated Ranger)   PRE-OPERATIVE DIAGNOSIS: Malfunctioning left arteriovenous fistula  POST-OPERATIVE DIAGNOSIS: same as above  SURGEON: Marty Heck, MD  ANESTHESIA: local  ESTIMATED BLOOD LOSS: 5 cc  FINDING(S): 1. Initially accessed the fistula antegrade and noted a 60% stenosis in the mid upper arm basilic vein fistula that was treated with a 6 mm Mustang and then 7 mm drug-coated Ranger.  On retrograde reflux shot there was a second near 95% stenosis in the distal upper arm near the arterial anastomosis.  Subsequently accessed the fistula retrograde through a second sheath and this was angioplastied across the arterial anastomosis with a 4 mm Sterling and then 5 mm drug-coated Ranger.  Excellent thrill upon completion with no residual stenosis.   SPECIMEN(S):  None  CONTRAST: 50 cc  INDICATIONS: Bianca Myers is a 74 y.o. female who presents with malfunctioning left brachiobasilic arteriovenous fistula.  The patient is scheduled for left arm fistulogram.  The patient is aware the risks include but are not limited to: bleeding, infection, thrombosis of the  cannulated access, and possible anaphylactic reaction to the contrast.  The patient is aware of the risks of the procedure and elects to proceed forward.  DESCRIPTION: After full informed written consent was obtained, the patient was brought back to the angiography suite and placed supine upon the angiography table.  The patient was connected to monitoring equipment.  The left arm was prepped and draped in the standard fashion for a left arm fistulogram.  Under ultrasound guidance, the left arteriovenous fistula was evaluated, it was patent, an image was saved.  It was cannulated with a micropuncture needle.  The microwire was advanced into the fistula and the needle was exchanged for the a microsheath, which was lodged 2 cm into the access.  The wire was removed and the sheath was connected to the IV extension tubing.  Hand injections were completed to image the access from the antecubitum up to the level of axilla.  The central venous structures were also imaged by hand injections.  Patient was given 3000 units of IV heparin.  We initially elected to intervene on a mid upper arm stenosis approximately 60% just distal to the existing stent.  We crossed this with a Bentson wire and placed a short 7 [place] sheath.  We then angioplastied this with a 6 mm x 40 mm Mustang and then 7 mm x 40 mm drug-coated Ranger.  During inflation we got a retrograde shot that showed a second lesion in the distal upper arm near the arterial anastomosis.  We then removed the sheath and a 4-0 pursestring was tied down.  I then used ultrasound and reaccessed the fistula retrograde toward the arterial anastomosis with a microwire and placed a micro sheath.  I then advanced a V 18 wire across the arterial anastomosis up the brachial artery  retrograde.  I then placed a short 5 [place] sheath.  I then angioplastied the distal upper arm basilic near the arterial anastomosis with a 4 mm Sterling and then a 5 mm drug-coated Ranger.  I then shot a  final retrograde shot that showed no residual stenosis.  Patient had an excellent thrill.  Wire was removed.  A 4-0 Monocryl pursestring was then tied down and sheath was removed.  COMPLICATIONS: None  CONDITION: Stable  Marty Heck, MD [facility] Office: [phone]  DG HIP UNILAT WITH PELVIS 1V RIGHT  Result Date: 06/12/2019 CLINICAL DATA:  Altered mental status, fall EXAM: DG HIP (WITH OR WITHOUT PELVIS) 1V RIGHT COMPARISON:  06/11/2019, 06/07/2019 FINDINGS: Status post left hip replacement with normal alignment. Gas in the hip soft tissues consistent with recent surgery. Surgical plate and fixating screws in the right acetabulum and inferior pubic ramus. New acute mildly comminuted fracture of the right superior pubic ramus. Calcified uterine fibroid IMPRESSION: 1. New acute mildly comminuted fracture involving the right superior pubic ramus. 2. Stable postsurgical changes of the right acetabulum. 3. Status post left hip replacement with postsurgical changes. Electronically Signed   By: Donavan Foil M.D.   On: 06/12/2019 23:38    Scheduled Meds:  . albuterol  2.5 mg Nebulization Once  . amLODipine  10 mg Oral Daily  . atorvastatin  10 mg Oral QHS  . Chlorhexidine Gluconate Cloth  6 each Topical Q0600  . Chlorhexidine Gluconate Cloth  6 each Topical Q0600  . docusate sodium  100 mg Oral BID  . enoxaparin (LOVENOX) injection  30 mg Subcutaneous Q24H  . escitalopram  10 mg Oral Daily  . ferric citrate  420 mg Oral TID WC  . heparin  2,000 Units Dialysis Once in dialysis  . labetalol  200 mg Oral BID  . polyethylene glycol  17 g Oral Daily  . QUEtiapine  12.5 mg Oral QHS   Continuous Infusions:   Total time spent: 30 minutes  Signature  Lala Lund M.D on 06/14/2019 at 12:00 PM   -  To page go to [email or web address]

## 2019-06-14 NOTE — TOC Progression Note (Signed)
Transition of Care [facility]) - Progression Note    Patient Details  Name: Bianca Myers MRN: [id number] Date of Birth: [birth year]  Transition of Care [facility]) CM/SW Contact  Bianca Salina Mila Homer, LCSW Phone Number: 06/14/2019, 12:50 PM  Clinical Narrative:  Talked with son, Bianca Myers [phone]) and informed him that [facility] declined. Son indicated that he is fine with [facility], after talking with Bianca Myers, care transitions nurse [phone]). Bianca Myers informed that insurance auth received and CSW will contact [facility] and provided name of facility.    Advised Bianca Myers of patient's HD Center and days and she requested chair times.  *PASRR number pending - requested clinicals provided to [place] [facility] on 4/12.    Expected Discharge Plan: [place] Barriers to Discharge: Continued Medical Work up, Ship broker, Environmental education officer)  Expected Discharge Plan and Services Expected Discharge Plan: [place] arrangements for the past 2 months: Single Family Home                                       Social Determinants of Health (SDOH) Interventions  No SDOH interventions requested or needed at this time.  Readmission Risk Interventions No flowsheet data found.

## 2019-06-14 NOTE — Progress Notes (Addendum)
[facility]  Subjective  - Doing well with a little soreness at incisions.   Objective 128/71 71 97.7 F (36.5 C) (Oral) 18 100%  Intake/Output Summary (Last 24 hours) at 06/14/2019 0730 Last data filed at 06/13/2019 2200 Gross per 24 hour  Intake 120 ml  Output 1500 ml  Net -1380 ml    Left UE with good palpable thrill in fistula Left radial palpable, grip 5/5, and intact sensation She has a temp right IJ cath  Assessment/Planning: POD # 1  PROCEDURE: 1. left brachiobasilic arteriovenous fistula cannulation under ultrasound guidance 2. left arm fistulogram including central venogram 3. left peripheral mid upper arm venoplasty (6 mm x 40 mm Mustang and 7 mm x 40 mm drug coated Ranger) 4. left peripheral distal upper arm venoplasty at arterial anastomosis (4 mm x 40 mm Sterling and 5 mm x 60 mm drug coated Ranger)   The left UE fistula may be accessed for HD F/U PRN  Roxy Horseman 06/14/2019 7:30 AM --  Laboratory Lab Results: Recent Labs    06/13/19 0338 06/14/19 0358  WBC 9.8 9.1  HGB 9.2* 9.6*  HCT 28.7* 30.6*  PLT 189 217   BMET Recent Labs    06/13/19 0338 06/14/19 0358  NA 133* 134*  K 3.8 4.0  CL 92* 96*  CO2 25 25  GLUCOSE 114* 118*  BUN 47* 26*  CREATININE 6.48* 4.20*  CALCIUM 8.0* 7.9*    COAG Lab Results  Component Value Date   INR 1.3 (H) 06/08/2019   INR 1.2 08/24/2018   INR 1.1 08/21/2018   No results found for: PTT   I have seen and evaluated the patient. I agree with the PA note as documented above. POD#1 s/p left arm fistulogram with intervention.  Great thrill.  Can use in dialysis.  F/U PRN.  Marty Heck, MD [facility] Office: [phone]

## 2019-06-14 NOTE — Progress Notes (Addendum)
[facility] Progress Note   Subjective: Closer to baseline today. Says she had a "bad day yesterday" but feeling better today. S/P f'gram yesterday. Used T Cath with HD yesterday.    Objective Vitals:   06/13/19 1929 06/13/19 2040 06/14/19 0619 06/14/19 0855  BP: 137/71 123/79 128/71 117/75  Pulse: 83 81 71 67  Resp: 18 18  18   Temp: 97.7 F (36.5 C) 98.9 F (37.2 C) 97.7 F (36.5 C) 98.3 F (36.8 C)  TempSrc: Oral Oral Oral Oral  SpO2: 95% 95% 100% 97%  Weight:  81.6 kg    Height:       Physical Exam General: WN, WD elderly female in NAD Heart: S1,S2 RRR Lungs: CTAB Abdomen: S, NT Active BS Extremities: No LE edema Dialysis Access: Temp cath RIJ. L AVF + bruit    Additional Objective Labs: Basic Metabolic Panel: Recent Labs  Lab 06/11/19 0316 06/11/19 0316 06/12/19 0416 06/13/19 0338 06/14/19 0358  NA 133*   < > 137 133* 134*  K 4.4   < > 3.4* 3.8 4.0  CL 91*   < > 97* 92* 96*  CO2 23   < > 27 25 25   GLUCOSE 132*   < > 119* 114* 118*  BUN 58*   < > 24* 47* 26*  CREATININE 7.63*   < > 4.57* 6.48* 4.20*  CALCIUM 8.3*   < > 8.0* 8.0* 7.9*  PHOS 9.1*  --   --   --   --    < > = values in this interval not displayed.   Liver Function Tests: Recent Labs  Lab 06/07/19 1116 06/08/19 1551  AST 29 55*  ALT <5 <5  ALKPHOS 84 77  BILITOT 0.9 1.1  PROT 7.9 8.0  ALBUMIN 3.4* 3.5   Recent Labs  Lab 06/07/19 1116  LIPASE 29   CBC: Recent Labs  Lab 06/08/19 1915 06/08/19 1915 06/09/19 0336 06/09/19 0336 06/11/19 0316 06/11/19 0316 06/12/19 0416 06/13/19 0338 06/14/19 0358  WBC 12.0*   < > 10.8*   < > 9.5   < > 9.0 9.8 9.1  NEUTROABS 9.9*  --  8.6*  --  7.0  --   --   --   --   HGB 13.2   < > 11.9*   < > 10.1*   < > 10.1* 9.2* 9.6*  HCT 41.0   < > 36.7   < > 31.9*   < > 31.4* 28.7* 30.6*  MCV 97.9   < > 97.9  --  98.5  --  97.2 96.6 96.5  PLT 290   < > 283   < > 195   < > 186 189 217   < > = values in this interval not displayed.    Blood Culture    Component Value Date/Time   SDES BLOOD RIGHT ANTECUBITAL 06/07/2019 2304   SPECREQUEST  06/07/2019 2304    BOTTLES DRAWN AEROBIC AND ANAEROBIC Blood Culture adequate volume   CULT  06/07/2019 2304    NO GROWTH 5 DAYS Performed at [facility], [place] [address], [place], [place] [address]    REPTSTATUS 06/13/2019 FINAL 06/07/2019 2304    Cardiac Enzymes: No results for input(s): CKTOTAL, CKMB, CKMBINDEX, TROPONINI in the last 168 hours. CBG: Recent Labs  Lab 06/10/19 0104 06/10/19 1724 06/13/19 0739 06/13/19 1118 06/14/19 0657  GLUCAP 272* 116* 109* 122* 162*   Iron Studies: No results for input(s): IRON, TIBC, TRANSFERRIN, FERRITIN in  the last 72 hours. @lablastinr3 @ Studies/Results: DG Shoulder Right  Result Date: 06/12/2019 CLINICAL DATA:  Fall EXAM: RIGHT SHOULDER - 2+ VIEW COMPARISON:  04/05/2019 FINDINGS: Moderate AC joint degenerative change. Mild glenohumeral degenerative change. No fracture or dislocation. High-riding appearance of the humeral head with effacement of subacromial space. Right-sided central venous catheter tip over the right atrium. Fibrosis and bronchiectasis within the right lung apex IMPRESSION: 1. No acute osseous abnormality. 2. Degenerative changes of the [facility] joint and glenohumeral joint 3. High-riding humeral head consistent with rotator cuff disease 4. Fibrosis and bronchiectasis in the right apex Electronically Signed   By: Donavan Foil M.D.   On: 06/12/2019 23:41   CT HEAD WO CONTRAST  Result Date: 06/13/2019 CLINICAL DATA:  Initial evaluation for acute head trauma, fall, weakness. EXAM: CT HEAD WITHOUT CONTRAST TECHNIQUE: Contiguous axial images were obtained from the base of the skull through the vertex without intravenous contrast. COMPARISON:  Prior head CT from 06/07/2019. FINDINGS: Brain: Generalized age-related cerebral atrophy with chronic small vessel ischemic disease, stable. Scattered remote lacunar infarcts  present within the bilateral basal ganglia and thalami. No acute intracranial hemorrhage. No acute large vessel territory infarct. No mass lesion, midline shift or mass effect. No hydrocephalus or extra-axial fluid collection. Vascular: No hyperdense vessel. Calcified atherosclerosis present at skull base. Skull: Scalp soft tissues within normal limits.  Calvarium intact. Sinuses/Orbits: Globes and orbital soft tissues within normal limits. Paranasal sinuses are largely clear. No mastoid effusion. Other: None. IMPRESSION: 1. Stable head CT.  No acute intracranial abnormality identified. 2. Age-related cerebral atrophy with chronic small vessel ischemic disease, with scattered remote lacunar infarcts involving the bilateral basal ganglia and thalami. Electronically Signed   By: Jeannine Boga M.D.   On: 06/13/2019 02:14   PERIPHERAL VASCULAR CATHETERIZATION  Result Date: 06/13/2019 OPERATIVE NOTE   PROCEDURE: 1. left brachiobasilic arteriovenous fistula cannulation under ultrasound guidance 2. left arm fistulogram including central venogram 3. left peripheral mid upper arm venoplasty (6 mm x 40 mm Mustang and 7 mm x 40 mm drug coated Ranger) 4. left peripheral distal upper arm venoplasty at arterial anastomosis (4 mm x 40 mm Sterling and 5 mm x 60 mm drug coated Ranger)   PRE-OPERATIVE DIAGNOSIS: Malfunctioning left arteriovenous fistula  POST-OPERATIVE DIAGNOSIS: same as above  SURGEON: Marty Heck, MD  ANESTHESIA: local  ESTIMATED BLOOD LOSS: 5 cc  FINDING(S): 1. Initially accessed the fistula antegrade and noted a 60% stenosis in the mid upper arm basilic vein fistula that was treated with a 6 mm Mustang and then 7 mm drug-coated Ranger.  On retrograde reflux shot there was a second near 95% stenosis in the distal upper arm near the arterial anastomosis.  Subsequently accessed the fistula retrograde through a second sheath and this was angioplastied across the arterial anastomosis with a  4 mm Sterling and then 5 mm drug-coated Ranger.  Excellent thrill upon completion with no residual stenosis.   SPECIMEN(S):  None  CONTRAST: 50 cc  INDICATIONS: Rhiann Boucher is a 74 y.o. female who presents with malfunctioning left brachiobasilic arteriovenous fistula.  The patient is scheduled for left arm fistulogram.  The patient is aware the risks include but are not limited to: bleeding, infection, thrombosis of the cannulated access, and possible anaphylactic reaction to the contrast.  The patient is aware of the risks of the procedure and elects to proceed forward.  DESCRIPTION: After full informed written consent was obtained, the patient was brought back to the angiography suite and  placed supine upon the angiography table.  The patient was connected to monitoring equipment.  The left arm was prepped and draped in the standard fashion for a left arm fistulogram.  Under ultrasound guidance, the left arteriovenous fistula was evaluated, it was patent, an image was saved.  It was cannulated with a micropuncture needle.  The microwire was advanced into the fistula and the needle was exchanged for the a microsheath, which was lodged 2 cm into the access.  The wire was removed and the sheath was connected to the IV extension tubing.  Hand injections were completed to image the access from the antecubitum up to the level of axilla.  The central venous structures were also imaged by hand injections.  Patient was given 3000 units of IV heparin.  We initially elected to intervene on a mid upper arm stenosis approximately 60% just distal to the existing stent.  We crossed this with a Bentson wire and placed a short 7 [place] sheath.  We then angioplastied this with a 6 mm x 40 mm Mustang and then 7 mm x 40 mm drug-coated Ranger.  During inflation we got a retrograde shot that showed a second lesion in the distal upper arm near the arterial anastomosis.  We then removed the sheath and a 4-0 pursestring was tied  down.  I then used ultrasound and reaccessed the fistula retrograde toward the arterial anastomosis with a microwire and placed a micro sheath.  I then advanced a V 18 wire across the arterial anastomosis up the brachial artery retrograde.  I then placed a short 5 [place] sheath.  I then angioplastied the distal upper arm basilic near the arterial anastomosis with a 4 mm Sterling and then a 5 mm drug-coated Ranger.  I then shot a final retrograde shot that showed no residual stenosis.  Patient had an excellent thrill.  Wire was removed.  A 4-0 Monocryl pursestring was then tied down and sheath was removed.  COMPLICATIONS: None  CONDITION: Stable  Marty Heck, MD [facility] Office: [phone]  DG HIP UNILAT WITH PELVIS 1V RIGHT  Result Date: 06/12/2019 CLINICAL DATA:  Altered mental status, fall EXAM: DG HIP (WITH OR WITHOUT PELVIS) 1V RIGHT COMPARISON:  06/11/2019, 06/07/2019 FINDINGS: Status post left hip replacement with normal alignment. Gas in the hip soft tissues consistent with recent surgery. Surgical plate and fixating screws in the right acetabulum and inferior pubic ramus. New acute mildly comminuted fracture of the right superior pubic ramus. Calcified uterine fibroid IMPRESSION: 1. New acute mildly comminuted fracture involving the right superior pubic ramus. 2. Stable postsurgical changes of the right acetabulum. 3. Status post left hip replacement with postsurgical changes. Electronically Signed   By: Donavan Foil M.D.   On: 06/12/2019 23:38   Medications:  . albuterol  2.5 mg Nebulization Once  . amLODipine  10 mg Oral Daily  . atorvastatin  10 mg Oral QHS  . Chlorhexidine Gluconate Cloth  6 each Topical Q0600  . Chlorhexidine Gluconate Cloth  6 each Topical Q0600  . docusate sodium  100 mg Oral BID  . enoxaparin (LOVENOX) injection  30 mg Subcutaneous Q24H  . escitalopram  10 mg Oral Daily  . ferric citrate  420 mg Oral TID WC  . heparin   2,000 Units Dialysis Once in dialysis  . labetalol  200 mg Oral BID  . polyethylene glycol  17 g Oral Daily  . QUEtiapine  12.5 mg Oral QHS  Dialysis: [facility] MWF 4hr 180NRe 350/800  77kg 2K/2.25Ca UFP 4 AVF  -Heparin 2000 units IV TIW -Mircera 200 mcg IV q 2 weeks (last 3/31)  Assessment/Plan: 1. Confusion/AMS on 4/7. Concern for seizure/uremia.AdmitBUN 120/Cr 14.5.  EEG nonspecific. Close to baseline mentally today. Seems to be improving.  2. Left femoral neck fracture s/p syncopal event at home. S/P prosthetic replacement for femoral neck fx 4/09/21per Dr. Erlinda Hong.  3. Hyperkalemia -Resolved with HD. 4. ESRD -HD MWF. Missed HD 06/06/19  d/t difficult cannulation of AVF.Unable to cannulate fistulaTuesday. Appreciate IR placement of temp cath4/7.Had serial HD 4/7 and 4/8.D/T elevated SCr/BUN. Next HD 06/15/2019. Use AVF prior to DC.  5. AVF malfunction - ongoing difficulty with cannulation. VVS consulted, F'gram 04/12 per Dr. Carlis Abbott. Mid upper arm venoplasty at arterial anastomosis, X 2. Use with HD tomorrow. Be sure T-cath removed prior to DC.  6. Hypertension/volume - BP and volume controlled, UF as tolerated.  Continue amlodipine 5 mg PO daily, labetalol 200 mg PO bid. 7. Anemia - Hgb10.1No ESA needs.PATIENT IS [employer]IT[employer], DOES NOT ACCEPT BLOOD PRODUCTS.  8. Metabolic bone disease -Continue OP binders, no VDRA.  9. Nutrition - NPO at present. Renal diet/fluid restriction when eating.  Rita H. Brown NP-C 06/14/2019, 10:59 AM  [employer] [phone]  I have seen and examined this patient and agree with plan and assessment in the above note with renal recommendations/intervention highlighted.  Pt complaining of dysphagia and odynophagia.  She feels that the liquid famotidine helped.  Will order daily but may need to be evaluated for thrush or have EGD/barium swallow.  Governor Rooks Brennah Quraishi,MD 06/14/2019 3:43 PM

## 2019-06-15 ENCOUNTER — Inpatient Hospital Stay (HOSPITAL_COMMUNITY): Payer: Medicare HMO | Admitting: Certified Registered"

## 2019-06-15 ENCOUNTER — Encounter (HOSPITAL_COMMUNITY)
Admission: EM | Disposition: A | Discharge status: Skilled Nursing Facility | Payer: Self-pay | Source: Home / Self Care | Attending: Internal Medicine

## 2019-06-15 ENCOUNTER — Encounter (HOSPITAL_COMMUNITY): Payer: Self-pay | Admitting: Internal Medicine

## 2019-06-15 ENCOUNTER — Inpatient Hospital Stay (HOSPITAL_COMMUNITY): Payer: Medicare HMO

## 2019-06-15 DIAGNOSIS — S72002A Fracture of unspecified part of neck of left femur, initial encounter for closed fracture: Secondary | ICD-10-CM | POA: Diagnosis not present

## 2019-06-15 HISTORY — PX: INSERTION OF DIALYSIS CATHETER: SHX1324

## 2019-06-15 LAB — CBC
HCT: 30.2 % — ABNORMAL LOW (ref 36.0–46.0)
Hemoglobin: 9.5 g/dL — ABNORMAL LOW (ref 12.0–15.0)
MCH: 30.6 pg (ref 26.0–34.0)
MCHC: 31.5 g/dL (ref 30.0–36.0)
MCV: 97.4 fL (ref 80.0–100.0)
Platelets: 236 10*3/uL (ref 150–400)
RBC: 3.1 MIL/uL — ABNORMAL LOW (ref 3.87–5.11)
RDW: 18.1 % — ABNORMAL HIGH (ref 11.5–15.5)
WBC: 9.8 10*3/uL (ref 4.0–10.5)
nRBC: 0 % (ref 0.0–0.2)

## 2019-06-15 LAB — POCT I-STAT, CHEM 8
BUN: 15 mg/dL (ref 8–23)
Calcium, Ion: 1.17 mmol/L (ref 1.15–1.40)
Chloride: 93 mmol/L — ABNORMAL LOW (ref 98–111)
Creatinine, Ser: 2.7 mg/dL — ABNORMAL HIGH (ref 0.44–1.00)
Glucose, Bld: 136 mg/dL — ABNORMAL HIGH (ref 70–99)
HCT: 41 % (ref 36.0–46.0)
Hemoglobin: 13.9 g/dL (ref 12.0–15.0)
Potassium: 5 mmol/L (ref 3.5–5.1)
Sodium: 135 mmol/L (ref 135–145)
TCO2: 34 mmol/L — ABNORMAL HIGH (ref 22–32)

## 2019-06-15 LAB — BASIC METABOLIC PANEL
Anion gap: 16 — ABNORMAL HIGH (ref 5–15)
BUN: 44 mg/dL — ABNORMAL HIGH (ref 8–23)
CO2: 23 mmol/L (ref 22–32)
Calcium: 8.5 mg/dL — ABNORMAL LOW (ref 8.9–10.3)
Chloride: 93 mmol/L — ABNORMAL LOW (ref 98–111)
Creatinine, Ser: 5.87 mg/dL — ABNORMAL HIGH (ref 0.44–1.00)
GFR calc Af Amer: 8 mL/min — ABNORMAL LOW (ref >60)
GFR calc non Af Amer: 7 mL/min — ABNORMAL LOW (ref >60)
Glucose, Bld: 131 mg/dL — ABNORMAL HIGH (ref 70–99)
Potassium: 4.2 mmol/L (ref 3.5–5.1)
Sodium: 132 mmol/L — ABNORMAL LOW (ref 135–145)

## 2019-06-15 LAB — GLUCOSE, CAPILLARY
Glucose-Capillary: 165 mg/dL — ABNORMAL HIGH (ref 70–99)
Glucose-Capillary: 105 mg/dL — ABNORMAL HIGH (ref 70–99)

## 2019-06-15 SURGERY — INSERTION OF DIALYSIS CATHETER
Anesthesia: Monitor Anesthesia Care

## 2019-06-15 MED ORDER — HEPARIN SODIUM (PORCINE) 1000 UNIT/ML IJ SOLN
INTRAMUSCULAR | Status: DC | PRN
  Administered 2019-06-15: 3400 [IU] via INTRAVENOUS

## 2019-06-15 MED ORDER — CEFAZOLIN SODIUM-DEXTROSE 2-3 GM-%(50ML) IV SOLR
INTRAVENOUS | Status: DC | PRN
  Administered 2019-06-15: 2 g via INTRAVENOUS

## 2019-06-15 MED ORDER — LIDOCAINE HCL (CARDIAC) PF 100 MG/5ML IV SOSY: PREFILLED_SYRINGE | INTRAVENOUS | Status: DC | PRN

## 2019-06-15 MED ORDER — LIDOCAINE HCL (CARDIAC) PF 100 MG/5ML IV SOSY
PREFILLED_SYRINGE | INTRAVENOUS | Status: DC | PRN
  Administered 2019-06-15: 40 mg via INTRAVENOUS

## 2019-06-15 MED ORDER — SODIUM CHLORIDE 0.9 % IV SOLN: INTRAVENOUS | Status: DC

## 2019-06-15 MED ORDER — FENTANYL CITRATE (PF) 250 MCG/5ML IJ SOLN
INTRAMUSCULAR | Status: AC
  Filled 2019-06-15: qty 5

## 2019-06-15 MED ORDER — SODIUM CHLORIDE 0.9 % IV SOLN: INTRAVENOUS | Status: DC | PRN

## 2019-06-15 MED ORDER — 0.9 % SODIUM CHLORIDE (POUR BTL) OPTIME
TOPICAL | Status: DC | PRN
  Administered 2019-06-15: 1000 mL

## 2019-06-15 MED ORDER — FENTANYL CITRATE (PF) 100 MCG/2ML IJ SOLN
INTRAMUSCULAR | Status: DC | PRN
  Administered 2019-06-15: 50 ug via INTRAVENOUS

## 2019-06-15 MED ORDER — HEPARIN SODIUM (PORCINE) 1000 UNIT/ML IJ SOLN
INTRAMUSCULAR | Status: AC
  Filled 2019-06-15: qty 3

## 2019-06-15 MED ORDER — PROPOFOL 500 MG/50ML IV EMUL
INTRAVENOUS | Status: DC | PRN
  Administered 2019-06-15: 50 ug/kg/min via INTRAVENOUS

## 2019-06-15 MED ORDER — PROPOFOL 10 MG/ML IV BOLUS
INTRAVENOUS | Status: AC
  Filled 2019-06-15: qty 20

## 2019-06-15 MED ORDER — LIDOCAINE-EPINEPHRINE (PF) 1 %-1:200000 IJ SOLN
INTRAMUSCULAR | Status: DC | PRN
  Administered 2019-06-15: 14 mL

## 2019-06-15 SURGICAL SUPPLY — 43 items
BAG DECANTER FOR FLEXI CONT (MISCELLANEOUS) ×3 IMPLANT
BIOPATCH RED 1 DISK 7.0 (GAUZE/BANDAGES/DRESSINGS) ×2 IMPLANT
BIOPATCH RED 1IN DISK 7.0MM (GAUZE/BANDAGES/DRESSINGS) ×1
CATH PALINDROME RT-P 15FX19CM (CATHETERS) IMPLANT
CATH PALINDROME RT-P 15FX23CM (CATHETERS) ×2 IMPLANT
CATH PALINDROME RT-P 15FX28CM (CATHETERS) IMPLANT
CATH PALINDROME RT-P 15FX55CM (CATHETERS) IMPLANT
COVER DOME SNAP 22 D (MISCELLANEOUS) ×2 IMPLANT
COVER PROBE W GEL 5X96 (DRAPES) ×3 IMPLANT
COVER SURGICAL LIGHT HANDLE (MISCELLANEOUS) ×3 IMPLANT
COVER WAND RF STERILE (DRAPES) ×1 IMPLANT
DERMABOND ADVANCED (GAUZE/BANDAGES/DRESSINGS) ×2
DERMABOND ADVANCED .7 DNX12 (GAUZE/BANDAGES/DRESSINGS) ×1 IMPLANT
DRAPE C-ARM 42X72 X-RAY (DRAPES) ×1 IMPLANT
DRAPE CHEST BREAST 15X10 FENES (DRAPES) ×3 IMPLANT
GAUZE 4X4 16PLY RFD (DISPOSABLE) ×3 IMPLANT
GLOVE BIOGEL PI IND STRL 7.5 (GLOVE) ×1 IMPLANT
GLOVE BIOGEL PI INDICATOR 7.5 (GLOVE) ×2
GLOVE SURG SS PI 7.5 STRL IVOR (GLOVE) ×3 IMPLANT
GOWN STRL REUS W/ TWL LRG LVL3 (GOWN DISPOSABLE) ×1 IMPLANT
GOWN STRL REUS W/ TWL XL LVL3 (GOWN DISPOSABLE) ×1 IMPLANT
GOWN STRL REUS W/TWL LRG LVL3 (GOWN DISPOSABLE) ×2
GOWN STRL REUS W/TWL XL LVL3 (GOWN DISPOSABLE) ×2
KIT BASIN OR (CUSTOM PROCEDURE TRAY) ×3 IMPLANT
KIT TURNOVER KIT B (KITS) ×3 IMPLANT
NDL 18GX1X1/2 (RX/OR ONLY) (NEEDLE) ×1 IMPLANT
NDL HYPO 25GX1X1/2 BEV (NEEDLE) ×1 IMPLANT
NEEDLE 18GX1X1/2 (RX/OR ONLY) (NEEDLE) ×3 IMPLANT
NEEDLE HYPO 25GX1X1/2 BEV (NEEDLE) ×3 IMPLANT
NS IRRIG 1000ML POUR BTL (IV SOLUTION) ×3 IMPLANT
PACK SURGICAL SETUP 50X90 (CUSTOM PROCEDURE TRAY) ×3 IMPLANT
PAD ARMBOARD 7.5X6 YLW CONV (MISCELLANEOUS) ×6 IMPLANT
SOAP 2 % CHG 4 OZ (WOUND CARE) ×3 IMPLANT
SUT ETHILON 3 0 PS 1 (SUTURE) ×3 IMPLANT
SUT VICRYL 4-0 PS2 18IN ABS (SUTURE) ×3 IMPLANT
SYR 10ML LL (SYRINGE) ×3 IMPLANT
SYR 20ML LL LF (SYRINGE) ×6 IMPLANT
SYR 5ML LL (SYRINGE) ×3 IMPLANT
SYR CONTROL 10ML LL (SYRINGE) ×3 IMPLANT
TOWEL GREEN STERILE (TOWEL DISPOSABLE) ×6 IMPLANT
TOWEL GREEN STERILE FF (TOWEL DISPOSABLE) ×3 IMPLANT
WATER STERILE IRR 1000ML POUR (IV SOLUTION) ×3 IMPLANT
WIRE BENTSON .035X145CM (WIRE) ×2 IMPLANT

## 2019-06-15 NOTE — Op Note (Signed)
    Patient name: Bianca Myers MRN: [id number] DOB: 09/22/1945 Sex: female   06/15/2019 Pre-operative Diagnosis: ESRD Post-operative diagnosis:  Same Procedure: Placement of right internal jugular vein tunneled dialysis catheter under fluoroscopic visualization and removal of temporary right internal jugular vein dialysis catheter Anesthesia: MAC Blood Loss: Minimal Specimens: None  Findings: Catheter tip at cavoatrial junction  Indications: The patient is in need of a tunnel catheter.  There is an existing temporary catheter in place.  Procedure:  The patient was identified in the holding area and taken to [place] [facility]  The patient was then placed supine on the table. MAC anesthesia was administered.  The patient was prepped and draped in the usual sterile fashion.  A time out was called and antibiotics were administered.  1% lidocaine was used for local anesthesia.  A 035 Bentson wire was inserted through the existing catheter which was then removed.  This was done under fluoroscopic visualization.  A peel-away sheath was then placed.  A 23 cm palindrome catheter was then advanced through the sheath which was removed.  A skin exit site was selected and a #11 leg was used to make a skin nick.  A subcutaneous tunnel was created and the catheter was brought through the tunnel with the cuff situated the skin exit site.  Fluoroscopy was used to confirm that the catheter tip was at the cavoatrial junction.  The catheter was connected to the hub.  Both ports flushed and aspirated without difficulty.  The catheter was sutured into position with 3-0 nylon.  The neck incision was closed with 4-0 Vicryl.  There were no immediate complications.   Disposition: To PACU stable.   Theotis Burrow, M.D., [facility] [place] Office: [phone] Pager:  [phone]

## 2019-06-15 NOTE — Progress Notes (Addendum)
Patient ID: Kariss Longmire, female   DOB: 1945/06/24, 74 y.o.   MRN: [id number]  PROGRESS NOTE    Zaraya Delauder  JSH:[id number] DOB: 1945-10-12 DOA: 06/07/2019 PCP: [name], MD   Brief Narrative:  74 year old female with history of end-stage renal disease on hemodialysis, diabetes mellitus type 2, hypertension, DJD, MSSA bacteremia in January 2021 presented on 06/07/2019 after a fall.  Apparently, she went to her dialysis center and as per schedule on 06/06/2019 but could not get her dialysis done due to issues with her AV fistula, subsequently went back home and then subsequently fell down, unclear if she had a syncopal event.  In the ED, patient was confused with creatinine of 13, potassium of 6.9 and bicarb of 14.  Nephrology was consulted.  She was also found to have left hip fracture for which orthopedics was consulted  Assessment & Plan:  Left hip fracture status post fall - she is s/p left hip fracture surgical repair on 06/10/2019, weightbearing as tolerated, Lovenox for DVT prophylaxis, commence PT OT, may require placement at SNF.   ESRD on hemodialysis, Uremia with Aacute metabolic encephalopathy to missed dialysis and malfunctioning of her dialysis fistula. She has had temporary right IJ dialysis catheter placed this admission and dialysis has been started, now back on MWF schedule.  Nephrology following.  Mental status improving.  She underwent left upper extremity fistula recannulation on 06/13/2019 unfortunately dialysis team unable to access it on 06/15/2019, defer management of access to nephrology and vascular surgery, for now temporary catheter being used if everything fails she might require a tunneled HD catheter placement this admission.  Leukocytosis - Probably reactive.  Improving  Mildly elevated high-sensitivity troponin - No chest pain. Likely secondary to ESRD.  No further work-up needed.  Hypertension - will place on Norvasc and monitor.  Anemia of chronic  disease with mild perioperative blood loss related anemia.  She is Jehovah's Witness.  Monitor on oral iron currently no need for transfusion.  Generalized deconditioning - We will need PT evaluation and may need rehab after left hip surgery. SNF ?[facility]  Constipation and nausea.  Abdominal exam and abdominal x-ray unremarkable, bowel regimen placed, Zofran as needed.  Much improved.  Mild delirium, fall night of 06/12/2019.  Patient and son were clearly advised about hospital-acquired delirium, metabolic encephalopathy, due to her fall she did enter mild right-sided pubic rami fracture, x-ray was discussed with orthopedic surgeon Dr. Rigoberto Noel, supportive care, lowest dose possible possible Seroquel night of 06/13/2019 as with 25 mg she tends to get overtly sedated.  Note on 06/12/2019 while using bedside commode patient had a fall, 2 hours later she got 25 mg of Seroquel with became extremely sleepy, on 06/13/2019 we will try 12.5 p.o. nightly Seroquel so that she can get better sleep.  Discussed with son plan of Seroquel both on 06/12/2019 AM bedside and over the phone on 06/13/2019.    DVT prophylaxis: Heparin>>Lovenox  Code Status: Full  Family Communication: Evette Cristal on 06/11/2019 at [phone] / [phone] - message left at 11:44 AM.  Discussed bedside on 06/12/2019.  Updated again over the phone on 06/13/2019, 06/14/19 - message left at 12:04 PM. . Disposition Plan: SNF after bed is available, recent left hip fracture surgical repair placated by hospital fall and right pubic ramus fracture, if pain-free and able to bear weight likely discharge on 06/16/2019 if cleared by nephrology and vascular surgery, still has access issues.  Consultants: Orthopedic/nephrology/vascular surgery  Procedures:    IR guided  Right IJ temporary dialysis catheter placement on 06/08/2019  Left hip fracture surgical correction on 06/10/2019 by Dr. Erlinda Hong   Procedure - by VVS   1. leftbrachiobasilicarteriovenous  fistulacannulation under ultrasound guidance 2. leftarm fistulogramincluding central venogram 3. left peripheral mid upper arm venoplasty (6 mm x 40 mm Mustang and 7 mm x 40 mm drug coated Ranger) 4. left peripheral distal upper arm venoplasty at arterial anastomosis (4 mm x 40 mm Sterling and 5 mm x 60 mm drug coated Ranger) The left UE fistula may be accessed for HD F/U PRN   Antimicrobials:  None   Subjective:  Patient in bed undergoing dialysis, denies any headache, no chest abdominal pain, no shortness of breath.  Left-sided hip pain is stable, she does have some right-sided low back pain upon standing up and working with PT.   Objective: Vitals:   06/15/19 0830 06/15/19 0900 06/15/19 1000 06/15/19 1030  BP: 126/70 (!) 125/50 133/63 (!) 124/55  Pulse: 73 62 68 68  Resp:      Temp:      TempSrc:      SpO2:      Weight:      Height:        Intake/Output Summary (Last 24 hours) at 06/15/2019 1121 Last data filed at 06/15/2019 0700 Gross per 24 hour  Intake 660 ml  Output 0 ml  Net 660 ml   Filed Weights   06/11/19 2040 06/13/19 2040 06/15/19 0740  Weight: 78.7 kg 81.6 kg 93 kg    Examination:  Awake minimally confused, No new F.N deficits,   .AT,PERRAL Supple Neck,No JVD, No cervical lymphadenopathy appriciated.  Symmetrical Chest wall movement, Good air movement bilaterally, CTAB RRR,No Gallops, Rubs or new Murmurs, No Parasternal Heave +ve B.Sounds, Abd Soft, No tenderness, No organomegaly appriciated, No rebound - guarding or rigidity. Left hip postop site stable under bandage, right IJ temporary catheter.   Data Reviewed: I have personally reviewed following labs and imaging studies  CBC: Recent Labs  Lab 06/08/19 1915 06/08/19 1915 06/09/19 0336 06/09/19 0336 06/11/19 0316 06/12/19 0416 06/13/19 0338 06/14/19 0358 06/15/19 0404  WBC 12.0*   < > 10.8*   < > 9.5 9.0 9.8 9.1 9.8  NEUTROABS 9.9*  --  8.6*  --  7.0  --   --   --   --   HGB  13.2   < > 11.9*   < > 10.1* 10.1* 9.2* 9.6* 9.5*  HCT 41.0   < > 36.7   < > 31.9* 31.4* 28.7* 30.6* 30.2*  MCV 97.9   < > 97.9   < > 98.5 97.2 96.6 96.5 97.4  PLT 290   < > 283   < > 195 186 189 217 236   < > = values in this interval not displayed.   Basic Metabolic Panel: Recent Labs  Lab 06/11/19 0316 06/12/19 0416 06/13/19 0338 06/14/19 0358 06/15/19 0404  NA 133* 137 133* 134* 132*  K 4.4 3.4* 3.8 4.0 4.2  CL 91* 97* 92* 96* 93*  CO2 23 27 25 25 23   GLUCOSE 132* 119* 114* 118* 131*  BUN 58* 24* 47* 26* 44*  CREATININE 7.63* 4.57* 6.48* 4.20* 5.87*  CALCIUM 8.3* 8.0* 8.0* 7.9* 8.5*  MG 2.0  --   --   --   --   PHOS 9.1*  --   --   --   --    GFR: Estimated Creatinine Clearance: 10 mL/min (A) (by C-G  formula based on SCr of 5.87 mg/dL (H)). Liver Function Tests: Recent Labs  Lab 06/08/19 1551  AST 55*  ALT <5  ALKPHOS 77  BILITOT 1.1  PROT 8.0  ALBUMIN 3.5   No results for input(s): LIPASE, AMYLASE in the last 168 hours. Recent Labs  Lab 06/08/19 1551  AMMONIA 37*   Coagulation Profile: No results for input(s): INR, PROTIME in the last 168 hours. Cardiac Enzymes: No results for input(s): CKTOTAL, CKMB, CKMBINDEX, TROPONINI in the last 168 hours. BNP (last 3 results) No results for input(s): PROBNP in the last 8760 hours. HbA1C: No results for input(s): HGBA1C in the last 72 hours. CBG: Recent Labs  Lab 06/10/19 1724 06/13/19 0739 06/13/19 1118 06/14/19 0657 06/15/19 0644  GLUCAP 116* 109* 122* 162* 165*   Lipid Profile: No results for input(s): CHOL, HDL, LDLCALC, TRIG, CHOLHDL, LDLDIRECT in the last 72 hours. Thyroid Function Tests: No results for input(s): TSH, T4TOTAL, FREET4, T3FREE, THYROIDAB in the last 72 hours. Anemia Panel: No results for input(s): VITAMINB12, FOLATE, FERRITIN, TIBC, IRON, RETICCTPCT in the last 72 hours. Sepsis Labs: No results for input(s): PROCALCITON, LATICACIDVEN in the last 168 hours.  Recent Results (from the  past 240 hour(s))  Respiratory Panel by RT PCR (Flu A&B, Covid) - Nasopharyngeal Swab     Status: None   Collection Time: 06/07/19  1:03 PM   Specimen: Nasopharyngeal Swab  Result Value Ref Range Status   SARS Coronavirus 2 by RT PCR NEGATIVE NEGATIVE Final    Comment: (NOTE) SARS-CoV-2 target nucleic acids are NOT DETECTED. The SARS-CoV-2 RNA is generally detectable in upper respiratoy specimens during the acute phase of infection. The lowest concentration of SARS-CoV-2 viral copies this assay can detect is 131 copies/mL. A negative result does not preclude SARS-Cov-2 infection and should not be used as the sole basis for treatment or other patient management decisions. A negative result may occur with  improper specimen collection/handling, submission of specimen other than nasopharyngeal swab, presence of viral mutation(s) within the areas targeted by this assay, and inadequate number of viral copies (<131 copies/mL). A negative result must be combined with clinical observations, patient history, and epidemiological information. The expected result is Negative. Fact Sheet for Patients:  PinkCheek.be Fact Sheet for Healthcare Providers:  GravelBags.it This test is not yet ap proved or cleared by the [place] FDA and  has been authorized for detection and/or diagnosis of SARS-CoV-2 by FDA under an Emergency Use Authorization (EUA). This EUA will remain  in effect (meaning this test can be used) for the duration of the COVID-19 declaration under Section 564(b)(1) of the Act, 21 U.S.C. section 360bbb-3(b)(1), unless the authorization is terminated or revoked sooner.    Influenza A by PCR NEGATIVE NEGATIVE Final   Influenza B by PCR NEGATIVE NEGATIVE Final    Comment: (NOTE) The Xpert Xpress SARS-CoV-2/FLU/RSV assay is intended as an aid in  the diagnosis of influenza from Nasopharyngeal swab specimens and  should not be  used as a sole basis for treatment. Nasal washings and  aspirates are unacceptable for Xpert Xpress SARS-CoV-2/FLU/RSV  testing. Fact Sheet for Patients: PinkCheek.be Fact Sheet for Healthcare Providers: GravelBags.it This test is not yet approved or cleared by the [place] FDA and  has been authorized for detection and/or diagnosis of SARS-CoV-2 by  FDA under an Emergency Use Authorization (EUA). This EUA will remain  in effect (meaning this test can be used) for the duration of the  Covid-19 declaration under Section 564(b)(1)  of the Act, 21  U.S.C. section 360bbb-3(b)(1), unless the authorization is  terminated or revoked. Performed at [facility], [place] [address] [place], [place] [address]   Culture, blood (routine x 2)     Status: None   Collection Time: 06/07/19  6:48 PM   Specimen: BLOOD  Result Value Ref Range Status   Specimen Description BLOOD RIGHT ANTECUBITAL  Final   Special Requests   Final    BOTTLES DRAWN AEROBIC AND ANAEROBIC Blood Culture adequate volume   Culture   Final    NO GROWTH 5 DAYS Performed at [facility], [place] [address], [place], [place] [address]    Report Status 06/12/2019 FINAL  Final  Culture, blood (routine x 2)     Status: None   Collection Time: 06/07/19 11:04 PM   Specimen: BLOOD  Result Value Ref Range Status   Specimen Description BLOOD RIGHT ANTECUBITAL  Final   Special Requests   Final    BOTTLES DRAWN AEROBIC AND ANAEROBIC Blood Culture adequate volume   Culture   Final    NO GROWTH 5 DAYS Performed at [facility], [place] [address], [place], [place] [address]    Report Status 06/13/2019 FINAL  Final  Surgical pcr screen     Status: None   Collection Time: 06/09/19  6:43 AM   Specimen: Nasal Mucosa; Nasal Swab  Result Value Ref Range Status   MRSA, PCR NEGATIVE NEGATIVE Final   Staphylococcus aureus NEGATIVE NEGATIVE Final    Comment: (NOTE) The  Xpert SA Assay (FDA approved for NASAL specimens in patients 98 years of age and older), is one component of a comprehensive surveillance program. It is not intended to diagnose infection nor to guide or monitor treatment. Performed at [facility], [place] [address] [place], [place] [address]   SARS CORONAVIRUS 2 (TAT 6-24 HRS) Nasopharyngeal Nasopharyngeal Swab     Status: None   Collection Time: 06/14/19  4:04 PM   Specimen: Nasopharyngeal Swab  Result Value Ref Range Status   SARS Coronavirus 2 NEGATIVE NEGATIVE Final    Comment: (NOTE) SARS-CoV-2 target nucleic acids are NOT DETECTED. The SARS-CoV-2 RNA is generally detectable in upper and lower respiratory specimens during the acute phase of infection. Negative results do not preclude SARS-CoV-2 infection, do not rule out co-infections with other pathogens, and should not be used as the sole basis for treatment or other patient management decisions. Negative results must be combined with clinical observations, patient history, and epidemiological information. The expected result is Negative. Fact Sheet for Patients: SugarRoll.be Fact Sheet for Healthcare Providers: https://www.woods-mathews.com/ This test is not yet approved or cleared by the [place] FDA and  has been authorized for detection and/or diagnosis of SARS-CoV-2 by FDA under an Emergency Use Authorization (EUA). This EUA will remain  in effect (meaning this test can be used) for the duration of the COVID-19 declaration under Section 56 4(b)(1) of the Act, 21 U.S.C. section 360bbb-3(b)(1), unless the authorization is terminated or revoked sooner. Performed at [facility], [place] [address], [place], [place] [address]      Radiology Studies:  PERIPHERAL VASCULAR CATHETERIZATION  Result Date: 06/13/2019 OPERATIVE NOTE   PROCEDURE: 1. left brachiobasilic arteriovenous fistula cannulation under ultrasound  guidance 2. left arm fistulogram including central venogram 3. left peripheral mid upper arm venoplasty (6 mm x 40 mm Mustang and 7 mm x 40 mm drug coated Ranger) 4. left peripheral distal upper arm  venoplasty at arterial anastomosis (4 mm x 40 mm Sterling and 5 mm x 60 mm drug coated Ranger)   PRE-OPERATIVE DIAGNOSIS: Malfunctioning left arteriovenous fistula  POST-OPERATIVE DIAGNOSIS: same as above  SURGEON: Marty Heck, MD  ANESTHESIA: local  ESTIMATED BLOOD LOSS: 5 cc  FINDING(S): 1. Initially accessed the fistula antegrade and noted a 60% stenosis in the mid upper arm basilic vein fistula that was treated with a 6 mm Mustang and then 7 mm drug-coated Ranger.  On retrograde reflux shot there was a second near 95% stenosis in the distal upper arm near the arterial anastomosis.  Subsequently accessed the fistula retrograde through a second sheath and this was angioplastied across the arterial anastomosis with a 4 mm Sterling and then 5 mm drug-coated Ranger.  Excellent thrill upon completion with no residual stenosis.   SPECIMEN(S):  None  CONTRAST: 50 cc  INDICATIONS: Lydiann Bonifas is a 74 y.o. female who presents with malfunctioning left brachiobasilic arteriovenous fistula.  The patient is scheduled for left arm fistulogram.  The patient is aware the risks include but are not limited to: bleeding, infection, thrombosis of the cannulated access, and possible anaphylactic reaction to the contrast.  The patient is aware of the risks of the procedure and elects to proceed forward.  DESCRIPTION: After full informed written consent was obtained, the patient was brought back to the angiography suite and placed supine upon the angiography table.  The patient was connected to monitoring equipment.  The left arm was prepped and draped in the standard fashion for a left arm fistulogram.  Under ultrasound guidance, the left arteriovenous fistula was evaluated, it was patent, an image was saved.  It  was cannulated with a micropuncture needle.  The microwire was advanced into the fistula and the needle was exchanged for the a microsheath, which was lodged 2 cm into the access.  The wire was removed and the sheath was connected to the IV extension tubing.  Hand injections were completed to image the access from the antecubitum up to the level of axilla.  The central venous structures were also imaged by hand injections.  Patient was given 3000 units of IV heparin.  We initially elected to intervene on a mid upper arm stenosis approximately 60% just distal to the existing stent.  We crossed this with a Bentson wire and placed a short 7 [place] sheath.  We then angioplastied this with a 6 mm x 40 mm Mustang and then 7 mm x 40 mm drug-coated Ranger.  During inflation we got a retrograde shot that showed a second lesion in the distal upper arm near the arterial anastomosis.  We then removed the sheath and a 4-0 pursestring was tied down.  I then used ultrasound and reaccessed the fistula retrograde toward the arterial anastomosis with a microwire and placed a micro sheath.  I then advanced a V 18 wire across the arterial anastomosis up the brachial artery retrograde.  I then placed a short 5 [place] sheath.  I then angioplastied the distal upper arm basilic near the arterial anastomosis with a 4 mm Sterling and then a 5 mm drug-coated Ranger.  I then shot a final retrograde shot that showed no residual stenosis.  Patient had an excellent thrill.  Wire was removed.  A 4-0 Monocryl pursestring was then tied down and sheath was removed.  COMPLICATIONS: None  CONDITION: Stable  Marty Heck, MD [facility] Office: [phone]   Scheduled Meds:  .  albuterol  2.5 mg Nebulization Once  . amLODipine  10 mg Oral Daily  . atorvastatin  10 mg Oral QHS  . Chlorhexidine Gluconate Cloth  6 each Topical Q0600  . Chlorhexidine Gluconate Cloth  6 each Topical Q0600  . docusate  sodium  100 mg Oral BID  . enoxaparin (LOVENOX) injection  30 mg Subcutaneous Q24H  . escitalopram  10 mg Oral Daily  . famotidine  40 mg Oral Daily  . ferric citrate  420 mg Oral TID WC  . heparin      . heparin  2,000 Units Dialysis Once in dialysis  . labetalol  200 mg Oral BID  . polyethylene glycol  17 g Oral Daily  . QUEtiapine  12.5 mg Oral QHS   Continuous Infusions:   Total time spent: 30 minutes  Signature  Lala Lund M.D on 06/15/2019 at 11:21 AM   -  To page go to [email or web address]

## 2019-06-15 NOTE — TOC Progression Note (Signed)
Transition of Care [facility]) - Progression Note    Patient Details  Name: Bianca Myers MRN: [id number] Date of Birth: September 21, 1945  [facility] CM/SW Contact  Sharlet Salina Mila Homer, LCSW Phone Number: 06/15/2019, 3:44 PM  Clinical Narrative: Call received from [facility], with Rhame MUST (10:54) regarding PASRR evaluation for patient. Patient was in dialysis at the time of her call, and talked again with [facility] this afternoon and PASRR evaluation will be conducted Thursday after 10 am. Call made to [place] with [facility] and provided update regarding patient's probable discharge on 4/15. Advised [place] that [employer] was contacted and approval was changed to 4/14 - 4/16, and could be updated again if needed.        Expected Discharge Plan: [place] Barriers to Discharge: Continued Medical Work up, Ship broker, Environmental education officer)  Expected Discharge Plan and Services Expected Discharge Plan: [place] arrangements for the past 2 months: Single Family Home                                     No SDOH interventions requested or needed at this time.  Social Determinants of Health (SDOH) Interventions    Readmission Risk Interventions No flowsheet data found.

## 2019-06-15 NOTE — Progress Notes (Addendum)
Late Entry   This RN called to patient's room on night of 4/11.  Patient laying lengthwise on the floor and on right side with head against the wall.  From the chest down, her body was laying on the floor mat.  Patient advised to remain still.  Head, neck, and shoulders palpated.  No bumps or bleeding noted.  Arms and lower extremities assessed with no obvious deformities.  Patient able to rotate head, shoulder, arms, and legs with no c/o pain or obvious distress noted in patient.  She was able to sit on the floor with no distress.  Able to be helped to stand with assistance of two additional nurses with minimal difficulty.  Her left leg is weaker than right secondary to previous hip fracture.  VS stable.  Neuro exam stable.  [place] NP made aware by primary nurse.  Primary nurse notified patient's son, Brenton Grills, of the fall.  He expressed concern about possible injury to right shoulder since his mother has had several procedures to this shoulder.  We did obtain an xray to right shoulder which showed no acute abnormality.  Once primary nurse made son aware of new fracture, he again asked to speak to Charge Nurse.  Son expressed great concern about his mother being left on the bedside commode by herself.  He understands that her mentation waxes and wanes.  Service recovery was actively persued.  This Charge Nurse talked with son multiple times to keep him updated on his mother's care during the night.  He requested that his mother's care be reassigned to me since he felt more comfortable with me caring for his mother for the remainder of the night.  I also advised that I would have the day provider contact him with anticipated plan of care.  Patient received first time dose of Seroquel during the night and became drowsy and difficult to arouse.  Since it was an unwitnessed fall, we did obtain order for head CT which showed no acute abnormalities.  Also, consulted with Rapid Response Nurse at beside to ensure that  Neuro exam showed nothing acute.  It did not except for the drowsiness.  Patient had received first dose of Seroquel 25 mg approximately 2 hours after the fall which is felt to have contributed to the drowsiness.  Patient remained stable during the night.  Earleen Reaper RN

## 2019-06-15 NOTE — Procedures (Signed)
I was present at this dialysis session. I have reviewed the session itself and made appropriate changes.   Vital signs in last 24 hours:  Temp:  [97.3 F (36.3 C)-98.5 F (36.9 C)] 97.9 F (36.6 C) (04/14 0740) Pulse Rate:  [62-75] 62 (04/14 0800) Resp:  [16-18] 16 (04/14 0740) BP: (95-167)/(39-93) 95/39 (04/14 0800) SpO2:  [97 %-100 %] 98 % (04/14 0740) Weight:  [93 kg] 93 kg (04/14 0740) Weight change:  Filed Weights   06/11/19 2040 06/13/19 2040 06/15/19 0740  Weight: 78.7 kg 81.6 kg 93 kg    Recent Labs  Lab 06/11/19 0316 06/12/19 0416 06/15/19 0404  NA 133*   < > 132*  K 4.4   < > 4.2  CL 91*   < > 93*  CO2 23   < > 23  GLUCOSE 132*   < > 131*  BUN 58*   < > 44*  CREATININE 7.63*   < > 5.87*  CALCIUM 8.3*   < > 8.5*  PHOS 9.1*  --   --    < > = values in this interval not displayed.    Recent Labs  Lab 06/08/19 1915 06/08/19 1915 06/09/19 0336 06/09/19 0336 06/11/19 0316 06/12/19 0416 06/13/19 0338 06/14/19 0358 06/15/19 0404  WBC 12.0*   < > 10.8*   < > 9.5   < > 9.8 9.1 9.8  NEUTROABS 9.9*  --  8.6*  --  7.0  --   --   --   --   HGB 13.2   < > 11.9*   < > 10.1*   < > 9.2* 9.6* 9.5*  HCT 41.0   < > 36.7   < > 31.9*   < > 28.7* 30.6* 30.2*  MCV 97.9   < > 97.9   < > 98.5   < > 96.6 96.5 97.4  PLT 290   < > 283   < > 195   < > 189 217 236   < > = values in this interval not displayed.    Scheduled Meds: . albuterol  2.5 mg Nebulization Once  . amLODipine  10 mg Oral Daily  . atorvastatin  10 mg Oral QHS  . Chlorhexidine Gluconate Cloth  6 each Topical Q0600  . Chlorhexidine Gluconate Cloth  6 each Topical Q0600  . docusate sodium  100 mg Oral BID  . enoxaparin (LOVENOX) injection  30 mg Subcutaneous Q24H  . escitalopram  10 mg Oral Daily  . famotidine  40 mg Oral Daily  . ferric citrate  420 mg Oral TID WC  . heparin  2,000 Units Dialysis Once in dialysis  . labetalol  200 mg Oral BID  . polyethylene glycol  17 g Oral Daily  . QUEtiapine  12.5  mg Oral QHS   Continuous Infusions: PRN Meds:.acetaminophen, HYDROcodone-acetaminophen, ondansetron (ZOFRAN) IV, sodium chloride flush, sorbitol      Dialysis: [facility] MWF 4hr 180NRe350/800 77kg 2K/2.25Ca UFP 4 AVF  -Heparin 2000units IV TIW -Mircera 221mcg IV q 2 weeks(last 3/31)  Assessment/Plan: 1. Confusion/AMS on 4/7. Concern for seizure/uremia.AdmitBUN 120/Cr 14.5.EEG nonspecific. Close to baseline mentally today. Seems to be improving.  2. Left femoral neck fracture s/p syncopal event at home.S/P prosthetic replacement for femoral neck fx4/09/21per Dr. Erlinda Hong. 3. Hyperkalemia -Resolved with HD. 4. ESRD -HD MWF. Missed HD04/05/21d/t difficult cannulation of AVF.Unable to cannulate fistulaTuesday. Appreciate IR placement of temp cath4/7.Had serialHD 4/7and 4/8.D/T elevated SCr/BUN. Next HD 06/15/2019. Use AVF prior to DC.  5. AVF malfunction - ongoing difficulty with cannulation. VVS consulted, F'gram 04/12 per Dr. Carlis Abbott. Mid upper arm venoplasty at arterial anastomosis, X 2. Unable to successfully cannulate AVF today with HD and now using temporary cath.  VVS contacted.  Will need TDC before discharge and will likely need ongoing vascular interventions.   6. Hypertension/volume -BP and volume controlled, UF as tolerated. Continueamlodipine 5 mg PO daily, labetalol 200mg  PObid. 7. Anemia - Hgb10.1No ESA needs.PATIENT IS JEHOVAH WITNESS, DOES NOT ACCEPT BLOOD PRODUCTS. 8. Metabolic bone disease -Continue OP binders, no VDRA. 9. Nutrition -NPO at present.Renal diet/fluid restriction when eating. 10. Disposition- will need to have a tunneled dialysis catheter placed before discharge.   Donetta Potts,  MD 06/15/2019, 8:17 AM

## 2019-06-15 NOTE — Progress Notes (Signed)
attempt x3 to access left upper arm AVF without success by writer and charge nurse Camelia Eng., RN. AVF with +bruit and thrill accessed with 15g needled with minimal blood return, needle play within access unable to aspirate. Vascular team and Dr. Marval Regal made aware. Pt. Stable HD initiated with HD catheter.

## 2019-06-15 NOTE — Anesthesia Preprocedure Evaluation (Signed)
Anesthesia Evaluation  Patient identified by MRN, date of birth, ID band Patient awake    Reviewed: Allergy & Precautions, NPO status , Patient's Chart, lab work & pertinent test results, reviewed documented beta blocker date and time   History of Anesthesia Complications Negative for: history of anesthetic complications  Airway Mallampati: II  TM Distance: >3 FB Neck ROM: Full    Dental  (+) Dental Advisory Given, Teeth Intact   Pulmonary pneumonia, former smoker,  Quit smoking 1973   Pulmonary exam normal breath sounds clear to auscultation       Cardiovascular hypertension, Pt. on medications and Pt. on home beta blockers +CHF  Normal cardiovascular exam+ Valvular Problems/Murmurs (moderate MR) MR  Rhythm:Regular Rate:Normal  Echo 03/24/2019 1. Left ventricular ejection fraction, by visual estimation, is 60 to 65%. The left ventricle has normal function. There is no left ventricular hypertrophy.  2. Left ventricular diastolic parameters are consistent with Grade II diastolic dysfunction (pseudonormalization).  3. The left ventricle has no regional wall motion abnormalities.  4. Global right ventricle has normal systolic function.The right ventricular size is normal. No increase in right ventricular wall thickness.  5. Left atrial size was mild-moderately dilated.  6. Right atrial size was normal.  7. The mitral valve is degenerative. Trivial mitral valve regurgitation.  8. The tricuspid valve is normal in structure.  9. The aortic valve was not well visualized. Aortic valve regurgitation is trivial. Mild aortic valve stenosis.  10. The pulmonic valve was grossly normal. Pulmonic valve regurgitation is not visualized.  11. The aortic root was not well visualized.  12. Mildly elevated pulmonary artery systolic pressure.  13. The inferior vena cava is normal in size with greater than 50% respiratory variability, suggesting  right atrial pressure of 3 mmHg.  14. No clear evidence of endocarditis on this study. If clinically appropriate, consider TEE.    Neuro/Psych PSYCHIATRIC DISORDERS Depression negative neurological ROS     GI/Hepatic negative GI ROS, Neg liver ROS,   Endo/Other  diabetes, Well Controlled, Type 2hperparathyroid  Renal/GU ESRF and DialysisRenal diseaseHD catheter in place     Musculoskeletal  (+) Arthritis , Osteoarthritis,  L hip fx Gout DDD spondylolisthesis   Abdominal Normal abdominal exam  (+)   Peds  Hematology  (+) Blood dyscrasia, anemia , REFUSES BLOOD PRODUCTS, JEHOVAH'S WITNESShct 36.7, plt 283   Anesthesia Other Findings   Reproductive/Obstetrics                            Anesthesia Physical  Anesthesia Plan  ASA: III  Anesthesia Plan: General   Post-op Pain Management:    Induction: Intravenous  PONV Risk Score and Plan: 4 or greater and Ondansetron, Dexamethasone and Treatment may vary due to age or medical condition  Airway Management Planned: LMA  Additional Equipment:   Intra-op Plan:   Post-operative Plan: Extubation in OR  Informed Consent: I have reviewed the patients History and Physical, chart, labs and discussed the procedure including the risks, benefits and alternatives for the proposed anesthesia with the patient or authorized representative who has indicated his/her understanding and acceptance.     Dental advisory given  Plan Discussed with: CRNA  Anesthesia Plan Comments:        Anesthesia Quick Evaluation

## 2019-06-15 NOTE — Transfer of Care (Signed)
Immediate Anesthesia Transfer of Care Note  Patient: Bianca Myers  Procedure(s) Performed: INSERTION OF TUNNELED  DIALYSIS CATHETER RIGHT INTERNAL JUGULAR (N/A )  Patient Location: PACU  Anesthesia Type:MAC  Level of Consciousness: responds to stimulation  Airway & Oxygen Therapy: Patient Spontanous Breathing and Patient connected to nasal cannula oxygen  Post-op Assessment: Report given to RN and Post -op Vital signs reviewed and stable  Post vital signs: Reviewed and stable  Last Vitals:  Vitals Value Taken Time  BP 127/80 06/15/19 1344  Temp    Pulse    Resp 13 06/15/19 1345  SpO2    Vitals shown include unvalidated device data.  Last Pain:  Vitals:   06/15/19 0740  TempSrc: Oral  PainSc: Asleep      Patients Stated Pain Goal: 2 (53/74/82 7078)  Complications: No apparent anesthesia complications

## 2019-06-15 NOTE — H&P (Signed)
Patient name: Madinah Quarry MRN: [id number] DOB: 1945-07-13 Sex: female    HISTORY OF PRESENT ILLNESS:   Devanie Galanti is a 74 y.o. female with ESRD who is having trouble with her fistula.  She is here for conversioon of a temp cath to permcath  CURRENT MEDICATIONS:    Current Facility-Administered Medications  Medication Dose Route Frequency Provider Last Rate Last Admin  . 0.9 %  sodium chloride infusion   Intravenous Continuous Nolon Nations, MD 10 mL/hr at 06/15/19 1224 New Bag at 06/15/19 1224  . [MAR Hold] acetaminophen (TYLENOL) tablet 650 mg  650 mg Oral Q8H PRN Leandrew Koyanagi, MD   650 mg at 06/13/19 2123  . [MAR Hold] albuterol (PROVENTIL) (2.5 MG/3ML) 0.083% nebulizer solution 2.5 mg  2.5 mg Nebulization Once Leandrew Koyanagi, MD      . Doug Sou Hold] amLODipine (NORVASC) tablet 10 mg  10 mg Oral Daily Thurnell Lose, MD   10 mg at 06/14/19 0912  . [MAR Hold] atorvastatin (LIPITOR) tablet 10 mg  10 mg Oral QHS Leandrew Koyanagi, MD   10 mg at 06/14/19 2107  . [MAR Hold] Chlorhexidine Gluconate Cloth 2 % PADS 6 each  6 each Topical Q0600 Leandrew Koyanagi, MD   6 each at 06/12/19 [phone]  . [MAR Hold] Chlorhexidine Gluconate Cloth 2 % PADS 6 each  6 each Topical Q0600 Leandrew Koyanagi, MD   6 each at 06/15/19 0602  . [MAR Hold] docusate sodium (COLACE) capsule 100 mg  100 mg Oral BID Leandrew Koyanagi, MD   100 mg at 06/14/19 2106  . [MAR Hold] enoxaparin (LOVENOX) injection 30 mg  30 mg Subcutaneous Q24H Rolla Flatten, [place]   30 mg at 06/14/19 2107  . [MAR Hold] escitalopram (LEXAPRO) tablet 10 mg  10 mg Oral Daily Leandrew Koyanagi, MD   10 mg at 06/14/19 0912  . famotidine (PEPCID) 40 MG/5ML suspension 40 mg  40 mg Oral Daily Donato Heinz, MD   40 mg at 06/14/19 1713  . [MAR Hold] ferric citrate (AURYXIA) tablet 420 mg  420 mg Oral TID WC Leandrew Koyanagi, MD   420 mg at 06/14/19 1714  . heparin 1000 UNIT/ML injection           . [MAR Hold] heparin  injection 2,000 Units  2,000 Units Dialysis Once in dialysis Leandrew Koyanagi, MD      . Doug Sou Hold] HYDROcodone-acetaminophen (NORCO/VICODIN) 5-325 MG per tablet 1 tablet  1 tablet Oral Q8H PRN Thurnell Lose, MD   1 tablet at 06/14/19 2106  . [MAR Hold] labetalol (NORMODYNE) tablet 200 mg  200 mg Oral BID Leandrew Koyanagi, MD   200 mg at 06/14/19 2106  . [MAR Hold] ondansetron (ZOFRAN) injection 4 mg  4 mg Intravenous Q6H PRN Thurnell Lose, MD   4 mg at 06/12/19 0918  . [MAR Hold] polyethylene glycol (MIRALAX / GLYCOLAX) packet 17 g  17 g Oral Daily Thurnell Lose, MD   17 g at 06/14/19 0911  . [MAR Hold] QUEtiapine (SEROQUEL) tablet 12.5 mg  12.5 mg Oral QHS Thurnell Lose, MD   12.5 mg at 06/14/19 2107  . [MAR Hold] sodium chloride flush (NS) 0.9 % injection 10-40 mL  10-40 mL Intracatheter PRN Leandrew Koyanagi, MD      . Doug Sou Hold] sorbitol 70 % solution 30 mL  30 mL Oral Daily PRN Leandrew Koyanagi, MD  Facility-Administered Medications Ordered in Other Encounters  Medication Dose Route Frequency Provider Last Rate Last Admin  . lidocaine (cardiac) 100 mg/25mL (XYLOCAINE) injection 2%   Tracheal Tube Anesthesia Intra-op Gaylene Brooks, CRNA   100 mg at 06/15/19 1240    REVIEW OF SYSTEMS:   [X]  denotes positive finding, [ ]  denotes negative finding Cardiac  Comments:  Chest pain or chest pressure:    Shortness of breath upon exertion:    Short of breath when lying flat:    Irregular heart rhythm:    Constitutional    Fever or chills:      PHYSICAL EXAM:   Vitals:   06/15/19 1030 06/15/19 1100 06/15/19 1130 06/15/19 1222  BP: (!) 124/55 128/65 (!) (P) 124/56   Pulse: 68 60 (!) (P) 59   Resp:      Temp:      TempSrc:      SpO2:      Weight:    93 kg  Height:    5\' 7"  (1.702 m)    GENERAL: The patient is a well-nourished female, in no acute distress. The vital signs are documented above. CARDIOVASCULAR: There is a regular rate and rhythm. PULMONARY: Non-labored  respirations Alert and oriented x3   STUDIES:   none   MEDICAL ISSUES:   Discussed converting her temp cath to permcath. SHe is in agreement to proceed.  I tried to contact her son to update him, but left a message. The patient signed her own consent which all involved were comfortable with  Leia Alf, MD, FACS [facility] [phone] Pager [phone]

## 2019-06-16 DIAGNOSIS — S72002A Fracture of unspecified part of neck of left femur, initial encounter for closed fracture: Secondary | ICD-10-CM | POA: Diagnosis not present

## 2019-06-16 MED ORDER — AMLODIPINE BESYLATE 10 MG PO TABS: 10.0000 mg | ORAL_TABLET | Freq: Every day | ORAL | Status: DC

## 2019-06-16 MED ORDER — QUETIAPINE FUMARATE 25 MG PO TABS: 12.5000 mg | ORAL_TABLET | Freq: Every day | ORAL | Status: DC

## 2019-06-16 NOTE — Progress Notes (Signed)
DISCHARGE NOTE HOME Bianca Myers to be discharged Skilled nursing facility per MD order. Discussed prescriptions and follow up appointments with the patient. Prescriptions given to patient; medication list explained in detail. Patient verbalized understanding.  Skin clean, dry and intact without evidence of skin break down, no evidence of skin tears noted. IV catheter discontinued intact. Site without signs and symptoms of complications. Dressing and pressure applied. Pt denies pain at the site currently. No complaints noted.  Patient free of lines, drains, and wounds.   An After Visit Summary (AVS) was printed and given to the patient. Patient escorted via wheelchair, and discharged home via private auto.  Arlyss Repress, RN

## 2019-06-16 NOTE — Plan of Care (Signed)
  Problem: Coping: Goal: Level of anxiety will decrease Outcome: Progressing   Problem: Activity: Goal: Risk for activity intolerance will decrease Outcome: Progressing   

## 2019-06-16 NOTE — Progress Notes (Signed)
Physical Therapy Treatment Patient Details Name: Bianca Myers MRN: [id number] DOB: 1945-05-27 Today's Date: 06/16/2019    History of Present Illness Bianca Myers is a 74 y.o. female with medical history significant for ESRD on hemodialysis, type 2 diabetes mellitus, hypertension, DJD, history of MSSA bacteremia who presemts to the emergency room after a fall, resulting in a L hip fracture and found to have acute metabolic encephelopathy. S/p anterior hip hemiarthroplasty 06/10/19. Per notes, patient now also has Right pubic rami fracture. WBAT on B LEs.    PT Comments    Pt was seen for mobility on bed, with assistance to go through ROM on legs.  Pt was tired from being up earlier and declined OOB this afternoon.  Has achieved tolerance for assisted ROM and will continue to encourage her to work up to tolerance with standing and sitting side of bed.  Will also encourage more time OOB and standing when ready.   Follow Up Recommendations  SNF     Equipment Recommendations  None recommended by PT    Recommendations for Other Services       Precautions / Restrictions Precautions Precautions: Fall Precaution Comments: pt has anxiety about pain Restrictions Weight Bearing Restrictions: Yes LLE Weight Bearing: Weight bearing as tolerated    Mobility  Bed Mobility Overal bed mobility: Needs Assistance Bed Mobility: Rolling(scooting) Rolling: Mod assist         General bed mobility comments: scooting with max assist  Transfers                 General transfer comment: declined  Ambulation/Gait                 Stairs             Wheelchair Mobility    Modified Rankin (Stroke Patients Only)       Balance                                            Cognition Arousal/Alertness: Awake/alert Behavior During Therapy: WFL for tasks assessed/performed Overall Cognitive Status: Impaired/Different from baseline Area of  Impairment: Following commands;Safety/judgement;Awareness                 Orientation Level: Situation Current Attention Level: Selective   Following Commands: Follows one step commands inconsistently;Follows one step commands with increased time Safety/Judgement: Decreased awareness of deficits Awareness: Intellectual   General Comments: pt does not have good awareness of her injuries       Exercises General Exercises - Lower Extremity Ankle Circles/Pumps: AAROM;5 reps Quad Sets: AROM;10 reps Gluteal Sets: AROM;10 reps Heel Slides: AAROM;10 reps Hip ABduction/ADduction: AAROM;10 reps Straight Leg Raises: AAROM;10 reps Hip Flexion/Marching: AAROM;10 reps    General Comments        Pertinent Vitals/Pain Pain Assessment: 0-10 Pain Score: 8  Pain Location: Right groin, L LE Pain Descriptors / Indicators: Operative site guarding;Grimacing;Guarding Pain Intervention(s): Limited activity within patient's tolerance;Premedicated before session;Monitored during session;Repositioned    Home Living                      Prior Function            PT Goals (current goals can now be found in the care plan section) Acute Rehab PT Goals Patient Stated Goal: go home and feel better Progress towards PT goals: Progressing toward goals  Frequency    Min 3X/week      PT Plan Current plan remains appropriate    Co-evaluation              AM-PAC PT "6 Clicks" Mobility   Outcome Measure  Help needed turning from your back to your side while in a flat bed without using bedrails?: A Lot Help needed moving from lying on your back to sitting on the side of a flat bed without using bedrails?: A Lot Help needed moving to and from a bed to a chair (including a wheelchair)?: Total Help needed standing up from a chair using your arms (e.g., wheelchair or bedside chair)?: Total Help needed to walk in hospital room?: Total Help needed climbing 3-5 steps with a  railing? : Total 6 Click Score: 8    End of Session   Activity Tolerance: Patient limited by pain Patient left: in bed;with call bell/phone within reach;with bed alarm set Nurse Communication: Mobility status PT Visit Diagnosis: Other abnormalities of gait and mobility (R26.89);Muscle weakness (generalized) (M62.81);Pain;History of falling (Z91.81) Pain - Right/Left: (both) Pain - part of body: Leg;Hip     Time: 6283-6629 PT Time Calculation (min) (ACUTE ONLY): 28 min  Charges:  $Therapeutic Exercise: 23-37 mins             Ramond Dial 06/16/2019, 10:27 PM  Mee Hives, PT MS Acute Rehab Dept. Number: [place] [facility] [place]

## 2019-06-16 NOTE — Discharge Summary (Signed)
Bianca Myers JGG:[id number] DOB: 05-Nov-1945 DOA: 06/07/2019  PCP: [name], MD  Admit date: 06/07/2019  Discharge date: 06/16/2019  Admitted From: Home   Disposition:  SNF   Recommendations for Outpatient Follow-up:   Follow up with PCP in 1-2 weeks  PCP Please obtain BMP/CBC, 2 view CXR in 1week,  (see Discharge instructions)   PCP Please follow up on the following pending results:    Home Health: None   Equipment/Devices: None  Consultations: Ortho Discharge Condition: Stable    CODE STATUS: Full    Diet Recommendation: Renal diet with 1.2 L/day total fluid restriction.     Chief Complaint  Patient presents with  . Fall  . Missed Dialysis     Brief history of present illness from the day of admission and additional interim summary    74 year old female with history of end-stage renal disease on hemodialysis, diabetes mellitus type 2, hypertension, DJD, MSSA bacteremia in January 2021 presented on 06/07/2019 after a fall.  Apparently, she went to her dialysis center and as per schedule on 06/06/2019 but could not get her dialysis done due to issues with her AV fistula, subsequently went back home and then subsequently fell down, unclear if she had a syncopal event.  In the ED, patient was confused with creatinine of 13, potassium of 6.9 and bicarb of 14.  Nephrology was consulted.  She was also found to have left hip fracture for which orthopedics was consulted.                                                                 Hospital Course     Left hip fracture status post fall - she is s/p left hip fracture surgical repair on 06/10/2019, weightbearing as tolerated, Lovenox for DVT prophylaxis for 3 more weeks, continue PT OT and discharge to SNF.   ESRD on hemodialysis, Uremia with Aacute metabolic  encephalopathy to missed dialysis and malfunctioning of her dialysis fistula. She has had temporary right IJ dialysis catheter placed this admission and dialysis has been started, now back on MWF schedule.  Nephrology following.  Mental status improving.  She underwent left upper extremity fistula recannulation on 06/13/2019 unfortunately dialysis team unable to access it on 06/15/2019, subsequently right IJ tunneled dialysis catheter was placed by vascular surgery on 06/15/2019, case was discussed with nephrology she will be discharged today with outpatient dialysis treatments per schedule.  Leukocytosis - Probably reactive.  Improving  Mildly elevated high-sensitivity troponin - No chest pain. Likely secondary to ESRD.  No further work-up needed.  Hypertension -Norvasc added to home regimen.  Stable.  Anemia of chronic disease with mild perioperative blood loss related anemia.  She is Jehov[employer]'s Witness.  Monitor on oral iron currently no need for transfusion.  Generalized deconditioning - continue PT  OT, discharge to SNF.Marland Kitchen  Constipation and nausea.  Abdominal exam and abdominal x-ray unremarkable, is on bowel regimen, enema today will be given prior to discharge as well.  Mild delirium, fall night of 06/12/2019.  Patient had a fall in the hospital with right-sided rami fracture, supportive care, discussed with orthopedic doctor Xu.  Pain control and fall precautions at SNF.  Continue PT OT.  Discharge diagnosis     Principal Problem:   Left displaced femoral neck fracture [facility]) Active Problems:   Syncope    Discharge instructions    Discharge Instructions    Discharge instructions   Complete by: As directed    Note this is a very high fall risk patient   Follow with Primary MD [name], MD in 7 days   Get CBC, CMP, 2 view Chest X ray -  checked next visit within 1 week by Primary MD or SNF MD   Activity: As tolerated with Full fall precautions use walker/cane &  assistance as needed  Disposition Home    Diet: Renal diet with 1.2 L fluid restriction per day.  Special Instructions: If you have smoked or chewed Tobacco  in the last 2 yrs please stop smoking, stop any regular Alcohol  and or any Recreational drug use.  On your next visit with your primary care physician please Get Medicines reviewed and adjusted.  Please request your Prim.MD to go over all Hospital Tests and Procedure/Radiological results at the follow up, please get all Hospital records sent to your Prim MD by signing hospital release before you go home.  If you experience worsening of your admission symptoms, develop shortness of breath, life threatening emergency, suicidal or homicidal thoughts you must seek medical attention immediately by calling 911 or calling your MD immediately  if symptoms less severe.  You Must read complete instructions/literature along with all the possible adverse reactions/side effects for all the Medicines you take and that have been prescribed to you. Take any new Medicines after you have completely understood and accpet all the possible adverse reactions/side effects.   Weight bearing as tolerated   Complete by: As directed       Discharge Medications   Allergies as of 06/16/2019   No Known Allergies     Medication List    STOP taking these medications   Oxycodone HCl 10 MG Tabs     TAKE these medications   acetaminophen 650 MG CR tablet Commonly known as: TYLENOL Take 1,300 mg by mouth every 8 (eight) hours as needed for pain.   amLODipine 10 MG tablet Commonly known as: NORVASC Take 1 tablet (10 mg total) by mouth daily. Start taking on: June 17, 2019   atorvastatin 10 MG tablet Commonly known as: LIPITOR Take 10 mg by mouth at bedtime.   Auryxia 1 GM 210 MG(Fe) tablet Generic drug: ferric citrate Take 420 mg by mouth 3 (three) times daily with meals.   enoxaparin 30 MG/0.3ML injection Commonly known as: LOVENOX Inject 0.3  mLs (30 mg total) into the skin daily.   escitalopram 10 MG tablet Commonly known as: LEXAPRO Take 10 mg by mouth daily.   Fish Oil 1000 MG Caps Take 1,000 mg by mouth 2 (two) times daily.   furosemide 40 MG tablet Commonly known as: LASIX Take 1 tablet (40 mg total) by mouth every Tuesday, Thursday, Saturday, and Sunday. What changed: how much to take   labetalol 200 MG tablet Commonly known as: NORMODYNE Take 200 mg by  mouth 2 (two) times daily.   lidocaine-prilocaine cream Commonly known as: EMLA Apply 1 application topically as directed. Prior to Dialysis   LOPERAMIDE HCL PO Take 4 mg by mouth every 4 (four) hours as needed for diarrhea or loose stools.   multivitamin Tabs tablet Take 1 tablet by mouth at bedtime.   oxyCODONE-acetaminophen 5-325 MG tablet Commonly known as: Percocet Take 1-2 tablets by mouth every 8 (eight) hours as needed for severe pain.   polyethylene glycol 17 g packet Commonly known as: MiraLax Take 17 g by mouth daily as needed for moderate constipation.   QUEtiapine 25 MG tablet Commonly known as: SEROQUEL Take 0.5 tablets (12.5 mg total) by mouth at bedtime.   Turmeric 500 MG Caps Take 500 mg by mouth daily.   vitamin C 1000 MG tablet Take 1,000 mg by mouth 2 (two) times a day.            Discharge Care Instructions  (From admission, onward)         Start     Ordered   06/10/19 0000  Weight bearing as tolerated     06/10/19 1648          Follow-up Information    Leandrew Koyanagi, MD In 2 weeks.   Specialty: Orthopedic Surgery Why: For wound re-check, For suture removal Contact information: [place] [address] [phone]        [name], MD. Schedule an appointment as soon as possible for a visit in 1 week(s).   Specialty: Family Medicine Contact information: [address] [place] [address] [phone]           Major procedures and Radiology Reports -  PLEASE review detailed and final reports thoroughly  -       EEG  Result Date: 06/08/2019 Lora Havens, MD     06/08/2019  8:19 PM Patient Name: Pricella Gaugh MRN: [id number] Epilepsy Attending: Lora Havens Referring Physician/Provider: Dr Aline August Date: 06/08/2019 Duration: 24.58 mins Patient history: 73yo F with ams and twitching movements of bilateral upper extremities EEG to evaluate for seizure. Level of alertness: awake AEDs during EEG study: None Technical aspects: This EEG study was done with scalp electrodes positioned according to the 10-20 International system of electrode placement. Electrical activity was acquired at a sampling rate of '500Hz'$  and reviewed with a high frequency filter of '70Hz'$  and a low frequency filter of '1Hz'$ . EEG data were recorded continuously and digitally stored. DESCRIPTION: No clear posterior dominant rhythm was seen. EEG showed continuous generalized polymorphic 3-'6hz'$  theta-delta slowing. Photic driving was not seen during photic stimulation. Hyperventilation was not performed. ABNORMALITY - Continuous slow, generalized IMPRESSION: This study is suggestive of moderate diffuse encephalopathy, non specific to etiology. No seizures or epileptiform discharges were seen throughout the recording. Priyanka Barbra Sarks   CT ABDOMEN PELVIS WO CONTRAST  Result Date: 06/07/2019 CLINICAL DATA:  Abdominal trauma syncope, fall EXAM: CT CHEST WITHOUT CONTRAST TECHNIQUE: Multidetector CT imaging of the chest was performed following the standard protocol without IV contrast. COMPARISON:  October 28, 2013 FINDINGS: Cardiovascular: There is mild cardiomegaly. No significant pericardial fluid/thickening. Great vessels are normal in course and caliber. Limited evaluation of the vascular structures due to the lack of intravenous contrast. Scattered aortic atherosclerosis. Coronary artery calcifications are present. Mediastinum/Nodes: No pneumomediastinum. No large mediastinal hematoma.  The trachea and esophagus are grossly unremarkable. Lungs/Pleura:Scattered areas of bronchiectasis with architectural distortion are seen predominantly within both  lung apices and at the posterior lower lungs. Centrilobular emphysematous changes seen at both lung apices. No pneumothorax. No pleural effusion. Musculoskeletal: Nondisplaced fractures are seen through the anterior right fourth and fifth ribs. Abdomen/pelvis: Hepatobiliary: Homogeneous hepatic attenuation without traumatic injury. No focal lesion. Gallbladder physiologically distended, no calcified stone. No biliary dilatation. Pancreas: No evidence for traumatic injury. Portions are partially obscured by adjacent bowel loops and paucity of intra-abdominal fat. No ductal dilatation or inflammation. Spleen: Again noted is a 5 cm low-density lesion in the posterior spleen. Normal in size. Adrenals/Urinary Tract: No adrenal hemorrhage. Bilateral renal atrophy is noted. There is a partially visualized low-density lesions seen within the lower pole the left kidney measuring 1.2 cm. Bladder is physiologically distended without wall thickening. Stomach/Bowel: Suboptimally assessed without enteric contrast, allowing for this, no evidence of bowel injury. Stomach physiologically distended. There are no dilated or thickened small or large bowel loops. Moderate stool burden. Scattered colonic diverticula are noted. No evidence of mesenteric hematoma. No free air free fluid. Vascular/Lymphatic: No acute vascular injury. The abdominal aorta and IVC are intact. No evidence of retroperitoneal, abdominal, or pelvic adenopathy. Scattered aortic atherosclerosis is noted. Reproductive: No acute abnormality. Partially calcified posterior uterine fibroid is seen. Other: No focal contusion or abnormality of the abdominal wall. A small fat containing anterior umbilical hernia is noted. Musculoskeletal: There is a comminuted mildly displaced left transoral femoral neck  fracture. The femoral head is still well seated within the acetabulum. The patient is status post ORIF with plate and screw fixation of the posterior right acetabulum. There is a grade 1 anterolisthesis of L5 on S1. There is progression of endplate sclerosis and reactive changes with erosions at L5-S1. IMPRESSION: 1. No acute intrathoracic, abdominal, or pelvic injury, h wall a owever somewhat limited due to lack of intravenous contrast. 2. Nondisplaced fractures through the right anterior fourth and fifth ribs. 3. Areas of chronic interstitial lung changes and scarring seen throughout both lungs with centrilobular emphysematous changes at the lung apices. 4. Diverticulosis without diverticulitis. 5.  Aortic Atherosclerosis (ICD10-I70.0). 6. Comminuted mildly displaced left transcervical femoral neck fracture. 7. Grade 1 anterolisthesis of L5 on S1 with advanced degenerative changes as on prior radiograph dating back to 2019. Electronically Signed   By: Prudencio Pair M.D.   On: 06/07/2019 13:16   DG Chest 1 View  Result Date: 06/07/2019 CLINICAL DATA:  Syncopal episode. EXAM: CHEST  1 VIEW COMPARISON:  04/08/2019 chest radiograph and prior. FINDINGS: Overlying telemetry wires obscure visualization of the chest. Bilateral upper lung scarring and right predominant volume loss, unchanged. Patchy and streaky bibasilar opacities likely reflect atelectasis. No pneumothorax or pleural effusion. Cardiomediastinal silhouette is grossly unchanged. Partially imaged left upper extremity vascular stent. Osteopenia and multilevel spondylosis. Aortic atherosclerotic calcifications. IMPRESSION: No active airspace disease. Biapical scarring with right upper lung volume loss, grossly unchanged. Electronically Signed   By: Primitivo Gauze M.D.   On: 06/07/2019 12:44   DG Pelvis 1-2 Views  Result Date: 06/07/2019 CLINICAL DATA:  Syncope, fall, left hip pain EXAM: PELVIS - 1-2 VIEW; LEFT FEMUR 2 VIEWS COMPARISON:  None.  FINDINGS: Osteopenia. There is a displaced transcervical fracture of the left femoral neck. No fracture or dislocation of the distal left femur. Plate and screw fixation of the right acetabulum. No acute displaced fracture or dislocation of the pelvis or proximal right femur in frontal view only. IMPRESSION: 1. There is a displaced transcervical fracture of the left femoral neck. 2.  No fracture or  dislocation of the distal left femur. 3. Osteopenia, which limits sensitivity for additional fractures of the pelvis. Electronically Signed   By: Eddie Candle M.D.   On: 06/07/2019 12:32   DG Shoulder Right  Result Date: 06/12/2019 CLINICAL DATA:  Fall EXAM: RIGHT SHOULDER - 2+ VIEW COMPARISON:  04/05/2019 FINDINGS: Moderate AC joint degenerative change. Mild glenohumeral degenerative change. No fracture or dislocation. High-riding appearance of the humeral head with effacement of subacromial space. Right-sided central venous catheter tip over the right atrium. Fibrosis and bronchiectasis within the right lung apex IMPRESSION: 1. No acute osseous abnormality. 2. Degenerative changes of the [facility] joint and glenohumeral joint 3. High-riding humeral head consistent with rotator cuff disease 4. Fibrosis and bronchiectasis in the right apex Electronically Signed   By: Donavan Foil M.D.   On: 06/12/2019 23:41   CT HEAD WO CONTRAST  Result Date: 06/13/2019 CLINICAL DATA:  Initial evaluation for acute head trauma, fall, weakness. EXAM: CT HEAD WITHOUT CONTRAST TECHNIQUE: Contiguous axial images were obtained from the base of the skull through the vertex without intravenous contrast. COMPARISON:  Prior head CT from 06/07/2019. FINDINGS: Brain: Generalized age-related cerebral atrophy with chronic small vessel ischemic disease, stable. Scattered remote lacunar infarcts present within the bilateral basal ganglia and thalami. No acute intracranial hemorrhage. No acute large vessel territory infarct. No mass lesion, midline  shift or mass effect. No hydrocephalus or extra-axial fluid collection. Vascular: No hyperdense vessel. Calcified atherosclerosis present at skull base. Skull: Scalp soft tissues within normal limits.  Calvarium intact. Sinuses/Orbits: Globes and orbital soft tissues within normal limits. Paranasal sinuses are largely clear. No mastoid effusion. Other: None. IMPRESSION: 1. Stable head CT.  No acute intracranial abnormality identified. 2. Age-related cerebral atrophy with chronic small vessel ischemic disease, with scattered remote lacunar infarcts involving the bilateral basal ganglia and thalami. Electronically Signed   By: Jeannine Boga M.D.   On: 06/13/2019 02:14   CT Head Wo Contrast  Result Date: 06/07/2019 CLINICAL DATA:  Fall at home.  Head injury EXAM: CT HEAD WITHOUT CONTRAST CT CERVICAL SPINE WITHOUT CONTRAST TECHNIQUE: Multidetector CT imaging of the head and cervical spine was performed following the standard protocol without intravenous contrast. Multiplanar CT image reconstructions of the cervical spine were also generated. COMPARISON:  None. FINDINGS: CT HEAD FINDINGS Brain: Mild atrophy without hydrocephalus. Patchy white matter hypodensity diffusely compatible with chronic microvascular ischemia. No acute hemorrhage, mass, or infarction. Small interhemispheric lipomas are present along the falx. This is felt to be incidental. Corpus callosum normally formed. Vascular: Negative for hyperdense vessel Skull: Negative Sinuses/Orbits: Paranasal sinuses clear. Bilateral cataract extraction. Other: None CT CERVICAL SPINE FINDINGS Alignment: Normal alignment with straightening of the cervical lordosis Skull base and vertebrae: Negative for fracture Soft tissues and spinal canal: No soft tissue mass or edema. Disc levels: Disc degeneration and spurring C4-5 and C5-6. Moderate right foraminal stenosis at C5-6 due to spurring. Mild disc degeneration and spurring on the left at C6-7. Upper chest: CT  chest reported separately. Other: None IMPRESSION: 1. No acute intracranial abnormality 2. Negative for cervical spine fracture. Mild to moderate cervical spondylosis. Electronically Signed   By: Franchot Gallo M.D.   On: 06/07/2019 12:57   CT Chest Wo Contrast  Result Date: 06/07/2019 CLINICAL DATA:  Abdominal trauma syncope, fall EXAM: CT CHEST WITHOUT CONTRAST TECHNIQUE: Multidetector CT imaging of the chest was performed following the standard protocol without IV contrast. COMPARISON:  October 28, 2013 FINDINGS: Cardiovascular: There is mild cardiomegaly. No significant  pericardial fluid/thickening. Great vessels are normal in course and caliber. Limited evaluation of the vascular structures due to the lack of intravenous contrast. Scattered aortic atherosclerosis. Coronary artery calcifications are present. Mediastinum/Nodes: No pneumomediastinum. No large mediastinal hematoma. The trachea and esophagus are grossly unremarkable. Lungs/Pleura:Scattered areas of bronchiectasis with architectural distortion are seen predominantly within both lung apices and at the posterior lower lungs. Centrilobular emphysematous changes seen at both lung apices. No pneumothorax. No pleural effusion. Musculoskeletal: Nondisplaced fractures are seen through the anterior right fourth and fifth ribs. Abdomen/pelvis: Hepatobiliary: Homogeneous hepatic attenuation without traumatic injury. No focal lesion. Gallbladder physiologically distended, no calcified stone. No biliary dilatation. Pancreas: No evidence for traumatic injury. Portions are partially obscured by adjacent bowel loops and paucity of intra-abdominal fat. No ductal dilatation or inflammation. Spleen: Again noted is a 5 cm low-density lesion in the posterior spleen. Normal in size. Adrenals/Urinary Tract: No adrenal hemorrhage. Bilateral renal atrophy is noted. There is a partially visualized low-density lesions seen within the lower pole the left kidney measuring 1.2  cm. Bladder is physiologically distended without wall thickening. Stomach/Bowel: Suboptimally assessed without enteric contrast, allowing for this, no evidence of bowel injury. Stomach physiologically distended. There are no dilated or thickened small or large bowel loops. Moderate stool burden. Scattered colonic diverticula are noted. No evidence of mesenteric hematoma. No free air free fluid. Vascular/Lymphatic: No acute vascular injury. The abdominal aorta and IVC are intact. No evidence of retroperitoneal, abdominal, or pelvic adenopathy. Scattered aortic atherosclerosis is noted. Reproductive: No acute abnormality. Partially calcified posterior uterine fibroid is seen. Other: No focal contusion or abnormality of the abdominal wall. A small fat containing anterior umbilical hernia is noted. Musculoskeletal: There is a comminuted mildly displaced left transoral femoral neck fracture. The femoral head is still well seated within the acetabulum. The patient is status post ORIF with plate and screw fixation of the posterior right acetabulum. There is a grade 1 anterolisthesis of L5 on S1. There is progression of endplate sclerosis and reactive changes with erosions at L5-S1. IMPRESSION: 1. No acute intrathoracic, abdominal, or pelvic injury, h wall a owever somewhat limited due to lack of intravenous contrast. 2. Nondisplaced fractures through the right anterior fourth and fifth ribs. 3. Areas of chronic interstitial lung changes and scarring seen throughout both lungs with centrilobular emphysematous changes at the lung apices. 4. Diverticulosis without diverticulitis. 5.  Aortic Atherosclerosis (ICD10-I70.0). 6. Comminuted mildly displaced left transcervical femoral neck fracture. 7. Grade 1 anterolisthesis of L5 on S1 with advanced degenerative changes as on prior radiograph dating back to 2019. Electronically Signed   By: Prudencio Pair M.D.   On: 06/07/2019 13:16   CT Cervical Spine Wo Contrast  Result Date:  06/07/2019 CLINICAL DATA:  Fall at home.  Head injury EXAM: CT HEAD WITHOUT CONTRAST CT CERVICAL SPINE WITHOUT CONTRAST TECHNIQUE: Multidetector CT imaging of the head and cervical spine was performed following the standard protocol without intravenous contrast. Multiplanar CT image reconstructions of the cervical spine were also generated. COMPARISON:  None. FINDINGS: CT HEAD FINDINGS Brain: Mild atrophy without hydrocephalus. Patchy white matter hypodensity diffusely compatible with chronic microvascular ischemia. No acute hemorrhage, mass, or infarction. Small interhemispheric lipomas are present along the falx. This is felt to be incidental. Corpus callosum normally formed. Vascular: Negative for hyperdense vessel Skull: Negative Sinuses/Orbits: Paranasal sinuses clear. Bilateral cataract extraction. Other: None CT CERVICAL SPINE FINDINGS Alignment: Normal alignment with straightening of the cervical lordosis Skull base and vertebrae: Negative for fracture Soft tissues and  spinal canal: No soft tissue mass or edema. Disc levels: Disc degeneration and spurring C4-5 and C5-6. Moderate right foraminal stenosis at C5-6 due to spurring. Mild disc degeneration and spurring on the left at C6-7. Upper chest: CT chest reported separately. Other: None IMPRESSION: 1. No acute intracranial abnormality 2. Negative for cervical spine fracture. Mild to moderate cervical spondylosis. Electronically Signed   By: Franchot Gallo M.D.   On: 06/07/2019 12:57   MR BRAIN WO CONTRAST  Result Date: 06/08/2019 CLINICAL DATA:  Follow-up examination for acute confusion, metabolic encephalopathy, evaluate for possible stroke. EXAM: MRI HEAD WITHOUT CONTRAST TECHNIQUE: Multiplanar, multiecho pulse sequences of the brain and surrounding structures were obtained without intravenous contrast. COMPARISON:  Prior CT from 06/07/2019 as well as recent brain MRI from 02/28/2019. FINDINGS: Brain: Examination moderately degraded by motion artifact.  Generalized age-related cerebral atrophy. Patchy T2/FLAIR hyperintensity within the periventricular and deep white matter both cerebral hemispheres most consistent with chronic small vessel ischemic disease, moderate in nature. Patchy involvement of the pons present. 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 No abnormal foci of restricted diffusion to suggest acute or subacute ischemia. Gray-white matter differentiation maintained. No encephalomalacia to suggest chronic cortical infarction. No foci of susceptibility artifact to suggest acute or chronic intracranial hemorrhage. No mass lesion, midline shift or mass effect. Ventricles normal size without hydrocephalus. No extra-axial fluid collection. No made of an empty sella. Midline structures intact. Vascular: Major intracranial vascular flow voids are maintained. Skull and upper cervical spine: Craniocervical junction within normal limits. Bone marrow signal intensity normal. No scalp soft tissue abnormality. Sinuses/Orbits: Patient status post bilateral ocular lens replacement. Globes and orbital soft tissues demonstrate no acute finding. Paranasal sinuses are clear. Trace left mastoid effusion noted, of doubtful significance. Inner ear structures grossly normal. Other: None. IMPRESSION: 1. No acute intracranial infarct or other abnormality. 2. Age-related cerebral atrophy with moderate chronic small vessel ischemic disease, stable from previous. Electronically Signed   By: Jeannine Boga M.D.   On: 06/08/2019 23:56   DG Pelvis Portable  Result Date: 06/11/2019 CLINICAL DATA:  Status post hip replacement. EXAM: PORTABLE PELVIS 1-2 VIEWS COMPARISON:  None. FINDINGS: The patient is status post left hip replacement. Hardware is in good position. No other acute abnormalities. IMPRESSION: Left hip replacement as above. Electronically Signed   By: Dorise Bullion III M.D   On: 06/11/2019 13:08   PERIPHERAL  VASCULAR CATHETERIZATION  Result Date: 06/13/2019 OPERATIVE NOTE   PROCEDURE: 1. left brachiobasilic arteriovenous fistula cannulation under ultrasound guidance 2. left arm fistulogram including central venogram 3. left peripheral mid upper arm venoplasty (6 mm x 40 mm Mustang and 7 mm x 40 mm drug coated Ranger) 4. left peripheral distal upper arm venoplasty at arterial anastomosis (4 mm x 40 mm Sterling and 5 mm x 60 mm drug coated Ranger)   PRE-OPERATIVE DIAGNOSIS: Malfunctioning left arteriovenous fistula  POST-OPERATIVE DIAGNOSIS: same as above  SURGEON: Marty Heck, MD  ANESTHESIA: local  ESTIMATED BLOOD LOSS: 5 cc  FINDING(S): 1. Initially accessed the fistula antegrade and noted a 60% stenosis in the mid upper arm basilic vein fistula that was treated with a 6 mm Mustang and then 7 mm drug-coated Ranger.  On retrograde reflux shot there was a second near 95% stenosis in the distal upper arm near the arterial anastomosis.  Subsequently accessed the fistula retrograde through a second sheath and this was angioplastied across the arterial anastomosis with a 4 mm Sterling  and then 5 mm drug-coated Ranger.  Excellent thrill upon completion with no residual stenosis.   SPECIMEN(S):  None  CONTRAST: 50 cc  INDICATIONS: Fartun Paradiso is a 74 y.o. female who presents with malfunctioning left brachiobasilic arteriovenous fistula.  The patient is scheduled for left arm fistulogram.  The patient is aware the risks include but are not limited to: bleeding, infection, thrombosis of the cannulated access, and possible anaphylactic reaction to the contrast.  The patient is aware of the risks of the procedure and elects to proceed forward.  DESCRIPTION: After full informed written consent was obtained, the patient was brought back to the angiography suite and placed supine upon the angiography table.  The patient was connected to monitoring equipment.  The left arm was prepped and draped in the  standard fashion for a left arm fistulogram.  Under ultrasound guidance, the left arteriovenous fistula was evaluated, it was patent, an image was saved.  It was cannulated with a micropuncture needle.  The microwire was advanced into the fistula and the needle was exchanged for the a microsheath, which was lodged 2 cm into the access.  The wire was removed and the sheath was connected to the IV extension tubing.  Hand injections were completed to image the access from the antecubitum up to the level of axilla.  The central venous structures were also imaged by hand injections.  Patient was given 3000 units of IV heparin.  We initially elected to intervene on a mid upper arm stenosis approximately 60% just distal to the existing stent.  We crossed this with a Bentson wire and placed a short 7 [place] sheath.  We then angioplastied this with a 6 mm x 40 mm Mustang and then 7 mm x 40 mm drug-coated Ranger.  During inflation we got a retrograde shot that showed a second lesion in the distal upper arm near the arterial anastomosis.  We then removed the sheath and a 4-0 pursestring was tied down.  I then used ultrasound and reaccessed the fistula retrograde toward the arterial anastomosis with a microwire and placed a micro sheath.  I then advanced a V 18 wire across the arterial anastomosis up the brachial artery retrograde.  I then placed a short 5 [place] sheath.  I then angioplastied the distal upper arm basilic near the arterial anastomosis with a 4 mm Sterling and then a 5 mm drug-coated Ranger.  I then shot a final retrograde shot that showed no residual stenosis.  Patient had an excellent thrill.  Wire was removed.  A 4-0 Monocryl pursestring was then tied down and sheath was removed.  COMPLICATIONS: None  CONDITION: Stable  Marty Heck, MD [facility] Office: [phone]  IR Fluoro Guide CV Line Right  Result Date: 06/08/2019 INDICATION: 74 year old female referred  for hemodialysis catheter placement EXAM: IMAGE GUIDED PLACEMENT OF TEMPORARY HEMODIALYSIS CATHETER MEDICATIONS: NONE ANESTHESIA/SEDATION: NONE FLUOROSCOPY TIME:  Fluoroscopy Time: 0 minutes 6 seconds ( mGy). COMPLICATIONS: None PROCEDURE: Informed written consent was obtained from the patient's family after a discussion of the risks, benefits, and alternatives to treatment. Questions regarding the procedure were encouraged and answered. The right neck was prepped with chlorhexidine in a sterile fashion, and a sterile drape was applied covering the operative field. Maximum barrier sterile technique with sterile gowns and gloves were used for the procedure. A timeout was performed prior to the initiation of the procedure. A micropuncture kit was utilized to access the right internal jugular vein under  direct, real-time ultrasound guidance after the overlying soft tissues were anesthetized with 1% lidocaine with epinephrine. Ultrasound image documentation was performed. The microwire was kinked to measure appropriate catheter length. A stiff glidewire was advanced to the level of the IVC. A 19 cm hemodialysis catheter was then placed over the wire. Final catheter positioning was confirmed and documented with a spot radiographic image. The catheter aspirates and flushes normally. The catheter was flushed with appropriate volume heparin dwells. Dressings were applied. The patient tolerated the procedure well without immediate post procedural complication. IMPRESSION: Status post right IJ temporary hemodialysis catheter placement Signed, Dulcy Fanny. Dellia Nims, RPVI [facility] Electronically Signed   By: Corrie Mckusick D.O.   On: 06/08/2019 11:49   IR US Guide Vasc Access Right  Result Date: 06/08/2019 INDICATION: 74 year old female referred for hemodialysis catheter placement EXAM: IMAGE GUIDED PLACEMENT OF TEMPORARY HEMODIALYSIS CATHETER MEDICATIONS: NONE  ANESTHESIA/SEDATION: NONE FLUOROSCOPY TIME:  Fluoroscopy Time: 0 minutes 6 seconds ( mGy). COMPLICATIONS: None PROCEDURE: Informed written consent was obtained from the patient's family after a discussion of the risks, benefits, and alternatives to treatment. Questions regarding the procedure were encouraged and answered. The right neck was prepped with chlorhexidine in a sterile fashion, and a sterile drape was applied covering the operative field. Maximum barrier sterile technique with sterile gowns and gloves were used for the procedure. A timeout was performed prior to the initiation of the procedure. A micropuncture kit was utilized to access the right internal jugular vein under direct, real-time ultrasound guidance after the overlying soft tissues were anesthetized with 1% lidocaine with epinephrine. Ultrasound image documentation was performed. The microwire was kinked to measure appropriate catheter length. A stiff glidewire was advanced to the level of the IVC. A 19 cm hemodialysis catheter was then placed over the wire. Final catheter positioning was confirmed and documented with a spot radiographic image. The catheter aspirates and flushes normally. The catheter was flushed with appropriate volume heparin dwells. Dressings were applied. The patient tolerated the procedure well without immediate post procedural complication. IMPRESSION: Status post right IJ temporary hemodialysis catheter placement Signed, Dulcy Fanny. Dellia Nims, RPVI [facility] Radiology Electronically Signed   By: Corrie Mckusick D.O.   On: 06/08/2019 11:49   DG Abd Portable 1V  Result Date: 06/12/2019 CLINICAL DATA:  Nausea and vomiting. EXAM: PORTABLE ABDOMEN - 1 VIEW COMPARISON:  CT 06/07/2019 and pelvis 06/11/2019 FINDINGS: Bowel gas pattern is nonobstructive. No free peritoneal air. Degenerate change of the spine. Fixation hardware over the right acetabular region intact. Left hip  arthroplasty intact. IMPRESSION: Nonobstructive bowel gas pattern. Electronically Signed   By: Marin Olp M.D.   On: 06/12/2019 10:26   DG C-Arm 1-60 Min  Result Date: 06/10/2019 CLINICAL DATA:  Left hip replacement EXAM: OPERATIVE LEFT HIP (WITH PELVIS IF PERFORMED) 4 VIEWS TECHNIQUE: Fluoroscopic spot image(s) were submitted for interpretation post-operatively. COMPARISON:  06/07/2019 FINDINGS: Intraoperative spot images demonstrate changes of left hip replacement. Normal AP alignment. No hardware or bony complicating feature. IMPRESSION: Left hip replacement.  No visible complicating feature. Electronically Signed   By: Rolm Baptise M.D.   On: 06/10/2019 18:47   DG HIP UNILAT WITH PELVIS 1V RIGHT  Result Date: 06/12/2019 CLINICAL DATA:  Altered mental status, fall EXAM: DG HIP (WITH OR WITHOUT PELVIS) 1V RIGHT COMPARISON:  06/11/2019, 06/07/2019 FINDINGS: Status post left hip replacement with normal alignment. Gas in the hip soft tissues consistent with recent surgery.  Surgical plate and fixating screws in the right acetabulum and inferior pubic ramus. New acute mildly comminuted fracture of the right superior pubic ramus. Calcified uterine fibroid IMPRESSION: 1. New acute mildly comminuted fracture involving the right superior pubic ramus. 2. Stable postsurgical changes of the right acetabulum. 3. Status post left hip replacement with postsurgical changes. Electronically Signed   By: Donavan Foil M.D.   On: 06/12/2019 23:38   DG HIP OPERATIVE UNILAT W OR W/O PELVIS LEFT  Result Date: 06/10/2019 CLINICAL DATA:  Left hip replacement EXAM: OPERATIVE LEFT HIP (WITH PELVIS IF PERFORMED) 4 VIEWS TECHNIQUE: Fluoroscopic spot image(s) were submitted for interpretation post-operatively. COMPARISON:  06/07/2019 FINDINGS: Intraoperative spot images demonstrate changes of left hip replacement. Normal AP alignment. No hardware or bony complicating feature. IMPRESSION: Left hip replacement.  No visible  complicating feature. Electronically Signed   By: Rolm Baptise M.D.   On: 06/10/2019 18:47   DG FEMUR MIN 2 VIEWS LEFT  Result Date: 06/07/2019 CLINICAL DATA:  Syncope, fall, left hip pain EXAM: PELVIS - 1-2 VIEW; LEFT FEMUR 2 VIEWS COMPARISON:  None. FINDINGS: Osteopenia. There is a displaced transcervical fracture of the left femoral neck. No fracture or dislocation of the distal left femur. Plate and screw fixation of the right acetabulum. No acute displaced fracture or dislocation of the pelvis or proximal right femur in frontal view only. IMPRESSION: 1. There is a displaced transcervical fracture of the left femoral neck. 2.  No fracture or dislocation of the distal left femur. 3. Osteopenia, which limits sensitivity for additional fractures of the pelvis. Electronically Signed   By: Eddie Candle M.D.   On: 06/07/2019 12:32   HYBRID OR IMAGING ([facility] ONLY)  Result Date: 06/15/2019 There is no interpretation for this exam.  This order is for images obtained during a surgical procedure.  Please See "Surgeries" Tab for more information regarding the procedure.    Micro Results    Recent Results (from the past 240 hour(s))  Respiratory Panel by RT PCR (Flu A&B, Covid) - Nasopharyngeal Swab     Status: None   Collection Time: 06/07/19  1:03 PM   Specimen: Nasopharyngeal Swab  Result Value Ref Range Status   SARS Coronavirus 2 by RT PCR NEGATIVE NEGATIVE Final    Comment: (NOTE) SARS-CoV-2 target nucleic acids are NOT DETECTED. The SARS-CoV-2 RNA is generally detectable in upper respiratoy specimens during the acute phase of infection. The lowest concentration of SARS-CoV-2 viral copies this assay can detect is 131 copies/mL. A negative result does not preclude SARS-Cov-2 infection and should not be used as the sole basis for treatment or other patient management decisions. A negative result may occur with  improper specimen collection/handling, submission of specimen other than nasopharyngeal  swab, presence of viral mutation(s) within the areas targeted by this assay, and inadequate number of viral copies (<131 copies/mL). A negative result must be combined with clinical observations, patient history, and epidemiological information. The expected result is Negative. Fact Sheet for Patients:  PinkCheek.be Fact Sheet for Healthcare Providers:  GravelBags.it This test is not yet ap proved or cleared by the [place] FDA and  has been authorized for detection and/or diagnosis of SARS-CoV-2 by FDA under an Emergency Use Authorization (EUA). This EUA will remain  in effect (meaning this test can be used) for the duration of the COVID-19 declaration under Section 564(b)(1) of the Act, 21 U.S.C. section 360bbb-3(b)(1), unless the authorization is terminated or revoked sooner.    Influenza A by  PCR NEGATIVE NEGATIVE Final   Influenza B by PCR NEGATIVE NEGATIVE Final    Comment: (NOTE) The Xpert Xpress SARS-CoV-2/FLU/RSV assay is intended as an aid in  the diagnosis of influenza from Nasopharyngeal swab specimens and  should not be used as a sole basis for treatment. Nasal washings and  aspirates are unacceptable for Xpert Xpress SARS-CoV-2/FLU/RSV  testing. Fact Sheet for Patients: PinkCheek.be Fact Sheet for Healthcare Providers: GravelBags.it This test is not yet approved or cleared by the [place] FDA and  has been authorized for detection and/or diagnosis of SARS-CoV-2 by  FDA under an Emergency Use Authorization (EUA). This EUA will remain  in effect (meaning this test can be used) for the duration of the  Covid-19 declaration under Section 564(b)(1) of the Act, 21  U.S.C. section 360bbb-3(b)(1), unless the authorization is  terminated or revoked. Performed at [facility], [place] [address], [place], [place] [address]   Culture, blood (routine x  2)     Status: None   Collection Time: 06/07/19  6:48 PM   Specimen: BLOOD  Result Value Ref Range Status   Specimen Description BLOOD RIGHT ANTECUBITAL  Final   Special Requests   Final    BOTTLES DRAWN AEROBIC AND ANAEROBIC Blood Culture adequate volume   Culture   Final    NO GROWTH 5 DAYS Performed at [facility], [place] [address], [place], [place] [address]    Report Status 06/12/2019 FINAL  Final  Culture, blood (routine x 2)     Status: None   Collection Time: 06/07/19 11:04 PM   Specimen: BLOOD  Result Value Ref Range Status   Specimen Description BLOOD RIGHT ANTECUBITAL  Final   Special Requests   Final    BOTTLES DRAWN AEROBIC AND ANAEROBIC Blood Culture adequate volume   Culture   Final    NO GROWTH 5 DAYS Performed at [facility], [place] [address] [place], [place] [address]    Report Status 06/13/2019 FINAL  Final  Surgical pcr screen     Status: None   Collection Time: 06/09/19  6:43 AM   Specimen: Nasal Mucosa; Nasal Swab  Result Value Ref Range Status   MRSA, PCR NEGATIVE NEGATIVE Final   Staphylococcus aureus NEGATIVE NEGATIVE Final    Comment: (NOTE) The Xpert SA Assay (FDA approved for NASAL specimens in patients 54 years of age and older), is one component of a comprehensive surveillance program. It is not intended to diagnose infection nor to guide or monitor treatment. Performed at [facility], [place] [address], [place], [place] [address]   SARS CORONAVIRUS 2 (TAT 6-24 HRS) Nasopharyngeal Nasopharyngeal Swab     Status: None   Collection Time: 06/14/19  4:04 PM   Specimen: Nasopharyngeal Swab  Result Value Ref Range Status   SARS Coronavirus 2 NEGATIVE NEGATIVE Final    Comment: (NOTE) SARS-CoV-2 target nucleic acids are NOT DETECTED. The SARS-CoV-2 RNA is generally detectable in upper and lower respiratory specimens during the acute phase of infection. Negative results do not preclude SARS-CoV-2 infection, do not rule  out co-infections with other pathogens, and should not be used as the sole basis for treatment or other patient management decisions. Negative results must be combined with clinical observations, patient history, and epidemiological information. The expected result is Negative. Fact Sheet for Patients: SugarRoll.be Fact Sheet for Healthcare Providers: https://www.woods-mathews.com/ This test is not yet approved or cleared by the [place] FDA and  has been  authorized for detection and/or diagnosis of SARS-CoV-2 by FDA under an Emergency Use Authorization (EUA). This EUA will remain  in effect (meaning this test can be used) for the duration of the COVID-19 declaration under Section 56 4(b)(1) of the Act, 21 U.S.C. section 360bbb-3(b)(1), unless the authorization is terminated or revoked sooner. Performed at [facility], [place] [address], [place], [place] [address]     Today   Subjective    Dore Oquin today has no headache,no chest abdominal pain,no new weakness tingling or numbness, feels much  better.     Objective   Blood pressure (!) 144/76, pulse 54, temperature 98.3 F (36.8 C), resp. rate 19, height 5' 7"  (1.702 m), weight 93 kg, SpO2 98 %.   Intake/Output Summary (Last 24 hours) at 06/16/2019 1007 Last data filed at 06/16/2019 0700 Gross per 24 hour  Intake 1439.17 ml  Output 1905 ml  Net -465.83 ml    Exam  Awake mildly confused, No new F.N deficits,   [place].AT,PERRAL Supple Neck,No JVD, No cervical lymphadenopathy appriciated.  Symmetrical Chest wall movement, Good air movement bilaterally, CTAB RRR,No Gallops,Rubs or new Murmurs, No Parasternal Heave +ve B.Sounds, Abd Soft, Non tender, No organomegaly appriciated, No rebound -guarding or rigidity. No Cyanosis, left hip postop site stable, right tunneled IJ HD catheter in place.    Data Review   CBC w Diff:  Lab Results  Component Value Date   WBC  9.8 06/15/2019   HGB 13.9 06/15/2019   HCT 41.0 06/15/2019   PLT 236 06/15/2019   LYMPHOPCT 12 06/11/2019   MONOPCT 12 06/11/2019   EOSPCT 2 06/11/2019   BASOPCT 0 06/11/2019    CMP:  Lab Results  Component Value Date   NA 135 06/15/2019   K 5.0 06/15/2019   CL 93 (L) 06/15/2019   CO2 23 06/15/2019   BUN 15 06/15/2019   CREATININE 2.70 (H) 06/15/2019   PROT 8.0 06/08/2019   ALBUMIN 3.5 06/08/2019   BILITOT 1.1 06/08/2019   ALKPHOS 77 06/08/2019   AST 55 (H) 06/08/2019   ALT <5 06/08/2019  .   Total Time in preparing paper work, data evaluation and todays exam - 76 minutes  Lala Lund M.D on 06/16/2019 at 10:07 AM  [facility]   Office  [phone]

## 2019-06-16 NOTE — Anesthesia Postprocedure Evaluation (Signed)
Anesthesia Post Note  Patient: Bianca Myers  Procedure(s) Performed: INSERTION OF TUNNELED  DIALYSIS CATHETER RIGHT INTERNAL JUGULAR (N/A )     Patient location during evaluation: PACU Anesthesia Type: MAC Level of consciousness: awake and alert Pain management: pain level controlled Vital Signs Assessment: post-procedure vital signs reviewed and stable Respiratory status: spontaneous breathing Cardiovascular status: stable Anesthetic complications: no    Last Vitals:  Vitals:   06/15/19 2038 06/16/19 0507  BP: 136/75 (!) 144/76  Pulse: 72 (!) 41  Resp: 17 19  Temp: 36.6 C 36.8 C  SpO2: 100% 98%    Last Pain:  Vitals:   06/15/19 2229  TempSrc:   PainSc: [place]

## 2019-06-16 NOTE — Discharge Instructions (Signed)
    1. Change dressings as needed 2. May shower but keep incisions covered and dry 3. Take lovenox to prevent blood clots 4. Take stool softeners as needed 5. Take pain meds as needed   Note this is a very high fall risk patient   Follow with Primary MD [name], MD in 7 days   Get CBC, CMP, 2 view Chest X ray -  checked next visit within 1 week by Primary MD or SNF MD   Activity: As tolerated with Full fall precautions use walker/cane & assistance as needed  Disposition Home    Diet: Renal diet with 1.2 L fluid restriction per day.  Special Instructions: If you have smoked or chewed Tobacco  in the last 2 yrs please stop smoking, stop any regular Alcohol  and or any Recreational drug use.  On your next visit with your primary care physician please Get Medicines reviewed and adjusted.  Please request your Prim.MD to go over all Hospital Tests and Procedure/Radiological results at the follow up, please get all Hospital records sent to your Prim MD by signing hospital release before you go home.  If you experience worsening of your admission symptoms, develop shortness of breath, life threatening emergency, suicidal or homicidal thoughts you must seek medical attention immediately by calling 911 or calling your MD immediately  if symptoms less severe.  You Must read complete instructions/literature along with all the possible adverse reactions/side effects for all the Medicines you take and that have been prescribed to you. Take any new Medicines after you have completely understood and accpet all the possible adverse reactions/side effects.

## 2019-06-16 NOTE — Progress Notes (Addendum)
[facility] Progress Note   Subjective: DC orders noted. S/P removal of T-cath, placement of [facility] 04/14 Dr. Trula Slade. Smiling, responding appropriately today. Says she feels much better, more like "herself".   Objective Vitals:   06/15/19 1444 06/15/19 1655 06/15/19 2038 06/16/19 0507  BP: (!) 142/82 113/70 136/75 (!) 144/76  Pulse: 78 67 72 (!) 41  Resp: 18 18 17 19   Temp: 97.7 F (36.5 C) 98.7 F (37.1 C) 97.9 F (36.6 C) 98.3 F (36.8 C)  TempSrc: Oral Oral Oral   SpO2: 100% 98% 100% 98%  Weight:      Height:       Physical Exam General:WN, WD elderly female in NAD Heart:S1,S2 RRR Lungs:CTAB Abdomen:S, NT Active BS Extremities:No LE edema Dialysis Access:RIJ TDC drsg CDI. L AVF + bruit bruising present   Additional Objective Labs: Basic Metabolic Panel: Recent Labs  Lab 06/11/19 0316 06/12/19 0416 06/13/19 0338 06/13/19 0338 06/14/19 0358 06/15/19 0404 06/15/19 1252  NA 133*   < > 133*   < > 134* 132* 135  K 4.4   < > 3.8   < > 4.0 4.2 5.0  CL 91*   < > 92*   < > 96* 93* 93*  CO2 23   < > 25  --  25 23  --   GLUCOSE 132*   < > 114*   < > 118* 131* 136*  BUN 58*   < > 47*   < > 26* 44* 15  CREATININE 7.63*   < > 6.48*   < > 4.20* 5.87* 2.70*  CALCIUM 8.3*   < > 8.0*  --  7.9* 8.5*  --   PHOS 9.1*  --   --   --   --   --   --    < > = values in this interval not displayed.   Liver Function Tests: No results for input(s): AST, ALT, ALKPHOS, BILITOT, PROT, ALBUMIN in the last 168 hours. No results for input(s): LIPASE, AMYLASE in the last 168 hours. CBC: Recent Labs  Lab 06/11/19 0316 06/11/19 0316 06/12/19 0416 06/12/19 0416 06/13/19 0338 06/13/19 0338 06/14/19 0358 06/15/19 0404 06/15/19 1252  WBC 9.5   < > 9.0   < > 9.8  --  9.1 9.8  --   NEUTROABS 7.0  --   --   --   --   --   --   --   --   HGB 10.1*   < > 10.1*   < > 9.2*   < > 9.6* 9.5* 13.9  HCT 31.9*   < > 31.4*   < > 28.7*   < > 30.6* 30.2* 41.0  MCV 98.5  --  97.2   --  96.6  --  96.5 97.4  --   PLT 195   < > 186   < > 189  --  217 236  --    < > = values in this interval not displayed.   Blood Culture    Component Value Date/Time   SDES BLOOD RIGHT ANTECUBITAL 06/07/2019 2304   SPECREQUEST  06/07/2019 2304    BOTTLES DRAWN AEROBIC AND ANAEROBIC Blood Culture adequate volume   CULT  06/07/2019 2304    NO GROWTH 5 DAYS Performed at [facility], [place] [address] [place], [place] [address]    REPTSTATUS 06/13/2019 FINAL 06/07/2019 2304    Cardiac Enzymes: No results for input(s): CKTOTAL, CKMB, CKMBINDEX, TROPONINI in the  last 168 hours. CBG: Recent Labs  Lab 06/13/19 0739 06/13/19 1118 06/14/19 0657 06/15/19 0644 06/15/19 1345  GLUCAP 109* 122* 162* 165* 105*   Iron Studies: No results for input(s): IRON, TIBC, TRANSFERRIN, FERRITIN in the last 72 hours. @lablastinr3 @ Studies/Results: HYBRID OR IMAGING ([facility] ONLY)  Result Date: 06/15/2019 There is no interpretation for this exam.  This order is for images obtained during a surgical procedure.  Please See "Surgeries" Tab for more information regarding the procedure.   Medications: . sodium chloride 100 mL/hr at 06/15/19 1339     Dialysis: [facility] MWF 4hr 180NRe350/800 77kg 2K/2.25Ca UFP 4 AVF  -Heparin 2000units IV TIW -Mircera 27mcg IV q 2 weeks(last 3/31)  Assessment/Plan: 1. Confusion/AMS on 4/7. Concern for seizure/uremia.AdmitBUN 120/Cr 14.5.EEG nonspecific.Close to baseline mentally today. Seems to be improving. 2. Left femoral neck fracture s/p syncopal event at home.S/P prosthetic replacement for femoral neck fx4/09/21per Dr. Erlinda Hong. 3. Hyperkalemia -Resolved with HD. 4. ESRD -HD MWF. HD tomorrow at OP center. Use new AVF cannulation protocol.  5. AVF malfunction - ongoing difficulty with cannulation. VVS consulted,F'gram 04/12 per Dr. Carlis Abbott. Mid upper arm venoplasty at arterial anastomosis, X 2. Had Temp cath removed, new TDC placed 04/14 per Dr.  Trula Slade.  6. Hypertension/volume -BP and volume controlled, UF as tolerated. Continueamlodipine 5 mg PO daily, labetalol 200mg  PObid. 7. Anemia - Hgb9.5. Willstart low dose Mircera as OP. PATIENT IS JEHOVAH WITNESS, DOES NOT ACCEPT BLOOD PRODUCTS. 8. Metabolic bone disease -Continue OP binders, no VDRA. 9. Nutrition -NPO at present.Renal diet/fluid restriction when eating. 10. Disposition- will need to have a tunneled dialysis catheter placed before discharge.   Rita H. Brown NP-C 06/16/2019, 10:53 AM  [facility] [phone]  I have seen and examined this patient and agree with plan and assessment in the above note with renal recommendations/intervention highlighted.  Will need to have ongoing vascular surgery follow up as an outpatient.  Broadus John A Pierre Dellarocco,MD 06/16/2019 1:11 PM

## 2019-06-16 NOTE — TOC Transition Note (Signed)
Transition of Care (TOC) - CM/SW Discharge Note 06/16/19 - Discharged to [facility] via non-emergency ambulance *Room 215 *Number to call Report - [phone]   Patient Details  Name: Bianca Myers MRN: [id number] Date of Birth: Jan 02, 1946  Transition of Care [facility] CM/SW Contact:  Sable Feil, LCSW Phone Number: 06/16/2019, 2:47 PM   Clinical Narrative: Patient medically stable for discharge and going to [facility]. Insurance authorization had been received by Towanda MalkinJosem Kaufmann RA#[id number] and [employer] contacted and auth date changed to 4/15 - 4/19 (5 days) and next review date 4/19. Contacted Lorrie with admissions at [facility] and provided updated auth information. Son contacted regarding d/c today and talked with him about admissions paperwork at SNF, clothing, etc.patient will need and answered his questions. Encouraged Mr. Anderson to contact, [place], admissions person with [facility].  Discharge clinicals transmitted to facility.       Final next level of care: Skilled Nursing Facility([facility]) Barriers to Discharge: Barriers Resolved   Patient Goals and CMS Choice Patient states their goals for this hospitalization and ongoing recovery are:: Patient agreeable to going to a faclity for ST rehab CMS Medicare.gov Compare Post Acute Care list provided to:: Patient Represenative (must comment)(Son-Jermaine Ouida Sills) Choice offered to / list presented to : Adult Children  Discharge Placement PASRR number recieved: (PASRR  number pending - Level 2 eval completed the morning of 4/15 and facility willing to accept patient with pending PASRR)            Patient chooses bed at: [facility] Patient to be transferred to facility by: Ambulance Name of family member notified: Son Doristine Bosworth - son; [phone] Patient and family notified of of transfer: 06/16/19  Discharge Plan and [place] for ST rehab services                                 Social Determinants of Health (SDOH) Interventions  No SDOH interventions requested or needed at discharge.   Readmission Risk Interventions No flowsheet data found.

## 2019-06-16 NOTE — Plan of Care (Signed)
°  Problem: Coping: °Goal: Level of anxiety will decrease °Outcome: Progressing °  °

## 2019-06-16 NOTE — Progress Notes (Addendum)
  Progress Note    06/16/2019 8:19 AM 1 Day Post-Op  Subjective:  Alert and oriented this morning.  Sitting upright eating breakfast during exam.   Vitals:   06/15/19 2038 06/16/19 0507  BP: 136/75 (!) 144/76  Pulse: 72 (!) 41  Resp: 17 19  Temp: 97.9 F (36.6 C) 98.3 F (36.8 C)  SpO2: 100% 98%   Physical Exam: Lungs:  Non labored Extremities:  L arm fistula with local ecchymosis; no firm hematoma; easily palpable thrill of fistula in distal to mid upper arm; palpable L radial pulse Neurologic: A&O  CBC    Component Value Date/Time   WBC 9.8 06/15/2019 0404   RBC 3.10 (L) 06/15/2019 0404   HGB 13.9 06/15/2019 1252   HCT 41.0 06/15/2019 1252   PLT 236 06/15/2019 0404   MCV 97.4 06/15/2019 0404   MCH 30.6 06/15/2019 0404   MCHC 31.5 06/15/2019 0404   RDW 18.1 (H) 06/15/2019 0404   LYMPHSABS 1.2 06/11/2019 0316   MONOABS 1.1 (H) 06/11/2019 0316   EOSABS 0.2 06/11/2019 0316   BASOSABS 0.0 06/11/2019 0316    BMET    Component Value Date/Time   NA 135 06/15/2019 1252   K 5.0 06/15/2019 1252   CL 93 (L) 06/15/2019 1252   CO2 23 06/15/2019 0404   GLUCOSE 136 (H) 06/15/2019 1252   BUN 15 06/15/2019 1252   CREATININE 2.70 (H) 06/15/2019 1252   CALCIUM 8.5 (L) 06/15/2019 0404   GFRNONAA 7 (L) 06/15/2019 0404   GFRAA 8 (L) 06/15/2019 0404    INR    Component Value Date/Time   INR 1.3 (H) 06/08/2019 0603     Intake/Output Summary (Last 24 hours) at 06/16/2019 0819 Last data filed at 06/16/2019 0601 Gross per 24 hour  Intake 1199.17 ml  Output 1905 ml  Net -705.83 ml     Assessment/Plan:  74 y.o. female is s/p R IJ TDC placement 1 Day Post-Op   R IJ TDC unremarkable L arm fistula patent with easily palpable thrill Ok for outpatient kidney center to attempt access in L arm fistula; patient has an appt with Dr. Oneida Alar in 2 weeks; will discuss new access at that time if still having trouble accessing fistula Plan noted for discharge to SNF  today   Dagoberto Ligas, PA-C [facility] [phone] 06/16/2019 8:19 AM   I have independently interviewed and examined patient and agree with PA assessment and plan above.  Fistula has a strong thrill.  He can use TDC until hematoma resolves and then hopefully fistula will work.  Otherwise we will plan new access as an outpatient.  Preslea Rhodus C. Donzetta Matters, MD [facility] [place] Office: [phone] Pager: [phone]

## 2019-06-23 ENCOUNTER — Other Ambulatory Visit: Payer: Self-pay | Admitting: *Deleted

## 2019-06-23 DIAGNOSIS — N186 End stage renal disease: Secondary | ICD-10-CM

## 2019-06-23 DIAGNOSIS — Z992 Dependence on renal dialysis: Secondary | ICD-10-CM

## 2019-06-28 ENCOUNTER — Encounter: Payer: Self-pay | Admitting: Orthopaedic Surgery

## 2019-06-28 ENCOUNTER — Ambulatory Visit (INDEPENDENT_AMBULATORY_CARE_PROVIDER_SITE_OTHER): Payer: Medicare HMO

## 2019-06-28 ENCOUNTER — Ambulatory Visit (INDEPENDENT_AMBULATORY_CARE_PROVIDER_SITE_OTHER): Payer: Medicare HMO | Admitting: Orthopaedic Surgery

## 2019-06-28 ENCOUNTER — Other Ambulatory Visit: Payer: Self-pay

## 2019-06-28 DIAGNOSIS — Z96642 Presence of left artificial hip joint: Secondary | ICD-10-CM

## 2019-06-28 NOTE — Progress Notes (Signed)
Post-Op Visit Note   Patient: Bianca Myers           Date of Birth: Jan 16, 1946           MRN: [id number] Visit Date: 06/28/2019 PCP: [name], MD   Assessment & Plan:  Chief Complaint:  Chief Complaint  Patient presents with  . Left Hip - Routine Post Op   Visit Diagnoses:  1. Status post left hip replacement     Plan: Patient is a pleasant 74 year old female who is currently residing at [facility] who presents to our clinic today 2 weeks out left hip hemiarthroplasty from a femoral neck fracture.  It was also noted that she had a comminuted right superior pubic ramus fracture.  She has been trying to work with therapy at rehab but has had a hard time due to the right hip.  She is ambulating today in a wheelchair due to the pain.  Exam of the left hip reveals a well healing surgical incision with nylon sutures in place.  No evidence of infection or cellulitis.  Right hip exam is somewhat hard to obtain due to the pain.  At this point, she will continue to work with PT.  She will follow up with [place] in 4 weeks time for repeat evaluation and ap pelvis, right lateral hip xrays. Dental prophylaxis reinforced.    Follow-Up Instructions: Return in about 4 weeks (around 07/26/2019).   Orders:  Orders Placed This Encounter  Procedures  . XR HIP UNILAT W OR W/O PELVIS 2-3 VIEWS LEFT   No orders of the defined types were placed in this encounter.   Imaging: No results found.  PMFS History: Patient Active Problem List   Diagnosis Date Noted  . Left displaced femoral neck fracture ([place])   . Syncope 06/07/2019  . MSSA bacteremia 03/10/2019  . Diabetes mellitus ([place]) 03/10/2019  . 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  . Mild aortic stenosis 03/10/2019  . HCAP (healthcare-associated pneumonia) 03/09/2019  . Heart failure ([place]) 08/31/2018  . Anemia of chronic kidney failure, stage 5 ([place]) 08/27/2018  . Chronic depression 08/27/2018  . Patient is Jehovah's Witness  08/27/2018  . Essential hypertension   . End-stage renal disease on hemodialysis ([place]) 12/10/2015   Past Medical History:  Diagnosis Date  . Anemia   . Arthritis   . Chronic kidney disease (CKD) stage G5/A1, glomerular filtration rate (GFR) less than or equal to 15 mL/min/1.73 square meter and albuminuria creatinine ratio less than 30 mg/g (HCC)   . Depression   . Diabetes mellitus ([place])   . Gout   . History of anemia due to chronic kidney disease   . History of degenerative disc disease   . Hypertension   . Pneumonia   . Secondary hyperparathyroidism ([place])   . Spondylisthesis     Family History  Family history unknown: Yes    Past Surgical History:  Procedure Laterality Date  . A/V FISTULAGRAM Left 12/16/2018   Procedure: A/V FISTULAGRAM;  Surgeon: Marty Heck, MD;  Location: [place] [facility];  Service: Cardiovascular;  Laterality: Left;  . A/V FISTULAGRAM Left 06/13/2019   Procedure: A/V FISTULAGRAM;  Surgeon: Marty Heck, MD;  Location: [place] [facility];  Service: Cardiovascular;  Laterality: Left;  . AV FISTULA PLACEMENT Left 08/24/2018   Procedure: ARTERIOVENOUS (AV) FISTULA CREATION;  Surgeon: Waynetta Sandy, MD;  Location: [place];  Service: Vascular;  Laterality: Left;  . BASCILIC VEIN TRANSPOSITION Left 11/04/2018   Procedure:  [place] TRANSPOSITION SECOND STAGE;  Surgeon: Waynetta Sandy, MD;  Location: [place];  Service: Vascular;  Laterality: Left;  . COLONOSCOPY    . HIP SURGERY    . INSERTION OF DIALYSIS CATHETER N/A 06/15/2019   Procedure: INSERTION OF TUNNELED  DIALYSIS CATHETER RIGHT INTERNAL JUGULAR;  Surgeon: Serafina Mitchell, MD;  Location: [place];  Service: Vascular;  Laterality: N/A;  . IR FLUORO GUIDE CV LINE RIGHT  08/21/2018  . IR FLUORO GUIDE CV LINE RIGHT  06/08/2019  . IR REMOVAL TUN CV CATH W/O FL  03/11/2019  . IR US GUIDE VASC ACCESS RIGHT  08/21/2018  . IR US GUIDE VASC ACCESS RIGHT  06/08/2019  . PERIPHERAL  VASCULAR BALLOON ANGIOPLASTY Left 06/13/2019   Procedure: PERIPHERAL VASCULAR BALLOON ANGIOPLASTY;  Surgeon: Marty Heck, MD;  Location: [place] [facility];  Service: Cardiovascular;  Laterality: Left;  AVF  . PERIPHERAL VASCULAR INTERVENTION Left 12/16/2018   Procedure: PERIPHERAL VASCULAR INTERVENTION;  Surgeon: Marty Heck, MD;  Location: [place] [facility];  Service: Cardiovascular;  Laterality: Left;  arm fistula  . TOTAL HIP ARTHROPLASTY Left 06/10/2019   Procedure: TOTAL HIP ARTHROPLASTY ANTERIOR APPROACH;  Surgeon: Leandrew Koyanagi, MD;  Location: [place];  Service: Orthopedics;  Laterality: Left;   Social History   Occupational History  . Not on file  Tobacco Use  . Smoking status: Former Smoker    Quit date: 1973    Years since quitting: 48.3  . Smokeless tobacco: Never Used  Substance and Sexual Activity  . Alcohol use: Not Currently  . Drug use: Never  . Sexual activity: Not on file

## 2019-06-29 ENCOUNTER — Telehealth (HOSPITAL_COMMUNITY): Payer: Self-pay

## 2019-06-29 NOTE — Telephone Encounter (Signed)

## 2019-06-30 ENCOUNTER — Other Ambulatory Visit: Payer: Self-pay

## 2019-06-30 ENCOUNTER — Ambulatory Visit (INDEPENDENT_AMBULATORY_CARE_PROVIDER_SITE_OTHER): Payer: Medicare HMO | Admitting: Vascular Surgery

## 2019-06-30 ENCOUNTER — Ambulatory Visit (INDEPENDENT_AMBULATORY_CARE_PROVIDER_SITE_OTHER)
Admission: RE | Admit: 2019-06-30 | Discharge: 2019-06-30 | Disposition: A | Discharge status: Home / Self Care | Payer: Medicare HMO | Source: Ambulatory Visit | Attending: Vascular Surgery | Admitting: Vascular Surgery

## 2019-06-30 ENCOUNTER — Encounter: Payer: Self-pay | Admitting: Vascular Surgery

## 2019-06-30 ENCOUNTER — Ambulatory Visit (HOSPITAL_COMMUNITY)
Admission: RE | Admit: 2019-06-30 | Discharge: 2019-06-30 | Disposition: A | Discharge status: Home / Self Care | Payer: Medicare HMO | Source: Ambulatory Visit | Attending: Vascular Surgery | Admitting: Vascular Surgery

## 2019-06-30 VITALS — BP 124/77 | HR 62 | Temp 97.5°F | Resp 18 | Ht 67.0 in

## 2019-06-30 DIAGNOSIS — N186 End stage renal disease: Secondary | ICD-10-CM

## 2019-06-30 DIAGNOSIS — Z992 Dependence on renal dialysis: Secondary | ICD-10-CM

## 2019-06-30 NOTE — H&P (View-Only) (Signed)
Referring Physician: Dr Elmarie Shiley  Patient name: Cynai Skeens MRN: [id number] DOB: 03-Mar-1946 Sex: female  REASON FOR CONSULT: Dialysis access  HPI: Shakelia Scrivner is a 74 y.o. female, presents for new hemodialysis access her prior access procedures include a left brachial artery to basilic vein fistula and subsequent second stage procedure.  This was in September 2020.  She had angioplasty and stenting of the outflow basilic vein on October 0350.  He then had additional angioplasty of this June 13 2019.  She then had a right internal jugular vein palindrome catheter placed by Dr. Trula Slade June 15, 2019 after the access had failed.  Other medical problems include diabetes, hypertension, arthritis all of which have been stable.  She recently had a left total hip done by an anterior approach on June 10, 2019.  Past Medical History:  Diagnosis Date  . Anemia   . Arthritis   . Chronic kidney disease (CKD) stage G5/A1, glomerular filtration rate (GFR) less than or equal to 15 mL/min/1.73 square meter and albuminuria creatinine ratio less than 30 mg/g (HCC)   . Depression   . Diabetes mellitus ([place])   . Gout   . History of anemia due to chronic kidney disease   . History of degenerative disc disease   . Hypertension   . Pneumonia   . Secondary hyperparathyroidism ([place])   . Spondylisthesis    Past Surgical History:  Procedure Laterality Date  . A/V FISTULAGRAM Left 12/16/2018   Procedure: A/V FISTULAGRAM;  Surgeon: Marty Heck, MD;  Location: [place] [facility];  Service: Cardiovascular;  Laterality: Left;  . A/V FISTULAGRAM Left 06/13/2019   Procedure: A/V FISTULAGRAM;  Surgeon: Marty Heck, MD;  Location: [place] [facility];  Service: Cardiovascular;  Laterality: Left;  . AV FISTULA PLACEMENT Left 08/24/2018   Procedure: ARTERIOVENOUS (AV) FISTULA CREATION;  Surgeon: Waynetta Sandy, MD;  Location: [place];  Service: Vascular;  Laterality: Left;  .  BASCILIC VEIN TRANSPOSITION Left 11/04/2018   Procedure: BASCILIC VEIN TRANSPOSITION SECOND STAGE;  Surgeon: Waynetta Sandy, MD;  Location: [place];  Service: Vascular;  Laterality: Left;  . COLONOSCOPY    . HIP SURGERY    . INSERTION OF DIALYSIS CATHETER N/A 06/15/2019   Procedure: INSERTION OF TUNNELED  DIALYSIS CATHETER RIGHT INTERNAL JUGULAR;  Surgeon: Serafina Mitchell, MD;  Location: [place];  Service: Vascular;  Laterality: N/A;  . IR FLUORO GUIDE CV LINE RIGHT  08/21/2018  . IR FLUORO GUIDE CV LINE RIGHT  06/08/2019  . IR REMOVAL TUN CV CATH W/O FL  03/11/2019  . IR US GUIDE VASC ACCESS RIGHT  08/21/2018  . IR US GUIDE VASC ACCESS RIGHT  06/08/2019  . PERIPHERAL VASCULAR BALLOON ANGIOPLASTY Left 06/13/2019   Procedure: PERIPHERAL VASCULAR BALLOON ANGIOPLASTY;  Surgeon: Marty Heck, MD;  Location: [place] [facility];  Service: Cardiovascular;  Laterality: Left;  AVF  . PERIPHERAL VASCULAR INTERVENTION Left 12/16/2018   Procedure: PERIPHERAL VASCULAR INTERVENTION;  Surgeon: Marty Heck, MD;  Location: [place] [facility];  Service: Cardiovascular;  Laterality: Left;  arm fistula  . TOTAL HIP ARTHROPLASTY Left 06/10/2019   Procedure: TOTAL HIP ARTHROPLASTY ANTERIOR APPROACH;  Surgeon: Leandrew Koyanagi, MD;  Location: [place];  Service: Orthopedics;  Laterality: Left;    Family History  Family history unknown: Yes    SOCIAL HISTORY: Social History   Socioeconomic History  . Marital status: Widowed    Spouse name: Not on file  .  Number of children: Not on file  . Years of education: Not on file  . Highest education level: Not on file  Occupational History  . Not on file  Tobacco Use  . Smoking status: Former Smoker    Quit date: 1973    Years since quitting: 48.3  . Smokeless tobacco: Never Used  Substance and Sexual Activity  . Alcohol use: Not Currently  . Drug use: Never  . Sexual activity: Not on file  Other Topics Concern  . Not on file  Social History  Narrative  . Not on file   Social Determinants of Health   Financial Resource Strain:   . Difficulty of Paying Living Expenses:   Food Insecurity:   . Worried About Charity fundraiser in the Last Year:   . Arboriculturist in the Last Year:   Transportation Needs:   . Film/video editor (Medical):   Marland Kitchen Lack of Transportation (Non-Medical):   Physical Activity:   . Days of Exercise per Week:   . Minutes of Exercise per Session:   Stress:   . Feeling of Stress :   Social Connections:   . Frequency of Communication with Friends and Family:   . Frequency of Social Gatherings with Friends and Family:   . Attends Religious Services:   . Active Member of Clubs or Organizations:   . Attends Archivist Meetings:   Marland Kitchen Marital Status:   Intimate Partner Violence:   . Fear of Current or Ex-Partner:   . Emotionally Abused:   Marland Kitchen Physically Abused:   . Sexually Abused:     No Known Allergies  Current Outpatient Medications  Medication Sig Dispense Refill  . acetaminophen (TYLENOL) 650 MG CR tablet Take 1,300 mg by mouth every 8 (eight) hours as needed for pain.    Marland Kitchen amLODipine (NORVASC) 10 MG tablet Take 1 tablet (10 mg total) by mouth daily.    . Ascorbic Acid (VITAMIN C) 1000 MG tablet Take 1,000 mg by mouth 2 (two) times a day.     Marland Kitchen atorvastatin (LIPITOR) 10 MG tablet Take 10 mg by mouth at bedtime.    . enoxaparin (LOVENOX) 30 MG/0.3ML injection Inject 0.3 mLs (30 mg total) into the skin daily. 13 mL 0  . escitalopram (LEXAPRO) 10 MG tablet Take 10 mg by mouth daily.    . ferric citrate (AURYXIA) 1 GM 210 MG(Fe) tablet Take 420 mg by mouth 3 (three) times daily with meals.    . furosemide (LASIX) 40 MG tablet Take 1 tablet (40 mg total) by mouth every Tuesday, Thursday, Saturday, and Sunday. (Patient taking differently: Take 80 mg by mouth every Tuesday, Thursday, Saturday, and Sunday. ) 30 tablet 0  . labetalol (NORMODYNE) 200 MG tablet Take 200 mg by mouth 2 (two) times  daily.    Marland Kitchen lidocaine-prilocaine (EMLA) cream Apply 1 application topically as directed. Prior to Dialysis    . LOPERAMIDE HCL PO Take 4 mg by mouth every 4 (four) hours as needed for diarrhea or loose stools.    . multivitamin (RENA-VIT) TABS tablet Take 1 tablet by mouth at bedtime. 30 tablet 0  . Omega-3 Fatty Acids (FISH OIL) 1000 MG CAPS Take 1,000 mg by mouth 2 (two) times daily.     Marland Kitchen oxyCODONE-acetaminophen (PERCOCET) 5-325 MG tablet Take 1-2 tablets by mouth every 8 (eight) hours as needed for severe pain. 30 tablet 0  . polyethylene glycol (MIRALAX) 17 g packet Take 17 g by  mouth daily as needed for moderate constipation. 14 each 0  . QUEtiapine (SEROQUEL) 25 MG tablet Take 0.5 tablets (12.5 mg total) by mouth at bedtime.    . Turmeric 500 MG CAPS Take 500 mg by mouth daily.      No current facility-administered medications for this visit.    ROS:   General:  No weight loss, Fever, chills  HEENT: No recent headaches, no nasal bleeding, no visual changes, no sore throat  Neurologic: No dizziness, blackouts, seizures. No recent symptoms of stroke or mini- stroke. No recent episodes of slurred speech, or temporary blindness.  Cardiac: No recent episodes of chest pain/pressure, no shortness of breath at rest.  No shortness of breath with exertion.  Denies history of atrial fibrillation or irregular heartbeat  Vascular: No history of rest pain in feet.  No history of claudication.  No history of non-healing ulcer, No history of DVT   Pulmonary: No home oxygen, no productive cough, no hemoptysis,  No asthma or wheezing  Musculoskeletal:  [X]  Arthritis, [ ]  Low back pain,  [X]  Joint pain  Hematologic:No history of hypercoagulable state.  No history of easy bleeding.  No history of anemia  Gastrointestinal: No hematochezia or melena,  No gastroesophageal reflux, no trouble swallowing  Urinary: [X]  chronic Kidney disease, [X]  on HD - [X]  MWF or [ ]  TTHS, [ ]  Burning with urination,  [ ]  Frequent urination, [ ]  Difficulty urinating;   Skin: No rashes  Psychological: No history of anxiety,  No history of depression   Physical Examination   Vitals:   06/30/19 0926  BP: 124/77  Pulse: 62  Resp: 18  Temp: (!) 97.5 F (36.4 C)  TempSrc: Temporal  SpO2: 93%  Height: 5\' 7"  (1.702 m)    General:  Alert and oriented, no acute distress HEENT: Normal Neck: No JVD Cardiac: Regular Rate and Rhythm Extremity Pulses:  2+ radial, brachial pulses bilaterally Musculoskeletal: No deformity or edema, multiple scars left upper arm from previous access procedure  Neurologic: Upper and lower extremity motor 5/5 and symmetric  DATA:  Had a vein mapping ultrasound of her left upper extremity today.  This showed poor quality left and right cephalic veins less than 2 mm diameter.  She did have a reasonable basilic vein in the right upper arm right at 3 mm.  Her upper extremity arterial duplex exam was normal.  I reviewed and interpreted both the studies.  I reviewed her recent fistulogram which showed patent central venous structures.  ASSESSMENT: Lengthy discussion today with the patient regarding advantages and disadvantages of AV graft versus AV fistula.  She has a marginal vein in her right upper arm that could potentially be used as a basilic fistula.  She has had a failed basilic fistula in the left arm.  She would prefer to stay in the left arm if possible.  I did discuss with her that all of the access procedures have some durability and function issues and that there are no perfect access options.   PLAN: Left upper arm AV graft scheduled for Jul 12, 2019.  Risk benefits possible complications and procedure details including but not limited to bleeding infection graft thrombosis ischemic steal were discussed with the patient today.  She understands and agrees to proceed.   Ruta Hinds, MD [facility] [place] Office: [phone] Pager:  [phone]

## 2019-06-30 NOTE — Progress Notes (Signed)
Referring Physician: Dr Elmarie Shiley  Patient name: Bianca Myers MRN: [id number] DOB: [birth year] Sex: female  REASON FOR CONSULT: Dialysis access  HPI: Bianca Myers is a 74 y.o. female, presents for new hemodialysis access her prior access procedures include a left brachial artery to basilic vein fistula and subsequent second stage procedure.  This was in September 2020.  She had angioplasty and stenting of the outflow basilic vein on October 4235.  He then had additional angioplasty of this June 13 2019.  She then had a right internal jugular vein palindrome catheter placed by Dr. Trula Slade June 15, 2019 after the access had failed.  Other medical problems include diabetes, hypertension, arthritis all of which have been stable.  She recently had a left total hip done by an anterior approach on June 10, 2019.  Past Medical History:  Diagnosis Date  . Anemia   . Arthritis   . Chronic kidney disease (CKD) stage G5/A1, glomerular filtration rate (GFR) less than or equal to 15 mL/min/1.73 square meter and albuminuria creatinine ratio less than 30 mg/g (HCC)   . Depression   . Diabetes mellitus ([place])   . Gout   . History of anemia due to chronic kidney disease   . History of degenerative disc disease   . Hypertension   . Pneumonia   . Secondary hyperparathyroidism ([place])   . Spondylisthesis    Past Surgical History:  Procedure Laterality Date  . A/V FISTULAGRAM Left 12/16/2018   Procedure: A/V FISTULAGRAM;  Surgeon: Marty Heck, MD;  Location: [facility];  Service: Cardiovascular;  Laterality: Left;  . A/V FISTULAGRAM Left 06/13/2019   Procedure: A/V FISTULAGRAM;  Surgeon: Marty Heck, MD;  Location: [facility];  Service: Cardiovascular;  Laterality: Left;  . AV FISTULA PLACEMENT Left 08/24/2018   Procedure: ARTERIOVENOUS (AV) FISTULA CREATION;  Surgeon: Waynetta Sandy, MD;  Location: [facility];  Service: Vascular;  Laterality: Left;  .  BASCILIC VEIN TRANSPOSITION Left 11/04/2018   Procedure: BASCILIC VEIN TRANSPOSITION SECOND STAGE;  Surgeon: Waynetta Sandy, MD;  Location: [place];  Service: Vascular;  Laterality: Left;  . COLONOSCOPY    . HIP SURGERY    . INSERTION OF DIALYSIS CATHETER N/A 06/15/2019   Procedure: INSERTION OF TUNNELED  DIALYSIS CATHETER RIGHT INTERNAL JUGULAR;  Surgeon: Serafina Mitchell, MD;  Location: [facility];  Service: Vascular;  Laterality: N/A;  . IR FLUORO GUIDE CV LINE RIGHT  08/21/2018  . IR FLUORO GUIDE CV LINE RIGHT  06/08/2019  . IR REMOVAL TUN CV CATH W/O FL  03/11/2019  . IR US GUIDE VASC ACCESS RIGHT  08/21/2018  . IR US GUIDE VASC ACCESS RIGHT  06/08/2019  . PERIPHERAL VASCULAR BALLOON ANGIOPLASTY Left 06/13/2019   Procedure: PERIPHERAL VASCULAR BALLOON ANGIOPLASTY;  Surgeon: Marty Heck, MD;  Location: [facility];  Service: Cardiovascular;  Laterality: Left;  AVF  . PERIPHERAL VASCULAR INTERVENTION Left 12/16/2018   Procedure: PERIPHERAL VASCULAR INTERVENTION;  Surgeon: Marty Heck, MD;  Location: [facility];  Service: Cardiovascular;  Laterality: Left;  arm fistula  . TOTAL HIP ARTHROPLASTY Left 06/10/2019   Procedure: TOTAL HIP ARTHROPLASTY ANTERIOR APPROACH;  Surgeon: Leandrew Koyanagi, MD;  Location: [facility];  Service: Orthopedics;  Laterality: Left;    Family History  Family history unknown: Yes    SOCIAL HISTORY: Social History   Socioeconomic History  . Marital status: Widowed    Spouse name: Not on file  .  Number of children: Not on file  . Years of education: Not on file  . Highest education level: Not on file  Occupational History  . Not on file  Tobacco Use  . Smoking status: Former Smoker    Quit date: 1973    Years since quitting: 48.3  . Smokeless tobacco: Never Used  Substance and Sexual Activity  . Alcohol use: Not Currently  . Drug use: Never  . Sexual activity: Not on file  Other Topics Concern  . Not on file  Social History  Narrative  . Not on file   Social Determinants of Health   Financial Resource Strain:   . Difficulty of Paying Living Expenses:   Food Insecurity:   . Worried About Charity fundraiser in the Last Year:   . Arboriculturist in the Last Year:   Transportation Needs:   . Film/video editor (Medical):   Marland Kitchen Lack of Transportation (Non-Medical):   Physical Activity:   . Days of Exercise per Week:   . Minutes of Exercise per Session:   Stress:   . Feeling of Stress :   Social Connections:   . Frequency of Communication with Friends and Family:   . Frequency of Social Gatherings with Friends and Family:   . Attends Religious Services:   . Active Member of Clubs or Organizations:   . Attends Archivist Meetings:   Marland Kitchen Marital Status:   Intimate Partner Violence:   . Fear of Current or Ex-Partner:   . Emotionally Abused:   Marland Kitchen Physically Abused:   . Sexually Abused:     No Known Allergies  Current Outpatient Medications  Medication Sig Dispense Refill  . acetaminophen (TYLENOL) 650 MG CR tablet Take 1,300 mg by mouth every 8 (eight) hours as needed for pain.    Marland Kitchen amLODipine (NORVASC) 10 MG tablet Take 1 tablet (10 mg total) by mouth daily.    . Ascorbic Acid (VITAMIN C) 1000 MG tablet Take 1,000 mg by mouth 2 (two) times a day.     Marland Kitchen atorvastatin (LIPITOR) 10 MG tablet Take 10 mg by mouth at bedtime.    . enoxaparin (LOVENOX) 30 MG/0.3ML injection Inject 0.3 mLs (30 mg total) into the skin daily. 13 mL 0  . escitalopram (LEXAPRO) 10 MG tablet Take 10 mg by mouth daily.    . ferric citrate (AURYXIA) 1 GM 210 MG(Fe) tablet Take 420 mg by mouth 3 (three) times daily with meals.    . furosemide (LASIX) 40 MG tablet Take 1 tablet (40 mg total) by mouth every Tuesday, Thursday, Saturday, and Sunday. (Patient taking differently: Take 80 mg by mouth every Tuesday, Thursday, Saturday, and Sunday. ) 30 tablet 0  . labetalol (NORMODYNE) 200 MG tablet Take 200 mg by mouth 2 (two) times  daily.    Marland Kitchen lidocaine-prilocaine (EMLA) cream Apply 1 application topically as directed. Prior to Dialysis    . LOPERAMIDE HCL PO Take 4 mg by mouth every 4 (four) hours as needed for diarrhea or loose stools.    . multivitamin (RENA-VIT) TABS tablet Take 1 tablet by mouth at bedtime. 30 tablet 0  . Omega-3 Fatty Acids (FISH OIL) 1000 MG CAPS Take 1,000 mg by mouth 2 (two) times daily.     Marland Kitchen oxyCODONE-acetaminophen (PERCOCET) 5-325 MG tablet Take 1-2 tablets by mouth every 8 (eight) hours as needed for severe pain. 30 tablet 0  . polyethylene glycol (MIRALAX) 17 g packet Take 17 g by  mouth daily as needed for moderate constipation. 14 each 0  . QUEtiapine (SEROQUEL) 25 MG tablet Take 0.5 tablets (12.5 mg total) by mouth at bedtime.    . Turmeric 500 MG CAPS Take 500 mg by mouth daily.      No current facility-administered medications for this visit.    ROS:   General:  No weight loss, Fever, chills  HEENT: No recent headaches, no nasal bleeding, no visual changes, no sore throat  Neurologic: No dizziness, blackouts, seizures. No recent symptoms of stroke or mini- stroke. No recent episodes of slurred speech, or temporary blindness.  Cardiac: No recent episodes of chest pain/pressure, no shortness of breath at rest.  No shortness of breath with exertion.  Denies history of atrial fibrillation or irregular heartbeat  Vascular: No history of rest pain in feet.  No history of claudication.  No history of non-healing ulcer, No history of DVT   Pulmonary: No home oxygen, no productive cough, no hemoptysis,  No asthma or wheezing  Musculoskeletal:  [X]  Arthritis, [ ]  Low back pain,  [X]  Joint pain  Hematologic:No history of hypercoagulable state.  No history of easy bleeding.  No history of anemia  Gastrointestinal: No hematochezia or melena,  No gastroesophageal reflux, no trouble swallowing  Urinary: [X]  chronic Kidney disease, [X]  on HD - [X]  MWF or [ ]  TTHS, [ ]  Burning with urination,  [ ]  Frequent urination, [ ]  Difficulty urinating;   Skin: No rashes  Psychological: No history of anxiety,  No history of depression   Physical Examination   Vitals:   06/30/19 0926  BP: 124/77  Pulse: 62  Resp: 18  Temp: (!) 97.5 F (36.4 C)  TempSrc: Temporal  SpO2: 93%  Height: 5\' 7"  (1.702 m)    General:  Alert and oriented, no acute distress HEENT: Normal Neck: No JVD Cardiac: Regular Rate and Rhythm Extremity Pulses:  2+ radial, brachial pulses bilaterally Musculoskeletal: No deformity or edema, multiple scars left upper arm from previous access procedure  Neurologic: Upper and lower extremity motor 5/5 and symmetric  DATA:  Had a vein mapping ultrasound of her left upper extremity today.  This showed poor quality left and right cephalic veins less than 2 mm diameter.  She did have a reasonable basilic vein in the right upper arm right at 3 mm.  Her upper extremity arterial duplex exam was normal.  I reviewed and interpreted both the studies.  I reviewed her recent fistulogram which showed patent central venous structures.  ASSESSMENT: Lengthy discussion today with the patient regarding advantages and disadvantages of AV graft versus AV fistula.  She has a marginal vein in her right upper arm that could potentially be used as a basilic fistula.  She has had a failed basilic fistula in the left arm.  She would prefer to stay in the left arm if possible.  I did discuss with her that all of the access procedures have some durability and function issues and that there are no perfect access options.   PLAN: Left upper arm AV graft scheduled for Jul 12, 2019.  Risk benefits possible complications and procedure details including but not limited to bleeding infection graft thrombosis ischemic steal were discussed with the patient today.  She understands and agrees to proceed.   Ruta Hinds, MD [facility] Office: [phone] Pager:  [phone]

## 2019-07-01 ENCOUNTER — Inpatient Hospital Stay: Payer: Medicare HMO | Admitting: Orthopaedic Surgery

## 2019-07-08 ENCOUNTER — Other Ambulatory Visit (HOSPITAL_COMMUNITY): Payer: Medicare HMO

## 2019-07-11 ENCOUNTER — Encounter (HOSPITAL_COMMUNITY): Payer: Self-pay | Admitting: Vascular Surgery

## 2019-07-11 ENCOUNTER — Other Ambulatory Visit (HOSPITAL_COMMUNITY): Payer: Medicare HMO

## 2019-07-11 NOTE — Progress Notes (Signed)
Spoke with Glenard Haring, LPN for pre-op call. Pt is a resident of [facility]. She states pt does not have a cardiac history. Pt is a type 2 Diabetic. Last A1C was 6.1 on 03/09/19. Glenard Haring states they do not check pt's blood sugar because it diabetes is under control. She states pt is alert, oriented and can speak for herself. She states pt has not had any recent complaints of chest pain or sob.  Faxed the pre-op instructions to [facility] at [phone]. 1850: Have tried numerous times to fax these instructions. Line is busy. I have spoken to Joellen Jersey, Network engineer and she states she has a large project being faxed out, but thought she could still receive faxes. She states it should be done soon and asked that I try again in about 20 minutes.   Finally able to fax it after numerous attempts

## 2019-07-11 NOTE — Pre-Procedure Instructions (Signed)
   Efrata Brunner  07/11/2019    Ms.Neale's procedure is scheduled on Tuesday, 07/12/19 at 9:25 AM.   Report to [facility] Entrance "A" Admitting Office at 6:30 AM.   Call this number if you have problems the morning of surgery: [phone]   Remember:  Patient is not to eat or drink after midnight tonight.  Take these medicines the morning of surgery with A SIP OF WATER: Amlodipine (Norvasc), Escitalopram (Lexapro), Labetalol (Normodyne), Oxycodone or Acetaminophen - prn.  Please hold patient's Melatonin, Multivitamins and Fish Oil (Omega 3) tonight.   Per instructions Dr. Irene Limbo, Anesthesiologist/Mehlani Blankenburg Stan Head, RN    Do not wear jewelry, make-up or nail polish.  Do not wear lotions, powders, perfumes or deodorant.  Do not shave 48 hours prior to surgery.    Do not bring valuables to the hospital.  [facility] is not responsible for any belongings or valuables.  Contacts, dentures or bridgework may not be worn into surgery.  Leave your suitcase in the car.  After surgery it may be brought to your room.  For patients admitted to the hospital, discharge time will be determined by your treatment team.  Patients discharged the day of surgery will not be allowed to drive home.   Any questions this evening, call Lilia Pro, RN at [phone].

## 2019-07-12 ENCOUNTER — Encounter (HOSPITAL_COMMUNITY): Payer: Self-pay | Admitting: Vascular Surgery

## 2019-07-12 ENCOUNTER — Other Ambulatory Visit: Payer: Self-pay

## 2019-07-12 ENCOUNTER — Encounter (HOSPITAL_COMMUNITY)
Admission: RE | Disposition: A | Discharge status: Rehabilitation Facility | Payer: Self-pay | Source: Home / Self Care | Attending: Vascular Surgery

## 2019-07-12 ENCOUNTER — Ambulatory Visit (HOSPITAL_COMMUNITY)
Admission: RE | Admit: 2019-07-12 | Discharge: 2019-07-12 | Disposition: A | Discharge status: Rehabilitation Facility | Payer: Medicare HMO | Attending: Vascular Surgery | Admitting: Vascular Surgery

## 2019-07-12 ENCOUNTER — Ambulatory Visit (HOSPITAL_COMMUNITY): Payer: Medicare HMO | Admitting: Certified Registered"

## 2019-07-12 DIAGNOSIS — X58XXXA Exposure to other specified factors, initial encounter: Secondary | ICD-10-CM | POA: Insufficient documentation

## 2019-07-12 DIAGNOSIS — Z96642 Presence of left artificial hip joint: Secondary | ICD-10-CM | POA: Insufficient documentation

## 2019-07-12 DIAGNOSIS — N2581 Secondary hyperparathyroidism of renal origin: Secondary | ICD-10-CM | POA: Insufficient documentation

## 2019-07-12 DIAGNOSIS — M4310 Spondylolisthesis, site unspecified: Secondary | ICD-10-CM | POA: Insufficient documentation

## 2019-07-12 DIAGNOSIS — Z79899 Other long term (current) drug therapy: Secondary | ICD-10-CM | POA: Insufficient documentation

## 2019-07-12 DIAGNOSIS — Y9389 Activity, other specified: Secondary | ICD-10-CM | POA: Insufficient documentation

## 2019-07-12 DIAGNOSIS — I120 Hypertensive chronic kidney disease with stage 5 chronic kidney disease or end stage renal disease: Secondary | ICD-10-CM | POA: Insufficient documentation

## 2019-07-12 DIAGNOSIS — Z20822 Contact with and (suspected) exposure to covid-19: Secondary | ICD-10-CM | POA: Diagnosis not present

## 2019-07-12 DIAGNOSIS — Z7901 Long term (current) use of anticoagulants: Secondary | ICD-10-CM | POA: Insufficient documentation

## 2019-07-12 DIAGNOSIS — N186 End stage renal disease: Secondary | ICD-10-CM | POA: Insufficient documentation

## 2019-07-12 DIAGNOSIS — Z87891 Personal history of nicotine dependence: Secondary | ICD-10-CM | POA: Insufficient documentation

## 2019-07-12 DIAGNOSIS — Z992 Dependence on renal dialysis: Secondary | ICD-10-CM

## 2019-07-12 DIAGNOSIS — M109 Gout, unspecified: Secondary | ICD-10-CM | POA: Diagnosis not present

## 2019-07-12 DIAGNOSIS — T829XXA Unspecified complication of cardiac and vascular prosthetic device, implant and graft, initial encounter: Secondary | ICD-10-CM | POA: Diagnosis present

## 2019-07-12 DIAGNOSIS — F329 Major depressive disorder, single episode, unspecified: Secondary | ICD-10-CM | POA: Diagnosis not present

## 2019-07-12 DIAGNOSIS — E1122 Type 2 diabetes mellitus with diabetic chronic kidney disease: Secondary | ICD-10-CM | POA: Diagnosis not present

## 2019-07-12 DIAGNOSIS — D631 Anemia in chronic kidney disease: Secondary | ICD-10-CM | POA: Diagnosis not present

## 2019-07-12 DIAGNOSIS — T82898A Other specified complication of vascular prosthetic devices, implants and grafts, initial encounter: Secondary | ICD-10-CM

## 2019-07-12 DIAGNOSIS — M1990 Unspecified osteoarthritis, unspecified site: Secondary | ICD-10-CM | POA: Insufficient documentation

## 2019-07-12 HISTORY — PX: AV FISTULA PLACEMENT: SHX1204

## 2019-07-12 LAB — POCT I-STAT, CHEM 8
BUN: 23 mg/dL (ref 8–23)
Calcium, Ion: 1.06 mmol/L — ABNORMAL LOW (ref 1.15–1.40)
Chloride: 94 mmol/L — ABNORMAL LOW (ref 98–111)
Creatinine, Ser: 5.8 mg/dL — ABNORMAL HIGH (ref 0.44–1.00)
Glucose, Bld: 86 mg/dL (ref 70–99)
HCT: 42 % (ref 36.0–46.0)
Hemoglobin: 14.3 g/dL (ref 12.0–15.0)
Potassium: 4.3 mmol/L (ref 3.5–5.1)
Sodium: 135 mmol/L (ref 135–145)
TCO2: 32 mmol/L (ref 22–32)

## 2019-07-12 LAB — GLUCOSE, CAPILLARY
Glucose-Capillary: 88 mg/dL (ref 70–99)
Glucose-Capillary: 70 mg/dL (ref 70–99)

## 2019-07-12 LAB — SARS CORONAVIRUS 2 BY RT PCR (HOSPITAL ORDER, PERFORMED IN ~~LOC~~ HOSPITAL LAB): SARS Coronavirus 2: NEGATIVE

## 2019-07-12 SURGERY — INSERTION OF ARTERIOVENOUS (AV) GORE-TEX GRAFT ARM
Anesthesia: Monitor Anesthesia Care | Site: Arm Upper | Laterality: Left

## 2019-07-12 MED ORDER — DIPHENHYDRAMINE HCL 50 MG/ML IJ SOLN
INTRAMUSCULAR | Status: DC | PRN
Start: 2019-07-12 — End: 2019-07-12
  Administered 2019-07-12: 12.5 mg via INTRAVENOUS

## 2019-07-12 MED ORDER — FENTANYL CITRATE (PF) 100 MCG/2ML IJ SOLN: 25.0000 ug | INTRAMUSCULAR | Status: DC | PRN

## 2019-07-12 MED ORDER — PROTAMINE SULFATE 10 MG/ML IV SOLN
INTRAVENOUS | Status: DC | PRN
Start: 2019-07-12 — End: 2019-07-12
  Administered 2019-07-12: 20 mg via INTRAVENOUS
  Administered 2019-07-12: 5 mg via INTRAVENOUS
  Administered 2019-07-12: 25 mg via INTRAVENOUS
  Administered 2019-07-12: 30 mg via INTRAVENOUS

## 2019-07-12 MED ORDER — HEPARIN SODIUM (PORCINE) 1000 UNIT/ML IJ SOLN
INTRAMUSCULAR | Status: AC
  Filled 2019-07-12: qty 1

## 2019-07-12 MED ORDER — MEPERIDINE HCL 25 MG/ML IJ SOLN: 6.2500 mg | INTRAMUSCULAR | Status: DC | PRN

## 2019-07-12 MED ORDER — OXYCODONE HCL 5 MG PO TABS: 5.0000 mg | ORAL_TABLET | Freq: Once | ORAL | Status: DC | PRN

## 2019-07-12 MED ORDER — SODIUM CHLORIDE 0.9 % IV SOLN: INTRAVENOUS | Status: DC | PRN

## 2019-07-12 MED ORDER — OXYCODONE HCL 10 MG PO TABS: 10.0000 mg | ORAL_TABLET | ORAL | 0 refills | Status: DC | PRN

## 2019-07-12 MED ORDER — HEPARIN SODIUM (PORCINE) 1000 UNIT/ML IJ SOLN
INTRAMUSCULAR | Status: DC | PRN
Start: 2019-07-12 — End: 2019-07-12
  Administered 2019-07-12: 8000 [IU] via INTRAVENOUS

## 2019-07-12 MED ORDER — PHENYLEPHRINE 40 MCG/ML (10ML) SYRINGE FOR IV PUSH (FOR BLOOD PRESSURE SUPPORT)
PREFILLED_SYRINGE | INTRAVENOUS | Status: DC | PRN
  Administered 2019-07-12 (×2): 80 ug via INTRAVENOUS

## 2019-07-12 MED ORDER — LIDOCAINE 2% (20 MG/ML) 5 ML SYRINGE
INTRAMUSCULAR | Status: DC | PRN
  Administered 2019-07-12: 40 mg via INTRAVENOUS

## 2019-07-12 MED ORDER — LIDOCAINE HCL (PF) 1 % IJ SOLN
INTRAMUSCULAR | Status: AC
  Filled 2019-07-12: qty 30

## 2019-07-12 MED ORDER — HEMOSTATIC AGENTS (NO CHARGE) OPTIME
TOPICAL | Status: DC | PRN
  Administered 2019-07-12: 1 via TOPICAL

## 2019-07-12 MED ORDER — PROTAMINE SULFATE 10 MG/ML IV SOLN
INTRAVENOUS | Status: AC
  Filled 2019-07-12: qty 5

## 2019-07-12 MED ORDER — FENTANYL CITRATE (PF) 250 MCG/5ML IJ SOLN
INTRAMUSCULAR | Status: AC
  Filled 2019-07-12: qty 5

## 2019-07-12 MED ORDER — ONDANSETRON HCL 4 MG/2ML IJ SOLN: 4.0000 mg | Freq: Once | INTRAMUSCULAR | Status: DC | PRN

## 2019-07-12 MED ORDER — SODIUM CHLORIDE 0.9 % IV SOLN
INTRAVENOUS | Status: DC | PRN
  Administered 2019-07-12: 500 mL

## 2019-07-12 MED ORDER — ONDANSETRON HCL 4 MG/2ML IJ SOLN: 4.0000 mg | Freq: Four times a day (QID) | INTRAMUSCULAR | Status: DC | PRN

## 2019-07-12 MED ORDER — CEFAZOLIN SODIUM-DEXTROSE 2-4 GM/100ML-% IV SOLN
2.0000 g | INTRAVENOUS | Status: AC
  Administered 2019-07-12: 10:00:00 2 g via INTRAVENOUS
  Filled 2019-07-12: qty 100

## 2019-07-12 MED ORDER — ONDANSETRON HCL 4 MG/2ML IJ SOLN
INTRAMUSCULAR | Status: DC | PRN
  Administered 2019-07-12: 4 mg via INTRAVENOUS

## 2019-07-12 MED ORDER — FENTANYL CITRATE (PF) 100 MCG/2ML IJ SOLN
INTRAMUSCULAR | Status: DC | PRN
  Administered 2019-07-12 (×4): 25 ug via INTRAVENOUS

## 2019-07-12 MED ORDER — SODIUM CHLORIDE 0.9 % IV SOLN
INTRAVENOUS | Status: AC
  Filled 2019-07-12: qty 1.2

## 2019-07-12 MED ORDER — PHENYLEPHRINE HCL-NACL 10-0.9 MG/250ML-% IV SOLN
INTRAVENOUS | Status: DC | PRN
  Administered 2019-07-12: 20 ug/min via INTRAVENOUS

## 2019-07-12 MED ORDER — SODIUM CHLORIDE 0.9 % IV SOLN: INTRAVENOUS | Status: DC

## 2019-07-12 MED ORDER — HYDROMORPHONE HCL 1 MG/ML IJ SOLN: 0.2500 mg | INTRAMUSCULAR | Status: DC | PRN

## 2019-07-12 MED ORDER — PROPOFOL 500 MG/50ML IV EMUL
INTRAVENOUS | Status: DC | PRN
  Administered 2019-07-12: 50 ug/kg/min via INTRAVENOUS

## 2019-07-12 MED ORDER — PROPOFOL 10 MG/ML IV BOLUS
INTRAVENOUS | Status: DC | PRN
  Administered 2019-07-12 (×5): 20 mg via INTRAVENOUS

## 2019-07-12 MED ORDER — 0.9 % SODIUM CHLORIDE (POUR BTL) OPTIME
TOPICAL | Status: DC | PRN
  Administered 2019-07-12: 11:00:00 1000 mL

## 2019-07-12 MED ORDER — CHLORHEXIDINE GLUCONATE 4 % EX LIQD: 60.0000 mL | Freq: Once | CUTANEOUS | Status: DC

## 2019-07-12 MED ORDER — DIPHENHYDRAMINE HCL 50 MG/ML IJ SOLN
INTRAMUSCULAR | Status: AC
  Filled 2019-07-12: qty 1

## 2019-07-12 MED ORDER — LIDOCAINE 2% (20 MG/ML) 5 ML SYRINGE
INTRAMUSCULAR | Status: AC
  Filled 2019-07-12: qty 5

## 2019-07-12 MED ORDER — PROPOFOL 10 MG/ML IV BOLUS
INTRAVENOUS | Status: AC
  Filled 2019-07-12: qty 20

## 2019-07-12 MED ORDER — OXYCODONE HCL 5 MG/5ML PO SOLN: 5.0000 mg | Freq: Once | ORAL | Status: DC | PRN

## 2019-07-12 MED ORDER — LIDOCAINE HCL (PF) 1 % IJ SOLN
INTRAMUSCULAR | Status: DC | PRN
  Administered 2019-07-12: 30 mL
  Administered 2019-07-12: 28 mL
  Administered 2019-07-12: 5 mL

## 2019-07-12 MED ORDER — ONDANSETRON HCL 4 MG/2ML IJ SOLN
INTRAMUSCULAR | Status: AC
  Filled 2019-07-12: qty 2

## 2019-07-12 SURGICAL SUPPLY — 37 items
ARMBAND PINK RESTRICT EXTREMIT (MISCELLANEOUS) ×2 IMPLANT
CANISTER SUCT 3000ML PPV (MISCELLANEOUS) ×2 IMPLANT
CANNULA VESSEL 3MM 2 BLNT TIP (CANNULA) ×2 IMPLANT
CLIP VESOCCLUDE MED 6/CT (CLIP) ×2 IMPLANT
CLIP VESOCCLUDE SM WIDE 6/CT (CLIP) ×2 IMPLANT
COVER WAND RF STERILE (DRAPES) IMPLANT
DECANTER SPIKE VIAL GLASS SM (MISCELLANEOUS) IMPLANT
DERMABOND ADVANCED (GAUZE/BANDAGES/DRESSINGS) ×1
DERMABOND ADVANCED .7 DNX12 (GAUZE/BANDAGES/DRESSINGS) ×1 IMPLANT
ELECT REM PT RETURN 9FT ADLT (ELECTROSURGICAL) ×2
ELECTRODE REM PT RTRN 9FT ADLT (ELECTROSURGICAL) ×1 IMPLANT
GAUZE SPONGE 4X4 16PLY XRAY LF (GAUZE/BANDAGES/DRESSINGS) ×4 IMPLANT
GLOVE BIO SURGEON STRL SZ7.5 (GLOVE) ×2 IMPLANT
GLOVE BIOGEL PI IND STRL 6.5 (GLOVE) ×3 IMPLANT
GLOVE BIOGEL PI IND STRL 7.5 (GLOVE) ×1 IMPLANT
GLOVE BIOGEL PI INDICATOR 6.5 (GLOVE) ×3
GLOVE BIOGEL PI INDICATOR 7.5 (GLOVE) ×1
GLOVE SURG SS PI 6.5 STRL IVOR (GLOVE) ×4 IMPLANT
GOWN STRL REUS W/ TWL LRG LVL3 (GOWN DISPOSABLE) ×3 IMPLANT
GOWN STRL REUS W/TWL LRG LVL3 (GOWN DISPOSABLE) ×3
GRAFT GORETEX STRT 4-7X45 (Vascular Products) ×2 IMPLANT
HEMOSTAT HEMOBLAST BELLOWS (HEMOSTASIS) ×2 IMPLANT
HEMOSTAT SPONGE AVITENE ULTRA (HEMOSTASIS) IMPLANT
KIT BASIN OR (CUSTOM PROCEDURE TRAY) ×2 IMPLANT
KIT TURNOVER KIT B (KITS) ×2 IMPLANT
NS IRRIG 1000ML POUR BTL (IV SOLUTION) ×2 IMPLANT
PACK CV ACCESS (CUSTOM PROCEDURE TRAY) ×2 IMPLANT
PAD ARMBOARD 7.5X6 YLW CONV (MISCELLANEOUS) ×4 IMPLANT
SUT PROLENE 5 0 C 1 24 (SUTURE) ×2 IMPLANT
SUT PROLENE 6 0 CC (SUTURE) ×8 IMPLANT
SUT VIC AB 3-0 SH 27 (SUTURE) ×3
SUT VIC AB 3-0 SH 27X BRD (SUTURE) ×3 IMPLANT
SUT VICRYL 4-0 PS2 18IN ABS (SUTURE) ×6 IMPLANT
SYR TOOMEY 50ML (SYRINGE) IMPLANT
TOWEL GREEN STERILE (TOWEL DISPOSABLE) ×2 IMPLANT
UNDERPAD 30X36 HEAVY ABSORB (UNDERPADS AND DIAPERS) ×2 IMPLANT
WATER STERILE IRR 1000ML POUR (IV SOLUTION) ×2 IMPLANT

## 2019-07-12 NOTE — Anesthesia Preprocedure Evaluation (Addendum)
Anesthesia Evaluation  Patient identified by MRN, date of birth, ID band Patient awake    Reviewed: Allergy & Precautions, H&P , NPO status , Patient's Chart, lab work & pertinent test results  Airway Mallampati: II   Neck ROM: full    Dental  (+) Dental Advisory Given   Pulmonary former smoker,    breath sounds clear to auscultation       Cardiovascular hypertension,  Rhythm:regular Rate:Normal     Neuro/Psych PSYCHIATRIC DISORDERS Depression    GI/Hepatic   Endo/Other  diabetes, Type 2  Renal/GU ESRFRenal disease     Musculoskeletal  (+) Arthritis ,   Abdominal   Peds  Hematology   Anesthesia Other Findings   Reproductive/Obstetrics                            Anesthesia Physical Anesthesia Plan  ASA: III  Anesthesia Plan: MAC   Post-op Pain Management:    Induction: Intravenous  PONV Risk Score and Plan: 2 and Propofol infusion and Treatment may vary due to age or medical condition  Airway Management Planned: Simple Face Mask  Additional Equipment:   Intra-op Plan:   Post-operative Plan:   Informed Consent: I have reviewed the patients History and Physical, chart, labs and discussed the procedure including the risks, benefits and alternatives for the proposed anesthesia with the patient or authorized representative who has indicated his/her understanding and acceptance.     Dental advisory given  Plan Discussed with: CRNA  Anesthesia Plan Comments:        Anesthesia Quick Evaluation

## 2019-07-12 NOTE — Interval H&P Note (Signed)
History and Physical Interval Note:  07/12/2019 8:40 AM  Bianca Myers  has presented today for surgery, with the diagnosis of END STAGE RENAL DISEASE.  The various methods of treatment have been discussed with the patient and family. After consideration of risks, benefits and other options for treatment, the patient has consented to  Procedure(s): INSERTION OF LEFT UPPER ARM  ARTERIOVENOUS (AV) GORE-TEX GRAFT ARM (Left) as a surgical intervention.  The patient's history has been reviewed, patient examined, no change in status, stable for surgery.  I have reviewed the patient's chart and labs.  Questions were answered to the patient's satisfaction.     Ruta Hinds

## 2019-07-12 NOTE — Discharge Summary (Signed)
[facility] Discharge Summary   Patient ID:  Bianca Myers MRN: [id number] DOB/AGE: 1946-01-21 74 y.o.  Admit date: 07/12/2019 Discharge date: 07/12/2019 Date of Surgery: 07/12/2019 Surgeon: Surgeon(s): Oneida Alar Jessy Oto, MD  Admission Diagnosis: END STAGE RENAL DISEASE  Discharge Diagnoses:  END STAGE RENAL DISEASE  Secondary Diagnoses: Past Medical History:  Diagnosis Date  . Anemia   . Arthritis   . Chronic kidney disease (CKD) stage G5/A1, glomerular filtration rate (GFR) less than or equal to 15 mL/min/1.73 square meter and albuminuria creatinine ratio less than 30 mg/g (HCC)    Dialysis M/W/F  . Depression   . Diabetes mellitus ([place])   . Gout   . History of anemia due to chronic kidney disease   . History of degenerative disc disease   . Hypertension   . Pneumonia   . Secondary hyperparathyroidism ([place])   . Spondylisthesis     Procedure(s): INSERTION OF LEFT UPPER ARM  ARTERIOVENOUS (AV) GORE-TEX GRAFT  Discharged Condition: good  HPI: 74 y/o female presented to [facility] for new dialysis access.   Prior access procedures include a left brachial artery to basilic vein fistula and subsequent second stage procedure.  This was in September 2020.  She had angioplasty and stenting of the outflow basilic vein on October 3614.  He then had additional angioplasty of this June 13 2019.  She then had a right internal jugular vein palindrome catheter placed by Dr. Trula Slade June 15, 2019 after the access had failed.  Other medical problems include diabetes, hypertension, arthritis all of which have been stable.  She recently had a left total hip done by an anterior approach on June 10, 2019.   Hospital Course:  Bianca Myers is a 74 y.o. female is S/P Left Procedure(s): INSERTION OF LEFT UPPER ARM  ARTERIOVENOUS (AV) GORE-TEX GRAFT  She did well with local anesthetic at the incision sites.  The fistula has a palpable thrill and the incisions are  healing well.  She has a [facility] for HD access. The graft may not be used for 4 weeks in the left UE.  Stable for discharge back to her SNF.     Significant Diagnostic Studies: CBC Lab Results  Component Value Date   WBC 9.8 06/15/2019   HGB 14.3 07/12/2019   HCT 42.0 07/12/2019   MCV 97.4 06/15/2019   PLT 236 06/15/2019    BMET    Component Value Date/Time   NA 135 07/12/2019 0807   K 4.3 07/12/2019 0807   CL 94 (L) 07/12/2019 0807   CO2 23 06/15/2019 0404   GLUCOSE 86 07/12/2019 0807   BUN 23 07/12/2019 0807   CREATININE 5.80 (H) 07/12/2019 0807   CALCIUM 8.5 (L) 06/15/2019 0404   GFRNONAA 7 (L) 06/15/2019 0404   GFRAA 8 (L) 06/15/2019 0404   COAG Lab Results  Component Value Date   INR 1.3 (H) 06/08/2019   INR 1.2 08/24/2018   INR 1.1 08/21/2018     Disposition:  Discharge to :Skilled nursing facility Discharge Instructions    Call MD for:  redness, tenderness, or signs of infection (pain, swelling, bleeding, redness, odor or green/yellow discharge around incision site)   Complete by: As directed    Call MD for:  redness, tenderness, or signs of infection (pain, swelling, bleeding, redness, odor or green/yellow discharge around incision site)   Complete by: As directed    Call MD for:  severe or increased pain, loss or decreased feeling  in affected limb(s)  Complete by: As directed    Call MD for:  severe or increased pain, loss or decreased feeling  in affected limb(s)   Complete by: As directed    Call MD for:  temperature >100.5   Complete by: As directed    Call MD for:  temperature >100.5   Complete by: As directed    Resume previous diet   Complete by: As directed    Resume previous diet   Complete by: As directed      Allergies as of 07/12/2019   No Known Allergies     Medication List    STOP taking these medications   oxyCODONE-acetaminophen 5-325 MG tablet Commonly known as: Percocet   QUEtiapine 25 MG tablet Commonly known as: SEROQUEL      TAKE these medications   acetaminophen 650 MG CR tablet Commonly known as: TYLENOL Take 1,300 mg by mouth every 8 (eight) hours as needed for pain.   amLODipine 10 MG tablet Commonly known as: NORVASC Take 1 tablet (10 mg total) by mouth daily.   atorvastatin 10 MG tablet Commonly known as: LIPITOR Take 10 mg by mouth at bedtime.   Auryxia 1 GM 210 MG(Fe) tablet Generic drug: ferric citrate Take 420 mg by mouth 3 (three) times daily with meals.   Cholecalciferol 100 MCG (4000 UT) Caps Take 4,000 Units by mouth daily.   enoxaparin 30 MG/0.3ML injection Commonly known as: LOVENOX Inject 0.3 mLs (30 mg total) into the skin daily.   escitalopram 10 MG tablet Commonly known as: LEXAPRO Take 10 mg by mouth daily.   Fish Oil 1000 MG Caps Take 1,000 mg by mouth 2 (two) times daily.   furosemide 40 MG tablet Commonly known as: LASIX Take 1 tablet (40 mg total) by mouth every Tuesday, Thursday, Saturday, and Sunday.   labetalol 200 MG tablet Commonly known as: NORMODYNE Take 200 mg by mouth 2 (two) times daily.   lidocaine-prilocaine cream Commonly known as: EMLA Apply 1 application topically as directed. Prior to Dialysis   loperamide 2 MG capsule Commonly known as: IMODIUM Take 4 mg by mouth every 4 (four) hours as needed for diarrhea or loose stools.   melatonin 3 MG Tabs tablet Take 6 mg by mouth at bedtime.   multivitamin Tabs tablet Take 1 tablet by mouth at bedtime.   Oxycodone HCl 10 MG Tabs Take 1 tablet (10 mg total) by mouth every 4 (four) hours as needed (pain).   polyethylene glycol 17 g packet Commonly known as: MiraLax Take 17 g by mouth daily as needed for moderate constipation. What changed: when to take this   senna 8.6 MG Tabs tablet Commonly known as: SENOKOT Take 2 tablets by mouth at bedtime.   Turmeric 500 MG Caps Take 500 mg by mouth daily.   vitamin C 1000 MG tablet Take 1,000 mg by mouth 2 (two) times a day.      Verbal and  written Discharge instructions given to the patient. Wound care per Discharge AVS Follow-up Information    Elam Dutch, MD In 2 weeks.   Specialties: Vascular Surgery, Cardiology Why: Office will call you to arrange your appt (sent) Contact information: [address] [place] [address] [phone]           Signed: Roxy Horseman 07/12/2019, 1:10 PM

## 2019-07-12 NOTE — Anesthesia Postprocedure Evaluation (Signed)
Anesthesia Post Note  Patient: Bianca Myers  Procedure(s) Performed: INSERTION OF LEFT UPPER ARM  ARTERIOVENOUS (AV) GORE-TEX GRAFT (Left Arm Upper)     Patient location during evaluation: PACU Anesthesia Type: MAC Level of consciousness: awake and alert Pain management: pain level controlled Vital Signs Assessment: post-procedure vital signs reviewed and stable Respiratory status: spontaneous breathing, nonlabored ventilation, respiratory function stable and patient connected to nasal cannula oxygen Cardiovascular status: stable and blood pressure returned to baseline Postop Assessment: no apparent nausea or vomiting Anesthetic complications: no    Last Vitals:  Vitals:   07/12/19 1305 07/12/19 1320  BP: (!) 145/71 109/71  Pulse: 68 70  Resp: 20 18  Temp:  36.6 C  SpO2: 100% 100%    Last Pain:  Vitals:   07/12/19 1320  PainSc: 0-No pain                 Tramayne Sebesta S

## 2019-07-12 NOTE — Transfer of Care (Signed)
Immediate Anesthesia Transfer of Care Note  Patient: Bianca Myers  Procedure(s) Performed: INSERTION OF LEFT UPPER ARM  ARTERIOVENOUS (AV) GORE-TEX GRAFT (Left Arm Upper)  Patient Location: PACU  Anesthesia Type:MAC  Level of Consciousness: awake and patient cooperative  Airway & Oxygen Therapy: Patient Spontanous Breathing and Patient connected to face mask oxygen  Post-op Assessment: Report given to RN and Post -op Vital signs reviewed and stable  Post vital signs: Reviewed and stable   Last Vitals:  Vitals Value Taken Time  BP    Temp    Pulse    Resp    SpO2      Last Pain:  Vitals:   07/12/19 0745  PainSc: 7       Patients Stated Pain Goal: 3 (41/03/01 3143)  Complications: No apparent anesthesia complications

## 2019-07-12 NOTE — Op Note (Signed)
Procedure: Ligation of left upper arm AV fistula, placement of left upper arm AV graft  Preoperative diagnosis: End-stage renal disease  Postoperative diagnosis: Same  Anesthesia: Local with IV sedation  Assistant: Gerri Lins, PA-C  Operative findings: #1 4 to 7 mm tapered PTFE and and 2 axillary vein  Indications: Patient is a 74 year old female who presented with a poorly functioning left upper arm AV fistula that has had multiple percutaneous interventions.  Decision was made to place a left upper arm graft since the fistula was not working appropriately.  Operative details: After pain informed consent, the patient was taken the operating.  The patient was placed in supine just operating table.  After adequate sedation patient's entire left upper extremities prepped and draped in usual sterile fashion.  Longitudinal incision was made through pre-existing scar in the left axilla.  Incision was carried down through the subcutaneous tissues down the level of the left axillary vein.  The axillary vein had a palpable stent within it.  I dissected out the vein circumferentially high up into the axilla to get past the area of the stent.  The vein was of good quality in this location about 5 mm in diameter.  It was dissected free circumferentially and small side branches ligated and divided between silk ties.  Next the brachial artery was exposed by infiltrating local anesthesia near the antecubital area and carrying subcutaneous skin incision down to the level of brachial artery.  The brachial artery was about 3-1/2 mm in diameter.  It was dissected free circumferentially and Vesseloops placed around it.  Next a subcutaneous tunnel was created connecting the antecubital incision to the axillary incision.  Patient was then given 8000 units of heparin after placing the graft through the subcutaneous tunnel.  This was a 4-7 mm tapered PTFE graft.  Vesseloops were used to control the artery proximally  distally.  Longitudinal opening was made in the brachial artery the 4 mm into the graft was beveled slightly and sewn endograft side of artery using a running 6-0 Prolene suture.  After completion of the anastomosis, it was fore blood backbled and thoroughly flushed the anastomosis was secured clamps released and there was pulsatile flow in the graft immediately.  Attention was then turned to the upper arm.  The area of previously stented axillary vein was transected.  The proximal aspect of this was oversewn with a running 5-0 Prolene suture.  I then debrided away the remainder of the stent and got to a fresh area of axillary vein.  The graft was then cut to length and sewn end-to-end to the axillary vein using a running 6-0 Prolene suture.  After completion of the anastomosis it was fore blood backbled and thoroughly flushed.  The proximal clamp was released there is palpable thrill in graft immediately.  One repair stitch was placed.  At this point both incisions were inspected there was still some tunnel bleeding.  I was concerned that may be the new graft was tunneled adjacent to some branches of the fistula that may have torn.  So in addition to oversewing the end of the fistula I also made an additional incision over the proximal aspect of the fistula dissected free circumferentially and ligated this in 2 separate areas with a 2-0 silk tie.  The tunnel bleeding seemed to improve a little bit with this.  There was still some oozing.  Patient was given 80 mg of protamine to assist with hemostasis.  Hemoblast was applied to the proximal  and distal incisions for 3 minutes.  All the wounds were then thoroughly irrigated.  Hemostasis was pretty good at this point.  Subcutaneous tissues of all 3 incisions were closed with a running 3-0 Vicryl suture.  Skin of all incisions closed with a 4-0 Vicryl subcuticular stitch.  Dermabond was applied to all incisions.  Patient had a weakly palpable radial pulse at the end of  the case.  Patient tolerated the procedure well and there were no complications.  Instrument sponge and needle counts were correct at the end of the case.  Patient was taken the recovery in stable condition.  Ruta Hinds, MD [facility] Office: [phone]

## 2019-07-12 NOTE — Anesthesia Procedure Notes (Signed)
Procedure Name: MAC Date/Time: 07/12/2019 10:31 AM Performed by: Orlie Dakin, CRNA Pre-anesthesia Checklist: Patient identified, Emergency Drugs available, Suction available and Patient being monitored Patient Re-evaluated:Patient Re-evaluated prior to induction Oxygen Delivery Method: Simple face mask Preoxygenation: Pre-oxygenation with 100% oxygen Induction Type: IV induction Placement Confirmation: positive ETCO2

## 2019-07-13 ENCOUNTER — Encounter: Payer: Self-pay | Admitting: *Deleted

## 2019-07-26 ENCOUNTER — Other Ambulatory Visit: Payer: Self-pay

## 2019-07-26 ENCOUNTER — Encounter: Payer: Self-pay | Admitting: Orthopaedic Surgery

## 2019-07-26 ENCOUNTER — Ambulatory Visit (INDEPENDENT_AMBULATORY_CARE_PROVIDER_SITE_OTHER): Payer: Medicare HMO | Admitting: Orthopaedic Surgery

## 2019-07-26 ENCOUNTER — Ambulatory Visit (INDEPENDENT_AMBULATORY_CARE_PROVIDER_SITE_OTHER): Payer: Medicare HMO

## 2019-07-26 VITALS — Ht 67.0 in | Wt 170.0 lb

## 2019-07-26 DIAGNOSIS — Z96642 Presence of left artificial hip joint: Secondary | ICD-10-CM

## 2019-07-26 NOTE — Progress Notes (Signed)
Post-Op Visit Note   Patient: Bianca Myers           Date of Birth: 1945/06/27           MRN: [id number] Visit Date: 07/26/2019 PCP: [name], MD   Assessment & Plan:  Chief Complaint:  Chief Complaint  Patient presents with  . Left Hip - Follow-up    Left hemiarthroplasty DOS 06/10/2019   Visit Diagnoses:  1. Status post left hip replacement     Plan: Bianca Myers is 6 weeks status post left partial hip replacement.  She is still at a nursing home.  She is confused.  Doing physical therapy 5 times a week.  Surgical scar is fully healed.  No signs of infection.  X-rays are unremarkable.  Unfortunately patient's confusion does limit her progress with physical therapy.  From my standpoint she is stable.  We can see her back as needed.  Follow-Up Instructions: Return if symptoms worsen or fail to improve.   Orders:  Orders Placed This Encounter  Procedures  . XR HIP UNILAT W OR W/O PELVIS 2-3 VIEWS LEFT   No orders of the defined types were placed in this encounter.   Imaging: XR HIP UNILAT W OR W/O PELVIS 2-3 VIEWS LEFT  Result Date: 07/26/2019 Stable left partial hip replacement without complication.   PMFS History: Patient Active Problem List   Diagnosis Date Noted  . Left displaced femoral neck fracture ([employer])   . Syncope 06/07/2019  . MSSA bacteremia 03/10/2019  . Diabetes mellitus ([place]) 03/10/2019  . 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  . Mild aortic stenosis 03/10/2019  . HCAP (healthcare-associated pneumonia) 03/09/2019  . Heart failure ([place]) 08/31/2018  . Anemia of chronic kidney failure, stage 5 ([place]) 08/27/2018  . Chronic depression 08/27/2018  . Patient is Jehovah's Witness 08/27/2018  . Essential hypertension   . End-stage renal disease on hemodialysis ([place]) 12/10/2015   Past Medical History:  Diagnosis Date  . Anemia   . Arthritis   . Chronic kidney disease (CKD) stage G5/A1, glomerular filtration rate (GFR) less than or equal to 15  mL/min/1.73 square meter and albuminuria creatinine ratio less than 30 mg/g (HCC)    Dialysis M/W/F  . Depression   . Diabetes mellitus ([place])   . Gout   . History of anemia due to chronic kidney disease   . History of degenerative disc disease   . Hypertension   . Pneumonia   . Secondary hyperparathyroidism ([place])   . Spondylisthesis     Family History  Family history unknown: Yes    Past Surgical History:  Procedure Laterality Date  . A/V FISTULAGRAM Left 12/16/2018   Procedure: A/V FISTULAGRAM;  Surgeon: Marty Heck, MD;  Location: [facility];  Service: Cardiovascular;  Laterality: Left;  . A/V FISTULAGRAM Left 06/13/2019   Procedure: A/V FISTULAGRAM;  Surgeon: Marty Heck, MD;  Location: [facility];  Service: Cardiovascular;  Laterality: Left;  . AV FISTULA PLACEMENT Left 08/24/2018   Procedure: ARTERIOVENOUS (AV) FISTULA CREATION;  Surgeon: Waynetta Sandy, MD;  Location: [facility];  Service: Vascular;  Laterality: Left;  . AV FISTULA PLACEMENT Left 07/12/2019   Procedure: INSERTION OF LEFT UPPER ARM  ARTERIOVENOUS (AV) GORE-TEX GRAFT;  Surgeon: Elam Dutch, MD;  Location: [facility];  Service: Vascular;  Laterality: Left;  . BASCILIC VEIN TRANSPOSITION Left 11/04/2018   Procedure: BASCILIC VEIN TRANSPOSITION SECOND STAGE;  Surgeon: Waynetta Sandy, MD;  Location: [facility];  Service:  Vascular;  Laterality: Left;  . COLONOSCOPY    . HIP SURGERY    . INSERTION OF DIALYSIS CATHETER N/A 06/15/2019   Procedure: INSERTION OF TUNNELED  DIALYSIS CATHETER RIGHT INTERNAL JUGULAR;  Surgeon: Serafina Mitchell, MD;  Location: [facility];  Service: Vascular;  Laterality: N/A;  . IR FLUORO GUIDE CV LINE RIGHT  08/21/2018  . IR FLUORO GUIDE CV LINE RIGHT  06/08/2019  . IR REMOVAL TUN CV CATH W/O FL  03/11/2019  . IR US GUIDE VASC ACCESS RIGHT  08/21/2018  . IR US GUIDE VASC ACCESS RIGHT  06/08/2019  . PERIPHERAL VASCULAR BALLOON ANGIOPLASTY Left 06/13/2019    Procedure: PERIPHERAL VASCULAR BALLOON ANGIOPLASTY;  Surgeon: Marty Heck, MD;  Location: [facility];  Service: Cardiovascular;  Laterality: Left;  AVF  . PERIPHERAL VASCULAR INTERVENTION Left 12/16/2018   Procedure: PERIPHERAL VASCULAR INTERVENTION;  Surgeon: Marty Heck, MD;  Location: [facility];  Service: Cardiovascular;  Laterality: Left;  arm fistula  . TOTAL HIP ARTHROPLASTY Left 06/10/2019   Procedure: TOTAL HIP ARTHROPLASTY ANTERIOR APPROACH;  Surgeon: Leandrew Koyanagi, MD;  Location: [facility];  Service: Orthopedics;  Laterality: Left;   Social History   Occupational History  . Not on file  Tobacco Use  . Smoking status: Former Smoker    Quit date: 1973    Years since quitting: 48.4  . Smokeless tobacco: Never Used  Substance and Sexual Activity  . Alcohol use: Not Currently  . Drug use: Never  . Sexual activity: Not on file

## 2019-07-28 ENCOUNTER — Other Ambulatory Visit: Payer: Self-pay

## 2019-07-28 ENCOUNTER — Encounter: Payer: Self-pay | Admitting: Physician Assistant

## 2019-07-28 ENCOUNTER — Ambulatory Visit (INDEPENDENT_AMBULATORY_CARE_PROVIDER_SITE_OTHER): Payer: Self-pay | Admitting: Physician Assistant

## 2019-07-28 VITALS — BP 162/70 | HR 63 | Temp 97.7°F | Resp 20 | Ht 67.0 in | Wt 170.0 lb

## 2019-07-28 DIAGNOSIS — Z992 Dependence on renal dialysis: Secondary | ICD-10-CM

## 2019-07-28 DIAGNOSIS — N186 End stage renal disease: Secondary | ICD-10-CM

## 2019-07-28 NOTE — Progress Notes (Signed)
  POST OPERATIVE OFFICE NOTE    CC:  F/u for surgery  HPI:  This is a 74 y.o. female who is s/p Ligation of left upper arm AV fistula, placement of left upper arm AV graft on by Dr. Oneida Alar.  Pt returns today for follow up.   She denise pain, loss of motor and loss of sensation in the left hand.    No Known Allergies  Current Outpatient Medications  Medication Sig Dispense Refill  . acetaminophen (TYLENOL) 650 MG CR tablet Take 1,300 mg by mouth every 8 (eight) hours as needed for pain.    Marland Kitchen amLODipine (NORVASC) 10 MG tablet Take 1 tablet (10 mg total) by mouth daily.    . Ascorbic Acid (VITAMIN C) 1000 MG tablet Take 1,000 mg by mouth 2 (two) times a day.     Marland Kitchen atorvastatin (LIPITOR) 10 MG tablet Take 10 mg by mouth at bedtime.    . Cholecalciferol 100 MCG (4000 UT) CAPS Take 4,000 Units by mouth daily.    Marland Kitchen enoxaparin (LOVENOX) 30 MG/0.3ML injection Inject 0.3 mLs (30 mg total) into the skin daily. 13 mL 0  . escitalopram (LEXAPRO) 10 MG tablet Take 10 mg by mouth daily.    . ferric citrate (AURYXIA) 1 GM 210 MG(Fe) tablet Take 420 mg by mouth 3 (three) times daily with meals.    . furosemide (LASIX) 40 MG tablet Take 1 tablet (40 mg total) by mouth every Tuesday, Thursday, Saturday, and Sunday. 30 tablet 0  . labetalol (NORMODYNE) 200 MG tablet Take 200 mg by mouth 2 (two) times daily.    Marland Kitchen lidocaine-prilocaine (EMLA) cream Apply 1 application topically as directed. Prior to Dialysis    . loperamide (IMODIUM) 2 MG capsule Take 4 mg by mouth every 4 (four) hours as needed for diarrhea or loose stools.     . melatonin 3 MG TABS tablet Take 6 mg by mouth at bedtime.    . multivitamin (RENA-VIT) TABS tablet Take 1 tablet by mouth at bedtime. 30 tablet 0  . Omega-3 Fatty Acids (FISH OIL) 1000 MG CAPS Take 1,000 mg by mouth 2 (two) times daily.     . Oxycodone HCl 10 MG TABS Take 1 tablet (10 mg total) by mouth every 4 (four) hours as needed (pain). 12 tablet 0  . polyethylene glycol  (MIRALAX) 17 g packet Take 17 g by mouth daily as needed for moderate constipation. (Patient taking differently: Take 17 g by mouth 2 (two) times daily as needed for moderate constipation. ) 14 each 0  . senna (SENOKOT) 8.6 MG TABS tablet Take 2 tablets by mouth at bedtime.    . Turmeric 500 MG CAPS Take 500 mg by mouth daily.      No current facility-administered medications for this visit.     ROS:  See HPI  Physical Exam:    Incision:  Incisions healing well Extremities:  Grip 5/5. Sensation intact and palpable radial purples.   Lungs : non labored breathing   Assessment/Plan:  This is a 74 y.o. female who is s/p:Ligation of left upper arm AV fistula, placement of left upper arm AV graft The graft was placed on 07/12/2019.  It may be accessed 08/12/2019 F/U PRN  Roxy Horseman PA-C [facility] [phone]  Clinic MD:  Scot Dock

## 2019-08-11 ENCOUNTER — Telehealth: Payer: Self-pay | Admitting: Orthopaedic Surgery

## 2019-08-11 NOTE — Telephone Encounter (Signed)
Laveda Abbe from [facility] called  Needs to know the patient's weight baring status   Call back: [phone]

## 2019-08-11 NOTE — Telephone Encounter (Signed)
WBAT

## 2019-08-12 NOTE — Telephone Encounter (Signed)
Called no answer LMOM with details.

## 2019-12-17 ENCOUNTER — Encounter (HOSPITAL_COMMUNITY): Payer: Self-pay | Admitting: Emergency Medicine

## 2019-12-17 ENCOUNTER — Emergency Department (HOSPITAL_COMMUNITY): Payer: Medicare HMO

## 2019-12-17 ENCOUNTER — Other Ambulatory Visit: Payer: Self-pay

## 2019-12-17 ENCOUNTER — Inpatient Hospital Stay (HOSPITAL_COMMUNITY)
Admission: EM | Admit: 2019-12-17 | Discharge: 2019-12-26 | DRG: 981 | Disposition: A | Discharge status: Home Health | Payer: Medicare HMO | Attending: Internal Medicine | Admitting: Internal Medicine

## 2019-12-17 DIAGNOSIS — S2241XA Multiple fractures of ribs, right side, initial encounter for closed fracture: Principal | ICD-10-CM | POA: Diagnosis present

## 2019-12-17 DIAGNOSIS — I132 Hypertensive heart and chronic kidney disease with heart failure and with stage 5 chronic kidney disease, or end stage renal disease: Secondary | ICD-10-CM | POA: Diagnosis present

## 2019-12-17 DIAGNOSIS — W1830XA Fall on same level, unspecified, initial encounter: Secondary | ICD-10-CM | POA: Diagnosis present

## 2019-12-17 DIAGNOSIS — Z20822 Contact with and (suspected) exposure to covid-19: Secondary | ICD-10-CM | POA: Diagnosis present

## 2019-12-17 DIAGNOSIS — E119 Type 2 diabetes mellitus without complications: Secondary | ICD-10-CM

## 2019-12-17 DIAGNOSIS — W19XXXA Unspecified fall, initial encounter: Secondary | ICD-10-CM | POA: Diagnosis present

## 2019-12-17 DIAGNOSIS — M25572 Pain in left ankle and joints of left foot: Secondary | ICD-10-CM | POA: Diagnosis present

## 2019-12-17 DIAGNOSIS — R0902 Hypoxemia: Secondary | ICD-10-CM | POA: Diagnosis not present

## 2019-12-17 DIAGNOSIS — I959 Hypotension, unspecified: Secondary | ICD-10-CM | POA: Diagnosis present

## 2019-12-17 DIAGNOSIS — F32A Depression, unspecified: Secondary | ICD-10-CM | POA: Diagnosis present

## 2019-12-17 DIAGNOSIS — E785 Hyperlipidemia, unspecified: Secondary | ICD-10-CM | POA: Diagnosis present

## 2019-12-17 DIAGNOSIS — T148XXA Other injury of unspecified body region, initial encounter: Secondary | ICD-10-CM | POA: Diagnosis present

## 2019-12-17 DIAGNOSIS — E1122 Type 2 diabetes mellitus with diabetic chronic kidney disease: Secondary | ICD-10-CM | POA: Diagnosis present

## 2019-12-17 DIAGNOSIS — Z66 Do not resuscitate: Secondary | ICD-10-CM | POA: Diagnosis present

## 2019-12-17 DIAGNOSIS — R0781 Pleurodynia: Secondary | ICD-10-CM | POA: Diagnosis not present

## 2019-12-17 DIAGNOSIS — K5900 Constipation, unspecified: Secondary | ICD-10-CM | POA: Diagnosis present

## 2019-12-17 DIAGNOSIS — E8889 Other specified metabolic disorders: Secondary | ICD-10-CM | POA: Diagnosis present

## 2019-12-17 DIAGNOSIS — D631 Anemia in chronic kidney disease: Secondary | ICD-10-CM | POA: Diagnosis present

## 2019-12-17 DIAGNOSIS — Z96642 Presence of left artificial hip joint: Secondary | ICD-10-CM | POA: Diagnosis present

## 2019-12-17 DIAGNOSIS — N186 End stage renal disease: Secondary | ICD-10-CM

## 2019-12-17 DIAGNOSIS — E875 Hyperkalemia: Secondary | ICD-10-CM | POA: Diagnosis present

## 2019-12-17 DIAGNOSIS — S2249XA Multiple fractures of ribs, unspecified side, initial encounter for closed fracture: Secondary | ICD-10-CM | POA: Diagnosis present

## 2019-12-17 DIAGNOSIS — F419 Anxiety disorder, unspecified: Secondary | ICD-10-CM | POA: Diagnosis present

## 2019-12-17 DIAGNOSIS — K5730 Diverticulosis of large intestine without perforation or abscess without bleeding: Secondary | ICD-10-CM | POA: Diagnosis present

## 2019-12-17 DIAGNOSIS — N2581 Secondary hyperparathyroidism of renal origin: Secondary | ICD-10-CM | POA: Diagnosis present

## 2019-12-17 DIAGNOSIS — I712 Thoracic aortic aneurysm, without rupture: Secondary | ICD-10-CM | POA: Diagnosis present

## 2019-12-17 DIAGNOSIS — M109 Gout, unspecified: Secondary | ICD-10-CM | POA: Diagnosis present

## 2019-12-17 DIAGNOSIS — I7121 Aneurysm of the ascending aorta, without rupture: Secondary | ICD-10-CM | POA: Diagnosis present

## 2019-12-17 DIAGNOSIS — Z23 Encounter for immunization: Secondary | ICD-10-CM

## 2019-12-17 DIAGNOSIS — Z87891 Personal history of nicotine dependence: Secondary | ICD-10-CM

## 2019-12-17 DIAGNOSIS — Z79891 Long term (current) use of opiate analgesic: Secondary | ICD-10-CM

## 2019-12-17 DIAGNOSIS — M84454A Pathological fracture, pelvis, initial encounter for fracture: Secondary | ICD-10-CM | POA: Diagnosis present

## 2019-12-17 DIAGNOSIS — I5030 Unspecified diastolic (congestive) heart failure: Secondary | ICD-10-CM | POA: Diagnosis present

## 2019-12-17 DIAGNOSIS — Y92009 Unspecified place in unspecified non-institutional (private) residence as the place of occurrence of the external cause: Secondary | ICD-10-CM

## 2019-12-17 DIAGNOSIS — Z79899 Other long term (current) drug therapy: Secondary | ICD-10-CM

## 2019-12-17 DIAGNOSIS — T1490XA Injury, unspecified, initial encounter: Secondary | ICD-10-CM

## 2019-12-17 DIAGNOSIS — M47812 Spondylosis without myelopathy or radiculopathy, cervical region: Secondary | ICD-10-CM | POA: Diagnosis present

## 2019-12-17 DIAGNOSIS — Z992 Dependence on renal dialysis: Secondary | ICD-10-CM

## 2019-12-17 DIAGNOSIS — S12691A Other nondisplaced fracture of seventh cervical vertebra, initial encounter for closed fracture: Secondary | ICD-10-CM | POA: Diagnosis present

## 2019-12-17 LAB — CBC WITH DIFFERENTIAL/PLATELET
Abs Immature Granulocytes: 0.03 10*3/uL (ref 0.00–0.07)
Basophils Absolute: 0 10*3/uL (ref 0.0–0.1)
Basophils Relative: 0 %
Eosinophils Absolute: 0.3 10*3/uL (ref 0.0–0.5)
Eosinophils Relative: 3 %
HCT: 33.6 % — ABNORMAL LOW (ref 36.0–46.0)
Hemoglobin: 10.5 g/dL — ABNORMAL LOW (ref 12.0–15.0)
Immature Granulocytes: 0 %
Lymphocytes Relative: 21 %
Lymphs Abs: 2 10*3/uL (ref 0.7–4.0)
MCH: 26.8 pg (ref 26.0–34.0)
MCHC: 31.3 g/dL (ref 30.0–36.0)
MCV: 85.7 fL (ref 80.0–100.0)
Monocytes Absolute: 1 10*3/uL (ref 0.1–1.0)
Monocytes Relative: 10 %
Neutro Abs: 6.3 10*3/uL (ref 1.7–7.7)
Neutrophils Relative %: 66 %
Platelets: 225 10*3/uL (ref 150–400)
RBC: 3.92 MIL/uL (ref 3.87–5.11)
RDW: 17.8 % — ABNORMAL HIGH (ref 11.5–15.5)
WBC: 9.6 10*3/uL (ref 4.0–10.5)
nRBC: 0.2 % (ref 0.0–0.2)

## 2019-12-17 LAB — COMPREHENSIVE METABOLIC PANEL
ALT: 16 U/L (ref 0–44)
AST: 17 U/L (ref 15–41)
Albumin: 3.6 g/dL (ref 3.5–5.0)
Alkaline Phosphatase: 131 U/L — ABNORMAL HIGH (ref 38–126)
Anion gap: 15 (ref 5–15)
BUN: 45 mg/dL — ABNORMAL HIGH (ref 8–23)
CO2: 27 mmol/L (ref 22–32)
Calcium: 9.4 mg/dL (ref 8.9–10.3)
Chloride: 88 mmol/L — ABNORMAL LOW (ref 98–111)
Creatinine, Ser: 6.25 mg/dL — ABNORMAL HIGH (ref 0.44–1.00)
GFR, Estimated: 6 mL/min — ABNORMAL LOW (ref >60)
Glucose, Bld: 181 mg/dL — ABNORMAL HIGH (ref 70–99)
Potassium: 5 mmol/L (ref 3.5–5.1)
Sodium: 130 mmol/L — ABNORMAL LOW (ref 135–145)
Total Bilirubin: 0.5 mg/dL (ref 0.3–1.2)
Total Protein: 7.2 g/dL (ref 6.5–8.1)

## 2019-12-17 IMAGING — DX DG RIBS W/ CHEST 3+V*R*
3 series · 3 of 3 positions shown · non-contrast
Comparison: Chest radiograph and CT [DATE]

CLINICAL DATA: Fall with right-sided chest pain.

EXAM:
RIGHT RIBS AND CHEST - 3+ VIEW

[chest ap]
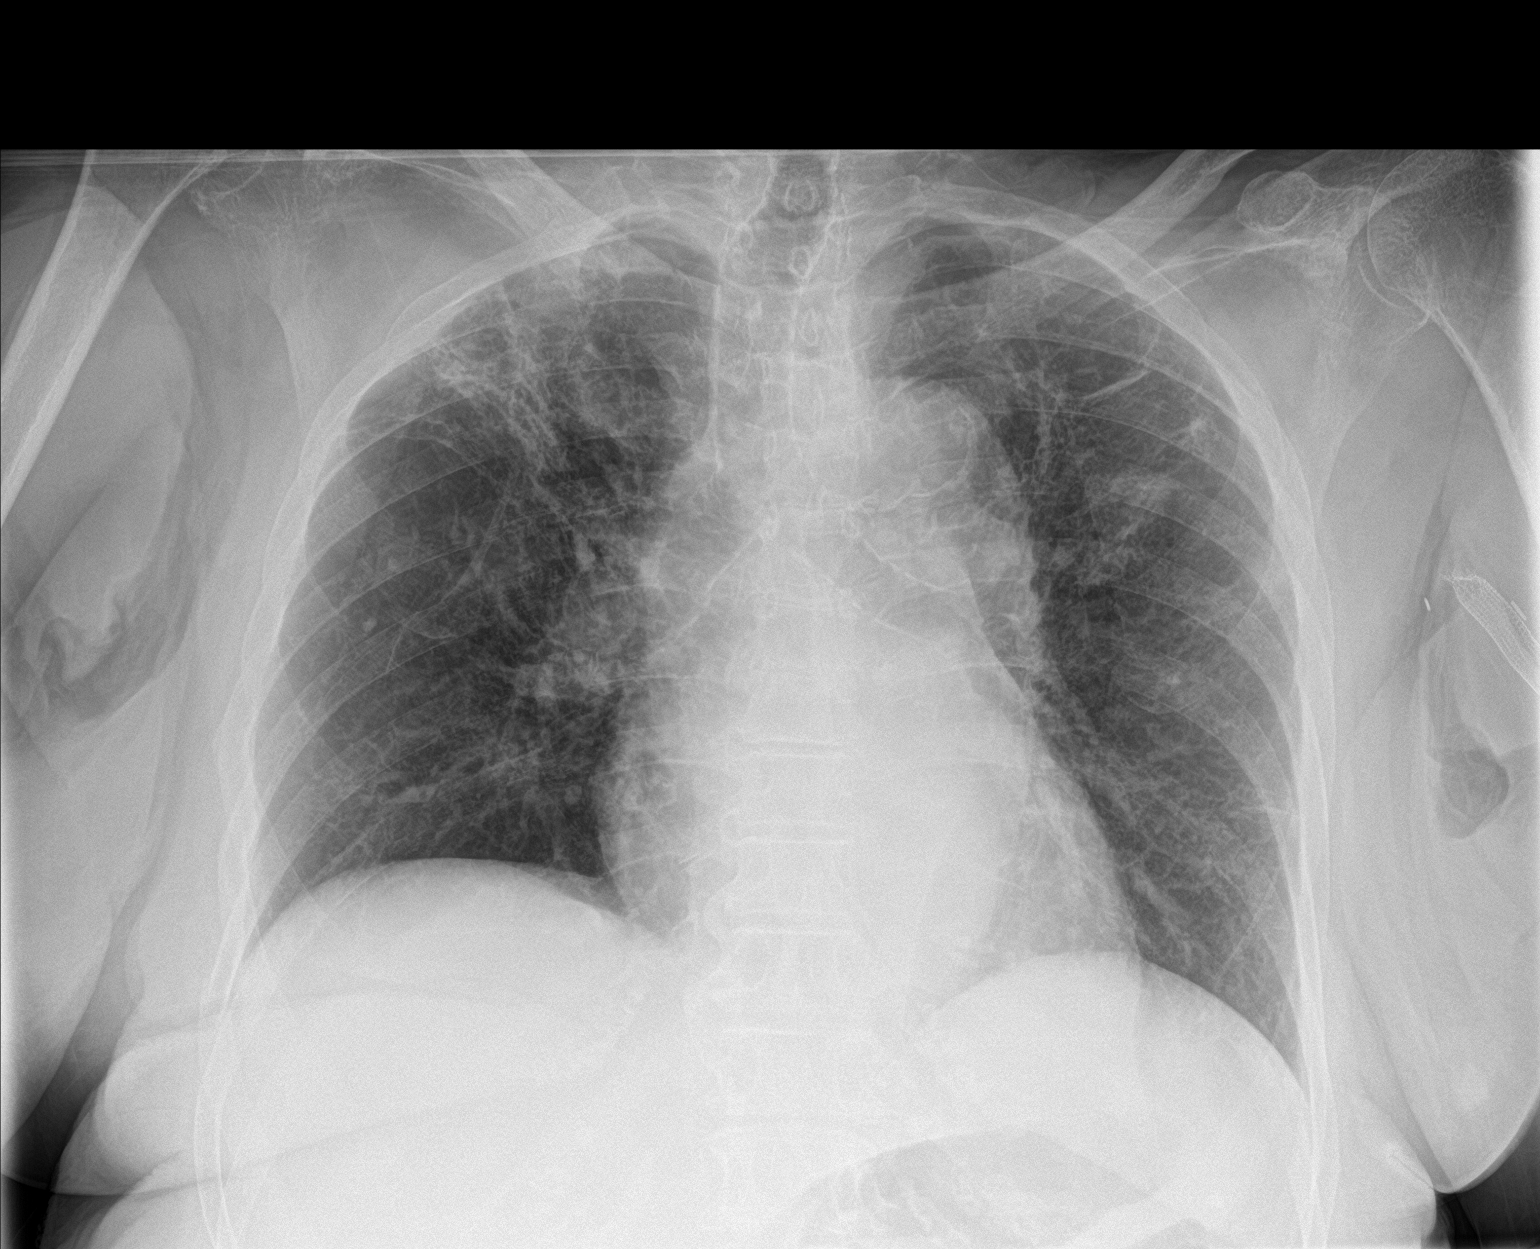

[rib ap]
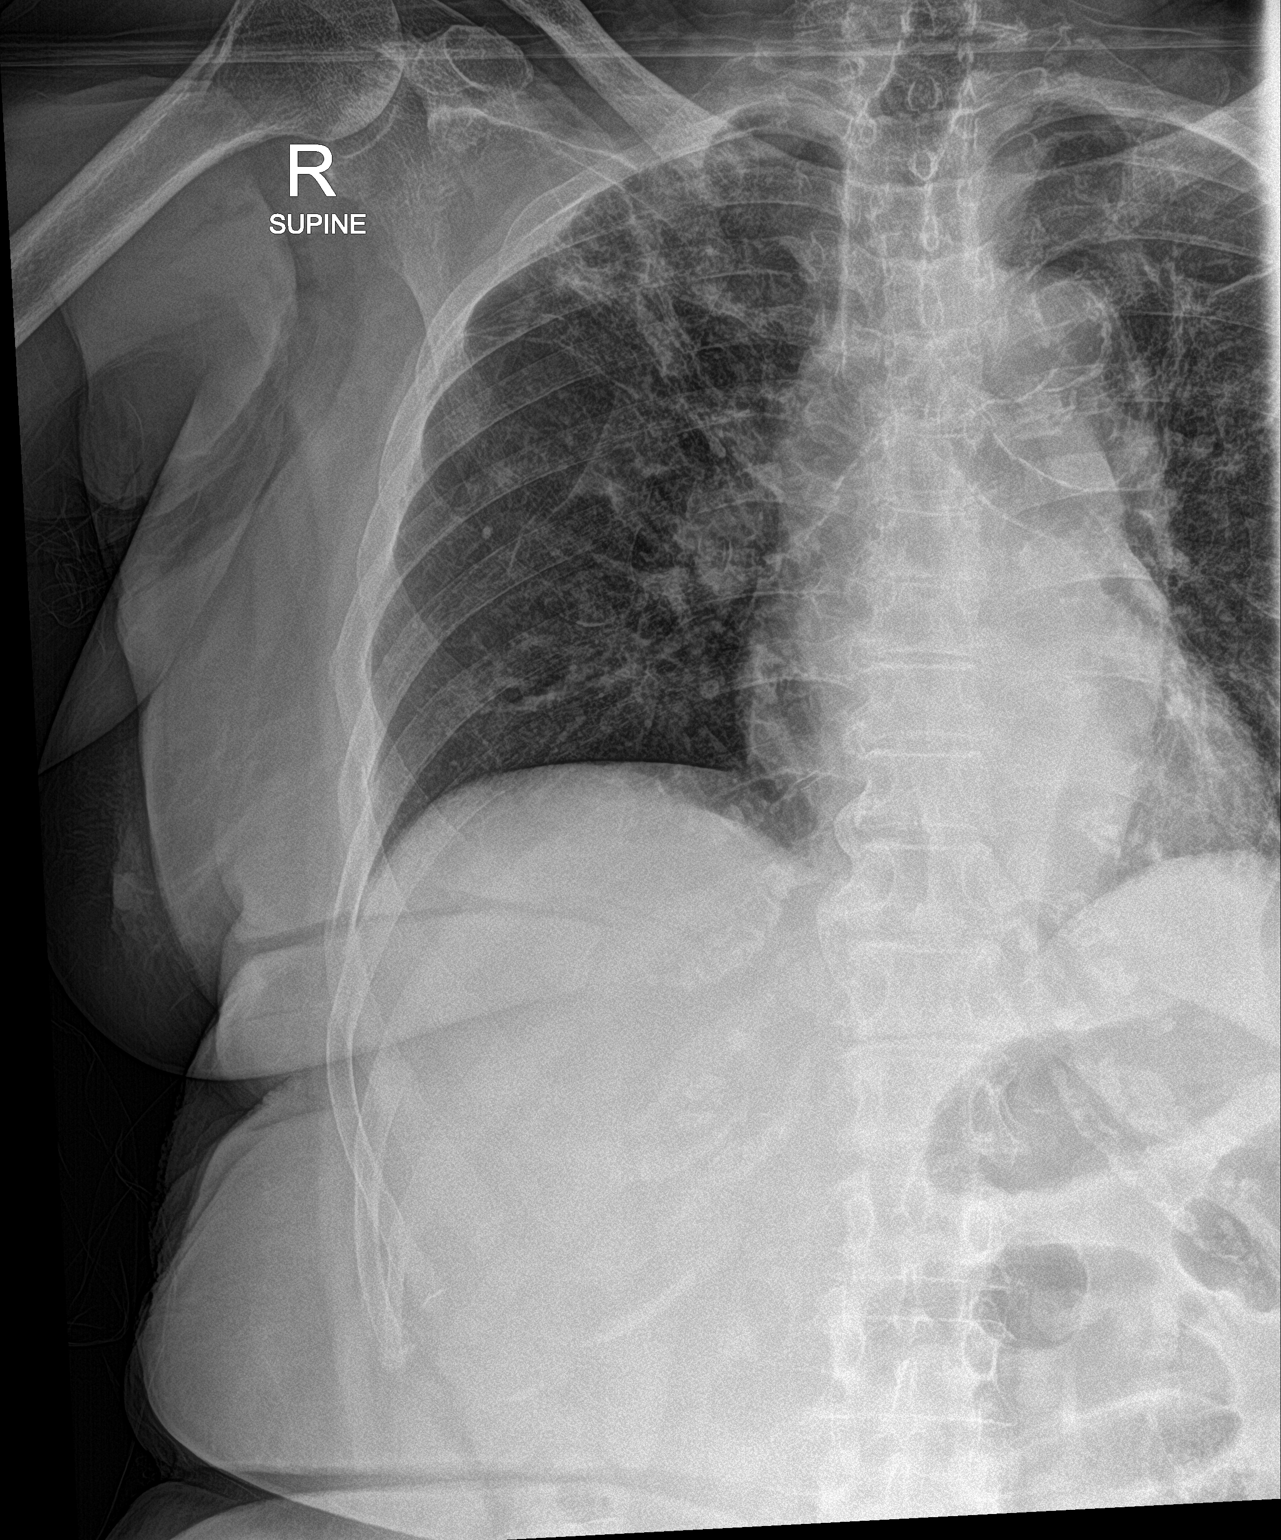

[rib ap obl]
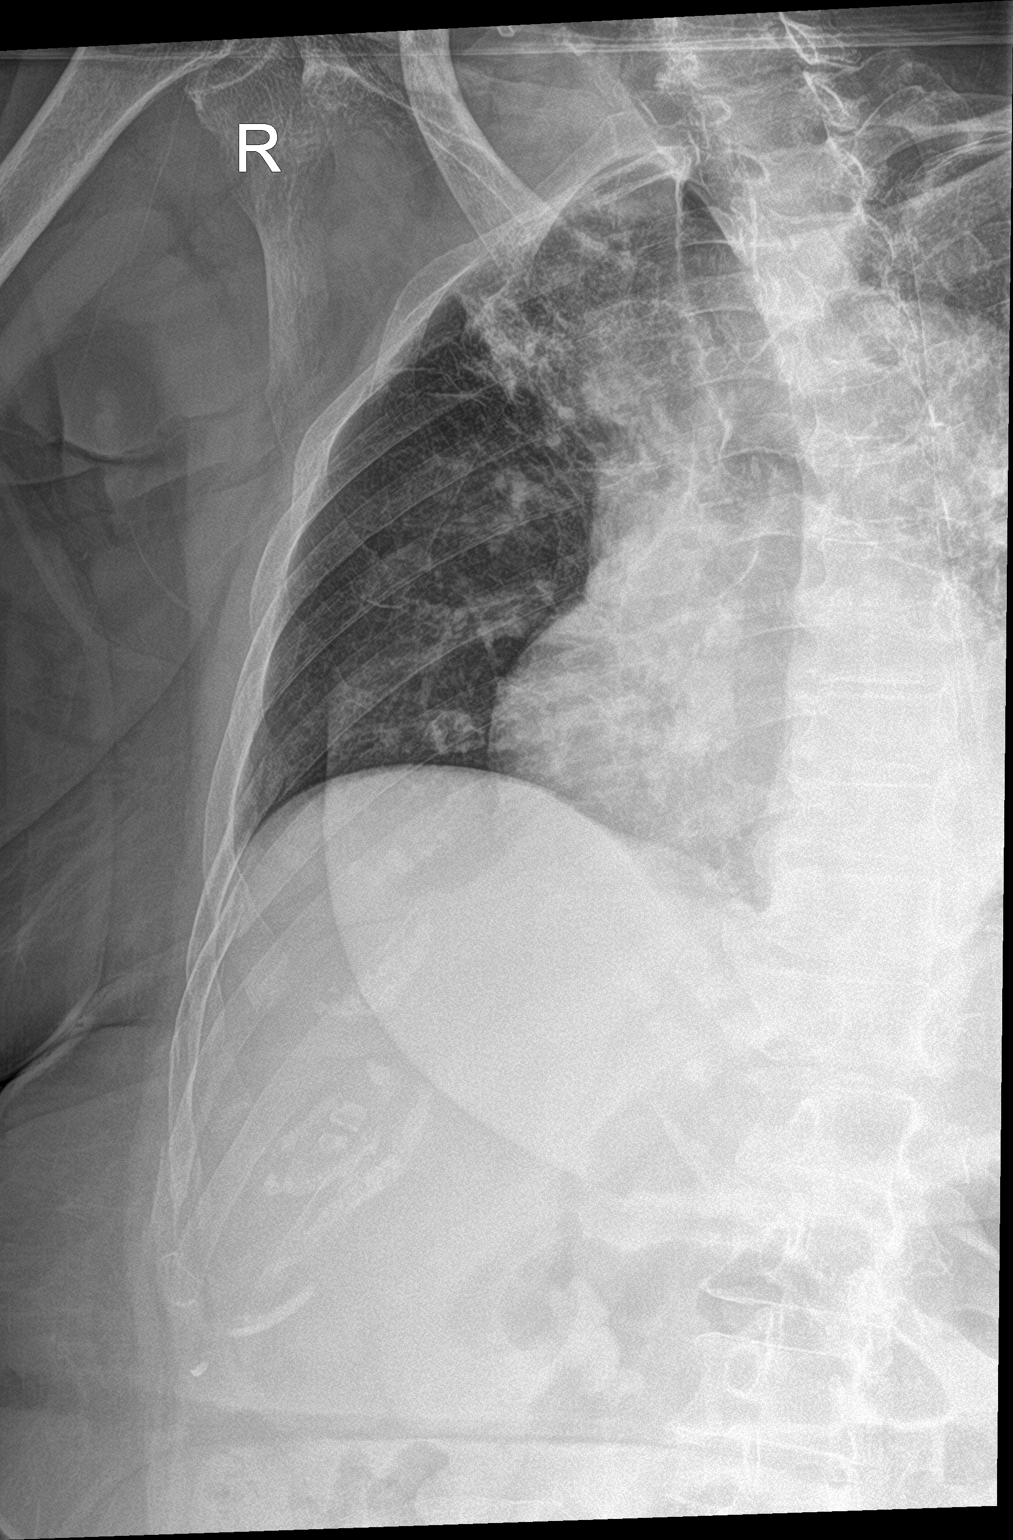

[3 of 3 positions shown; findings below may reference images not displayed]

FINDINGS: The cardiomediastinal silhouette is unchanged with normal heart
size. Aortic atherosclerosis is noted. Bilateral upper lobe scarring
and asymmetric right upper lobe volume loss are similar to the prior
radiograph. No acute airspace consolidation, edema, sizable pleural
effusion, or pneumothorax is identified. There are minimally
displaced right lateral sixth through ninth rib fractures. A
vascular stent is noted in the left upper arm.
IMPRESSION: Minimally displaced right lateral sixth through ninth rib fractures.

## 2019-12-17 NOTE — ED Triage Notes (Signed)
Patient lost her balance and fell yesterday at home , denies LOC , reports pain at right lateral ribcage , respirations unlabored/alert and oriented .

## 2019-12-18 ENCOUNTER — Inpatient Hospital Stay (HOSPITAL_COMMUNITY): Payer: Medicare HMO

## 2019-12-18 ENCOUNTER — Emergency Department (HOSPITAL_COMMUNITY): Payer: Medicare HMO

## 2019-12-18 DIAGNOSIS — S2241XA Multiple fractures of ribs, right side, initial encounter for closed fracture: Secondary | ICD-10-CM | POA: Diagnosis present

## 2019-12-18 DIAGNOSIS — R10811 Right upper quadrant abdominal tenderness: Secondary | ICD-10-CM | POA: Diagnosis not present

## 2019-12-18 DIAGNOSIS — K5900 Constipation, unspecified: Secondary | ICD-10-CM | POA: Diagnosis present

## 2019-12-18 DIAGNOSIS — E1122 Type 2 diabetes mellitus with diabetic chronic kidney disease: Secondary | ICD-10-CM | POA: Diagnosis present

## 2019-12-18 DIAGNOSIS — Z23 Encounter for immunization: Secondary | ICD-10-CM | POA: Diagnosis not present

## 2019-12-18 DIAGNOSIS — R0902 Hypoxemia: Secondary | ICD-10-CM | POA: Diagnosis not present

## 2019-12-18 DIAGNOSIS — I959 Hypotension, unspecified: Secondary | ICD-10-CM | POA: Diagnosis present

## 2019-12-18 DIAGNOSIS — Z79899 Other long term (current) drug therapy: Secondary | ICD-10-CM | POA: Diagnosis not present

## 2019-12-18 DIAGNOSIS — N186 End stage renal disease: Secondary | ICD-10-CM | POA: Diagnosis present

## 2019-12-18 DIAGNOSIS — I5030 Unspecified diastolic (congestive) heart failure: Secondary | ICD-10-CM | POA: Diagnosis present

## 2019-12-18 DIAGNOSIS — F419 Anxiety disorder, unspecified: Secondary | ICD-10-CM | POA: Diagnosis present

## 2019-12-18 DIAGNOSIS — I132 Hypertensive heart and chronic kidney disease with heart failure and with stage 5 chronic kidney disease, or end stage renal disease: Secondary | ICD-10-CM | POA: Diagnosis present

## 2019-12-18 DIAGNOSIS — E785 Hyperlipidemia, unspecified: Secondary | ICD-10-CM | POA: Diagnosis present

## 2019-12-18 DIAGNOSIS — Z66 Do not resuscitate: Secondary | ICD-10-CM | POA: Diagnosis present

## 2019-12-18 DIAGNOSIS — I712 Thoracic aortic aneurysm, without rupture: Secondary | ICD-10-CM | POA: Diagnosis present

## 2019-12-18 DIAGNOSIS — Z79891 Long term (current) use of opiate analgesic: Secondary | ICD-10-CM | POA: Diagnosis not present

## 2019-12-18 DIAGNOSIS — S12600A Unspecified displaced fracture of seventh cervical vertebra, initial encounter for closed fracture: Secondary | ICD-10-CM | POA: Diagnosis not present

## 2019-12-18 DIAGNOSIS — Z20822 Contact with and (suspected) exposure to covid-19: Secondary | ICD-10-CM | POA: Diagnosis present

## 2019-12-18 DIAGNOSIS — D631 Anemia in chronic kidney disease: Secondary | ICD-10-CM | POA: Diagnosis present

## 2019-12-18 DIAGNOSIS — R0781 Pleurodynia: Secondary | ICD-10-CM | POA: Diagnosis present

## 2019-12-18 DIAGNOSIS — E875 Hyperkalemia: Secondary | ICD-10-CM | POA: Diagnosis present

## 2019-12-18 DIAGNOSIS — T148XXA Other injury of unspecified body region, initial encounter: Secondary | ICD-10-CM | POA: Diagnosis present

## 2019-12-18 DIAGNOSIS — M109 Gout, unspecified: Secondary | ICD-10-CM | POA: Diagnosis present

## 2019-12-18 DIAGNOSIS — F32A Depression, unspecified: Secondary | ICD-10-CM | POA: Diagnosis present

## 2019-12-18 DIAGNOSIS — M84454A Pathological fracture, pelvis, initial encounter for fracture: Secondary | ICD-10-CM | POA: Diagnosis present

## 2019-12-18 DIAGNOSIS — Z992 Dependence on renal dialysis: Secondary | ICD-10-CM | POA: Diagnosis not present

## 2019-12-18 DIAGNOSIS — S12691A Other nondisplaced fracture of seventh cervical vertebra, initial encounter for closed fracture: Secondary | ICD-10-CM | POA: Diagnosis present

## 2019-12-18 DIAGNOSIS — N2581 Secondary hyperparathyroidism of renal origin: Secondary | ICD-10-CM | POA: Diagnosis present

## 2019-12-18 DIAGNOSIS — Y92009 Unspecified place in unspecified non-institutional (private) residence as the place of occurrence of the external cause: Secondary | ICD-10-CM | POA: Diagnosis not present

## 2019-12-18 DIAGNOSIS — W1830XA Fall on same level, unspecified, initial encounter: Secondary | ICD-10-CM | POA: Diagnosis present

## 2019-12-18 LAB — RESPIRATORY PANEL BY RT PCR (FLU A&B, COVID)
Influenza A by PCR: NEGATIVE
Influenza B by PCR: NEGATIVE
SARS Coronavirus 2 by RT PCR: NEGATIVE

## 2019-12-18 IMAGING — DX DG ANKLE COMPLETE 3+V*L*
3 series · 3 of 3 positions shown · non-contrast
Comparison: None.

CLINICAL DATA: Left ankle pain.

EXAM:
LEFT ANKLE COMPLETE - 3+ VIEW

[ankle ap]
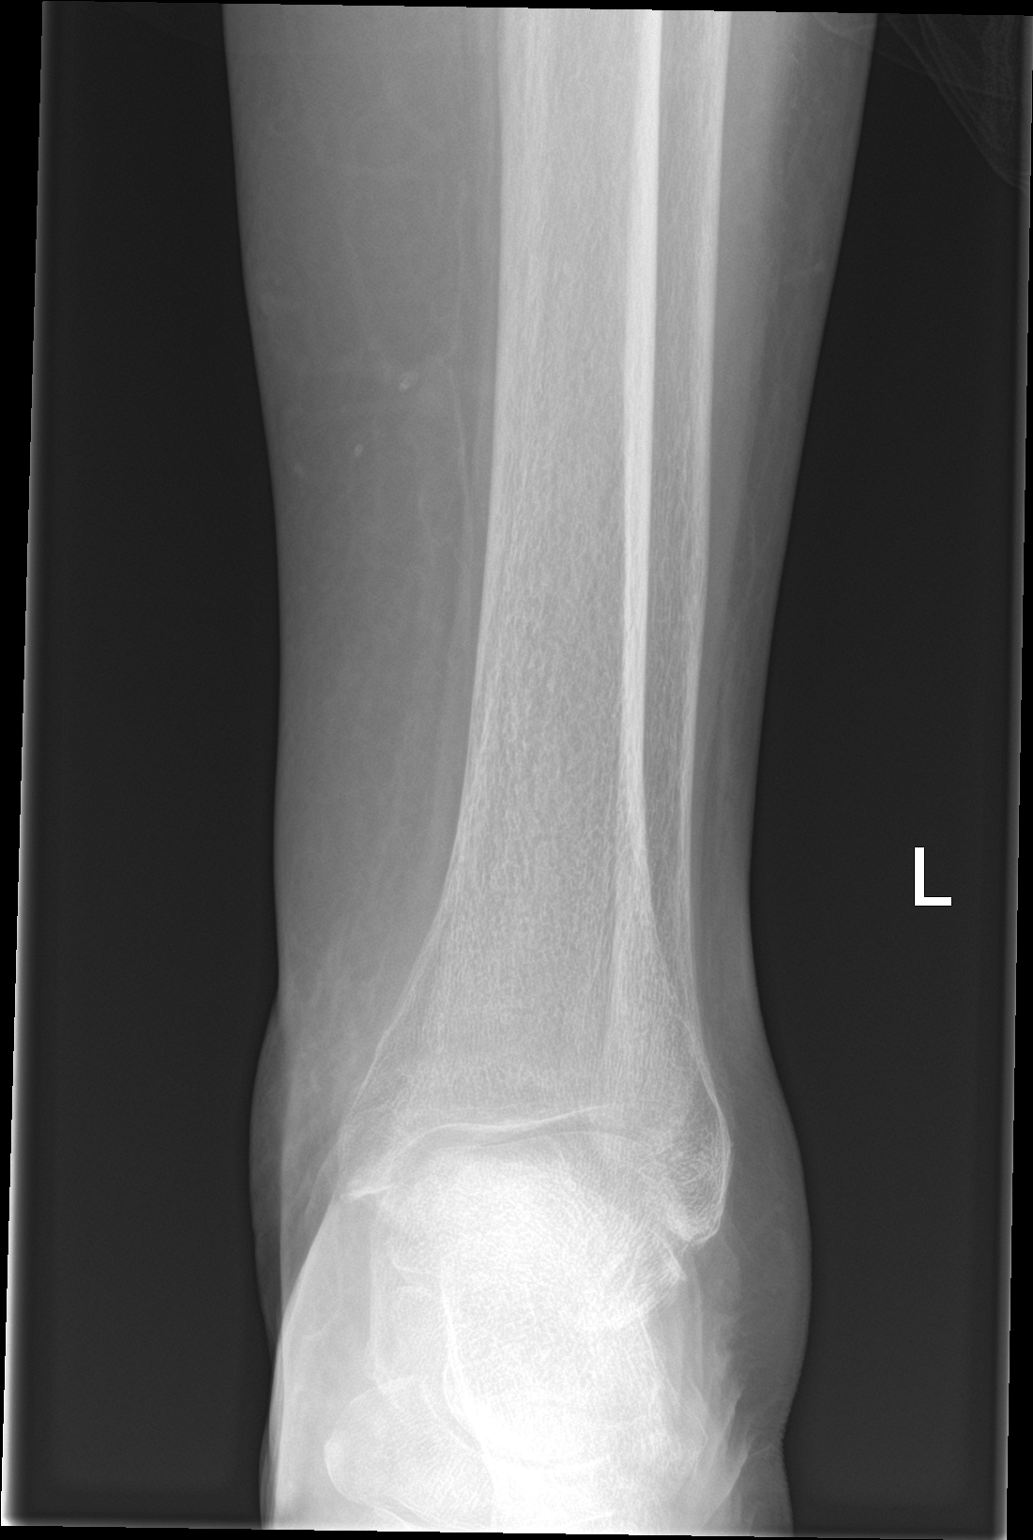

[ankle obl]
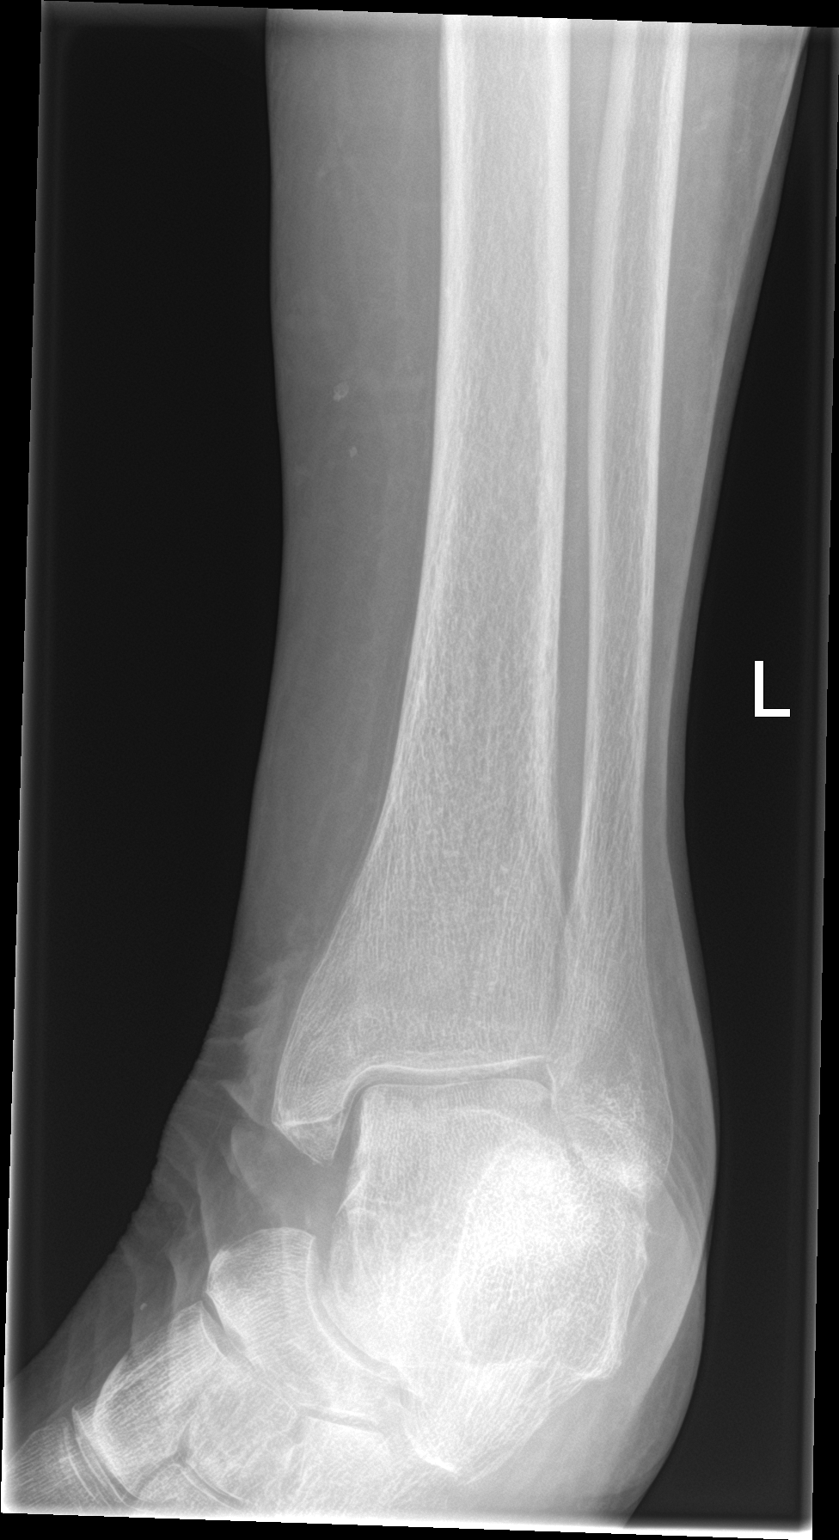

[ankle lat]
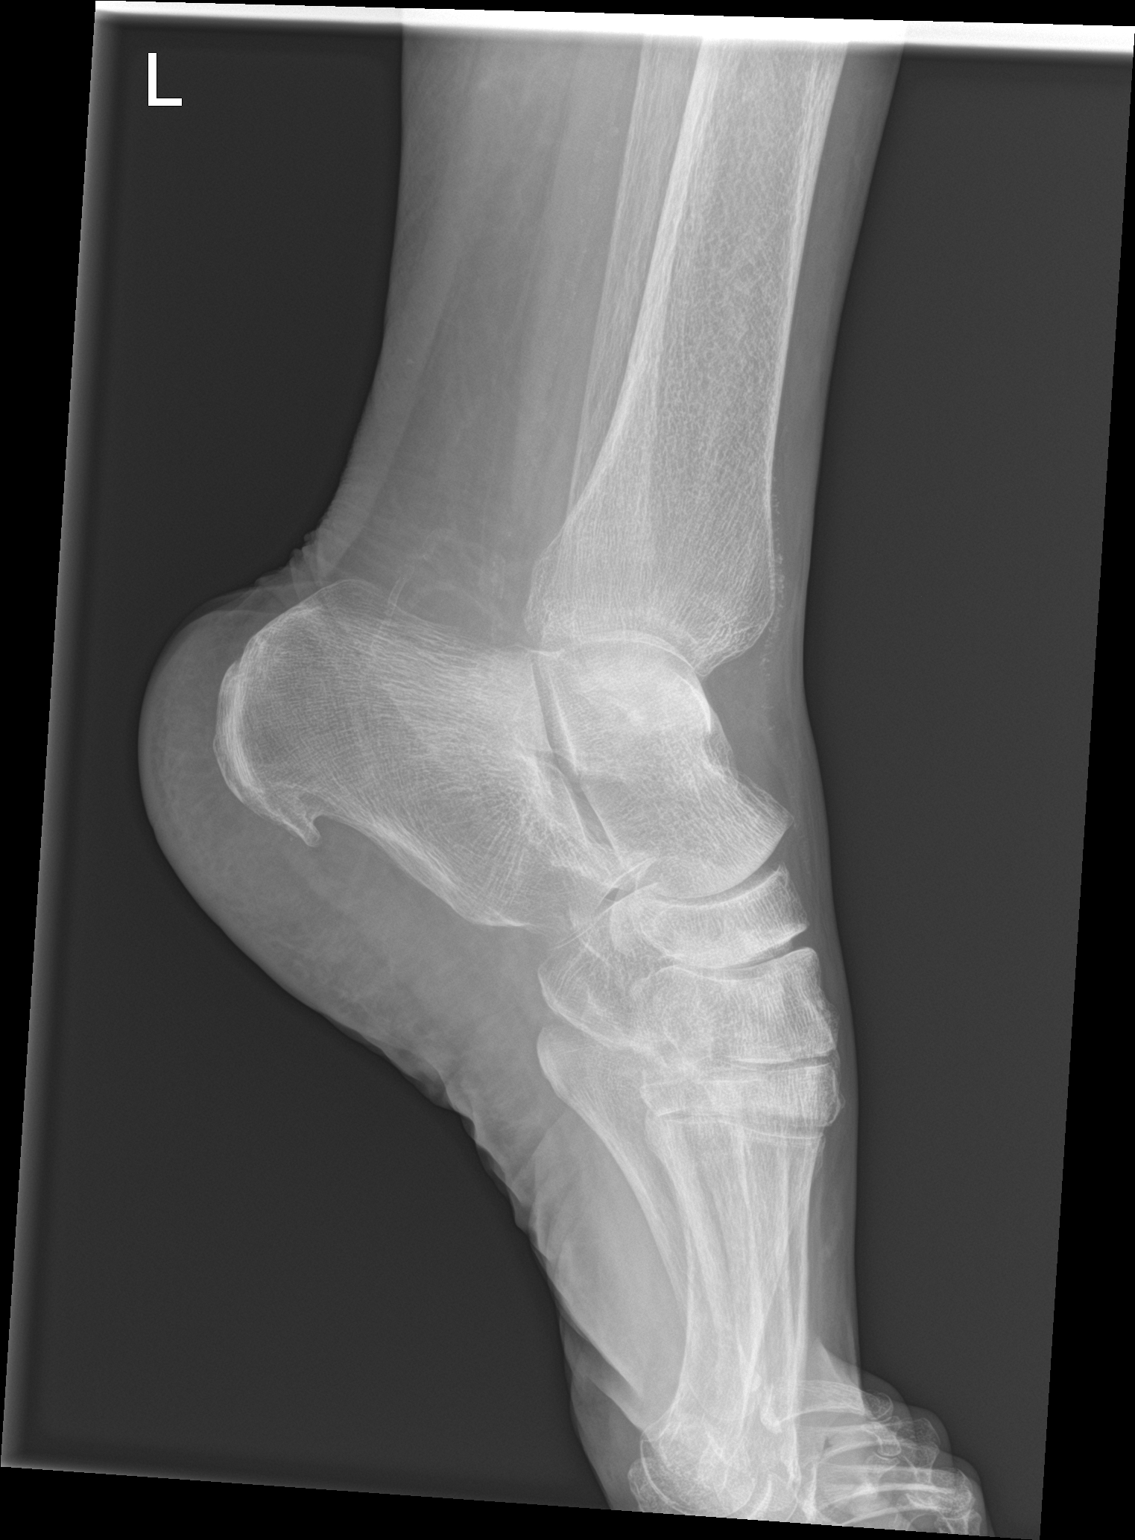

[3 of 3 positions shown; findings below may reference images not displayed]

FINDINGS: Bilateral soft tissue swelling. No fractures or dislocation.
Vascular calcifications.
IMPRESSION: Bilateral soft tissue swelling. No acute abnormalities otherwise
seen.

## 2019-12-18 IMAGING — CT CT CHEST W/O CM
2 of 4 series · 14 of 36 positions shown, 17 images · non-contrast
Comparison: [DATE]

CLINICAL DATA: Rib fracture suspected, traumatic

EXAM:
CT CHEST WITHOUT CONTRAST
TECHNIQUE: Multidetector CT imaging of the chest was performed following the
standard protocol without IV contrast.

[Series 3: chest wo · axial · 0.62mm/px · z∈[-493,-253]mm · 11 of 142 slices shown, 14 images]
[im 11/142  mediastinal]
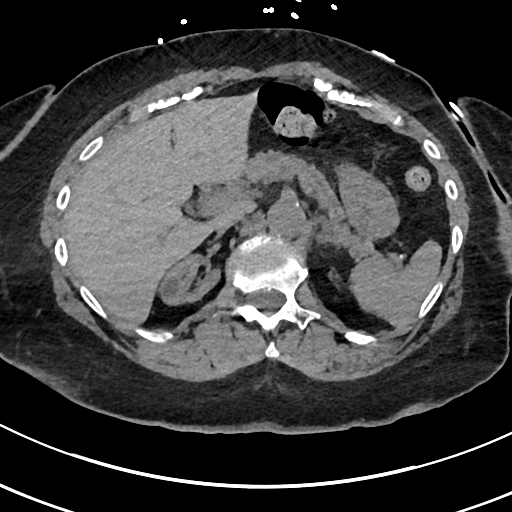
[im 11/142  lung]
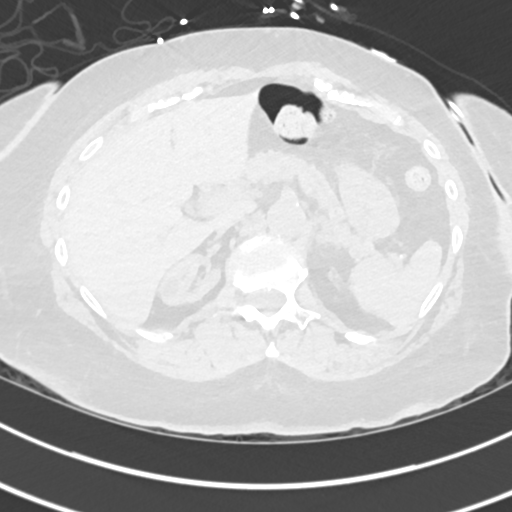
[im 22/142  lung]
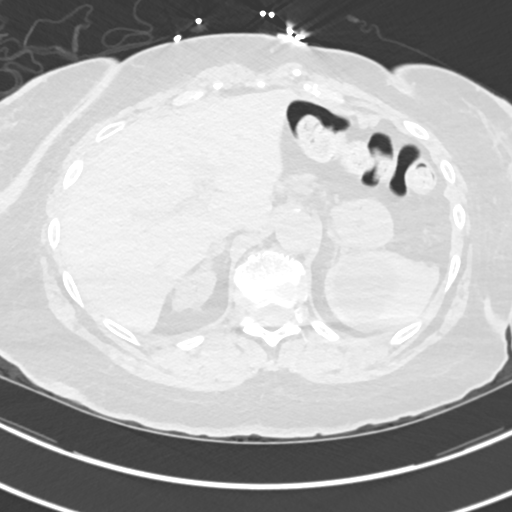
[im 33/142  lung]
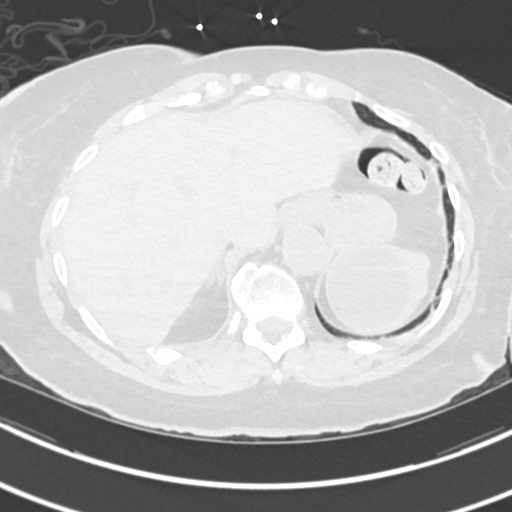
[im 44/142  lung]
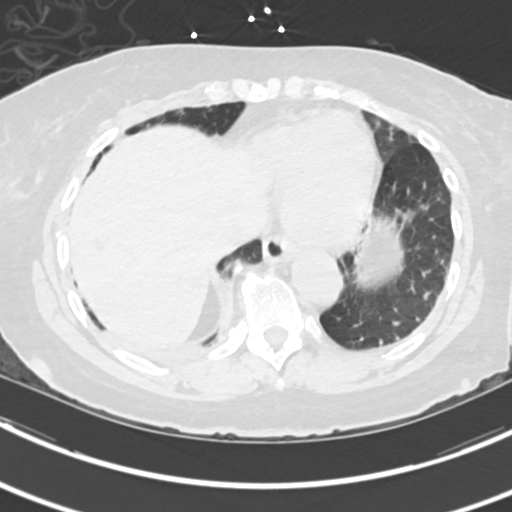
[im 55/142  mediastinal]
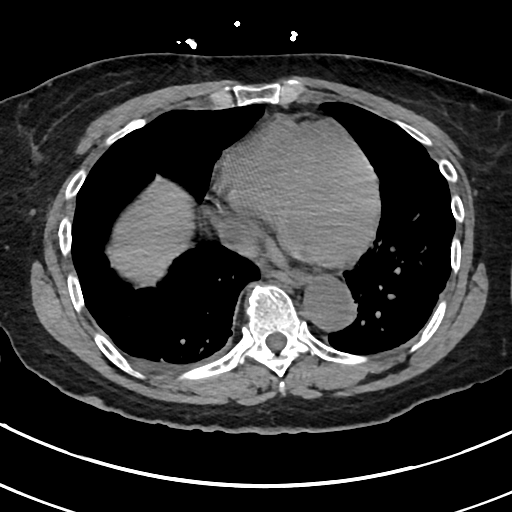
[im 55/142  lung]
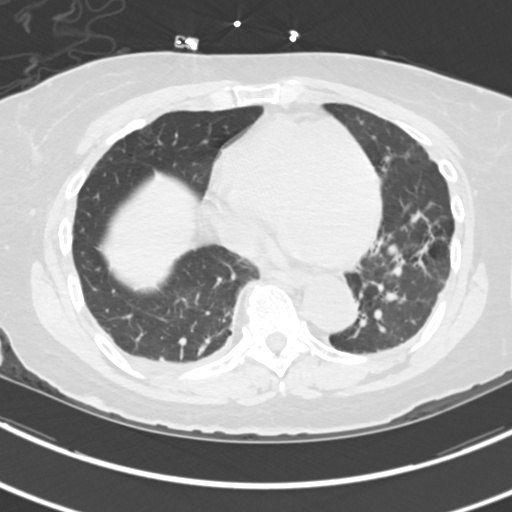
[im 76/142  lung]
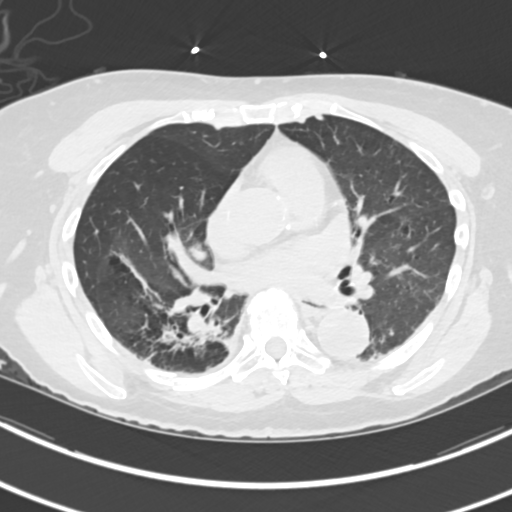
[im 87/142  lung]
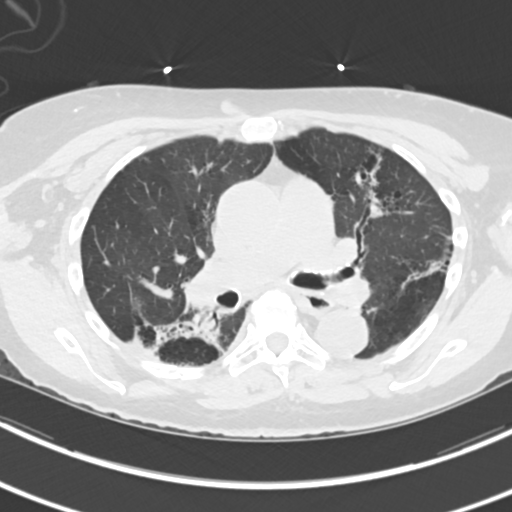
[im 98/142  lung]
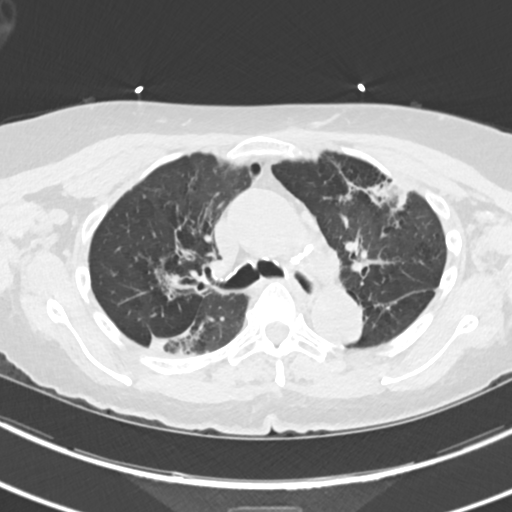
[im 109/142  mediastinal]
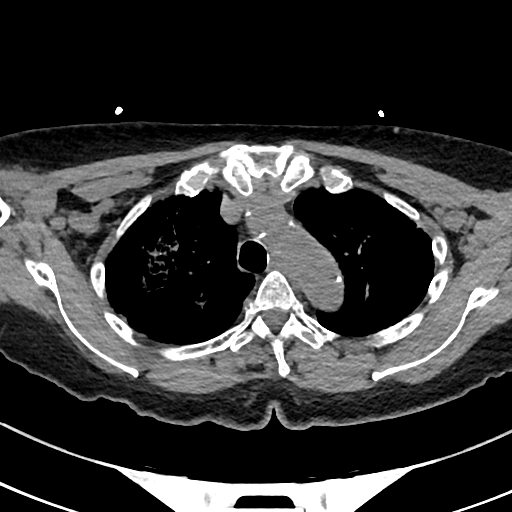
[im 109/142  lung]
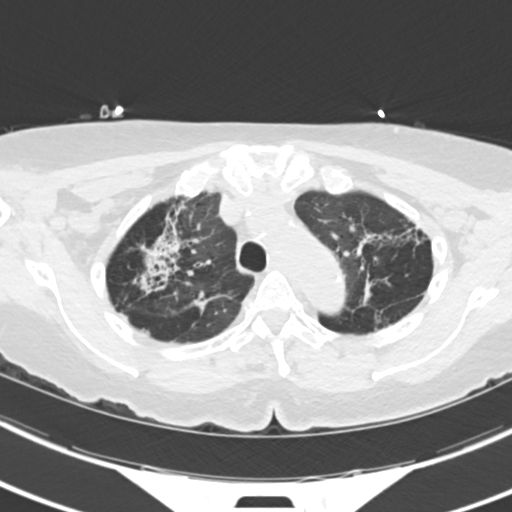
[im 120/142  lung]
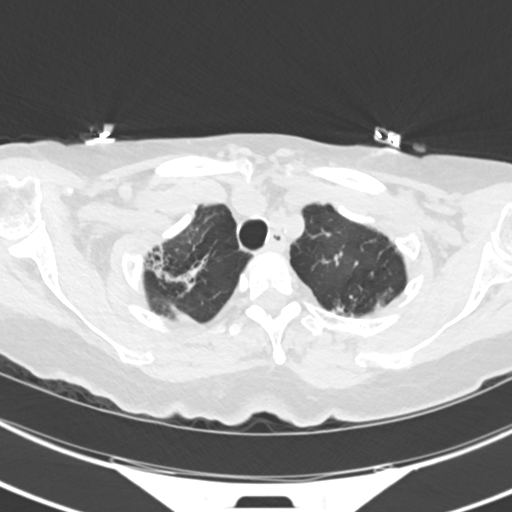
[im 131/142  lung]
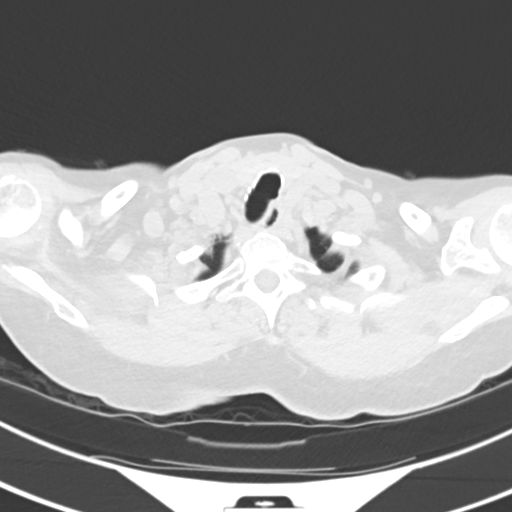

[Series 6: cor · coronal · 0.55mm/px · 3 of 129 slices shown]
[im 26/129  lung]
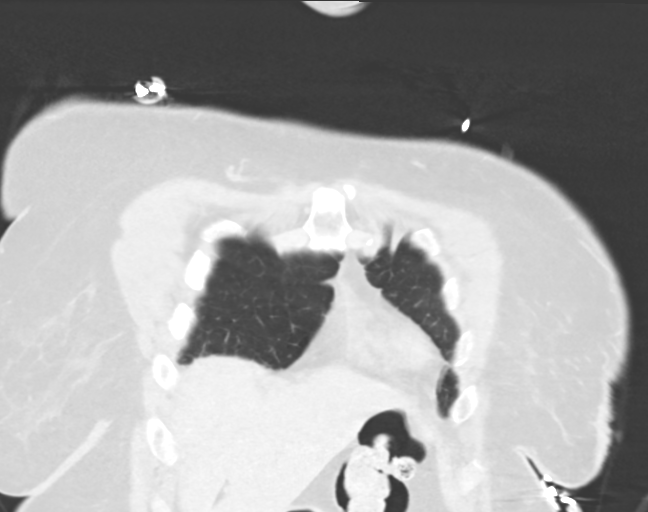
[im 52/129  lung]
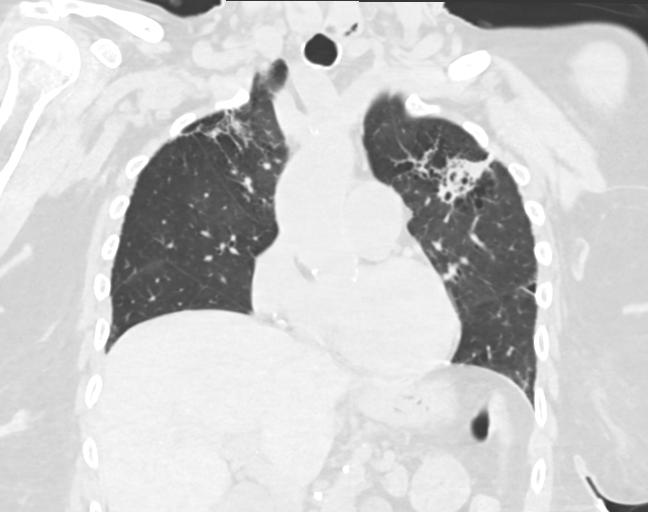
[im 77/129  lung]
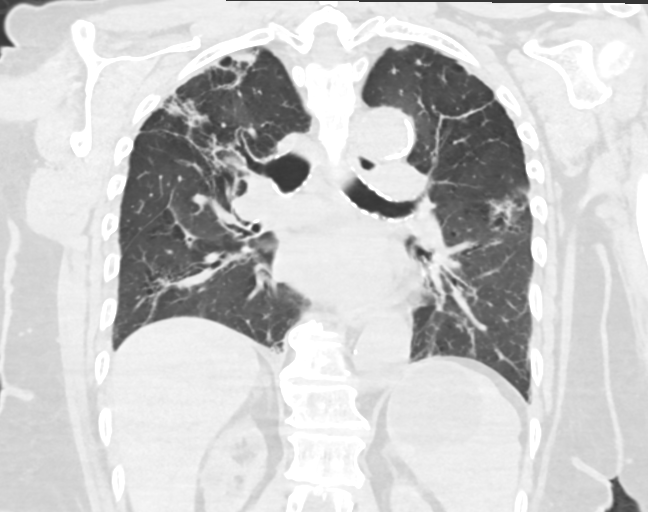

[14 of 36 positions shown; findings below may reference images not displayed]

FINDINGS: Cardiovascular: Aortic atherosclerosis. New enlargement of the
tubular ascending thoracic aorta, measuring up to 4.0 x 4.0 cm.
Normal heart size. Three-vessel coronary artery calcifications. No
pericardial effusion.

Mediastinum/Nodes: No enlarged mediastinal, hilar, or axillary lymph
nodes. Thyroid gland, trachea, and esophagus demonstrate no
significant findings.

Lungs/Pleura: There is a redemonstrated pattern of pulmonary
fibrosis featuring unusual, upper lobe predominant
peribronchovascular areas of fibrotic consolidation, architectural
distortion, bronchiectasis, and bronchiolectasis (series 4, image
34, 52). Mild centrilobular emphysema. Trace right pleural effusion.

Upper Abdomen: No acute abnormality. Large cyst or hemangioma of the
superior spleen, not significant changed compared to prior
examination. Atrophic kidneys, partially imaged.

Musculoskeletal: No chest wall mass or suspicious bone lesions
identified. There are minimally displaced fractures of the posterior
right seventh through eleventh ribs.
IMPRESSION: 1. There are minimally displaced fractures of the posterior right
seventh through eleventh ribs. Trace right pleural effusion. No
pneumothorax.
2. New enlargement of the tubular ascending thoracic aorta,
measuring up to 4.0 x 4.0 cm, somewhat concerning for acute aortic
injury in the setting of trauma. Consider contrast enhanced CT
angiogram to further evaluate. Aortic Atherosclerosis ([4F]-[4F])
3. There is a redemonstrated pattern of pulmonary fibrosis featuring
unusual, upper lobe predominant peribronchovascular areas of
fibrotic consolidation, architectural distortion, bronchiectasis,
and bronchiolectasis. Findings are in keeping with an "alternative
diagnosis" pattern of fibrosis by ATS pulmonary fibrosis criteria,
primary differential consideration chronic fibrotic hypersensitivity
pneumonitis or other inflammatory inhalational lung disease, or
alternately pulmonary sarcoidosis despite the absence of nodularity
or mediastinal lymphadenopathy.
4. Mild emphysema.  Emphysema ([4F]-[4F]).
5. Coronary artery disease.

## 2019-12-18 IMAGING — MR MR CERVICAL SPINE W/O CM
4 of 6 series · 18 of 48 positions shown · non-contrast
Comparison: None.

CLINICAL DATA: Spinal stenosis. Multiple acute right-sided rib
fractures after a fall.

EXAM:
MRI CERVICAL SPINE WITHOUT CONTRAST
TECHNIQUE: Multiplanar, multisequence MR imaging of the cervical spine was
performed. No intravenous contrast was administered.

[Series 2: T2 · sagittal · 3.0mm · 0.39mm/px · 5 of 15 slices shown (1 of 2)]
[im 1/15]
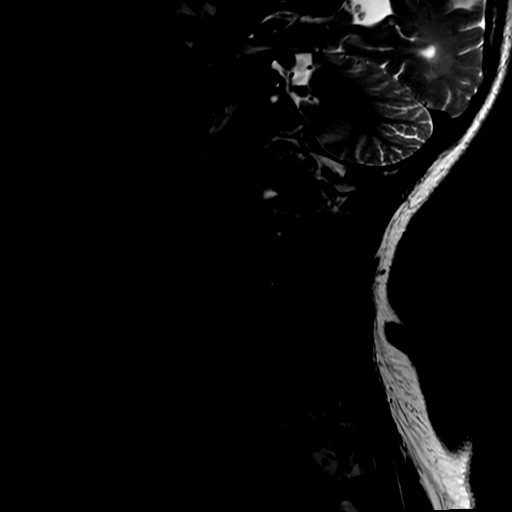
[im 4/15]
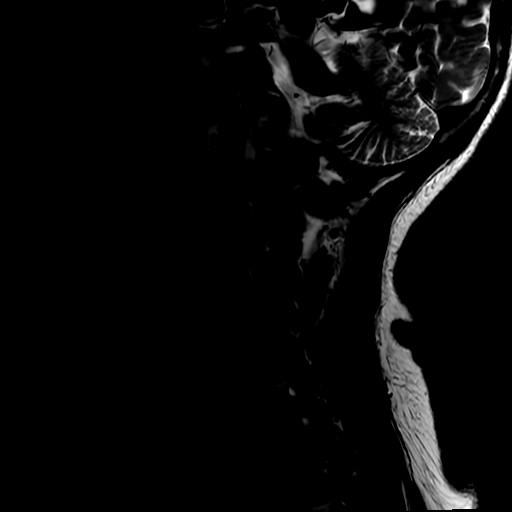
[im 8/15]
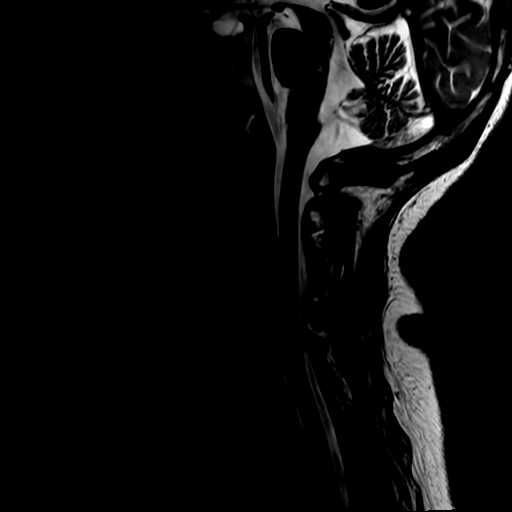
[im 11/15]
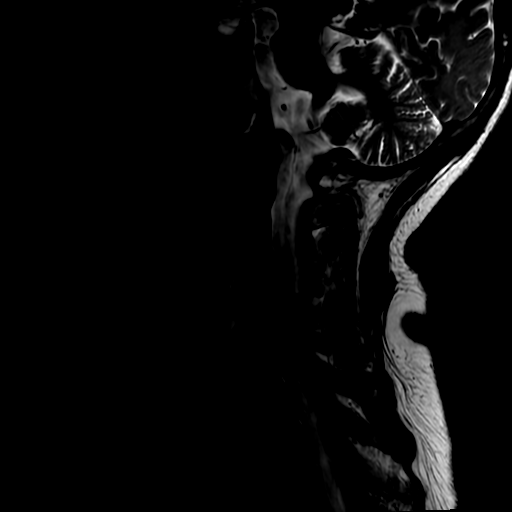
[im 15/15]
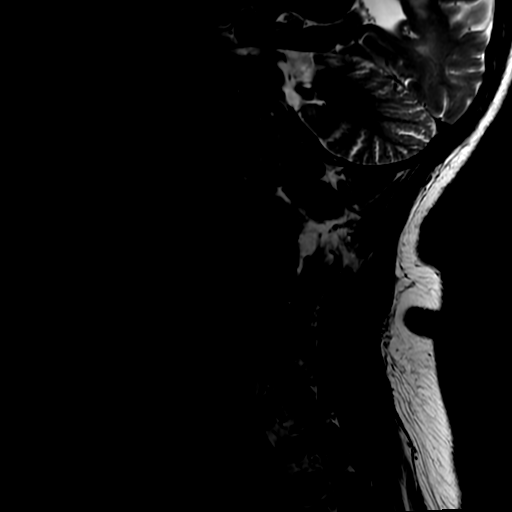

[Series 3: FLAIR · sagittal · 3.0mm · 0.39mm/px · 3 of 15 slices shown]
[im 1/15]
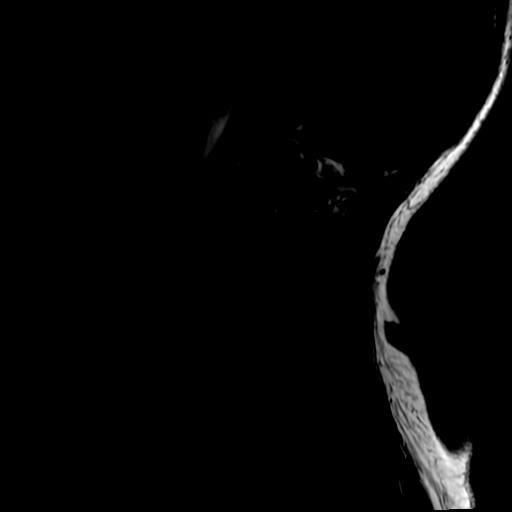
[im 10/15]
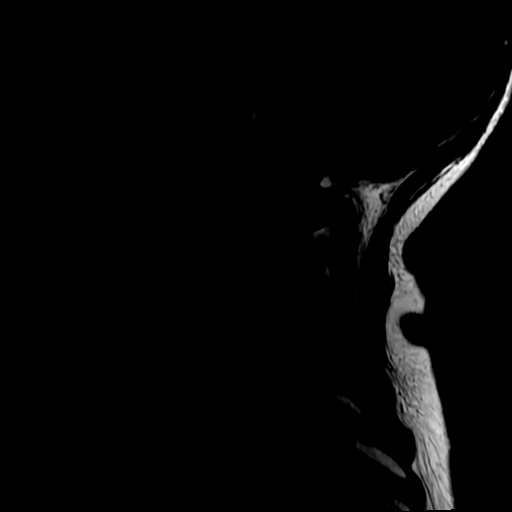
[im 15/15]
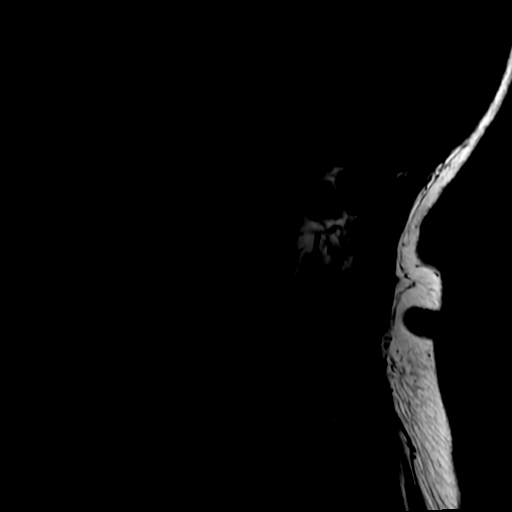

[Series 4: STIR · sagittal · 3.0mm · 0.39mm/px · 3 of 15 slices shown]
[im 1/15]
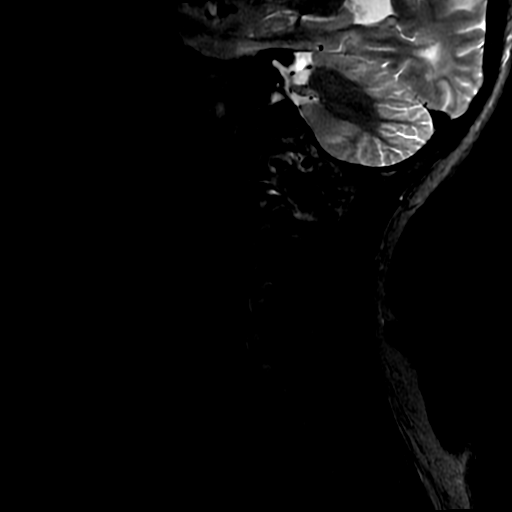
[im 10/15]
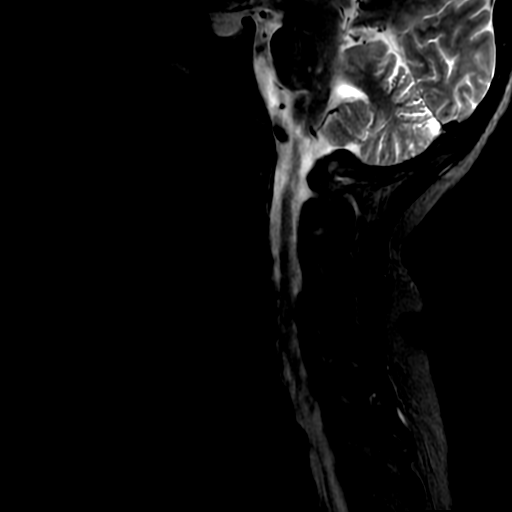
[im 15/15]
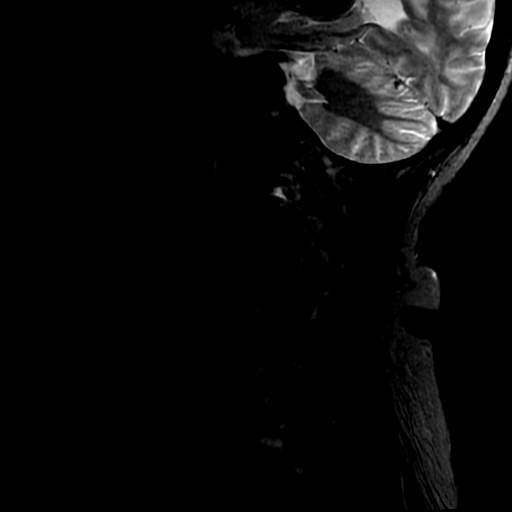

[Series 7: T2 · axial · 3.0mm · 0.35mm/px · z∈[-97,-26]mm · 7 of 28 slices shown (2 of 2)]
[im 1/28]
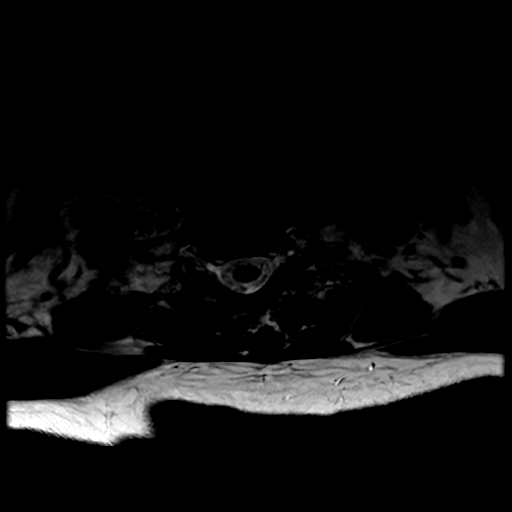
[im 4/28]
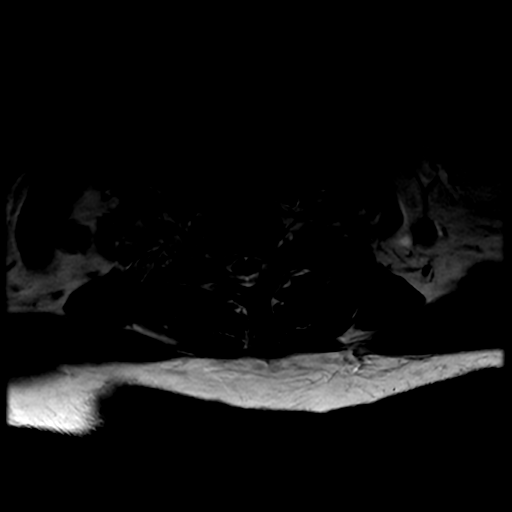
[im 8/28]
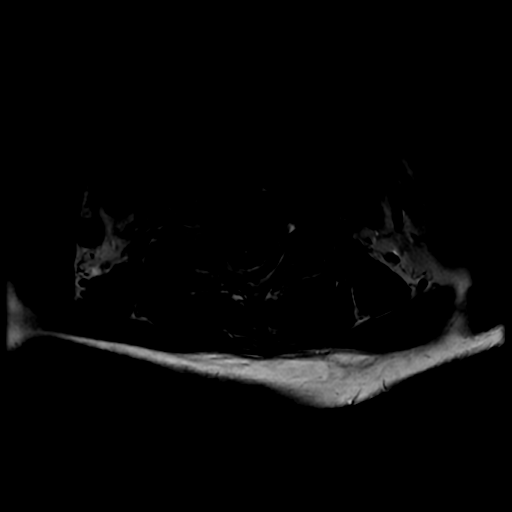
[im 12/28]
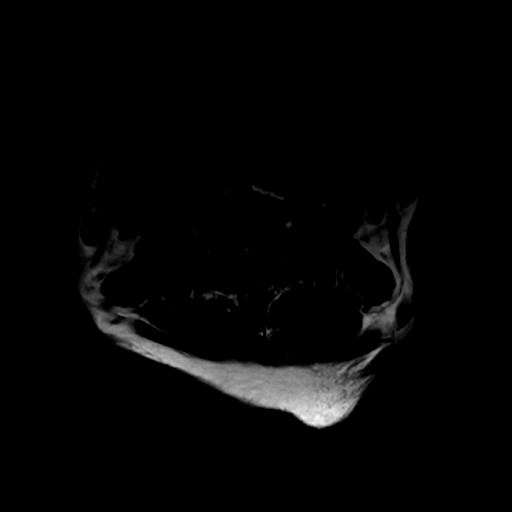
[im 16/28]
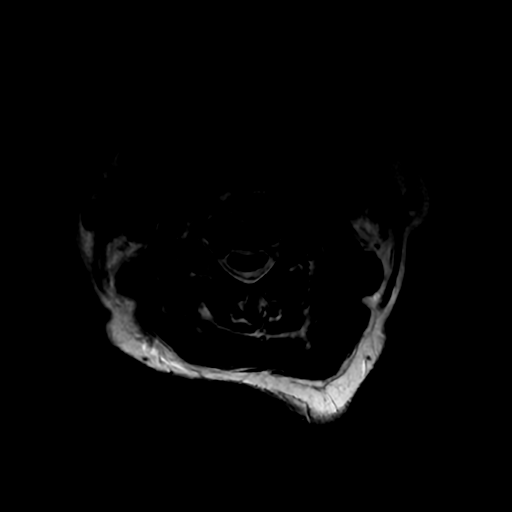
[im 20/28]
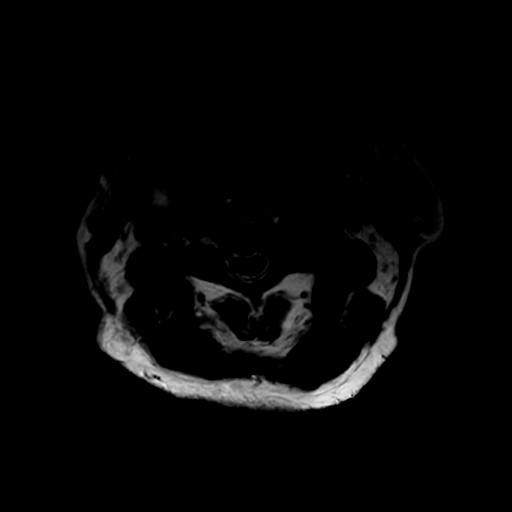
[im 24/28]
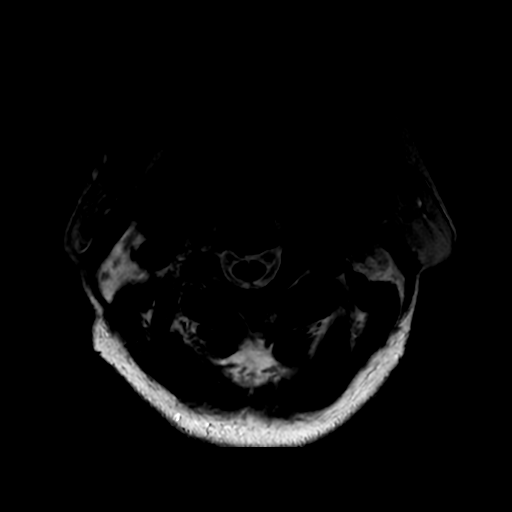

[18 of 48 positions shown; findings below may reference images not displayed]

FINDINGS: Despite efforts by the technologist and patient, motion artifact is
present on today's exam and could not be eliminated. This reduces
exam sensitivity and specificity.

Alignment: No vertebral subluxation is observed.

Vertebrae: Mild anterior wedging at C7 is present. I do not see
significant edema signal in the C7 vertebral body on STIR images,
but there is a band of low T1 and T2 signal centrally in the C7
vertebral body (no corresponding visible fracture on the CT). Also,
while the degree of anterior wedging is similar between the
[DATE] exam and the [DATE] CT examination, the degree of
sclerosis along the superior endplate of C[DATE] be minimally
increased, although this is equivocal. Irregularity along the
anterior superior endplate of C7 appears to be stable between the 2
CT examinations.

There is also mild superior endplate concavity at the T2 vertebral
level, not changed from [DATE], an without vertebral edema.

Disc desiccation is present throughout the cervical spine.

Cord: Taking into account the degree of motion artifact, no abnormal
cord signal is identified.

Posterior Fossa, vertebral arteries, paraspinal tissues: Partially
empty sella.

Disc levels:

C2-3: Unremarkable

C3-4: Mild central narrowing of the thecal sac and borderline
bilateral foraminal stenosis due to central disc protrusion,
uncinate spurring, and facet arthropathy.

C4-5: Prominent left and moderate right foraminal stenosis and
moderate central narrowing of the thecal sac due to intervertebral
spurring, disc bulge, uncinate spurring, and left facet arthropathy.

C5-6: Moderate bilateral foraminal stenosis and moderate central
narrowing of the thecal sac due to right eccentric disc bulge,
intervertebral spurring, and facet arthropathy.

C6-7: Moderate left and mild right foraminal stenosis due to disc
bulge, uncinate spurring, and facet arthropathy.

C7-T1: Unremarkable.
IMPRESSION: 1. There is no edema associated with the anterior wedging of C7.
Retrospectively a roughly similar appearance is present on
[DATE] compared to today's CT, with mild sclerosis along the
superior endplate and mild anterior wedging with the vertical height
of the vertebral body being about 1.0 cm in the area of wedging.
Accordingly I am skeptical of acute fracture, and I suspect this
appearance is due to chronic subsidence.
2. Cervical spondylosis and degenerative disc disease, causing
multilevel impingement as detailed above.
3. Partially empty sella.
4. Despite efforts by the technologist and patient, motion artifact
is present on today's exam and could not be eliminated. This reduces
exam sensitivity and specificity.

## 2019-12-18 IMAGING — CT CT CERVICAL SPINE W/O CM
3 of 4 series · 9 of 33 positions shown, 11 images · non-contrast
Comparison: CT head and CT cervical spine [DATE]

CLINICAL DATA: Pain following fall

EXAM:
CT HEAD WITHOUT CONTRAST
CT CERVICAL SPINE WITHOUT CONTRAST
TECHNIQUE: Multidetector CT imaging of the head and cervical spine was
performed following the standard protocol without intravenous
contrast. Multiplanar CT image reconstructions of the cervical spine
were also generated.

[Series 4: orthogonal axials · axial · 0.21mm/px · z∈[-230,-230]mm · 1 of 91 slices shown, 2 images]
[im 46/91  soft-tissue]
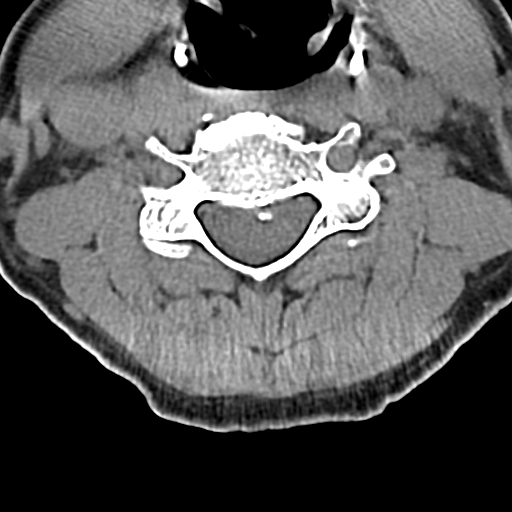
[im 46/91  bone]
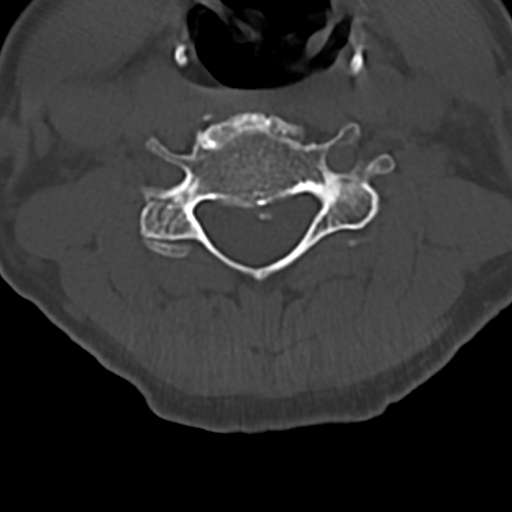

[Series 9: sag bone · sagittal · 0.21mm/px · 5 of 53 slices shown, 6 images]
[im 18/53  bone]
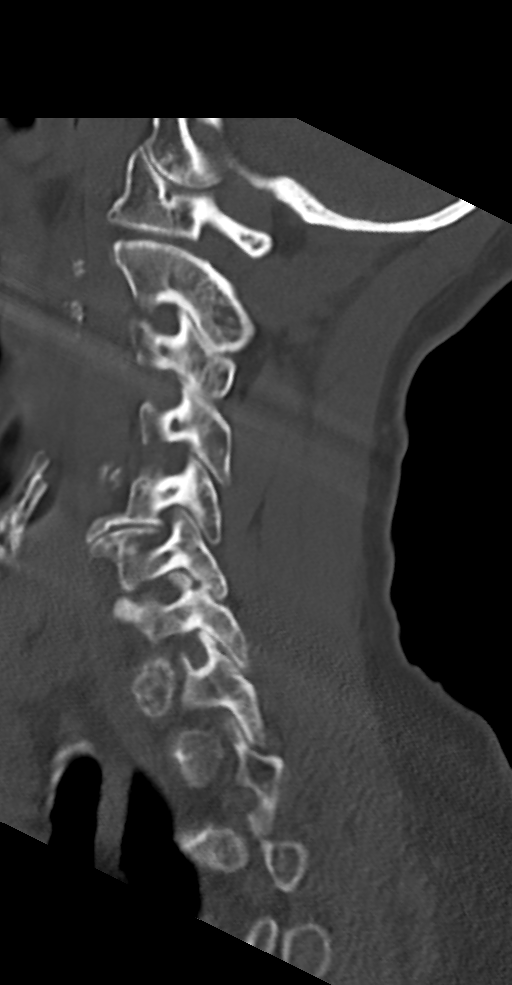
[im 22/53  bone]
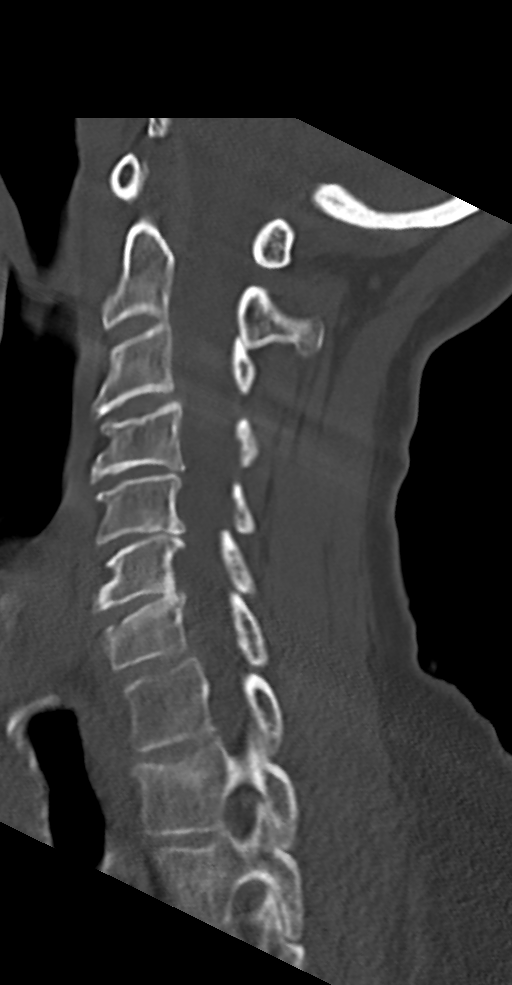
[im 27/53  soft-tissue]
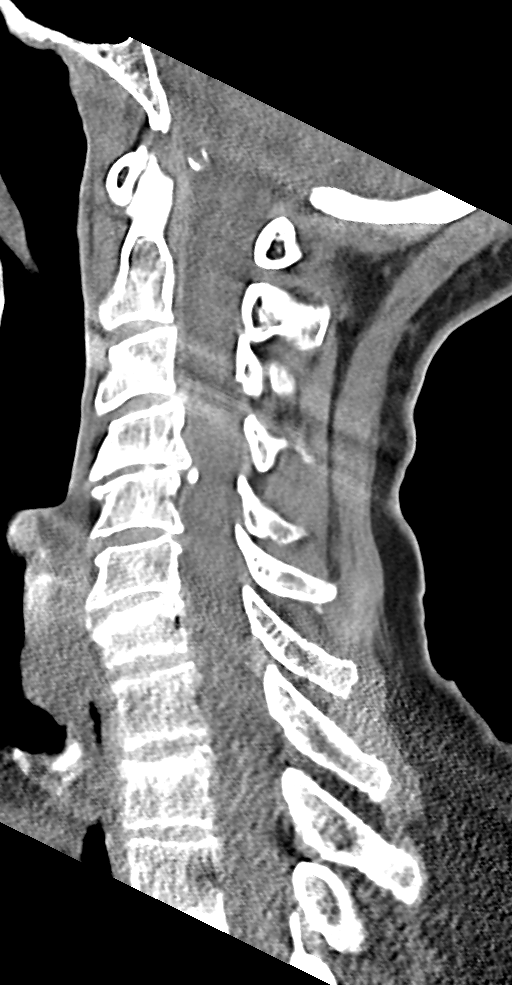
[im 27/53  bone]
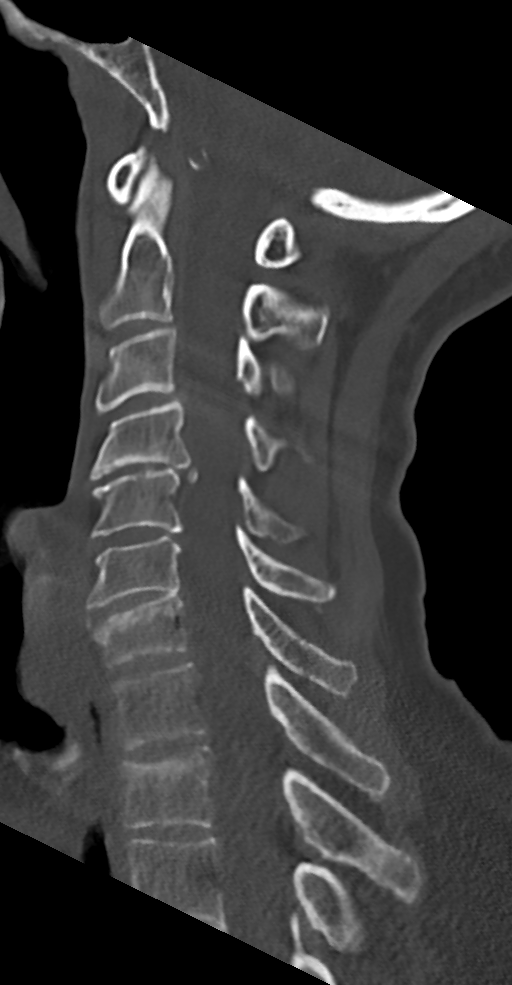
[im 31/53  bone]
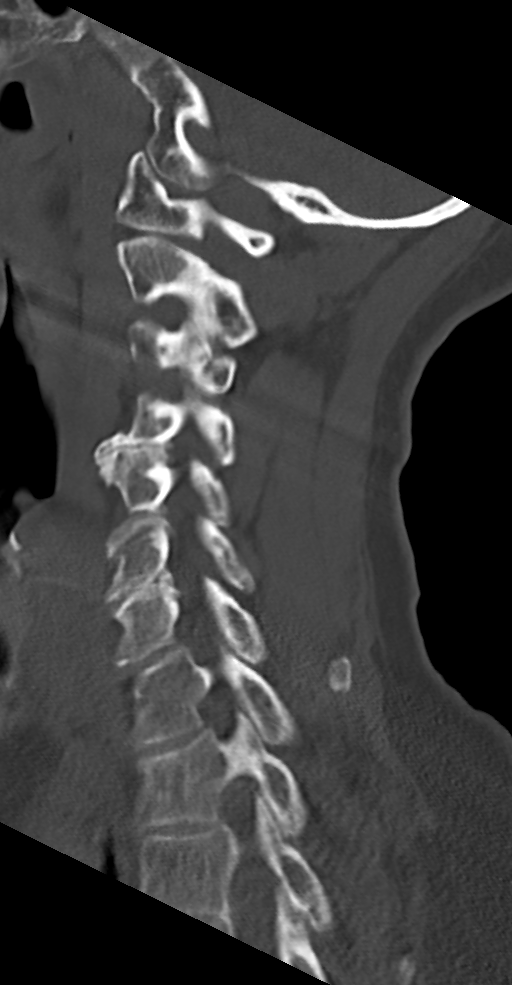
[im 35/53  bone]
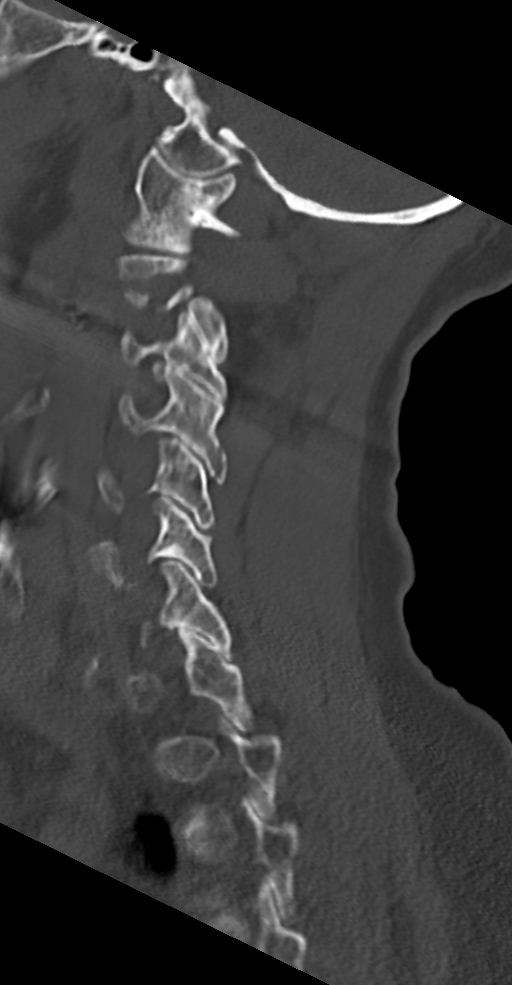

[Series 10: cor bone · coronal · 0.23mm/px · 3 of 52 slices shown]
[im 11/52  bone]
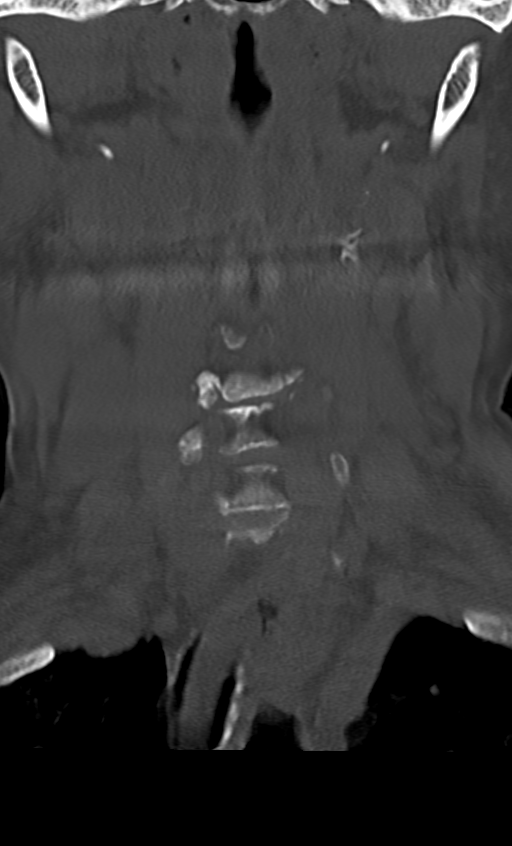
[im 21/52  bone]
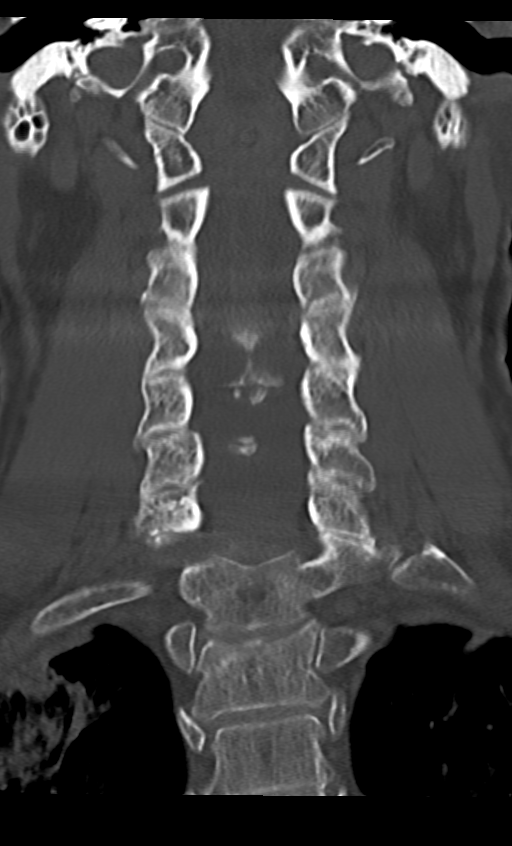
[im 31/52  bone]
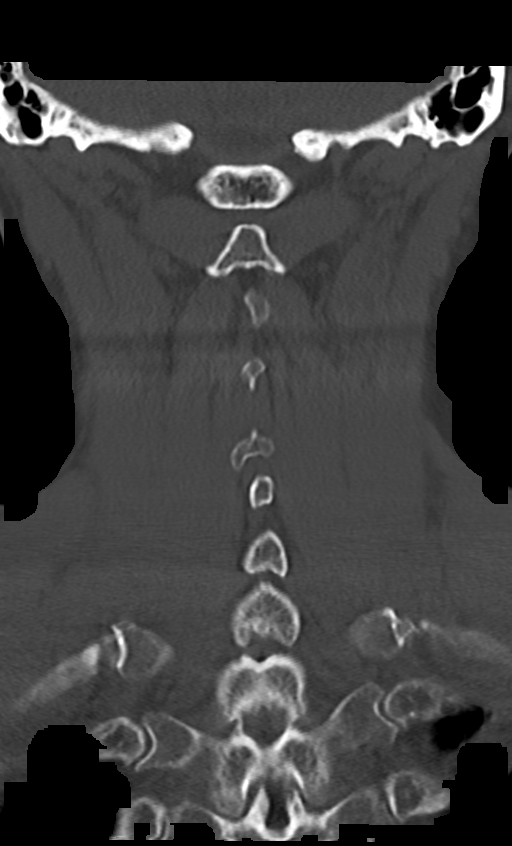

[9 of 33 positions shown; findings below may reference images not displayed]

FINDINGS: CT HEAD FINDINGS

Brain: There is stable age related volume loss. Stable mild
invagination of CSF into the sella. There is no intracranial mass,
hemorrhage, extra-axial fluid collection, or midline shift. There is
patchy small vessel disease in the centra semiovale bilaterally,
stable. There is small vessel disease in the anterior limbs of each
internal and external capsule. No evident acute infarct.

Vascular: No appreciable hyperdense vessels. There is calcification
in the distal vertebral arteries and carotid siphon regions
bilaterally.

Skull: The bony calvarium appears intact.

Sinuses/Orbits: There is mild mucosal thickening in several ethmoid
air cells. Other visualized paranasal sinuses are clear. Orbits
appear symmetric bilaterally.

Other: Mastoid air cells are clear.

CT CERVICAL SPINE FINDINGS

Alignment: There is no appreciable spondylolisthesis.

Skull base and vertebrae: Skull base and craniocervical junction
regions appear normal. There is anterior wedging of the C7 vertebral
body which was not present previously and appears potentially acute.
There is no retropulsion of bone in this area. There is no other
evidence suggesting fracture. No blastic or lytic bone lesions.

Soft tissues and spinal canal: Prevertebral soft tissues and
predental space regions are normal. No cord or canal hematoma. No
paraspinous lesions are evident.

Disc levels: There is moderate disc space narrowing at C4-5 with
milder disc space narrowing at C6-7, stable. There is facet
hypertrophy at multiple levels bilaterally. There is impression on
exiting nerve roots at C5-6 on the right and at C6-7 bilaterally. No
frank disc extrusion or stenosis.

Upper chest: There is scarring in each upper lobe, stable

Other: There is calcification in each carotid artery.
IMPRESSION: CT head: Age related volume loss. Patchy supratentorial small vessel
disease, stable. No acute infarct. No mass or hemorrhage.

Foci of arterial vascular calcification noted. Mucosal thickening in
several ethmoid air cells noted.

CT cervical spine:

1. Anterior wedging of the C7 vertebral body, a recent and probably
acute. No retropulsion of bone. No other fracture evident. No
spondylolisthesis.

2. Multilevel osteoarthritic change with exit foraminal narrowing
impressing on exiting nerve roots on the right at C5-6 and C6-7
bilaterally. No frank disc extrusion or stenosis.

3.  Stable upper lobe parenchymal lung scarring.

4.  Carotid artery calcification bilaterally.

Critical Value/emergent results were called by telephone at the time
of interpretation on [DATE] at [DATE] to provider RAHIMJAN
RAHIMJAN, PA, who verbally acknowledged these results.

## 2019-12-18 IMAGING — CT CT CHEST-ABD-PELV W/ CM
2 of 5 series · 13 of 46 positions shown, 15 images · IV contrast (omnipaque)
Comparison: Same day CT chest, [DATE], CT chest abdomen pelvis,
[DATE]

CLINICAL DATA: Trauma

EXAM:
CT CHEST, ABDOMEN, AND PELVIS WITH CONTRAST
TECHNIQUE: Multidetector CT imaging of the chest, abdomen and pelvis was
performed following the standard protocol during bolus
administration of intravenous contrast.
CONTRAST:  100mL OMNIPAQUE IOHEXOL 300 MG/ML  SOLN

[Series 3: cap with · axial · 0.71mm/px · z∈[-501,-6]mm · 10 of 119 slices shown, 12 images]
[im 10/119  soft-tissue]
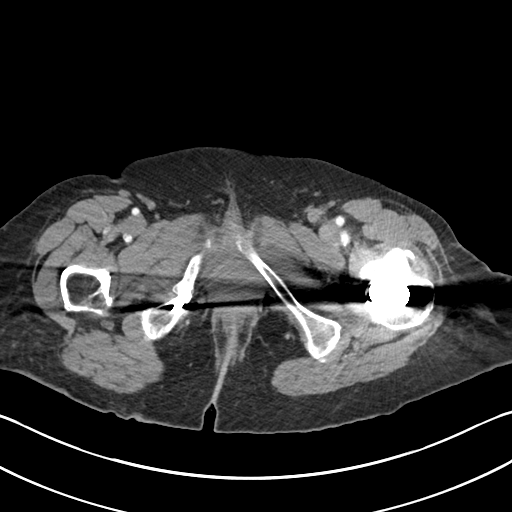
[im 10/119  bone]
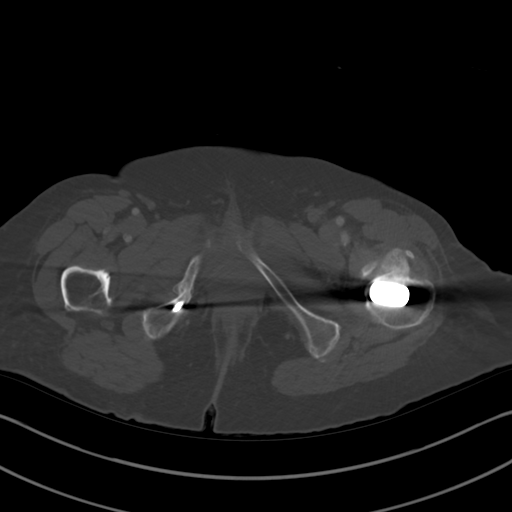
[im 20/119  soft-tissue]
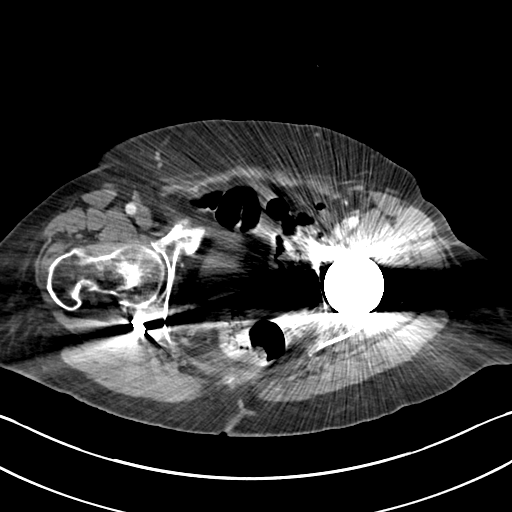
[im 30/119  soft-tissue]
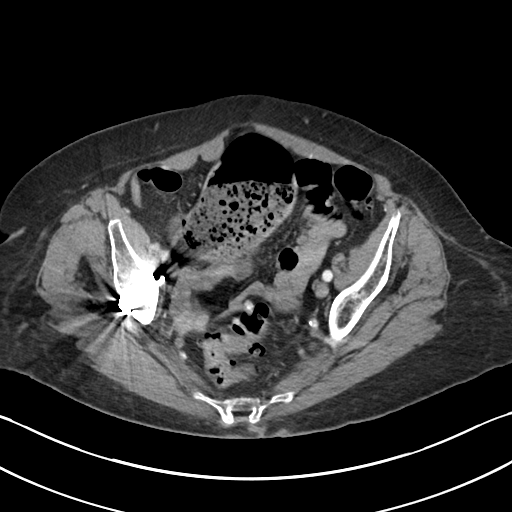
[im 40/119  soft-tissue]
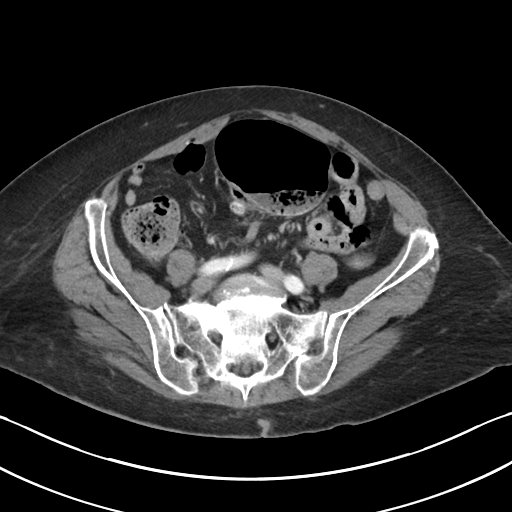
[im 50/119  soft-tissue]
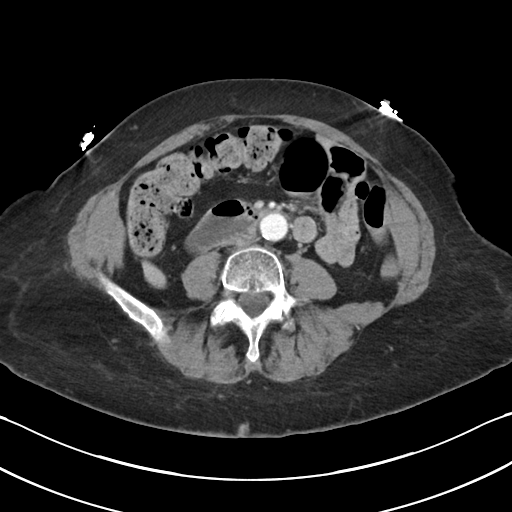
[im 69/119  soft-tissue]
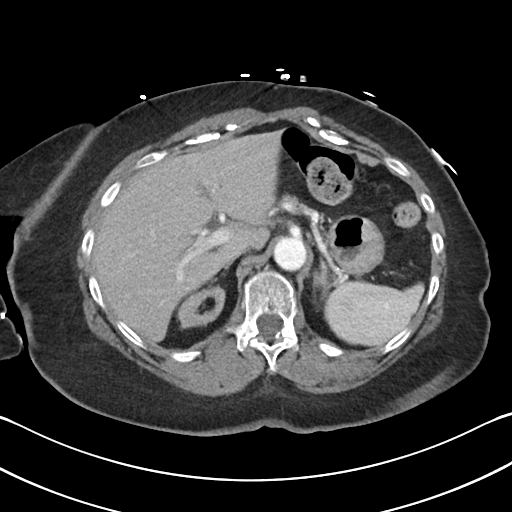
[im 79/119  soft-tissue]
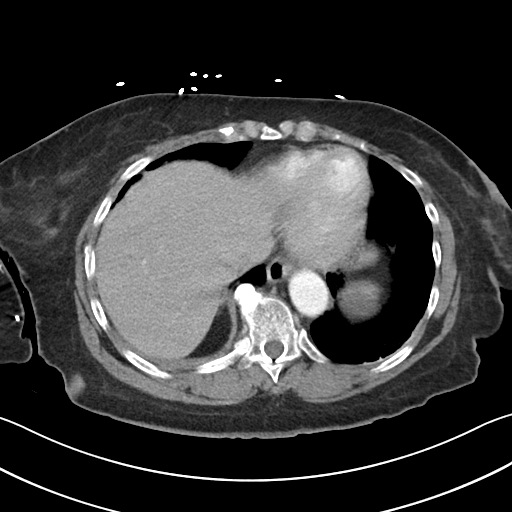
[im 89/119  soft-tissue]
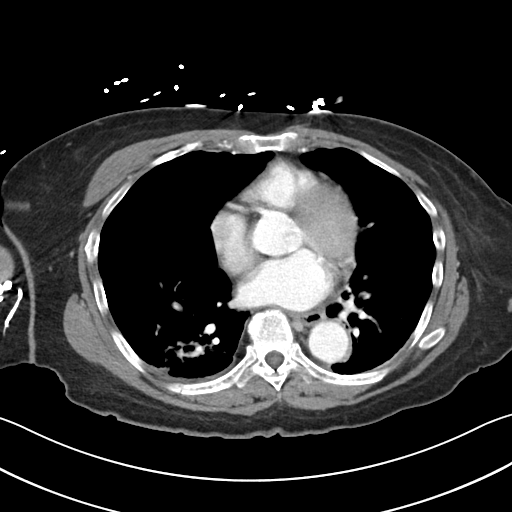
[im 99/119  soft-tissue]
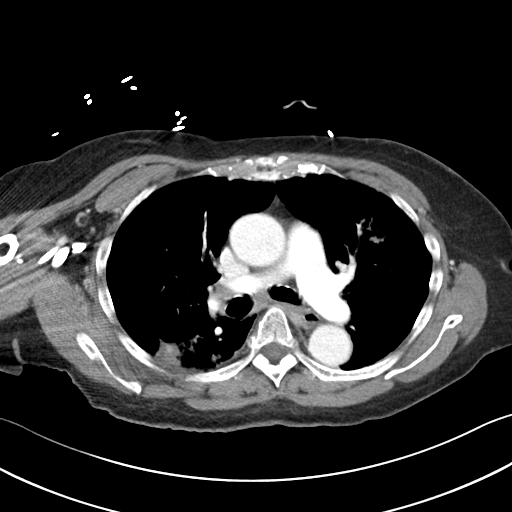
[im 99/119  bone]
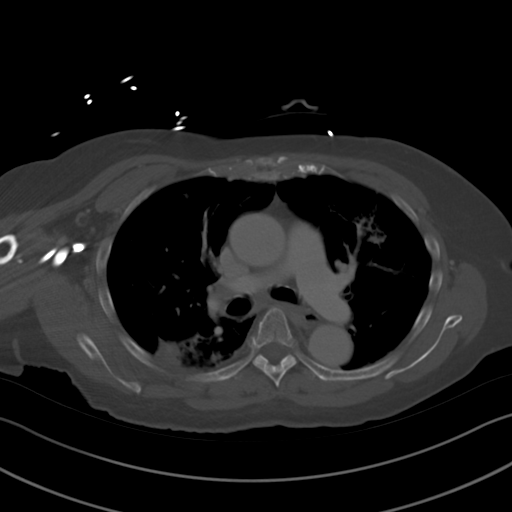
[im 109/119  soft-tissue]
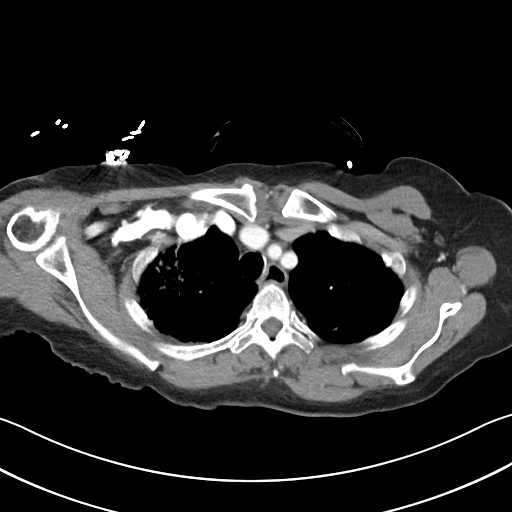

[Series 6: cor · coronal · 0.71mm/px · 3 of 88 slices shown]
[im 30/88  soft-tissue]
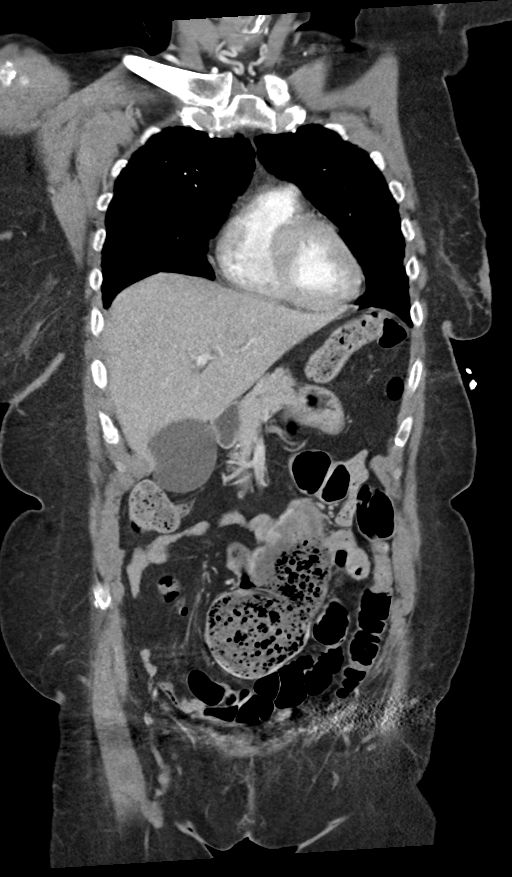
[im 39/88  soft-tissue]
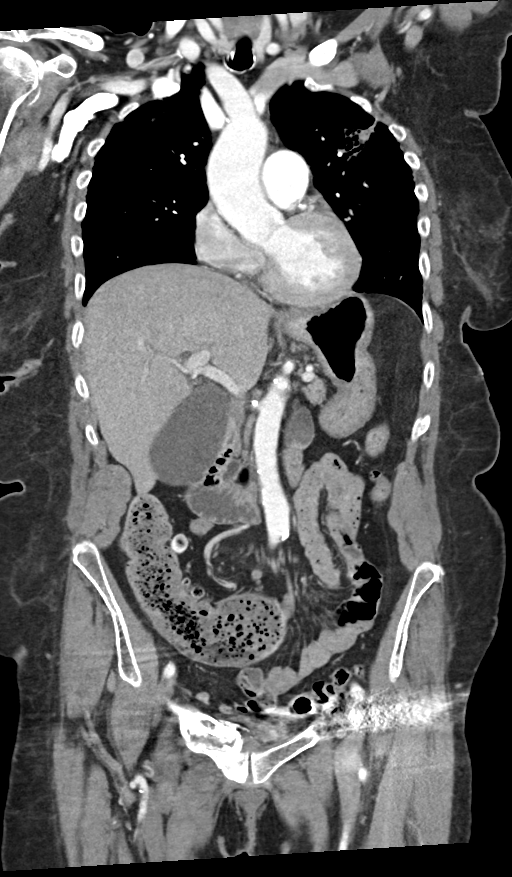
[im 49/88  soft-tissue]
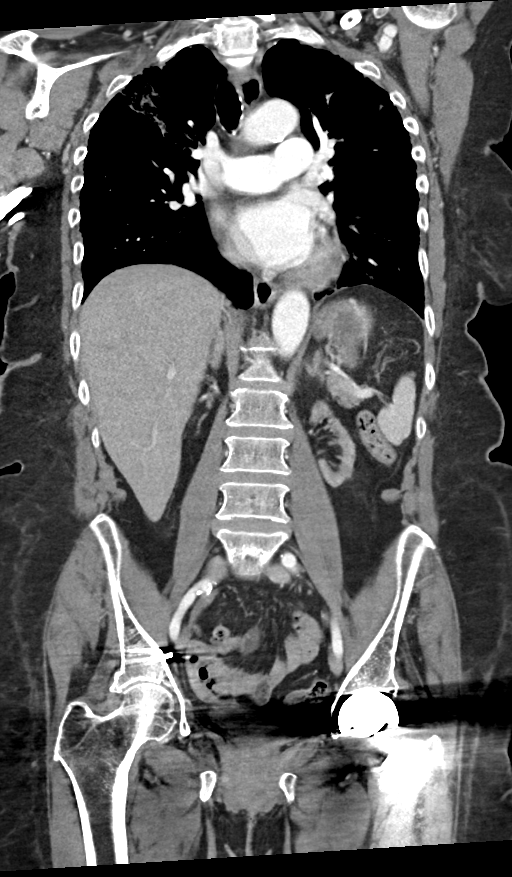

[13 of 46 positions shown; findings below may reference images not displayed]

FINDINGS: CT CHEST FINDINGS

Cardiovascular: Aortic atherosclerosis. As noted on prior
examination, there is new enlargement of the tubular ascending
thoracic aorta, measuring 4.0 x 3.9 cm, however without evidence of
dissection or other acute pathology. Normal heart size. Three-vessel
coronary artery calcification. No pericardial effusion.

Mediastinum/Nodes: No enlarged mediastinal, hilar, or axillary lymph
nodes. Thyroid gland, trachea, and esophagus demonstrate no
significant findings.

Lungs/Pleura: Trace right pleural effusion. Redemonstrated pattern
of pulmonary fibrosis, described in detail on prior examination
although featuring upper lobe predominant peribronchovascular areas
of fibrotic consolidation, architectural distortion, bronchiectasis,
bronchiolectasis. Mild centrilobular emphysema.

Musculoskeletal: No chest wall mass or suspicious bone lesions
identified. Redemonstrated minimally displaced fractures of the
posterior right seventh through eleventh ribs, and additionally
minimally fractures of the lateral right sixth through eighth ribs,
not previously noted.

CT ABDOMEN PELVIS FINDINGS

Hepatobiliary: No solid liver abnormality is seen. Distended
gallbladder. Mild biliary ductal dilatation to the ampulla, CBD
measuring up to 9 mm in caliber. No obstructing calculi identified.
This appearance is similar to prior examination dated [DATE].

Pancreas: Unremarkable. No pancreatic ductal dilatation or
surrounding inflammatory changes.

Spleen: Redemonstrated incidental simple cyst of the superior spleen
measuring 5.3 cm.

Adrenals/Urinary Tract: Adrenal glands are unremarkable. Atrophic
kidneys. No hydronephrosis. Bladder is unremarkable.

Stomach/Bowel: Stomach is within normal limits. Appendix appears
normal. No evidence of bowel wall thickening, distention, or
inflammatory changes. Sigmoid diverticulosis.

Vascular/Lymphatic: Aortic atherosclerosis. No enlarged abdominal or
pelvic lymph nodes.

Reproductive: Calcified uterine fibroids.

Other: No abdominal wall hernia or abnormality. No abdominopelvic
ascites.

Musculoskeletal: Plate and screw fixation of the posterior right
acetabulum. Status post left hip total arthroplasty. There are new,
subacute to chronic fracture deformities of the right pubis (series
6, image 39). There are new sclerotic although nonacute fracture
deformities of the right hemi sacrum (series 3, image 80). Focally
very severe arthrosis of L5-S1.
IMPRESSION: 1. As noted on prior examination, there is new enlargement of the
tubular ascending thoracic aorta, measuring 4.0 x 3.9 cm, however
without evidence of dissection or other acute pathology on this
contrast enhanced examination. Recommend annual imaging followup by
CTA or MRA. This recommendation follows [ZE]
ACCF/AHA/AATS/ACR/ASA/SCA/FERIENHAUS/FERIENHAUS/FERIENHAUS/FERIENHAUS Guidelines for the
Diagnosis and Management of Patients with Thoracic Aortic Disease.
Circulation. [ZE]; 121: [id number]. Aortic aneurysm NOS
([ZE]-[ZE]).
2. Multiple redemonstrated right-sided rib fractures with associated
trace pleural effusions.
3. New, subacute to chronic fracture deformities of the right pubis.
There are new sclerotic although nonacute fracture deformities of
the right hemisacrum. These fractures were not noted on prior
examination dated [DATE].
[DATE]. No CT findings of acute traumatic injury to the organs of the
chest, abdomen, or pelvis.
5. Redemonstrated unusual pattern of pulmonary fibrosis, described
in detail on same day prior examination of the chest. Please see
prior examination for discussion.
6. Renal atrophy, in keeping with dialysis dependent ESRD.
7. Coronary artery disease. Aortic Atherosclerosis ([ZE]-[ZE]).

## 2019-12-18 IMAGING — CT CT HEAD W/O CM
3 of 4 series · 13 of 47 positions shown, 15 images · non-contrast
Comparison: CT head and CT cervical spine [DATE]

CLINICAL DATA: Pain following fall

EXAM:
CT HEAD WITHOUT CONTRAST
CT CERVICAL SPINE WITHOUT CONTRAST
TECHNIQUE: Multidetector CT imaging of the head and cervical spine was
performed following the standard protocol without intravenous
contrast. Multiplanar CT image reconstructions of the cervical spine
were also generated.

[Series 3: head wo · axial · 0.39mm/px · z∈[-134,-24]mm · 7 of 30 slices shown, 9 images]
[im 4/30  brain]
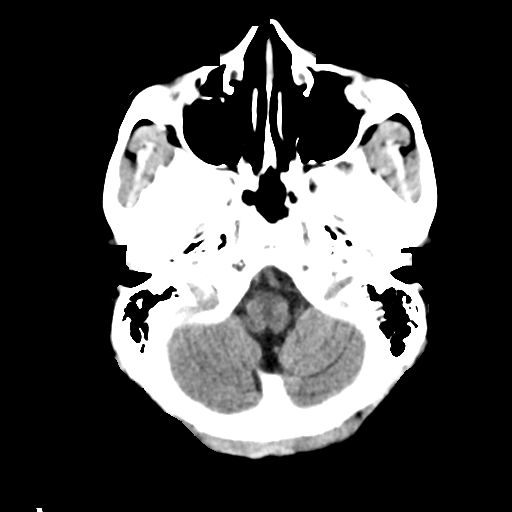
[im 4/30  bone]
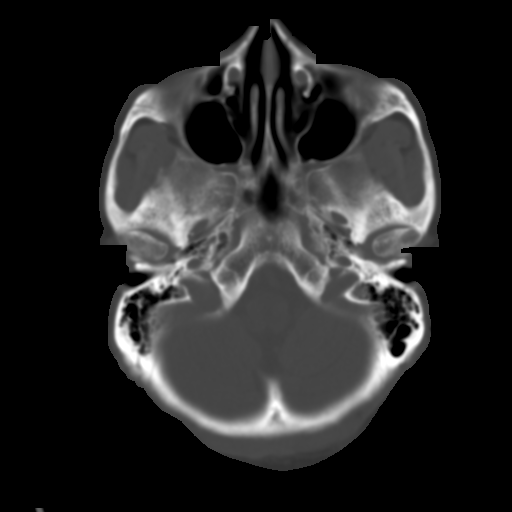
[im 8/30  brain]
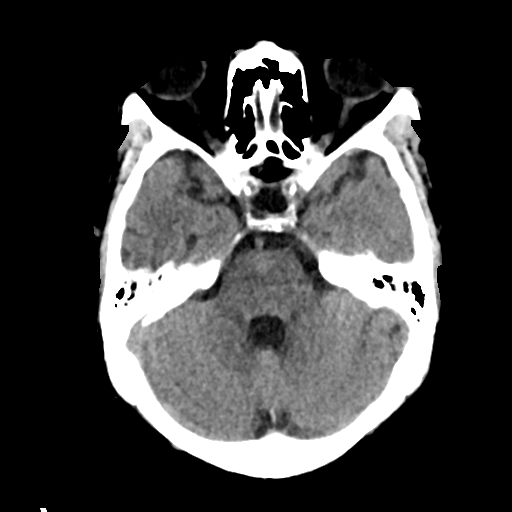
[im 11/30  brain]
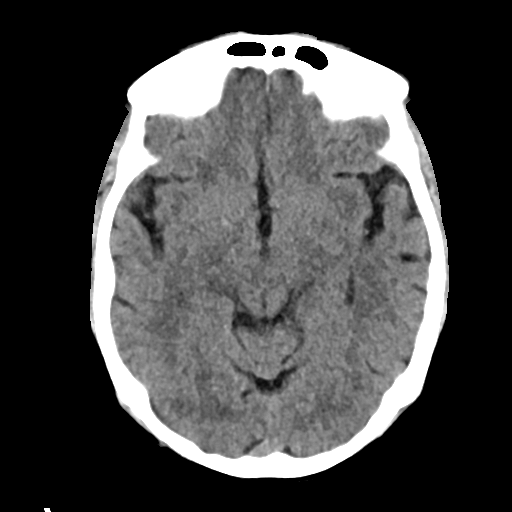
[im 15/30  brain]
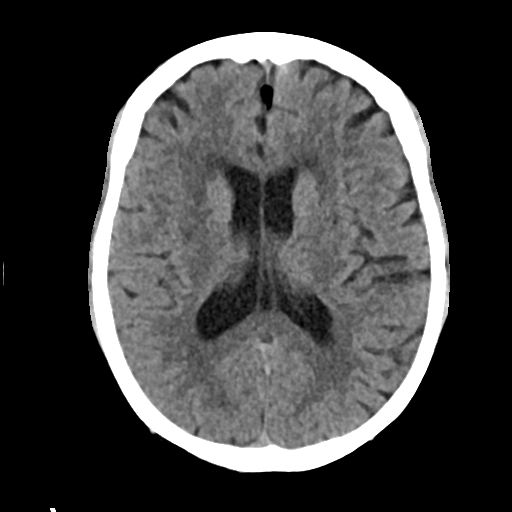
[im 19/30  brain]
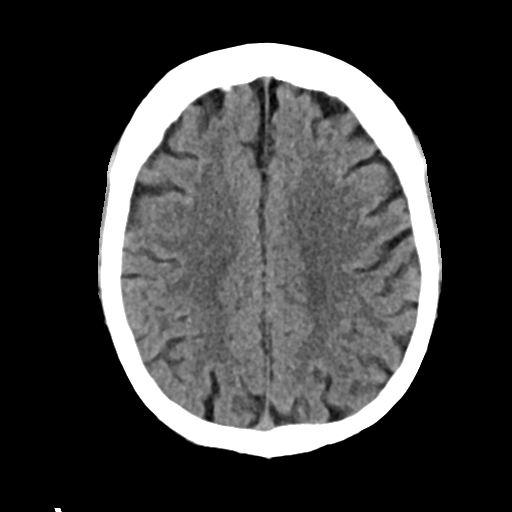
[im 19/30  bone]
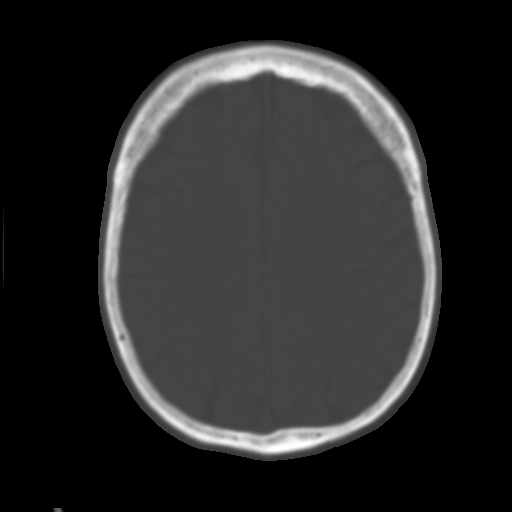
[im 22/30  brain]
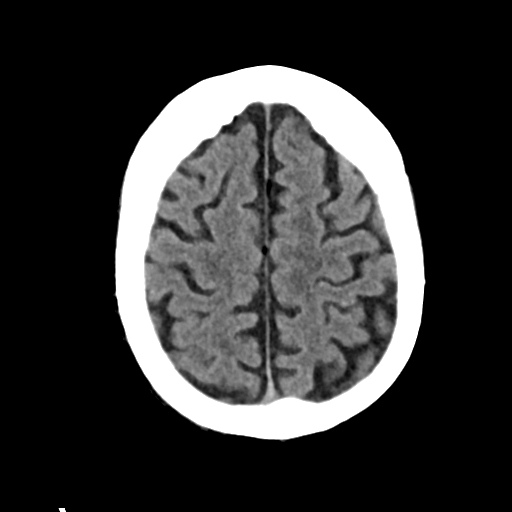
[im 26/30  brain]
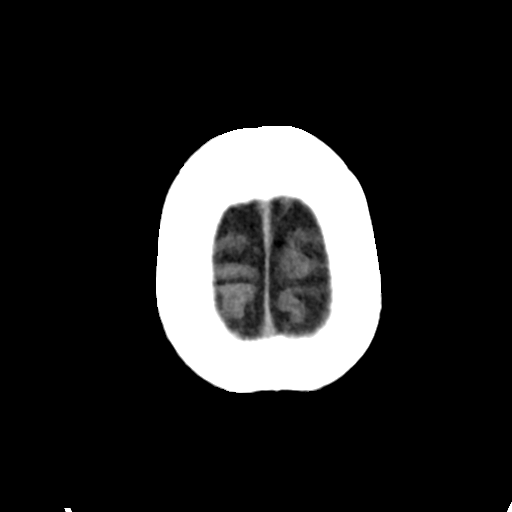

[Series 5: cor soft · coronal · 0.28mm/px · 3 of 64 slices shown]
[im 22/64  brain]
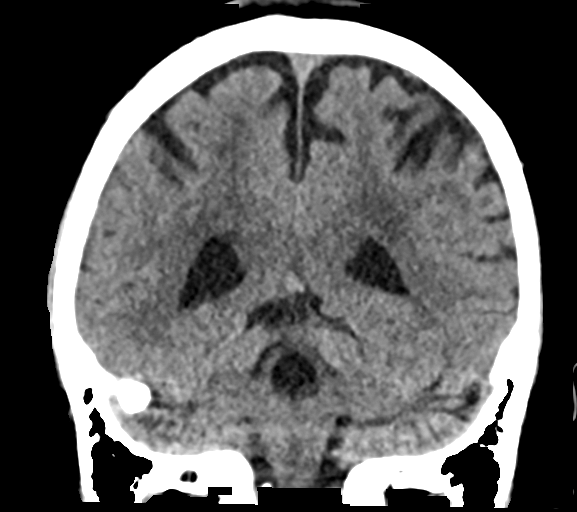
[im 29/64  brain]
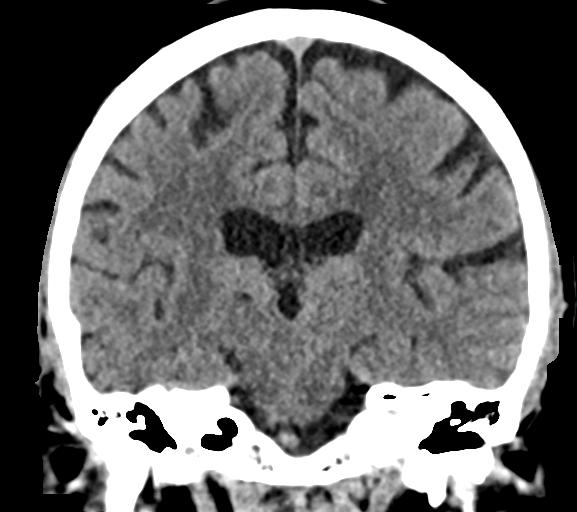
[im 36/64  brain]
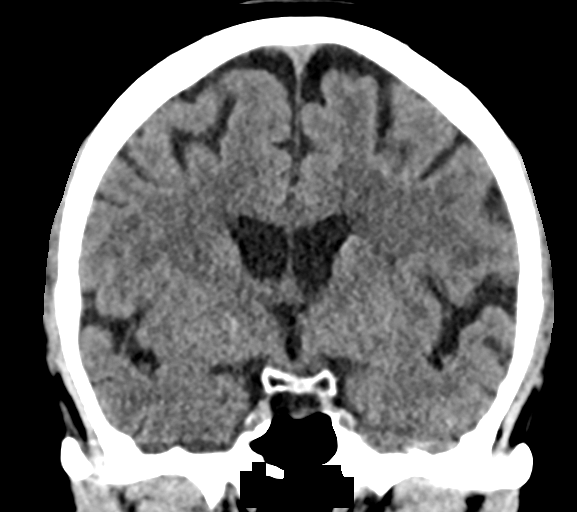

[Series 6: sag soft · sagittal · 0.28mm/px · 3 of 54 slices shown]
[im 18/54  brain]
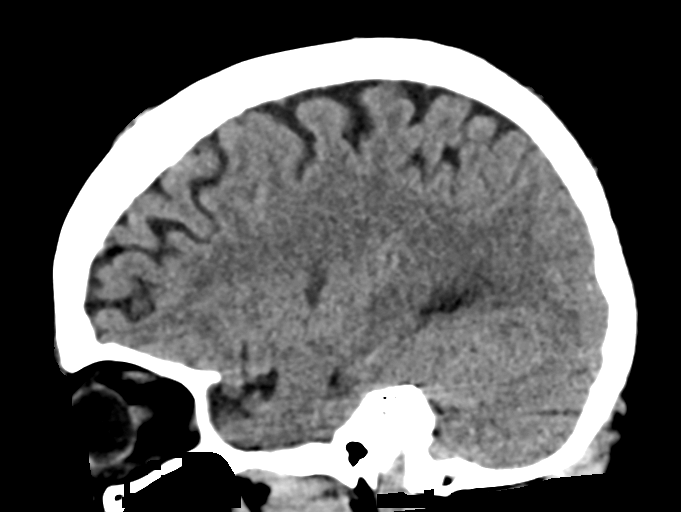
[im 27/54  brain]
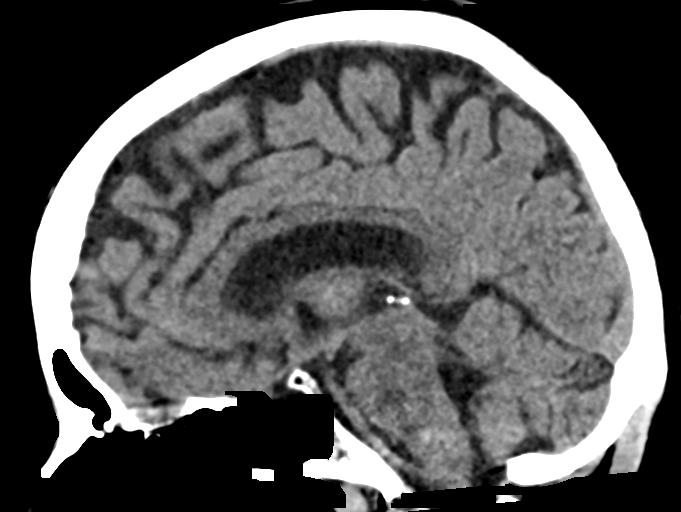
[im 36/54  brain]
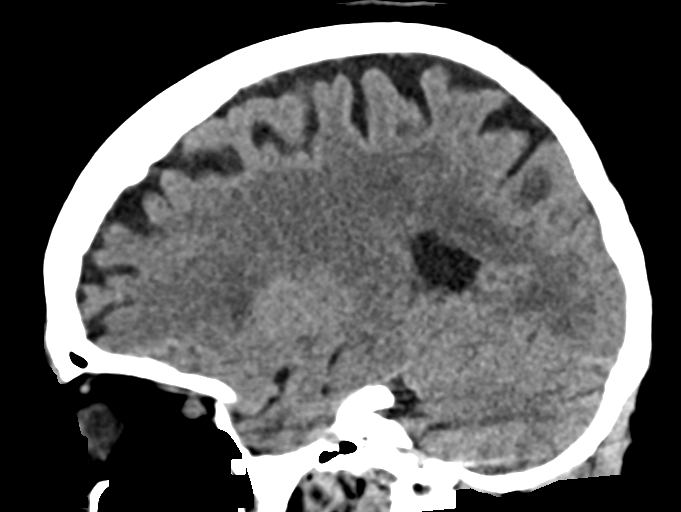

[13 of 47 positions shown; findings below may reference images not displayed]

FINDINGS: CT HEAD FINDINGS

Brain: There is stable age related volume loss. Stable mild
invagination of CSF into the sella. There is no intracranial mass,
hemorrhage, extra-axial fluid collection, or midline shift. There is
patchy small vessel disease in the centra semiovale bilaterally,
stable. There is small vessel disease in the anterior limbs of each
internal and external capsule. No evident acute infarct.

Vascular: No appreciable hyperdense vessels. There is calcification
in the distal vertebral arteries and carotid siphon regions
bilaterally.

Skull: The bony calvarium appears intact.

Sinuses/Orbits: There is mild mucosal thickening in several ethmoid
air cells. Other visualized paranasal sinuses are clear. Orbits
appear symmetric bilaterally.

Other: Mastoid air cells are clear.

CT CERVICAL SPINE FINDINGS

Alignment: There is no appreciable spondylolisthesis.

Skull base and vertebrae: Skull base and craniocervical junction
regions appear normal. There is anterior wedging of the C7 vertebral
body which was not present previously and appears potentially acute.
There is no retropulsion of bone in this area. There is no other
evidence suggesting fracture. No blastic or lytic bone lesions.

Soft tissues and spinal canal: Prevertebral soft tissues and
predental space regions are normal. No cord or canal hematoma. No
paraspinous lesions are evident.

Disc levels: There is moderate disc space narrowing at C4-5 with
milder disc space narrowing at C6-7, stable. There is facet
hypertrophy at multiple levels bilaterally. There is impression on
exiting nerve roots at C5-6 on the right and at C6-7 bilaterally. No
frank disc extrusion or stenosis.

Upper chest: There is scarring in each upper lobe, stable

Other: There is calcification in each carotid artery.
IMPRESSION: CT head: Age related volume loss. Patchy supratentorial small vessel
disease, stable. No acute infarct. No mass or hemorrhage.

Foci of arterial vascular calcification noted. Mucosal thickening in
several ethmoid air cells noted.

CT cervical spine:

1. Anterior wedging of the C7 vertebral body, a recent and probably
acute. No retropulsion of bone. No other fracture evident. No
spondylolisthesis.

2. Multilevel osteoarthritic change with exit foraminal narrowing
impressing on exiting nerve roots on the right at C5-6 and C6-7
bilaterally. No frank disc extrusion or stenosis.

3.  Stable upper lobe parenchymal lung scarring.

4.  Carotid artery calcification bilaterally.

Critical Value/emergent results were called by telephone at the time
of interpretation on [DATE] at [DATE] to provider RAHIMJAN
RAHIMJAN, PA, who verbally acknowledged these results.

## 2019-12-18 MED ORDER — HYDROMORPHONE HCL 1 MG/ML IJ SOLN
0.5000 mg | INTRAMUSCULAR | Status: DC | PRN
  Administered 2019-12-18 – 2019-12-19 (×4): 0.5 mg via INTRAVENOUS
  Filled 2019-12-18 (×4): qty 1

## 2019-12-18 MED ORDER — ACETAMINOPHEN 325 MG PO TABS: 650.0000 mg | ORAL_TABLET | Freq: Four times a day (QID) | ORAL | Status: DC | PRN

## 2019-12-18 MED ORDER — METHOCARBAMOL 500 MG PO TABS
500.0000 mg | ORAL_TABLET | Freq: Four times a day (QID) | ORAL | Status: DC | PRN
  Administered 2019-12-19 – 2019-12-25 (×11): 500 mg via ORAL
  Filled 2019-12-18 (×11): qty 1

## 2019-12-18 MED ORDER — ACETAMINOPHEN 325 MG PO TABS
650.0000 mg | ORAL_TABLET | Freq: Three times a day (TID) | ORAL | Status: DC
  Administered 2019-12-19 – 2019-12-25 (×17): 650 mg via ORAL
  Filled 2019-12-18 (×18): qty 2

## 2019-12-18 MED ORDER — ACETAMINOPHEN 325 MG PO TABS: 650.0000 mg | ORAL_TABLET | Freq: Four times a day (QID) | ORAL | Status: DC

## 2019-12-18 MED ORDER — ACETAMINOPHEN 325 MG PO TABS
650.0000 mg | ORAL_TABLET | Freq: Once | ORAL | Status: AC
  Administered 2019-12-18: 650 mg via ORAL
  Filled 2019-12-18: qty 2

## 2019-12-18 MED ORDER — IOHEXOL 300 MG/ML  SOLN
100.0000 mL | Freq: Once | INTRAMUSCULAR | Status: AC | PRN
  Administered 2019-12-18: 100 mL via INTRAVENOUS

## 2019-12-18 MED ORDER — ACETAMINOPHEN 650 MG RE SUPP: 650.0000 mg | Freq: Four times a day (QID) | RECTAL | Status: DC | PRN

## 2019-12-18 MED ORDER — RAMELTEON 8 MG PO TABS
8.0000 mg | ORAL_TABLET | Freq: Every day | ORAL | Status: DC
  Administered 2019-12-18 – 2019-12-25 (×8): 8 mg via ORAL
  Filled 2019-12-18 (×10): qty 1

## 2019-12-18 MED ORDER — MORPHINE SULFATE (PF) 2 MG/ML IV SOLN: 1.0000 mg | INTRAVENOUS | Status: DC | PRN

## 2019-12-18 MED ORDER — SENNOSIDES-DOCUSATE SODIUM 8.6-50 MG PO TABS
1.0000 | ORAL_TABLET | Freq: Every evening | ORAL | Status: DC | PRN
  Administered 2019-12-22: 1 via ORAL
  Filled 2019-12-18: qty 1

## 2019-12-18 MED ORDER — ACETAMINOPHEN 650 MG RE SUPP: 650.0000 mg | Freq: Three times a day (TID) | RECTAL | Status: DC

## 2019-12-18 MED ORDER — LIDOCAINE 5 % EX PTCH
1.0000 | MEDICATED_PATCH | CUTANEOUS | Status: DC
  Administered 2019-12-18 – 2019-12-26 (×8): 1 via TRANSDERMAL
  Filled 2019-12-18 (×9): qty 1

## 2019-12-18 MED ORDER — GABAPENTIN 300 MG PO CAPS
300.0000 mg | ORAL_CAPSULE | Freq: Three times a day (TID) | ORAL | Status: DC
  Administered 2019-12-18 – 2019-12-19 (×2): 300 mg via ORAL
  Filled 2019-12-18 (×2): qty 1

## 2019-12-18 MED ORDER — IBUPROFEN 800 MG PO TABS
800.0000 mg | ORAL_TABLET | Freq: Three times a day (TID) | ORAL | Status: DC
  Filled 2019-12-18: qty 1

## 2019-12-18 MED ORDER — MORPHINE SULFATE (PF) 4 MG/ML IV SOLN
4.0000 mg | Freq: Once | INTRAVENOUS | Status: AC
  Administered 2019-12-18: 4 mg via INTRAVENOUS
  Filled 2019-12-18: qty 1

## 2019-12-18 MED ORDER — HYDROMORPHONE HCL 1 MG/ML IJ SOLN
0.5000 mg | INTRAMUSCULAR | Status: DC | PRN
  Administered 2019-12-18: 0.5 mg via INTRAVENOUS
  Filled 2019-12-18: qty 1

## 2019-12-18 MED ORDER — CYCLOBENZAPRINE HCL 10 MG PO TABS
5.0000 mg | ORAL_TABLET | Freq: Once | ORAL | Status: AC
  Administered 2019-12-18: 5 mg via ORAL
  Filled 2019-12-18: qty 1

## 2019-12-18 MED ORDER — OXYCODONE HCL 5 MG PO TABS
5.0000 mg | ORAL_TABLET | ORAL | Status: DC | PRN
  Administered 2019-12-18 – 2019-12-19 (×2): 5 mg via ORAL
  Filled 2019-12-18 (×2): qty 1

## 2019-12-18 NOTE — ED Provider Notes (Addendum)
[facility] EMERGENCY DEPARTMENT Provider Note   CSN: [id number] Arrival date & time: 12/17/19  1927     History Chief Complaint  Patient presents with  . Fall    Bianca Myers is a 74 y.o. female.  Patient arrives with right-sided rib pain after fall on Saturday afternoon.  Patient had just gotten home from dialysis got up out of the car to use her walker and fell over on her right side landing on the ground.  Does not think she hit her head.  She is not on blood thinners.  As the day has progressed she has not been able to move or take a deep breath without severe pain.  She has been in the waiting room for 12 hours.  Feels extremely uncomfortable and any type of movement makes right side of her chest hurt.  The history is provided by the patient.  Fall This is a new problem. The current episode started yesterday. Associated symptoms include chest pain. Pertinent negatives include no abdominal pain and no shortness of breath. Exacerbated by: movement. Nothing relieves the symptoms. She has tried nothing for the symptoms. The treatment provided no relief.       Past Medical History:  Diagnosis Date  . Anemia   . Arthritis   . Chronic kidney disease (CKD) stage G5/A1, glomerular filtration rate (GFR) less than or equal to 15 mL/min/1.73 square meter and albuminuria creatinine ratio less than 30 mg/g (HCC)    Dialysis M/W/F  . Depression   . Diabetes mellitus ([place])   . Gout   . History of anemia due to chronic kidney disease   . History of degenerative disc disease   . Hypertension   . Pneumonia   . Secondary hyperparathyroidism ([place])   . Spondylisthesis     Patient Active Problem List   Diagnosis Date Noted  . Left displaced femoral neck fracture ([place])   . Syncope 06/07/2019  . MSSA bacteremia 03/10/2019  . Diabetes mellitus ([place]) 03/10/2019  . 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  . Mild aortic stenosis 03/10/2019  . HCAP (healthcare-associated  pneumonia) 03/09/2019  . Heart failure ([place]) 08/31/2018  . Anemia of chronic kidney failure, stage 5 ([place]) 08/27/2018  . Chronic depression 08/27/2018  . Patient is Jehovah's Witness 08/27/2018  . Essential hypertension   . End-stage renal disease on hemodialysis ([place]) 12/10/2015    Past Surgical History:  Procedure Laterality Date  . A/V FISTULAGRAM Left 12/16/2018   Procedure: A/V FISTULAGRAM;  Surgeon: Marty Heck, MD;  Location: [facility];  Service: Cardiovascular;  Laterality: Left;  . A/V FISTULAGRAM Left 06/13/2019   Procedure: A/V FISTULAGRAM;  Surgeon: Marty Heck, MD;  Location: [facility];  Service: Cardiovascular;  Laterality: Left;  . AV FISTULA PLACEMENT Left 08/24/2018   Procedure: ARTERIOVENOUS (AV) FISTULA CREATION;  Surgeon: Waynetta Sandy, MD;  Location: [facility];  Service: Vascular;  Laterality: Left;  . AV FISTULA PLACEMENT Left 07/12/2019   Procedure: INSERTION OF LEFT UPPER ARM  ARTERIOVENOUS (AV) GORE-TEX GRAFT;  Surgeon: Elam Dutch, MD;  Location: [facility];  Service: Vascular;  Laterality: Left;  . BASCILIC VEIN TRANSPOSITION Left 11/04/2018   Procedure: BASCILIC VEIN TRANSPOSITION SECOND STAGE;  Surgeon: Waynetta Sandy, MD;  Location: [facility];  Service: Vascular;  Laterality: Left;  . COLONOSCOPY    . HIP SURGERY    . INSERTION OF DIALYSIS CATHETER N/A 06/15/2019   Procedure: INSERTION OF TUNNELED  DIALYSIS CATHETER RIGHT  INTERNAL JUGULAR;  Surgeon: Serafina Mitchell, MD;  Location: [facility] OR;  Service: Vascular;  Laterality: N/A;  . IR FLUORO GUIDE CV LINE RIGHT  08/21/2018  . IR FLUORO GUIDE CV LINE RIGHT  06/08/2019  . IR REMOVAL TUN CV CATH W/O FL  03/11/2019  . IR US GUIDE VASC ACCESS RIGHT  08/21/2018  . IR US GUIDE VASC ACCESS RIGHT  06/08/2019  . PERIPHERAL VASCULAR BALLOON ANGIOPLASTY Left 06/13/2019   Procedure: PERIPHERAL VASCULAR BALLOON ANGIOPLASTY;  Surgeon: Marty Heck, MD;  Location: [facility];  Service: Cardiovascular;  Laterality: Left;  AVF  . PERIPHERAL VASCULAR INTERVENTION Left 12/16/2018   Procedure: PERIPHERAL VASCULAR INTERVENTION;  Surgeon: Marty Heck, MD;  Location: [facility];  Service: Cardiovascular;  Laterality: Left;  arm fistula  . TOTAL HIP ARTHROPLASTY Left 06/10/2019   Procedure: TOTAL HIP ARTHROPLASTY ANTERIOR APPROACH;  Surgeon: Leandrew Koyanagi, MD;  Location: [facility];  Service: Orthopedics;  Laterality: Left;     OB History   No obstetric history on file.     Family History  Family history unknown: Yes    Social History   Tobacco Use  . Smoking status: Former Smoker    Quit date: 1973    Years since quitting: 48.8  . Smokeless tobacco: Never Used  Vaping Use  . Vaping Use: Never used  Substance Use Topics  . Alcohol use: Not Currently  . Drug use: Never    Home Medications Prior to Admission medications   Medication Sig Start Date End Date Taking? Authorizing Provider  acetaminophen (TYLENOL) 650 MG CR tablet Take 1,300 mg by mouth every 8 (eight) hours as needed for pain.    [provider]  amLODipine (NORVASC) 10 MG tablet Take 1 tablet (10 mg total) by mouth daily. 06/17/19   Thurnell Lose, MD  Ascorbic Acid (VITAMIN C) 1000 MG tablet Take 1,000 mg by mouth 2 (two) times a day.     [provider]  atorvastatin (LIPITOR) 10 MG tablet Take 10 mg by mouth at bedtime. 05/14/18   [provider]  Cholecalciferol 100 MCG (4000 UT) CAPS Take 4,000 Units by mouth daily.    [provider]  enoxaparin (LOVENOX) 30 MG/0.3ML injection Inject 0.3 mLs (30 mg total) into the skin daily. 06/10/19   Leandrew Koyanagi, MD  escitalopram (LEXAPRO) 10 MG tablet Take 10 mg by mouth daily. 05/09/19   [provider]  ferric citrate (AURYXIA) 1 GM 210 MG(Fe) tablet Take 420 mg by mouth 3 (three) times daily with meals.    [provider]  furosemide (LASIX) 40 MG tablet Take 1 tablet (40 mg total)  by mouth every Tuesday, Thursday, Saturday, and Sunday. 09/04/18   Nita Sells, MD  labetalol (NORMODYNE) 200 MG tablet Take 200 mg by mouth 2 (two) times daily.    [provider]  lidocaine-prilocaine (EMLA) cream Apply 1 application topically as directed. Prior to Dialysis 02/11/19   [provider]  loperamide (IMODIUM) 2 MG capsule Take 4 mg by mouth every 4 (four) hours as needed for diarrhea or loose stools.  03/07/19 03/05/20  [provider]  melatonin 3 MG TABS tablet Take 6 mg by mouth at bedtime.    [provider]  multivitamin (RENA-VIT) TABS tablet Take 1 tablet by mouth at bedtime. 08/27/18   Dhungel, Flonnie Overman, MD  Omega-3 Fatty Acids (FISH OIL) 1000 MG CAPS Take 1,000 mg by mouth 2 (two) times  daily.     [provider]  Oxycodone HCl 10 MG TABS Take 1 tablet (10 mg total) by mouth every 4 (four) hours as needed (pain). 07/12/19   Ulyses Amor, PA-C  polyethylene glycol (MIRALAX) 17 g packet Take 17 g by mouth daily as needed for moderate constipation. Patient taking differently: Take 17 g by mouth 2 (two) times daily as needed for moderate constipation.  08/27/18   Dhungel, Flonnie Overman, MD  senna (SENOKOT) 8.6 MG TABS tablet Take 2 tablets by mouth at bedtime.    [provider]  Turmeric 500 MG CAPS Take 500 mg by mouth daily.     [provider]    Allergies    Patient has no known allergies.  Review of Systems   Review of Systems  Constitutional: Negative for chills and fever.  HENT: Negative for ear pain and sore throat.   Eyes: Negative for pain and visual disturbance.  Respiratory: Negative for cough and shortness of breath.   Cardiovascular: Positive for chest pain. Negative for palpitations.  Gastrointestinal: Negative for abdominal pain and vomiting.  Genitourinary: Negative for dysuria and hematuria.  Musculoskeletal: Negative for arthralgias and back pain.  Skin: Negative for color change and rash.   Neurological: Negative for seizures and syncope.  All other systems reviewed and are negative.   Physical Exam Updated Vital Signs  ED Triage Vitals  Enc Vitals Group     BP 12/17/19 1942 134/85     Pulse Rate 12/17/19 1942 66     Resp 12/17/19 1942 20     Temp 12/17/19 1942 98.7 F (37.1 C)     Temp Source 12/17/19 1942 Oral     SpO2 12/17/19 1942 100 %     Weight 12/17/19 1941 165 lb 5.5 oz (75 kg)     Height 12/17/19 1941 5\' 7"  (1.702 m)     Head Circumference --      Peak Flow --      Pain Score 12/17/19 1941 8     Pain Loc --      Pain Edu? --      Excl. in [place]? --      Physical Exam Vitals and nursing note reviewed.  Constitutional:      General: She is in acute distress.     Appearance: She is well-developed.  HENT:     Head: Normocephalic and atraumatic.     Nose: Nose normal.     Mouth/Throat:     Mouth: Mucous membranes are moist.  Eyes:     Extraocular Movements: Extraocular movements intact.     Conjunctiva/sclera: Conjunctivae normal.  Cardiovascular:     Rate and Rhythm: Normal rate and regular rhythm.     Heart sounds: No murmur heard.   Pulmonary:     Effort: No respiratory distress.     Comments: Diminished breath sounds throughout due to poor respiratory effort, extreme pain when patient tries to take breath Abdominal:     Palpations: Abdomen is soft.     Tenderness: There is no abdominal tenderness.  Musculoskeletal:        General: Tenderness present. No deformity. Normal range of motion.     Cervical back: Normal range of motion and neck supple. No tenderness.     Comments: Tenderness to the right side of her chest, no pain with range of motion of major joints  Skin:    General: Skin is warm and dry.  Neurological:     General:  No focal deficit present.     Mental Status: She is alert and oriented to person, place, and time.     Cranial Nerves: No cranial nerve deficit.     ED Results / Procedures / Treatments   Labs (all labs  ordered are listed, but only abnormal results are displayed) Labs Reviewed  CBC WITH DIFFERENTIAL/PLATELET - Abnormal; Notable for the following components:      Result Value   Hemoglobin 10.5 (*)    HCT 33.6 (*)    RDW 17.8 (*)    All other components within normal limits  COMPREHENSIVE METABOLIC PANEL - Abnormal; Notable for the following components:   Sodium 130 (*)    Chloride 88 (*)    Glucose, Bld 181 (*)    BUN 45 (*)    Creatinine, Ser 6.25 (*)    Alkaline Phosphatase 131 (*)    GFR, Estimated 6 (*)    All other components within normal limits  RESPIRATORY PANEL BY RT PCR (FLU A&B, COVID)    EKG None  Radiology DG Ribs Unilateral W/Chest Right  Result Date: 12/17/2019 CLINICAL DATA:  Fall with right-sided chest pain. EXAM: RIGHT RIBS AND CHEST - 3+ VIEW COMPARISON:  Chest radiograph and CT 06/07/2019 FINDINGS: The cardiomediastinal silhouette is unchanged with normal heart size. Aortic atherosclerosis is noted. Bilateral upper lobe scarring and asymmetric right upper lobe volume loss are similar to the prior radiograph. No acute airspace consolidation, edema, sizable pleural effusion, or pneumothorax is identified. There are minimally displaced right lateral sixth through ninth rib fractures. A vascular stent is noted in the left upper arm. IMPRESSION: Minimally displaced right lateral sixth through ninth rib fractures. Electronically Signed   By: Logan Bores M.D.   On: 12/17/2019 20:57   CT Head Wo Contrast  Result Date: 12/18/2019 CLINICAL DATA:  Pain following fall EXAM: CT HEAD WITHOUT CONTRAST CT CERVICAL SPINE WITHOUT CONTRAST TECHNIQUE: Multidetector CT imaging of the head and cervical spine was performed following the standard protocol without intravenous contrast. Multiplanar CT image reconstructions of the cervical spine were also generated. COMPARISON:  CT head and CT cervical spine June 07, 2019 FINDINGS: CT HEAD FINDINGS Brain: There is stable age related volume  loss. Stable mild invagination of CSF into the sella. There is no intracranial mass, hemorrhage, extra-axial fluid collection, or midline shift. There is patchy small vessel disease in the centra semiovale bilaterally, stable. There is small vessel disease in the anterior limbs of each internal and external capsule. No evident acute infarct. Vascular: No appreciable hyperdense vessels. There is calcification in the distal vertebral arteries and carotid siphon regions bilaterally. Skull: The bony calvarium appears intact. Sinuses/Orbits: There is mild mucosal thickening in several ethmoid air cells. Other visualized paranasal sinuses are clear. Orbits appear symmetric bilaterally. Other: Mastoid air cells are clear. CT CERVICAL SPINE FINDINGS Alignment: There is no appreciable spondylolisthesis. Skull base and vertebrae: Skull base and craniocervical junction regions appear normal. There is anterior wedging of the C7 vertebral body which was not present previously and appears potentially acute. There is no retropulsion of bone in this area. There is no other evidence suggesting fracture. No blastic or lytic bone lesions. Soft tissues and spinal canal: Prevertebral soft tissues and predental space regions are normal. No cord or canal hematoma. No paraspinous lesions are evident. Disc levels: There is moderate disc space narrowing at C4-5 with milder disc space narrowing at C6-7, stable. There is facet hypertrophy at multiple levels bilaterally. There is impression on  exiting nerve roots at C5-6 on the right and at C6-7 bilaterally. No frank disc extrusion or stenosis. Upper chest: There is scarring in each upper lobe, stable Other: There is calcification in each carotid artery. IMPRESSION: CT head: Age related volume loss. Patchy supratentorial small vessel disease, stable. No acute infarct. No mass or hemorrhage. Foci of arterial vascular calcification noted. Mucosal thickening in several ethmoid air cells noted. CT  cervical spine: 1. Anterior wedging of the C7 vertebral body, a recent and probably acute. No retropulsion of bone. No other fracture evident. No spondylolisthesis. 2. Multilevel osteoarthritic change with exit foraminal narrowing impressing on exiting nerve roots on the right at C5-6 and C6-7 bilaterally. No frank disc extrusion or stenosis. 3.  Stable upper lobe parenchymal lung scarring. 4.  Carotid artery calcification bilaterally. Critical Value/emergent results were called by telephone at the time of interpretation on 12/18/2019 at 10:12 am to provider Krista Blue, PA, who verbally acknowledged these results. Electronically Signed   By: Lowella Grip III M.D.   On: 12/18/2019 10:14   CT Chest Wo Contrast  Result Date: 12/18/2019 CLINICAL DATA:  Rib fracture suspected, traumatic EXAM: CT CHEST WITHOUT CONTRAST TECHNIQUE: Multidetector CT imaging of the chest was performed following the standard protocol without IV contrast. COMPARISON:  06/07/2019 FINDINGS: Cardiovascular: Aortic atherosclerosis. New enlargement of the tubular ascending thoracic aorta, measuring up to 4.0 x 4.0 cm. Normal heart size. Three-vessel coronary artery calcifications. No pericardial effusion. Mediastinum/Nodes: No enlarged mediastinal, hilar, or axillary lymph nodes. Thyroid gland, trachea, and esophagus demonstrate no significant findings. Lungs/Pleura: There is a redemonstrated pattern of pulmonary fibrosis featuring unusual, upper lobe predominant peribronchovascular areas of fibrotic consolidation, architectural distortion, bronchiectasis, and bronchiolectasis (series 4, image 34, 52). Mild centrilobular emphysema. Trace right pleural effusion. Upper Abdomen: No acute abnormality. Large cyst or hemangioma of the superior spleen, not significant changed compared to prior examination. Atrophic kidneys, partially imaged. Musculoskeletal: No chest wall mass or suspicious bone lesions identified. There are minimally  displaced fractures of the posterior right seventh through eleventh ribs. IMPRESSION: 1. There are minimally displaced fractures of the posterior right seventh through eleventh ribs. Trace right pleural effusion. No pneumothorax. 2. New enlargement of the tubular ascending thoracic aorta, measuring up to 4.0 x 4.0 cm, somewhat concerning for acute aortic injury in the setting of trauma. Consider contrast enhanced CT angiogram to further evaluate. Aortic Atherosclerosis (ICD10-I70.0) 3. There is a redemonstrated pattern of pulmonary fibrosis featuring unusual, upper lobe predominant peribronchovascular areas of fibrotic consolidation, architectural distortion, bronchiectasis, and bronchiolectasis. Findings are in keeping with an "alternative diagnosis" pattern of fibrosis by ATS pulmonary fibrosis criteria, primary differential consideration chronic fibrotic hypersensitivity pneumonitis or other inflammatory inhalational lung disease, or alternately pulmonary sarcoidosis despite the absence of nodularity or mediastinal lymphadenopathy. 4. Mild emphysema.  Emphysema (ICD10-J43.9). 5. Coronary artery disease. Electronically Signed   By: Eddie Candle M.D.   On: 12/18/2019 10:17   CT Cervical Spine Wo Contrast  Result Date: 12/18/2019 CLINICAL DATA:  Pain following fall EXAM: CT HEAD WITHOUT CONTRAST CT CERVICAL SPINE WITHOUT CONTRAST TECHNIQUE: Multidetector CT imaging of the head and cervical spine was performed following the standard protocol without intravenous contrast. Multiplanar CT image reconstructions of the cervical spine were also generated. COMPARISON:  CT head and CT cervical spine June 07, 2019 FINDINGS: CT HEAD FINDINGS Brain: There is stable age related volume loss. Stable mild invagination of CSF into the sella. There is no intracranial mass, hemorrhage, extra-axial fluid collection, or midline shift.  There is patchy small vessel disease in the centra semiovale bilaterally, stable. There is small  vessel disease in the anterior limbs of each internal and external capsule. No evident acute infarct. Vascular: No appreciable hyperdense vessels. There is calcification in the distal vertebral arteries and carotid siphon regions bilaterally. Skull: The bony calvarium appears intact. Sinuses/Orbits: There is mild mucosal thickening in several ethmoid air cells. Other visualized paranasal sinuses are clear. Orbits appear symmetric bilaterally. Other: Mastoid air cells are clear. CT CERVICAL SPINE FINDINGS Alignment: There is no appreciable spondylolisthesis. Skull base and vertebrae: Skull base and craniocervical junction regions appear normal. There is anterior wedging of the C7 vertebral body which was not present previously and appears potentially acute. There is no retropulsion of bone in this area. There is no other evidence suggesting fracture. No blastic or lytic bone lesions. Soft tissues and spinal canal: Prevertebral soft tissues and predental space regions are normal. No cord or canal hematoma. No paraspinous lesions are evident. Disc levels: There is moderate disc space narrowing at C4-5 with milder disc space narrowing at C6-7, stable. There is facet hypertrophy at multiple levels bilaterally. There is impression on exiting nerve roots at C5-6 on the right and at C6-7 bilaterally. No frank disc extrusion or stenosis. Upper chest: There is scarring in each upper lobe, stable Other: There is calcification in each carotid artery. IMPRESSION: CT head: Age related volume loss. Patchy supratentorial small vessel disease, stable. No acute infarct. No mass or hemorrhage. Foci of arterial vascular calcification noted. Mucosal thickening in several ethmoid air cells noted. CT cervical spine: 1. Anterior wedging of the C7 vertebral body, a recent and probably acute. No retropulsion of bone. No other fracture evident. No spondylolisthesis. 2. Multilevel osteoarthritic change with exit foraminal narrowing impressing  on exiting nerve roots on the right at C5-6 and C6-7 bilaterally. No frank disc extrusion or stenosis. 3.  Stable upper lobe parenchymal lung scarring. 4.  Carotid artery calcification bilaterally. Critical Value/emergent results were called by telephone at the time of interpretation on 12/18/2019 at 10:12 am to provider Krista Blue, PA, who verbally acknowledged these results. Electronically Signed   By: Lowella Grip III M.D.   On: 12/18/2019 10:14    Procedures Procedures (including critical care time)  Medications Ordered in ED Medications  lidocaine (LIDODERM) 5 % 1 patch (1 patch Transdermal Patch Applied 12/18/19 0824)  morphine 4 MG/ML injection 4 mg (4 mg Intravenous Given 12/18/19 2952)    ED Course  I have reviewed the triage vital signs and the nursing notes.  Pertinent labs & imaging results that were available during my care of the patient were reviewed by me and considered in my medical decision making (see chart for details).    MDM Rules/Calculators/A&P                          Bianca Myers is a 74 year old female with history of end-stage renal disease who presents to the ED with right-sided chest pain after fall yesterday.  X-rays have already been done that show minimally displaced right lateral sixth through ninth rib fractures.  Lab work was done that was overall unremarkable.  Patient did have dialysis yesterday.  Had a fall while getting out of the car after at dialysis.  Not sure if it was a syncopal event or orthostatic event or mechanical event as she is not know the exact details of her fall.  She has extreme pain with minimal  movement and inability to take a deep breath due to the pain.  Given for rib fractures at least patient will likely need close pain management in pulmonary toilet and will admit to medicine for further care.  We'll get a CT scan of her head and chest to further evaluate for injuries.  Overall she appears well otherwise.  No other  extremity tenderness on exam.  Will discuss case with trauma to see if they want to be involved however patient does not have any surgical process at this time.  Anticipate admission to medicine given chronic medical problems.  Patient found to have C7 fracture.  New from prior scans.  Does have some tenderness there.  Although has had some chronic neck pain.  Will talk to neurosurgery.  She is neurovascularly and neurologically intact.  Placed in a c-collar.  CT scan of the chest does show right 7th through 11th rib fractures.  There is also a new enlargement of the ascending thoracic aorta.  Although trauma mechanism is minor there is a concern for acute aortic injury in the setting of trauma per radiology.  I discussed this with trauma surgery who recommended touching base with cardiothoracic surgery about further recommendations.  Patient is end-stage renal disease.  States that she does make some minimal urine.  I will see if they would like a CT contrasted image but she is hemodynamically stable at this time.  Continue to anticipate admission to medicine.  Dr. Reatha Armour discussed cervical fracture with me over the phone.  He will evaluate the patient.  No nuerosurgical intervention at this time.  Placed in a cervical collar.  Talked with Dr. Kipp Brood with cardiothoracic surgery and states that patient can follow-up about thoracic aortic aneurysm.  In agreement that this is not from traumatic injury given mechanism especially.  Patient can follow-up outpatient but would likely not be a surgical candidate.  Patient to be admitted to medicine for further care.  Trauma surgery updated me and they will order CT scan of the chest, abdomen, pelvis with contrast to complete trauma work-up but at this time stable for admission to medicine.  This chart was dictated using voice recognition software.  Despite best efforts to proofread,  errors can occur which can change the documentation meaning.    Final  Clinical Impression(s) / ED Diagnoses Final diagnoses:  Closed fracture of multiple ribs of right side, initial encounter  Other closed nondisplaced fracture of seventh cervical vertebra, initial encounter [facility])    Rx / [facility] Orders ED Discharge Orders    None       Lennice Sites, DO 12/18/19 [place], [place], DO 12/18/19 [place], [place], DO 12/18/19 1137

## 2019-12-18 NOTE — ED Notes (Signed)
Off floor to CT 

## 2019-12-18 NOTE — ED Notes (Signed)
Patient transported to MRI 

## 2019-12-18 NOTE — Consult Note (Addendum)
Bianca Myers Jul 14, 1945  [id number].    Requesting MD: Dr. Ronnald Nian Chief Complaint/Reason for Consult: Ground level fall; rib fractures  HPI: Bianca Myers is a 74 y.o. 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   Patient reports that on Friday afternoon, after getting done dialysis, she arrived home with her caregiver.  While the caregiver was taking asked how she tried to get out of the car but fell onto the pavement on her right side.  She is unsure of head trauma.  No loss of consciousness.  She was able to use her walker and ambulate into the house.  She notes since that time she has been having midline lower neck pain, posterior right rib pain, right upper quadrant abdominal pain as well as left ankle pain. No other areas of pain. She has tried Tylenol at home for this without any relief.    She presented to the ED for evaluation.  She was found to have R 7-11th ribs without PTX, C7 vertebral body anterior wedging and Enlargement of thoracic aorta. Trauma was asked to see in consultation. Patient to be admitted to internal medicine.    ROS: Review of Systems  Constitutional: Negative for chills and fever.  Eyes: Negative for double vision and photophobia.  Respiratory: Positive for shortness of breath. Negative for cough.   Cardiovascular: Positive for chest pain. Negative for leg swelling.  Gastrointestinal: Positive for abdominal pain. Negative for constipation, diarrhea, nausea and vomiting.  Genitourinary:       No loss of bowel or bladder control. No changes in urination.  Reports she has 2-3 small episodes of urination per week  Musculoskeletal: Positive for neck pain. Negative for joint pain.  Skin: Negative for rash.       No lacerations or abrasions reported  Neurological: Negative for focal weakness, loss of consciousness and headaches.  Psychiatric/Behavioral: Negative for substance abuse.  All other systems reviewed  and are negative.   Family History  Family history unknown: Yes    Past Medical History:  Diagnosis Date  . Anemia   . Arthritis   . Chronic kidney disease (CKD) stage G5/A1, glomerular filtration rate (GFR) less than or equal to 15 mL/min/1.73 square meter and albuminuria creatinine ratio less than 30 mg/g (HCC)    Dialysis M/W/F  . Depression   . Diabetes mellitus ([place])   . Gout   . History of anemia due to chronic kidney disease   . History of degenerative disc disease   . Hypertension   . Pneumonia   . Secondary hyperparathyroidism ([place])   . Spondylisthesis     Past Surgical History:  Procedure Laterality Date  . A/V FISTULAGRAM Left 12/16/2018   Procedure: A/V FISTULAGRAM;  Surgeon: Marty Heck, MD;  Location: [facility];  Service: Cardiovascular;  Laterality: Left;  . A/V FISTULAGRAM Left 06/13/2019   Procedure: A/V FISTULAGRAM;  Surgeon: Marty Heck, MD;  Location: [facility];  Service: Cardiovascular;  Laterality: Left;  . AV FISTULA PLACEMENT Left 08/24/2018   Procedure: ARTERIOVENOUS (AV) FISTULA CREATION;  Surgeon: Waynetta Sandy, MD;  Location: [facility];  Service: Vascular;  Laterality: Left;  . AV FISTULA PLACEMENT Left 07/12/2019   Procedure: INSERTION OF LEFT UPPER ARM  ARTERIOVENOUS (AV) GORE-TEX GRAFT;  Surgeon: Elam Dutch, MD;  Location: [facility];  Service: Vascular;  Laterality: Left;  . BASCILIC VEIN TRANSPOSITION Left 11/04/2018   Procedure:  [place] TRANSPOSITION SECOND STAGE;  Surgeon: Waynetta Sandy, MD;  Location: [facility];  Service: Vascular;  Laterality: Left;  . COLONOSCOPY    . HIP SURGERY    . INSERTION OF DIALYSIS CATHETER N/A 06/15/2019   Procedure: INSERTION OF TUNNELED  DIALYSIS CATHETER RIGHT INTERNAL JUGULAR;  Surgeon: Serafina Mitchell, MD;  Location: [facility];  Service: Vascular;  Laterality: N/A;  . IR FLUORO GUIDE CV LINE RIGHT  08/21/2018  . IR FLUORO GUIDE CV LINE RIGHT  06/08/2019  . IR  REMOVAL TUN CV CATH W/O FL  03/11/2019  . IR US GUIDE VASC ACCESS RIGHT  08/21/2018  . IR US GUIDE VASC ACCESS RIGHT  06/08/2019  . PERIPHERAL VASCULAR BALLOON ANGIOPLASTY Left 06/13/2019   Procedure: PERIPHERAL VASCULAR BALLOON ANGIOPLASTY;  Surgeon: Marty Heck, MD;  Location: [facility];  Service: Cardiovascular;  Laterality: Left;  AVF  . PERIPHERAL VASCULAR INTERVENTION Left 12/16/2018   Procedure: PERIPHERAL VASCULAR INTERVENTION;  Surgeon: Marty Heck, MD;  Location: [facility];  Service: Cardiovascular;  Laterality: Left;  arm fistula  . TOTAL HIP ARTHROPLASTY Left 06/10/2019   Procedure: TOTAL HIP ARTHROPLASTY ANTERIOR APPROACH;  Surgeon: Leandrew Koyanagi, MD;  Location: [facility];  Service: Orthopedics;  Laterality: Left;    Social History:  reports that she quit smoking about 48 years ago. She has never used smokeless tobacco. She reports previous alcohol use. She reports that she does not use drugs. Patient reports that she lives at home with her caretaker (son).  She uses a walker at baseline. She is retired.  No tobacco or illicit drug use.  She notes she drinks 1-2 alcoholic beverages per year.  Allergies: No Known Allergies  (Not in a hospital admission)   Physical Exam: Blood pressure 138/87, pulse 75, temperature 98.5 F (36.9 C), resp. rate 20, height 5\' 7"  (1.702 m), weight 75 kg, SpO2 100 %. General: pleasant, WD/WN AA female who is laying in bed in NAD HEENT: head is normocephalic, atraumatic.  Sclera are noninjected.  PERRL.  Ears and nose without any masses or lesions.  Mouth is pink and moist. Dentition fair Neck: C-Collar in place Heart: regular, rate, and rhythm.  Normal s1,s2. No obvious murmurs, gallops, or rubs noted.  Palpable radial and pedal pulses bilaterally  Lungs: CTAB, no wheezes, rhonchi, or rales noted.  Respiratory effort nonlabored Abd: Soft, ND, RUQ tenderness without peritonitis. Abdomen otherwise NT. +BS, no masses, hernias,  or organomegaly MS: No ttp of the thoracic or lumbar spine. No step offs noted. Graft in left upper extremity. No tenderness of the RUE, LUE, or RLE.  Negative logroll test bilaterally.  No sacral crepitus.  No tenderness palpation of the left hip or knee.  Patient with tenderness along the left medial malleolus with some associated swelling.  No obvious deformity.  No tenderness palpation of the left foot.  No LE edema. Calves are soft and NT.  Skin: warm and dry with no masses, lesions, or rashes Psych: A&Ox4 with an appropriate affect Neuro: cranial nerves grossly intact, equal strength in BUE/BLE bilaterally, normal speech, though process intact  Results for orders placed or performed during the hospital encounter of 12/17/19 (from the past 48 hour(s))  CBC with Differential     Status: Abnormal   Collection Time: 12/17/19  7:54 PM  Result Value Ref Range   WBC 9.6 4.0 - 10.5 K/uL   RBC 3.92 3.87 - 5.11 MIL/uL   Hemoglobin 10.5 (L)  12.0 - 15.0 g/dL   HCT 33.6 (L) 36 - 46 %   MCV 85.7 80.0 - 100.0 fL   MCH 26.8 26.0 - 34.0 pg   MCHC 31.3 30.0 - 36.0 g/dL   RDW 17.8 (H) 11.5 - 15.5 %   Platelets 225 150 - 400 K/uL   nRBC 0.2 0.0 - 0.2 %   Neutrophils Relative % 66 %   Neutro Abs 6.3 1.7 - 7.7 K/uL   Lymphocytes Relative 21 %   Lymphs Abs 2.0 0.7 - 4.0 K/uL   Monocytes Relative 10 %   Monocytes Absolute 1.0 0.1 - 1.0 K/uL   Eosinophils Relative 3 %   Eosinophils Absolute 0.3 0.0 - 0.5 K/uL   Basophils Relative 0 %   Basophils Absolute 0.0 0.0 - 0.1 K/uL   Immature Granulocytes 0 %   Abs Immature Granulocytes 0.03 0.00 - 0.07 K/uL    Comment: Performed at [facility] [address], [place], [place] [address]  Comprehensive metabolic panel     Status: Abnormal   Collection Time: 12/17/19  7:54 PM  Result Value Ref Range   Sodium 130 (L) 135 - 145 mmol/L   Potassium 5.0 3.5 - 5.1 mmol/L   Chloride 88 (L) 98 - 111 mmol/L   CO2 27 22 - 32 mmol/L   Glucose, Bld 181 (H) 70  - 99 mg/dL    Comment: Glucose reference range applies only to samples taken after fasting for at least 8 hours.   BUN 45 (H) 8 - 23 mg/dL   Creatinine, Ser 6.25 (H) 0.44 - 1.00 mg/dL   Calcium 9.4 8.9 - 10.3 mg/dL   Total Protein 7.2 6.5 - 8.1 g/dL   Albumin 3.6 3.5 - 5.0 g/dL   AST 17 15 - 41 U/L   ALT 16 0 - 44 U/L   Alkaline Phosphatase 131 (H) 38 - 126 U/L   Total Bilirubin 0.5 0.3 - 1.2 mg/dL   GFR, Estimated 6 (L) >60 mL/min   Anion gap 15 5 - 15    Comment: Performed at [facility] [address] [place], [place] [address]  Respiratory Panel by RT PCR (Flu A&B, Covid) - Nasopharyngeal Swab     Status: None   Collection Time: 12/18/19  8:35 AM   Specimen: Nasopharyngeal Swab  Result Value Ref Range   SARS Coronavirus 2 by RT PCR NEGATIVE NEGATIVE    Comment: (NOTE) SARS-CoV-2 target nucleic acids are NOT DETECTED.  The SARS-CoV-2 RNA is generally detectable in upper respiratoy specimens during the acute phase of infection. The lowest concentration of SARS-CoV-2 viral copies this assay can detect is 131 copies/mL. A negative result does not preclude SARS-Cov-2 infection and should not be used as the sole basis for treatment or other patient management decisions. A negative result may occur with  improper specimen collection/handling, submission of specimen other than nasopharyngeal swab, presence of viral mutation(s) within the areas targeted by this assay, and inadequate number of viral copies (<131 copies/mL). A negative result must be combined with clinical observations, patient history, and epidemiological information. The expected result is Negative.  Fact Sheet for Patients:  PinkCheek.be  Fact Sheet for Healthcare Providers:  GravelBags.it  This test is no t yet approved or cleared by the [place] FDA and  has been authorized for detection and/or diagnosis of SARS-CoV-2 by FDA under an  Emergency Use Authorization (EUA). This EUA will remain  in effect (meaning this test can be  used) for the duration of the COVID-19 declaration under Section 564(b)(1) of the Act, 21 U.S.C. section 360bbb-3(b)(1), unless the authorization is terminated or revoked sooner.     Influenza A by PCR NEGATIVE NEGATIVE   Influenza B by PCR NEGATIVE NEGATIVE    Comment: (NOTE) The Xpert Xpress SARS-CoV-2/FLU/RSV assay is intended as an aid in  the diagnosis of influenza from Nasopharyngeal swab specimens and  should not be used as a sole basis for treatment. Nasal washings and  aspirates are unacceptable for Xpert Xpress SARS-CoV-2/FLU/RSV  testing.  Fact Sheet for Patients: PinkCheek.be  Fact Sheet for Healthcare Providers: GravelBags.it  This test is not yet approved or cleared by the [place] FDA and  has been authorized for detection and/or diagnosis of SARS-CoV-2 by  FDA under an Emergency Use Authorization (EUA). This EUA will remain  in effect (meaning this test can be used) for the duration of the  Covid-19 declaration under Section 564(b)(1) of the Act, 21  U.S.C. section 360bbb-3(b)(1), unless the authorization is  terminated or revoked. Performed at [facility], [place] [address] [place], [place] [address]    DG Ribs Unilateral W/Chest Right  Result Date: 12/17/2019 CLINICAL DATA:  Fall with right-sided chest pain. EXAM: RIGHT RIBS AND CHEST - 3+ VIEW COMPARISON:  Chest radiograph and CT 06/07/2019 FINDINGS: The cardiomediastinal silhouette is unchanged with normal heart size. Aortic atherosclerosis is noted. Bilateral upper lobe scarring and asymmetric right upper lobe volume loss are similar to the prior radiograph. No acute airspace consolidation, edema, sizable pleural effusion, or pneumothorax is identified. There are minimally displaced right lateral sixth through ninth rib fractures. A vascular stent is  noted in the left upper arm. IMPRESSION: Minimally displaced right lateral sixth through ninth rib fractures. Electronically Signed   By: Logan Bores M.D.   On: 12/17/2019 20:57   CT Head Wo Contrast  Result Date: 12/18/2019 CLINICAL DATA:  Pain following fall EXAM: CT HEAD WITHOUT CONTRAST CT CERVICAL SPINE WITHOUT CONTRAST TECHNIQUE: Multidetector CT imaging of the head and cervical spine was performed following the standard protocol without intravenous contrast. Multiplanar CT image reconstructions of the cervical spine were also generated. COMPARISON:  CT head and CT cervical spine June 07, 2019 FINDINGS: CT HEAD FINDINGS Brain: There is stable age related volume loss. Stable mild invagination of CSF into the sella. There is no intracranial mass, hemorrhage, extra-axial fluid collection, or midline shift. There is patchy small vessel disease in the centra semiovale bilaterally, stable. There is small vessel disease in the anterior limbs of each internal and external capsule. No evident acute infarct. Vascular: No appreciable hyperdense vessels. There is calcification in the distal vertebral arteries and carotid siphon regions bilaterally. Skull: The bony calvarium appears intact. Sinuses/Orbits: There is mild mucosal thickening in several ethmoid air cells. Other visualized paranasal sinuses are clear. Orbits appear symmetric bilaterally. Other: Mastoid air cells are clear. CT CERVICAL SPINE FINDINGS Alignment: There is no appreciable spondylolisthesis. Skull base and vertebrae: Skull base and craniocervical junction regions appear normal. There is anterior wedging of the C7 vertebral body which was not present previously and appears potentially acute. There is no retropulsion of bone in this area. There is no other evidence suggesting fracture. No blastic or lytic bone lesions. Soft tissues and spinal canal: Prevertebral soft tissues and predental space regions are normal. No cord or canal hematoma. No  paraspinous lesions are evident. Disc levels: There is moderate disc space narrowing at C4-5 with milder disc space  narrowing at C6-7, stable. There is facet hypertrophy at multiple levels bilaterally. There is impression on exiting nerve roots at C5-6 on the right and at C6-7 bilaterally. No frank disc extrusion or stenosis. Upper chest: There is scarring in each upper lobe, stable Other: There is calcification in each carotid artery. IMPRESSION: CT head: Age related volume loss. Patchy supratentorial small vessel disease, stable. No acute infarct. No mass or hemorrhage. Foci of arterial vascular calcification noted. Mucosal thickening in several ethmoid air cells noted. CT cervical spine: 1. Anterior wedging of the C7 vertebral body, a recent and probably acute. No retropulsion of bone. No other fracture evident. No spondylolisthesis. 2. Multilevel osteoarthritic change with exit foraminal narrowing impressing on exiting nerve roots on the right at C5-6 and C6-7 bilaterally. No frank disc extrusion or stenosis. 3.  Stable upper lobe parenchymal lung scarring. 4.  Carotid artery calcification bilaterally. Critical Value/emergent results were called by telephone at the time of interpretation on 12/18/2019 at 10:12 am to provider Krista Blue, PA, who verbally acknowledged these results. Electronically Signed   By: Lowella Grip III M.D.   On: 12/18/2019 10:14   CT Chest Wo Contrast  Result Date: 12/18/2019 CLINICAL DATA:  Rib fracture suspected, traumatic EXAM: CT CHEST WITHOUT CONTRAST TECHNIQUE: Multidetector CT imaging of the chest was performed following the standard protocol without IV contrast. COMPARISON:  06/07/2019 FINDINGS: Cardiovascular: Aortic atherosclerosis. New enlargement of the tubular ascending thoracic aorta, measuring up to 4.0 x 4.0 cm. Normal heart size. Three-vessel coronary artery calcifications. No pericardial effusion. Mediastinum/Nodes: No enlarged mediastinal, hilar, or  axillary lymph nodes. Thyroid gland, trachea, and esophagus demonstrate no significant findings. Lungs/Pleura: There is a redemonstrated pattern of pulmonary fibrosis featuring unusual, upper lobe predominant peribronchovascular areas of fibrotic consolidation, architectural distortion, bronchiectasis, and bronchiolectasis (series 4, image 34, 52). Mild centrilobular emphysema. Trace right pleural effusion. Upper Abdomen: No acute abnormality. Large cyst or hemangioma of the superior spleen, not significant changed compared to prior examination. Atrophic kidneys, partially imaged. Musculoskeletal: No chest wall mass or suspicious bone lesions identified. There are minimally displaced fractures of the posterior right seventh through eleventh ribs. IMPRESSION: 1. There are minimally displaced fractures of the posterior right seventh through eleventh ribs. Trace right pleural effusion. No pneumothorax. 2. New enlargement of the tubular ascending thoracic aorta, measuring up to 4.0 x 4.0 cm, somewhat concerning for acute aortic injury in the setting of trauma. Consider contrast enhanced CT angiogram to further evaluate. Aortic Atherosclerosis (ICD10-I70.0) 3. There is a redemonstrated pattern of pulmonary fibrosis featuring unusual, upper lobe predominant peribronchovascular areas of fibrotic consolidation, architectural distortion, bronchiectasis, and bronchiolectasis. Findings are in keeping with an "alternative diagnosis" pattern of fibrosis by ATS pulmonary fibrosis criteria, primary differential consideration chronic fibrotic hypersensitivity pneumonitis or other inflammatory inhalational lung disease, or alternately pulmonary sarcoidosis despite the absence of nodularity or mediastinal lymphadenopathy. 4. Mild emphysema.  Emphysema (ICD10-J43.9). 5. Coronary artery disease. Electronically Signed   By: Eddie Candle M.D.   On: 12/18/2019 10:17   CT Cervical Spine Wo Contrast  Result Date: 12/18/2019 CLINICAL  DATA:  Pain following fall EXAM: CT HEAD WITHOUT CONTRAST CT CERVICAL SPINE WITHOUT CONTRAST TECHNIQUE: Multidetector CT imaging of the head and cervical spine was performed following the standard protocol without intravenous contrast. Multiplanar CT image reconstructions of the cervical spine were also generated. COMPARISON:  CT head and CT cervical spine June 07, 2019 FINDINGS: CT HEAD FINDINGS Brain: There is stable age related volume loss. Stable mild invagination of  CSF into the sella. There is no intracranial mass, hemorrhage, extra-axial fluid collection, or midline shift. There is patchy small vessel disease in the centra semiovale bilaterally, stable. There is small vessel disease in the anterior limbs of each internal and external capsule. No evident acute infarct. Vascular: No appreciable hyperdense vessels. There is calcification in the distal vertebral arteries and carotid siphon regions bilaterally. Skull: The bony calvarium appears intact. Sinuses/Orbits: There is mild mucosal thickening in several ethmoid air cells. Other visualized paranasal sinuses are clear. Orbits appear symmetric bilaterally. Other: Mastoid air cells are clear. CT CERVICAL SPINE FINDINGS Alignment: There is no appreciable spondylolisthesis. Skull base and vertebrae: Skull base and craniocervical junction regions appear normal. There is anterior wedging of the C7 vertebral body which was not present previously and appears potentially acute. There is no retropulsion of bone in this area. There is no other evidence suggesting fracture. No blastic or lytic bone lesions. Soft tissues and spinal canal: Prevertebral soft tissues and predental space regions are normal. No cord or canal hematoma. No paraspinous lesions are evident. Disc levels: There is moderate disc space narrowing at C4-5 with milder disc space narrowing at C6-7, stable. There is facet hypertrophy at multiple levels bilaterally. There is impression on exiting nerve  roots at C5-6 on the right and at C6-7 bilaterally. No frank disc extrusion or stenosis. Upper chest: There is scarring in each upper lobe, stable Other: There is calcification in each carotid artery. IMPRESSION: CT head: Age related volume loss. Patchy supratentorial small vessel disease, stable. No acute infarct. No mass or hemorrhage. Foci of arterial vascular calcification noted. Mucosal thickening in several ethmoid air cells noted. CT cervical spine: 1. Anterior wedging of the C7 vertebral body, a recent and probably acute. No retropulsion of bone. No other fracture evident. No spondylolisthesis. 2. Multilevel osteoarthritic change with exit foraminal narrowing impressing on exiting nerve roots on the right at C5-6 and C6-7 bilaterally. No frank disc extrusion or stenosis. 3.  Stable upper lobe parenchymal lung scarring. 4.  Carotid artery calcification bilaterally. Critical Value/emergent results were called by telephone at the time of interpretation on 12/18/2019 at 10:12 am to provider Krista Blue, PA, who verbally acknowledged these results. Electronically Signed   By: Lowella Grip III M.D.   On: 12/18/2019 10:14   Anti-infectives (From admission, onward)   None      Assessment/Plan GLF R 7-11th ribs - No PTX on CT. Multimodal pain control. Pulm toilet  C7 vertebral body anterior wedging - C-Collar. EDP to consult NSGY  Enlargement of thoracic aorta - discussed with EDP. CT w/o contrast showed new enlargement of the tubular ascending thoracic aorta, measuring up to 4.0 x 4.0 cm. EDP to contact TCTS. I plan to scan the abdomen given her abdominal pain and will rescan the chest with contrast for further evaluation RUQ Abdominal pain - CT A/P Left ankle pain - plain films  Acute on chronic anemia - likely underlying anemia 2/2 CKD. Monitor. AM labs  Hx of MMP including but not limited to HTN, DM2, ESRF on HD. Patient also noted to have electrolyze distrubances on labs (Na 130, Chloride  88) - Per primary/TRH  FEN - NPO until scans result VTE - SCDs, hold chemical prophylaxis until cleared by NSGY ID - None required from a trauma standpoint at this time  Foley - None Plan - Agree with admission to medicine. Multimodal pain control & pulm toilet. NSGY and TCTS consult. CT C/A/P. Extremity films.   Legrand Como  Evette Cristal, [facility] Surgery 12/18/2019, 10:58 AM Please see Amion for pager number during day hours 7:00am-4:30pm

## 2019-12-18 NOTE — H&P (Signed)
Date: 12/18/2019               Patient Name:  Bianca Myers MRN: [id number]  DOB: December 20, 1945 Age / Sex: 73 y.o., female   PCP: [name], MD         Medical Service: Internal Medicine Teaching Service         Attending Physician: Dr. Rebeca Alert Raynaldo Opitz, MD    First Contact: Dr. Candie Chroman Pager: [phone]  Second Contact: Dr. Court Joy Pager: [phone]       After Hours (After 5p/  First Contact Pager: [phone]  weekends / holidays): Second Contact Pager: [phone]   Chief Complaint: Right sided chest s/p mechanical fall  History of Present Illness: Bianca Myers is a 74 year old women living with depression, HTN,  ESRD on MWF, T2DM, Secondary Hyperparathyroidism who presented with pain after a fall. Patient reports that she fell on Friday after coming from HD. Patient was at home and her care taker had gone in the house to put things down and the patient felt that she could get out of the care on her own. Patient reports that she was setting her up her walker and next thing she knew she was on the ground. Patient is unsure if she had LOC. Patient reports that she had some pain after the fall, but thought it would go away. She reports taking Tylenol, but it did not help. Patient presented to ED and reports that this when she came in her pain was 10/10 "the worse I pain I have had in my life." During the interview patient reports that her pain is a 7. She reports that she has significant right sided chest pain that is worse with movement and deep breaths. Patient reports that the pain is localized around her R breast radiates down underneath both breast and around to the R side of the back.  Patient is DNR this was confirmed after extensive conversation patient feels this best aligns with her health.  ED course: Patient received CT Head, cervical spine, chest, and abdomen, and pelvis. XR ankle Fractures diagnosed. Patient received morphine. Routine labs were drawn. C-collar was  placed. Meds:  Current Meds  Medication Sig  . acetaminophen (TYLENOL) 650 MG CR tablet Take 1,300 mg by mouth every 8 (eight) hours as needed for pain.  Marland Kitchen amLODipine (NORVASC) 10 MG tablet Take 1 tablet (10 mg total) by mouth daily.  . Ascorbic Acid (VITAMIN C) 1000 MG tablet Take 1,000 mg by mouth 2 (two) times a day.   Marland Kitchen atorvastatin (LIPITOR) 10 MG tablet Take 10 mg by mouth every evening.   . Cholecalciferol 100 MCG (4000 UT) CAPS Take 4,000 Units by mouth daily.  Marland Kitchen escitalopram (LEXAPRO) 10 MG tablet Take 10 mg by mouth daily.  . ferric citrate (AURYXIA) 1 GM 210 MG(Fe) tablet Take 420 mg by mouth 3 (three) times daily with meals.  . furosemide (LASIX) 40 MG tablet Take 1 tablet (40 mg total) by mouth every Tuesday, Thursday, Saturday, and Sunday.  . labetalol (NORMODYNE) 200 MG tablet Take 200 mg by mouth 2 (two) times daily.  Marland Kitchen lidocaine-prilocaine (EMLA) cream Apply 1 application topically as directed. Prior to Dialysis  . loperamide (IMODIUM) 2 MG capsule Take 4 mg by mouth every 4 (four) hours as needed for diarrhea or loose stools.   . multivitamin (RENA-VIT) TABS tablet Take 1 tablet by mouth at bedtime.  . Nutritional Supplements (FEEDING SUPPLEMENT, NEPRO CARB STEADY,) LIQD Take 240  mLs by mouth daily.  . Omega-3 Fatty Acids (FISH OIL) 1000 MG CAPS Take 1,000 mg by mouth daily.   . polyethylene glycol (MIRALAX) 17 g packet Take 17 g by mouth daily as needed for moderate constipation. (Patient taking differently: Take 17 g by mouth daily as needed for mild constipation or moderate constipation. )  . Turmeric 500 MG CAPS Take 500 mg by mouth daily.      Allergies: Allergies as of 12/17/2019  . (No Known Allergies)   Past Medical History:  Diagnosis Date  . Anemia   . Arthritis   . Chronic kidney disease (CKD) stage G5/A1, glomerular filtration rate (GFR) less than or equal to 15 mL/min/1.73 square meter and albuminuria creatinine ratio less than 30 mg/g (HCC)    Dialysis  M/W/F  . Depression   . Diabetes mellitus ([place])   . Gout   . History of anemia due to chronic kidney disease   . History of degenerative disc disease   . Hypertension   . Pneumonia   . Secondary hyperparathyroidism ([place])   . Spondylisthesis     Family History: Mother no health problems  Social History: [employer] with[employer]  Review of Systems: A complete ROS was negative except as per HPI.   Physical Exam: Blood pressure (!) 176/86, pulse 72, temperature 98.5 F (36.9 C), resp. rate 20, height 5\' 7"  (1.702 m), weight 75 kg, SpO2 100 %. Physical Exam Constitutional:      Appearance: She is not ill-appearing.     Comments: Patient is in [place]:     Head: Normocephalic and atraumatic.  Eyes:     Extraocular Movements: Extraocular movements intact.  Neck:     Comments: IN C collar Cardiovascular:     Rate and Rhythm: Normal rate and regular rhythm.  Pulmonary:     Breath sounds: Normal breath sounds.     Comments: Significant pain with deep breaths on the R side. Abdominal:     General: Abdomen is flat. Bowel sounds are normal.     Palpations: Abdomen is soft.     Tenderness: There is abdominal tenderness.     Comments: RUQ tender to palpation  Musculoskeletal:        General: Tenderness present.     Comments: Tender along the R chest,  Skin:    General: Skin is warm and dry.  Neurological:     General: No focal deficit present.     Mental Status: She is alert and oriented to person, place, and time.  Psychiatric:        Mood and Affect: Mood normal.        Behavior: Behavior normal.     EKG: personally reviewed my interpretation is NSR  DG Ribs Unilateral W/Chest Right  Result Date: 12/17/2019 CLINICAL DATA:  Fall with right-sided chest pain. EXAM: RIGHT RIBS AND CHEST - 3+ VIEW COMPARISON:  Chest radiograph and CT 06/07/2019 FINDINGS: The cardiomediastinal silhouette is unchanged with normal heart size. Aortic atherosclerosis is noted. Bilateral upper  lobe scarring and asymmetric right upper lobe volume loss are similar to the prior radiograph. No acute airspace consolidation, edema, sizable pleural effusion, or pneumothorax is identified. There are minimally displaced right lateral sixth through ninth rib fractures. A vascular stent is noted in the left upper arm. IMPRESSION: Minimally displaced right lateral sixth through ninth rib fractures. Electronically Signed   By: Logan Bores M.D.   On: 12/17/2019 20:57   DG Ankle Complete Left  Result Date:  12/18/2019 CLINICAL DATA:  Left ankle pain. EXAM: LEFT ANKLE COMPLETE - 3+ VIEW COMPARISON:  None. FINDINGS: Bilateral soft tissue swelling. No fractures or dislocation. Vascular calcifications. IMPRESSION: Bilateral soft tissue swelling. No acute abnormalities otherwise seen. Electronically Signed   By: Dorise Bullion III M.D   On: 12/18/2019 12:48   CT Head Wo Contrast  Result Date: 12/18/2019 CLINICAL DATA:  Pain following fall EXAM: CT HEAD WITHOUT CONTRAST CT CERVICAL SPINE WITHOUT CONTRAST TECHNIQUE: Multidetector CT imaging of the head and cervical spine was performed following the standard protocol without intravenous contrast. Multiplanar CT image reconstructions of the cervical spine were also generated. COMPARISON:  CT head and CT cervical spine June 07, 2019 FINDINGS: CT HEAD FINDINGS Brain: There is stable age related volume loss. Stable mild invagination of CSF into the sella. There is no intracranial mass, hemorrhage, extra-axial fluid collection, or midline shift. There is patchy small vessel disease in the centra semiovale bilaterally, stable. There is small vessel disease in the anterior limbs of each internal and external capsule. No evident acute infarct. Vascular: No appreciable hyperdense vessels. There is calcification in the distal vertebral arteries and carotid siphon regions bilaterally. Skull: The bony calvarium appears intact. Sinuses/Orbits: There is mild mucosal thickening in  several ethmoid air cells. Other visualized paranasal sinuses are clear. Orbits appear symmetric bilaterally. Other: Mastoid air cells are clear. CT CERVICAL SPINE FINDINGS Alignment: There is no appreciable spondylolisthesis. Skull base and vertebrae: Skull base and craniocervical junction regions appear normal. There is anterior wedging of the C7 vertebral body which was not present previously and appears potentially acute. There is no retropulsion of bone in this area. There is no other evidence suggesting fracture. No blastic or lytic bone lesions. Soft tissues and spinal canal: Prevertebral soft tissues and predental space regions are normal. No cord or canal hematoma. No paraspinous lesions are evident. Disc levels: There is moderate disc space narrowing at C4-5 with milder disc space narrowing at C6-7, stable. There is facet hypertrophy at multiple levels bilaterally. There is impression on exiting nerve roots at C5-6 on the right and at C6-7 bilaterally. No frank disc extrusion or stenosis. Upper chest: There is scarring in each upper lobe, stable Other: There is calcification in each carotid artery. IMPRESSION: CT head: Age related volume loss. Patchy supratentorial small vessel disease, stable. No acute infarct. No mass or hemorrhage. Foci of arterial vascular calcification noted. Mucosal thickening in several ethmoid air cells noted. CT cervical spine: 1. Anterior wedging of the C7 vertebral body, a recent and probably acute. No retropulsion of bone. No other fracture evident. No spondylolisthesis. 2. Multilevel osteoarthritic change with exit foraminal narrowing impressing on exiting nerve roots on the right at C5-6 and C6-7 bilaterally. No frank disc extrusion or stenosis. 3.  Stable upper lobe parenchymal lung scarring. 4.  Carotid artery calcification bilaterally. Critical Value/emergent results were called by telephone at the time of interpretation on 12/18/2019 at 10:12 am to provider Krista Blue,  PA, who verbally acknowledged these results. Electronically Signed   By: Lowella Grip III M.D.   On: 12/18/2019 10:14   CT Chest Wo Contrast  Result Date: 12/18/2019 CLINICAL DATA:  Rib fracture suspected, traumatic EXAM: CT CHEST WITHOUT CONTRAST TECHNIQUE: Multidetector CT imaging of the chest was performed following the standard protocol without IV contrast. COMPARISON:  06/07/2019 FINDINGS: Cardiovascular: Aortic atherosclerosis. New enlargement of the tubular ascending thoracic aorta, measuring up to 4.0 x 4.0 cm. Normal heart size. Three-vessel coronary artery calcifications. No pericardial  effusion. Mediastinum/Nodes: No enlarged mediastinal, hilar, or axillary lymph nodes. Thyroid gland, trachea, and esophagus demonstrate no significant findings. Lungs/Pleura: There is a redemonstrated pattern of pulmonary fibrosis featuring unusual, upper lobe predominant peribronchovascular areas of fibrotic consolidation, architectural distortion, bronchiectasis, and bronchiolectasis (series 4, image 34, 52). Mild centrilobular emphysema. Trace right pleural effusion. Upper Abdomen: No acute abnormality. Large cyst or hemangioma of the superior spleen, not significant changed compared to prior examination. Atrophic kidneys, partially imaged. Musculoskeletal: No chest wall mass or suspicious bone lesions identified. There are minimally displaced fractures of the posterior right seventh through eleventh ribs. IMPRESSION: 1. There are minimally displaced fractures of the posterior right seventh through eleventh ribs. Trace right pleural effusion. No pneumothorax. 2. New enlargement of the tubular ascending thoracic aorta, measuring up to 4.0 x 4.0 cm, somewhat concerning for acute aortic injury in the setting of trauma. Consider contrast enhanced CT angiogram to further evaluate. Aortic Atherosclerosis (ICD10-I70.0) 3. There is a redemonstrated pattern of pulmonary fibrosis featuring unusual, upper lobe predominant  peribronchovascular areas of fibrotic consolidation, architectural distortion, bronchiectasis, and bronchiolectasis. Findings are in keeping with an "alternative diagnosis" pattern of fibrosis by ATS pulmonary fibrosis criteria, primary differential consideration chronic fibrotic hypersensitivity pneumonitis or other inflammatory inhalational lung disease, or alternately pulmonary sarcoidosis despite the absence of nodularity or mediastinal lymphadenopathy. 4. Mild emphysema.  Emphysema (ICD10-J43.9). 5. Coronary artery disease. Electronically Signed   By: Eddie Candle M.D.   On: 12/18/2019 10:17   CT Cervical Spine Wo Contrast  Result Date: 12/18/2019 CLINICAL DATA:  Pain following fall EXAM: CT HEAD WITHOUT CONTRAST CT CERVICAL SPINE WITHOUT CONTRAST TECHNIQUE: Multidetector CT imaging of the head and cervical spine was performed following the standard protocol without intravenous contrast. Multiplanar CT image reconstructions of the cervical spine were also generated. COMPARISON:  CT head and CT cervical spine June 07, 2019 FINDINGS: CT HEAD FINDINGS Brain: There is stable age related volume loss. Stable mild invagination of CSF into the sella. There is no intracranial mass, hemorrhage, extra-axial fluid collection, or midline shift. There is patchy small vessel disease in the centra semiovale bilaterally, stable. There is small vessel disease in the anterior limbs of each internal and external capsule. No evident acute infarct. Vascular: No appreciable hyperdense vessels. There is calcification in the distal vertebral arteries and carotid siphon regions bilaterally. Skull: The bony calvarium appears intact. Sinuses/Orbits: There is mild mucosal thickening in several ethmoid air cells. Other visualized paranasal sinuses are clear. Orbits appear symmetric bilaterally. Other: Mastoid air cells are clear. CT CERVICAL SPINE FINDINGS Alignment: There is no appreciable spondylolisthesis. Skull base and vertebrae:  Skull base and craniocervical junction regions appear normal. There is anterior wedging of the C7 vertebral body which was not present previously and appears potentially acute. There is no retropulsion of bone in this area. There is no other evidence suggesting fracture. No blastic or lytic bone lesions. Soft tissues and spinal canal: Prevertebral soft tissues and predental space regions are normal. No cord or canal hematoma. No paraspinous lesions are evident. Disc levels: There is moderate disc space narrowing at C4-5 with milder disc space narrowing at C6-7, stable. There is facet hypertrophy at multiple levels bilaterally. There is impression on exiting nerve roots at C5-6 on the right and at C6-7 bilaterally. No frank disc extrusion or stenosis. Upper chest: There is scarring in each upper lobe, stable Other: There is calcification in each carotid artery. IMPRESSION: CT head: Age related volume loss. Patchy supratentorial small vessel disease, stable. No  acute infarct. No mass or hemorrhage. Foci of arterial vascular calcification noted. Mucosal thickening in several ethmoid air cells noted. CT cervical spine: 1. Anterior wedging of the C7 vertebral body, a recent and probably acute. No retropulsion of bone. No other fracture evident. No spondylolisthesis. 2. Multilevel osteoarthritic change with exit foraminal narrowing impressing on exiting nerve roots on the right at C5-6 and C6-7 bilaterally. No frank disc extrusion or stenosis. 3.  Stable upper lobe parenchymal lung scarring. 4.  Carotid artery calcification bilaterally. Critical Value/emergent results were called by telephone at the time of interpretation on 12/18/2019 at 10:12 am to provider Krista Blue, PA, who verbally acknowledged these results. Electronically Signed   By: Lowella Grip III M.D.   On: 12/18/2019 10:14   CT CHEST ABDOMEN PELVIS W CONTRAST  Result Date: 12/18/2019 CLINICAL DATA:  Trauma EXAM: CT CHEST, ABDOMEN, AND PELVIS  WITH CONTRAST TECHNIQUE: Multidetector CT imaging of the chest, abdomen and pelvis was performed following the standard protocol during bolus administration of intravenous contrast. CONTRAST:  116mL OMNIPAQUE IOHEXOL 300 MG/ML  SOLN COMPARISON:  Same day CT chest, 12/18/2019, CT chest abdomen pelvis, 06/07/2019 FINDINGS: CT CHEST FINDINGS Cardiovascular: Aortic atherosclerosis. As noted on prior examination, there is new enlargement of the tubular ascending thoracic aorta, measuring 4.0 x 3.9 cm, however without evidence of dissection or other acute pathology. Normal heart size. Three-vessel coronary artery calcification. No pericardial effusion. Mediastinum/Nodes: No enlarged mediastinal, hilar, or axillary lymph nodes. Thyroid gland, trachea, and esophagus demonstrate no significant findings. Lungs/Pleura: Trace right pleural effusion. Redemonstrated pattern of pulmonary fibrosis, described in detail on prior examination although featuring upper lobe predominant peribronchovascular areas of fibrotic consolidation, architectural distortion, bronchiectasis, bronchiolectasis. Mild centrilobular emphysema. Musculoskeletal: No chest wall mass or suspicious bone lesions identified. Redemonstrated minimally displaced fractures of the posterior right seventh through eleventh ribs, and additionally minimally fractures of the lateral right sixth through eighth ribs, not previously noted. CT ABDOMEN PELVIS FINDINGS Hepatobiliary: No solid liver abnormality is seen. Distended gallbladder. Mild biliary ductal dilatation to the ampulla, CBD measuring up to 9 mm in caliber. No obstructing calculi identified. This appearance is similar to prior examination dated 06/07/2019. Pancreas: Unremarkable. No pancreatic ductal dilatation or surrounding inflammatory changes. Spleen: Redemonstrated incidental simple cyst of the superior spleen measuring 5.3 cm. Adrenals/Urinary Tract: Adrenal glands are unremarkable. Atrophic kidneys. No  hydronephrosis. Bladder is unremarkable. Stomach/Bowel: Stomach is within normal limits. Appendix appears normal. No evidence of bowel wall thickening, distention, or inflammatory changes. Sigmoid diverticulosis. Vascular/Lymphatic: Aortic atherosclerosis. No enlarged abdominal or pelvic lymph nodes. Reproductive: Calcified uterine fibroids. Other: No abdominal wall hernia or abnormality. No abdominopelvic ascites. Musculoskeletal: Plate and screw fixation of the posterior right acetabulum. Status post left hip total arthroplasty. There are new, subacute to chronic fracture deformities of the right pubis (series 6, image 39). There are new sclerotic although nonacute fracture deformities of the right hemi sacrum (series 3, image 80). Focally very severe arthrosis of L5-S1. IMPRESSION: 1. As noted on prior examination, there is new enlargement of the tubular ascending thoracic aorta, measuring 4.0 x 3.9 cm, however without evidence of dissection or other acute pathology on this contrast enhanced examination. Recommend annual imaging followup by CTA or MRA. This recommendation follows 2010 ACCF/AHA/AATS/ACR/ASA/SCA/SCAI/SIR/STS/SVM Guidelines for the Diagnosis and Management of Patients with Thoracic Aortic Disease. Circulation. 2010; 121: W546-E703. Aortic aneurysm NOS (ICD10-I71.9). 2. Multiple redemonstrated right-sided rib fractures with associated trace pleural effusions. 3. New, subacute to chronic fracture deformities of the right pubis. There  are new sclerotic although nonacute fracture deformities of the right hemisacrum. These fractures were not noted on prior examination dated 06/07/2019. 4. No CT findings of acute traumatic injury to the organs of the chest, abdomen, or pelvis. 5. Redemonstrated unusual pattern of pulmonary fibrosis, described in detail on same day prior examination of the chest. Please see prior examination for discussion. 6. Renal atrophy, in keeping with dialysis dependent ESRD. 7.  Coronary artery disease. Aortic Atherosclerosis (ICD10-I70.0). Electronically Signed   By: Eddie Candle M.D.   On: 12/18/2019 13:13   Assessment & Plan by Problem: Active Problems:   Fracture  Fractured ribs and Cervical vertebra Patient had a mechanical fall. She is in significant pain and was found to have displaced fractures of the posterior right 7th-11th ribs and a trace pleural effusion. Negative for pneumothorax. Patient was also noted to have fracture of the C7 without repulsion of the bone. Surgery has been consulted by ED as well. Per their recommendation will repeat CXR tom and patient should have aggressive pulm toilet. -Admit patient for Med-Surg, IMTS, Dr. Rebeca Alert - Continue C-collar - Pain mgmt: Tylenol 650 q6 PRN,     Oxy IR 5mg  PRN,      Dilaudid 0.5mg  q4 h PRN       Lidocaine patch     Robaxin 500mg  q 6 PRN - Incentive spirometry - Pulm toilet - KUB in AM  Fall Patient is unsure why she fell. She does not remember much of the incident and is unsure if she had LOC. She did report that she usually has assistance when getting in and out the car and she chose to attempt to get out without her usual help. Patient was coming from HD when the fall occurred. Patient believes her last fall was many months ago and cannot really give a number how long ago it was. Per EMR patient has a history of HTN around HD due to anxiety. When patient's BP get elevated at HD she becomes light-headed and gets sever headaches.  Patient was also recently diagnosed with vertigo 09/2019. CT Head was insignificant. XR L Ankle did not find fracture. Patient was found to have fractured ribs and C7. CT Chest abdomen pelvis noted that there is new enlargement of the tubular ascending thoracic aorta, measuring 4.0 x 3.9 cm, however without evidence of dissection or other acute pathology.  - F/u BMP - PT/OT - Continue home gabapentin  HTN Patient has a history of HTN. Home medications: Norvasc 10mg , Labetalol  200mg  BID - NPO will restart medications when cleared for diet.  Mood disorder Patient home medication includes Lexapro 10mg . - will restart when patient able to take meds  HLD Patient home med Lipitor 40mg  T, TH, S, Sun. - Continue regimen when patient is clear for diet  ESRD (MWF) Patient is ESRD with HD on MWF. Patient last went to HD on Friday. Patient Hgb 10.5 this is down from patient's previous records were baseline appeared to be around 13. This may be new symptoms due to anemia of chronic disease from patient's ESRD. - Consult nephro - trend CBC  Dispo: Admit patient to Inpatient with expected length of stay greater than 2 midnights.  Signed: Freida Busman, MD 12/18/2019, 1:44 PM  Pager: [phone] After 5pm on weekdays and 1pm on weekends: On Call pager: [phone]

## 2019-12-18 NOTE — Hospital Course (Addendum)
74 year old woman with a history of ESRD on HD, T2DM, and HTN admitted after a mechanical fall.  Fall Fractured ribs and Cervical vertebra Patient presented after falling while trying to exit vehicle after HD. Patient had severe chest pain and presented 1 day after fall.  CT Head and Neck noted compression fracture of C7 of unknown age. Neurosurgery was consulted in the ED. MRI was done and concluded that the fracture may be chronic. Neurosurgery cleared patient C-spine and recommended 3 week follow-up for the C7 compression fracture. CT chest noted fractures of the Right ribs 7-11 with no pneumothorax. X ray of ankle did not show bony abnormality. Trauma surgery was consulted and did not feel surgery was required but implemented preferred pain control regimen and pulm toilet. Patient did not want to take ibuprofuen and was initially provided Tylenol, Oxycodone, Lidocaine patch, Robaxin, and Dilaudid for breakthrough pain. Patient preferred Dilaudid and oxycodone was discontinued. Eventually patient became responsive to ibuprofen therapy and it was incorporated into his regimen. Patient pain became more manageable and patient became more mobile. Patient worked with PT and OT and was recommended HHPT/OT if patient declined SNF, which patient did decline.   HTN HFpEF Patient was restarted on amlodipine half home dose per nephrology, but labetalol and lasix were held due to concern for hypotension in HD.   HLD  Continued patient home medication Lipitor on her T, TH, S , Sun schedule.  Mood disorder Continued patient on home Lexapro once approved for diet.  ESRD (MWF) Patient was noted to be hyperkalemic at presentation despite HD compliance. Patient went to urgent HD but graft was found to have clotted. IR placed temp cath and patient was able to received HD on her scehdule and hyperkalemia resolved. IR was later able to declot the graft and removed the temp cath. Patient completed HD according to her  schedule with her normal cath. Nephrology attempted to increase patient dry weight as patient complained of dizziness in HD and found herself feeling poorly after HD. Her fall leading to hospitalization occurred after HD.   Constipation Patient began reporting constipation inpatient. Patient responded to Miralax with senna-docusate.

## 2019-12-18 NOTE — ED Notes (Signed)
c collar placed

## 2019-12-18 NOTE — Consult Note (Signed)
   Providing Compassionate, Quality Care - Together  Neurosurgery Consult  Referring physician: ED Reason for referral: C7 Fx  Chief Complaint: Fall  History of Present Illness: This is a 74 year old female that had a fall on Friday after dialysis.  She is unsure how she fell but it sounds like it may have been hypotensive.  She denies any numbness, tingling, weakness at this time.  She does complain of some mild neck pain.  She denies any difficulty with fine motor movement.  She does state that she has had difficulty ambulating since a year ago when she started dialysis.  She attributes it to deconditioning at this time.  CT of the cervical spine was performed and showed a C7 compression fracture with no subluxation or malalignment or cord compression.  She is currently wearing a cervical collar   Medications: I have reviewed the patient's current medications. Allergies: No Known Allergies  History reviewed. No pertinent family history. Social History:  has no history on file for tobacco use, alcohol use, and drug use.  ROS: Review of systems obtained, see above in HPI  Physical Exam:  Vital signs in last 24 hours: Temp:  [98 F (36.7 C)-98.3 F (36.8 C)] 98 F (36.7 C) (07/25 1814) Pulse Rate:  [58-128] 65 (07/26 0746) Resp:  [11-18] 14 (07/26 0217) BP: (138-182)/(65-125) 153/88 (07/26 0700) SpO2:  [91 %-98 %] 96 % (07/26 0746) PE: AOx3 Face symmetric No acute distress PERRLA EOMI Bilateral upper/lower extremities 4+/5 Cervical collar in place Sensory intact to light touch   Impression/Assessment:  74 year old female with  1.  C7 compression fracture, unknown age 41.  Cervical spondylosis  Plan:  -Recommend MRI of the cervical spine -No acute neurosurgical intervention at this time, further recs once imaging completed -Okay for PT/OT with cervical collar -Cervical collar on while out of bed    Thank you for allowing me to participate in this patient's care.   Please do not hesitate to call with questions or concerns.   Elwin Sleight, [facility] Cell: [phone]

## 2019-12-18 NOTE — ED Notes (Signed)
Dr. Reatha Armour paged to [phone] Dr. Ronnald Nian pg by Levada Dy

## 2019-12-19 ENCOUNTER — Inpatient Hospital Stay (HOSPITAL_COMMUNITY): Payer: Medicare HMO

## 2019-12-19 DIAGNOSIS — S2241XA Multiple fractures of ribs, right side, initial encounter for closed fracture: Principal | ICD-10-CM

## 2019-12-19 DIAGNOSIS — N186 End stage renal disease: Secondary | ICD-10-CM

## 2019-12-19 DIAGNOSIS — E785 Hyperlipidemia, unspecified: Secondary | ICD-10-CM

## 2019-12-19 DIAGNOSIS — F39 Unspecified mood [affective] disorder: Secondary | ICD-10-CM

## 2019-12-19 DIAGNOSIS — E1122 Type 2 diabetes mellitus with diabetic chronic kidney disease: Secondary | ICD-10-CM

## 2019-12-19 DIAGNOSIS — S12600A Unspecified displaced fracture of seventh cervical vertebra, initial encounter for closed fracture: Secondary | ICD-10-CM

## 2019-12-19 DIAGNOSIS — W19XXXA Unspecified fall, initial encounter: Secondary | ICD-10-CM | POA: Diagnosis present

## 2019-12-19 DIAGNOSIS — S2249XA Multiple fractures of ribs, unspecified side, initial encounter for closed fracture: Secondary | ICD-10-CM | POA: Diagnosis present

## 2019-12-19 DIAGNOSIS — I712 Thoracic aortic aneurysm, without rupture: Secondary | ICD-10-CM | POA: Diagnosis present

## 2019-12-19 DIAGNOSIS — I7121 Aneurysm of the ascending aorta, without rupture: Secondary | ICD-10-CM | POA: Diagnosis present

## 2019-12-19 DIAGNOSIS — R10811 Right upper quadrant abdominal tenderness: Secondary | ICD-10-CM

## 2019-12-19 DIAGNOSIS — E875 Hyperkalemia: Secondary | ICD-10-CM | POA: Diagnosis present

## 2019-12-19 DIAGNOSIS — Z992 Dependence on renal dialysis: Secondary | ICD-10-CM

## 2019-12-19 DIAGNOSIS — I120 Hypertensive chronic kidney disease with stage 5 chronic kidney disease or end stage renal disease: Secondary | ICD-10-CM

## 2019-12-19 HISTORY — PX: IR US GUIDE VASC ACCESS RIGHT: IMG2390

## 2019-12-19 HISTORY — PX: IR PERC TUN PERIT CATH WO PORT S&I /IMAG: IMG2327

## 2019-12-19 LAB — BASIC METABOLIC PANEL
Anion gap: 17 — ABNORMAL HIGH (ref 5–15)
Anion gap: 16 — ABNORMAL HIGH (ref 5–15)
BUN: 72 mg/dL — ABNORMAL HIGH (ref 8–23)
BUN: 76 mg/dL — ABNORMAL HIGH (ref 8–23)
CO2: 25 mmol/L (ref 22–32)
CO2: 27 mmol/L (ref 22–32)
Calcium: 10.1 mg/dL (ref 8.9–10.3)
Calcium: 9.4 mg/dL (ref 8.9–10.3)
Chloride: 90 mmol/L — ABNORMAL LOW (ref 98–111)
Chloride: 87 mmol/L — ABNORMAL LOW (ref 98–111)
Creatinine, Ser: 9.06 mg/dL — ABNORMAL HIGH (ref 0.44–1.00)
Creatinine, Ser: 9.72 mg/dL — ABNORMAL HIGH (ref 0.44–1.00)
GFR, Estimated: 4 mL/min — ABNORMAL LOW (ref >60)
GFR, Estimated: 4 mL/min — ABNORMAL LOW (ref >60)
Glucose, Bld: 105 mg/dL — ABNORMAL HIGH (ref 70–99)
Glucose, Bld: 137 mg/dL — ABNORMAL HIGH (ref 70–99)
Potassium: 6.7 mmol/L (ref 3.5–5.1)
Potassium: 5.5 mmol/L — ABNORMAL HIGH (ref 3.5–5.1)
Sodium: 132 mmol/L — ABNORMAL LOW (ref 135–145)
Sodium: 130 mmol/L — ABNORMAL LOW (ref 135–145)

## 2019-12-19 LAB — CBC
HCT: 32.4 % — ABNORMAL LOW (ref 36.0–46.0)
Hemoglobin: 10.2 g/dL — ABNORMAL LOW (ref 12.0–15.0)
MCH: 26.7 pg (ref 26.0–34.0)
MCHC: 31.5 g/dL (ref 30.0–36.0)
MCV: 84.8 fL (ref 80.0–100.0)
Platelets: 241 10*3/uL (ref 150–400)
RBC: 3.82 MIL/uL — ABNORMAL LOW (ref 3.87–5.11)
RDW: 17.9 % — ABNORMAL HIGH (ref 11.5–15.5)
WBC: 9.7 10*3/uL (ref 4.0–10.5)
nRBC: 0.4 % — ABNORMAL HIGH (ref 0.0–0.2)

## 2019-12-19 IMAGING — DX DG CHEST 1V PORT
1 series · 1 of 1 positions shown · non-contrast
Comparison: Chest/rib radiographs [DATE] and CT [DATE]

CLINICAL DATA: Rib fractures.

EXAM:
PORTABLE CHEST 1 VIEW

[chest]
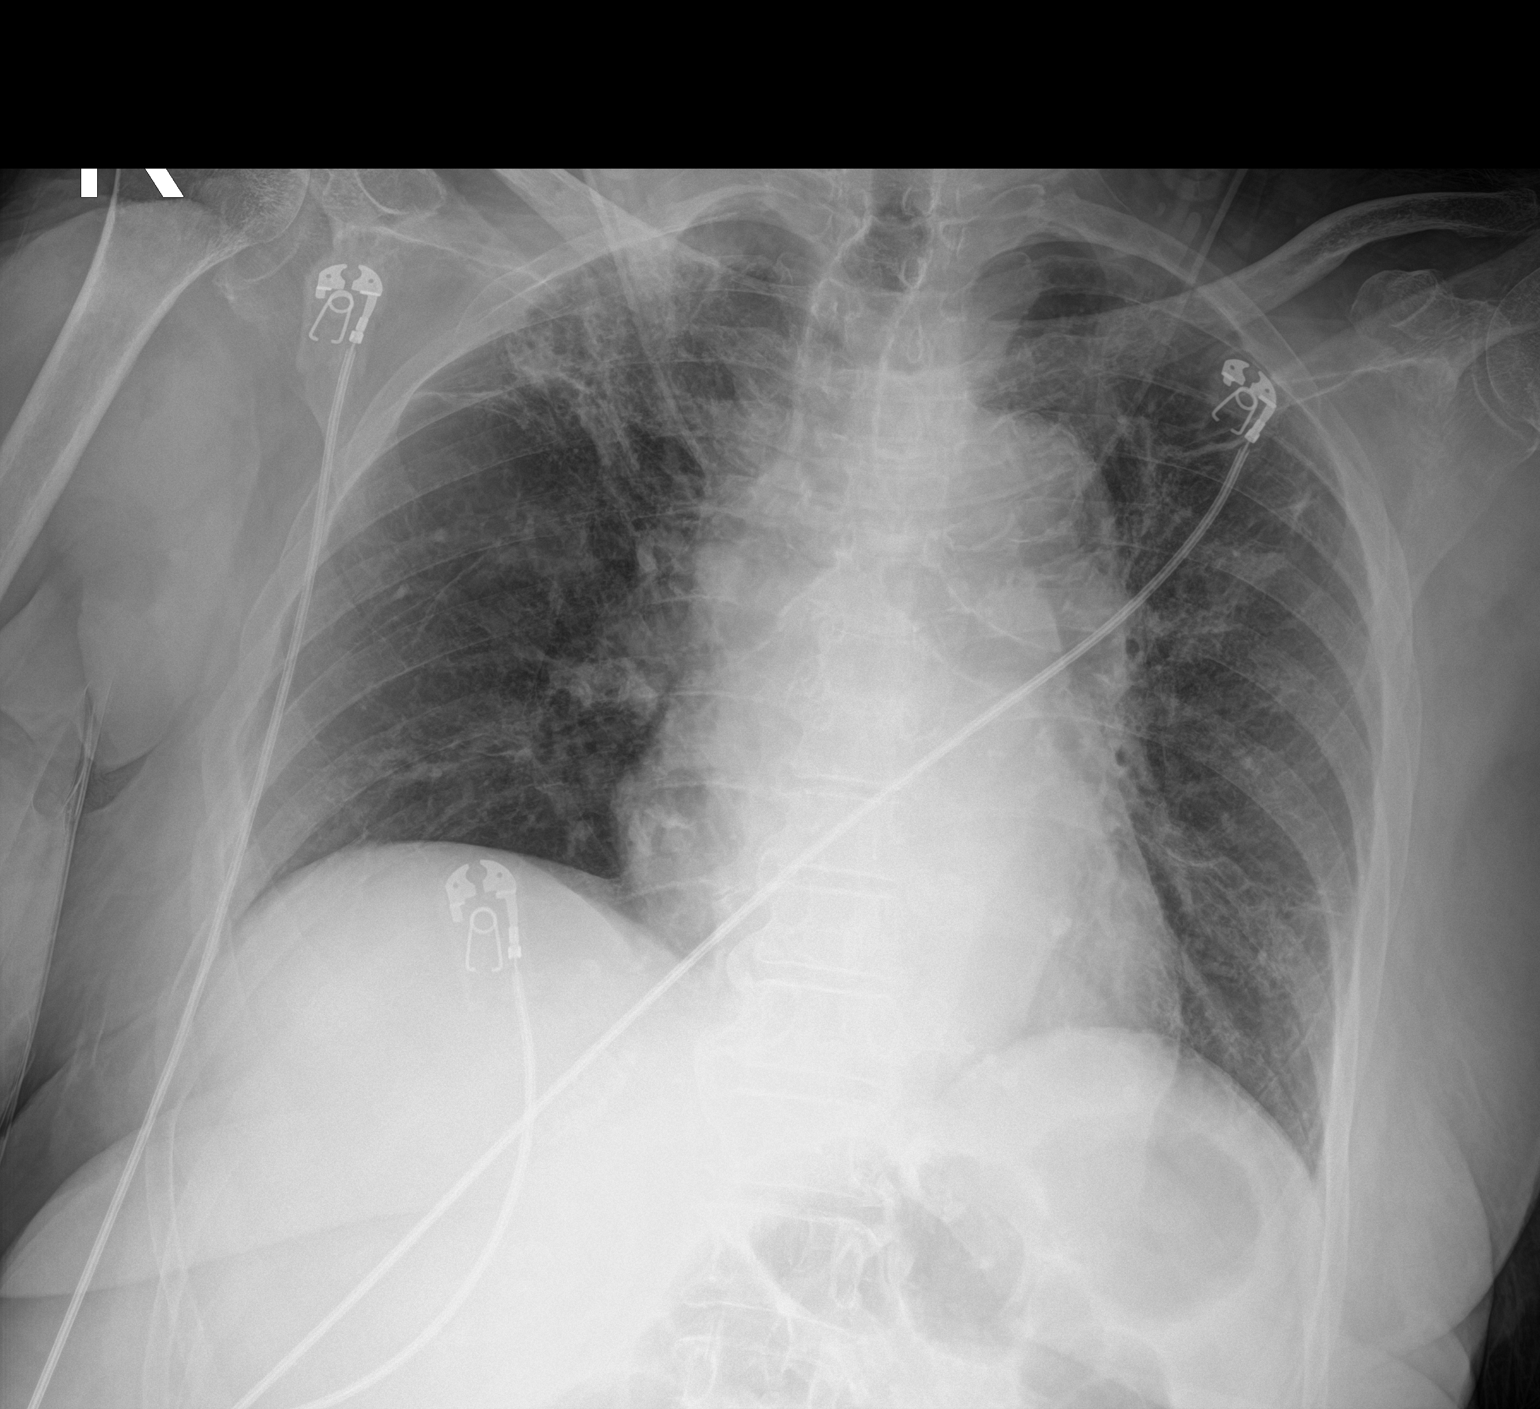

[1 of 1 positions shown; findings below may reference images not displayed]

FINDINGS: The cardiomediastinal silhouette is unchanged. Aortic
atherosclerosis is noted. Upper lobe predominant fibrosis is
unchanged, greatest in the right upper lobe with associated volume
loss. No acute airspace consolidation, edema, sizable pleural
effusion, or pneumothorax is identified. Multiple right rib
fractures are again noted.
IMPRESSION: Unchanged appearance of the chest.

## 2019-12-19 IMAGING — US IR TUNNELED CV CATH W/O PORT/PUMP >5YO
2 series · 5 of 5 positions shown · non-contrast
Comparison: none

INDICATION: 73-year-old female referred for a temporary hemodialysis catheter
placement

[Series 1: fl (-) angio · 1 of 1 slices shown]
[im 1/1]
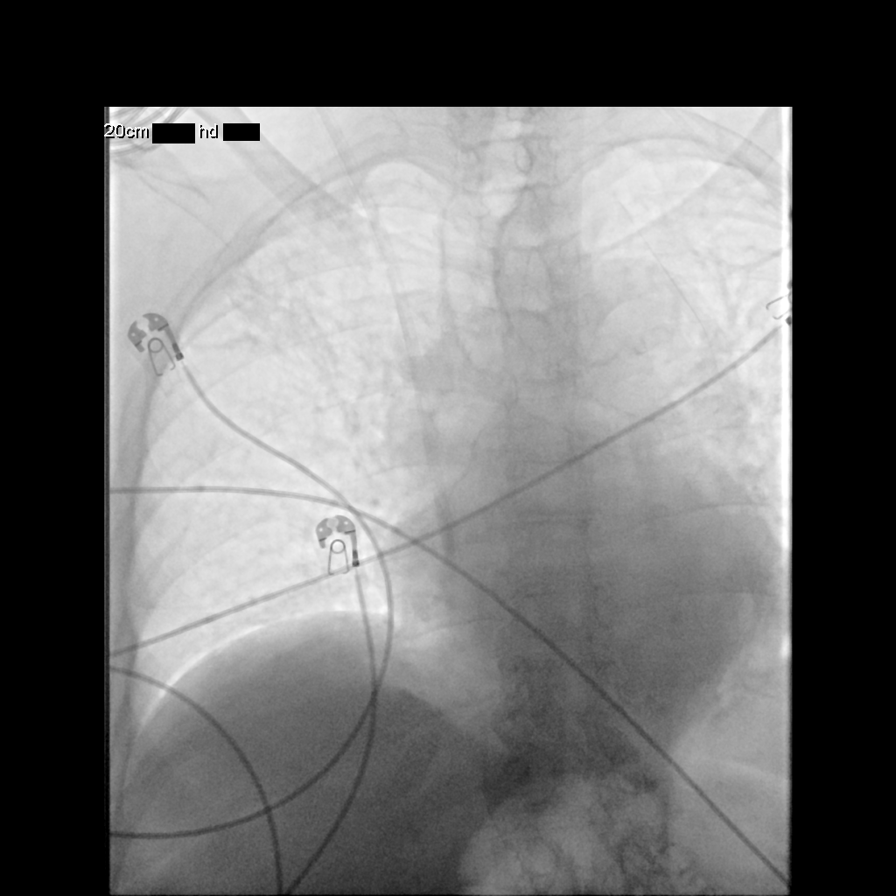

[Series 1: ir tunneled cv cath w/o port/pump >5yo · 4 of 4 slices shown]
[im 1/4]
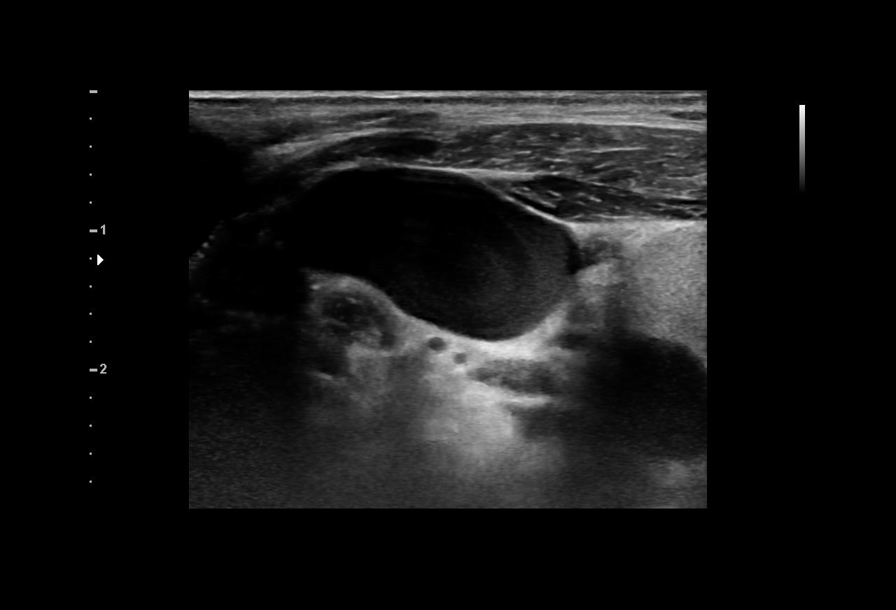
[im 2/4]
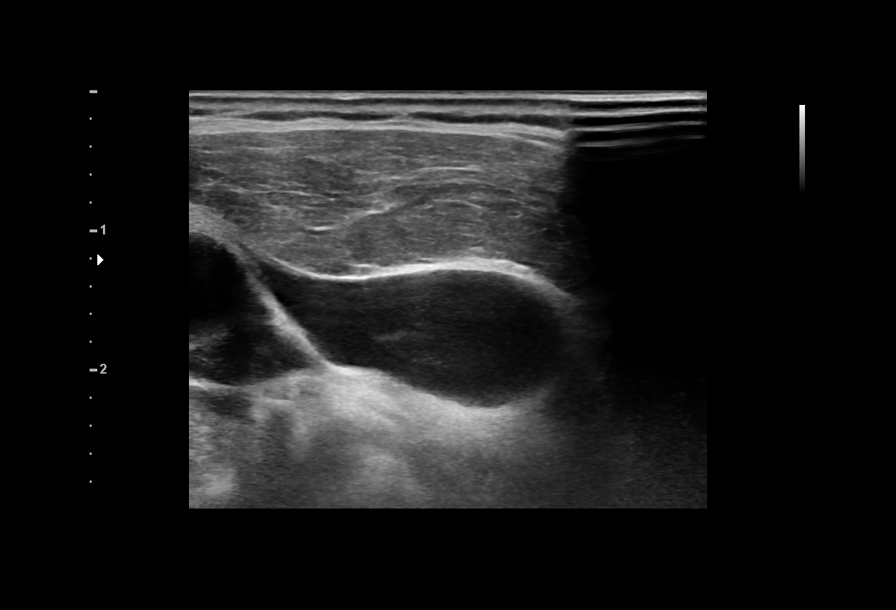
[im 3/4]
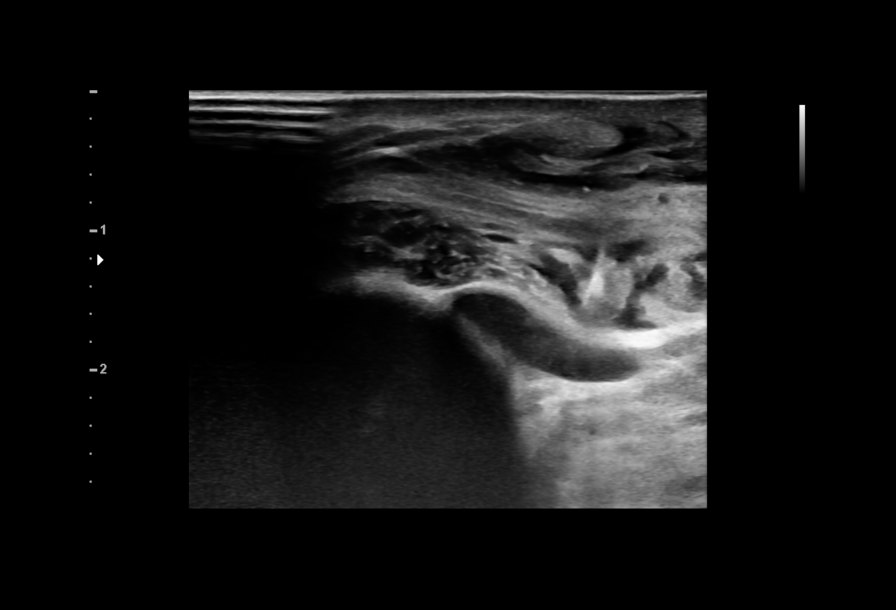
[im 4/4]
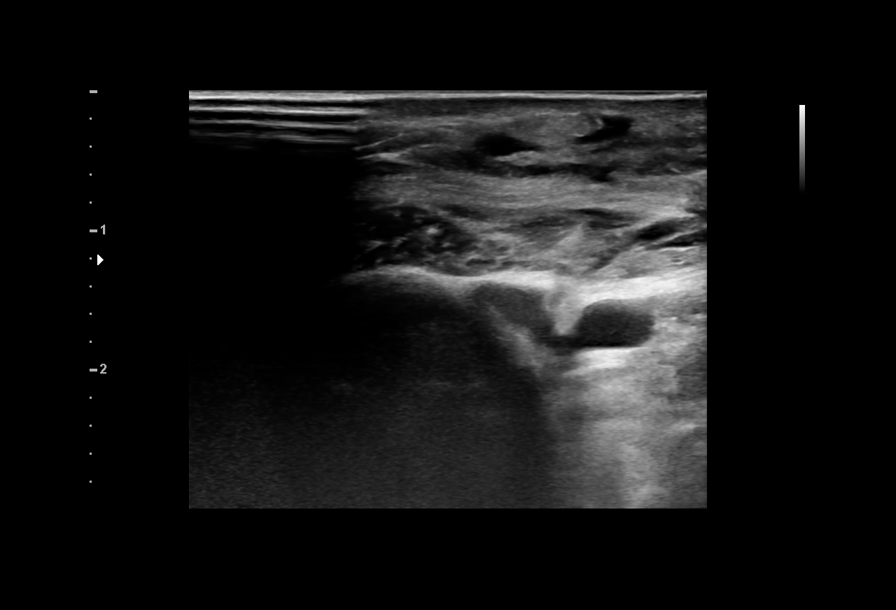

[5 of 5 positions shown; findings below may reference images not displayed]

EXAM:
IMAGE GUIDED PLACEMENT OF TEMPORARY HEMODIALYSIS CATHETER

MEDICATIONS:
None

ANESTHESIA/SEDATION:
None

FLUOROSCOPY TIME:  Fluoroscopy Time: 0 minutes 24 seconds (1 mGy).

COMPLICATIONS:
None

PROCEDURE:
Informed written consent was obtained from the patient and the
patient's family after a discussion of the risks, benefits, and
alternatives to treatment. Questions regarding the procedure were
encouraged and answered. The right neck was prepped with
chlorhexidine in a sterile fashion, and a sterile drape was applied
covering the operative field. Maximum barrier sterile technique with
sterile gowns and gloves were used for the procedure. A timeout was
performed prior to the initiation of the procedure.

A micropuncture kit was utilized to access the right internal
jugular vein under direct, real-time ultrasound guidance after the
overlying soft tissues were anesthetized with 1% lidocaine with
epinephrine. Ultrasound image documentation was performed. The
microwire was kinked to measure appropriate catheter length. A stiff
glidewire was advanced to the level of the IVC. A 20 cm hemodialysis
catheter was then placed over the wire.

Final catheter positioning was confirmed and documented with a spot
radiographic image. The catheter aspirates and flushes normally. The
catheter was flushed with appropriate volume heparin dwells.

Dressings were applied. The patient tolerated the procedure well
without immediate post procedural complication.
IMPRESSION: Status post image guided placement of a right IJ temporary
hemodialysis catheter.

## 2019-12-19 MED ORDER — PENTAFLUOROPROP-TETRAFLUOROETH EX AERO: 1.0000 "application " | INHALATION_SPRAY | CUTANEOUS | Status: DC | PRN

## 2019-12-19 MED ORDER — OXYCODONE HCL 5 MG PO TABS
5.0000 mg | ORAL_TABLET | ORAL | Status: DC | PRN
  Administered 2019-12-19 (×2): 10 mg via ORAL
  Filled 2019-12-19 (×2): qty 2

## 2019-12-19 MED ORDER — INSULIN ASPART 100 UNIT/ML IV SOLN
5.0000 [IU] | Freq: Once | INTRAVENOUS | Status: AC
  Administered 2019-12-19: 5 [IU] via INTRAVENOUS

## 2019-12-19 MED ORDER — SODIUM CHLORIDE 0.9 % IV SOLN: 100.0000 mL | INTRAVENOUS | Status: DC | PRN

## 2019-12-19 MED ORDER — LIDOCAINE-PRILOCAINE 2.5-2.5 % EX CREA
1.0000 "application " | TOPICAL_CREAM | CUTANEOUS | Status: DC | PRN
  Filled 2019-12-19: qty 5

## 2019-12-19 MED ORDER — SODIUM ZIRCONIUM CYCLOSILICATE 10 G PO PACK
10.0000 g | PACK | ORAL | Status: AC
  Administered 2019-12-19: 10 g via ORAL
  Filled 2019-12-19 (×2): qty 1

## 2019-12-19 MED ORDER — LIDOCAINE HCL (PF) 1 % IJ SOLN: 5.0000 mL | INTRAMUSCULAR | Status: DC | PRN

## 2019-12-19 MED ORDER — HEPARIN SODIUM (PORCINE) 1000 UNIT/ML DIALYSIS
1000.0000 [IU] | INTRAMUSCULAR | Status: DC | PRN
  Filled 2019-12-19: qty 1

## 2019-12-19 MED ORDER — CHLORHEXIDINE GLUCONATE CLOTH 2 % EX PADS
6.0000 | MEDICATED_PAD | Freq: Every day | CUTANEOUS | Status: DC
  Administered 2019-12-22 – 2019-12-25 (×4): 6 via TOPICAL

## 2019-12-19 MED ORDER — LIDOCAINE HCL (PF) 1 % IJ SOLN
INTRAMUSCULAR | Status: AC | PRN
  Administered 2019-12-19: 5 mL

## 2019-12-19 MED ORDER — HEPARIN SODIUM (PORCINE) 1000 UNIT/ML IJ SOLN
INTRAMUSCULAR | Status: AC
  Filled 2019-12-19: qty 1

## 2019-12-19 MED ORDER — HYDROMORPHONE HCL 1 MG/ML PO LIQD: 0.5000 mg | Freq: Four times a day (QID) | ORAL | Status: DC | PRN

## 2019-12-19 MED ORDER — SODIUM BICARBONATE 8.4 % IV SOLN
50.0000 meq | Freq: Once | INTRAVENOUS | Status: AC
  Administered 2019-12-19: 50 meq via INTRAVENOUS
  Filled 2019-12-19: qty 50

## 2019-12-19 MED ORDER — HYDROMORPHONE HCL 2 MG PO TABS
1.0000 mg | ORAL_TABLET | Freq: Four times a day (QID) | ORAL | Status: DC | PRN
  Administered 2019-12-19 – 2019-12-20 (×2): 1 mg via ORAL
  Filled 2019-12-19 (×2): qty 1

## 2019-12-19 MED ORDER — HEPARIN SODIUM (PORCINE) 1000 UNIT/ML IJ SOLN
INTRAMUSCULAR | Status: AC
  Administered 2019-12-19: 2800 [IU]
  Filled 2019-12-19: qty 3

## 2019-12-19 MED ORDER — IBUPROFEN 400 MG PO TABS: 400.0000 mg | ORAL_TABLET | Freq: Four times a day (QID) | ORAL | Status: DC | PRN

## 2019-12-19 MED ORDER — LIDOCAINE HCL 1 % IJ SOLN
INTRAMUSCULAR | Status: AC
  Filled 2019-12-19: qty 20

## 2019-12-19 MED ORDER — DEXTROSE 50 % IV SOLN
1.0000 | Freq: Once | INTRAVENOUS | Status: AC
  Administered 2019-12-19: 50 mL via INTRAVENOUS
  Filled 2019-12-19: qty 50

## 2019-12-19 MED ORDER — ALBUTEROL SULFATE (2.5 MG/3ML) 0.083% IN NEBU
10.0000 mg | INHALATION_SOLUTION | Freq: Once | RESPIRATORY_TRACT | Status: AC
  Administered 2019-12-19: 10 mg via RESPIRATORY_TRACT
  Filled 2019-12-19: qty 12

## 2019-12-19 MED ORDER — ALTEPLASE 2 MG IJ SOLR: 2.0000 mg | Freq: Once | INTRAMUSCULAR | Status: DC | PRN

## 2019-12-19 NOTE — H&P (Signed)
Patient Status: [name] - In-pt  Assessment and Plan: Patient in need of temporary dialysis access.  Patient s/p fall at home.  Presents to [facility] ED with multiple rib fractures and C7 compression fracture. She typically undergoes dialysis via LUE AVG, however found to be clotted today when attempting dialysis.  IR consulted for temporary dialysis catheter placement in patient with elevated potassium.    Risks and benefits discussed with the patient including, but not limited to bleeding, infection, vascular injury, pneumothorax which may require chest tube placement, air embolism or even death  All of the patient's questions were answered, patient is agreeable to proceed. Consent signed and in chart.  ______________________________________________________________________   History of Present Illness: Bianca Myers is a 74 y.o. female who suffered a fall at home; questionable hypotensive episode.  She is found to have posterior right 7th-11th rib fractures. She presented to the ED for pain control.  She typically undergoes HD via LUE graft, however upon arrival to HD today was found to have be clotted.  Unable to undergo dialysis. K 6.7  IR consulted for temp cath.   Allergies and medications reviewed.   Review of Systems: A 12 point ROS discussed and pertinent positives are indicated in the HPI above.  All other systems are negative.  Review of Systems  Constitutional: Negative for fatigue and fever.  Respiratory: Negative for cough and shortness of breath.   Cardiovascular: Positive for chest pain (rib fractures).  Gastrointestinal: Negative for abdominal pain.  Musculoskeletal: Negative for back pain.  Psychiatric/Behavioral: Negative for behavioral problems and confusion.    Vital Signs: BP 99/62 (BP Location: Right Arm)   Pulse 70   Temp 98.3 F (36.8 C) (Oral)   Resp 14   Ht 5\' 7"  (1.702 m)   Wt 165 lb 5.5 oz (75 kg)   SpO2 95%   BMI 25.90 kg/m   Physical  Exam Vitals and nursing note reviewed.  Constitutional:      General: She is not in acute distress.    Appearance: Normal appearance. She is not ill-appearing.  HENT:     Mouth/Throat:     Mouth: Mucous membranes are moist.     Pharynx: Oropharynx is clear.  Cardiovascular:     Rate and Rhythm: Normal rate and regular rhythm.  Pulmonary:     Effort: No respiratory distress.     Comments: Pain with inspiration Musculoskeletal:     Cervical back: Normal range of motion and neck supple.  Skin:    General: Skin is warm and dry.  Neurological:     General: No focal deficit present.     Mental Status: She is alert and oriented to person, place, and time. Mental status is at baseline.  Psychiatric:        Mood and Affect: Mood normal.        Behavior: Behavior normal.        Thought Content: Thought content normal.        Judgment: Judgment normal.      Imaging reviewed.   Labs:  COAGS: Recent Labs    06/08/19 0603  INR 1.3*    BMP: Recent Labs    06/12/19 0416 06/12/19 0416 06/13/19 0338 06/13/19 0338 06/14/19 0358 06/14/19 0358 06/15/19 0404 06/15/19 0404 06/15/19 1252 07/12/19 0807 12/17/19 1954 12/19/19 0615  NA 137   < > 133*   < > 134*   < > 132*   < > 135 135 130* 132*  K 3.4*   < >  3.8   < > 4.0   < > 4.2   < > 5.0 4.3 5.0 6.7*  CL 97*   < > 92*   < > 96*   < > 93*   < > 93* 94* 88* 90*  CO2 27   < > 25   < > 25  --  23  --   --   --  27 25  GLUCOSE 119*   < > 114*   < > 118*   < > 131*   < > 136* 86 181* 105*  BUN 24*   < > 47*   < > 26*   < > 44*   < > 15 23 45* 72*  CALCIUM 8.0*   < > 8.0*   < > 7.9*  --  8.5*  --   --   --  9.4 10.1  CREATININE 4.57*   < > 6.48*   < > 4.20*   < > 5.87*   < > 2.70* 5.80* 6.25* 9.06*  GFRNONAA 9*   < > 6*   < > 10*  --  7*  --   --   --  6* 4*  GFRAA 10*  --  7*  --  11*  --  8*  --   --   --   --   --    < > = values in this interval not displayed.       Electronically Signed: Docia Barrier,  PA 12/19/2019, 2:15 PM   I spent a total of 15 minutes in face to face in clinical consultation, greater than 50% of which was counseling/coordinating care for venous access.

## 2019-12-19 NOTE — Evaluation (Addendum)
Physical Therapy Evaluation Patient Details Name: Bianca Myers MRN: [id number] DOB: 1945/07/24 Today's Date: 12/19/2019   History of Present Illness  Bianca Myers is a 74 y.o. female with ESRD, HTN, T2DM, and gout who was admitted with rib and cervical fracture and hyperkalemia. Found to have  Right 7-11 Rib fxs, old C7 cervical compression fx, Subacute to chronic fracture deformities of the right pubis; Nonacute fracture deformities of the right hemisacrum.  Clogged HD catheter with missed sessions therefore pt going for temporary neck catheter 10/18.     Clinical Impression  Pt admitted with above diagnosis. Pt was able to sit EOB for about 10 minutes with min guard assist to min assist with difficulty due to severe pain right ribs per pt.  Limited to bed level today as pt was going to go for temporary neck dialysis catheter shortly.  Pt will need continued therapy to progress to prior level of function.  Pt currently with functional limitations due to the deficits listed below (see PT Problem List). Pt will benefit from skilled PT to increase their independence and safety with mobility to allow discharge to the venue listed below.      Follow Up Recommendations SNF;Supervision/Assistance - 24 hour    Equipment Recommendations  Other (comment) (TBA)    Recommendations for Other Services       Precautions / Restrictions Precautions Precautions: Fall Restrictions Weight Bearing Restrictions: No      Mobility  Bed Mobility Overal bed mobility: Needs Assistance Bed Mobility: Rolling;Sidelying to Sit Rolling: Mod assist Sidelying to sit: Mod assist       General bed mobility comments: Mod assist to have pt come to edge of stretcher.  Difficult to get in good position as pt was in pain.    Transfers                 General transfer comment: unable today. It was all pt could do today to sit up to sign her form when the nurse came in regarding consent for a  procedure.    Ambulation/Gait             General Gait Details: unable today  Stairs            Wheelchair Mobility    Modified Rankin (Stroke Patients Only)       Balance Overall balance assessment: Needs assistance Sitting-balance support: Bilateral upper extremity supported;Feet supported Sitting balance-Leahy Scale: Poor Sitting balance - Comments: relies on UE support and able to sit 10 min edge of stretcher with UE support to sign consent for MD.                                      Pertinent Vitals/Pain Pain Assessment: Faces Faces Pain Scale: Hurts whole lot Pain Location: right ribs Pain Descriptors / Indicators: Aching;Discomfort;Grimacing;Guarding Pain Intervention(s): Limited activity within patient's tolerance;Monitored during session;Repositioned    Home Living Family/patient expects to be discharged to:: Private residence Living Arrangements: Children (son) Available Help at Discharge: Family;Available 24 hours/day Type of Home: House Home Access: Stairs to enter Entrance Stairs-Rails: None Entrance Stairs-Number of Steps: 2 Home Layout: One level Home Equipment: Walker - 2 wheels;Cane - single point;Bedside commode;Shower seat;Walker - 4 wheels;Wheelchair - manual      Prior Function Level of Independence: Independent         Comments: Librarian, 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 Ind. w/all other ADL's  Hand Dominance   Dominant Hand: Right    Extremity/Trunk Assessment   Upper Extremity Assessment Upper Extremity Assessment: Defer to OT evaluation    Lower Extremity Assessment Lower Extremity Assessment: RLE deficits/detail;LLE deficits/detail RLE: Unable to fully assess due to pain LLE: Unable to fully assess due to pain    Cervical / Trunk Assessment Cervical / Trunk Assessment: Kyphotic  Communication   Communication: Receptive difficulties;Expressive difficulties  Cognition Arousal/Alertness:  Awake/alert Behavior During Therapy: Flat affect Overall Cognitive Status: Within Functional Limits for tasks assessed                                        General Comments General comments (skin integrity, edema, etc.): 71 bpm, 96% on RA until sitting up then down to 76% and unsure if good waveform but once back in bed O2 hovering between 85-90% therefore notified nursing and she was going to notify MD.      Exercises General Exercises - Lower Extremity Long Arc Quad: AROM;Both;5 reps;Seated   Assessment/Plan    PT Assessment Patient needs continued PT services  PT Problem List Decreased strength;Decreased range of motion;Decreased activity tolerance;Decreased balance;Decreased mobility;Decreased coordination;Decreased knowledge of use of DME;Decreased safety awareness;Decreased knowledge of precautions;Cardiopulmonary status limiting activity;Pain       PT Treatment Interventions DME instruction;Gait training;Functional mobility training;Therapeutic activities;Therapeutic exercise;Balance training;Patient/family education;Wheelchair mobility training    PT Goals (Current goals can be found in the Care Plan section)  Acute Rehab PT Goals Patient Stated Goal: to get rid of pain PT Goal Formulation: With patient Time For Goal Achievement: 01/02/20 Potential to Achieve Goals: Good    Frequency Min 3X/week   Barriers to discharge        Co-evaluation               AM-PAC PT "6 Clicks" Mobility  Outcome Measure Help needed turning from your back to your side while in a flat bed without using bedrails?: A Lot Help needed moving from lying on your back to sitting on the side of a flat bed without using bedrails?: A Lot Help needed moving to and from a bed to a chair (including a wheelchair)?: Total Help needed standing up from a chair using your arms (e.g., wheelchair or bedside chair)?: Total Help needed to walk in hospital room?: Total Help needed  climbing 3-5 steps with a railing? : Total 6 Click Score: 8    End of Session Equipment Utilized During Treatment: Gait belt Activity Tolerance: Patient limited by fatigue;Patient limited by pain Patient left: in bed;with call bell/phone within reach;with nursing/sitter in room (on stretcher) Nurse Communication: Mobility status (O2 sats decr with mobility) PT Visit Diagnosis: Muscle weakness (generalized) (M62.81);History of falling (Z91.81);Pain Pain - Right/Left: Right Pain - part of body: Leg (side)    Time: 2633-3545 PT Time Calculation (min) (ACUTE ONLY): 23 min   Charges:   PT Evaluation $PT Eval Moderate Complexity: 1 Mod PT Treatments $Therapeutic Activity: 8-22 mins        Deondrea Markos W,PT Acute Rehabilitation Services Pager:  [phone]  Office:  [place] 12/19/2019, 4:22 PM

## 2019-12-19 NOTE — Progress Notes (Addendum)
Subjective: CC: Patient reports right rib pain. No sob. No IS in room. Did have some hypoxia overnight. Currently saturating between 95-98% on RA. Feel dilaudid is helping. She notes after ortho surgery in the past she required 10mg  oxy and 5mg  oxy did not help. She does not feel like the oxy is helping her pain at the present. Abdominal pain has improved but some burping/belching. No n/v. She is NPO and asking if she can eat. Got to the side of the bed yesterday, has not gotten out of bed. No other complaints.   Objective: Vital signs in last 24 hours: Pulse Rate:  [66-75] 66 (10/18 0300) Resp:  [11-25] 13 (10/18 0600) BP: (115-181)/(76-99) 132/90 (10/18 0630) SpO2:  [85 %-100 %] 92 % (10/18 0300) FiO2 (%):  [21 %] 21 % (10/17 1327)    Intake/Output from previous day: No intake/output data recorded. Intake/Output this shift: No intake/output data recorded.  PE: Gen:  Alert, NAD, pleasant HEENT: EOM's intact, pupils equal and round Neck: C-Collar in place  Card:  RRR Pulm:  CTAB, no W/R/R, effort normal Abd: Soft, NT/ND, +BS Ext:  No LE edema Psych: A&Ox3  Skin: no rashes noted, warm and dry   Lab Results:  Recent Labs    12/17/19 1954 12/19/19 0615  WBC 9.6 9.7  HGB 10.5* 10.2*  HCT 33.6* 32.4*  PLT 225 241   BMET Recent Labs    12/17/19 1954 12/19/19 0615  NA 130* 132*  K 5.0 6.7*  CL 88* 90*  CO2 27 25  GLUCOSE 181* 105*  BUN 45* 72*  CREATININE 6.25* 9.06*  CALCIUM 9.4 10.1   PT/INR No results for input(s): LABPROT, INR in the last 72 hours. CMP     Component Value Date/Time   NA 132 (L) 12/19/2019 0615   K 6.7 (HH) 12/19/2019 0615   CL 90 (L) 12/19/2019 0615   CO2 25 12/19/2019 0615   GLUCOSE 105 (H) 12/19/2019 0615   BUN 72 (H) 12/19/2019 0615   CREATININE 9.06 (H) 12/19/2019 0615   CALCIUM 10.1 12/19/2019 0615   PROT 7.2 12/17/2019 1954   ALBUMIN 3.6 12/17/2019 1954   AST 17 12/17/2019 1954   ALT 16 12/17/2019 1954   ALKPHOS 131  (H) 12/17/2019 1954   BILITOT 0.5 12/17/2019 1954   GFRNONAA 4 (L) 12/19/2019 0615   GFRAA 8 (L) 06/15/2019 0404   Lipase     Component Value Date/Time   LIPASE 29 06/07/2019 1116       Studies/Results: DG Ribs Unilateral W/Chest Right  Result Date: 12/17/2019 CLINICAL DATA:  Fall with right-sided chest pain. EXAM: RIGHT RIBS AND CHEST - 3+ VIEW COMPARISON:  Chest radiograph and CT 06/07/2019 FINDINGS: The cardiomediastinal silhouette is unchanged with normal heart size. Aortic atherosclerosis is noted. Bilateral upper lobe scarring and asymmetric right upper lobe volume loss are similar to the prior radiograph. No acute airspace consolidation, edema, sizable pleural effusion, or pneumothorax is identified. There are minimally displaced right lateral sixth through ninth rib fractures. A vascular stent is noted in the left upper arm. IMPRESSION: Minimally displaced right lateral sixth through ninth rib fractures. Electronically Signed   By: Logan Bores M.D.   On: 12/17/2019 20:57   DG Ankle Complete Left  Result Date: 12/18/2019 CLINICAL DATA:  Left ankle pain. EXAM: LEFT ANKLE COMPLETE - 3+ VIEW COMPARISON:  None. FINDINGS: Bilateral soft tissue swelling. No fractures or dislocation. Vascular calcifications. IMPRESSION: Bilateral soft tissue swelling. No acute  abnormalities otherwise seen. Electronically Signed   By: Dorise Bullion III M.D   On: 12/18/2019 12:48   CT Head Wo Contrast  Result Date: 12/18/2019 CLINICAL DATA:  Pain following fall EXAM: CT HEAD WITHOUT CONTRAST CT CERVICAL SPINE WITHOUT CONTRAST TECHNIQUE: Multidetector CT imaging of the head and cervical spine was performed following the standard protocol without intravenous contrast. Multiplanar CT image reconstructions of the cervical spine were also generated. COMPARISON:  CT head and CT cervical spine June 07, 2019 FINDINGS: CT HEAD FINDINGS Brain: There is stable age related volume loss. Stable mild invagination of  CSF into the sella. There is no intracranial mass, hemorrhage, extra-axial fluid collection, or midline shift. There is patchy small vessel disease in the centra semiovale bilaterally, stable. There is small vessel disease in the anterior limbs of each internal and external capsule. No evident acute infarct. Vascular: No appreciable hyperdense vessels. There is calcification in the distal vertebral arteries and carotid siphon regions bilaterally. Skull: The bony calvarium appears intact. Sinuses/Orbits: There is mild mucosal thickening in several ethmoid air cells. Other visualized paranasal sinuses are clear. Orbits appear symmetric bilaterally. Other: Mastoid air cells are clear. CT CERVICAL SPINE FINDINGS Alignment: There is no appreciable spondylolisthesis. Skull base and vertebrae: Skull base and craniocervical junction regions appear normal. There is anterior wedging of the C7 vertebral body which was not present previously and appears potentially acute. There is no retropulsion of bone in this area. There is no other evidence suggesting fracture. No blastic or lytic bone lesions. Soft tissues and spinal canal: Prevertebral soft tissues and predental space regions are normal. No cord or canal hematoma. No paraspinous lesions are evident. Disc levels: There is moderate disc space narrowing at C4-5 with milder disc space narrowing at C6-7, stable. There is facet hypertrophy at multiple levels bilaterally. There is impression on exiting nerve roots at C5-6 on the right and at C6-7 bilaterally. No frank disc extrusion or stenosis. Upper chest: There is scarring in each upper lobe, stable Other: There is calcification in each carotid artery. IMPRESSION: CT head: Age related volume loss. Patchy supratentorial small vessel disease, stable. No acute infarct. No mass or hemorrhage. Foci of arterial vascular calcification noted. Mucosal thickening in several ethmoid air cells noted. CT cervical spine: 1. Anterior  wedging of the C7 vertebral body, a recent and probably acute. No retropulsion of bone. No other fracture evident. No spondylolisthesis. 2. Multilevel osteoarthritic change with exit foraminal narrowing impressing on exiting nerve roots on the right at C5-6 and C6-7 bilaterally. No frank disc extrusion or stenosis. 3.  Stable upper lobe parenchymal lung scarring. 4.  Carotid artery calcification bilaterally. Critical Value/emergent results were called by telephone at the time of interpretation on 12/18/2019 at 10:12 am to provider Krista Blue, PA, who verbally acknowledged these results. Electronically Signed   By: Lowella Grip III M.D.   On: 12/18/2019 10:14   CT Chest Wo Contrast  Result Date: 12/18/2019 CLINICAL DATA:  Rib fracture suspected, traumatic EXAM: CT CHEST WITHOUT CONTRAST TECHNIQUE: Multidetector CT imaging of the chest was performed following the standard protocol without IV contrast. COMPARISON:  06/07/2019 FINDINGS: Cardiovascular: Aortic atherosclerosis. New enlargement of the tubular ascending thoracic aorta, measuring up to 4.0 x 4.0 cm. Normal heart size. Three-vessel coronary artery calcifications. No pericardial effusion. Mediastinum/Nodes: No enlarged mediastinal, hilar, or axillary lymph nodes. Thyroid gland, trachea, and esophagus demonstrate no significant findings. Lungs/Pleura: There is a redemonstrated pattern of pulmonary fibrosis featuring unusual, upper lobe predominant peribronchovascular areas  of fibrotic consolidation, architectural distortion, bronchiectasis, and bronchiolectasis (series 4, image 34, 52). Mild centrilobular emphysema. Trace right pleural effusion. Upper Abdomen: No acute abnormality. Large cyst or hemangioma of the superior spleen, not significant changed compared to prior examination. Atrophic kidneys, partially imaged. Musculoskeletal: No chest wall mass or suspicious bone lesions identified. There are minimally displaced fractures of the posterior  right seventh through eleventh ribs. IMPRESSION: 1. There are minimally displaced fractures of the posterior right seventh through eleventh ribs. Trace right pleural effusion. No pneumothorax. 2. New enlargement of the tubular ascending thoracic aorta, measuring up to 4.0 x 4.0 cm, somewhat concerning for acute aortic injury in the setting of trauma. Consider contrast enhanced CT angiogram to further evaluate. Aortic Atherosclerosis (ICD10-I70.0) 3. There is a redemonstrated pattern of pulmonary fibrosis featuring unusual, upper lobe predominant peribronchovascular areas of fibrotic consolidation, architectural distortion, bronchiectasis, and bronchiolectasis. Findings are in keeping with an "alternative diagnosis" pattern of fibrosis by ATS pulmonary fibrosis criteria, primary differential consideration chronic fibrotic hypersensitivity pneumonitis or other inflammatory inhalational lung disease, or alternately pulmonary sarcoidosis despite the absence of nodularity or mediastinal lymphadenopathy. 4. Mild emphysema.  Emphysema (ICD10-J43.9). 5. Coronary artery disease. Electronically Signed   By: Eddie Candle M.D.   On: 12/18/2019 10:17   CT Cervical Spine Wo Contrast  Result Date: 12/18/2019 CLINICAL DATA:  Pain following fall EXAM: CT HEAD WITHOUT CONTRAST CT CERVICAL SPINE WITHOUT CONTRAST TECHNIQUE: Multidetector CT imaging of the head and cervical spine was performed following the standard protocol without intravenous contrast. Multiplanar CT image reconstructions of the cervical spine were also generated. COMPARISON:  CT head and CT cervical spine June 07, 2019 FINDINGS: CT HEAD FINDINGS Brain: There is stable age related volume loss. Stable mild invagination of CSF into the sella. There is no intracranial mass, hemorrhage, extra-axial fluid collection, or midline shift. There is patchy small vessel disease in the centra semiovale bilaterally, stable. There is small vessel disease in the anterior limbs  of each internal and external capsule. No evident acute infarct. Vascular: No appreciable hyperdense vessels. There is calcification in the distal vertebral arteries and carotid siphon regions bilaterally. Skull: The bony calvarium appears intact. Sinuses/Orbits: There is mild mucosal thickening in several ethmoid air cells. Other visualized paranasal sinuses are clear. Orbits appear symmetric bilaterally. Other: Mastoid air cells are clear. CT CERVICAL SPINE FINDINGS Alignment: There is no appreciable spondylolisthesis. Skull base and vertebrae: Skull base and craniocervical junction regions appear normal. There is anterior wedging of the C7 vertebral body which was not present previously and appears potentially acute. There is no retropulsion of bone in this area. There is no other evidence suggesting fracture. No blastic or lytic bone lesions. Soft tissues and spinal canal: Prevertebral soft tissues and predental space regions are normal. No cord or canal hematoma. No paraspinous lesions are evident. Disc levels: There is moderate disc space narrowing at C4-5 with milder disc space narrowing at C6-7, stable. There is facet hypertrophy at multiple levels bilaterally. There is impression on exiting nerve roots at C5-6 on the right and at C6-7 bilaterally. No frank disc extrusion or stenosis. Upper chest: There is scarring in each upper lobe, stable Other: There is calcification in each carotid artery. IMPRESSION: CT head: Age related volume loss. Patchy supratentorial small vessel disease, stable. No acute infarct. No mass or hemorrhage. Foci of arterial vascular calcification noted. Mucosal thickening in several ethmoid air cells noted. CT cervical spine: 1. Anterior wedging of the C7 vertebral body, a recent and probably  acute. No retropulsion of bone. No other fracture evident. No spondylolisthesis. 2. Multilevel osteoarthritic change with exit foraminal narrowing impressing on exiting nerve roots on the right  at C5-6 and C6-7 bilaterally. No frank disc extrusion or stenosis. 3.  Stable upper lobe parenchymal lung scarring. 4.  Carotid artery calcification bilaterally. Critical Value/emergent results were called by telephone at the time of interpretation on 12/18/2019 at 10:12 am to provider Krista Blue, PA, who verbally acknowledged these results. Electronically Signed   By: Lowella Grip III M.D.   On: 12/18/2019 10:14   MR CERVICAL SPINE WO CONTRAST  Result Date: 12/18/2019 CLINICAL DATA:  Spinal stenosis. Multiple acute right-sided rib fractures after a fall. EXAM: MRI CERVICAL SPINE WITHOUT CONTRAST TECHNIQUE: Multiplanar, multisequence MR imaging of the cervical spine was performed. No intravenous contrast was administered. COMPARISON:  None. FINDINGS: Despite efforts by the technologist and patient, motion artifact is present on today's exam and could not be eliminated. This reduces exam sensitivity and specificity. Alignment: No vertebral subluxation is observed. Vertebrae: Mild anterior wedging at C7 is present. I do not see significant edema signal in the C7 vertebral body on STIR images, but there is a band of low T1 and T2 signal centrally in the C7 vertebral body (no corresponding visible fracture on the CT). Also, while the degree of anterior wedging is similar between the 06/07/2019 exam and the 01/18/2020 CT examination, the degree of sclerosis along the superior endplate of C7 may be minimally increased, although this is equivocal. Irregularity along the anterior superior endplate of C7 appears to be stable between the 2 CT examinations. There is also mild superior endplate concavity at the T2 vertebral level, not changed from 06/07/2019, an without vertebral edema. Disc desiccation is present throughout the cervical spine. Cord: Taking into account the degree of motion artifact, no abnormal cord signal is identified. Posterior Fossa, vertebral arteries, paraspinal tissues: Partially empty  sella. Disc levels: C2-3: Unremarkable C3-4: Mild central narrowing of the thecal sac and borderline bilateral foraminal stenosis due to central disc protrusion, uncinate spurring, and facet arthropathy. C4-5: Prominent left and moderate right foraminal stenosis and moderate central narrowing of the thecal sac due to intervertebral spurring, disc bulge, uncinate spurring, and left facet arthropathy. C5-6: Moderate bilateral foraminal stenosis and moderate central narrowing of the thecal sac due to right eccentric disc bulge, intervertebral spurring, and facet arthropathy. C6-7: Moderate left and mild right foraminal stenosis due to disc bulge, uncinate spurring, and facet arthropathy. C7-T1: Unremarkable. IMPRESSION: 1. There is no edema associated with the anterior wedging of C7. Retrospectively a roughly similar appearance is present on 06/07/2019 compared to today's CT, with mild sclerosis along the superior endplate and mild anterior wedging with the vertical height of the vertebral body being about 1.0 cm in the area of wedging. Accordingly I am skeptical of acute fracture, and I suspect this appearance is due to chronic subsidence. 2. Cervical spondylosis and degenerative disc disease, causing multilevel impingement as detailed above. 3. Partially empty sella. 4. Despite efforts by the technologist and patient, motion artifact is present on today's exam and could not be eliminated. This reduces exam sensitivity and specificity. Electronically Signed   By: Van Clines M.D.   On: 12/18/2019 15:23   CT CHEST ABDOMEN PELVIS W CONTRAST  Result Date: 12/18/2019 CLINICAL DATA:  Trauma EXAM: CT CHEST, ABDOMEN, AND PELVIS WITH CONTRAST TECHNIQUE: Multidetector CT imaging of the chest, abdomen and pelvis was performed following the standard protocol during bolus administration of intravenous  contrast. CONTRAST:  146mL OMNIPAQUE IOHEXOL 300 MG/ML  SOLN COMPARISON:  Same day CT chest, 12/18/2019, CT chest  abdomen pelvis, 06/07/2019 FINDINGS: CT CHEST FINDINGS Cardiovascular: Aortic atherosclerosis. As noted on prior examination, there is new enlargement of the tubular ascending thoracic aorta, measuring 4.0 x 3.9 cm, however without evidence of dissection or other acute pathology. Normal heart size. Three-vessel coronary artery calcification. No pericardial effusion. Mediastinum/Nodes: No enlarged mediastinal, hilar, or axillary lymph nodes. Thyroid gland, trachea, and esophagus demonstrate no significant findings. Lungs/Pleura: Trace right pleural effusion. Redemonstrated pattern of pulmonary fibrosis, described in detail on prior examination although featuring upper lobe predominant peribronchovascular areas of fibrotic consolidation, architectural distortion, bronchiectasis, bronchiolectasis. Mild centrilobular emphysema. Musculoskeletal: No chest wall mass or suspicious bone lesions identified. Redemonstrated minimally displaced fractures of the posterior right seventh through eleventh ribs, and additionally minimally fractures of the lateral right sixth through eighth ribs, not previously noted. CT ABDOMEN PELVIS FINDINGS Hepatobiliary: No solid liver abnormality is seen. Distended gallbladder. Mild biliary ductal dilatation to the ampulla, CBD measuring up to 9 mm in caliber. No obstructing calculi identified. This appearance is similar to prior examination dated 06/07/2019. Pancreas: Unremarkable. No pancreatic ductal dilatation or surrounding inflammatory changes. Spleen: Redemonstrated incidental simple cyst of the superior spleen measuring 5.3 cm. Adrenals/Urinary Tract: Adrenal glands are unremarkable. Atrophic kidneys. No hydronephrosis. Bladder is unremarkable. Stomach/Bowel: Stomach is within normal limits. Appendix appears normal. No evidence of bowel wall thickening, distention, or inflammatory changes. Sigmoid diverticulosis. Vascular/Lymphatic: Aortic atherosclerosis. No enlarged abdominal or pelvic  lymph nodes. Reproductive: Calcified uterine fibroids. Other: No abdominal wall hernia or abnormality. No abdominopelvic ascites. Musculoskeletal: Plate and screw fixation of the posterior right acetabulum. Status post left hip total arthroplasty. There are new, subacute to chronic fracture deformities of the right pubis (series 6, image 39). There are new sclerotic although nonacute fracture deformities of the right hemi sacrum (series 3, image 80). Focally very severe arthrosis of L5-S1. IMPRESSION: 1. As noted on prior examination, there is new enlargement of the tubular ascending thoracic aorta, measuring 4.0 x 3.9 cm, however without evidence of dissection or other acute pathology on this contrast enhanced examination. Recommend annual imaging followup by CTA or MRA. This recommendation follows 2010 ACCF/AHA/AATS/ACR/ASA/SCA/SCAI/SIR/STS/SVM Guidelines for the Diagnosis and Management of Patients with Thoracic Aortic Disease. Circulation. 2010; 121: Y248-G500. Aortic aneurysm NOS (ICD10-I71.9). 2. Multiple redemonstrated right-sided rib fractures with associated trace pleural effusions. 3. New, subacute to chronic fracture deformities of the right pubis. There are new sclerotic although nonacute fracture deformities of the right hemisacrum. These fractures were not noted on prior examination dated 06/07/2019. 4. No CT findings of acute traumatic injury to the organs of the chest, abdomen, or pelvis. 5. Redemonstrated unusual pattern of pulmonary fibrosis, described in detail on same day prior examination of the chest. Please see prior examination for discussion. 6. Renal atrophy, in keeping with dialysis dependent ESRD. 7. Coronary artery disease. Aortic Atherosclerosis (ICD10-I70.0). Electronically Signed   By: Eddie Candle M.D.   On: 12/18/2019 13:13    Anti-infectives: Anti-infectives (From admission, onward)   None       Assessment/Plan GLF R 7-11th ribs - No PTX on CT. Multimodal pain control.  Pulm toilet. CXR this AM C7 vertebral body anterior wedging - Per NSGY. Dr. Reatha Armour. C-Collar. MRI yesterday showed this may not be acute. PT/OT Enlargement of thoracic aorta - EDP contacted [facility], Dr. Kipp Brood. Recommended f/u outpt. CT chest w/ contrast yesterday without evidence of dissection. Follow up outpatient.  RUQ Abdominal pain - CT A/P neagtive for intra-abdominal injury Left ankle pain - plain films negative Acute on chronic anemia - likely underlying anemia 2/2 CKD. Monitor. Stable Subacute to chronic fracture deformities of the right pubis - No acute fx. Trial of therapies  Nonacute fracture deformities of the right hemisacrum - No acute fx. Trial of therapies   Hx of MMP including but not limited to HTN, DM2, ESRF on HD. K 6.7 this AM. - Per primary/TRH. Per notes they have consulted Nephro and are arranging HD   FEN - Okay for a diet from our standpoint VTE - SCDs, okay for chemical prophylaxis from our standpoint. Will need clearance from NSGY before starting.  ID - None required from a trauma standpoint at this time  Foley - None   LOS: 1 day    Jillyn Ledger , [facility] 12/19/2019, 7:44 AM Please see Amion for pager number during day hours 7:00am-4:30pm

## 2019-12-19 NOTE — ED Notes (Signed)
PT in dialysis

## 2019-12-19 NOTE — ED Notes (Signed)
Date and time results received: 12/19/19 7:26 AM (use smartphrase ".now" to insert current time)  Test: potassium Critical Value: 6.7  Name of Provider Notified: Damita Dunnings, MD     Orders Received? Or Actions Taken?: scheduling HD

## 2019-12-19 NOTE — Procedures (Signed)
Interventional Radiology Procedure Note  Procedure: Placement of a right IJ approach temp trialysis HD catheter.  Tip is positioned at the superior cavoatrial junction and catheter is ready for immediate use.   May be converted if need be.  Complications: None  Recommendations:  - Ok to use - Do not submerge - Routine line care   Signed,  Dulcy Fanny. Earleen Newport, DO

## 2019-12-19 NOTE — ED Notes (Signed)
Sandwich bag and drink given to patient. Warm blankets given to patient.

## 2019-12-19 NOTE — Progress Notes (Signed)
   Providing Compassionate, Quality Care - Together  NEUROSURGERY PROGRESS NOTE   S: No issues overnight. No neck pain at this time  O: EXAM:  BP 99/62 (BP Location: Right Arm)   Pulse 70   Temp 98.3 F (36.8 C) (Oral)   Resp 14   Ht 5\' 7"  (1.702 m)   Wt 75 kg   SpO2 95%   BMI 25.90 kg/m   Awake, alert, oriented  Speech fluent, appropriate  CNs grossly intact  5/5 BUE/BLE  nml ROM C spine  ASSESSMENT:  74 y.o. female with  1.  C7 compression fracture, likely chronic 2.  Cervical spondylosis  Plan:  -mri reviewed, no acute surgical intervention -no collar needed -Okay for PT/OT/OOB -signing off, f/u 3 weeks in office   Thank you for allowing me to participate in this patient's care.  Please do not hesitate to call with questions or concerns.   Elwin Sleight, [facility] Cell: [phone]

## 2019-12-19 NOTE — Progress Notes (Signed)
Lokelma not on unit at time of transfer back to ED. Charge nurse, [place] made aware that patient has new orders to decrease high K+.

## 2019-12-19 NOTE — Progress Notes (Addendum)
Subjective: Patient continues to be in significant pain this AM. The pain remains in her right chest and underneath her breast and will radiate around to her right back. Patient reports that the pain medication does help.   Objective:  Vital signs in last 24 hours: Vitals:   12/19/19 0900 12/19/19 1100 12/19/19 1115 12/19/19 1306  BP: 130/78 (!) 154/132 (!) 141/67 99/62  Pulse: 73 66 76 70  Resp:   12 14  Temp: 98.3 F (36.8 C)     TempSrc: Oral     SpO2: 95% 100% 97% 95%  Weight:      Height:       Physical Exam Constitutional:      Comments: In C-collar and flat  HENT:     Head: Normocephalic and atraumatic.  Cardiovascular:     Rate and Rhythm: Normal rate and regular rhythm.  Pulmonary:     Effort: Pulmonary effort is normal.     Breath sounds: Normal breath sounds.     Comments: Pain in the R chest with deep breaths Abdominal:     General: Bowel sounds are normal.     Palpations: Abdomen is soft.     Tenderness: There is abdominal tenderness.     Comments: Generalized tenderness worse in the RUQ  Skin:    General: Skin is warm and dry.  Neurological:     Mental Status: She is alert.  Psychiatric:        Mood and Affect: Mood normal.        Behavior: Behavior normal.     Assessment/Plan:  Active Problems:   Fracture  Fractured ribs and Cervical vertebra Patient had a mechanical fall. She is in significant pain and was found to have displaced fractures of the posterior right 7th-11th ribs and a trace pleural effusion. MRI was not significant for any acute changes. C7 fracture appeared to be more chronic than originally thought. Neurosurgery has cleared patient of C-collar in bed. Trauma surgery does not believe the patient needs surgical intervention at this time. Per nephrology patient can take Ibuprofen. - Discontinue C-collar - Pain mgmt: Tylenol 650 q6 PRN,     Ibuprofen 600 q6h ,      Dilaudid 0.5mg  q4 h PRN       Lidocaine patch     Robaxin 500mg  q  6 PRN - Incentive spirometry - Pulm toilet - PT/OT  Fall Etiology for fall appears to be multifactorial at this time. Patient has a history of dizziness after HD. Patient also appears to require assistance with mobility at baseline. Patient reported that she normally waits for her aid to help her. BMP this morning noted hyperkalemia.  - F/u BMP - PT/OT - Discontinue gabapentin  HTN Patient has a history of HTN. Home medications: Norvasc 10mg , Labetalol 200mg  BID - NPO will restart medications when cleared for diet.  Mood disorder Patient home medication includes Lexapro 10mg . - Patient will be able to have diet after temporary cath placed  HLD Patient home med Lipitor 40mg  T, TH, S, Sun. - Continue regimen when patient is clear for diet  ESRD (MWF) Patient is ESRD with HD on MWF. Patient last went to HD on Friday. Patient Hgb stable. Patient was taken for urgent HD due to hyperkalemia however AVG noted to be clotted. IR will place a temp cath this PM, per nephrology. Nephrology is using temporizing measures to decrease K at this time. - Nephro has been consulted appreciate recc's - IR has been  consulted for temp cath placement  - trend CBC - NPO for temp cath  Freida Busman, MD 12/19/2019, 1:29 PM Pager: [phone] After 5pm on weekdays and 1pm on weekends: On Call pager [phone]

## 2019-12-19 NOTE — ED Notes (Signed)
Returned from dialysis

## 2019-12-19 NOTE — Progress Notes (Signed)
Pt noted with clotted AVG, unable to perform dialysis. Pt is to receive temp cath placed later today, new orders noted per Dr. Jonnie Finner. Charge nurse, Chad,RN made aware.

## 2019-12-19 NOTE — Consult Note (Signed)
[facility] Renal Consultation Note    Indication for Consultation:  Management of ESRD/hemodialysis, anemia, hypertension/volume, and secondary hyperparathyroidism. PCP:  HPI: Bianca Myers is a 74 y.o. female with ESRD, HTN, T2DM, and gout who was admitted with rib and cervical fracture and hyperkalemia.   Pt reports that she had a fall after dialysis on Friday afternoon. She doesn't remember what happened exactly. Her caregiver had gone inside and she tried to stand up and next thing she knew she was laying on the ground. She is not sure about LOC. Fell on her R side. Had initial pain, but worsened over time. Presented to ED on Sat afternoon.  In ED - her vitals were normal. Initial labs on 10/16 showed Na 130, K 5, CO2 27, WBC 9.6, Hgb 10.5. Imaging (xray/CTMRI) showed C7 compression Fx (?chronic), R 7-11th rib Fx, enlarging thoracic AA. Neurosurgery consulted - placed in cervical collar, no surgical intervention planned at this time. She was admitted OBS status for pain control.  Her labs this morning showed K 6.7 - we were called for dialysis. We brought her up for urgent dialysis - BUT unfortunately her LUE AVG is clotted - no bruit/thrill. Attempted cannulation - clots in needle. IR was consulted for temp line placement and K temporizing meds ordered.  She denies any dyspnea or CP at the moment. No N/V/D. Having R chest and leg pains from her fall. Is oriented/alert, MS appears normal.  Dialyzes at [facility] on MWF schedule - last HD Friday 10/15. Uses LUE AVG as her access.   Past Medical History:  Diagnosis Date  . Anemia   . Arthritis   . Chronic kidney disease (CKD) stage G5/A1, glomerular filtration rate (GFR) less than or equal to 15 mL/min/1.73 square meter and albuminuria creatinine ratio less than 30 mg/g (HCC)    Dialysis M/W/F  . Depression   . Diabetes mellitus ([place])   . Gout   . History of anemia due to chronic kidney disease   . History of  degenerative disc disease   . Hypertension   . Pneumonia   . Secondary hyperparathyroidism ([place])   . Spondylisthesis    Past Surgical History:  Procedure Laterality Date  . A/V FISTULAGRAM Left 12/16/2018   Procedure: A/V FISTULAGRAM;  Surgeon: Marty Heck, MD;  Location: [place] [facility];  Service: Cardiovascular;  Laterality: Left;  . A/V FISTULAGRAM Left 06/13/2019   Procedure: A/V FISTULAGRAM;  Surgeon: Marty Heck, MD;  Location: [place] [facility];  Service: Cardiovascular;  Laterality: Left;  . AV FISTULA PLACEMENT Left 08/24/2018   Procedure: ARTERIOVENOUS (AV) FISTULA CREATION;  Surgeon: Waynetta Sandy, MD;  Location: [place];  Service: Vascular;  Laterality: Left;  . AV FISTULA PLACEMENT Left 07/12/2019   Procedure: INSERTION OF LEFT UPPER ARM  ARTERIOVENOUS (AV) GORE-TEX GRAFT;  Surgeon: Elam Dutch, MD;  Location: [place];  Service: Vascular;  Laterality: Left;  . BASCILIC VEIN TRANSPOSITION Left 11/04/2018   Procedure: BASCILIC VEIN TRANSPOSITION SECOND STAGE;  Surgeon: Waynetta Sandy, MD;  Location: [place];  Service: Vascular;  Laterality: Left;  . COLONOSCOPY    . HIP SURGERY    . INSERTION OF DIALYSIS CATHETER N/A 06/15/2019   Procedure: INSERTION OF TUNNELED  DIALYSIS CATHETER RIGHT INTERNAL JUGULAR;  Surgeon: Serafina Mitchell, MD;  Location: [place];  Service: Vascular;  Laterality: N/A;  . IR FLUORO GUIDE CV LINE RIGHT  08/21/2018  . IR FLUORO GUIDE CV LINE RIGHT  06/08/2019  . IR REMOVAL TUN CV CATH W/O FL  03/11/2019  . IR US GUIDE VASC ACCESS RIGHT  08/21/2018  . IR US GUIDE VASC ACCESS RIGHT  06/08/2019  . PERIPHERAL VASCULAR BALLOON ANGIOPLASTY Left 06/13/2019   Procedure: PERIPHERAL VASCULAR BALLOON ANGIOPLASTY;  Surgeon: Marty Heck, MD;  Location: [place] [facility];  Service: Cardiovascular;  Laterality: Left;  AVF  . PERIPHERAL VASCULAR INTERVENTION Left 12/16/2018   Procedure: PERIPHERAL VASCULAR INTERVENTION;  Surgeon:  Marty Heck, MD;  Location: [place] [facility];  Service: Cardiovascular;  Laterality: Left;  arm fistula  . TOTAL HIP ARTHROPLASTY Left 06/10/2019   Procedure: TOTAL HIP ARTHROPLASTY ANTERIOR APPROACH;  Surgeon: Leandrew Koyanagi, MD;  Location: [place];  Service: Orthopedics;  Laterality: Left;   Family History  Family history unknown: Yes   Social History:  reports that she quit smoking about 48 years ago. She has never used smokeless tobacco. She reports previous alcohol use. She reports that she does not use drugs.  ROS: As per HPI otherwise negative.  Physical Exam: Vitals:   12/19/19 0400 12/19/19 0500 12/19/19 0600 12/19/19 0630  BP: (!) 141/86 123/78 (!) 148/89 132/90  Pulse:      Resp: 12 (!) 21 13   Temp:      TempSrc:      SpO2:      Weight:      Height:         General: Well developed, well nourished, in no acute distress. C-collar in place. Head: Normocephalic, atraumatic, sclera non-icteric, mucus membranes are moist. Neck: Supple without lymphadenopathy/masses. JVD not elevated. Lungs: Clear bilaterally to auscultation without wheezes, rales, or rhonchi. Breathing is unlabored. Heart: RRR with normal S1, S2. No murmurs, rubs, or gallops appreciated. Abdomen: Soft, non-tender, non-distended with normoactive bowel sounds. No rebound/guarding. Musculoskeletal:  Strength and tone appear normal for age. Lower extremities: No edema or ischemic changes, no open wounds. Neuro: Alert and oriented X 3. Moves all extremities spontaneously. Psych:  Responds to questions appropriately with a normal affect. Dialysis Access: LUE AVG without thrill/bruit  No Known Allergies Prior to Admission medications   Medication Sig Start Date End Date Taking? Authorizing Provider  acetaminophen (TYLENOL) 650 MG CR tablet Take 1,300 mg by mouth every 8 (eight) hours as needed for pain.   Yes [provider]  amLODipine (NORVASC) 10 MG tablet Take 1 tablet (10 mg total) by mouth  daily. 06/17/19  Yes Thurnell Lose, MD  Ascorbic Acid (VITAMIN C) 1000 MG tablet Take 1,000 mg by mouth 2 (two) times a day.    Yes [provider]  atorvastatin (LIPITOR) 10 MG tablet Take 10 mg by mouth every evening.  05/14/18  Yes [provider]  Cholecalciferol 100 MCG (4000 UT) CAPS Take 4,000 Units by mouth daily.   Yes [provider]  escitalopram (LEXAPRO) 10 MG tablet Take 10 mg by mouth daily. 05/09/19  Yes [provider]  ferric citrate (AURYXIA) 1 GM 210 MG(Fe) tablet Take 420 mg by mouth 3 (three) times daily with meals.   Yes [provider]  furosemide (LASIX) 40 MG tablet Take 1 tablet (40 mg total) by mouth every Tuesday, Thursday, Saturday, and Sunday. 09/04/18  Yes Nita Sells, MD  labetalol (NORMODYNE) 200 MG tablet Take 200 mg by mouth 2 (two) times daily.   Yes [provider]  lidocaine-prilocaine (EMLA) cream Apply 1 application topically as directed. Prior to Dialysis 02/11/19  Yes [provider]  loperamide (IMODIUM) 2 MG capsule Take 4 mg by mouth every 4 (four) hours as needed for diarrhea or loose stools.  03/07/19 03/05/20 Yes [provider]  multivitamin (RENA-VIT) TABS tablet Take 1 tablet by mouth at bedtime. 08/27/18  Yes Dhungel, Nishant, MD  Nutritional Supplements (FEEDING SUPPLEMENT, NEPRO CARB STEADY,) LIQD Take 240 mLs by mouth daily. 07/05/19  Yes [provider]  Omega-3 Fatty Acids (FISH OIL) 1000 MG CAPS Take 1,000 mg by mouth daily.    Yes [provider]  polyethylene glycol (MIRALAX) 17 g packet Take 17 g by mouth daily as needed for moderate constipation. Patient taking differently: Take 17 g by mouth daily as needed for mild constipation or moderate constipation.  08/27/18  Yes Dhungel, Nishant, MD  Turmeric 500 MG CAPS Take 500 mg by mouth daily.    Yes [provider]  enoxaparin (LOVENOX) 30 MG/0.3ML injection Inject 0.3 mLs (30 mg total) into the  skin daily. Patient not taking: Reported on 12/18/2019 06/10/19   Leandrew Koyanagi, MD  Oxycodone HCl 10 MG TABS Take 1 tablet (10 mg total) by mouth every 4 (four) hours as needed (pain). Patient not taking: Reported on 12/18/2019 07/12/19   Ulyses Amor, PA-C   Current Facility-Administered Medications  Medication Dose Route Frequency Provider Last Rate Last Admin  . acetaminophen (TYLENOL) suppository 650 mg  650 mg Rectal Q8H Andrew Au, MD       Or  . acetaminophen (TYLENOL) tablet 650 mg  650 mg Oral Q8H Andrew Au, MD   650 mg at 12/19/19 9509  . albuterol (PROVENTIL) (2.5 MG/3ML) 0.083% nebulizer solution 10 mg  10 mg Nebulization Once Roney Jaffe, MD      . Chlorhexidine Gluconate Cloth 2 % PADS 6 each  6 each Topical Q0600 Kristofer Schaffert, Woodfin Ganja, PA-C      . insulin aspart (novoLOG) injection 5 Units  5 Units Intravenous Once Roney Jaffe, MD       And  . dextrose 50 % solution 50 mL  1 ampule Intravenous Once Roney Jaffe, MD      . gabapentin (NEURONTIN) capsule 300 mg  300 mg Oral TID Madalyn Rob, MD   300 mg at 12/19/19 0906  . HYDROmorphone (DILAUDID) injection 0.5 mg  0.5 mg Intravenous Q3H PRN Madalyn Rob, MD   0.5 mg at 12/19/19 0232  . lidocaine (LIDODERM) 5 % 1 patch  1 patch Transdermal Q24H Madalyn Rob, MD   1 patch at 12/19/19 [phone]  . methocarbamol (ROBAXIN) tablet 500 mg  500 mg Oral Q6H PRN Madalyn Rob, MD      . oxyCODONE (Oxy IR/ROXICODONE) immediate release tablet 5-10 mg  5-10 mg Oral Q4H PRN Jillyn Ledger, PA-C   10 mg at 12/19/19 0905  . ramelteon (ROZEREM) tablet 8 mg  8 mg Oral QHS Andrew Au, MD   8 mg at 12/18/19 2318  . senna-docusate (Senokot-S) tablet 1 tablet  1 tablet Oral QHS PRN Madalyn Rob, MD      . sodium bicarbonate injection 50 mEq  50 mEq Intravenous Once Roney Jaffe, MD      . sodium zirconium cyclosilicate (LOKELMA) packet 10 g  10 g Oral Once Roney Jaffe, MD       Labs: Basic Metabolic Panel: Recent Labs   Lab 12/17/19 1954 12/19/19 0615  NA 130* 132*  K 5.0 6.7*  CL 88* 90*  CO2 27 25  GLUCOSE 181* 105*  BUN  45* 72*  CREATININE 6.25* 9.06*  CALCIUM 9.4 10.1   Liver Function Tests: Recent Labs  Lab 12/17/19 1954  AST 17  ALT 16  ALKPHOS 131*  BILITOT 0.5  PROT 7.2  ALBUMIN 3.6   CBC: Recent Labs  Lab 12/17/19 1954 12/19/19 0615  WBC 9.6 9.7  NEUTROABS 6.3  --   HGB 10.5* 10.2*  HCT 33.6* 32.4*  MCV 85.7 84.8  PLT 225 241   Studies/Results: DG Ribs Unilateral W/Chest Right  Result Date: 12/17/2019 CLINICAL DATA:  Fall with right-sided chest pain. EXAM: RIGHT RIBS AND CHEST - 3+ VIEW COMPARISON:  Chest radiograph and CT 06/07/2019 FINDINGS: The cardiomediastinal silhouette is unchanged with normal heart size. Aortic atherosclerosis is noted. Bilateral upper lobe scarring and asymmetric right upper lobe volume loss are similar to the prior radiograph. No acute airspace consolidation, edema, sizable pleural effusion, or pneumothorax is identified. There are minimally displaced right lateral sixth through ninth rib fractures. A vascular stent is noted in the left upper arm. IMPRESSION: Minimally displaced right lateral sixth through ninth rib fractures. Electronically Signed   By: Logan Bores M.D.   On: 12/17/2019 20:57   DG Ankle Complete Left  Result Date: 12/18/2019 CLINICAL DATA:  Left ankle pain. EXAM: LEFT ANKLE COMPLETE - 3+ VIEW COMPARISON:  None. FINDINGS: Bilateral soft tissue swelling. No fractures or dislocation. Vascular calcifications. IMPRESSION: Bilateral soft tissue swelling. No acute abnormalities otherwise seen. Electronically Signed   By: Dorise Bullion III M.D   On: 12/18/2019 12:48   CT Head Wo Contrast  Result Date: 12/18/2019 CLINICAL DATA:  Pain following fall EXAM: CT HEAD WITHOUT CONTRAST CT CERVICAL SPINE WITHOUT CONTRAST TECHNIQUE: Multidetector CT imaging of the head and cervical spine was performed following the standard protocol without  intravenous contrast. Multiplanar CT image reconstructions of the cervical spine were also generated. COMPARISON:  CT head and CT cervical spine June 07, 2019 FINDINGS: CT HEAD FINDINGS Brain: There is stable age related volume loss. Stable mild invagination of CSF into the sella. There is no intracranial mass, hemorrhage, extra-axial fluid collection, or midline shift. There is patchy small vessel disease in the centra semiovale bilaterally, stable. There is small vessel disease in the anterior limbs of each internal and external capsule. No evident acute infarct. Vascular: No appreciable hyperdense vessels. There is calcification in the distal vertebral arteries and carotid siphon regions bilaterally. Skull: The bony calvarium appears intact. Sinuses/Orbits: There is mild mucosal thickening in several ethmoid air cells. Other visualized paranasal sinuses are clear. Orbits appear symmetric bilaterally. Other: Mastoid air cells are clear. CT CERVICAL SPINE FINDINGS Alignment: There is no appreciable spondylolisthesis. Skull base and vertebrae: Skull base and craniocervical junction regions appear normal. There is anterior wedging of the C7 vertebral body which was not present previously and appears potentially acute. There is no retropulsion of bone in this area. There is no other evidence suggesting fracture. No blastic or lytic bone lesions. Soft tissues and spinal canal: Prevertebral soft tissues and predental space regions are normal. No cord or canal hematoma. No paraspinous lesions are evident. Disc levels: There is moderate disc space narrowing at C4-5 with milder disc space narrowing at C6-7, stable. There is facet hypertrophy at multiple levels bilaterally. There is impression on exiting nerve roots at C5-6 on the right and at C6-7 bilaterally. No frank disc extrusion or stenosis. Upper chest: There is scarring in each upper lobe, stable Other: There is calcification in each carotid artery. IMPRESSION: CT  head: Age  related volume loss. Patchy supratentorial small vessel disease, stable. No acute infarct. No mass or hemorrhage. Foci of arterial vascular calcification noted. Mucosal thickening in several ethmoid air cells noted. CT cervical spine: 1. Anterior wedging of the C7 vertebral body, a recent and probably acute. No retropulsion of bone. No other fracture evident. No spondylolisthesis. 2. Multilevel osteoarthritic change with exit foraminal narrowing impressing on exiting nerve roots on the right at C5-6 and C6-7 bilaterally. No frank disc extrusion or stenosis. 3.  Stable upper lobe parenchymal lung scarring. 4.  Carotid artery calcification bilaterally. Critical Value/emergent results were called by telephone at the time of interpretation on 12/18/2019 at 10:12 am to provider Krista Blue, PA, who verbally acknowledged these results. Electronically Signed   By: Lowella Grip III M.D.   On: 12/18/2019 10:14   CT Chest Wo Contrast  Result Date: 12/18/2019 CLINICAL DATA:  Rib fracture suspected, traumatic EXAM: CT CHEST WITHOUT CONTRAST TECHNIQUE: Multidetector CT imaging of the chest was performed following the standard protocol without IV contrast. COMPARISON:  06/07/2019 FINDINGS: Cardiovascular: Aortic atherosclerosis. New enlargement of the tubular ascending thoracic aorta, measuring up to 4.0 x 4.0 cm. Normal heart size. Three-vessel coronary artery calcifications. No pericardial effusion. Mediastinum/Nodes: No enlarged mediastinal, hilar, or axillary lymph nodes. Thyroid gland, trachea, and esophagus demonstrate no significant findings. Lungs/Pleura: There is a redemonstrated pattern of pulmonary fibrosis featuring unusual, upper lobe predominant peribronchovascular areas of fibrotic consolidation, architectural distortion, bronchiectasis, and bronchiolectasis (series 4, image 34, 52). Mild centrilobular emphysema. Trace right pleural effusion. Upper Abdomen: No acute abnormality. Large cyst or  hemangioma of the superior spleen, not significant changed compared to prior examination. Atrophic kidneys, partially imaged. Musculoskeletal: No chest wall mass or suspicious bone lesions identified. There are minimally displaced fractures of the posterior right seventh through eleventh ribs. IMPRESSION: 1. There are minimally displaced fractures of the posterior right seventh through eleventh ribs. Trace right pleural effusion. No pneumothorax. 2. New enlargement of the tubular ascending thoracic aorta, measuring up to 4.0 x 4.0 cm, somewhat concerning for acute aortic injury in the setting of trauma. Consider contrast enhanced CT angiogram to further evaluate. Aortic Atherosclerosis (ICD10-I70.0) 3. There is a redemonstrated pattern of pulmonary fibrosis featuring unusual, upper lobe predominant peribronchovascular areas of fibrotic consolidation, architectural distortion, bronchiectasis, and bronchiolectasis. Findings are in keeping with an "alternative diagnosis" pattern of fibrosis by ATS pulmonary fibrosis criteria, primary differential consideration chronic fibrotic hypersensitivity pneumonitis or other inflammatory inhalational lung disease, or alternately pulmonary sarcoidosis despite the absence of nodularity or mediastinal lymphadenopathy. 4. Mild emphysema.  Emphysema (ICD10-J43.9). 5. Coronary artery disease. Electronically Signed   By: Eddie Candle M.D.   On: 12/18/2019 10:17   CT Cervical Spine Wo Contrast  Result Date: 12/18/2019 CLINICAL DATA:  Pain following fall EXAM: CT HEAD WITHOUT CONTRAST CT CERVICAL SPINE WITHOUT CONTRAST TECHNIQUE: Multidetector CT imaging of the head and cervical spine was performed following the standard protocol without intravenous contrast. Multiplanar CT image reconstructions of the cervical spine were also generated. COMPARISON:  CT head and CT cervical spine June 07, 2019 FINDINGS: CT HEAD FINDINGS Brain: There is stable age related volume loss. Stable mild  invagination of CSF into the sella. There is no intracranial mass, hemorrhage, extra-axial fluid collection, or midline shift. There is patchy small vessel disease in the centra semiovale bilaterally, stable. There is small vessel disease in the anterior limbs of each internal and external capsule. No evident acute infarct. Vascular: No appreciable hyperdense vessels. There is calcification in  the distal vertebral arteries and carotid siphon regions bilaterally. Skull: The bony calvarium appears intact. Sinuses/Orbits: There is mild mucosal thickening in several ethmoid air cells. Other visualized paranasal sinuses are clear. Orbits appear symmetric bilaterally. Other: Mastoid air cells are clear. CT CERVICAL SPINE FINDINGS Alignment: There is no appreciable spondylolisthesis. Skull base and vertebrae: Skull base and craniocervical junction regions appear normal. There is anterior wedging of the C7 vertebral body which was not present previously and appears potentially acute. There is no retropulsion of bone in this area. There is no other evidence suggesting fracture. No blastic or lytic bone lesions. Soft tissues and spinal canal: Prevertebral soft tissues and predental space regions are normal. No cord or canal hematoma. No paraspinous lesions are evident. Disc levels: There is moderate disc space narrowing at C4-5 with milder disc space narrowing at C6-7, stable. There is facet hypertrophy at multiple levels bilaterally. There is impression on exiting nerve roots at C5-6 on the right and at C6-7 bilaterally. No frank disc extrusion or stenosis. Upper chest: There is scarring in each upper lobe, stable Other: There is calcification in each carotid artery. IMPRESSION: CT head: Age related volume loss. Patchy supratentorial small vessel disease, stable. No acute infarct. No mass or hemorrhage. Foci of arterial vascular calcification noted. Mucosal thickening in several ethmoid air cells noted. CT cervical spine: 1.  Anterior wedging of the C7 vertebral body, a recent and probably acute. No retropulsion of bone. No other fracture evident. No spondylolisthesis. 2. Multilevel osteoarthritic change with exit foraminal narrowing impressing on exiting nerve roots on the right at C5-6 and C6-7 bilaterally. No frank disc extrusion or stenosis. 3.  Stable upper lobe parenchymal lung scarring. 4.  Carotid artery calcification bilaterally. Critical Value/emergent results were called by telephone at the time of interpretation on 12/18/2019 at 10:12 am to provider Krista Blue, PA, who verbally acknowledged these results. Electronically Signed   By: Lowella Grip III M.D.   On: 12/18/2019 10:14   MR CERVICAL SPINE WO CONTRAST  Result Date: 12/18/2019 CLINICAL DATA:  Spinal stenosis. Multiple acute right-sided rib fractures after a fall. EXAM: MRI CERVICAL SPINE WITHOUT CONTRAST TECHNIQUE: Multiplanar, multisequence MR imaging of the cervical spine was performed. No intravenous contrast was administered. COMPARISON:  None. FINDINGS: Despite efforts by the technologist and patient, motion artifact is present on today's exam and could not be eliminated. This reduces exam sensitivity and specificity. Alignment: No vertebral subluxation is observed. Vertebrae: Mild anterior wedging at C7 is present. I do not see significant edema signal in the C7 vertebral body on STIR images, but there is a band of low T1 and T2 signal centrally in the C7 vertebral body (no corresponding visible fracture on the CT). Also, while the degree of anterior wedging is similar between the 06/07/2019 exam and the 01/18/2020 CT examination, the degree of sclerosis along the superior endplate of C7 may be minimally increased, although this is equivocal. Irregularity along the anterior superior endplate of C7 appears to be stable between the 2 CT examinations. There is also mild superior endplate concavity at the T2 vertebral level, not changed from 06/07/2019,  an without vertebral edema. Disc desiccation is present throughout the cervical spine. Cord: Taking into account the degree of motion artifact, no abnormal cord signal is identified. Posterior Fossa, vertebral arteries, paraspinal tissues: Partially empty sella. Disc levels: C2-3: Unremarkable C3-4: Mild central narrowing of the thecal sac and borderline bilateral foraminal stenosis due to central disc protrusion, uncinate spurring, and facet arthropathy. C4-5:  Prominent left and moderate right foraminal stenosis and moderate central narrowing of the thecal sac due to intervertebral spurring, disc bulge, uncinate spurring, and left facet arthropathy. C5-6: Moderate bilateral foraminal stenosis and moderate central narrowing of the thecal sac due to right eccentric disc bulge, intervertebral spurring, and facet arthropathy. C6-7: Moderate left and mild right foraminal stenosis due to disc bulge, uncinate spurring, and facet arthropathy. C7-T1: Unremarkable. IMPRESSION: 1. There is no edema associated with the anterior wedging of C7. Retrospectively a roughly similar appearance is present on 06/07/2019 compared to today's CT, with mild sclerosis along the superior endplate and mild anterior wedging with the vertical height of the vertebral body being about 1.0 cm in the area of wedging. Accordingly I am skeptical of acute fracture, and I suspect this appearance is due to chronic subsidence. 2. Cervical spondylosis and degenerative disc disease, causing multilevel impingement as detailed above. 3. Partially empty sella. 4. Despite efforts by the technologist and patient, motion artifact is present on today's exam and could not be eliminated. This reduces exam sensitivity and specificity. Electronically Signed   By: Van Clines M.D.   On: 12/18/2019 15:23   CT CHEST ABDOMEN PELVIS W CONTRAST  Result Date: 12/18/2019 CLINICAL DATA:  Trauma EXAM: CT CHEST, ABDOMEN, AND PELVIS WITH CONTRAST TECHNIQUE:  Multidetector CT imaging of the chest, abdomen and pelvis was performed following the standard protocol during bolus administration of intravenous contrast. CONTRAST:  140mL OMNIPAQUE IOHEXOL 300 MG/ML  SOLN COMPARISON:  Same day CT chest, 12/18/2019, CT chest abdomen pelvis, 06/07/2019 FINDINGS: CT CHEST FINDINGS Cardiovascular: Aortic atherosclerosis. As noted on prior examination, there is new enlargement of the tubular ascending thoracic aorta, measuring 4.0 x 3.9 cm, however without evidence of dissection or other acute pathology. Normal heart size. Three-vessel coronary artery calcification. No pericardial effusion. Mediastinum/Nodes: No enlarged mediastinal, hilar, or axillary lymph nodes. Thyroid gland, trachea, and esophagus demonstrate no significant findings. Lungs/Pleura: Trace right pleural effusion. Redemonstrated pattern of pulmonary fibrosis, described in detail on prior examination although featuring upper lobe predominant peribronchovascular areas of fibrotic consolidation, architectural distortion, bronchiectasis, bronchiolectasis. Mild centrilobular emphysema. Musculoskeletal: No chest wall mass or suspicious bone lesions identified. Redemonstrated minimally displaced fractures of the posterior right seventh through eleventh ribs, and additionally minimally fractures of the lateral right sixth through eighth ribs, not previously noted. CT ABDOMEN PELVIS FINDINGS Hepatobiliary: No solid liver abnormality is seen. Distended gallbladder. Mild biliary ductal dilatation to the ampulla, CBD measuring up to 9 mm in caliber. No obstructing calculi identified. This appearance is similar to prior examination dated 06/07/2019. Pancreas: Unremarkable. No pancreatic ductal dilatation or surrounding inflammatory changes. Spleen: Redemonstrated incidental simple cyst of the superior spleen measuring 5.3 cm. Adrenals/Urinary Tract: Adrenal glands are unremarkable. Atrophic kidneys. No hydronephrosis. Bladder is  unremarkable. Stomach/Bowel: Stomach is within normal limits. Appendix appears normal. No evidence of bowel wall thickening, distention, or inflammatory changes. Sigmoid diverticulosis. Vascular/Lymphatic: Aortic atherosclerosis. No enlarged abdominal or pelvic lymph nodes. Reproductive: Calcified uterine fibroids. Other: No abdominal wall hernia or abnormality. No abdominopelvic ascites. Musculoskeletal: Plate and screw fixation of the posterior right acetabulum. Status post left hip total arthroplasty. There are new, subacute to chronic fracture deformities of the right pubis (series 6, image 39). There are new sclerotic although nonacute fracture deformities of the right hemi sacrum (series 3, image 80). Focally very severe arthrosis of L5-S1. IMPRESSION: 1. 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 Recommend annual imaging followup by CTA or MRA. This recommendation follows 2010 ACCF/AHA/AATS/ACR/ASA/SCA/SCAI/SIR/STS/SVM Guidelines for the Diagnosis and Management of Patients with Thoracic Aortic Disease. Circulation. 2010; 121: A834-H962. Aortic aneurysm NOS (ICD10-I71.9). 2. Multiple redemonstrated right-sided rib fractures with associated trace pleural effusions. 3. New, subacute to chronic fracture deformities of the right pubis. There are new sclerotic although nonacute fracture deformities of the right hemisacrum. These fractures were not noted on prior examination dated 06/07/2019. 4. No CT findings of acute traumatic injury to the organs of the chest, abdomen, or pelvis. 5. Redemonstrated unusual pattern of pulmonary fibrosis, described in detail on same day prior examination of the chest. Please see prior examination for discussion. 6. Renal atrophy, in keeping with dialysis dependent ESRD. 7. Coronary artery disease. Aortic  Atherosclerosis (ICD10-I70.0). Electronically Signed   By: Eddie Candle M.D.   On: 12/18/2019 13:13    Dialysis Orders:  MWF at [facility] 3:45hr, 400/800, EDW 70.5kg, 2K/2.25Ca, LUE AVG, no heparin - Mircera 134mcg IV q 2 weeks - Calcitriol 0.63mcg PO q HD  Assessment/Plan: 1.  C7 + R 7-11th rib fractures: Pain control per primary. Cervical Fx may be chronic in nature. No surgical plans for now. 2. Fall/?possible syncopal episode: Was after dialysis, possibly mild volume depletion. Per primary. 3. Enlarged thoracic aorta: Will f/u with [facility] as outpatient. 4. Hyperkalemia: K 6.7 today. EKG without peaked T waves. Attempted to bring up for urgent HD but her AVG is clotted unfortunately. Contacted IR for temp catheter placement, they should be able to accommodate this afternoon. Giving K temporizing meds for now and will bring back for dialysis after line placement. 5. Clotted LUE AVG: See above. For temp line + HD today, then declot when IR able schedule-wise. 6.  ESRD: Usual MWF schedule - HD today after line. 7.  Hypertension/volume: BP controlled - minimal edema. UF as tolerated. 8.  Anemia: Hgb 10.2 - not due for ESA yet. 9.  Metabolic bone disease: Ca > 10, hold VRDA for now.  10.  T2DM: Per primary.  Veneta Penton, PA-C 12/19/2019, 9:38 AM  [employer]

## 2019-12-20 ENCOUNTER — Encounter (HOSPITAL_COMMUNITY): Payer: Self-pay | Admitting: Internal Medicine

## 2019-12-20 DIAGNOSIS — I712 Thoracic aortic aneurysm, without rupture: Secondary | ICD-10-CM

## 2019-12-20 LAB — CBC WITH DIFFERENTIAL/PLATELET
Abs Immature Granulocytes: 0.04 10*3/uL (ref 0.00–0.07)
Basophils Absolute: 0 10*3/uL (ref 0.0–0.1)
Basophils Relative: 0 %
Eosinophils Absolute: 0.2 10*3/uL (ref 0.0–0.5)
Eosinophils Relative: 2 %
HCT: 29.2 % — ABNORMAL LOW (ref 36.0–46.0)
Hemoglobin: 9.2 g/dL — ABNORMAL LOW (ref 12.0–15.0)
Immature Granulocytes: 0 %
Lymphocytes Relative: 17 %
Lymphs Abs: 1.7 10*3/uL (ref 0.7–4.0)
MCH: 26.7 pg (ref 26.0–34.0)
MCHC: 31.5 g/dL (ref 30.0–36.0)
MCV: 84.6 fL (ref 80.0–100.0)
Monocytes Absolute: 1 10*3/uL (ref 0.1–1.0)
Monocytes Relative: 10 %
Neutro Abs: 7.2 10*3/uL (ref 1.7–7.7)
Neutrophils Relative %: 71 %
Platelets: 197 10*3/uL (ref 150–400)
RBC: 3.45 MIL/uL — ABNORMAL LOW (ref 3.87–5.11)
RDW: 18 % — ABNORMAL HIGH (ref 11.5–15.5)
WBC: 10.1 10*3/uL (ref 4.0–10.5)
nRBC: 0.5 % — ABNORMAL HIGH (ref 0.0–0.2)

## 2019-12-20 LAB — BASIC METABOLIC PANEL
Anion gap: 15 (ref 5–15)
BUN: 36 mg/dL — ABNORMAL HIGH (ref 8–23)
CO2: 27 mmol/L (ref 22–32)
Calcium: 8.7 mg/dL — ABNORMAL LOW (ref 8.9–10.3)
Chloride: 91 mmol/L — ABNORMAL LOW (ref 98–111)
Creatinine, Ser: 6.37 mg/dL — ABNORMAL HIGH (ref 0.44–1.00)
GFR, Estimated: 6 mL/min — ABNORMAL LOW (ref >60)
Glucose, Bld: 122 mg/dL — ABNORMAL HIGH (ref 70–99)
Potassium: 4.4 mmol/L (ref 3.5–5.1)
Sodium: 133 mmol/L — ABNORMAL LOW (ref 135–145)

## 2019-12-20 MED ORDER — HYDROMORPHONE HCL 2 MG PO TABS
1.0000 mg | ORAL_TABLET | ORAL | Status: DC | PRN
  Administered 2019-12-20 – 2019-12-22 (×2): 1 mg via ORAL
  Filled 2019-12-20 (×2): qty 1

## 2019-12-20 MED ORDER — OXYCODONE HCL 5 MG PO TABS
5.0000 mg | ORAL_TABLET | ORAL | Status: DC | PRN
  Administered 2019-12-20 – 2019-12-23 (×11): 5 mg via ORAL
  Filled 2019-12-20 (×11): qty 1

## 2019-12-20 MED ORDER — ESCITALOPRAM OXALATE 10 MG PO TABS
10.0000 mg | ORAL_TABLET | Freq: Every day | ORAL | Status: DC
  Administered 2019-12-20 – 2019-12-26 (×6): 10 mg via ORAL
  Filled 2019-12-20 (×6): qty 1

## 2019-12-20 MED ORDER — HYDROMORPHONE HCL 2 MG PO TABS: 1.0000 mg | ORAL_TABLET | ORAL | Status: DC | PRN

## 2019-12-20 MED ORDER — INFLUENZA VAC A&B SA ADJ QUAD 0.5 ML IM PRSY
0.5000 mL | PREFILLED_SYRINGE | INTRAMUSCULAR | Status: DC
  Filled 2019-12-20: qty 0.5

## 2019-12-20 MED ORDER — FENTANYL CITRATE (PF) 100 MCG/2ML IJ SOLN
12.5000 ug | Freq: Once | INTRAMUSCULAR | Status: AC
  Administered 2019-12-20: 12.5 ug via INTRAVENOUS
  Filled 2019-12-20: qty 2

## 2019-12-20 MED ORDER — AMLODIPINE BESYLATE 10 MG PO TABS
10.0000 mg | ORAL_TABLET | Freq: Every day | ORAL | Status: DC
  Administered 2019-12-20 – 2019-12-22 (×2): 10 mg via ORAL
  Filled 2019-12-20 (×2): qty 1

## 2019-12-20 NOTE — ED Notes (Signed)
Pt resting well with eyes closed. VSS. Will continue to monitor.

## 2019-12-20 NOTE — ED Notes (Signed)
Meal tray given to pt.

## 2019-12-20 NOTE — ED Notes (Signed)
Pt sitting up in bed crying. Pt in extreme pain. Will page md.

## 2019-12-20 NOTE — ED Notes (Signed)
PAGED Agyei TO RN CANDY--[place] Mitch

## 2019-12-20 NOTE — Consult Note (Signed)
Chief Complaint: AVG clotted. Request is for AVG declot  Referring Physician(s): K. Stovall PA  Supervising Physician: Aletta Edouard  Patient Status: [facility] - ED  History of Present Illness: Bianca Myers is a 74 y.o. female History of ESRD on HD arrived to the ED at [facility] with injuries related to a fall from home. Found to have multiple rib fractures and a C7 fracture. Patient has a left upper arm AV gorex graft placed on 5.11.21 and a previous nonfunctional left basilic vein fistula created on 6.23.20 with second stage on 9.3.21. Team is concerned that the graft is clotted after pulling clots during last dialysis session. Graft has no bruit and no thrill. Team is requesting a fistulogram of the left upper arm gore tex graft.   Past Medical History:  Diagnosis Date  . Anemia   . Arthritis   . Chronic kidney disease (CKD) stage G5/A1, glomerular filtration rate (GFR) less than or equal to 15 mL/min/1.73 square meter and albuminuria creatinine ratio less than 30 mg/g (HCC)    Dialysis M/W/F  . Depression   . Diabetes mellitus ([place])   . Gout   . History of anemia due to chronic kidney disease   . History of degenerative disc disease   . Hypertension   . Pneumonia   . Secondary hyperparathyroidism ([place])   . Spondylisthesis     Past Surgical History:  Procedure Laterality Date  . A/V FISTULAGRAM Left 12/16/2018   Procedure: A/V FISTULAGRAM;  Surgeon: Marty Heck, MD;  Location: [place] [facility];  Service: Cardiovascular;  Laterality: Left;  . A/V FISTULAGRAM Left 06/13/2019   Procedure: A/V FISTULAGRAM;  Surgeon: Marty Heck, MD;  Location: [place] [facility];  Service: Cardiovascular;  Laterality: Left;  . AV FISTULA PLACEMENT Left 08/24/2018   Procedure: ARTERIOVENOUS (AV) FISTULA CREATION;  Surgeon: Waynetta Sandy, MD;  Location: [place];  Service: Vascular;  Laterality: Left;  . AV FISTULA PLACEMENT Left 07/12/2019   Procedure: INSERTION OF LEFT  UPPER ARM  ARTERIOVENOUS (AV) GORE-TEX GRAFT;  Surgeon: Elam Dutch, MD;  Location: [place];  Service: Vascular;  Laterality: Left;  . BASCILIC VEIN TRANSPOSITION Left 11/04/2018   Procedure: BASCILIC VEIN TRANSPOSITION SECOND STAGE;  Surgeon: Waynetta Sandy, MD;  Location: [place];  Service: Vascular;  Laterality: Left;  . COLONOSCOPY    . HIP SURGERY    . INSERTION OF DIALYSIS CATHETER N/A 06/15/2019   Procedure: INSERTION OF TUNNELED  DIALYSIS CATHETER RIGHT INTERNAL JUGULAR;  Surgeon: Serafina Mitchell, MD;  Location: [place];  Service: Vascular;  Laterality: N/A;  . IR FLUORO GUIDE CV LINE RIGHT  08/21/2018  . IR FLUORO GUIDE CV LINE RIGHT  06/08/2019  . IR PERC TUN PERIT CATH WO PORT S&I Dartha Lodge  12/19/2019  . IR REMOVAL TUN CV CATH W/O FL  03/11/2019  . IR US GUIDE VASC ACCESS RIGHT  08/21/2018  . IR US GUIDE VASC ACCESS RIGHT  06/08/2019  . IR US GUIDE VASC ACCESS RIGHT  12/19/2019  . PERIPHERAL VASCULAR BALLOON ANGIOPLASTY Left 06/13/2019   Procedure: PERIPHERAL VASCULAR BALLOON ANGIOPLASTY;  Surgeon: Marty Heck, MD;  Location: [place] [facility];  Service: Cardiovascular;  Laterality: Left;  AVF  . PERIPHERAL VASCULAR INTERVENTION Left 12/16/2018   Procedure: PERIPHERAL VASCULAR INTERVENTION;  Surgeon: Marty Heck, MD;  Location: [place] [facility];  Service: Cardiovascular;  Laterality: Left;  arm fistula  . TOTAL HIP ARTHROPLASTY Left 06/10/2019   Procedure: TOTAL  HIP ARTHROPLASTY ANTERIOR APPROACH;  Surgeon: Leandrew Koyanagi, MD;  Location: [place];  Service: Orthopedics;  Laterality: Left;    Allergies: Patient has no known allergies.  Medications: Prior to Admission medications   Medication Sig Start Date End Date Taking? Authorizing Provider  acetaminophen (TYLENOL) 650 MG CR tablet Take 1,300 mg by mouth every 8 (eight) hours as needed for pain.   Yes [provider]  amLODipine (NORVASC) 10 MG tablet Take 1 tablet (10 mg total) by mouth daily. 06/17/19   Yes Thurnell Lose, MD  Ascorbic Acid (VITAMIN C) 1000 MG tablet Take 1,000 mg by mouth 2 (two) times a day.    Yes [provider]  atorvastatin (LIPITOR) 10 MG tablet Take 10 mg by mouth every evening.  05/14/18  Yes [provider]  Cholecalciferol 100 MCG (4000 UT) CAPS Take 4,000 Units by mouth daily.   Yes [provider]  escitalopram (LEXAPRO) 10 MG tablet Take 10 mg by mouth daily. 05/09/19  Yes [provider]  ferric citrate (AURYXIA) 1 GM 210 MG(Fe) tablet Take 420 mg by mouth 3 (three) times daily with meals.   Yes [provider]  furosemide (LASIX) 40 MG tablet Take 1 tablet (40 mg total) by mouth every Tuesday, Thursday, Saturday, and Sunday. 09/04/18  Yes Nita Sells, MD  labetalol (NORMODYNE) 200 MG tablet Take 200 mg by mouth 2 (two) times daily.   Yes [provider]  lidocaine-prilocaine (EMLA) cream Apply 1 application topically as directed. Prior to Dialysis 02/11/19  Yes [provider]  loperamide (IMODIUM) 2 MG capsule Take 4 mg by mouth every 4 (four) hours as needed for diarrhea or loose stools.  03/07/19 03/05/20 Yes [provider]  multivitamin (RENA-VIT) TABS tablet Take 1 tablet by mouth at bedtime. 08/27/18  Yes Dhungel, Nishant, MD  Nutritional Supplements (FEEDING SUPPLEMENT, NEPRO CARB STEADY,) LIQD Take 240 mLs by mouth daily. 07/05/19  Yes [provider]  Omega-3 Fatty Acids (FISH OIL) 1000 MG CAPS Take 1,000 mg by mouth daily.    Yes [provider]  polyethylene glycol (MIRALAX) 17 g packet Take 17 g by mouth daily as needed for moderate constipation. Patient taking differently: Take 17 g by mouth daily as needed for mild constipation or moderate constipation.  08/27/18  Yes Dhungel, Nishant, MD  Turmeric 500 MG CAPS Take 500 mg by mouth daily.    Yes [provider]  enoxaparin (LOVENOX) 30 MG/0.3ML injection Inject 0.3 mLs (30 mg total) into the skin  daily. Patient not taking: Reported on 12/18/2019 06/10/19   Leandrew Koyanagi, MD  Oxycodone HCl 10 MG TABS Take 1 tablet (10 mg total) by mouth every 4 (four) hours as needed (pain). Patient not taking: Reported on 12/18/2019 07/12/19   Ulyses Amor, PA-C     Family History  Family history unknown: Yes    Social History   Socioeconomic History  . Marital status: Widowed    Spouse name: Not on file  . Number of children: Not on file  . Years of education: Not on file  . Highest education level: Not on file  Occupational History  . Not on file  Tobacco Use  . Smoking status: Former Smoker    Quit date: 1973    Years since quitting: 48.8  . Smokeless tobacco: Never Used  Vaping Use  . Vaping Use: Never used  Substance and Sexual Activity  . Alcohol use: Not Currently  . Drug use:  Never  . Sexual activity: Not on file  Other Topics Concern  . Not on file  Social History Narrative  . Not on file   Social Determinants of Health   Financial Resource Strain:   . Difficulty of Paying Living Expenses: Not on file  Food Insecurity:   . Worried About Charity fundraiser in the Last Year: Not on file  . Ran Out of Food in the Last Year: Not on file  Transportation Needs:   . Lack of Transportation (Medical): Not on file  . Lack of Transportation (Non-Medical): Not on file  Physical Activity:   . Days of Exercise per Week: Not on file  . Minutes of Exercise per Session: Not on file  Stress:   . Feeling of Stress : Not on file  Social Connections:   . Frequency of Communication with Friends and Family: Not on file  . Frequency of Social Gatherings with Friends and Family: Not on file  . Attends Religious Services: Not on file  . Active Member of Clubs or Organizations: Not on file  . Attends Archivist Meetings: Not on file  . Marital Status: Not on file    Review of Systems: A 12 point ROS discussed and pertinent positives are indicated in the HPI above.  All  other systems are negative.  Review of Systems  Constitutional: Negative for fatigue and fever.  HENT: Negative for congestion.   Respiratory: Negative for cough and shortness of breath.   Cardiovascular: Positive for chest pain ( secondary to rib fractures).  Gastrointestinal: Positive for abdominal pain ( secondary to rib fractures). Negative for diarrhea, nausea and vomiting.    Vital Signs: BP 128/73   Pulse 75   Temp 98.6 F (37 C) (Oral)   Resp 14   Ht 5' 7"  (1.702 m)   Wt 165 lb 5.5 oz (75 kg)   SpO2 100%   BMI 25.90 kg/m   Physical Exam Vitals and nursing note reviewed.  Constitutional:      Appearance: She is well-developed.  HENT:     Head: Normocephalic and atraumatic.  Eyes:     Conjunctiva/sclera: Conjunctivae normal.  Cardiovascular:     Rate and Rhythm: Normal rate and regular rhythm.     Heart sounds: Normal heart sounds.     Comments: LUE graft -/- Pulmonary:     Effort: Pulmonary effort is normal.  Musculoskeletal:        General: Normal range of motion.     Cervical back: Normal range of motion.  Skin:    General: Skin is warm.  Neurological:     Mental Status: She is alert and oriented to person, place, and time.     Imaging: DG Ribs Unilateral W/Chest Right  Result Date: 12/17/2019 CLINICAL DATA:  Fall with right-sided chest pain. EXAM: RIGHT RIBS AND CHEST - 3+ VIEW COMPARISON:  Chest radiograph and CT 06/07/2019 FINDINGS: The cardiomediastinal silhouette is unchanged with normal heart size. Aortic atherosclerosis is noted. Bilateral upper lobe scarring and asymmetric right upper lobe volume loss are similar to the prior radiograph. No acute airspace consolidation, edema, sizable pleural effusion, or pneumothorax is identified. There are minimally displaced right lateral sixth through ninth rib fractures. A vascular stent is noted in the left upper arm. IMPRESSION: Minimally displaced right lateral sixth through ninth rib fractures.  Electronically Signed   By: Logan Bores M.D.   On: 12/17/2019 20:57   DG Ankle Complete Left  Result Date: 12/18/2019  CLINICAL DATA:  Left ankle pain. EXAM: LEFT ANKLE COMPLETE - 3+ VIEW COMPARISON:  None. FINDINGS: Bilateral soft tissue swelling. No fractures or dislocation. Vascular calcifications. IMPRESSION: Bilateral soft tissue swelling. No acute abnormalities otherwise seen. Electronically Signed   By: Dorise Bullion III M.D   On: 12/18/2019 12:48   CT Head Wo Contrast  Result Date: 12/18/2019 CLINICAL DATA:  Pain following fall EXAM: CT HEAD WITHOUT CONTRAST CT CERVICAL SPINE WITHOUT CONTRAST TECHNIQUE: Multidetector CT imaging of the head and cervical spine was performed following the standard protocol without intravenous contrast. Multiplanar CT image reconstructions of the cervical spine were also generated. COMPARISON:  CT head and CT cervical spine June 07, 2019 FINDINGS: CT HEAD FINDINGS Brain: There is stable age related volume loss. Stable mild invagination of CSF into the sella. There is no intracranial mass, hemorrhage, extra-axial fluid collection, or midline shift. There is patchy small vessel disease in the centra semiovale bilaterally, stable. There is small vessel disease in the anterior limbs of each internal and external capsule. No evident acute infarct. Vascular: No appreciable hyperdense vessels. There is calcification in the distal vertebral arteries and carotid siphon regions bilaterally. Skull: The bony calvarium appears intact. Sinuses/Orbits: There is mild mucosal thickening in several ethmoid air cells. Other visualized paranasal sinuses are clear. Orbits appear symmetric bilaterally. Other: Mastoid air cells are clear. CT CERVICAL SPINE FINDINGS Alignment: There is no appreciable spondylolisthesis. Skull base and vertebrae: Skull base and craniocervical junction regions appear normal. There is anterior wedging of the C7 vertebral body which was not present previously  and appears potentially acute. There is no retropulsion of bone in this area. There is no other evidence suggesting fracture. No blastic or lytic bone lesions. Soft tissues and spinal canal: Prevertebral soft tissues and predental space regions are normal. No cord or canal hematoma. No paraspinous lesions are evident. Disc levels: There is moderate disc space narrowing at C4-5 with milder disc space narrowing at C6-7, stable. There is facet hypertrophy at multiple levels bilaterally. There is impression on exiting nerve roots at C5-6 on the right and at C6-7 bilaterally. No frank disc extrusion or stenosis. Upper chest: There is scarring in each upper lobe, stable Other: There is calcification in each carotid artery. IMPRESSION: CT head: Age related volume loss. Patchy supratentorial small vessel disease, stable. No acute infarct. No mass or hemorrhage. Foci of arterial vascular calcification noted. Mucosal thickening in several ethmoid air cells noted. CT cervical spine: 1. Anterior wedging of the C7 vertebral body, a recent and probably acute. No retropulsion of bone. No other fracture evident. No spondylolisthesis. 2. Multilevel osteoarthritic change with exit foraminal narrowing impressing on exiting nerve roots on the right at C5-6 and C6-7 bilaterally. No frank disc extrusion or stenosis. 3.  Stable upper lobe parenchymal lung scarring. 4.  Carotid artery calcification bilaterally. Critical Value/emergent results were called by telephone at the time of interpretation on 12/18/2019 at 10:12 am to provider Krista Blue, PA, who verbally acknowledged these results. Electronically Signed   By: Lowella Grip III M.D.   On: 12/18/2019 10:14   CT Chest Wo Contrast  Result Date: 12/18/2019 CLINICAL DATA:  Rib fracture suspected, traumatic EXAM: CT CHEST WITHOUT CONTRAST TECHNIQUE: Multidetector CT imaging of the chest was performed following the standard protocol without IV contrast. COMPARISON:  06/07/2019  FINDINGS: Cardiovascular: Aortic atherosclerosis. New enlargement of the tubular ascending thoracic aorta, measuring up to 4.0 x 4.0 cm. Normal heart size. Three-vessel coronary artery calcifications. No pericardial effusion.  Mediastinum/Nodes: No enlarged mediastinal, hilar, or axillary lymph nodes. Thyroid gland, trachea, and esophagus demonstrate no significant findings. Lungs/Pleura: There is a redemonstrated pattern of pulmonary fibrosis featuring unusual, upper lobe predominant peribronchovascular areas of fibrotic consolidation, architectural distortion, bronchiectasis, and bronchiolectasis (series 4, image 34, 52). Mild centrilobular emphysema. Trace right pleural effusion. Upper Abdomen: No acute abnormality. Large cyst or hemangioma of the superior spleen, not significant changed compared to prior examination. Atrophic kidneys, partially imaged. Musculoskeletal: No chest wall mass or suspicious bone lesions identified. There are minimally displaced fractures of the posterior right seventh through eleventh ribs. IMPRESSION: 1. There are minimally displaced fractures of the posterior right seventh through eleventh ribs. Trace right pleural effusion. No pneumothorax. 2. New enlargement of the tubular ascending thoracic aorta, measuring up to 4.0 x 4.0 cm, somewhat concerning for acute aortic injury in the setting of trauma. Consider contrast enhanced CT angiogram to further evaluate. Aortic Atherosclerosis (ICD10-I70.0) 3. There is a redemonstrated pattern of pulmonary fibrosis featuring unusual, upper lobe predominant peribronchovascular areas of fibrotic consolidation, architectural distortion, bronchiectasis, and bronchiolectasis. Findings are in keeping with an "alternative diagnosis" pattern of fibrosis by ATS pulmonary fibrosis criteria, primary differential consideration chronic fibrotic hypersensitivity pneumonitis or other inflammatory inhalational lung disease, or alternately pulmonary sarcoidosis  despite the absence of nodularity or mediastinal lymphadenopathy. 4. Mild emphysema.  Emphysema (ICD10-J43.9). 5. Coronary artery disease. Electronically Signed   By: Eddie Candle M.D.   On: 12/18/2019 10:17   CT Cervical Spine Wo Contrast  Result Date: 12/18/2019 CLINICAL DATA:  Pain following fall EXAM: CT HEAD WITHOUT CONTRAST CT CERVICAL SPINE WITHOUT CONTRAST TECHNIQUE: Multidetector CT imaging of the head and cervical spine was performed following the standard protocol without intravenous contrast. Multiplanar CT image reconstructions of the cervical spine were also generated. COMPARISON:  CT head and CT cervical spine June 07, 2019 FINDINGS: CT HEAD FINDINGS Brain: There is stable age related volume loss. Stable mild invagination of CSF into the sella. There is no intracranial mass, hemorrhage, extra-axial fluid collection, or midline shift. There is patchy small vessel disease in the centra semiovale bilaterally, stable. There is small vessel disease in the anterior limbs of each internal and external capsule. No evident acute infarct. Vascular: No appreciable hyperdense vessels. There is calcification in the distal vertebral arteries and carotid siphon regions bilaterally. Skull: The bony calvarium appears intact. Sinuses/Orbits: There is mild mucosal thickening in several ethmoid air cells. Other visualized paranasal sinuses are clear. Orbits appear symmetric bilaterally. Other: Mastoid air cells are clear. CT CERVICAL SPINE FINDINGS Alignment: There is no appreciable spondylolisthesis. Skull base and vertebrae: Skull base and craniocervical junction regions appear normal. There is anterior wedging of the C7 vertebral body which was not present previously and appears potentially acute. There is no retropulsion of bone in this area. There is no other evidence suggesting fracture. No blastic or lytic bone lesions. Soft tissues and spinal canal: Prevertebral soft tissues and predental space regions are  normal. No cord or canal hematoma. No paraspinous lesions are evident. Disc levels: There is moderate disc space narrowing at C4-5 with milder disc space narrowing at C6-7, stable. There is facet hypertrophy at multiple levels bilaterally. There is impression on exiting nerve roots at C5-6 on the right and at C6-7 bilaterally. No frank disc extrusion or stenosis. Upper chest: There is scarring in each upper lobe, stable Other: There is calcification in each carotid artery. IMPRESSION: CT head: Age related volume loss. Patchy supratentorial small vessel disease, stable. No acute  infarct. No mass or hemorrhage. Foci of arterial vascular calcification noted. Mucosal thickening in several ethmoid air cells noted. CT cervical spine: 1. Anterior wedging of the C7 vertebral body, a recent and probably acute. No retropulsion of bone. No other fracture evident. No spondylolisthesis. 2. Multilevel osteoarthritic change with exit foraminal narrowing impressing on exiting nerve roots on the right at C5-6 and C6-7 bilaterally. No frank disc extrusion or stenosis. 3.  Stable upper lobe parenchymal lung scarring. 4.  Carotid artery calcification bilaterally. Critical Value/emergent results were called by telephone at the time of interpretation on 12/18/2019 at 10:12 am to provider Krista Blue, PA, who verbally acknowledged these results. Electronically Signed   By: Lowella Grip III M.D.   On: 12/18/2019 10:14   MR CERVICAL SPINE WO CONTRAST  Result Date: 12/18/2019 CLINICAL DATA:  Spinal stenosis. Multiple acute right-sided rib fractures after a fall. EXAM: MRI CERVICAL SPINE WITHOUT CONTRAST TECHNIQUE: Multiplanar, multisequence MR imaging of the cervical spine was performed. No intravenous contrast was administered. COMPARISON:  None. FINDINGS: Despite efforts by the technologist and patient, motion artifact is present on today's exam and could not be eliminated. This reduces exam sensitivity and specificity.  Alignment: No vertebral subluxation is observed. Vertebrae: Mild anterior wedging at C7 is present. I do not see significant edema signal in the C7 vertebral body on STIR images, but there is a band of low T1 and T2 signal centrally in the C7 vertebral body (no corresponding visible fracture on the CT). Also, while the degree of anterior wedging is similar between the 06/07/2019 exam and the 01/18/2020 CT examination, the degree of sclerosis along the superior endplate of C7 may be minimally increased, although this is equivocal. Irregularity along the anterior superior endplate of C7 appears to be stable between the 2 CT examinations. There is also mild superior endplate concavity at the T2 vertebral level, not changed from 06/07/2019, an without vertebral edema. Disc desiccation is present throughout the cervical spine. Cord: Taking into account the degree of motion artifact, no abnormal cord signal is identified. Posterior Fossa, vertebral arteries, paraspinal tissues: Partially empty sella. Disc levels: C2-3: Unremarkable C3-4: Mild central narrowing of the thecal sac and borderline bilateral foraminal stenosis due to central disc protrusion, uncinate spurring, and facet arthropathy. C4-5: Prominent left and moderate right foraminal stenosis and moderate central narrowing of the thecal sac due to intervertebral spurring, disc bulge, uncinate spurring, and left facet arthropathy. C5-6: Moderate bilateral foraminal stenosis and moderate central narrowing of the thecal sac due to right eccentric disc bulge, intervertebral spurring, and facet arthropathy. C6-7: Moderate left and mild right foraminal stenosis due to disc bulge, uncinate spurring, and facet arthropathy. C7-T1: Unremarkable. IMPRESSION: 1. There is no edema associated with the anterior wedging of C7. Retrospectively a roughly similar appearance is present on 06/07/2019 compared to today's CT, with mild sclerosis along the superior endplate and mild  anterior wedging with the vertical height of the vertebral body being about 1.0 cm in the area of wedging. Accordingly I am skeptical of acute fracture, and I suspect this appearance is due to chronic subsidence. 2. Cervical spondylosis and degenerative disc disease, causing multilevel impingement as detailed above. 3. Partially empty sella. 4. Despite efforts by the technologist and patient, motion artifact is present on today's exam and could not be eliminated. This reduces exam sensitivity and specificity. Electronically Signed   By: Van Clines M.D.   On: 12/18/2019 15:23   CT CHEST ABDOMEN PELVIS W CONTRAST  Result Date:  12/18/2019 CLINICAL DATA:  Trauma EXAM: CT CHEST, ABDOMEN, AND PELVIS WITH CONTRAST TECHNIQUE: Multidetector CT imaging of the chest, abdomen and pelvis was performed following the standard protocol during bolus administration of intravenous contrast. CONTRAST:  193mL OMNIPAQUE IOHEXOL 300 MG/ML  SOLN COMPARISON:  Same day CT chest, 12/18/2019, CT chest abdomen pelvis, 06/07/2019 FINDINGS: CT CHEST FINDINGS Cardiovascular: Aortic atherosclerosis. As noted on prior examination, there is new enlargement of the tubular ascending thoracic aorta, measuring 4.0 x 3.9 cm, however without evidence of dissection or other acute pathology. Normal heart size. Three-vessel coronary artery calcification. No pericardial effusion. Mediastinum/Nodes: No enlarged mediastinal, hilar, or axillary lymph nodes. Thyroid gland, trachea, and esophagus demonstrate no significant findings. Lungs/Pleura: Trace right pleural effusion. Redemonstrated pattern of pulmonary fibrosis, described in detail on prior examination although featuring upper lobe predominant peribronchovascular areas of fibrotic consolidation, architectural distortion, bronchiectasis, bronchiolectasis. Mild centrilobular emphysema. Musculoskeletal: No chest wall mass or suspicious bone lesions identified. Redemonstrated minimally displaced  fractures of the posterior right seventh through eleventh ribs, and additionally minimally fractures of the lateral right sixth through eighth ribs, not previously noted. CT ABDOMEN PELVIS FINDINGS Hepatobiliary: No solid liver abnormality is seen. Distended gallbladder. Mild biliary ductal dilatation to the ampulla, CBD measuring up to 9 mm in caliber. No obstructing calculi identified. This appearance is similar to prior examination dated 06/07/2019. Pancreas: Unremarkable. No pancreatic ductal dilatation or surrounding inflammatory changes. Spleen: Redemonstrated incidental simple cyst of the superior spleen measuring 5.3 cm. Adrenals/Urinary Tract: Adrenal glands are unremarkable. Atrophic kidneys. No hydronephrosis. Bladder is unremarkable. Stomach/Bowel: Stomach is within normal limits. Appendix appears normal. No evidence of bowel wall thickening, distention, or inflammatory changes. Sigmoid diverticulosis. Vascular/Lymphatic: Aortic atherosclerosis. No enlarged abdominal or pelvic lymph nodes. Reproductive: Calcified uterine fibroids. Other: No abdominal wall hernia or abnormality. No abdominopelvic ascites. Musculoskeletal: Plate and screw fixation of the posterior right acetabulum. Status post left hip total arthroplasty. There are new, subacute to chronic fracture deformities of the right pubis (series 6, image 39). There are new sclerotic although nonacute fracture deformities of the right hemi sacrum (series 3, image 80). Focally very severe arthrosis of L5-S1. IMPRESSION: 1. As noted on prior examination, there is new enlargement of the tubular ascending thoracic aorta, measuring 4.0 x 3.9 cm, however without evidence of dissection or other acute pathology on this contrast enhanced examination. Recommend annual imaging followup by CTA or MRA. This recommendation follows 2010 ACCF/AHA/AATS/ACR/ASA/SCA/SCAI/SIR/STS/SVM Guidelines for the Diagnosis and Management of Patients with Thoracic Aortic Disease.  Circulation. 2010; 121: I696-E952. Aortic aneurysm NOS (ICD10-I71.9). 2. Multiple redemonstrated right-sided rib fractures with associated trace pleural effusions. 3. New, subacute to chronic fracture deformities of the right pubis. There are new sclerotic although nonacute fracture deformities of the right hemisacrum. These fractures were not noted on prior examination dated 06/07/2019. 4. No CT findings of acute traumatic injury to the organs of the chest, abdomen, or pelvis. 5. Redemonstrated unusual pattern of pulmonary fibrosis, described in detail on same day prior examination of the chest. Please see prior examination for discussion. 6. Renal atrophy, in keeping with dialysis dependent ESRD. 7. Coronary artery disease. Aortic Atherosclerosis (ICD10-I70.0). Electronically Signed   By: Eddie Candle M.D.   On: 12/18/2019 13:13   IR US Guide Vasc Access Right  Result Date: 12/19/2019 INDICATION: 74 year old female referred for a temporary hemodialysis catheter placement EXAM: IMAGE GUIDED PLACEMENT OF TEMPORARY HEMODIALYSIS CATHETER MEDICATIONS: None ANESTHESIA/SEDATION: None FLUOROSCOPY TIME:  Fluoroscopy Time: 0 minutes 24 seconds (1 mGy). COMPLICATIONS: None PROCEDURE:  Informed written consent was obtained from the patient and the patient's family after a discussion of the risks, benefits, and alternatives to treatment. Questions regarding the procedure were encouraged and answered. The right neck was prepped with chlorhexidine in a sterile fashion, and a sterile drape was applied covering the operative field. Maximum barrier sterile technique with sterile gowns and gloves were used for the procedure. A timeout was performed prior to the initiation of the procedure. A micropuncture kit was utilized to access the right internal jugular vein under direct, real-time ultrasound guidance after the overlying soft tissues were anesthetized with 1% lidocaine with epinephrine. Ultrasound image documentation was  performed. The microwire was kinked to measure appropriate catheter length. A stiff glidewire was advanced to the level of the IVC. A 20 cm hemodialysis catheter was then placed over the wire. Final catheter positioning was confirmed and documented with a spot radiographic image. The catheter aspirates and flushes normally. The catheter was flushed with appropriate volume heparin dwells. Dressings were applied. The patient tolerated the procedure well without immediate post procedural complication. IMPRESSION: Status post image guided placement of a right IJ temporary hemodialysis catheter. Signed, Dulcy Fanny. Dellia Nims, RPVI [facility] Electronically Signed   By: Corrie Mckusick D.O.   On: 12/19/2019 16:53   DG Chest Port 1 View  Result Date: 12/19/2019 CLINICAL DATA:  Rib fractures. EXAM: PORTABLE CHEST 1 VIEW COMPARISON:  Chest/rib radiographs 12/17/2019 and CT 12/18/2019 FINDINGS: The cardiomediastinal silhouette is unchanged. Aortic atherosclerosis is noted. 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 No acute airspace consolidation, edema, sizable pleural effusion, or pneumothorax is identified. Multiple right rib fractures are again noted. IMPRESSION: Unchanged appearance of the chest. Electronically Signed   By: Logan Bores M.D.   On: 12/19/2019 10:56   IR TUNNELED CENTRAL VENOUS CATHETER PLACEMENT  Result Date: 12/19/2019 INDICATION: 74 year old female referred for a temporary hemodialysis catheter placement EXAM: IMAGE GUIDED PLACEMENT OF TEMPORARY HEMODIALYSIS CATHETER MEDICATIONS: None ANESTHESIA/SEDATION: None FLUOROSCOPY TIME:  Fluoroscopy Time: 0 minutes 24 seconds (1 mGy). COMPLICATIONS: None PROCEDURE: Informed written consent was obtained from the patient and the patient's family after a discussion of the risks, benefits, and alternatives to treatment. Questions  regarding the procedure were encouraged and answered. The right neck was prepped with chlorhexidine in a sterile fashion, and a sterile drape was applied covering the operative field. Maximum barrier sterile technique with sterile gowns and gloves were used for the procedure. A timeout was performed prior to the initiation of the procedure. A micropuncture kit was utilized to access the right internal jugular vein under direct, real-time ultrasound guidance after the overlying soft tissues were anesthetized with 1% lidocaine with epinephrine. Ultrasound image documentation was performed. The microwire was kinked to measure appropriate catheter length. A stiff glidewire was advanced to the level of the IVC. A 20 cm hemodialysis catheter was then placed over the wire. Final catheter positioning was confirmed and documented with a spot radiographic image. The catheter aspirates and flushes normally. The catheter was flushed with appropriate volume heparin dwells. Dressings were applied. The patient tolerated the procedure well without immediate post procedural complication. IMPRESSION: Status post image guided placement of a right IJ temporary hemodialysis catheter. Signed, Dulcy Fanny. Dellia Nims, RPVI [facility] Electronically Signed   By: Corrie Mckusick D.O.   On: 12/19/2019 16:53    Labs:  CBC: Recent Labs  06/14/19 0358 06/14/19 0358 06/15/19 0404 06/15/19 0404 06/15/19 1252 07/12/19 0807 12/17/19 1954 12/19/19 0615  WBC 9.1  --  9.8  --   --   --  9.6 9.7  HGB 9.6*   < > 9.5*   < > 13.9 14.3 10.5* 10.2*  HCT 30.6*   < > 30.2*   < > 41.0 42.0 33.6* 32.4*  PLT 217  --  236  --   --   --  225 241   < > = values in this interval not displayed.    COAGS: Recent Labs    06/08/19 0603  INR 1.3*    BMP: Recent Labs    06/12/19 0416 06/12/19 0416 06/13/19 0338 06/13/19 0338 06/14/19 0358 06/14/19 0358 06/15/19 0404  06/15/19 1252 07/12/19 0807 12/17/19 1954 12/19/19 0615 12/19/19 1500  NA 137   < > 133*   < > 134*   < > 132*   < > 135 130* 132* 130*  K 3.4*   < > 3.8   < > 4.0   < > 4.2   < > 4.3 5.0 6.7* 5.5*  CL 97*   < > 92*   < > 96*   < > 93*   < > 94* 88* 90* 87*  CO2 27   < > 25   < > 25   < > 23  --   --  27 25 27   GLUCOSE 119*   < > 114*   < > 118*   < > 131*   < > 86 181* 105* 137*  BUN 24*   < > 47*   < > 26*   < > 44*   < > 23 45* 72* 76*  CALCIUM 8.0*   < > 8.0*   < > 7.9*   < > 8.5*  --   --  9.4 10.1 9.4  CREATININE 4.57*   < > 6.48*   < > 4.20*   < > 5.87*   < > 5.80* 6.25* 9.06* 9.72*  GFRNONAA 9*   < > 6*   < > 10*   < > 7*  --   --  6* 4* 4*  GFRAA 10*  --  7*  --  11*  --  8*  --   --   --   --   --    < > = values in this interval not displayed.    LIVER FUNCTION TESTS: Recent Labs    03/09/19 1037 03/10/19 0358 03/13/19 0212 06/07/19 1116 06/08/19 1551 12/17/19 1954  BILITOT 0.4  --   --  0.9 1.1 0.5  AST 15  --   --  29 55* 17  ALT 19  --   --  <5 <5 16  ALKPHOS 75  --   --  84 77 131*  PROT 7.2  --   --  7.9 8.0 7.2  ALBUMIN 2.1*   < > 1.8* 3.4* 3.5 3.6   < > = values in this interval not displayed.    Assessment and Plan:  74 y.o. female, inpatient. History of ESRD on HD arrived to the ED at [facility] with injuries related to a fall from home. Found to have multiple rib fractures and a C7 fracture. Patient has a left upper arm AV gorex graft placed on 5.11.21 and a previous nonfunctional left basilic vein fistula created on 6.23.20 with second stage on 9.3.21. Team is concerned that the graft  is clotted after pulling clots during last dialysis session. Graft has no bruit and no thrill. Team is requesting a fistulogram of the left upper arm gore tex graft.   IR placed aright sided  temp HD catheter on 10.18.21. No pertinent imaging. BUN 76, Cr 9.72, potassium 5.54. NKDA. Patient is on subcutaneous prophylactic dose of lovenox.    Patient tentatively scheduled for  10.20.21.  Team instructed to: Keep Patient to be NPO after midnight Hold prophylactic anticoagulation 24 hours prior to scheduled procedure IR will call patient when ready.   Risks and benefits discussed with the patient including, but not limited to bleeding, infection, vascular injury, pulmonary embolism, need for tunneled HD catheter placement or even death.  All of the patient's questions were answered, patient is agreeable to proceed. Consent signed and in chart.     Thank you for this interesting consult.  I greatly enjoyed meeting Rupinder Livingston and look forward to participating in their care.  A copy of this report was sent to the requesting provider on this date.  Electronically Signed: Jacqualine Mau, NP 12/20/2019, 12:44 PM   I spent a total of 40 Minutes    in face to face in clinical consultation, greater than 50% of which was counseling/coordinating care for AVG declot

## 2019-12-20 NOTE — Progress Notes (Signed)
Subjective: Patient reports that she continues to feel poorly. Patient reports that her chest pain is comparable to yesterday. Patient reports that she also feels that she is losing her sense of time. She also is upset that she did not get food yesterday.   Objective:  Vital signs in last 24 hours: Vitals:   12/20/19 1000 12/20/19 1100 12/20/19 1300 12/20/19 1400  BP: 118/71 128/73 (!) 145/77 (!) 142/72  Pulse: 77 75 74 79  Resp: 14 14 17 18   Temp:      TempSrc:      SpO2: 100% 100% 100% 100%  Weight:      Height:       Physical Exam HENT:     Head: Normocephalic and atraumatic.  Cardiovascular:     Rate and Rhythm: Normal rate and regular rhythm.  Pulmonary:     Effort: Pulmonary effort is normal.     Comments: Pain with deep breaths. Pain is intermittent, R sided chest pain tenderness Chest:     Chest wall: Tenderness present.  Abdominal:     General: Abdomen is flat.     Palpations: Abdomen is soft.  Skin:    General: Skin is warm and dry.  Neurological:     Mental Status: She is alert. She is disoriented.  Psychiatric:     Comments: Depressed mood, distracted thoughts     Assessment/Plan:  Principal Problem:   Fall Active Problems:   End-stage renal disease on hemodialysis ([place])   Diabetes mellitus ([place])   Multiple rib fractures   Hyperkalemia   Thoracic ascending aortic aneurysm ([place])  Fractured ribs and Cervical vertebra Patient had a mechanical fall. She is in significant pain and was found to have displaced fractures of the posterior right 7th-11th ribs and a trace pleural effusion.  Trauma surgery does not believe the patient needs surgical intervention at this time and has signed off. They do recommend that the patient have oxy 5-10mg  for her pain and Dilaudid for breakthrough pain. Patient has made it clear that she does not wish to take ibuprofen. PT has seen the patient and recommend SNF.  - Pain mgmt: Tylenol 650 q6 PRN,      OxyIR 5mg -10mg  q4h  PRN Dilaudid 0.5mg  severe pain  Lidocaine patch Robaxin 500mg  q 6 PRN - Incentive spirometry - Pulm toilet - PT/OT - Consult TOC for SNF placement  Fall Etiology for fall appears to be multifactorial at this time. Patient has a history of dizziness after HD. Patient also appears to require assistance with mobility at baseline. Patient reported that she normally waits for her aid to help her.   - F/u BMP - PT/OT  HTN Patient has a history of HTN. Home medications: Norvasc 10mg , Labetalol 200mg  BID - Restarted norvasc - Continue to hold Labetalol will monitor patient BP and HR.  Mood disorder Patient home medication includes Lexapro 10mg . - Restarted Lexapro 10mg   HLD Patient home med Lipitor 40mg  T, TH, S, Sun. - Continue regimen when patient is clear for diet  ESRD (MWF) Patient is ESRD with HD on MWF. Patient was able to get HD yesterday after having temp cath placed. Nephro would like IR to place fistulogram in the left upper arm gortex graft.  - Nephro has been consulted appreciate recc's - IR has been reconsulted for fistulogram - trend CBC - NPO @[place]   Prior to Admission Living Arrangement:Home Anticipated Discharge Location: SNF Barriers to Discharge: [place] Dispo: Anticipated discharge in approximately 1-2 day(s).  Freida Busman, MD 12/20/2019, 3:33 PM Pager: [phone] After 5pm on weekdays and 1pm on weekends: On Call pager [phone]

## 2019-12-20 NOTE — Progress Notes (Signed)
1635 Received pt from ED, A&O x4. Guarding right rib cage area. Pain with movements. Tried to use incentive spirometer,  encouraged to do it more often.

## 2019-12-20 NOTE — Progress Notes (Signed)
[facility] Progress Note  Subjective: pt had HD yest pm, pt doesn't remember it. It was from about 4:30 to 8-9 pm.  Pt had temp cath placed per IR yest, appreciate assistance.   Vitals:   12/20/19 0800 12/20/19 0809 12/20/19 1000 12/20/19 1100  BP: (!) 143/81  118/71 128/73  Pulse: 77  77 75  Resp: 15  14 14   Temp:  98.6 F (37 C)    TempSrc:  Oral    SpO2: 100%  100% 100%  Weight:      Height:        Exam:    alert, nad   no jvd  Chest cta bilat  Cor reg no RG  Abd soft ntnd no ascites   Ext no LE edema   Alert, NF, ox3   LUE AVG w/o thrill or bruit    RIJ temp HD cath in place    CXR 10/18 - no edema   OP HD: MWF [place]  3h 80min   400/800    70.5kg    2 /2.25 bath  LUE AVG  Hep none - Mircera 180mcg IV q 2 weeks - Calcitriol 0.62mcg PO q HD  Assessment/Plan: 1.  C7 + R 7-11th rib fractures: Pain control per primary. Cervical Fx may be chronic in nature. No surgical plans for now. 2. Fall/ possible syncopal episode: Was after dialysis, possibly mild volume depletion. Per primary. 3. Enlarged thoracic aorta: Will f/u with [facility] as outpatient. 4. Hyperkalemia - no lab this am, have ordered , should be better after HD last night 5. HD access: pt has clotted LUA AV graft, consulting IR for this. IR placed temp HD cath 10/18, yesterday, appreciate assistance.  6.  ESRD: HD MWF.  Had HD yest evening. Next HD tomorrow.  7.  Hypertension/volume: BP controlled - minimal edema. UF as tolerated. Wt's are questionable.  8.  Anemia: Hgb 10.2 - not due for ESA yet. 9.  Metabolic bone disease: Ca > 10, hold VRDA for now 10. T2DM: Per primary.    Rob Yazmina Pareja 12/20/2019, 1:14 PM   Recent Labs  Lab 12/17/19 1954 12/17/19 1954 12/19/19 0615 12/19/19 1500  K 5.0   < > 6.7* 5.5*  BUN 45*   < > 72* 76*  CREATININE 6.25*   < > 9.06* 9.72*  CALCIUM 9.4   < > 10.1 9.4  HGB 10.5*  --  10.2*  --    < > = values in this interval not displayed.   Inpatient  medications: . acetaminophen  650 mg Rectal Q8H   Or  . acetaminophen  650 mg Oral Q8H  . Chlorhexidine Gluconate Cloth  6 each Topical Q0600  . escitalopram  10 mg Oral Daily  . lidocaine  1 patch Transdermal Q24H  . ramelteon  8 mg Oral QHS   . sodium chloride    . sodium chloride     sodium chloride, sodium chloride, alteplase, heparin, HYDROmorphone, lidocaine (PF), lidocaine-prilocaine, methocarbamol, oxyCODONE, pentafluoroprop-tetrafluoroeth, senna-docusate

## 2019-12-20 NOTE — ED Notes (Signed)
Attempted to drawl labs, unable to obtain x 2 attempts

## 2019-12-20 NOTE — Progress Notes (Signed)
Subjective: CC: Patient notes pain over right ribs that is not controlled. Reports that only medications that helped were oxycodone and dilaudid. No other areas of pain.   Objective: Vital signs in last 24 hours: Temp:  [98.3 F (36.8 C)-98.6 F (37 C)] 98.6 F (37 C) (10/19 0809) Pulse Rate:  [66-81] 77 (10/19 0800) Resp:  [11-23] 15 (10/19 0800) BP: (70-154)/(41-132) 143/81 (10/19 0800) SpO2:  [85 %-100 %] 100 % (10/19 0800)    Intake/Output from previous day: 10/18 0701 - 10/19 0700 In: 160 [P.O.:100; I.V.:60] Out: 27  Intake/Output this shift: No intake/output data recorded.  PE: Gen:  Alert, NAD, pleasant HEENT: EOM's intact, pupils equal and round Card:  RRR Pulm:  CTAB, no W/R/R, effort normal Abd: Soft, NT/ND, +BS Ext: Moves right shoulder without any pain in shoulder (does have some pain in ribs w/ movement of shoulder). No ttp of the right shoulder, elbow, wrist or hand. No LE edema Psych: A&Ox3  Skin: no rashes noted, warm and dry  Lab Results:  Recent Labs    12/17/19 1954 12/19/19 0615  WBC 9.6 9.7  HGB 10.5* 10.2*  HCT 33.6* 32.4*  PLT 225 241   BMET Recent Labs    12/19/19 0615 12/19/19 1500  NA 132* 130*  K 6.7* 5.5*  CL 90* 87*  CO2 25 27  GLUCOSE 105* 137*  BUN 72* 76*  CREATININE 9.06* 9.72*  CALCIUM 10.1 9.4   PT/INR No results for input(s): LABPROT, INR in the last 72 hours. CMP     Component Value Date/Time   NA 130 (L) 12/19/2019 1500   K 5.5 (H) 12/19/2019 1500   CL 87 (L) 12/19/2019 1500   CO2 27 12/19/2019 1500   GLUCOSE 137 (H) 12/19/2019 1500   BUN 76 (H) 12/19/2019 1500   CREATININE 9.72 (H) 12/19/2019 1500   CALCIUM 9.4 12/19/2019 1500   PROT 7.2 12/17/2019 1954   ALBUMIN 3.6 12/17/2019 1954   AST 17 12/17/2019 1954   ALT 16 12/17/2019 1954   ALKPHOS 131 (H) 12/17/2019 1954   BILITOT 0.5 12/17/2019 1954   GFRNONAA 4 (L) 12/19/2019 1500   GFRAA 8 (L) 06/15/2019 0404   Lipase     Component Value  Date/Time   LIPASE 29 06/07/2019 1116       Studies/Results: DG Ankle Complete Left  Result Date: 12/18/2019 CLINICAL DATA:  Left ankle pain. EXAM: LEFT ANKLE COMPLETE - 3+ VIEW COMPARISON:  None. FINDINGS: Bilateral soft tissue swelling. No fractures or dislocation. Vascular calcifications. IMPRESSION: Bilateral soft tissue swelling. No acute abnormalities otherwise seen. Electronically Signed   By: Dorise Bullion III M.D   On: 12/18/2019 12:48   CT Head Wo Contrast  Result Date: 12/18/2019 CLINICAL DATA:  Pain following fall EXAM: CT HEAD WITHOUT CONTRAST CT CERVICAL SPINE WITHOUT CONTRAST TECHNIQUE: Multidetector CT imaging of the head and cervical spine was performed following the standard protocol without intravenous contrast. Multiplanar CT image reconstructions of the cervical spine were also generated. COMPARISON:  CT head and CT cervical spine June 07, 2019 FINDINGS: CT HEAD FINDINGS Brain: There is stable age related volume loss. Stable mild invagination of CSF into the sella. There is no intracranial mass, hemorrhage, extra-axial fluid collection, or midline shift. There is patchy small vessel disease in the centra semiovale bilaterally, stable. There is small vessel disease in the anterior limbs of each internal and external capsule. No evident acute infarct. Vascular: No appreciable hyperdense vessels. There is  calcification in the distal vertebral arteries and carotid siphon regions bilaterally. Skull: The bony calvarium appears intact. Sinuses/Orbits: There is mild mucosal thickening in several ethmoid air cells. Other visualized paranasal sinuses are clear. Orbits appear symmetric bilaterally. Other: Mastoid air cells are clear. CT CERVICAL SPINE FINDINGS Alignment: There is no appreciable spondylolisthesis. Skull base and vertebrae: Skull base and craniocervical junction regions appear normal. There is anterior wedging of the C7 vertebral body which was not present previously and  appears potentially acute. There is no retropulsion of bone in this area. There is no other evidence suggesting fracture. No blastic or lytic bone lesions. Soft tissues and spinal canal: Prevertebral soft tissues and predental space regions are normal. No cord or canal hematoma. No paraspinous lesions are evident. Disc levels: There is moderate disc space narrowing at C4-5 with milder disc space narrowing at C6-7, stable. There is facet hypertrophy at multiple levels bilaterally. There is impression on exiting nerve roots at C5-6 on the right and at C6-7 bilaterally. No frank disc extrusion or stenosis. Upper chest: There is scarring in each upper lobe, stable Other: There is calcification in each carotid artery. IMPRESSION: CT head: Age related volume loss. Patchy supratentorial small vessel disease, stable. No acute infarct. No mass or hemorrhage. Foci of arterial vascular calcification noted. Mucosal thickening in several ethmoid air cells noted. CT cervical spine: 1. Anterior wedging of the C7 vertebral body, a recent and probably acute. No retropulsion of bone. No other fracture evident. No spondylolisthesis. 2. Multilevel osteoarthritic change with exit foraminal narrowing impressing on exiting nerve roots on the right at C5-6 and C6-7 bilaterally. No frank disc extrusion or stenosis. 3.  Stable upper lobe parenchymal lung scarring. 4.  Carotid artery calcification bilaterally. Critical Value/emergent results were called by telephone at the time of interpretation on 12/18/2019 at 10:12 am to provider Krista Blue, PA, who verbally acknowledged these results. Electronically Signed   By: Lowella Grip III M.D.   On: 12/18/2019 10:14   CT Chest Wo Contrast  Result Date: 12/18/2019 CLINICAL DATA:  Rib fracture suspected, traumatic EXAM: CT CHEST WITHOUT CONTRAST TECHNIQUE: Multidetector CT imaging of the chest was performed following the standard protocol without IV contrast. COMPARISON:  06/07/2019  FINDINGS: Cardiovascular: Aortic atherosclerosis. New enlargement of the tubular ascending thoracic aorta, measuring up to 4.0 x 4.0 cm. Normal heart size. Three-vessel coronary artery calcifications. No pericardial effusion. Mediastinum/Nodes: No enlarged mediastinal, hilar, or axillary lymph nodes. Thyroid gland, trachea, and esophagus demonstrate no significant findings. Lungs/Pleura: There is a redemonstrated pattern of pulmonary fibrosis featuring unusual, upper lobe predominant peribronchovascular areas of fibrotic consolidation, architectural distortion, bronchiectasis, and bronchiolectasis (series 4, image 34, 52). Mild centrilobular emphysema. Trace right pleural effusion. Upper Abdomen: No acute abnormality. Large cyst or hemangioma of the superior spleen, not significant changed compared to prior examination. Atrophic kidneys, partially imaged. Musculoskeletal: No chest wall mass or suspicious bone lesions identified. There are minimally displaced fractures of the posterior right seventh through eleventh ribs. IMPRESSION: 1. There are minimally displaced fractures of the posterior right seventh through eleventh ribs. Trace right pleural effusion. No pneumothorax. 2. New enlargement of the tubular ascending thoracic aorta, measuring up to 4.0 x 4.0 cm, somewhat concerning for acute aortic injury in the setting of trauma. Consider contrast enhanced CT angiogram to further evaluate. Aortic Atherosclerosis (ICD10-I70.0) 3. There is a redemonstrated pattern of pulmonary fibrosis featuring unusual, upper lobe predominant peribronchovascular areas of fibrotic consolidation, architectural distortion, bronchiectasis, and bronchiolectasis. Findings are in keeping with  an "alternative diagnosis" pattern of fibrosis by ATS pulmonary fibrosis criteria, primary differential consideration chronic fibrotic hypersensitivity pneumonitis or other inflammatory inhalational lung disease, or alternately pulmonary sarcoidosis  despite the absence of nodularity or mediastinal lymphadenopathy. 4. Mild emphysema.  Emphysema (ICD10-J43.9). 5. Coronary artery disease. Electronically Signed   By: Eddie Candle M.D.   On: 12/18/2019 10:17   CT Cervical Spine Wo Contrast  Result Date: 12/18/2019 CLINICAL DATA:  Pain following fall EXAM: CT HEAD WITHOUT CONTRAST CT CERVICAL SPINE WITHOUT CONTRAST TECHNIQUE: Multidetector CT imaging of the head and cervical spine was performed following the standard protocol without intravenous contrast. Multiplanar CT image reconstructions of the cervical spine were also generated. COMPARISON:  CT head and CT cervical spine June 07, 2019 FINDINGS: CT HEAD FINDINGS Brain: There is stable age related volume loss. Stable mild invagination of CSF into the sella. There is no intracranial mass, hemorrhage, extra-axial fluid collection, or midline shift. There is patchy small vessel disease in the centra semiovale bilaterally, stable. There is small vessel disease in the anterior limbs of each internal and external capsule. No evident acute infarct. Vascular: No appreciable hyperdense vessels. There is calcification in the distal vertebral arteries and carotid siphon regions bilaterally. Skull: The bony calvarium appears intact. Sinuses/Orbits: There is mild mucosal thickening in several ethmoid air cells. Other visualized paranasal sinuses are clear. Orbits appear symmetric bilaterally. Other: Mastoid air cells are clear. CT CERVICAL SPINE FINDINGS Alignment: There is no appreciable spondylolisthesis. Skull base and vertebrae: Skull base and craniocervical junction regions appear normal. There is anterior wedging of the C7 vertebral body which was not present previously and appears potentially acute. There is no retropulsion of bone in this area. There is no other evidence suggesting fracture. No blastic or lytic bone lesions. Soft tissues and spinal canal: Prevertebral soft tissues and predental space regions are  normal. No cord or canal hematoma. No paraspinous lesions are evident. Disc levels: There is moderate disc space narrowing at C4-5 with milder disc space narrowing at C6-7, stable. There is facet hypertrophy at multiple levels bilaterally. There is impression on exiting nerve roots at C5-6 on the right and at C6-7 bilaterally. No frank disc extrusion or stenosis. Upper chest: There is scarring in each upper lobe, stable Other: There is calcification in each carotid artery. IMPRESSION: CT head: Age related volume loss. Patchy supratentorial small vessel disease, stable. No acute infarct. No mass or hemorrhage. Foci of arterial vascular calcification noted. Mucosal thickening in several ethmoid air cells noted. CT cervical spine: 1. Anterior wedging of the C7 vertebral body, a recent and probably acute. No retropulsion of bone. No other fracture evident. No spondylolisthesis. 2. Multilevel osteoarthritic change with exit foraminal narrowing impressing on exiting nerve roots on the right at C5-6 and C6-7 bilaterally. No frank disc extrusion or stenosis. 3.  Stable upper lobe parenchymal lung scarring. 4.  Carotid artery calcification bilaterally. Critical Value/emergent results were called by telephone at the time of interpretation on 12/18/2019 at 10:12 am to provider Krista Blue, PA, who verbally acknowledged these results. Electronically Signed   By: Lowella Grip III M.D.   On: 12/18/2019 10:14   MR CERVICAL SPINE WO CONTRAST  Result Date: 12/18/2019 CLINICAL DATA:  Spinal stenosis. Multiple acute right-sided rib fractures after a fall. EXAM: MRI CERVICAL SPINE WITHOUT CONTRAST TECHNIQUE: Multiplanar, multisequence MR imaging of the cervical spine was performed. No intravenous contrast was administered. COMPARISON:  None. FINDINGS: Despite efforts by the technologist and patient, motion artifact is present  on today's exam and could not be eliminated. This reduces exam sensitivity and specificity.  Alignment: No vertebral subluxation is observed. Vertebrae: Mild anterior wedging at C7 is present. I do not see significant edema signal in the C7 vertebral body on STIR images, but there is a band of low T1 and T2 signal centrally in the C7 vertebral body (no corresponding visible fracture on the CT). Also, while the degree of anterior wedging is similar between the 06/07/2019 exam and the 01/18/2020 CT examination, the degree of sclerosis along the superior endplate of C7 may be minimally increased, although this is equivocal. Irregularity along the anterior superior endplate of C7 appears to be stable between the 2 CT examinations. There is also mild superior endplate concavity at the T2 vertebral level, not changed from 06/07/2019, an without vertebral edema. Disc desiccation is present throughout the cervical spine. Cord: Taking into account the degree of motion artifact, no abnormal cord signal is identified. Posterior Fossa, vertebral arteries, paraspinal tissues: Partially empty sella. Disc levels: C2-3: Unremarkable C3-4: Mild central narrowing of the thecal sac and borderline bilateral foraminal stenosis due to central disc protrusion, uncinate spurring, and facet arthropathy. C4-5: Prominent left and moderate right foraminal stenosis and moderate central narrowing of the thecal sac due to intervertebral spurring, disc bulge, uncinate spurring, and left facet arthropathy. C5-6: Moderate bilateral foraminal stenosis and moderate central narrowing of the thecal sac due to right eccentric disc bulge, intervertebral spurring, and facet arthropathy. C6-7: Moderate left and mild right foraminal stenosis due to disc bulge, uncinate spurring, and facet arthropathy. C7-T1: Unremarkable. IMPRESSION: 1. There is no edema associated with the anterior wedging of C7. Retrospectively a roughly similar appearance is present on 06/07/2019 compared to today's CT, with mild sclerosis along the superior endplate and mild  anterior wedging with the vertical height of the vertebral body being about 1.0 cm in the area of wedging. Accordingly I am skeptical of acute fracture, and I suspect this appearance is due to chronic subsidence. 2. Cervical spondylosis and degenerative disc disease, causing multilevel impingement as detailed above. 3. Partially empty sella. 4. Despite efforts by the technologist and patient, motion artifact is present on today's exam and could not be eliminated. This reduces exam sensitivity and specificity. Electronically Signed   By: Van Clines M.D.   On: 12/18/2019 15:23   CT CHEST ABDOMEN PELVIS W CONTRAST  Result Date: 12/18/2019 CLINICAL DATA:  Trauma EXAM: CT CHEST, ABDOMEN, AND PELVIS WITH CONTRAST TECHNIQUE: Multidetector CT imaging of the chest, abdomen and pelvis was performed following the standard protocol during bolus administration of intravenous contrast. CONTRAST:  134m OMNIPAQUE IOHEXOL 300 MG/ML  SOLN COMPARISON:  Same day CT chest, 12/18/2019, CT chest abdomen pelvis, 06/07/2019 FINDINGS: CT CHEST FINDINGS Cardiovascular: Aortic atherosclerosis. As noted on prior examination, there is new enlargement of the tubular ascending thoracic aorta, measuring 4.0 x 3.9 cm, however without evidence of dissection or other acute pathology. Normal heart size. Three-vessel coronary artery calcification. No pericardial effusion. Mediastinum/Nodes: No enlarged mediastinal, hilar, or axillary lymph nodes. Thyroid gland, trachea, and esophagus demonstrate no significant findings. Lungs/Pleura: Trace right pleural effusion. Redemonstrated pattern of pulmonary fibrosis, described in detail on prior examination although featuring upper lobe predominant peribronchovascular areas of fibrotic consolidation, architectural distortion, bronchiectasis, bronchiolectasis. Mild centrilobular emphysema. Musculoskeletal: No chest wall mass or suspicious bone lesions identified. Redemonstrated minimally displaced  fractures of the posterior right seventh through eleventh ribs, and additionally minimally fractures of the lateral right sixth through eighth ribs, not  previously noted. CT ABDOMEN PELVIS FINDINGS Hepatobiliary: No solid liver abnormality is seen. Distended gallbladder. Mild biliary ductal dilatation to the ampulla, CBD measuring up to 9 mm in caliber. No obstructing calculi identified. This appearance is similar to prior examination dated 06/07/2019. Pancreas: Unremarkable. No pancreatic ductal dilatation or surrounding inflammatory changes. Spleen: Redemonstrated incidental simple cyst of the superior spleen measuring 5.3 cm. Adrenals/Urinary Tract: Adrenal glands are unremarkable. Atrophic kidneys. No hydronephrosis. Bladder is unremarkable. Stomach/Bowel: Stomach is within normal limits. Appendix appears normal. No evidence of bowel wall thickening, distention, or inflammatory changes. Sigmoid diverticulosis. Vascular/Lymphatic: Aortic atherosclerosis. No enlarged abdominal or pelvic lymph nodes. Reproductive: Calcified uterine fibroids. Other: No abdominal wall hernia or abnormality. No abdominopelvic ascites. Musculoskeletal: Plate and screw fixation of the posterior right acetabulum. Status post left hip total arthroplasty. There are new, subacute to chronic fracture deformities of the right pubis (series 6, image 39). There are new sclerotic although nonacute fracture deformities of the right hemi sacrum (series 3, image 80). Focally very severe arthrosis of L5-S1. IMPRESSION: 1. As noted on prior examination, there is new enlargement of the tubular ascending thoracic aorta, measuring 4.0 x 3.9 cm, however without evidence of dissection or other acute pathology on this contrast enhanced examination. Recommend annual imaging followup by CTA or MRA. This recommendation follows 2010 ACCF/AHA/AATS/ACR/ASA/SCA/SCAI/SIR/STS/SVM Guidelines for the Diagnosis and Management of Patients with Thoracic Aortic Disease.  Circulation. 2010; 121: O962-X528. Aortic aneurysm NOS (ICD10-I71.9). 2. Multiple redemonstrated right-sided rib fractures with associated trace pleural effusions. 3. New, subacute to chronic fracture deformities of the right pubis. There are new sclerotic although nonacute fracture deformities of the right hemisacrum. These fractures were not noted on prior examination dated 06/07/2019. 4. No CT findings of acute traumatic injury to the organs of the chest, abdomen, or pelvis. 5. Redemonstrated unusual pattern of pulmonary fibrosis, described in detail on same day prior examination of the chest. Please see prior examination for discussion. 6. Renal atrophy, in keeping with dialysis dependent ESRD. 7. Coronary artery disease. Aortic Atherosclerosis (ICD10-I70.0). Electronically Signed   By: Eddie Candle M.D.   On: 12/18/2019 13:13   IR US Guide Vasc Access Right  Result Date: 12/19/2019 INDICATION: 74 year old female referred for a temporary hemodialysis catheter placement EXAM: IMAGE GUIDED PLACEMENT OF TEMPORARY HEMODIALYSIS CATHETER MEDICATIONS: None ANESTHESIA/SEDATION: None FLUOROSCOPY TIME:  Fluoroscopy Time: 0 minutes 24 seconds (1 mGy). COMPLICATIONS: None PROCEDURE: Informed written consent was obtained from the patient and the patient's family after a discussion of the risks, benefits, and alternatives to treatment. Questions regarding the procedure were encouraged and answered. The right neck was prepped with chlorhexidine in a sterile fashion, and a sterile drape was applied covering the operative field. Maximum barrier sterile technique with sterile gowns and gloves were used for the procedure. A timeout was performed prior to the initiation of the procedure. A micropuncture kit was utilized to access the right internal jugular vein under direct, real-time ultrasound guidance after the overlying soft tissues were anesthetized with 1% lidocaine with epinephrine. Ultrasound image documentation was  performed. The microwire was kinked to measure appropriate catheter length. A stiff glidewire was advanced to the level of the IVC. A 20 cm hemodialysis catheter was then placed over the wire. Final catheter positioning was confirmed and documented with a spot radiographic image. The catheter aspirates and flushes normally. The catheter was flushed with appropriate volume heparin dwells. Dressings were applied. The patient tolerated the procedure well without immediate post procedural complication. IMPRESSION: Status post image guided  placement of a right IJ temporary hemodialysis catheter. Signed, Dulcy Fanny. Dellia Nims, RPVI [facility] Radiology Electronically Signed   By: Corrie Mckusick D.O.   On: 12/19/2019 16:53   DG Chest Port 1 View  Result Date: 12/19/2019 CLINICAL DATA:  Rib fractures. EXAM: PORTABLE CHEST 1 VIEW COMPARISON:  Chest/rib radiographs 12/17/2019 and CT 12/18/2019 FINDINGS: The cardiomediastinal silhouette is unchanged. Aortic atherosclerosis is noted. 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 No acute airspace consolidation, edema, sizable pleural effusion, or pneumothorax is identified. Multiple right rib fractures are again noted. IMPRESSION: Unchanged appearance of the chest. Electronically Signed   By: Logan Bores M.D.   On: 12/19/2019 10:56   IR TUNNELED CENTRAL VENOUS CATHETER PLACEMENT  Result Date: 12/19/2019 INDICATION: 74 year old female referred for a temporary hemodialysis catheter placement EXAM: IMAGE GUIDED PLACEMENT OF TEMPORARY HEMODIALYSIS CATHETER MEDICATIONS: None ANESTHESIA/SEDATION: None FLUOROSCOPY TIME:  Fluoroscopy Time: 0 minutes 24 seconds (1 mGy). COMPLICATIONS: None PROCEDURE: Informed written consent was obtained from the patient and the patient's family after a discussion of the risks, benefits, and alternatives to treatment. Questions  regarding the procedure were encouraged and answered. The right neck was prepped with chlorhexidine in a sterile fashion, and a sterile drape was applied covering the operative field. Maximum barrier sterile technique with sterile gowns and gloves were used for the procedure. A timeout was performed prior to the initiation of the procedure. A micropuncture kit was utilized to access the right internal jugular vein under direct, real-time ultrasound guidance after the overlying soft tissues were anesthetized with 1% lidocaine with epinephrine. Ultrasound image documentation was performed. The microwire was kinked to measure appropriate catheter length. A stiff glidewire was advanced to the level of the IVC. A 20 cm hemodialysis catheter was then placed over the wire. Final catheter positioning was confirmed and documented with a spot radiographic image. The catheter aspirates and flushes normally. The catheter was flushed with appropriate volume heparin dwells. Dressings were applied. The patient tolerated the procedure well without immediate post procedural complication. IMPRESSION: Status post image guided placement of a right IJ temporary hemodialysis catheter. Signed, Dulcy Fanny. Dellia Nims, RPVI [facility] Radiology Electronically Signed   By: Corrie Mckusick D.O.   On: 12/19/2019 16:53    Anti-infectives: Anti-infectives (From admission, onward)   None       Assessment/Plan GLF R 7-11th ribs- No PTX on CT. Multimodal pain control. Pulm toilet. CXR unchanged 10/18 C7 vertebral body anterior wedging- Per NSGY. Dr. Reatha Armour. MRI yesterday showed this may not be acute. No collar needed by NSGY. Follow up in office in 3 weeks per notes. PT/OT Enlargement of thoracic aorta - EDP contacted [facility], Dr. Kipp Brood. Recommended f/u outpt. CT chest w/ contrast 10/17 without evidence of dissection. Follow up outpatient.  RUQ Abdominal pain- CT A/P neagtive for  intra-abdominal injury Left ankle pain - plain filmsnegative Acute on chronic anemia- likely underlying anemia 2/2 CKD. Monitor.  Subacute to chronic fracture deformities of the right pubis - No acute fx. Trial of therapies. If any problems, could touch base with ortho. Nonacute fracture deformities of the right hemisacrum - No acute fx. Trial of therapies.  If any problems, could touch base with ortho.  Hx of MMP including but not limited to HTN, DM2, ESRF on HD. Per primary/TRH.  FEN -Renal VTE -SCDs, okay for chemical prophylaxis from our standpoint.  ID -None required from  a trauma standpoint at this time  Foley - None  Plan - We will sign off. Primary team adjusting pain medications, pain management per them. Therapies recommending SNF. Pulm toilet. See recs above.     LOS: 2 days    Jillyn Ledger , [facility] Surgery 12/20/2019, 9:28 AM Please see Amion for pager number during day hours 7:00am-4:30pm

## 2019-12-21 ENCOUNTER — Inpatient Hospital Stay (HOSPITAL_COMMUNITY): Payer: Medicare HMO

## 2019-12-21 HISTORY — PX: IR US GUIDE VASC ACCESS LEFT: IMG2389

## 2019-12-21 HISTORY — PX: IR THROMBECTOMY AV FISTULA W/THROMBOLYSIS/PTA INC/SHUNT/IMG LEFT: IMG6106

## 2019-12-21 LAB — BASIC METABOLIC PANEL
Anion gap: 14 (ref 5–15)
BUN: 46 mg/dL — ABNORMAL HIGH (ref 8–23)
CO2: 25 mmol/L (ref 22–32)
Calcium: 8.7 mg/dL — ABNORMAL LOW (ref 8.9–10.3)
Chloride: 93 mmol/L — ABNORMAL LOW (ref 98–111)
Creatinine, Ser: 7.19 mg/dL — ABNORMAL HIGH (ref 0.44–1.00)
GFR, Estimated: 5 mL/min — ABNORMAL LOW (ref >60)
Glucose, Bld: 131 mg/dL — ABNORMAL HIGH (ref 70–99)
Potassium: 4.5 mmol/L (ref 3.5–5.1)
Sodium: 132 mmol/L — ABNORMAL LOW (ref 135–145)

## 2019-12-21 LAB — CBC WITH DIFFERENTIAL/PLATELET
Abs Immature Granulocytes: 0.04 10*3/uL (ref 0.00–0.07)
Basophils Absolute: 0 10*3/uL (ref 0.0–0.1)
Basophils Relative: 0 %
Eosinophils Absolute: 0.3 10*3/uL (ref 0.0–0.5)
Eosinophils Relative: 3 %
HCT: 27.9 % — ABNORMAL LOW (ref 36.0–46.0)
Hemoglobin: 9 g/dL — ABNORMAL LOW (ref 12.0–15.0)
Immature Granulocytes: 0 %
Lymphocytes Relative: 14 %
Lymphs Abs: 1.4 10*3/uL (ref 0.7–4.0)
MCH: 27.6 pg (ref 26.0–34.0)
MCHC: 32.3 g/dL (ref 30.0–36.0)
MCV: 85.6 fL (ref 80.0–100.0)
Monocytes Absolute: 1 10*3/uL (ref 0.1–1.0)
Monocytes Relative: 10 %
Neutro Abs: 7.1 10*3/uL (ref 1.7–7.7)
Neutrophils Relative %: 73 %
Platelets: 194 10*3/uL (ref 150–400)
RBC: 3.26 MIL/uL — ABNORMAL LOW (ref 3.87–5.11)
RDW: 18.1 % — ABNORMAL HIGH (ref 11.5–15.5)
WBC: 9.8 10*3/uL (ref 4.0–10.5)
nRBC: 0.5 % — ABNORMAL HIGH (ref 0.0–0.2)

## 2019-12-21 IMAGING — XA IR THROMBECTOMY AV FISTULA W/THROMBOLYSIS/PTA INC/SHUNT/IMG*L*
11 of 14 series · 14 of 24 positions shown · IV contrast (IODINE)
Comparison: None.

INDICATION: 73-year-old female with history of end-stage renal disease on
hemodialysis with clinical suspicion for thrombosis of left upper
extremity arteriovenous graft.

[Series 1: fl (-) angio · 1 of 16 slices shown (1 of 9)]
[im 1/16]
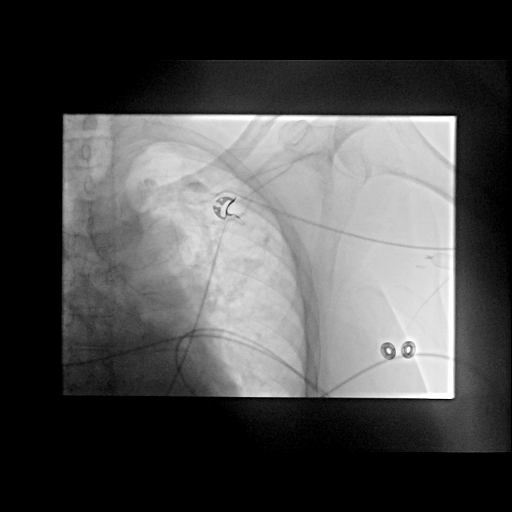

[Series 1: ir thrombectomy av fistula w/thrombolysis/pta inc/ · 1 of 3 slices shown]
[im 1/3]
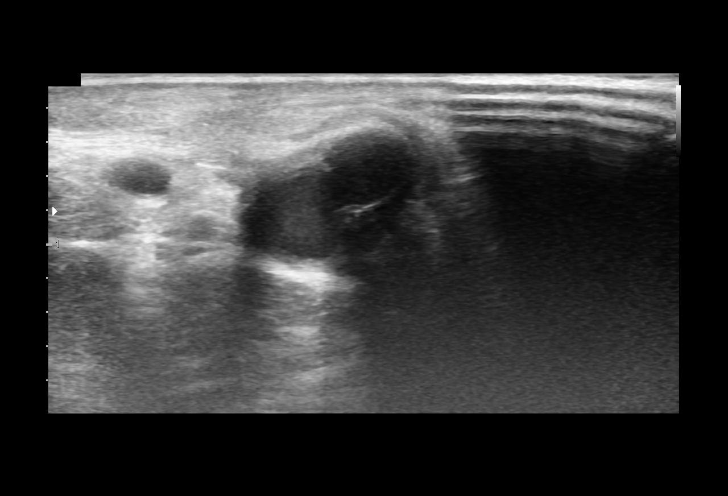

[Series 3: fl (-) angio · 1 of 9 slices shown (2 of 9)]
[im 1/9]
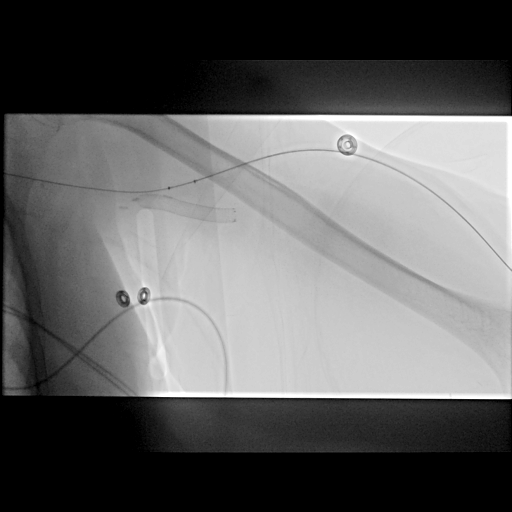

[Series 4: fl (-) angio · 3 of 28 slices shown (3 of 9)]
[im 7/28]
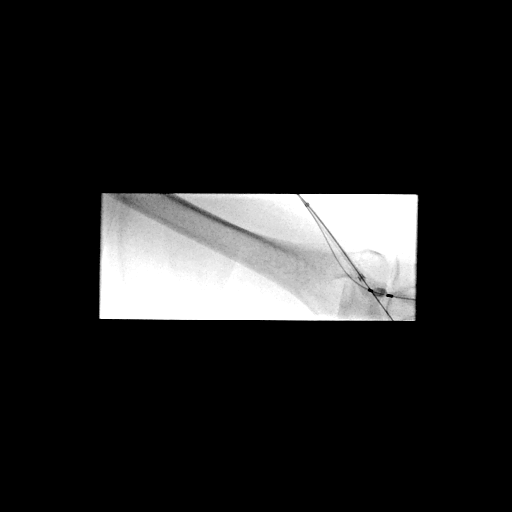
[im 14/28]
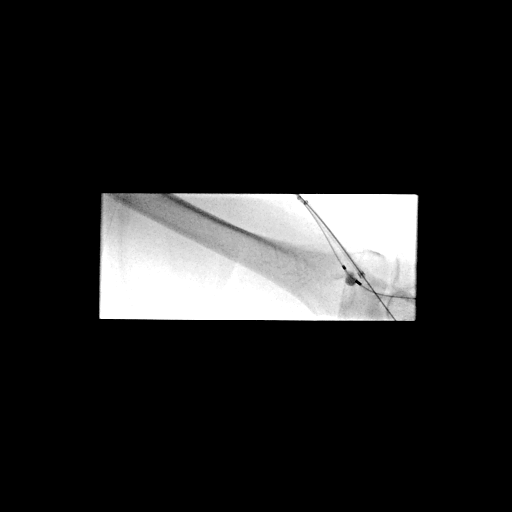
[im 28/28]
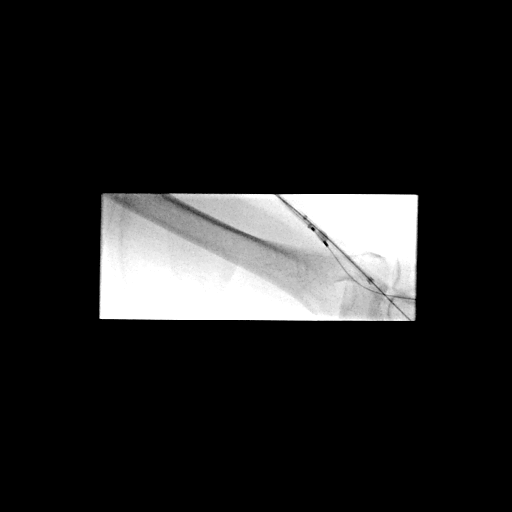

[Series 5: fl (-) angio · 2 of 12 slices shown (4 of 9)]
[im 6/12]
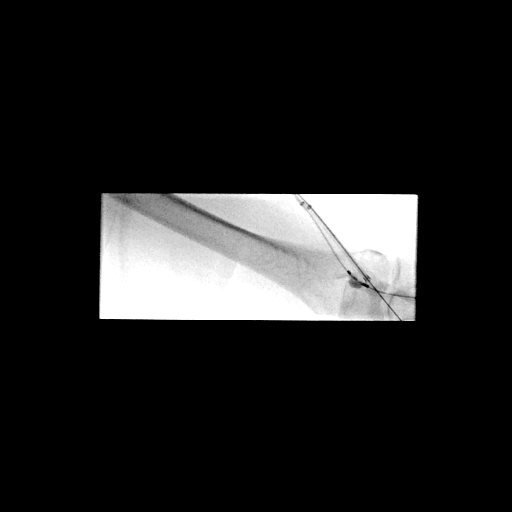
[im 12/12]
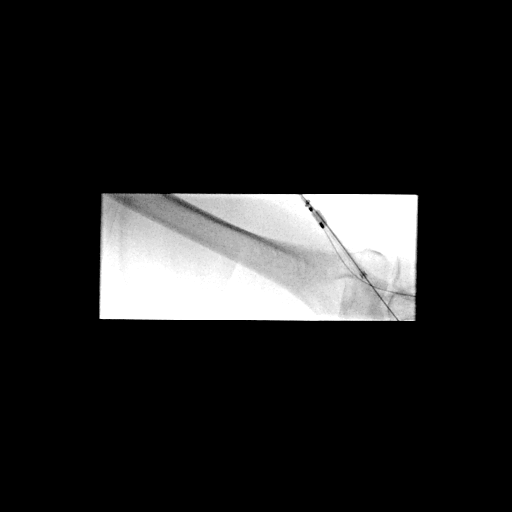

[Series 6: fl (-) angio · 1 of 16 slices shown (5 of 9)]
[im 8/16]
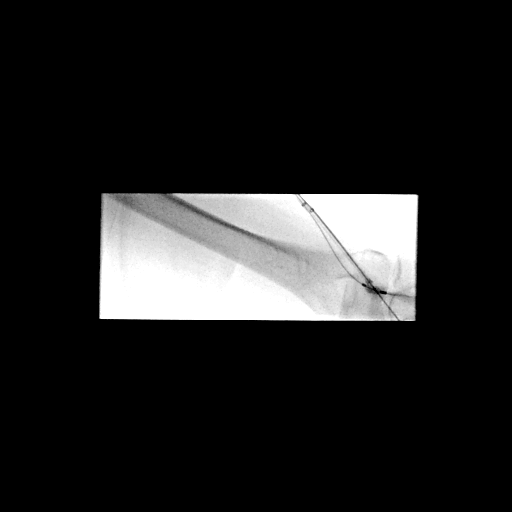

[Series 7: fl (-) angio · 1 of 8 slices shown (6 of 9)]
[im 1/8]
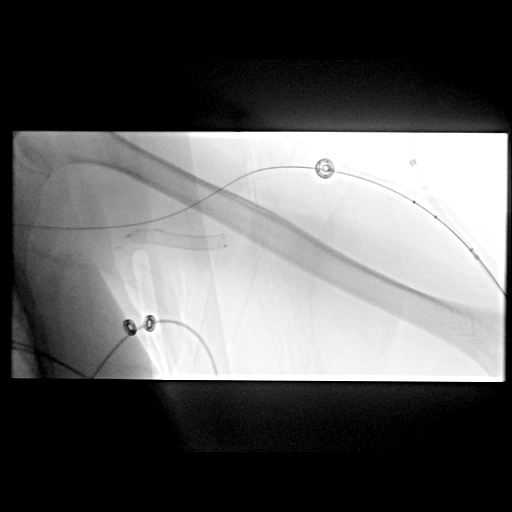

[Series 8: fl (-) angio · 1 of 4 slices shown (7 of 9)]
[im 1/4]
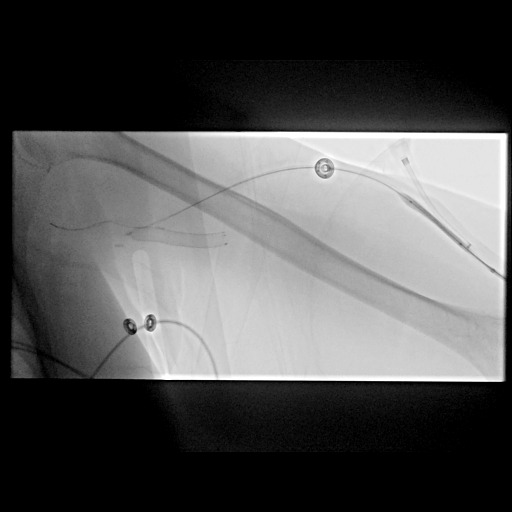

[Series 9: fl (-) angio · 1 of 1 slices shown (8 of 9)]
[im 1/1]
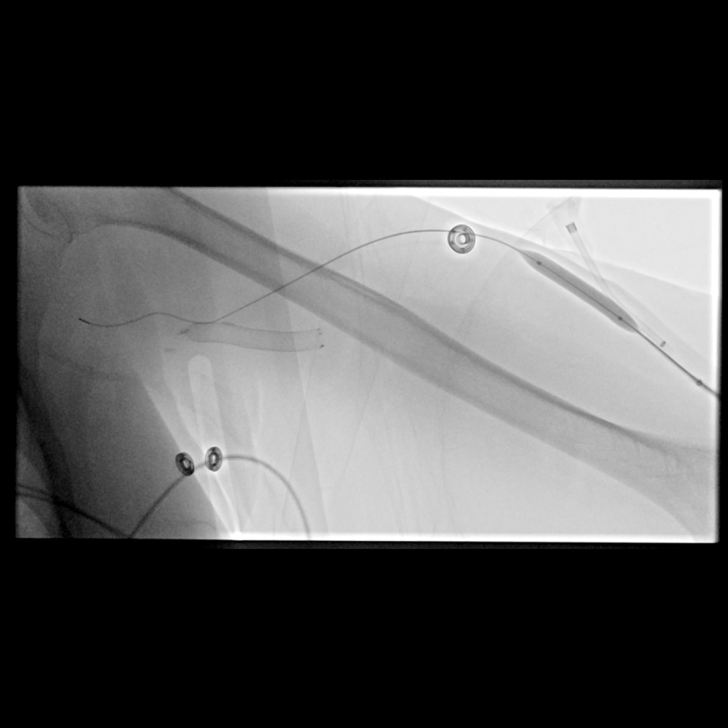

[Series 11: fl (-) angio · 1 of 1 slices shown (9 of 9)]
[im 1/1]
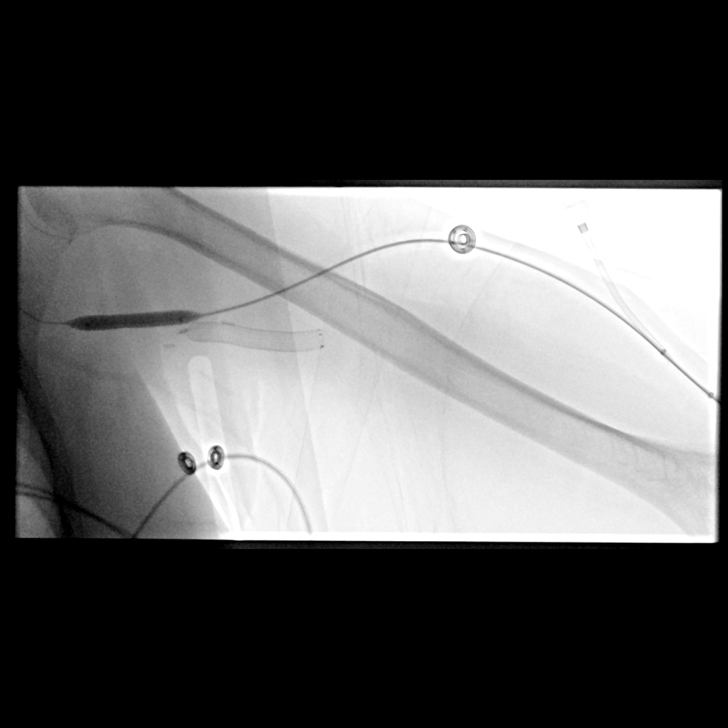

[Series 13: extr.4 care · 1 of 5 slices shown]
[im 1/5]
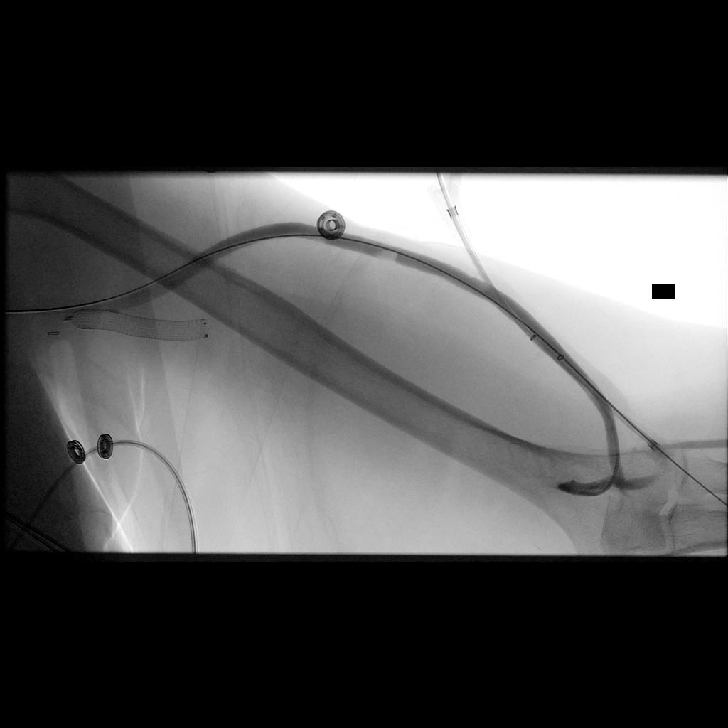

[14 of 24 positions shown; findings below may reference images not displayed]

EXAM:
1. Ultrasound-guided antegrade venous access of left upper extremity
arteriovenous graft
2. Pharmacomechanical thrombolysis of left upper extremity
arteriovenous graft
3. Ultrasound-guided retrograde venous access of left upper
extremity arteriovenous graft
4. Arteriovenous anastomosis balloon thrombectomy
5. Rheolytic thrombectomy of left upper extremity arteriovenous
graft
6. Balloon maceration of left upper extremity arteriovenous graft
7. Completion fistulogram of left upper extremity graft
MEDICATIONS:
[QO] units heparin, intravenous

CONTRAST:  40mL OMNIPAQUE IOHEXOL 300 MG/ML  SOLN

ANESTHESIA/SEDATION:
Moderate (conscious) sedation was employed during this procedure. A
total of Versed 0.5 mg and Fentanyl 37.5 mcg was administered
intravenously.

Moderate Sedation Time: 44 minutes. The patient's level of
consciousness and vital signs were monitored continuously by
radiology nursing throughout the procedure under my direct
supervision.

FLUOROSCOPY TIME:  6 minutes, 24 seconds, 3 mGy

COMPLICATIONS:
None immediate.

PROCEDURE:
Informed written consent was obtained from the patient after a
discussion of the risk, benefits and alternatives to treatment.
Questions regarding the procedure were encouraged and answered. A
timeout was performed prior to the initiation of the procedure.

The left arm dialysis graft was prepped with Chlorhexidine in a
sterile fashion, and a sterile drape was applied covering the
operative field. Preprocedure ultrasound demonstrated complete
thrombosis of the visualized portion indwelling brachial axillary
graft. Using ultrasound guidance, 21 grade should micropuncture
needle was inserted into the graft in antegrade fashion near the
arterial anastomosis. TIGER wire was then directed to the central
veins over which the micropuncture sheath was exchanged for a 6
French sheath. A 5 French Kumpe the catheter was then directed to
the level of the innominate vein. Hand injection of contrast was
performed as the catheter was withdrawn to delineate the central
aspect of the thrombosed graft which was at the level of the
axillary vein.

Next, pharmacomechanical thrombolysis was performed with 8 mg tPA
admixed into 250 mg of heparinized saline. A total of 100 mg of the
solution was administered from the antegrade sheath to the central
portion of the occluded axillary vein. The tPA was allowed to dwell.

Next, retrograde access was obtained with ultrasound guidance and a
21 gauge needle. A Glidewire was directed into the brachial artery,
over which a 6 French sheath was placed. Fogarty balloon
thrombectomy was performed with 3 sweeping passes through the
arterial anastomosis.

Next, rheolytic thrombectomy was performed through the venous
outflow from the antegrade sheath. This was tolerated well without
physiologic reactive changes. This was followed by balloon
maceration with a 7 mm x 4 cm mustang balloon from the peripheral
aspect of the antegrade access to the axillary vein.

Fistulagram was then performed from the antegrade sheath which
demonstrated brisk antegrade flow without flow-limiting stenosis in
the outflow portion of the graft. Retrograde access was then
obtained into the brachial vein just proximal to the anastomosis
with a Glidewire and Kumpe the catheter. Hand injection through the
Kumpe the catheter demonstrated patent graft with brisk outflow.

The sheaths were removed and hemostasis obtained with application of
a 2-0 Prolene pursestring suture which will be removed at the
patient's next dialysis session. A dressing was placed. The patient
tolerated the procedure well without immediate postprocedural
complication.
FINDINGS: Occluded indwelling brachial axillary graft. Successful declot as
described above. Widely patent graft after declot.
IMPRESSION: 1. Occluded indwelling left brachial artery to axillary vein
dialysis graft.
2. Successful pharmacomechanical thrombolysis, arterial balloon
thrombectomy, rheolytic thrombectomy, balloon maceration for
recanalization of the left upper extremity graft.

ACCESS:
This access remains amenable to future percutaneous interventions as
clinically indicated.

## 2019-12-21 MED ORDER — FENTANYL CITRATE (PF) 100 MCG/2ML IJ SOLN
INTRAMUSCULAR | Status: AC | PRN
Start: 2019-12-21 — End: 2019-12-21
  Administered 2019-12-21: 12.5 ug via INTRAVENOUS
  Administered 2019-12-21: 25 ug via INTRAVENOUS

## 2019-12-21 MED ORDER — HEPARIN SODIUM (PORCINE) 1000 UNIT/ML IJ SOLN
INTRAMUSCULAR | Status: AC
  Filled 2019-12-21: qty 1

## 2019-12-21 MED ORDER — HEPARIN SODIUM (PORCINE) 1000 UNIT/ML IJ SOLN
INTRAMUSCULAR | Status: AC | PRN
  Administered 2019-12-21: 4000 [IU] via INTRAVENOUS

## 2019-12-21 MED ORDER — MIDAZOLAM HCL 2 MG/2ML IJ SOLN
INTRAMUSCULAR | Status: AC
  Filled 2019-12-21: qty 2

## 2019-12-21 MED ORDER — CALCITRIOL 0.25 MCG PO CAPS
0.2500 ug | ORAL_CAPSULE | ORAL | Status: DC
  Administered 2019-12-23 – 2019-12-26 (×2): 0.25 ug via ORAL
  Filled 2019-12-21 (×2): qty 1

## 2019-12-21 MED ORDER — MIDAZOLAM HCL 2 MG/2ML IJ SOLN
INTRAMUSCULAR | Status: AC | PRN
  Administered 2019-12-21: 0.5 mg via INTRAVENOUS

## 2019-12-21 MED ORDER — DARBEPOETIN ALFA 60 MCG/0.3ML IJ SOSY
PREFILLED_SYRINGE | INTRAMUSCULAR | Status: AC
  Filled 2019-12-21: qty 0.3

## 2019-12-21 MED ORDER — FENTANYL CITRATE (PF) 100 MCG/2ML IJ SOLN
INTRAMUSCULAR | Status: AC
  Filled 2019-12-21: qty 2

## 2019-12-21 MED ORDER — LIDOCAINE HCL 1 % IJ SOLN
INTRAMUSCULAR | Status: AC | PRN
  Administered 2019-12-21: 30 mL via INTRADERMAL

## 2019-12-21 MED ORDER — DARBEPOETIN ALFA 60 MCG/0.3ML IJ SOSY
60.0000 ug | PREFILLED_SYRINGE | INTRAMUSCULAR | Status: DC
  Administered 2019-12-21: 60 ug via INTRAVENOUS

## 2019-12-21 MED ORDER — IOHEXOL 300 MG/ML  SOLN
100.0000 mL | Freq: Once | INTRAMUSCULAR | Status: AC | PRN
  Administered 2019-12-21: 40 mL

## 2019-12-21 MED ORDER — CALCITRIOL 0.25 MCG PO CAPS
ORAL_CAPSULE | ORAL | Status: AC
  Filled 2019-12-21: qty 1

## 2019-12-21 MED ORDER — LIDOCAINE HCL (PF) 1 % IJ SOLN
INTRAMUSCULAR | Status: AC
  Filled 2019-12-21: qty 30

## 2019-12-21 MED ORDER — ALTEPLASE 2 MG IJ SOLR
8.0000 mg | Freq: Once | INTRAMUSCULAR | Status: DC
  Filled 2019-12-21 (×2): qty 8

## 2019-12-21 NOTE — NC FL2 (Signed)
[place] LEVEL OF CARE SCREENING TOOL     IDENTIFICATION  Patient Name: Bianca Myers Birthdate: 02/10/1946 Sex: female Admission Date (Current Location): 12/17/2019  [facility] and [place] Number:  Herbalist and Address:  The . [facility], [place] [address], [place], [place] [address]      Provider Number: [id number]  Attending Physician Name and Address:  Oda Kilts, MD  Relative Name and Phone Number:       Current Level of Care: Hospital Recommended Level of Care: [place] Prior Approval Number:    Date Approved/Denied:   PASRR Number:    Discharge Plan:      Current Diagnoses: Patient Active Problem List   Diagnosis Date Noted  . Fall 12/19/2019  . Multiple rib fractures 12/19/2019  . Hyperkalemia 12/19/2019  . Thoracic ascending aortic aneurysm ([place]) 12/19/2019  . Left displaced femoral neck fracture ([place])   . Syncope 06/07/2019  . MSSA bacteremia 03/10/2019  . Diabetes mellitus ([place]) 03/10/2019  . 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  . Mild aortic stenosis 03/10/2019  . HCAP (healthcare-associated pneumonia) 03/09/2019  . Heart failure ([place]) 08/31/2018  . Anemia of chronic kidney failure, stage 5 ([place]) 08/27/2018  . Chronic depression 08/27/2018  . Patient is Jehovah's Witness 08/27/2018  . Essential hypertension   . End-stage renal disease on hemodialysis ([place]) 12/10/2015    Orientation RESPIRATION BLADDER Height & Weight     Self, Time, Situation, Place  Normal Continent Weight: 74 kg Height:  5\' 7"  (170.2 cm)  BEHAVIORAL SYMPTOMS/MOOD NEUROLOGICAL BOWEL NUTRITION STATUS      Continent Diet (refer to d/c summary)  AMBULATORY STATUS COMMUNICATION OF NEEDS Skin   Extensive Assist Verbally Normal                       Personal Care Assistance Level of Assistance  Bathing, Feeding, Dressing Bathing Assistance: Maximum assistance Feeding assistance: Independent Dressing  Assistance: Maximum assistance     Functional Limitations Info  Sight, Hearing, Speech Sight Info: Adequate Hearing Info: Adequate Speech Info: Adequate    SPECIAL CARE FACTORS FREQUENCY  PT (By licensed PT), OT (By licensed OT)     PT Frequency: 5x/week, evaluate and treat OT Frequency: 5x/week, evaluate and treat            Contractures Contractures Info: Not present    Additional Factors Info  Code Status, Allergies, Insulin Sliding Scale Code Status Info: full code Allergies Info: no known allergies           Current Medications (12/21/2019):  This is the current hospital active medication list Current Facility-Administered Medications  Medication Dose Route Frequency Provider Last Rate Last Admin  . 0.9 %  sodium chloride infusion  100 mL Intravenous PRN Loren Racer, PA-C      . 0.9 %  sodium chloride infusion  100 mL Intravenous PRN Loren Racer, PA-C      . acetaminophen (TYLENOL) suppository 650 mg  650 mg Rectal Q8H Andrew Au, MD       Or  . acetaminophen (TYLENOL) tablet 650 mg  650 mg Oral Q8H Andrew Au, MD   650 mg at 12/20/19 2143  . alteplase (CATHFLO ACTIVASE) injection 2 mg  2 mg Intracatheter Once PRN Loren Racer, PA-C      . amLODipine (NORVASC) tablet 10 mg  10 mg Oral Daily Damita Dunnings B, MD   10 mg at 12/20/19 1822  .  calcitRIOL (ROCALTROL) 0.25 MCG capsule           . calcitRIOL (ROCALTROL) capsule 0.25 mcg  0.25 mcg Oral Once per day on Mon Wed Fri Stovall, Kathryn R, PA-C      . Chlorhexidine Gluconate Cloth 2 % PADS 6 each  6 each Topical Q0600 Loren Racer, PA-C      . Darbepoetin Alfa (ARANESP) injection 60 mcg  60 mcg Intravenous Q Wed-HD Loren Racer, PA-C   60 mcg at 12/21/19 1003  . escitalopram (LEXAPRO) tablet 10 mg  10 mg Oral Daily Madalyn Rob, MD   10 mg at 12/20/19 1006  . heparin injection 1,000 Units  1,000 Units Dialysis PRN Loren Racer, PA-C      . HYDROmorphone (DILAUDID)  tablet 1 mg  1 mg Oral Q4H PRN Damita Dunnings B, MD   1 mg at 12/20/19 2142  . influenza vaccine adjuvanted (FLUAD) injection 0.5 mL  0.5 mL Intramuscular Tomorrow-1000 Oda Kilts, MD      . lidocaine (LIDODERM) 5 % 1 patch  1 patch Transdermal Q24H Madalyn Rob, MD   1 patch at 12/20/19 [phone]  . lidocaine (PF) (XYLOCAINE) 1 % injection 5 mL  5 mL Intradermal PRN Loren Racer, PA-C      . lidocaine-prilocaine (EMLA) cream 1 application  1 application Topical PRN Loren Racer, PA-C      . methocarbamol (ROBAXIN) tablet 500 mg  500 mg Oral Q6H PRN Madalyn Rob, MD   500 mg at 12/20/19 9924  . oxyCODONE (Oxy IR/ROXICODONE) immediate release tablet 5 mg  5 mg Oral Q4H PRN Damita Dunnings B, MD   5 mg at 12/21/19 0714  . pentafluoroprop-tetrafluoroeth (GEBAUERS) aerosol 1 application  1 application Topical PRN Loren Racer, PA-C      . ramelteon (ROZEREM) tablet 8 mg  8 mg Oral QHS Andrew Au, MD   8 mg at 12/21/19 0045  . senna-docusate (Senokot-S) tablet 1 tablet  1 tablet Oral QHS PRN Madalyn Rob, MD         Discharge Medications: Please see discharge summary for a list of discharge medications.  Relevant Imaging Results:  Relevant Lab Results:   Additional Information HD MWF at [facility]      SSN [id number]  Sharin Mons, RN

## 2019-12-21 NOTE — Progress Notes (Signed)
OT Cancellation Note  Patient Details Name: Bianca Myers MRN: [id number] DOB: [birth year]   Cancelled Treatment:    Reason Eval/Treat Not Completed: Patient at procedure or test/ unavailable. On second OT eval attempt, pt being taken off of unit to IR. Will re-attempt OT eval tomorrow.   Layla Maw 12/21/2019, 2:30 PM

## 2019-12-21 NOTE — Progress Notes (Signed)
0720 Patient left for dialysis via bed with HD nurse and chart. Mask placed on face before leaving the floor. Report given to oncoming nurse, Christy Sartorius.

## 2019-12-21 NOTE — TOC Initial Note (Addendum)
Transition of Care [facility]) - Initial/Assessment Note    Patient Details  Name: Bianca Myers MRN: [id number] Date of Birth: September 26, 1945  [facility]) CM/SW Contact:    Sharin Mons, RN Phone Number: 12/21/2019, 11:59 AM  Clinical Narrative:      Admitted after a fall, suffered  posterior right 7th-11th rib fractures/ C7 compression fracture, thrombosed fistula. Hx of  ESRD on HD /  OP HD: MWF [place], DM, and HTN. Pt from home. PTA independent with ADL's. Lives alone. Supportive son, Brenton Grills.      RNCM received consult for possible SNF placement at time of discharge. RNCM spoke with patient regarding PT recommendation of SNF placement at time of discharge. Patient reported that she understands she  is currently unable to care for self independently at home given her current physical needs and fall risk. Patient expressed understanding of PT recommendation and is unsure if she wants to go to SNF at time of discharge, however , gave NCM the ok to work her up for SNF. Patient woud not give preference . RNCM discussed insurance authorization process and provided Medicare SNF ratings list.  No further questions reported at this time. RNCM to continue to follow and assist with discharge planning needs.  Doristine Bosworth (Son)     [phone]       Expected Discharge Plan: [place] (vs [facility]) Barriers to Discharge: Continued Medical Work up   Patient Goals and CMS Choice Patient states their goals for this hospitalization and ongoing recovery are:: to get better CMS Medicare.gov Compare Post Acute Care list provided to:: Patient    Expected Discharge Plan and Services Expected Discharge Plan: [place] (vs [facility])         Expected Discharge Date: 12/19/19                                    Prior Living Arrangements/Services                       Activities of Daily Living Home Assistive Devices/Equipment: Gilford Rile  (specify type) ADL Screening (condition at time of admission) Patient's cognitive ability adequate to safely complete daily activities?: Yes Is the patient deaf or have difficulty hearing?: No Does the patient have difficulty seeing, even when wearing glasses/contacts?: No Does the patient have difficulty concentrating, remembering, or making decisions?: No Patient able to express need for assistance with ADLs?: Yes Does the patient have difficulty dressing or bathing?: Yes Independently performs ADLs?: No Does the patient have difficulty walking or climbing stairs?: Yes Weakness of Legs: Both Weakness of Arms/Hands: Right  Permission Sought/Granted       Emotional Assessment      Admission diagnosis:  Fracture [T14.8XXA] Rib fractures [S22.49XA] Trauma [T14.90XA] ESRD (end stage renal disease) ([place]) [N18.6] Closed fracture of multiple ribs of right side, initial encounter [S22.41XA] Other closed nondisplaced fracture of seventh cervical vertebra, initial encounter [facility]) [A25.053Z] Patient Active Problem List   Diagnosis Date Noted  . Fall 12/19/2019  . Multiple rib fractures 12/19/2019  . Hyperkalemia 12/19/2019  . Thoracic ascending aortic aneurysm ([place]) 12/19/2019  . Left displaced femoral neck fracture ([place])   . Syncope 06/07/2019  . MSSA bacteremia 03/10/2019  . Diabetes mellitus ([place]) 03/10/2019  . 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  . Mild aortic stenosis 03/10/2019  . HCAP (healthcare-associated pneumonia) 03/09/2019  . Heart failure ([place]) 08/31/2018  .  Anemia of chronic kidney failure, stage 5 ([place]) 08/27/2018  . Chronic depression 08/27/2018  . Patient is Jehovah's Witness 08/27/2018  . Essential hypertension   . End-stage renal disease on hemodialysis ([place]) 12/10/2015   PCP:  [name], MD Pharmacy:   [facility] [place], [place] AT [address] [place]  [address] Phone: [phone] Fax: [phone]     Social Determinants of Health (SDOH) Interventions    Readmission Risk Interventions No flowsheet data found.

## 2019-12-21 NOTE — Procedures (Signed)
Interventional Radiology Procedure Note  Procedure: Left AVG declot  Findings: Please refer to procedural dictation for full description. Successful declot of left brachioaxillary graft with pharmacomechanical thrombolysis, rheolytic thrombectomy, balloon maceration, and arterial balloon thrombectomy.  Complications: None immediate  Estimated Blood Loss: <10 mL  Recommendations: OK to use left upper extremity AVG immediately.  Please remove prolene sutures at access sites at next HD session.   Ruthann Cancer, MD Pager: [phone]

## 2019-12-21 NOTE — Progress Notes (Signed)
Subjective: Patient continues to report significant pain in her chest. 9/10 today. She reports it is slightly better than yesterday. Patient also feels better having a room with a window.    Objective:  Vital signs in last 24 hours: Vitals:   12/20/19 1642 12/20/19 2007 12/21/19 0034 12/21/19 0537  BP: (!) 159/79 125/81 (!) 145/82 137/73  Pulse: 75 72 75 68  Resp: 17 18 17 18   Temp: 99 F (37.2 C) 98.7 F (37.1 C) 97.9 F (36.6 C) 98.2 F (36.8 C)  TempSrc: Oral Oral Oral Oral  SpO2: 92% 96% 92% 98%  Weight:      Height:       Physical Exam Constitutional:      Appearance: Normal appearance.  Cardiovascular:     Rate and Rhythm: Normal rate and regular rhythm.  Pulmonary:     Breath sounds: Normal breath sounds.     Comments: Significant pain with deep breaths. But good breath sounds in the apical lobes. Abdominal:     General: Bowel sounds are normal.     Palpations: Abdomen is soft.  Neurological:     General: No focal deficit present.     Mental Status: She is alert.  Psychiatric:        Mood and Affect: Mood normal.        Behavior: Behavior normal.     Assessment/Plan:  Principal Problem:   Fall Active Problems:   End-stage renal disease on hemodialysis ([place])   Diabetes mellitus ([place])   Multiple rib fractures   Hyperkalemia   Thoracic ascending aortic aneurysm ([place]) Fractured ribs and Cervical vertebra Patient had a mechanical fall. She is in significant pain and was found to have displaced fractures of the posterior right 7th-11th ribs and a trace pleural effusion.Patient required 1mg  of Dilaudid and 10mg  OxyIR in the past 24h. and  - Pain mgmt: Tylenol 650 q6 PRN,      OxyIR 5mg -10mg  q4h PRN Dilaudid 0.5mg  severe pain  Lidocaine patch Robaxin 500mg  q 6 PRN - Incentive spirometry - Pulm toilet -PT/OT - Consult TOC for SNF placement  Fall Etiology for fall appears to be multifactorial at this time. Patient has a history of  dizziness after HD. Patient also appears to require assistance with mobility at baseline. Patient reported that she normally waits for her aid to help her. PT recommends SNF . Na this AM is 133. Patient has had decreased PO intake during this admission due to procedures and her original C-collar status. Will continue to monitor as patient as continued to report appetite, but her schedule has made it difficult to eat. K this AM 4.4  - F/u BMP - PT/OT  HTN BP range 118/71-159/79. Patient has a history of HTN. Home medications: Norvasc 10mg , Labetalol 200mg  BID - Restarted norvasc - Continue to hold Labetalol will monitor patient BP and HR.  Mood disorder Patient home medication includes Lexapro 10mg . -Continue home Lexapro 10mg   HLD Patient home med Lipitor 40mg  T, TH, S, Sun. - Continue regimen when patient is clear for diet  ESRD (MWF) Patient is ESRD with HD on MWF. Patient is to get left arm AVGG declot vs HD catheter placement today. Patient will receive HD today. -Nephro has been consulted appreciate recc's - trend CBC   Prior to Admission Living Arrangement:Home Anticipated Discharge Location: SNF Barriers to Discharge: [place] Dispo: Anticipated discharge in approximately 1-2 day(s).    Freida Busman, MD 12/21/2019, 6:32 AM Pager: [phone] After 5pm on weekdays  and 1pm on weekends: On Call pager [phone]

## 2019-12-21 NOTE — Plan of Care (Signed)

## 2019-12-21 NOTE — Progress Notes (Signed)
[facility] Progress Note   Subjective:  Seen on HD - 2L UFG, but reducing as she is slightly hypotensive. Still with R sided CP (rib Fx). Temp IJ cath in use for HD currently. Per notes, she will be going for L AVG declot this morning.  Objective Vitals:   12/21/19 0034 12/21/19 0537 12/21/19 0736 12/21/19 0740  BP: (!) 145/82 137/73 (!) 159/78 (!) 157/73  Pulse: 75 68    Resp: _0 Temp: 97.9 F (36.6 C) 98.2 F (36.8 C) 98.2 F (36.8 C)   TempSrc: Oral Oral Oral   SpO2: 92% 98% 98%   Weight:   75.1 kg   Height:       Physical Exam General: Well appearing woman, NAD Heart: RRR; no murmur Lungs: CTA anteriorly Abdomen: soft, non-tender Extremities: No LE edema Dialysis Access:  clotted LUE AVG, temp HD cath in R IJ  Additional Objective Labs: Basic Metabolic Panel: Recent Labs  Lab 12/19/19 1500 12/20/19 1700 12/21/19 0233  NA 130* 133* 132*  K 5.5* 4.4 4.5  CL 87* 91* 93*  CO2 _1 GLUCOSE 137* 122* 131*  BUN 76* 36* 46*  CREATININE 9.72* 6.37* 7.19*  CALCIUM 9.4 8.7* 8.7*   Liver Function Tests: Recent Labs  Lab 12/17/19 1954  AST 17  ALT 16  ALKPHOS 131*  BILITOT 0.5  PROT 7.2  ALBUMIN 3.6   CBC: Recent Labs  Lab 12/17/19 1954 12/17/19 1954 12/19/19 0615 12/20/19 1700 12/21/19 0233  WBC 9.6   < > 9.7 10.1 9.8  NEUTROABS 6.3  --   --  7.2 7.1  HGB 10.5*   < > 10.2* 9.2* 9.0*  HCT 33.6*   < > 32.4* 29.2* 27.9*  MCV 85.7  --  84.8 84.6 85.6  PLT 225   < > 241 197 194   < > = values in this interval not displayed.   Studies/Results: IR US Guide Vasc Access Right  Result Date: 12/19/2019 INDICATION: 74 year old female referred for a temporary hemodialysis catheter placement EXAM: IMAGE GUIDED PLACEMENT OF TEMPORARY HEMODIALYSIS CATHETER MEDICATIONS: None ANESTHESIA/SEDATION: None FLUOROSCOPY TIME:  Fluoroscopy Time: 0 minutes 24 seconds (1 mGy). COMPLICATIONS: None PROCEDURE: Informed written consent was obtained  from the patient and the patient's family after a discussion of the risks, benefits, and alternatives to treatment. Questions regarding the procedure were encouraged and answered. The right neck was prepped with chlorhexidine in a sterile fashion, and a sterile drape was applied covering the operative field. Maximum barrier sterile technique with sterile gowns and gloves were used for the procedure. A timeout was performed prior to the initiation of the procedure. A micropuncture kit was utilized to access the right internal jugular vein under direct, real-time ultrasound guidance after the overlying soft tissues were anesthetized with 1% lidocaine with epinephrine. Ultrasound image documentation was performed. The microwire was kinked to measure appropriate catheter length. A stiff glidewire was advanced to the level of the IVC. A 20 cm hemodialysis catheter was then placed over the wire. Final catheter positioning was confirmed and documented with a spot radiographic image. The catheter aspirates and flushes normally. The catheter was flushed with appropriate volume heparin dwells. Dressings were applied. The patient tolerated the procedure well without immediate post procedural complication. IMPRESSION: Status post image guided placement of a right IJ temporary hemodialysis catheter. Signed, Dulcy Fanny. Dellia Nims, [place] [facility] Radiology Electronically Signed   By: Corrie Mckusick D.O.  On: 12/19/2019 16:53   IR TUNNELED CENTRAL VENOUS CATHETER PLACEMENT  Result Date: 12/19/2019 INDICATION: 74 year old female referred for a temporary hemodialysis catheter placement EXAM: IMAGE GUIDED PLACEMENT OF TEMPORARY HEMODIALYSIS CATHETER MEDICATIONS: None ANESTHESIA/SEDATION: None FLUOROSCOPY TIME:  Fluoroscopy Time: 0 minutes 24 seconds (1 mGy). COMPLICATIONS: None PROCEDURE: Informed written consent was obtained from the patient and the patient's family after a  discussion of the risks, benefits, and alternatives to treatment. Questions regarding the procedure were encouraged and answered. The right neck was prepped with chlorhexidine in a sterile fashion, and a sterile drape was applied covering the operative field. Maximum barrier sterile technique with sterile gowns and gloves were used for the procedure. A timeout was performed prior to the initiation of the procedure. A micropuncture kit was utilized to access the right internal jugular vein under direct, real-time ultrasound guidance after the overlying soft tissues were anesthetized with 1% lidocaine with epinephrine. Ultrasound image documentation was performed. The microwire was kinked to measure appropriate catheter length. A stiff glidewire was advanced to the level of the IVC. A 20 cm hemodialysis catheter was then placed over the wire. Final catheter positioning was confirmed and documented with a spot radiographic image. The catheter aspirates and flushes normally. The catheter was flushed with appropriate volume heparin dwells. Dressings were applied. The patient tolerated the procedure well without immediate post procedural complication. IMPRESSION: Status post image guided placement of a right IJ temporary hemodialysis catheter. Signed, Dulcy Fanny. Dellia Nims, [facility] Radiology Electronically Signed   By: Corrie Mckusick D.O.   On: 12/19/2019 16:53   Medications: . sodium chloride    . sodium chloride     . acetaminophen  650 mg Rectal Q8H   Or  . acetaminophen  650 mg Oral Q8H  . amLODipine  10 mg Oral Daily  . Chlorhexidine Gluconate Cloth  6 each Topical Q0600  . escitalopram  10 mg Oral Daily  . influenza vaccine adjuvanted  0.5 mL Intramuscular Tomorrow-1000  . lidocaine  1 patch Transdermal Q24H  . ramelteon  8 mg Oral QHS    Dialysis Orders: MWF [facility]  3h 20mn   400/800    70.5kg    2 /2.25 bath  LUE AVG  Hep none - Mircera  1512m IV q 2 weeks - Calcitriol 0.2532mPO q HD  Assessment/Plan: 1. C7 + R 7-11th rib fractures: Pain control per primary. Cervical Fx deemed chronic in nature.  2. Fall/ possible syncopal episode:Was after dialysis, possibly mild volume depletion. Per primary. 3. Enlarged thoracic aorta: Will f/u with [facility] as outpatient. 4. Hyperkalemia: On admit, resolved with HD. 5. HD access: Clotted LUE AVG. IR placed temp HD cath 10/18, for declot today. Appreciate IR assistance.  6. ESRD:Continue per usual MWF schedule - HD now. 7. Hypertension/volume:BP controlled - minimal edema. UF as tolerated. Wt's are questionable.  8. Anemia:Hgb 9 - will redose ESA today. 9. Metabolic bone disease:Ca coming down, resume VDRA. 10. T2DM: Per primary.   KatVeneta PentonA-C 12/21/2019, 8:11 AM  [employer]

## 2019-12-21 NOTE — Sedation Documentation (Signed)
37.5 mcg fentanyl 0.5mg  versed Total time 44 mins  Report called to Floor RN. Pt is awake and vitals stable at this time

## 2019-12-21 NOTE — Sedation Documentation (Signed)
Patient is resting comfortably. 

## 2019-12-21 NOTE — Plan of Care (Signed)
Patient is scheduled to have left upper ext AV fistula declotted in AM by IR. Patient has been NPO after MN except for sips with meds. Patient is a high fall risk so bed alarm and floor mats are in place per fall precautions. Right rib pain has been controlled with PRN meds as ordered. Will continue to monitor and continue current POC.

## 2019-12-21 NOTE — Progress Notes (Signed)
OT Cancellation Note  Patient Details Name: Bianca Myers MRN: [id number] DOB: 1945-06-14   Cancelled Treatment:    Reason Eval/Treat Not Completed: Patient at procedure or test/ unavailable. Pt off unit at HD. Will re-attempt OT eval later this afternoon as time allows.   Layla Maw 12/21/2019, 7:34 AM

## 2019-12-21 NOTE — Progress Notes (Signed)
PT Cancellation Note  Patient Details Name: Daylyn Christine MRN: [id number] DOB: 1946-01-25   Cancelled Treatment:    Reason Eval/Treat Not Completed: Patient at procedure or test/unavailable Per RN, pt at procedure this afternoon. Pt also was at HD in AM. Will continue efforts per PT POC as schedule permits.  Rayburn Mundis M Owenn Rothermel 12/21/2019, 2:16 PM

## 2019-12-21 NOTE — Progress Notes (Signed)
RE: Bianca Myers Date of Birth:  Apr 18, 1945 Date: 12/21/2019  Please be advised that the above-named patient will require a short-term nursing home stay - anticipated 30 days or less for rehabilitation and strengthening.  The plan is for return home.

## 2019-12-21 NOTE — Sedation Documentation (Signed)
Procedure finished. Pt tolerated well.

## 2019-12-22 DIAGNOSIS — K5900 Constipation, unspecified: Secondary | ICD-10-CM

## 2019-12-22 DIAGNOSIS — R000 Tachycardia, unspecified: Secondary | ICD-10-CM

## 2019-12-22 LAB — BASIC METABOLIC PANEL
Anion gap: 12 (ref 5–15)
BUN: 29 mg/dL — ABNORMAL HIGH (ref 8–23)
CO2: 26 mmol/L (ref 22–32)
Calcium: 8.8 mg/dL — ABNORMAL LOW (ref 8.9–10.3)
Chloride: 97 mmol/L — ABNORMAL LOW (ref 98–111)
Creatinine, Ser: 4.95 mg/dL — ABNORMAL HIGH (ref 0.44–1.00)
GFR, Estimated: 8 mL/min — ABNORMAL LOW (ref >60)
Glucose, Bld: 129 mg/dL — ABNORMAL HIGH (ref 70–99)
Potassium: 3.9 mmol/L (ref 3.5–5.1)
Sodium: 135 mmol/L (ref 135–145)

## 2019-12-22 MED ORDER — POLYETHYLENE GLYCOL 3350 17 G PO PACK
17.0000 g | PACK | Freq: Two times a day (BID) | ORAL | Status: DC
  Administered 2019-12-22 – 2019-12-24 (×2): 17 g via ORAL
  Filled 2019-12-22 (×5): qty 1

## 2019-12-22 MED ORDER — POLYETHYLENE GLYCOL 3350 17 G PO PACK
17.0000 g | PACK | Freq: Every day | ORAL | Status: DC
  Administered 2019-12-22: 17 g via ORAL
  Filled 2019-12-22: qty 1

## 2019-12-22 MED ORDER — SENNOSIDES-DOCUSATE SODIUM 8.6-50 MG PO TABS
2.0000 | ORAL_TABLET | Freq: Two times a day (BID) | ORAL | Status: DC
  Administered 2019-12-22 – 2019-12-25 (×6): 2 via ORAL
  Filled 2019-12-22 (×8): qty 2

## 2019-12-22 NOTE — TOC CAGE-AID Note (Signed)
Transition of Care [facility]) - CAGE-AID Screening   Patient Details  Name: Bianca Myers MRN: [id number] Date of Birth: 1945/05/21  Transition of Care [facility]) CM/SW Contact:    Emeterio Reeve, [place] Phone Number: 12/22/2019, 1:23 PM   Clinical Narrative:  CSW met with pt at bedside. CSW introduced self and explained role at the hospital.  Pt denies alcohol use. Pt denies substance use. Pt did not need any resources at this time.    CAGE-AID Screening:    Have You Ever Felt You Ought to Cut Down on Your Drinking or Drug Use?: No Have People Annoyed You By Critizing Your Drinking Or Drug Use?: No Have You Felt Bad Or Guilty About Your Drinking Or Drug Use?: No Have You Ever Had a Drink or Used Drugs First Thing In The Morning to Steady Your Nerves or to Get Rid of a Hangover?: No CAGE-AID Score: 0  Substance Abuse Education Offered: No    Blima Ledger, [place] Social Worker [phone]

## 2019-12-22 NOTE — Progress Notes (Signed)
   Subjective: The patient reports that she is having constipation and continues to have 9/10 pain. Patient is able to sit up right on the bedside commode this AM.  Objective:  Vital signs in last 24 hours: Vitals:   12/21/19 1847 12/22/19 0000 12/22/19 0337 12/22/19 0746  BP: (!) 141/87 140/80 (!) 165/80 (!) 143/82  Pulse: 85 80 81 80  Resp: 19 17 19 16   Temp: 98.4 F (36.9 C) 98 F (36.7 C) 98.3 F (36.8 C) 98.4 F (36.9 C)  TempSrc:  Oral Oral Oral  SpO2: 95% 98% 96% 97%  Weight:      Height:       Physical Exam Constitutional:      Comments: On commode having obviously hard time having BM.  HENT:     Head: Normocephalic and atraumatic.  Cardiovascular:     Rate and Rhythm: Regular rhythm. Tachycardia present.  Pulmonary:     Comments: Patient still has significant pain with deep breaths, decent air movement on exam in all lobes. Abdominal:     General: Bowel sounds are normal.     Palpations: Abdomen is soft.     Comments: Obvious constipation  Neurological:     Mental Status: She is alert.     Assessment/Plan:  Principal Problem:   Fall Active Problems:   End-stage renal disease on hemodialysis ([place])   Diabetes mellitus ([place])   Multiple rib fractures   Hyperkalemia   Thoracic ascending aortic aneurysm ([place])  Fractured ribs and Cervical vertebra Patient had a mechanical fall. She is in significant pain and was found to have displaced fractures of the posterior right 7th-11th ribs and a trace pleural effusion.Patient required 20mg  OxyIR over the past 24h.  - Pain mgmt: Tylenol 650 q6 PRN, OxyIR 5mg -10mg  q4h PRN Dilaudid 0.5mg severe pain Lidocaine patch Robaxin 500mg  q 6 PRN - Incentive spirometry - Pulm toilet -PT/OT - Consult TOC for SNF placement  Fall Etiology for fall appears to be multifactorial at this time. Patient has a history of dizziness after HD. Patient also appears to require assistance with mobility at baseline.  Patient reported that she normally waits for her aid to help her. PT and OT recommend SNF . - F/u BMP - PT/OT  HTN BP range 143/82 with HR of 80. Patient has a history of HTN. Home medications: Norvasc 10mg , Labetalol 200mg  BID -Restarted norvasc - Continue to hold Labetalol will monitor patient BP and HR.  Mood disorder Patient home medication includes Lexapro 10mg . -Continue home Lexapro 10mg   HLD Patient home med Lipitor 40mg  T, TH, S, Sun. - Continue regimen when patient is clear for diet  ESRD (MWF) Patient is ESRD with HD on MWF. AVGG was successfully declotted yesterday. Patient will need removal of her temporary cath. Scheduled for HD tom.  -Nephro has been consulted appreciate recc's - trend CBC - IV consult for temp cath removal  Constipation Patient reporting constipation. - Miralax - senna-docusate  Prior to Admission Living Arrangement:Home Anticipated Discharge Location:SNF Barriers to Discharge:Treatament Dispo: Anticipated discharge in approximately1-2day(s).   Freida Busman, MD 12/22/2019, 12:18 PM Pager: [phone] After 5pm on weekdays and 1pm on weekends: On Call pager [phone]

## 2019-12-22 NOTE — TOC Progression Note (Signed)
Transition of Care [facility]) - Progression Note    Patient Details  Name: Bianca Myers MRN: [id number] Date of Birth: January 11, 1946  Transition of Care [facility]) CM/SW [place], [place] Phone Number: 12/22/2019, 1:58 PM  Clinical Narrative:     CSW spoke to pt about discharge planning. Pt gave csw permission to fax out to facilities. Pt is still unsure of plans.   Expected Discharge Plan: [place] (vs [facility]) Barriers to Discharge: Continued Medical Work up  Expected Discharge Plan and Services Expected Discharge Plan: [place] (vs [facility])         Expected Discharge Date: 12/19/19                                     Social Determinants of Health (SDOH) Interventions    Readmission Risk Interventions No flowsheet data found.  Emeterio Reeve, Latanya Presser, [place] Social Worker [phone]

## 2019-12-22 NOTE — Progress Notes (Signed)
Physical Therapy Treatment Patient Details Name: Bianca Myers MRN: [id number] DOB: October 21, 1945 Today's Date: 12/22/2019    History of Present Illness Bianca Myers is a 74 y.o. female with ESRD, HTN, T2DM, and gout who was admitted with rib and cervical fracture and hyperkalemia. Found to have  Right 7-11 Rib fxs, old C7 cervical compression fx, Subacute to chronic fracture deformities of the right pubis; Nonacute fracture deformities of the right hemisacrum.  Clogged HD catheter with missed sessions therefore pt going for temporary neck catheter 10/18.       PT Comments    Pt participated in PT session with max encouragement after being premedicated by RN, up on bedside commode with nsg staff present upon PTA arrival to room. Pt performed stand pivot transfer using RW with minA, able to stand x4 total reps from EOB/BSC with cues for safety and technique and able to assist with peri-care but needs assist for completion, needing 1-2 UE support while static standing at walker for cleanup. Pt performed bed mobility with minA. Pt given extensive instruction on importance of continuing mobility and pressure offloading strategies due to prolonged time in bed and severe pain. Cues for pursed-lip breathing strategies throughout due to occasional SpO2 desat/increased WOB with pain/mobility. Pt continues to benefit from PT services to progress toward functional mobility goals. D/C recs below remain appropriate.  Follow Up Recommendations  SNF;Supervision/Assistance - 24 hour     Equipment Recommendations  Other (comment) (TBA)    Recommendations for Other Services       Precautions / Restrictions Precautions Precautions: Fall Precaution Comments: rib fx pain Restrictions Weight Bearing Restrictions: No    Mobility  Bed Mobility Overal bed mobility: Needs Assistance Bed Mobility: Sit to Supine;Rolling Rolling: Min assist     Sit to supine: Min assist   General bed mobility  comments: cues for self-assist  Transfers Overall transfer level: Needs assistance Equipment used: Rolling walker (2 wheeled) Transfers: Stand Pivot Transfers   Stand pivot transfers: Min assist       General transfer comment: from [facility] to EOB, using RW, flexed posture but able to bear weight through BUE to assist with balance  Ambulation/Gait                 Stairs             Wheelchair Mobility    Modified Rankin (Stroke Patients Only)       Balance Overall balance assessment: Needs assistance Sitting-balance support: Bilateral upper extremity supported;Feet supported Sitting balance-Leahy Scale: Fair Sitting balance - Comments: relies on light BUE support for EOB sitting   Standing balance support: Bilateral upper extremity supported Standing balance-Leahy Scale: Poor Standing balance comment: during SPT from BSC to EOB, no LOB but needs assist to manage RW                            Cognition Arousal/Alertness: Awake/alert Behavior During Therapy: Anxious;Flat affect Overall Cognitive Status: No family/caregiver present to determine baseline cognitive functioning Area of Impairment: Attention;Memory;Following commands;Safety/judgement;Awareness;Problem solving                   Current Attention Level: Selective Memory: Decreased short-term memory Following Commands: Follows one step commands with increased time Safety/Judgement: Decreased awareness of safety;Decreased awareness of deficits Awareness: Emergent Problem Solving: Decreased initiation;Difficulty sequencing;Requires verbal cues;Requires tactile cues General Comments: inconsistent pain response for voluntary/involuntary movement; pt self-limiting but once given stronger pain meds (dilaudid), able  to move around better      Exercises Other Exercises Other Exercises: Reviewed supine BLE therex, pt given HEP (see assessment) packet and encouraged to perform TID if  possible    General Comments General comments (skin integrity, edema, etc.): SpO2 varying 76 to 96% on RA, however inconsistent waveform when desat and improves with cues for slowed breathing and keeping hand with sensor still. Mostly >92% during mobility      Pertinent Vitals/Pain Pain Assessment: Faces Pain Score: 10-Worst pain ever Faces Pain Scale: Hurts worst Pain Location: right ribs, R shoulder/pectoral region, R hip Pain Descriptors / Indicators: Aching;Discomfort;Grimacing;Guarding;Radiating;Moaning Pain Intervention(s): Limited activity within patient's tolerance;Monitored during session;Premedicated before session;Repositioned;Ice applied    Home Living Family/patient expects to be discharged to:: Private residence Living Arrangements: Children Available Help at Discharge: Family;Available 24 hours/day Type of Home: House Home Access: Stairs to enter Entrance Stairs-Rails: None Home Layout: One level Home Equipment: Environmental consultant - 2 wheels;Cane - single point;Bedside commode;Shower seat;Wheelchair - manual      Prior Function Level of Independence: Independent with assistive device(s)      Comments: Pt reports Modified Independent for ADLs, mobility with RW. Son completes most IADLs but pt cooks some. Son was dirivng    PT Goals (current goals can now be found in the care plan section) Acute Rehab PT Goals Patient Stated Goal: to get rid of pain PT Goal Formulation: With patient Time For Goal Achievement: 01/02/20 Potential to Achieve Goals: Good Progress towards PT goals: Progressing toward goals    Frequency    Min 3X/week      PT Plan Current plan remains appropriate    Co-evaluation              AM-PAC PT "6 Clicks" Mobility   Outcome Measure  Help needed turning from your back to your side while in a flat bed without using bedrails?: A Little Help needed moving from lying on your back to sitting on the side of a flat bed without using bedrails?: A  Little Help needed moving to and from a bed to a chair (including a wheelchair)?: A Little Help needed standing up from a chair using your arms (e.g., wheelchair or bedside chair)?: A Little Help needed to walk in hospital room?: A Lot Help needed climbing 3-5 steps with a railing? : Total 6 Click Score: 15    End of Session   Activity Tolerance: Patient limited by fatigue;Patient limited by pain Patient left: in bed;with call bell/phone within reach;with bed alarm set Nurse Communication: Mobility status PT Visit Diagnosis: Muscle weakness (generalized) (M62.81);History of falling (Z91.81);Pain Pain - Right/Left: Right Pain - part of body:  (ribs)     Time: 8003-4917 PT Time Calculation (min) (ACUTE ONLY): 23 min  Charges:  $Therapeutic Activity: 23-37 mins                     Trell Secrist P., PTA Acute Rehabilitation Services Pager: [phone] Office: [place] 12/22/2019, 1:45 PM

## 2019-12-22 NOTE — Evaluation (Signed)
Occupational Therapy Evaluation Patient Details Name: Bianca Myers MRN: [id number] DOB: [birth year] Today's Date: 12/22/2019    History of Present Illness Bianca Myers is a 74 y.o. female with ESRD, HTN, T2DM, and gout who was admitted with rib and cervical fracture and hyperkalemia. Found to have  Right 7-11 Rib fxs, old C7 cervical compression fx, Subacute to chronic fracture deformities of the right pubis; Nonacute fracture deformities of the right hemisacrum.  Clogged HD catheter with missed sessions therefore pt going for temporary neck catheter 10/18.      Clinical Impression   PTA, pt lives with son and reports Modified Independence with ADLs and mobility using RW. Son assists with IADLs. Pt presents now with deficits in strength, balance, endurance, cognition. Session greatly limited by pt's reported amount of severe pain with efforts to improve comfort. Pt noted to demonstrate inconsistent functional abilities - unable to lift arm to place pillow and ice pack, but later observed to lift R UE for RN to scan wrist band for pain medication. Pt ultimately declined OOB attempts today. Based on today's observations, pt demonstrates need for up to Total A for LB ADLs. Recommend SNF for short term rehab prior to discharge home to maximize safety. Plan for OOB activity attempts during next session.    Follow Up Recommendations  SNF;Supervision/Assistance - 24 hour    Equipment Recommendations  None recommended by OT (appears to have all needed DME)    Recommendations for Other Services       Precautions / Restrictions Precautions Precautions: Fall Precaution Comments: rib fx pain Restrictions Weight Bearing Restrictions: No      Mobility Bed Mobility               General bed mobility comments: Declined EOB activites due to too much pain, Min A for repositioning ice pack under arm    Transfers                 General transfer comment: pt refused     Balance                                           ADL either performed or assessed with clinical judgement   ADL Overall ADL's : Needs assistance/impaired Eating/Feeding: Minimal assistance;Bed level Eating/Feeding Details (indicate cue type and reason): Reports/demo inability to use R UE initially but then able to reach for cup and bring to mouth at end of session  Grooming: Minimal assistance;Bed level   Upper Body Bathing: Maximal assistance;Bed level   Lower Body Bathing: Maximal assistance;Bed level   Upper Body Dressing : Maximal assistance;Bed level   Lower Body Dressing: Maximal assistance;Bed level       Toileting- Clothing Manipulation and Hygiene: Total assistance;Bed level         General ADL Comments: Based on pt presentation and pain, requiring increased assist for ADLs. Suspect pt can do more than she demonstrated today     Vision Baseline Vision/History: No visual deficits Patient Visual Report: No change from baseline Vision Assessment?: No apparent visual deficits     Perception     Praxis      Pertinent Vitals/Pain Pain Assessment: 0-10 Pain Score: 10-Worst pain ever Pain Location: right ribs Pain Descriptors / Indicators: Aching;Discomfort;Grimacing;Guarding Pain Intervention(s): Limited activity within patient's tolerance;Monitored during session;Patient requesting pain meds-RN notified;RN gave pain meds during session     Hand  Dominance Right   Extremity/Trunk Assessment Upper Extremity Assessment Upper Extremity Assessment: Generalized weakness;RUE deficits/detail;LUE deficits/detail RUE Deficits / Details: hand, wrist, elbow WFL. active sld flex to 30* RUE: Unable to fully assess due to pain LUE Deficits / Details: UE used for dialysis, reports/demo active shl flex to 45*, PROM to 90*   Lower Extremity Assessment Lower Extremity Assessment: Defer to PT evaluation       Communication Communication Communication: No  difficulties   Cognition Arousal/Alertness: Awake/alert Behavior During Therapy: Flat affect Overall Cognitive Status: No family/caregiver present to determine baseline cognitive functioning Area of Impairment: Attention;Memory;Following commands;Safety/judgement;Awareness;Problem solving                   Current Attention Level: Selective Memory: Decreased short-term memory Following Commands: Follows one step commands with increased time Safety/Judgement: Decreased awareness of safety;Decreased awareness of deficits Awareness: Emergent Problem Solving: Decreased initiation;Difficulty sequencing;Requires verbal cues;Requires tactile cues General Comments: Pt with tangential conversation, decreased awareness of deficits. Inconsistent pain response for voluntary vs involuntary movement   General Comments  VSS on RA. Pt reporting too much pain to participate, when therapist actively attempting to assess ROM or movement, pt unable to do much but when handed cup, pt demo improved movement with involuntary actions    Exercises     Shoulder Instructions      Home Living Family/patient expects to be discharged to:: Private residence Living Arrangements: Children Available Help at Discharge: Family;Available 24 hours/day Type of Home: House Home Access: Stairs to enter [employer] of Steps: 2 Entrance Stairs-Rails: None Home Layout: One level     Bathroom Shower/Tub: Tub/shower unit;Walk-in shower   Bathroom Toilet: Standard     Home Equipment: Environmental consultant - 2 wheels;Cane - single point;Bedside commode;Shower seat;Wheelchair - manual          Prior Functioning/Environment Level of Independence: Independent with assistive device(s)        Comments: Pt reports Modified Independent for ADLs, mobility with RW. Son completes most IADLs but pt cooks some. Son was dirivng         OT Problem List: Decreased strength;Decreased activity tolerance;Impaired balance  (sitting and/or standing);Decreased safety awareness;Pain;Impaired UE functional use      OT Treatment/Interventions: Therapeutic exercise;Self-care/ADL training;DME and/or AE instruction;Therapeutic activities;Patient/family education    OT Goals(Current goals can be found in the care plan section) Acute Rehab OT Goals Patient Stated Goal: to get rid of pain OT Goal Formulation: With patient Time For Goal Achievement: 01/05/20 Potential to Achieve Goals: Fair ADL Goals Pt Will Perform Eating: with modified independence;sitting Pt Will Perform Grooming: with modified independence;standing Pt Will Perform Lower Body Bathing: with min assist;sit to/from stand;sitting/lateral leans Pt Will Perform Lower Body Dressing: with min assist;sitting/lateral leans;sit to/from stand Pt Will Transfer to Toilet: with min assist;bedside commode;stand pivot transfer Pt Will Perform Toileting - Clothing Manipulation and hygiene: with min assist;sit to/from stand;sitting/lateral leans Additional ADL Goal #1: Pt to demonstrate ability to sit EOB >5 min with no assist to maintain balance in order to maximize ADL participation  OT Frequency: Min 2X/week   Barriers to D/C:            Co-evaluation              AM-PAC OT "6 Clicks" Daily Activity     Outcome Measure Help from another person eating meals?: A Little Help from another person taking care of personal grooming?: A Little Help from another person toileting, which includes using toliet, bedpan, or urinal?:  Total Help from another person bathing (including washing, rinsing, drying)?: A Lot Help from another person to put on and taking off regular upper body clothing?: A Little Help from another person to put on and taking off regular lower body clothing?: A Lot 6 Click Score: 14   End of Session Nurse Communication: Mobility status;Patient requests pain meds  Activity Tolerance: Patient limited by pain Patient left: in bed;with call  bell/phone within reach;with bed alarm set  OT Visit Diagnosis: Unsteadiness on feet (R26.81);Other abnormalities of gait and mobility (R26.89);Muscle weakness (generalized) (M62.81);History of falling (Z91.81);Pain Pain - Right/Left: Right Pain - part of body:  (ribs)                Time: 1043-1106 OT Time Calculation (min): 23 min Charges:  OT General Charges $OT Visit: 1 Visit OT Evaluation $OT Eval Moderate Complexity: 1 Mod OT Treatments $Therapeutic Activity: 8-22 mins  Layla Maw, OTR/L  Layla Maw 12/22/2019, 11:25 AM

## 2019-12-22 NOTE — Progress Notes (Signed)
[facility] Progress Note   Subjective:  Seen in room. S/p declot of L AVG yesterday. Still with rib pain. Per primary team, may need short-term SNF until rib Fx pain resolved. Still with temp IJ line.   Objective Vitals:   12/21/19 1847 12/22/19 0000 12/22/19 0337 12/22/19 0746  BP: (!) 141/87 140/80 (!) 165/80 (!) 143/82  Pulse: 85 80 81 80  Resp: 19 17 19 16   Temp: 98.4 F (36.9 C) 98 F (36.7 C) 98.3 F (36.8 C) 98.4 F (36.9 C)  TempSrc:  Oral Oral Oral  SpO2: 95% 98% 96% 97%  Weight:      Height:       Physical Exam General: Well appearing woman, NAD Heart: RRR; no murmur Lungs: CTA anteriorly Abdomen: soft, non-tender Extremities: No LE edema Dialysis Access: LUE AVG with strong bruit, temp HD cath in R IJ  Additional Objective Labs: Basic Metabolic Panel: Recent Labs  Lab 12/20/19 1700 12/21/19 0233 12/22/19 0437  NA 133* 132* 135  K 4.4 4.5 3.9  CL 91* 93* 97*  CO2 27 25 26   GLUCOSE 122* 131* 129*  BUN 36* 46* 29*  CREATININE 6.37* 7.19* 4.95*  CALCIUM 8.7* 8.7* 8.8*   Liver Function Tests: Recent Labs  Lab 12/17/19 1954  AST 17  ALT 16  ALKPHOS 131*  BILITOT 0.5  PROT 7.2  ALBUMIN 3.6   CBC: Recent Labs  Lab 12/17/19 1954 12/17/19 1954 12/19/19 0615 12/20/19 1700 12/21/19 0233  WBC 9.6   < > 9.7 10.1 9.8  NEUTROABS 6.3  --   --  7.2 7.1  HGB 10.5*   < > 10.2* 9.2* 9.0*  HCT 33.6*   < > 32.4* 29.2* 27.9*  MCV 85.7  --  84.8 84.6 85.6  PLT 225   < > 241 197 194   < > = values in this interval not displayed.   Studies/Results: IR US Guide Vasc Access Left  Result Date: 12/21/2019 INDICATION: 74 year old female with history of end-stage renal disease on hemodialysis with clinical suspicion for thrombosis of left upper extremity arteriovenous graft. EXAM: 1. Ultrasound-guided antegrade venous access of left upper extremity arteriovenous graft 2. Pharmacomechanical thrombolysis of left upper extremity arteriovenous  graft 3. Ultrasound-guided retrograde venous access of left upper extremity arteriovenous graft 4. Arteriovenous anastomosis balloon thrombectomy 5. Rheolytic thrombectomy of left upper extremity arteriovenous graft 6. Balloon maceration of left upper extremity arteriovenous graft 7. Completion fistulogram of left upper extremity graft COMPARISON:  None. MEDICATIONS: 4000 units heparin, intravenous CONTRAST:  49mL OMNIPAQUE IOHEXOL 300 MG/ML  SOLN ANESTHESIA/SEDATION: Moderate (conscious) sedation was employed during this procedure. A total of Versed 0.5 mg and Fentanyl 37.5 mcg was administered intravenously. Moderate Sedation Time: 44 minutes. The patient's level of consciousness and vital signs were monitored continuously by radiology nursing throughout the procedure under my direct supervision. FLUOROSCOPY TIME:  6 minutes, 24 seconds, 3 mGy COMPLICATIONS: None immediate. PROCEDURE: Informed written consent was obtained from the patient after a discussion of the risk, benefits and alternatives to treatment. Questions regarding the procedure were encouraged and answered. A timeout was performed prior to the initiation of the procedure. The left arm dialysis graft was prepped with Chlorhexidine in a sterile fashion, and a sterile drape was applied covering the operative field. Preprocedure ultrasound demonstrated complete thrombosis of the visualized portion indwelling brachial axillary graft. Using ultrasound guidance, 21 grade should micropuncture needle was inserted into the graft in antegrade fashion near the arterial anastomosis. A Wholey wire  was then directed to the central veins over which the micropuncture sheath was exchanged for a 6 French sheath. A 5 [place] Kumpe the catheter was then directed to the level of the innominate vein. Hand injection of contrast was performed as the catheter was withdrawn to delineate the central aspect of the thrombosed graft which was at the level of the axillary vein.  Next, pharmacomechanical thrombolysis was performed with 8 mg tPA admixed into 250 mg of heparinized saline. A total of 100 mg of the solution was administered from the antegrade sheath to the central portion of the occluded axillary vein. The tPA was allowed to dwell. Next, retrograde access was obtained with ultrasound guidance and a 21 gauge needle. A Glidewire was directed into the brachial artery, over which a 6 French sheath was placed. Fogarty balloon thrombectomy was performed with 3 sweeping passes through the arterial anastomosis. Next, rheolytic thrombectomy was performed through the venous outflow from the antegrade sheath. This was tolerated well without physiologic reactive changes. This was followed by balloon maceration with a 7 mm x 4 cm mustang balloon from the peripheral aspect of the antegrade access to the axillary vein. Fistulagram was then performed from the antegrade sheath which demonstrated brisk antegrade flow without flow-limiting stenosis in the outflow portion of the graft. Retrograde access was then obtained into the brachial vein just proximal to the anastomosis with a Glidewire and Kumpe the catheter. Hand injection through the Kumpe the catheter demonstrated patent graft with brisk outflow. The sheaths were removed and hemostasis obtained with application of a 2-0 Prolene pursestring suture which will be removed at the patient's next dialysis session. A dressing was placed. The patient tolerated the procedure well without immediate postprocedural complication. FINDINGS: Occluded indwelling brachial axillary graft. Successful declot as described above. Widely patent graft after declot. IMPRESSION: 1. Occluded indwelling left brachial artery to axillary vein dialysis graft. 2. Successful pharmacomechanical thrombolysis, arterial balloon thrombectomy, rheolytic thrombectomy, balloon maceration for recanalization of the left upper extremity graft. ACCESS: This access remains amenable to  future percutaneous interventions as clinically indicated. Ruthann Cancer, MD [facility] Electronically Signed   By: Ruthann Cancer MD   On: 12/21/2019 18:11   IR THROMBECTOMY AV FISTULA W/THROMBOLYSIS/PTA INC/SHUNT/IMG LEFT  Result Date: 12/21/2019 INDICATION: 74 year old female with history of end-stage renal disease on hemodialysis with clinical suspicion for thrombosis of left upper extremity arteriovenous graft. EXAM: 1. Ultrasound-guided antegrade venous access of left upper extremity arteriovenous graft 2. Pharmacomechanical thrombolysis of left upper extremity arteriovenous graft 3. Ultrasound-guided retrograde venous access of left upper extremity arteriovenous graft 4. Arteriovenous anastomosis balloon thrombectomy 5. Rheolytic thrombectomy of left upper extremity arteriovenous graft 6. Balloon maceration of left upper extremity arteriovenous graft 7. Completion fistulogram of left upper extremity graft COMPARISON:  None. MEDICATIONS: 4000 units heparin, intravenous CONTRAST:  27mL OMNIPAQUE IOHEXOL 300 MG/ML  SOLN ANESTHESIA/SEDATION: Moderate (conscious) sedation was employed during this procedure. A total of Versed 0.5 mg and Fentanyl 37.5 mcg was administered intravenously. Moderate Sedation Time: 44 minutes. The patient's level of consciousness and vital signs were monitored continuously by radiology nursing throughout the procedure under my direct supervision. FLUOROSCOPY TIME:  6 minutes, 24 seconds, 3 mGy COMPLICATIONS: None immediate. PROCEDURE: Informed written consent was obtained from the patient after a discussion of the risk, benefits and alternatives to treatment. Questions regarding the procedure were encouraged and answered. A timeout was performed prior to the initiation of the procedure. The left arm dialysis graft was  prepped with Chlorhexidine in a sterile fashion, and a sterile drape was applied covering the operative  field. Preprocedure ultrasound demonstrated complete thrombosis of the visualized portion indwelling brachial axillary graft. Using ultrasound guidance, 21 grade should micropuncture needle was inserted into the graft in antegrade fashion near the arterial anastomosis. A Wholey wire was then directed to the central veins over which the micropuncture sheath was exchanged for a 6 [place] sheath. A 5 [place] Kumpe the catheter was then directed to the level of the innominate vein. Hand injection of contrast was performed as the catheter was withdrawn to delineate the central aspect of the thrombosed graft which was at the level of the axillary vein. Next, pharmacomechanical thrombolysis was performed with 8 mg tPA admixed into 250 mg of heparinized saline. A total of 100 mg of the solution was administered from the antegrade sheath to the central portion of the occluded axillary vein. The tPA was allowed to dwell. Next, retrograde access was obtained with ultrasound guidance and a 21 gauge needle. A Glidewire was directed into the brachial artery, over which a 6 French sheath was placed. Fogarty balloon thrombectomy was performed with 3 sweeping passes through the arterial anastomosis. Next, rheolytic thrombectomy was performed through the venous outflow from the antegrade sheath. This was tolerated well without physiologic reactive changes. This was followed by balloon maceration with a 7 mm x 4 cm mustang balloon from the peripheral aspect of the antegrade access to the axillary vein. Fistulagram was then performed from the antegrade sheath which demonstrated brisk antegrade flow without flow-limiting stenosis in the outflow portion of the graft. Retrograde access was then obtained into the brachial vein just proximal to the anastomosis with a Glidewire and Kumpe the catheter. Hand injection through the Kumpe the catheter demonstrated patent graft with brisk outflow. The sheaths were removed and hemostasis obtained  with application of a 2-0 Prolene pursestring suture which will be removed at the patient's next dialysis session. A dressing was placed. The patient tolerated the procedure well without immediate postprocedural complication. FINDINGS: Occluded indwelling brachial axillary graft. Successful declot as described above. Widely patent graft after declot. IMPRESSION: 1. Occluded indwelling left brachial artery to axillary vein dialysis graft. 2. Successful pharmacomechanical thrombolysis, arterial balloon thrombectomy, rheolytic thrombectomy, balloon maceration for recanalization of the left upper extremity graft. ACCESS: This access remains amenable to future percutaneous interventions as clinically indicated. Ruthann Cancer, MD [facility] Electronically Signed   By: Ruthann Cancer MD   On: 12/21/2019 18:11   Medications:  . acetaminophen  650 mg Rectal Q8H   Or  . acetaminophen  650 mg Oral Q8H  . alteplase  8 mg Intracatheter Once  . amLODipine  10 mg Oral Daily  . calcitRIOL  0.25 mcg Oral Once per day on Mon Wed Fri  . Chlorhexidine Gluconate Cloth  6 each Topical Q0600  . darbepoetin (ARANESP) injection - DIALYSIS  60 mcg Intravenous Q Wed-HD  . escitalopram  10 mg Oral Daily  . influenza vaccine adjuvanted  0.5 mL Intramuscular Tomorrow-1000  . lidocaine  1 patch Transdermal Q24H  . polyethylene glycol  17 g Oral Daily  . ramelteon  8 mg Oral QHS  . senna-docusate  2 tablet Oral BID   Dialysis Orders: MWF [place]  3h31min400/800 70.5kg 2 /2.25 bathLUE AVG Hepnone - Mircera 11mcg IV q 2 weeks - Calcitriol 0.22mcg PO q HD  Assessment/Plan: 1. C7 + R 7-11th rib fractures: Pain control per primary. Cervical  Fx deemed chronic in nature.  2. Fall/ possible syncopal episode:Was after dialysis, possibly mild volume depletion. Per primary. 3. Enlarged thoracic aorta: Will f/u with [facility] as outpatient. 4. Hyperkalemia: On  admit, resolved with HD. 5. HD access: Clotted LUE AVG. Temp HD cath 10/18, now s/p declot 10/20. Appreciate IR assistance. 6. ESRD:Continue per usual MWF schedule - next HD tomorrow (10/22). Will need temp line pulled before d/c - IV team can do this. 7. Hypertension/volume:BP controlled - minimal edema. UF as tolerated.Wt's are questionable. 8. Anemia:Hgb 9 - Aranesp 31mcg IV given 90/93. 9. Metabolic bone disease:Ca coming down, resumed VDRA. 10. T2DM: Per primary. 11. Dispo: Once medically cleared - either home or SNF (likely SNF per OT recommendations).  Veneta Penton, PA-C 12/22/2019, 11:38 AM  [employer]

## 2019-12-23 LAB — CBC WITH DIFFERENTIAL/PLATELET
Abs Immature Granulocytes: 0.08 10*3/uL — ABNORMAL HIGH (ref 0.00–0.07)
Basophils Absolute: 0 10*3/uL (ref 0.0–0.1)
Basophils Relative: 0 %
Eosinophils Absolute: 0.6 10*3/uL — ABNORMAL HIGH (ref 0.0–0.5)
Eosinophils Relative: 6 %
HCT: 26.6 % — ABNORMAL LOW (ref 36.0–46.0)
Hemoglobin: 8.5 g/dL — ABNORMAL LOW (ref 12.0–15.0)
Immature Granulocytes: 1 %
Lymphocytes Relative: 18 %
Lymphs Abs: 1.8 10*3/uL (ref 0.7–4.0)
MCH: 27.7 pg (ref 26.0–34.0)
MCHC: 32 g/dL (ref 30.0–36.0)
MCV: 86.6 fL (ref 80.0–100.0)
Monocytes Absolute: 1 10*3/uL (ref 0.1–1.0)
Monocytes Relative: 10 %
Neutro Abs: 6.7 10*3/uL (ref 1.7–7.7)
Neutrophils Relative %: 65 %
Platelets: 165 10*3/uL (ref 150–400)
RBC: 3.07 MIL/uL — ABNORMAL LOW (ref 3.87–5.11)
RDW: 18.6 % — ABNORMAL HIGH (ref 11.5–15.5)
WBC: 10.1 10*3/uL (ref 4.0–10.5)
nRBC: 0.3 % — ABNORMAL HIGH (ref 0.0–0.2)

## 2019-12-23 LAB — BASIC METABOLIC PANEL
Anion gap: 12 (ref 5–15)
BUN: 44 mg/dL — ABNORMAL HIGH (ref 8–23)
CO2: 25 mmol/L (ref 22–32)
Calcium: 8.6 mg/dL — ABNORMAL LOW (ref 8.9–10.3)
Chloride: 94 mmol/L — ABNORMAL LOW (ref 98–111)
Creatinine, Ser: 6.68 mg/dL — ABNORMAL HIGH (ref 0.44–1.00)
GFR, Estimated: 6 mL/min — ABNORMAL LOW (ref >60)
Glucose, Bld: 122 mg/dL — ABNORMAL HIGH (ref 70–99)
Potassium: 3.6 mmol/L (ref 3.5–5.1)
Sodium: 131 mmol/L — ABNORMAL LOW (ref 135–145)

## 2019-12-23 MED ORDER — HYDROMORPHONE HCL 2 MG PO TABS
1.0000 mg | ORAL_TABLET | ORAL | Status: DC | PRN
  Administered 2019-12-23 – 2019-12-26 (×7): 1 mg via ORAL
  Filled 2019-12-23 (×7): qty 1

## 2019-12-23 MED ORDER — OXYCODONE HCL 5 MG PO TABS
ORAL_TABLET | ORAL | Status: AC
  Administered 2019-12-23: 5 mg via ORAL
  Filled 2019-12-23: qty 1

## 2019-12-23 MED ORDER — RENA-VITE PO TABS
1.0000 | ORAL_TABLET | Freq: Every day | ORAL | Status: DC
  Administered 2019-12-23 – 2019-12-25 (×3): 1 via ORAL
  Filled 2019-12-23 (×3): qty 1

## 2019-12-23 NOTE — Progress Notes (Signed)
Subjective: Patient reports that her pain is 8/10 today. She still is not feeling great, but she does report having BM after receiving treatment yesterday for constipation. Patient continues to have intermittent right sided chest pain. Patient does not feel the OxyIR is helping her and prefers the Dilaudid.   Objective:  Vital signs in last 24 hours: Vitals:   12/22/19 0337 12/22/19 0746 12/22/19 1314 12/23/19 0330  BP: (!) 165/80 (!) 143/82 136/80 105/61  Pulse: 81 80 75 68  Resp: 19 16 16 17   Temp: 98.3 F (36.8 C) 98.4 F (36.9 C) 98.2 F (36.8 C) 98.7 F (37.1 C)  TempSrc: Oral Oral Oral Oral  SpO2: 96% 97% 98% 97%  Weight:      Height:       Physical Exam Constitutional:      Appearance: Normal appearance.     Comments: Lying in HD  HENT:     Head: Normocephalic and atraumatic.  Cardiovascular:     Rate and Rhythm: Normal rate and regular rhythm.  Pulmonary:     Effort: Pulmonary effort is normal.     Breath sounds: Normal breath sounds.     Comments: Patient remains unable to take deep breaths and continues to have significant Right sided chest pain intermittently. Musculoskeletal:        General: Tenderness present.     Comments: R chest is tender  Skin:    General: Skin is warm and dry.  Neurological:     General: No focal deficit present.     Mental Status: She is alert.     Assessment/Plan:  Principal Problem:   Fall Active Problems:   End-stage renal disease on hemodialysis ([place])   Diabetes mellitus ([place])   Multiple rib fractures   Hyperkalemia   Thoracic ascending aortic aneurysm ([place])  Fractured ribs and Cervical vertebra Patient had a mechanical fall. She is in significant pain and was found to have displaced fractures of the posterior right 7th-11th ribs and a trace pleural effusion.Patient required 20mg  OxyIR over the past 24h. Patient continues to have significant pain and reports that she does not get relief with OxyIR and prefers the  Dilaudid. We will adjust pain regimen to help control pain, but also move patient to all oral pain medication. - Pain mgmt: Tylenol 650 q6 PRN, Dilaudid 1mg  PO q4h PRN Lidocaine patch Robaxin 500mg  q 6 PRN     Discontinue OxyIR 5mg -10mg  q4h PRN - Incentive spirometry - Pulm toilet -PT/OT - Consult TOC for SNF placement  Fall Etiology for fall appears to be multifactorial at this time. Patient has a history of dizziness after HD. Patient also appears to require assistance with mobility at baseline. Patient reported that she normally waits for her aid to help her. PT and OT recommend SNF . Patient is onboard with plans for a SNF. She agrees that she needs increased rehab and aid at this time. Patient has not to walked since admission. Na is 135>131.  - F/u BMP - PT/OT - Will continue to monitor Na  HTN BP this AM 105/61 with HR of 80.Patient has a history of HTN. Home medications: Norvasc 10mg , Labetalol 200mg  BID -Restarted norvasc - Continue to hold Labetalol will monitor patient BP and HR.  Mood disorder Patient home medication includes Lexapro 10mg . -Continue homeLexapro 10mg   HLD Patient home med Lipitor 40mg  T, TH, S, Sun. - Continue regimen when patient is clear for diet  ESRD (MWF) Patient is ESRD with HD on MWF.Patient  in HD today. Temp cath was successfully removed yesterday. Hgb 10.1. -Nephro has been consulted appreciate recc's - trend CBC   Constipation Patient reporting constipation. - Miralax - senna-docusate  Prior to Admission Living Arrangement:Home Anticipated Discharge Location:SNF Barriers to Discharge:Treatament Dispo: Anticipated discharge in approximately1-2day(s).  Freida Busman, MD 12/23/2019, 8:00 AM Pager: [phone] After 5pm on weekdays and 1pm on weekends: On Call pager [phone]

## 2019-12-23 NOTE — Progress Notes (Addendum)
[facility] Progress Note   Dialysis Orders: MWF [place]  3h31min400/800 70.5kg 2 /2.25 bathLUE AVG Hepnone - Mircera 147mcg IV q 2 weeks - Calcitriol 0.52mcg PO q HD  Assessment/Plan: 1. C7 + R 7-11th rib fractures: Pain control per primary. Cervical Fxdeemedchronic in nature.  2. Fall/ possible syncopal episode:Was after dialysis, possibly mild volume depletion. Per primary. EDW recently raised 0.5  3. Enlarged thoracic aorta: Will f/u with [facility] as outpatient. 4. Hyperkalemia: On admit, resolved with HD. k 3.6 today - on 4 K bath 5. HD access:Clotted LUE AVG.Temp HD cath 10/18 - removed before use of declotted  AVGG; now s/p declot 10/20.AppreciateIRassistance.using [place] today - needs AF at outpatient unit after d/c  6. ESRD:MWF - no issues today. 7. Hypertension/volume:BP controlled - minimal edema. UF as tolerated.Wt's are questionable.Bed weight pre HD is 75.1 no volume on admission on CXR - might be able to go up more - keep SBP > 95 ADDENDUM: had BP drop on HD - keep even the rest of treatment - ^ edw at d/c 8. Anemia:Hgb9 > 8.5  - Aranesp 43mcg IV given 55/73. 9. Metabolic bone disease:[place] down, resumed VDRA. 10. T2DM: Per primary. 11. Disp: possible SNF  - info being faxed to facilities but pt still uncertain/ flu shot given this admission   Myriam Jacobson, PA-C [place] [phone] 12/23/2019,9:11 AM  LOS: 5 days   Subjective:   No complaints - doing well on HD -   Objective Vitals:   12/23/19 0750 12/23/19 0757 12/23/19 0800 12/23/19 0830  BP: 115/62 119/60 110/61 92/60  Pulse: 70 68 68 76  Resp: 18 17 12 15   Temp: 98 F (36.7 C)     TempSrc: Oral     SpO2: 96% 95% 97% 96%  Weight: 75.1 kg     Height:       Physical Exam goal 2 L General: NAD supine on HD Heart: RRR Lungs: no rales Abdomen: soft NT Extremities: no edema Dialysis Access:  Temp cath out left AVGG Qb  400   Additional Objective Labs: Basic Metabolic Panel: Recent Labs  Lab 12/21/19 0233 12/22/19 0437 12/23/19 0307  NA 132* 135 131*  K 4.5 3.9 3.6  CL 93* 97* 94*  CO2 25 26 25   GLUCOSE 131* 129* 122*  BUN 46* 29* 44*  CREATININE 7.19* 4.95* 6.68*  CALCIUM 8.7* 8.8* 8.6*   Liver Function Tests: Recent Labs  Lab 12/17/19 1954  AST 17  ALT 16  ALKPHOS 131*  BILITOT 0.5  PROT 7.2  ALBUMIN 3.6   No results for input(s): LIPASE, AMYLASE in the last 168 hours. CBC: Recent Labs  Lab 12/17/19 1954 12/17/19 1954 12/19/19 0615 12/19/19 0615 12/20/19 1700 12/21/19 0233 12/23/19 0307  WBC 9.6   < > 9.7   < > 10.1 9.8 10.1  NEUTROABS 6.3   < >  --   --  7.2 7.1 6.7  HGB 10.5*   < > 10.2*   < > 9.2* 9.0* 8.5*  HCT 33.6*   < > 32.4*   < > 29.2* 27.9* 26.6*  MCV 85.7  --  84.8  --  84.6 85.6 86.6  PLT 225   < > 241   < > 197 194 165   < > = values in this interval not displayed.   Blood Culture    Component Value Date/Time   SDES BLOOD RIGHT ANTECUBITAL 06/07/2019 2304   SPECREQUEST  06/07/2019 2304  BOTTLES DRAWN AEROBIC AND ANAEROBIC Blood Culture adequate volume   CULT  06/07/2019 2304    NO GROWTH 5 DAYS Performed at [facility], [place] [address], [place], [place] [address]    REPTSTATUS 06/13/2019 FINAL 06/07/2019 2304    Cardiac Enzymes: No results for input(s): CKTOTAL, CKMB, CKMBINDEX, TROPONINI in the last 168 hours. CBG: No results for input(s): GLUCAP in the last 168 hours. Iron Studies: No results for input(s): IRON, TIBC, TRANSFERRIN, FERRITIN in the last 72 hours. Lab Results  Component Value Date   INR 1.3 (H) 06/08/2019   INR 1.2 08/24/2018   INR 1.1 08/21/2018   Studies/Results: IR US Guide Vasc Access Left  Result Date: 12/21/2019 INDICATION: 74 year old female with history of end-stage renal disease on hemodialysis with clinical suspicion for thrombosis of left upper extremity arteriovenous graft. EXAM: 1. Ultrasound-guided  antegrade venous access of left upper extremity arteriovenous graft 2. Pharmacomechanical thrombolysis of left upper extremity arteriovenous graft 3. Ultrasound-guided retrograde venous access of left upper extremity arteriovenous graft 4. Arteriovenous anastomosis balloon thrombectomy 5. Rheolytic thrombectomy of left upper extremity arteriovenous graft 6. Balloon maceration of left upper extremity arteriovenous graft 7. Completion fistulogram of left upper extremity graft COMPARISON:  None. MEDICATIONS: 4000 units heparin, intravenous CONTRAST:  18mL OMNIPAQUE IOHEXOL 300 MG/ML  SOLN ANESTHESIA/SEDATION: Moderate (conscious) sedation was employed during this procedure. A total of Versed 0.5 mg and Fentanyl 37.5 mcg was administered intravenously. Moderate Sedation Time: 44 minutes. The patient's level of consciousness and vital signs were monitored continuously by radiology nursing throughout the procedure under my direct supervision. FLUOROSCOPY TIME:  6 minutes, 24 seconds, 3 mGy COMPLICATIONS: None immediate. PROCEDURE: Informed written consent was obtained from the patient after a discussion of the risk, benefits and alternatives to treatment. Questions regarding the procedure were encouraged and answered. A timeout was performed prior to the initiation of the procedure. The left arm dialysis graft was prepped with Chlorhexidine in a sterile fashion, and a sterile drape was applied covering the operative field. Preprocedure ultrasound demonstrated complete thrombosis of the visualized portion indwelling brachial axillary graft. Using ultrasound guidance, 21 grade should micropuncture needle was inserted into the graft in antegrade fashion near the arterial anastomosis. A Wholey wire was then directed to the central veins over which the micropuncture sheath was exchanged for a 6 [place] sheath. A 5 [place] Kumpe the catheter was then directed to the level of the innominate vein. Hand injection of contrast was  performed as the catheter was withdrawn to delineate the central aspect of the thrombosed graft which was at the level of the axillary vein. Next, pharmacomechanical thrombolysis was performed with 8 mg tPA admixed into 250 mg of heparinized saline. A total of 100 mg of the solution was administered from the antegrade sheath to the central portion of the occluded axillary vein. The tPA was allowed to dwell. Next, retrograde access was obtained with ultrasound guidance and a 21 gauge needle. A Glidewire was directed into the brachial artery, over which a 6 French sheath was placed. Fogarty balloon thrombectomy was performed with 3 sweeping passes through the arterial anastomosis. Next, rheolytic thrombectomy was performed through the venous outflow from the antegrade sheath. This was tolerated well without physiologic reactive changes. This was followed by balloon maceration with a 7 mm x 4 cm mustang balloon from the peripheral aspect of the antegrade access to the axillary vein. Fistulagram was then performed from the antegrade sheath which demonstrated brisk antegrade flow without flow-limiting stenosis in  the outflow portion of the graft. Retrograde access was then obtained into the brachial vein just proximal to the anastomosis with a Glidewire and Kumpe the catheter. Hand injection through the Kumpe the catheter demonstrated patent graft with brisk outflow. The sheaths were removed and hemostasis obtained with application of a 2-0 Prolene pursestring suture which will be removed at the patient's next dialysis session. A dressing was placed. The patient tolerated the procedure well without immediate postprocedural complication. FINDINGS: Occluded indwelling brachial axillary graft. Successful declot as described above. Widely patent graft after declot. IMPRESSION: 1. Occluded indwelling left brachial artery to axillary vein dialysis graft. 2. Successful pharmacomechanical thrombolysis, arterial balloon  thrombectomy, rheolytic thrombectomy, balloon maceration for recanalization of the left upper extremity graft. ACCESS: This access remains amenable to future percutaneous interventions as clinically indicated. Ruthann Cancer, MD [facility] Radiology Electronically Signed   By: Ruthann Cancer MD   On: 12/21/2019 18:11   IR THROMBECTOMY AV FISTULA W/THROMBOLYSIS/PTA INC/SHUNT/IMG LEFT  Result Date: 12/21/2019 INDICATION: 74 year old female with history of end-stage renal disease on hemodialysis with clinical suspicion for thrombosis of left upper extremity arteriovenous graft. EXAM: 1. Ultrasound-guided antegrade venous access of left upper extremity arteriovenous graft 2. Pharmacomechanical thrombolysis of left upper extremity arteriovenous graft 3. Ultrasound-guided retrograde venous access of left upper extremity arteriovenous graft 4. Arteriovenous anastomosis balloon thrombectomy 5. Rheolytic thrombectomy of left upper extremity arteriovenous graft 6. Balloon maceration of left upper extremity arteriovenous graft 7. Completion fistulogram of left upper extremity graft COMPARISON:  None. MEDICATIONS: 4000 units heparin, intravenous CONTRAST:  70mL OMNIPAQUE IOHEXOL 300 MG/ML  SOLN ANESTHESIA/SEDATION: Moderate (conscious) sedation was employed during this procedure. A total of Versed 0.5 mg and Fentanyl 37.5 mcg was administered intravenously. Moderate Sedation Time: 44 minutes. The patient's level of consciousness and vital signs were monitored continuously by radiology nursing throughout the procedure under my direct supervision. FLUOROSCOPY TIME:  6 minutes, 24 seconds, 3 mGy COMPLICATIONS: None immediate. PROCEDURE: Informed written consent was obtained from the patient after a discussion of the risk, benefits and alternatives to treatment. Questions regarding the procedure were encouraged and answered. A timeout was performed prior to the initiation of the  procedure. The left arm dialysis graft was prepped with Chlorhexidine in a sterile fashion, and a sterile drape was applied covering the operative field. Preprocedure ultrasound demonstrated complete thrombosis of the visualized portion indwelling brachial axillary graft. Using ultrasound guidance, 21 grade should micropuncture needle was inserted into the graft in antegrade fashion near the arterial anastomosis. A Wholey wire was then directed to the central veins over which the micropuncture sheath was exchanged for a 6 [place] sheath. A 5 [place] Kumpe the catheter was then directed to the level of the innominate vein. Hand injection of contrast was performed as the catheter was withdrawn to delineate the central aspect of the thrombosed graft which was at the level of the axillary vein. Next, pharmacomechanical thrombolysis was performed with 8 mg tPA admixed into 250 mg of heparinized saline. A total of 100 mg of the solution was administered from the antegrade sheath to the central portion of the occluded axillary vein. The tPA was allowed to dwell. Next, retrograde access was obtained with ultrasound guidance and a 21 gauge needle. A Glidewire was directed into the brachial artery, over which a 6 French sheath was placed. Fogarty balloon thrombectomy was performed with 3 sweeping passes through the arterial anastomosis. Next, rheolytic thrombectomy was performed through the venous outflow from the antegrade  sheath. This was tolerated well without physiologic reactive changes. This was followed by balloon maceration with a 7 mm x 4 cm mustang balloon from the peripheral aspect of the antegrade access to the axillary vein. Fistulagram was then performed from the antegrade sheath which demonstrated brisk antegrade flow without flow-limiting stenosis in the outflow portion of the graft. Retrograde access was then obtained into the brachial vein just proximal to the anastomosis with a Glidewire and Kumpe the  catheter. Hand injection through the Kumpe the catheter demonstrated patent graft with brisk outflow. The sheaths were removed and hemostasis obtained with application of a 2-0 Prolene pursestring suture which will be removed at the patient's next dialysis session. A dressing was placed. The patient tolerated the procedure well without immediate postprocedural complication. FINDINGS: Occluded indwelling brachial axillary graft. Successful declot as described above. Widely patent graft after declot. IMPRESSION: 1. Occluded indwelling left brachial artery to axillary vein dialysis graft. 2. Successful pharmacomechanical thrombolysis, arterial balloon thrombectomy, rheolytic thrombectomy, balloon maceration for recanalization of the left upper extremity graft. ACCESS: This access remains amenable to future percutaneous interventions as clinically indicated. Ruthann Cancer, MD [facility] Radiology Electronically Signed   By: Ruthann Cancer MD   On: 12/21/2019 18:11   Medications:  . acetaminophen  650 mg Rectal Q8H   Or  . acetaminophen  650 mg Oral Q8H  . alteplase  8 mg Intracatheter Once  . amLODipine  10 mg Oral Daily  . calcitRIOL  0.25 mcg Oral Once per day on Mon Wed Fri  . Chlorhexidine Gluconate Cloth  6 each Topical Q0600  . darbepoetin (ARANESP) injection - DIALYSIS  60 mcg Intravenous Q Wed-HD  . escitalopram  10 mg Oral Daily  . influenza vaccine adjuvanted  0.5 mL Intramuscular Tomorrow-1000  . lidocaine  1 patch Transdermal Q24H  . polyethylene glycol  17 g Oral BID  . ramelteon  8 mg Oral QHS  . senna-docusate  2 tablet Oral BID

## 2019-12-23 NOTE — Progress Notes (Signed)
Physical Therapy Treatment Patient Details Name: Bianca Myers MRN: [id number] DOB: May 05, 1945 Today's Date: 12/23/2019    History of Present Illness Pt is a 74 y.o. female admitted 12/17/19 after fall at home the day prior. Imaging revealed R 7-11 rib fxs, chronic C7 compression fx, subacute to chronic R pubic fx deformities, nonacute R hemisacrum fx deformities. Pt with clogged HD catheter, s/p temporary cath placement 10/18. PMH includes HTN, DM2, ESRD (on HD), gout. Of note, admission 06/2019 due to fall resulting in L hip fx s/p anterior hip hemiarthroplasty, R pubic rami fx.   PT Comments    Pt slowly progressing with mobility. Limited by significant R-side rib pain as well as fatigue post-HD with noted hypotension during treatment. Today's session focused on bed mobility and transfer training with RW; pt requiring up to minA and frequent cues for abdominal precautions for comfort with mobility due to rib fx pain. Pt reports still unsure on home vs. SNF. Pt limited by generalized weakness, decreased activity tolerance, impaired balance, poor awareness and significant pain; at high risk for falls. Continue to recommend SNF-level therapies to maximize functional mobility and independence.    Follow Up Recommendations  SNF;Supervision/Assistance - 24 hour     Equipment Recommendations   (TBD)    Recommendations for Other Services       Precautions / Restrictions Precautions Precautions: Fall;Other (comment) Precaution Comments: Abdominal for comfort (R-side rib fxs) Restrictions Weight Bearing Restrictions: No    Mobility  Bed Mobility Overal bed mobility: Needs Assistance Bed Mobility: Rolling;Sidelying to Sit;Sit to Sidelying Rolling: Supervision Sidelying to sit: Min assist   Sit to supine: Min assist   General bed mobility comments: Good rolling technique with use of bed rail, minA for trunk elevation; minA for LE management return to supine; significant pain with  this  Transfers Overall transfer level: Needs assistance Equipment used: Rolling walker (2 wheeled) Transfers: Sit to/from Stand Sit to Stand: Min guard         General transfer comment: Limited by pain but agreeable to stand for pericare, able to stand from EOB to RW with min guard for balance  Ambulation/Gait             General Gait Details: Pt declined secondary to pain and fatigue   Stairs             Wheelchair Mobility    Modified Rankin (Stroke Patients Only)       Balance Overall balance assessment: Needs assistance Sitting-balance support: Bilateral upper extremity supported;Feet supported Sitting balance-Leahy Scale: Fair       Standing balance-Leahy Scale: Fair Standing balance comment: Can static stand without UE support, performing posterior pericare with single UE support and min guard                            Cognition Arousal/Alertness: Awake/alert Behavior During Therapy: WFL for tasks assessed/performed Overall Cognitive Status: No family/caregiver present to determine baseline cognitive functioning Area of Impairment: Attention;Memory;Following commands;Safety/judgement;Awareness;Problem solving                   Current Attention Level: Selective Memory: Decreased short-term memory Following Commands: Follows one step commands with increased time Safety/Judgement: Decreased awareness of safety;Decreased awareness of deficits Awareness: Emergent Problem Solving: Decreased initiation;Difficulty sequencing;Requires verbal cues;Requires tactile cues General Comments: inconsistent pain response for voluntary/involuntary movement; appears self-limiting from pain      Exercises      General Comments  Pertinent Vitals/Pain Pain Assessment: Faces Faces Pain Scale: Hurts whole lot Pain Location: R-side anterior/posterior ribs Pain Descriptors / Indicators: Discomfort;Grimacing;Guarding;Moaning Pain  Intervention(s): Monitored during session;Patient requesting pain meds-RN notified    Home Living                      Prior Function            PT Goals (current goals can now be found in the care plan section) Progress towards PT goals: Progressing toward goals (slowly)    Frequency    Min 3X/week      PT Plan Current plan remains appropriate    Co-evaluation              AM-PAC PT "6 Clicks" Mobility   Outcome Measure  Help needed turning from your back to your side while in a flat bed without using bedrails?: A Little Help needed moving from lying on your back to sitting on the side of a flat bed without using bedrails?: A Little Help needed moving to and from a bed to a chair (including a wheelchair)?: A Little Help needed standing up from a chair using your arms (e.g., wheelchair or bedside chair)?: A Little Help needed to walk in hospital room?: A Lot Help needed climbing 3-5 steps with a railing? : A Lot 6 Click Score: 16    End of Session   Activity Tolerance: Patient limited by pain Patient left: in bed;with call bell/phone within reach;with bed alarm set Nurse Communication: Mobility status;Patient requests pain meds PT Visit Diagnosis: Muscle weakness (generalized) (M62.81);History of falling (Z91.81);Pain Pain - Right/Left: Right     Time: 9323-5573 PT Time Calculation (min) (ACUTE ONLY): 19 min  Charges:  $Therapeutic Activity: 8-22 mins                    Mabeline Caras, PT, DPT Acute Rehabilitation Services  Pager [phone] Office [place] 12/23/2019, 4:48 PM

## 2019-12-23 NOTE — TOC Progression Note (Signed)
Transition of Care [facility]) - Progression Note    Patient Details  Name: Bianca Myers MRN: [id number] Date of Birth: Mar 18, 1945  Transition of Care [facility]) CM/SW [place], [place] Phone Number: 12/23/2019, 4:58 PM  Clinical Narrative:    CSW gave bed offers. Pt unsatisfied with choices. Pt states she will go home if we are unable to find facility close to [employer]   Pt lives with son, but he works. Pt does not have 24 hour supervision. CSW explained that is not a safe discharge plan. Pt stated she can do what she wants and can manage on her own. Pt is receptive to [facility] services. PT reports her son takes her to HD.   Expected Discharge Plan: [place] (vs [facility]) Barriers to Discharge: Continued Medical Work up  Expected Discharge Plan and Services Expected Discharge Plan: [place] (vs [facility])         Expected Discharge Date: 12/19/19                                     Social Determinants of Health (SDOH) Interventions    Readmission Risk Interventions No flowsheet data found.  Emeterio Reeve, Latanya Presser, [place] Social Worker [phone]

## 2019-12-23 NOTE — Progress Notes (Signed)
PT Cancellation Note  Patient Details Name: Bianca Myers MRN: [id number] DOB: 05-Jun-1945   Cancelled Treatment:    Reason Eval/Treat Not Completed: (P) Patient at procedure or test/unavailable Pt at HD dept, will continue efforts per PT POC in afternoon once pt available as schedule permits.   Venkat Ankney M [place] Deyoung 12/23/2019, 10:00 AM

## 2019-12-23 NOTE — Progress Notes (Signed)
   12/23/19 1146  Vitals  Temp 98.1 F (36.7 C)  Temp Source Oral  BP 123/84  MAP (mmHg) 97  BP Location Right Arm  BP Method Automatic  Patient Position (if appropriate) Lying  Pulse Rate 89  ECG Heart Rate 87  Resp 14  Oxygen Therapy  SpO2 97 %  End Tidal CO2 (EtCO2) 30  Dialysis Weight  Weight 75 kg  Type of Weight Post-Dialysis  Post-Hemodialysis Assessment  Rinseback Volume (mL) 250 mL  KECN 278 V  Dialyzer Clearance Lightly streaked  Duration of HD Treatment -hour(s) 3.5 hour(s)  Hemodialysis Intake (mL) 700 mL  UF Total -Machine (mL) 1290 mL  Net UF (mL) 590 mL  Tolerated HD Treatment No (Comment) (see progress noted)  Post-Hemodialysis Comments tx complete-pt stable  AVG/AVF Arterial Site Held (minutes) 5 minutes  AVG/AVF Venous Site Held (minutes) 5 minutes  HD tx complete-pt stable. Pt noted with intermittent hypotension, SBP sustaining in 80s mid tx, pt noted reports of "not feeling well", upon assessment pt noted to be clammy. NS 23ml bolused with reports of feeling better and BP normotensive. Martha-PA on unit and directs not to pull any more fluid. Pt dialyzed for the remainder of her tx.

## 2019-12-24 LAB — CBC WITH DIFFERENTIAL/PLATELET
Abs Immature Granulocytes: 0.09 10*3/uL — ABNORMAL HIGH (ref 0.00–0.07)
Basophils Absolute: 0 10*3/uL (ref 0.0–0.1)
Basophils Relative: 0 %
Eosinophils Absolute: 0.5 10*3/uL (ref 0.0–0.5)
Eosinophils Relative: 6 %
HCT: 29.1 % — ABNORMAL LOW (ref 36.0–46.0)
Hemoglobin: 9.2 g/dL — ABNORMAL LOW (ref 12.0–15.0)
Immature Granulocytes: 1 %
Lymphocytes Relative: 19 %
Lymphs Abs: 1.7 10*3/uL (ref 0.7–4.0)
MCH: 27.7 pg (ref 26.0–34.0)
MCHC: 31.6 g/dL (ref 30.0–36.0)
MCV: 87.7 fL (ref 80.0–100.0)
Monocytes Absolute: 1 10*3/uL (ref 0.1–1.0)
Monocytes Relative: 11 %
Neutro Abs: 5.5 10*3/uL (ref 1.7–7.7)
Neutrophils Relative %: 63 %
Platelets: 183 10*3/uL (ref 150–400)
RBC: 3.32 MIL/uL — ABNORMAL LOW (ref 3.87–5.11)
RDW: 18.6 % — ABNORMAL HIGH (ref 11.5–15.5)
WBC: 8.9 10*3/uL (ref 4.0–10.5)
nRBC: 0.3 % — ABNORMAL HIGH (ref 0.0–0.2)

## 2019-12-24 LAB — BASIC METABOLIC PANEL
Anion gap: 12 (ref 5–15)
BUN: 22 mg/dL (ref 8–23)
CO2: 28 mmol/L (ref 22–32)
Calcium: 9.2 mg/dL (ref 8.9–10.3)
Chloride: 98 mmol/L (ref 98–111)
Creatinine, Ser: 4.7 mg/dL — ABNORMAL HIGH (ref 0.44–1.00)
GFR, Estimated: 9 mL/min — ABNORMAL LOW (ref >60)
Glucose, Bld: 121 mg/dL — ABNORMAL HIGH (ref 70–99)
Potassium: 3.9 mmol/L (ref 3.5–5.1)
Sodium: 138 mmol/L (ref 135–145)

## 2019-12-24 LAB — PHOSPHORUS: Phosphorus: 3.1 mg/dL (ref 2.5–4.6)

## 2019-12-24 MED ORDER — ATORVASTATIN CALCIUM 10 MG PO TABS
10.0000 mg | ORAL_TABLET | Freq: Every evening | ORAL | Status: DC
  Administered 2019-12-24 – 2019-12-25 (×2): 10 mg via ORAL
  Filled 2019-12-24 (×2): qty 1

## 2019-12-24 MED ORDER — AMLODIPINE BESYLATE 5 MG PO TABS
5.0000 mg | ORAL_TABLET | Freq: Every day | ORAL | Status: DC
  Administered 2019-12-24 – 2019-12-26 (×3): 5 mg via ORAL
  Filled 2019-12-24 (×3): qty 1

## 2019-12-24 NOTE — Progress Notes (Signed)
[facility] Progress Note   Dialysis Orders: MWF [place]  3h41min400/800 70.5kg 2 /2.25 bathLUE AVG Hepnone - Mircera 121mcg IV q 2 weeks - Calcitriol 0.28mcg PO q HD  Assessment/Plan: 1. C7 + R 7-11th rib fractures: Pain control per primary. Cervical Fxdeemedchronic in nature.  2. Fall/ possible syncopal episode:Was after dialysis, possibly mild volume depletion. EDW recently raised 0.5 . May need more. Amlodipine decreased to 5 3. Enlarged thoracic aorta: Will f/u with TCTS as outpatient. 4. Hyperkalemia: On admit, resolved with HD. k 3.6 today - on 4 K bath 5. HD access:Clotted LUE AVG.Temp HD cath 10/18 - removed before use of declotted  AVGG; now s/p declot 10/20.AppreciateIRassistance.using [place] today - needs AF at outpatient unit after d/c  6. ESRD:MWF - no issues Friday  7. Hypertension/volume:BP controlled - minimal edema. UF as tolerated.Wt's are questionable.Bed weight pre HD is 75.1 no volume on admission on CXR - might be able to go up more - keep SBP > 95 Had BP drop on HD Friday - kept even the rest of treatment with net UF 590 and post wt 75  Significantly above prior edw - BP higher than previously this am - watch - needs to have standing wts and orthostatics pre HD Monday if still here Amlodipine decreased to 5 starting today  8. Anemia:Hgb9 > 8.5 > 9.2   - Aranesp 12mcg IV given 51/02. 9. Metabolic bone disease:Caok - not on binders - add on P today, resumed VDRA. 10. T2DM: Per primary. 11. Disp: possible SNF vs HH - CSW working with patient Lives with son but he is not there 24/7 due to working.  Myriam Jacobson, PA-C [facility] Beeper [phone] 12/24/2019,9:22 AM  LOS: 6 days   Subjective:   Wants to know what the plan is. C/o pain in right hip and right side/chest but does not impair her ability to get to [facility].  Passing gas but no recent BM - getting laxative this am.   Objective Vitals:    12/23/19 1529 12/23/19 2040 12/24/19 0300 12/24/19 0721  BP: 106/64 126/65 117/69 (!) 155/66  Pulse: 73 76  73  Resp: 16 17 18 17   Temp: 99.1 F (37.3 C) 98.2 F (36.8 C) 98 F (36.7 C) 99 F (37.2 C)  TempSrc: Oral Oral Oral Oral  SpO2: 99% 97% 99% 97%  Weight:      Height:       Physical Exam goal 2 L General: NAD  Heart: RRR Lungs: no rales Abdomen: soft NT Extremities: no edema Dialysis Access:  Temp cath out left AVGG + bruit   Additional Objective Labs: Basic Metabolic Panel: Recent Labs  Lab 12/22/19 0437 12/23/19 0307 12/24/19 0749  NA 135 131* 138  K 3.9 3.6 3.9  CL 97* 94* 98  CO2 26 25 28   GLUCOSE 129* 122* 121*  BUN 29* 44* 22  CREATININE 4.95* 6.68* 4.70*  CALCIUM 8.8* 8.6* 9.2   Liver Function Tests: Recent Labs  Lab 12/17/19 1954  AST 17  ALT 16  ALKPHOS 131*  BILITOT 0.5  PROT 7.2  ALBUMIN 3.6   No results for input(s): LIPASE, AMYLASE in the last 168 hours. CBC: Recent Labs  Lab 12/19/19 0615 12/19/19 0615 12/20/19 1700 12/20/19 1700 12/21/19 0233 12/23/19 0307 12/24/19 0749  WBC 9.7   < > 10.1   < > 9.8 10.1 8.9  NEUTROABS  --   --  7.2   < > 7.1 6.7 5.5  HGB  10.2*   < > 9.2*   < > 9.0* 8.5* 9.2*  HCT 32.4*   < > 29.2*   < > 27.9* 26.6* 29.1*  MCV 84.8  --  84.6  --  85.6 86.6 87.7  PLT 241   < > 197   < > 194 165 183   < > = values in this interval not displayed.   Blood Culture    Component Value Date/Time   SDES BLOOD RIGHT ANTECUBITAL 06/07/2019 2304   SPECREQUEST  06/07/2019 2304    BOTTLES DRAWN AEROBIC AND ANAEROBIC Blood Culture adequate volume   CULT  06/07/2019 2304    NO GROWTH 5 DAYS Performed at [facility], [place] [address] [place], [place] [address]    REPTSTATUS 06/13/2019 FINAL 06/07/2019 2304    Cardiac Enzymes: No results for input(s): CKTOTAL, CKMB, CKMBINDEX, TROPONINI in the last 168 hours. CBG: No results for input(s): GLUCAP in the last 168 hours. Iron Studies: No results for  input(s): IRON, TIBC, TRANSFERRIN, FERRITIN in the last 72 hours. Lab Results  Component Value Date   INR 1.3 (H) 06/08/2019   INR 1.2 08/24/2018   INR 1.1 08/21/2018   Studies/Results: No results found. Medications:  . acetaminophen  650 mg Oral Q8H  . alteplase  8 mg Intracatheter Once  . amLODipine  5 mg Oral Daily  . atorvastatin  10 mg Oral QPM  . calcitRIOL  0.25 mcg Oral Once per day on Mon Wed Fri  . Chlorhexidine Gluconate Cloth  6 each Topical Q0600  . darbepoetin (ARANESP) injection - DIALYSIS  60 mcg Intravenous Q Wed-HD  . escitalopram  10 mg Oral Daily  . influenza vaccine adjuvanted  0.5 mL Intramuscular Tomorrow-1000  . lidocaine  1 patch Transdermal Q24H  . multivitamin  1 tablet Oral QHS  . polyethylene glycol  17 g Oral BID  . ramelteon  8 mg Oral QHS  . senna-docusate  2 tablet Oral BID

## 2019-12-24 NOTE — Progress Notes (Signed)
   Subjective: Patient reports talking with social work and no good options for SNF as of yet.  She says her son is home most of the time, and does not work a daily job.  She is still requiring Dilaudid every 4 hours for pain.  No other acute concerns this morning  Objective:  Vital signs in last 24 hours: Vitals:   12/23/19 1235 12/23/19 1529 12/23/19 2040 12/24/19 0300  BP: 107/67 106/64 126/65 117/69  Pulse: 80 73 76   Resp: 17 16 17 18   Temp: 99.3 F (37.4 C) 99.1 F (37.3 C) 98.2 F (36.8 C) 98 F (36.7 C)  TempSrc: Oral Oral Oral Oral  SpO2: 100% 99% 97% 99%  Weight:      Height:       General: Elderly woman laying in bed , no acute distress Cardiovascular: Regular rate and rhythm no murmurs rubs or gallops Pulmonary: Pain with deep breaths, no wheezes or rhonchi Skin: warm ,dry  Assessment/Plan:  Principal Problem:   Fall Active Problems:   End-stage renal disease on hemodialysis (HCC)   Diabetes mellitus ([place])   Multiple rib fractures   Hyperkalemia   Thoracic ascending aortic aneurysm ([place])  Fall Fractured ribs and Cervical vertebra Patient had a mechanical fall. She is in significant pain and was found to have displaced fractures of the posterior right 7th-11th ribs and a trace pleural effusion. Patient likely can be discharged tomorrow if she is deciding to go home.  Plan to touch base with son today and clarify discharge plan. -Schedule Tylenol 650 q8hrs - as needed Dilaudid 1 mg every 4 hours  - Lidocaine patch - Robaxin 500mg  q 6 PRN - Incentive spirometry - Pulm toilet -PT/OT  HTN HFpEF Hx of HTN and grade II Diastolic HF. BP this AM 117/69 with HR of 80. Home medications: Norvasc 10mg , Labetalol 200 mg BID - Hypotension noted by PT yesterday. -Decrease Norvasc to 5mg  daily - hold Labetalol  - hold lasix  Mood disorder Patient home medication includes Lexapro 10mg . -Continue homeLexapro 10mg   HLD - Continue home atorvastatin    ESRD (MWF) Patient is ESRD with HD on MWF.Patient in HD today. Temp cath was successfully removed yesterday. Hgb 10.1. -Nephro has been consulted appreciate recc's - trend CBC  Constipation Patient reporting constipation. - Miralax - senna-docusate  Prior to Admission Living Arrangement:Home Anticipated Discharge Location:SNF Barriers to Discharge:Treatament Dispo: Anticipated discharge in approximately1-2day(s).  Madalyn Rob, MD 12/24/2019, 6:34 AM Pager: [phone] After 5pm on weekdays and 1pm on weekends: On Call pager [phone]

## 2019-12-24 NOTE — Progress Notes (Signed)
I spoke to patient's son today and he is available 99% of the day.  He has been living with his mother and helps with ADLs.  We discussed recommendations to go to skilled nursing facility, but if patient declines then the next best step would be home health services.  He believes his mother could be successful at home with home health.  Given this is the patient and son's wishes it is likely we could discharge home tomorrow pending continued improvement.

## 2019-12-25 DIAGNOSIS — Z940 Kidney transplant status: Secondary | ICD-10-CM

## 2019-12-25 DIAGNOSIS — E875 Hyperkalemia: Secondary | ICD-10-CM

## 2019-12-25 LAB — CBC WITH DIFFERENTIAL/PLATELET
Abs Immature Granulocytes: 0.13 10*3/uL — ABNORMAL HIGH (ref 0.00–0.07)
Basophils Absolute: 0 10*3/uL (ref 0.0–0.1)
Basophils Relative: 0 %
Eosinophils Absolute: 0.5 10*3/uL (ref 0.0–0.5)
Eosinophils Relative: 7 %
HCT: 26.3 % — ABNORMAL LOW (ref 36.0–46.0)
Hemoglobin: 8.3 g/dL — ABNORMAL LOW (ref 12.0–15.0)
Immature Granulocytes: 2 %
Lymphocytes Relative: 26 %
Lymphs Abs: 2 10*3/uL (ref 0.7–4.0)
MCH: 27.4 pg (ref 26.0–34.0)
MCHC: 31.6 g/dL (ref 30.0–36.0)
MCV: 86.8 fL (ref 80.0–100.0)
Monocytes Absolute: 0.9 10*3/uL (ref 0.1–1.0)
Monocytes Relative: 12 %
Neutro Abs: 4 10*3/uL (ref 1.7–7.7)
Neutrophils Relative %: 53 %
Platelets: 183 10*3/uL (ref 150–400)
RBC: 3.03 MIL/uL — ABNORMAL LOW (ref 3.87–5.11)
RDW: 18.4 % — ABNORMAL HIGH (ref 11.5–15.5)
WBC: 7.5 10*3/uL (ref 4.0–10.5)
nRBC: 0.4 % — ABNORMAL HIGH (ref 0.0–0.2)

## 2019-12-25 LAB — RENAL FUNCTION PANEL
Albumin: 2.6 g/dL — ABNORMAL LOW (ref 3.5–5.0)
Anion gap: 16 — ABNORMAL HIGH (ref 5–15)
BUN: 37 mg/dL — ABNORMAL HIGH (ref 8–23)
CO2: 23 mmol/L (ref 22–32)
Calcium: 9 mg/dL (ref 8.9–10.3)
Chloride: 95 mmol/L — ABNORMAL LOW (ref 98–111)
Creatinine, Ser: 6.9 mg/dL — ABNORMAL HIGH (ref 0.44–1.00)
GFR, Estimated: 6 mL/min — ABNORMAL LOW (ref >60)
Glucose, Bld: 192 mg/dL — ABNORMAL HIGH (ref 70–99)
Phosphorus: 4.3 mg/dL (ref 2.5–4.6)
Potassium: 4.2 mmol/L (ref 3.5–5.1)
Sodium: 134 mmol/L — ABNORMAL LOW (ref 135–145)

## 2019-12-25 LAB — BASIC METABOLIC PANEL
Anion gap: 12 (ref 5–15)
BUN: 34 mg/dL — ABNORMAL HIGH (ref 8–23)
CO2: 26 mmol/L (ref 22–32)
Calcium: 8.9 mg/dL (ref 8.9–10.3)
Chloride: 98 mmol/L (ref 98–111)
Creatinine, Ser: 6.29 mg/dL — ABNORMAL HIGH (ref 0.44–1.00)
GFR, Estimated: 7 mL/min — ABNORMAL LOW (ref >60)
Glucose, Bld: 95 mg/dL (ref 70–99)
Potassium: 4.1 mmol/L (ref 3.5–5.1)
Sodium: 136 mmol/L (ref 135–145)

## 2019-12-25 LAB — CBC
HCT: 29.1 % — ABNORMAL LOW (ref 36.0–46.0)
Hemoglobin: 8.9 g/dL — ABNORMAL LOW (ref 12.0–15.0)
MCH: 26.6 pg (ref 26.0–34.0)
MCHC: 30.6 g/dL (ref 30.0–36.0)
MCV: 87.1 fL (ref 80.0–100.0)
Platelets: 174 10*3/uL (ref 150–400)
RBC: 3.34 MIL/uL — ABNORMAL LOW (ref 3.87–5.11)
RDW: 18.4 % — ABNORMAL HIGH (ref 11.5–15.5)
WBC: 8.6 10*3/uL (ref 4.0–10.5)
nRBC: 0.2 % (ref 0.0–0.2)

## 2019-12-25 MED ORDER — HEPARIN SODIUM (PORCINE) 1000 UNIT/ML DIALYSIS: 1000.0000 [IU] | INTRAMUSCULAR | Status: DC | PRN

## 2019-12-25 MED ORDER — SODIUM CHLORIDE 0.9 % IV SOLN: 100.0000 mL | INTRAVENOUS | Status: DC | PRN

## 2019-12-25 MED ORDER — ACETAMINOPHEN 500 MG PO TABS
1000.0000 mg | ORAL_TABLET | Freq: Four times a day (QID) | ORAL | Status: DC
  Administered 2019-12-25 – 2019-12-26 (×5): 1000 mg via ORAL
  Filled 2019-12-25 (×4): qty 2

## 2019-12-25 MED ORDER — PENTAFLUOROPROP-TETRAFLUOROETH EX AERO: 1.0000 "application " | INHALATION_SPRAY | CUTANEOUS | Status: DC | PRN

## 2019-12-25 MED ORDER — ALTEPLASE 2 MG IJ SOLR: 2.0000 mg | Freq: Once | INTRAMUSCULAR | Status: DC | PRN

## 2019-12-25 MED ORDER — IBUPROFEN 100 MG PO CHEW: 100.0000 mg | CHEWABLE_TABLET | Freq: Four times a day (QID) | ORAL | Status: DC

## 2019-12-25 MED ORDER — NEPRO/CARBSTEADY PO LIQD
237.0000 mL | Freq: Two times a day (BID) | ORAL | Status: DC
  Administered 2019-12-25 – 2019-12-26 (×3): 237 mL via ORAL

## 2019-12-25 MED ORDER — LIDOCAINE HCL (PF) 1 % IJ SOLN: 5.0000 mL | INTRAMUSCULAR | Status: DC | PRN

## 2019-12-25 MED ORDER — IBUPROFEN 200 MG PO TABS
600.0000 mg | ORAL_TABLET | Freq: Four times a day (QID) | ORAL | Status: DC
  Administered 2019-12-25 – 2019-12-26 (×2): 600 mg via ORAL
  Filled 2019-12-25 (×5): qty 3

## 2019-12-25 MED ORDER — LIDOCAINE-PRILOCAINE 2.5-2.5 % EX CREA: 1.0000 "application " | TOPICAL_CREAM | CUTANEOUS | Status: DC | PRN

## 2019-12-25 MED ORDER — HYDROMORPHONE HCL 2 MG PO TABS
1.0000 mg | ORAL_TABLET | Freq: Once | ORAL | Status: AC
  Administered 2019-12-25: 1 mg via ORAL
  Filled 2019-12-25: qty 1

## 2019-12-25 MED ORDER — CHLORHEXIDINE GLUCONATE CLOTH 2 % EX PADS
6.0000 | MEDICATED_PAD | Freq: Every day | CUTANEOUS | Status: DC
  Administered 2019-12-25 – 2019-12-26 (×2): 6 via TOPICAL

## 2019-12-25 NOTE — Progress Notes (Signed)
   Subjective: She is complaining of pain in ribs going up into her breast. States that it is like sticks of pain. She was in pain when pain meds were given, but they did not help her this time.  She got up a little bit yesterday. Will start ibuprofen today to try to help her come off of opioid therapy, she is agreeable to it. Discussed plan, no questions at this time.   Objective:  Vital signs in last 24 hours: Vitals:   12/24/19 1452 12/24/19 1923 12/25/19 0325 12/25/19 0900  BP: (!) 155/80 107/62 (!) 141/71 136/67  Pulse: 72 69 69 70  Resp: 17 20 16 17   Temp: 98.6 F (37 C) 98 F (36.7 C) 98.3 F (36.8 C) 97.6 F (36.4 C)  TempSrc: Oral Oral Oral Oral  SpO2: 97% 99% 95% 95%  Weight:      Height:       General: Elderly woman laying in bed , no acute distress Cardiovascular: Regular rate and rhythm no murmurs rubs or gallops Pulmonary: Pain with deep breaths, no wheezes or rhonchi Skin: warm ,dry  Assessment/Plan:  Principal Problem:   Fall Active Problems:   End-stage renal disease on hemodialysis (HCC)   Diabetes mellitus ([place])   Multiple rib fractures   Hyperkalemia   Thoracic ascending aortic aneurysm ([place])  Fall Fractured ribs and Cervical vertebra Patient had a mechanical fall and found to have rib fractures. She continues to have pain from displaced fractures of the posterior right 7th-11th ribs.  We will schedule ibuprofen and increase Tylenol dosage.  Will give one-time extra dose of Dilaudid.  Patient would like to be discharged home instead of going to SNF. Son will be able to provide care at home. I will reach out to case manager to see if home health services are available. -Schedule Tylenol 1000 every 6 hours -Scheduled ibuprofen 600 mg every 6 hours - as needed Dilaudid 1 mg every 4 hours  - Lidocaine patch - Robaxin 500mg  q 6 PRN - Incentive spirometry - Pulm toilet -PT/OT  HTN HFpEF Hx of HTN and grade II Diastolic HF. Home medications:  Norvasc 10mg , Labetalol 200 mg BID.  Given falls and hypotension noted during admission we have decreased home medications -Continue Norvasc to 5mg  daily - hold Labetalol  - hold lasix  Mood disorder Patient home medication includes Lexapro 10mg . -Continue homeLexapro 10mg   HLD - Continue home atorvastatin   ESRD (MWF) Patient is ESRD with HD on MWF. Last HD session Friday  -Per nephrology  Constipation Patient reporting constipation. - Miralax - senna-docusate  Prior to Admission Living Arrangement:Home Anticipated Discharge Location:Home with home health services Barriers to Discharge:Pain control and arrangement of home health services Dispo: Anticipated discharge in approximately1-2day(s).  Madalyn Rob, MD 12/25/2019, 11:40 AM Pager: [phone] After 5pm on weekdays and 1pm on weekends: On Call pager [phone]

## 2019-12-25 NOTE — Progress Notes (Signed)
Administered 1mg  of dilaudid along with robaxin-at 0415 followed by tylenol- is currently requesting more pain meds at this time. Notified on call for IMTS- whom will look over and make any adjustments to her pain management

## 2019-12-25 NOTE — Progress Notes (Signed)
Bianca Myers Progress Note   Dialysis Orders: MWF [place]  3h67min400/800 70.5kg 2 /2.25 bathLUE AVG Hepnone - Mircera 175mcg IV q 2 weeks - Calcitriol 0.29mcg PO q HD  Assessment/Plan: 1. C7 + R 7-11th rib fractures: Pain control per primary. Still requiring dilaudid. Cervical Fxdeemedchronic in nature.  2. Fall/ possible syncopal episode:Was after dialysis, possibly mild volume depletion. EDW recently raised 0.5 . May need more. Amlodipine decreased to 5 3. Enlarged thoracic aorta: Will f/u with TCTS as outpatient. 4. Hyperkalemia: On admit, resolved with HD. k 3.6 Saturday - on 4 K bath K 4.1 10/24 5. HD access:Clotted LUE AVG.Temp HD cath 10/18 - removed before use of declotted  AVGG; now s/p declot 10/20.AppreciateIRassistance.using [place] today - needs AF at outpatient unit after d/c  6. ESRD:MWF - no issues Friday - next HD Monday 7. Hypertension/volume:BP controlled - minimal edema. UF as tolerated.Wt's are variable..Bed weight pre HD is 75.1 no volume on admission on CXR - might be able to go up more - keep SBP > 95 Had BP drop on HD Friday - kept even the rest of treatment with net UF 590 and post wt 75  Significantly above prior edw - BP higher than previously this am - watch - needs to have sitting and supine BP pre HD Monday Amlodipine decreased to 5  8. Anemia:Hgb9 > 8.5 > 9.2  > 8.3   - Aranesp 47mcg IV given 10/20.Marland Kitchen On much higher dose PTA - resume prior dose at d/c - repeat CBC Monday- if hgb low Monday - redose ESA sooner 9. Metabolic bone disease:Caok - not on binders -P in goal resumed VDRA. 10. T2DM: Per primary. 11. Nutrition - eating poorly - only had muffin for breakfast. - agreeable to nepro - ordered  12. Disp: Son available most of the time. Working towards d/c home with [name], [name] [phone] 12/25/2019,9:19 AM  LOS: 7 days   Subjective:  Had BM - no c/o of weak  and dizzy . Can't stand for BP but can sit on the side of the bed.  Ibuprofen added for pain control  Objective Vitals:   12/24/19 1452 12/24/19 1923 12/25/19 0325 12/25/19 0900  BP: (!) 155/80 107/62 (!) 141/71 136/67  Pulse: 72 69 69 70  Resp: 17 20 16 17   Temp: 98.6 F (37 C) 98 F (36.7 C) 98.3 F (36.8 C) 97.6 F (36.4 C)  TempSrc: Oral Oral Oral Oral  SpO2: 97% 99% 95% 95%  Weight:      Height:       Physical Exam  General: NAD  Heart: RRR Lungs: no rales Abdomen: soft NT Extremities: no edema Dialysis Access:  Temp cath out left AVGG + bruit   Additional Objective No intake or output data in the 24 hours ending 12/25/19 0927   Labs: Basic Metabolic Panel: Recent Labs  Lab 12/23/19 0307 12/24/19 0749 12/25/19 0315  NA 131* 138 136  K 3.6 3.9 4.1  CL 94* 98 98  CO2 25 28 26   GLUCOSE 122* 121* 95  BUN 44* 22 34*  CREATININE 6.68* 4.70* 6.29*  CALCIUM 8.6* 9.2 8.9  PHOS  --  3.1  --    CBC: Recent Labs  Lab 12/20/19 1700 12/20/19 1700 12/21/19 0233 12/21/19 0233 12/23/19 0307 12/24/19 0749 12/25/19 0315  WBC 10.1   < > 9.8   < > 10.1 8.9 7.5  NEUTROABS 7.2   < > 7.1   < >  6.7 5.5 4.0  HGB 9.2*   < > 9.0*   < > 8.5* 9.2* 8.3*  HCT 29.2*   < > 27.9*   < > 26.6* 29.1* 26.3*  MCV 84.6  --  85.6  --  86.6 87.7 86.8  PLT 197   < > 194   < > 165 183 183   < > = values in this interval not displayed.   Studies/Results: No results found. Medications:  . acetaminophen  1,000 mg Oral Q6H  . alteplase  8 mg Intracatheter Once  . amLODipine  5 mg Oral Daily  . atorvastatin  10 mg Oral QPM  . calcitRIOL  0.25 mcg Oral Once per day on Mon Wed Fri  . Chlorhexidine Gluconate Cloth  6 each Topical Q0600  . darbepoetin (ARANESP) injection - DIALYSIS  60 mcg Intravenous Q Wed-HD  . escitalopram  10 mg Oral Daily  . ibuprofen  600 mg Oral Q6H  . influenza vaccine adjuvanted  0.5 mL Intramuscular Tomorrow-1000  . lidocaine  1 patch Transdermal Q24H  .  multivitamin  1 tablet Oral QHS  . polyethylene glycol  17 g Oral BID  . ramelteon  8 mg Oral QHS  . senna-docusate  2 tablet Oral BID

## 2019-12-26 ENCOUNTER — Other Ambulatory Visit (HOSPITAL_COMMUNITY): Payer: Self-pay | Admitting: Student in an Organized Health Care Education/Training Program

## 2019-12-26 DIAGNOSIS — I5032 Chronic diastolic (congestive) heart failure: Secondary | ICD-10-CM

## 2019-12-26 DIAGNOSIS — I132 Hypertensive heart and chronic kidney disease with heart failure and with stage 5 chronic kidney disease, or end stage renal disease: Secondary | ICD-10-CM

## 2019-12-26 LAB — RENAL FUNCTION PANEL
Albumin: 2.4 g/dL — ABNORMAL LOW (ref 3.5–5.0)
Anion gap: 15 (ref 5–15)
BUN: 50 mg/dL — ABNORMAL HIGH (ref 8–23)
CO2: 24 mmol/L (ref 22–32)
Calcium: 8.8 mg/dL — ABNORMAL LOW (ref 8.9–10.3)
Chloride: 95 mmol/L — ABNORMAL LOW (ref 98–111)
Creatinine, Ser: 8.49 mg/dL — ABNORMAL HIGH (ref 0.44–1.00)
GFR, Estimated: 5 mL/min — ABNORMAL LOW (ref >60)
Glucose, Bld: 105 mg/dL — ABNORMAL HIGH (ref 70–99)
Phosphorus: 4.8 mg/dL — ABNORMAL HIGH (ref 2.5–4.6)
Potassium: 4.4 mmol/L (ref 3.5–5.1)
Sodium: 134 mmol/L — ABNORMAL LOW (ref 135–145)

## 2019-12-26 LAB — GLUCOSE, CAPILLARY: Glucose-Capillary: 93 mg/dL (ref 70–99)

## 2019-12-26 LAB — CBC
HCT: 26.7 % — ABNORMAL LOW (ref 36.0–46.0)
Hemoglobin: 8.4 g/dL — ABNORMAL LOW (ref 12.0–15.0)
MCH: 26.9 pg (ref 26.0–34.0)
MCHC: 31.5 g/dL (ref 30.0–36.0)
MCV: 85.6 fL (ref 80.0–100.0)
Platelets: 216 10*3/uL (ref 150–400)
RBC: 3.12 MIL/uL — ABNORMAL LOW (ref 3.87–5.11)
RDW: 17.8 % — ABNORMAL HIGH (ref 11.5–15.5)
WBC: 8 10*3/uL (ref 4.0–10.5)
nRBC: 0.2 % (ref 0.0–0.2)

## 2019-12-26 MED ORDER — LIDOCAINE 5 % EX PTCH: 1.0000 | MEDICATED_PATCH | CUTANEOUS | 0 refills | Status: AC

## 2019-12-26 MED ORDER — HYDROMORPHONE HCL 2 MG PO TABS: 1.0000 mg | ORAL_TABLET | Freq: Four times a day (QID) | ORAL | 0 refills | Status: DC | PRN

## 2019-12-26 MED ORDER — AMLODIPINE BESYLATE 5 MG PO TABS
5.0000 mg | ORAL_TABLET | Freq: Every day | ORAL | 0 refills | Status: DC
Start: 2019-12-27 — End: 2019-12-27

## 2019-12-26 MED ORDER — ACETAMINOPHEN 500 MG PO TABS: 1000.0000 mg | ORAL_TABLET | Freq: Four times a day (QID) | ORAL | 0 refills | Status: DC

## 2019-12-26 MED ORDER — ACETAMINOPHEN 500 MG PO TABS
ORAL_TABLET | ORAL | Status: AC
  Filled 2019-12-26: qty 2

## 2019-12-26 MED ORDER — IBUPROFEN 600 MG PO TABS
600.0000 mg | ORAL_TABLET | Freq: Four times a day (QID) | ORAL | 0 refills | Status: DC
Start: 2019-12-26 — End: 2019-12-26

## 2019-12-26 MED FILL — ACETAMINOPHEN 500MG XT STRE: 500 | 3 days supply | Qty: 30 | Fill #0

## 2019-12-26 MED FILL — AMLODIPINE BESYLATE 5 MG TA: 5 | 30 days supply | Qty: 30 | Fill #0

## 2019-12-26 MED FILL — HYDROmorphone HCL 2 MG TABS: 2 | 5 days supply | Qty: 10 | Fill #0

## 2019-12-26 MED FILL — IBUPROFEN 600 MG TABLET: 600 | 7 days supply | Qty: 30 | Fill #0

## 2019-12-26 NOTE — TOC Transition Note (Signed)
Transition of Care [facility]) - CM/SW Discharge Note   Patient Details  Name: Bianca Myers MRN: [id number] Date of Birth: 1946-01-04  [facility] CM/SW Contact:  Sharin Mons, RN Phone Number: 12/26/2019, 4:24 PM   Clinical Narrative:     Patient will DC to: home Anticipated DC date: 12/26/2019 Family notified: son Transport by: car   Per MD patient ready for DC today . RN, patient,  and patient's son notified of DC.  Pt agreeable to home health services. Pt without preference. Referral made with [facility] and accepted, SOC to begin 10/28. [place] pharmacy to deliver Rx meds to bedside prior to d/c. Pt with noted post hospital f/u on AVS.  RNCM will sign off for now as intervention is no longer needed. Please consult [place] again if new needs arise.  Doristine Bosworth (Son)      [phone]       Final next level of care: Home w Home Health Services Barriers to Discharge: No Barriers Identified   Patient Goals and CMS Choice Patient states their goals for this hospitalization and ongoing recovery are:: to get better CMS Medicare.gov Compare Post Acute Care list provided to:: Patient Choice offered to / list presented to : Patient  Discharge Placement                       Discharge Plan and Services                          HH Arranged: PT, OT, Nurse's Aide [place] Agency: [facility] Date [facility] Agency Contacted: 12/26/19 Time [place]: 6568 Representative spoke with at [place]: [place] ([place]) Interventions     Readmission Risk Interventions No flowsheet data found.

## 2019-12-26 NOTE — Discharge Instructions (Signed)
Dear Bianca Myers,   Thank you so much for allowing [place] to be part of your care!  You were admitted to [facility] for rib fractures.   POST-HOSPITAL & CARE INSTRUCTIONS 1. Please begin taking Ibuprofen and Tylenol for pain. Dilaudid should be for severe breakthrough pain. 2. Neurosurgery would like to follow-up in 2 weeks concerning you chronic Cervical vertebra fracture found on imaging early in hospitalization. Neurosurgery Office # [phone] 3. Please let PCP/Specialists know of this hospitalization and that your labetalol was discontinued and amlodipine was decreased to 5mg  daily due to concern for hypotension (low blood pressure). 4. Please see medications section of this packet for any medication changes.     Take care and be well!  Internal [facility]  [place], [place] [address] [phone]  [place]   1. PAIN CONTROL:  1. Pain is best controlled by a usual combination of three different methods TOGETHER:  i. Ice/Heat ii. Over the counter pain medication iii. Prescription pain medication 2. You may experience some swelling and bruising in area of broken ribs. Ice packs or heating pads (30-60 minutes up to 6 times a day) will help. Use ice for the first few days to help decrease swelling and bruising, then switch to heat to help relax tight/sore spots and speed recovery. Some people prefer to use ice alone, heat alone, alternating between ice & heat. Experiment to what works for you. Swelling and bruising can take several weeks to resolve.  3. It is helpful to take an over-the-counter pain medication regularly for the first few weeks. Choose one of the following that works best for you:  i. Naproxen (Aleve, etc) Two 220mg  tabs twice a day ii. Ibuprofen (Advil, etc) Three 200mg  tabs four times a day (every meal & bedtime) iii. Acetaminophen (Tylenol,  etc) 500-650mg  four times a day (every meal & bedtime) 4. A prescription for pain medication (such as oxycodone, hydrocodone, etc) may be given to you upon discharge. Take your pain medication as prescribed.  i. If you are having problems/concerns with the prescription medicine (does not control pain, nausea, vomiting, rash, itching, etc), please call us [phone] to see if we need to switch you to a different pain medicine that will work better for you and/or control your side effect better. ii. If you need a refill on your pain medication, please contact your pharmacy. They will contact our office to request authorization. Prescriptions will not be filled after 5 pm or on week-ends. 1. Avoid getting constipated. When taking pain medications, it is common to experience some constipation. Increasing fluid intake and taking a fiber supplement (such as Metamucil, Citrucel, FiberCon, MiraLax, etc) 1-2 times a day regularly will usually help prevent this problem from occurring. A mild laxative (prune juice, Milk of Magnesia, MiraLax, etc) should be taken according to package directions if there are no bowel movements after 48 hours.  2. Watch out for diarrhea. If you have many loose bowel movements, simplify your diet to bland foods & liquids for a few days. Stop any stool softeners and decrease your fiber supplement. Switching to mild anti-diarrheal medications (Kayopectate, Pepto Bismol) can help. If this worsens or does not improve, please call us. 3. FOLLOW UP  a. If a follow up appointment is needed one will be scheduled for you. If none is needed with our trauma team, please follow up with your primary  care provider within 2-3 weeks from discharge. Please call [facility] at [phone] if you have any questions about follow up.  b. If you have any orthopedic or other injuries you will need to follow up as outlined in your follow up instructions.   WHEN TO CALL us [phone]:  1. Poor pain  control 2. Reactions / problems with new medications (rash/itching, nausea, etc)  3. Fever over 101.5 F (38.5 C) 4. Worsening swelling or bruising 5. Worsening pain, productive cough, difficulty breathing or any other concerning symptoms  The clinic staff is available to answer your questions during regular business hours (8:30am-5pm). Please dont hesitate to call and ask to speak to one of our nurses for clinical concerns.  If you have a medical emergency, go to the nearest emergency room or call 911.  A surgeon from [facility] is always on call at the [facility], [place], [place], [place], [place] [address] ?  MAIN: [phone] ? TOLL FREE: [phone] ?  FAX [phone]  [email or web address]      Information on Rib Fractures  A rib fracture is a break or crack in one of the bones of the ribs. The ribs are long, curved bones that wrap around your chest and attach to your spine and your breastbone. The ribs protect your heart, lungs, and other organs in the chest. A broken or cracked rib is often painful but is not usually serious. Most rib fractures heal on their own over time. However, rib fractures can be more serious if multiple ribs are broken or if broken ribs move out of place and push against other structures or organs. What are the causes? This condition is caused by:  Repetitive movements with high force, such as pitching a baseball or having severe coughing spells.  A direct blow to the chest, such as a sports injury, a car accident, or a fall.  Cancer that has spread to the bones, which can weaken bones and cause them to break. What are the signs or symptoms? Symptoms of this condition include:  Pain when you breathe in or cough.  Pain when someone presses on the injured area.  Feeling short of breath. How is this diagnosed? This condition is diagnosed with a physical exam and medical  history. Imaging tests may also be done, such as:  Chest X-ray.  CT scan.  MRI.  Bone scan.  Chest ultrasound. How is this treated? Treatment for this condition depends on the severity of the fracture. Most rib fractures usually heal on their own in 1-3 months. Sometimes healing takes longer if there is a cough that does not stop or if there are other activities that make the injury worse (aggravating factors). While you heal, you will be given medicines to control the pain. You will also be taught deep breathing exercises. Severe injuries may require hospitalization or surgery. Follow these instructions at home: Managing pain, stiffness, and swelling  If directed, apply ice to the injured area. ? Put ice in a plastic bag. ? Place a towel between your skin and the bag. ? Leave the ice on for 20 minutes, 2-3 times a day.  Take over-the-counter and prescription medicines only as told by your health care provider. Activity  Avoid a lot of activity and any activities or movements that cause pain. Be careful during activities and avoid bumping the injured rib.  Slowly increase your activity as told by your health  care provider. General instructions  Do deep breathing exercises as told by your health care provider. This helps prevent pneumonia, which is a common complication of a broken rib. Your health care provider may instruct you to: ? Take deep breaths several times a day. ? Try to cough several times a day, holding a pillow against the injured area. ? Use a device called incentive spirometer to practice deep breathing several times a day.  Drink enough fluid to keep your urine pale yellow.  Do not wear a rib belt or binder. These restrict breathing, which can lead to pneumonia.  Keep all follow-up visits as told by your health care provider. This is important. Contact a health care provider if:  You have a fever. Get help right away if:  You have difficulty breathing or  you are short of breath.  You develop a cough that does not stop, or you cough up thick or bloody sputum.  You have nausea, vomiting, or pain in your abdomen.  Your pain gets worse and medicine does not help. Summary  A rib fracture is a break or crack in one of the bones of the ribs.  A broken or cracked rib is often painful but is not usually serious.  Most rib fractures heal on their own over time.  Treatment for this condition depends on the severity of the fracture.  Avoid a lot of activity and any activities or movements that cause pain. This information is not intended to replace advice given to you by your health care provider. Make sure you discuss any questions you have with your health care provider. Document Released: 02/17/2005 Document Revised: 05/19/2016 Document Reviewed: 05/19/2016 Elsevier Interactive Patient Education  2019 [employer].

## 2019-12-26 NOTE — Progress Notes (Signed)
PT Cancellation Note  Patient Details Name: Bianca Myers MRN: [id number] DOB: 1945/08/07   Cancelled Treatment:    Reason Eval/Treat Not Completed: Pain limiting ability to participate;Fatigue/lethargy limiting ability to participate Upon arrival, patient with washcloth over forehead with complaints of nausea and feeling of the room spinning. Patient refused therapy session this date. PT will re-attempt as time allows.   Perrin Maltese, PT, DPT Acute Rehabilitation Services Pager [phone] Office [phone]    Alda Lea 12/26/2019, 2:11 PM

## 2019-12-26 NOTE — Progress Notes (Signed)
Discharge summary packet provided to pt/son with instructions. Pt/son verbalized understanding of instructions. No complaints voiced.Pt alert/oriented in no apparent distress. D/C to home as ordered.

## 2019-12-26 NOTE — Progress Notes (Addendum)
Subjective: Patient reports that she is "meh" when asked how she is feeling. Patient reports that she still prefers Dilaudid for her pain, but is willing to do "what it takes to get home" including taking ibuprofen instead for pain. Patient reports that HD makes her head spin  Objective:  Vital signs in last 24 hours: Vitals:   12/26/19 1211 12/26/19 1227 12/26/19 1231 12/26/19 1310  BP: (!) 157/68 (!) 149/59 114/65 127/74  Pulse: 73  84   Resp:      Temp:   98.5 F (36.9 C) 98.6 F (37 C)  TempSrc:   Oral Oral  SpO2:   96%   Weight:   74.8 kg   Height:       Physical Exam Constitutional:      Appearance: Normal appearance.  HENT:     Head: Normocephalic and atraumatic.  Cardiovascular:     Rate and Rhythm: Normal rate and regular rhythm.  Pulmonary:     Effort: Pulmonary effort is normal.     Breath sounds: Normal breath sounds.  Abdominal:     General: Bowel sounds are normal.     Palpations: Abdomen is soft.     Tenderness: There is no abdominal tenderness.  Musculoskeletal:     Comments: Patient able to get herself out of reclining position and can swing her leads over the side of the bed and sit upright on her own. Patient is obviously in pain, but is able to this on her own.  Skin:    General: Skin is warm and dry.  Neurological:     General: No focal deficit present.     Mental Status: She is alert.     Assessment/Plan:  Principal Problem:   Fall Active Problems:   End-stage renal disease on hemodialysis ([place])   Diabetes mellitus ([place])   Multiple rib fractures   Hyperkalemia   Thoracic ascending aortic aneurysm ([place])  Fall Fractured ribs and Cervical vertebra Patient had a mechanical fall and found to have rib fractures. She continues to have pain from displaced fractures of the posterior right 7th-11th ribs.  We will schedule ibuprofen and increase Tylenol dosage.  Patient would like to be discharged home instead of going to SNF. Son will be able to  provide care at home.TOC is working to get [facility]. Patient is stable for discharge, but will require assistance and needs therapy to further increase strength and endurance.  -Schedule Tylenol 1000 every 6 hours -Scheduled ibuprofen 600 mg every 6 hours - as needed Dilaudid 1 mg for severe breakthrough pain one time - Lidocaine patch -  Robaxin 500mg  q 6 PRN - Incentive spirometry - Pulm toilet - HHPT/OT and if possible aide   HTN HFpEF Hx of HTN and grade II Diastolic HF.  Home medications: Norvasc 10mg , Labetalol 200 mg BID.  Given falls and hypotension noted during admission we have decreased home medications -Continue Norvasc to 5mg  daily - hold Labetalol  - hold lasix   Mood disorder Patient home medication includes Lexapro 10mg . - Continue home Lexapro 10mg    HLD - Continue home atorvastatin    ESRD (MWF) Patient is ESRD with HD on MWF.  Last HD session today, lowered dry weight goal -Per nephrology   Constipation Patient reporting constipation. - Miralax - senna-docusate   Prior to Admission Living Arrangement: Home Anticipated Discharge Location: Home w/ HHPT and HHOT Barriers to Discharge: Disp Dispo: Anticipated discharge in approximately 1 day(s).   Freida Busman, MD 12/26/2019,  1:47 PM Pager: [phone] After 5pm on weekdays and 1pm on weekends: On Call pager [phone]

## 2019-12-26 NOTE — Care Management Important Message (Signed)
Important Message  Patient Details  Name: Bianca Myers MRN: [id number] Date of Birth: 07-01-1945   Medicare Important Message Given:  Yes - Important Message mailed due to current National Emergency  Verbal consent obtained due to current National Emergency  Relationship to patient: Self Contact Name: Miroslava Santellan Call Date: 12/26/19  Time: [place] Phone: [phone] Outcome: No Answer/Busy Important Message mailed to: Patient address on file    Delorse Lek 12/26/2019, 2:58 PM

## 2019-12-26 NOTE — Progress Notes (Signed)
[facility] Progress Note   Subjective:  Seen at start on HD - getting orthostatic vitals. Sitting/supine SBP 170's, dropped to SBP 130 range with standing. No dyspnea or edema. Her pre-HD weight is 76kg - minimal UF planned today.  Objective Vitals:   12/25/19 0900 12/25/19 1720 12/25/19 1943 12/26/19 0411  BP: 136/67 139/73 (!) 154/69 (!) 167/69  Pulse: 70 70 65 64  Resp: 17 18 17 17   Temp: 97.6 F (36.4 C) 98.2 F (36.8 C) 98.4 F (36.9 C) 98.1 F (36.7 C)  TempSrc: Oral Oral Oral Oral  SpO2: 95% 95% 98% 98%  Weight:      Height:       Physical Exam General: Well appearing woman, NAD Heart: RRR; no murmur Lungs: CTAB; no rales Abdomen: soft, non-tender Extremities: No LE edema Dialysis Access: L AVG + bruit  Additional Objective Labs: Basic Metabolic Panel: Recent Labs  Lab 12/24/19 0749 12/25/19 0315 12/25/19 1244  NA 138 136 134*  K 3.9 4.1 4.2  CL 98 98 95*  CO2 28 26 23   GLUCOSE 121* 95 192*  BUN 22 34* 37*  CREATININE 4.70* 6.29* 6.90*  CALCIUM 9.2 8.9 9.0  PHOS 3.1  --  4.3   Liver Function Tests: Recent Labs  Lab 12/25/19 1244  ALBUMIN 2.6*   CBC: Recent Labs  Lab 12/21/19 0233 12/21/19 0233 12/23/19 0307 12/23/19 0307 12/24/19 0749 12/25/19 0315 12/25/19 1244  WBC 9.8   < > 10.1   < > 8.9 7.5 8.6  NEUTROABS 7.1   < > 6.7  --  5.5 4.0  --   HGB 9.0*   < > 8.5*   < > 9.2* 8.3* 8.9*  HCT 27.9*   < > 26.6*   < > 29.1* 26.3* 29.1*  MCV 85.6  --  86.6  --  87.7 86.8 87.1  PLT 194   < > 165   < > 183 183 174   < > = values in this interval not displayed.   Medications: . sodium chloride    . sodium chloride     . acetaminophen  1,000 mg Oral Q6H  . alteplase  8 mg Intracatheter Once  . amLODipine  5 mg Oral Daily  . atorvastatin  10 mg Oral QPM  . calcitRIOL  0.25 mcg Oral Once per day on Mon Wed Fri  . Chlorhexidine Gluconate Cloth  6 each Topical Q0600  . darbepoetin (ARANESP) injection - DIALYSIS  60 mcg  Intravenous Q Wed-HD  . escitalopram  10 mg Oral Daily  . feeding supplement (NEPRO CARB STEADY)  237 mL Oral BID BM  . ibuprofen  600 mg Oral Q6H  . influenza vaccine adjuvanted  0.5 mL Intramuscular Tomorrow-1000  . lidocaine  1 patch Transdermal Q24H  . multivitamin  1 tablet Oral QHS  . polyethylene glycol  17 g Oral BID  . ramelteon  8 mg Oral QHS  . senna-docusate  2 tablet Oral BID    Dialysis Orders: MWF [place]  3h56min400/800 70.5kg 2 /2.25 bathLUE AVG Hepnone - Mircera 138mcg IV q 2 weeks - Calcitriol 0.62mcg PO q HD  Assessment/Plan: 1. C7 + R 7-11th rib fractures: Pain control per primary. Still requiring dilaudid prn. Cervical Fxdeemedchronic in nature.  2. Fall/ possible syncopal episode:Was after dialysis, possibly mild volume depletion. EDW recently raised 0.5kg - likely needs more. Amlodipine was reduced 10mg  -> 5mg  QD.  3. Enlarged thoracic aorta: Will f/u with [facility] as outpatient. 4. Hyperkalemia:  On admit, resolved with HD.  5. HD access:Clotted LUE AVG on admit, required temp HD cath placement 10/18. Now s/p AVG declot 10/20.AppreciateIRassistance. 6. ESRD:Continue HD per usual MWF schedule - HD today. 7. Hypertension/volume:BP drops with minimal UF on HD. Significantly above prior EDW but looks euvolemic, will raise EDW on discharge.  8. Anemia:Hgb 8.9. Aranesp 92mcg IV given 10/20. On much higher dose PTA - resume prior dose at d/c. 9. Metabolic bone disease:Ca/Phos ok. Not on binders. Continue home calcitriol. 10. T2DM: Per primary. 11. Nutrition: Low appetite, continue protein supplements. 12. Disp: Son available most of the time. Working towards d/c home with [employer], Hershal Coria 12/26/2019, 8:11 AM  [employer]

## 2019-12-28 NOTE — Discharge Summary (Addendum)
Name: Bianca Myers MRN: [id number] DOB: 02-Aug-1945 74 y.o. PCP: Bianca Berger, MD  Date of Admission: 12/17/2019  7:37 PM Date of Discharge: 12/26/2019 Attending Physician:Dr. Lenice Myers  Discharge Diagnosis: 1. Principal Problem:   Fall Active Problems:   End-stage renal disease on hemodialysis ([place])   Diabetes mellitus ([place])   Multiple rib fractures   Hyperkalemia   Thoracic ascending aortic aneurysm [facility])   Discharge Medications: Allergies as of 12/26/2019   No Known Allergies      Medication List     STOP taking these medications    acetaminophen 650 MG CR tablet Commonly known as: TYLENOL Replaced by: acetaminophen 500 MG tablet   enoxaparin 30 MG/0.3ML injection Commonly known as: LOVENOX   furosemide 40 MG tablet Commonly known as: LASIX   labetalol 200 MG tablet Commonly known as: NORMODYNE   Oxycodone HCl 10 MG Tabs       TAKE these medications    acetaminophen 500 MG tablet Commonly known as: TYLENOL Take 2 tablets (1,000 mg total) by mouth every 6 (six) hours. Replaces: acetaminophen 650 MG CR tablet   amLODipine 5 MG tablet Commonly known as: NORVASC Take 1 tablet (5 mg total) by mouth daily. What changed:  medication strength how much to take   atorvastatin 10 MG tablet Commonly known as: LIPITOR Take 10 mg by mouth every evening.   Auryxia 1 GM 210 MG(Fe) tablet Generic drug: ferric citrate Take 420 mg by mouth 3 (three) times daily with meals.   Cholecalciferol 100 MCG (4000 UT) Caps Take 4,000 Units by mouth daily.   escitalopram 10 MG tablet Commonly known as: LEXAPRO Take 10 mg by mouth daily.   feeding supplement (NEPRO CARB STEADY) Liqd Take 240 mLs by mouth daily.   Fish Oil 1000 MG Caps Take 1,000 mg by mouth daily.   HYDROmorphone 2 MG tablet Commonly known as: DILAUDID Take 0.5 tablets (1 mg total) by mouth every 6 (six) hours as needed for up to 5 days for severe pain.   ibuprofen 600 MG  tablet Commonly known as: ADVIL Take 1 tablet (600 mg total) by mouth every 6 (six) hours.   lidocaine 5 % Commonly known as: LIDODERM Place 1 patch onto the skin daily for 15 days. Remove & Discard patch within 12 hours or as directed by MD   lidocaine-prilocaine cream Commonly known as: EMLA Apply 1 application topically as directed. Prior to Dialysis   loperamide 2 MG capsule Commonly known as: IMODIUM Take 4 mg by mouth every 4 (four) hours as needed for diarrhea or loose stools.   multivitamin Tabs tablet Take 1 tablet by mouth at bedtime.   polyethylene glycol 17 g packet Commonly known as: MiraLax Take 17 g by mouth daily as needed for moderate constipation. What changed: reasons to take this   Turmeric 500 MG Caps Take 500 mg by mouth daily.   vitamin Myers 1000 MG tablet Take 1,000 mg by mouth 2 (two) times a day.        Disposition and follow-up:   Ms.Bianca Myers was discharged from [facility] in Stable condition.  At the hospital follow up visit please address:  - Please address patient BP: Amlodipine was decreased from 10 to 72m due to concern for hypotension with HD. Patient Labetalol and Lasix were also discontinued for the same reasons.  - Nephrology was working on increasing her dry weight - Please reassess patient mobility and pain s/p fall - Patient should  be following up with neurosurgery to reassess C7 fracture  2.  Labs / imaging needed at time of follow-up: NONE  3.  Pending labs/ test needing follow-up: NONE  Follow-up Appointments:  Follow-up Information     Dawley, Bianca C, DO Follow up in 3 week(s).   Why: call for appointment Contact information: [address] [place] [address] [phone]         Bianca Myers. Call.   Why: As needed Contact information: [place] [phone]        Bianca Berger, MD Follow up.    Specialty: Family Medicine Contact information: [address] [place] [address] [place] Follow up.   Specialty: [facility] Why: home health services will be provided by Bianca Myers, start of care Thursday 10/28 Contact information: [address] [place] [address] [phone]                 Hospital Course by problem list: 74 year old woman with a history of ESRD on HD, T2DM, and HTN admitted after a mechanical fall.  Fall Fractured ribs and Cervical vertebra Patient presented after falling while trying to exit vehicle after HD. Patient had severe chest pain and presented 1 day after fall.  CT Head and Neck noted compression fracture of C7 of unknown age. Neurosurgery was consulted in the ED. MRI was done and concluded that the fracture may be chronic. Neurosurgery cleared patient Myers-spine and recommended 3 week follow-up for the C7 compression fracture. CT chest noted fractures of the Right ribs 7-11 with no pneumothorax. X ray of ankle did not show bony abnormality. Trauma surgery was consulted and did not feel surgery was required but implemented preferred pain control regimen and pulm toilet. Patient did not want to take ibuprofuen and was initially provided Tylenol, Oxycodone, Lidocaine patch, Robaxin, and Dilaudid for breakthrough pain. Patient preferred Dilaudid and oxycodone was discontinued. Eventually patient became responsive to ibuprofen therapy and it was incorporated into his regimen. Patient pain became more manageable and patient became more mobile. Patient worked with PT and OT and was recommended HHPT/OT if patient declined SNF, which patient did decline.   HTN HFpEF Patient was restarted on amlodipine half home dose per nephrology, but labetalol and lasix were held due to concern for hypotension in HD.   HLD  Continued patient home medication Lipitor on her T, TH, S , Sun  schedule.  Mood disorder Continued patient on home Lexapro once approved for diet.  ESRD (MWF) Patient was noted to be hyperkalemic at presentation despite HD compliance. Patient went to urgent HD but graft was found to have clotted. IR placed temp cath and patient was able to received HD on her scehdule and hyperkalemia resolved. IR was later able to declot the graft and removed the temp cath. Patient completed HD according to her schedule with her normal cath. Nephrology attempted to increase patient dry weight as patient complained of dizziness in HD and found herself feeling poorly after HD. Her fall leading to hospitalization occurred after HD.   Constipation Patient began reporting constipation inpatient. Patient responded to Miralax with senna-docusate.    Discharge Vitals:   BP 127/74 (BP Location: Right Arm)   Pulse 84   Temp 98.6 F (37 Myers) (Oral)   Resp 17   Ht 5'  7" (1.702 m)   Wt 74.8 kg Comment: stood to scale   SpO2 96%   BMI 25.83 kg/m   Pertinent Labs, Studies, and Procedures:  DG Ribs Unilateral W/Chest Right  Result Date: 12/17/2019 CLINICAL DATA:  Fall with right-sided chest pain. EXAM: RIGHT RIBS AND CHEST - 3+ VIEW COMPARISON:  Chest radiograph and CT 06/07/2019 FINDINGS: The cardiomediastinal silhouette is unchanged with normal heart size. Aortic atherosclerosis is noted. Bilateral upper lobe scarring and asymmetric right upper lobe volume loss are similar to the prior radiograph. No acute airspace consolidation, edema, sizable pleural effusion, or pneumothorax is identified. There are minimally displaced right lateral sixth through ninth rib fractures. A vascular stent is noted in the left upper arm. IMPRESSION: Minimally displaced right lateral sixth through ninth rib fractures. Electronically Signed   By: Logan Bores M.D.   On: 12/17/2019 20:57   DG Ankle Complete Left  Result Date: 12/18/2019 CLINICAL DATA:  Left ankle pain. EXAM: LEFT ANKLE COMPLETE - 3+  VIEW COMPARISON:  None. FINDINGS: Bilateral soft tissue swelling. No fractures or dislocation. Vascular calcifications. IMPRESSION: Bilateral soft tissue swelling. No acute abnormalities otherwise seen. Electronically Signed   By: Dorise Bullion III M.D   On: 12/18/2019 12:48   CT Head Wo Contrast  Result Date: 12/18/2019 CLINICAL DATA:  Pain following fall EXAM: CT HEAD WITHOUT CONTRAST CT CERVICAL SPINE WITHOUT CONTRAST TECHNIQUE: Multidetector CT imaging of the head and cervical spine was performed following the standard protocol without intravenous contrast. Multiplanar CT image reconstructions of the cervical spine were also generated. COMPARISON:  CT head and CT cervical spine June 07, 2019 FINDINGS: CT HEAD FINDINGS Brain: There is stable age related volume loss. Stable mild invagination of CSF into the sella. There is no intracranial mass, hemorrhage, extra-axial fluid collection, or midline shift. There is patchy small vessel disease in the centra semiovale bilaterally, stable. There is small vessel disease in the anterior limbs of each internal and external capsule. No evident acute infarct. Vascular: No appreciable hyperdense vessels. There is calcification in the distal vertebral arteries and carotid siphon regions bilaterally. Skull: The bony calvarium appears intact. Sinuses/Orbits: There is mild mucosal thickening in several ethmoid air cells. Other visualized paranasal sinuses are clear. Orbits appear symmetric bilaterally. Other: Mastoid air cells are clear. CT CERVICAL SPINE FINDINGS Alignment: There is no appreciable spondylolisthesis. Skull base and vertebrae: Skull base and craniocervical junction regions appear normal. There is anterior wedging of the C7 vertebral body which was not present previously and appears potentially acute. There is no retropulsion of bone in this area. There is no other evidence suggesting fracture. No blastic or lytic bone lesions. Soft tissues and spinal canal:  Prevertebral soft tissues and predental space regions are normal. No cord or canal hematoma. No paraspinous lesions are evident. Disc levels: There is moderate disc space narrowing at C4-5 with milder disc space narrowing at C6-7, stable. There is facet hypertrophy at multiple levels bilaterally. There is impression on exiting nerve roots at C5-6 on the right and at C6-7 bilaterally. No frank disc extrusion or stenosis. Upper chest: There is scarring in each upper lobe, stable Other: There is calcification in each carotid artery. IMPRESSION: CT head: Age related volume loss. Patchy supratentorial small vessel disease, stable. No acute infarct. No mass or hemorrhage. Foci of arterial vascular calcification noted. Mucosal thickening in several ethmoid air cells noted. CT cervical spine: 1. Anterior wedging of the C7 vertebral body, a recent and probably acute. No retropulsion of bone.  No other fracture evident. No spondylolisthesis. 2. Multilevel osteoarthritic change with exit foraminal narrowing impressing on exiting nerve roots on the right at C5-6 and C6-7 bilaterally. No frank disc extrusion or stenosis. 3.  Stable upper lobe parenchymal lung scarring. 4.  Carotid artery calcification bilaterally. Critical Value/emergent results were called by telephone at the time of interpretation on 12/18/2019 at 10:12 am to provider Krista Blue, PA, who verbally acknowledged these results. Electronically Signed   By: Lowella Grip III M.D.   On: 12/18/2019 10:14   CT Chest Wo Contrast  Result Date: 12/18/2019 CLINICAL DATA:  Rib fracture suspected, traumatic EXAM: CT CHEST WITHOUT CONTRAST TECHNIQUE: Multidetector CT imaging of the chest was performed following the standard protocol without IV contrast. COMPARISON:  06/07/2019 FINDINGS: Cardiovascular: Aortic atherosclerosis. New enlargement of the tubular ascending thoracic aorta, measuring up to 4.0 x 4.0 cm. Normal heart size. Three-vessel coronary artery  calcifications. No pericardial effusion. Mediastinum/Nodes: No enlarged mediastinal, hilar, or axillary lymph nodes. Thyroid gland, trachea, and esophagus demonstrate no significant findings. Lungs/Pleura: There is a redemonstrated pattern of pulmonary fibrosis featuring unusual, upper lobe predominant peribronchovascular areas of fibrotic consolidation, architectural distortion, bronchiectasis, and bronchiolectasis (series 4, image 34, 52). Mild centrilobular emphysema. Trace right pleural effusion. Upper Abdomen: No acute abnormality. Large cyst or hemangioma of the superior spleen, not significant changed compared to prior examination. Atrophic kidneys, partially imaged. Musculoskeletal: No chest wall mass or suspicious bone lesions identified. There are minimally displaced fractures of the posterior right seventh through eleventh ribs. IMPRESSION: 1. There are minimally displaced fractures of the posterior right seventh through eleventh ribs. Trace right pleural effusion. No pneumothorax. 2. New enlargement of the tubular ascending thoracic aorta, measuring up to 4.0 x 4.0 cm, somewhat concerning for acute aortic injury in the setting of trauma. Consider contrast enhanced CT angiogram to further evaluate. Aortic Atherosclerosis (ICD10-I70.0) 3. There is a redemonstrated pattern of pulmonary fibrosis featuring unusual, upper lobe predominant peribronchovascular areas of fibrotic consolidation, architectural distortion, bronchiectasis, and bronchiolectasis. Findings are in keeping with an "alternative diagnosis" pattern of fibrosis by ATS pulmonary fibrosis criteria, primary differential consideration chronic fibrotic hypersensitivity pneumonitis or other inflammatory inhalational lung disease, or alternately pulmonary sarcoidosis despite the absence of nodularity or mediastinal lymphadenopathy. 4. Mild emphysema.  Emphysema (ICD10-J43.9). 5. Coronary artery disease. Electronically Signed   By: Eddie Candle M.D.    On: 12/18/2019 10:17   CT Cervical Spine Wo Contrast  Result Date: 12/18/2019 CLINICAL DATA:  Pain following fall EXAM: CT HEAD WITHOUT CONTRAST CT CERVICAL SPINE WITHOUT CONTRAST TECHNIQUE: Multidetector CT imaging of the head and cervical spine was performed following the standard protocol without intravenous contrast. Multiplanar CT image reconstructions of the cervical spine were also generated. COMPARISON:  CT head and CT cervical spine June 07, 2019 FINDINGS: CT HEAD FINDINGS Brain: There is stable age related volume loss. Stable mild invagination of CSF into the sella. There is no intracranial mass, hemorrhage, extra-axial fluid collection, or midline shift. There is patchy small vessel disease in the centra semiovale bilaterally, stable. There is small vessel disease in the anterior limbs of each internal and external capsule. No evident acute infarct. Vascular: No appreciable hyperdense vessels. There is calcification in the distal vertebral arteries and carotid siphon regions bilaterally. Skull: The bony calvarium appears intact. Sinuses/Orbits: There is mild mucosal thickening in several ethmoid air cells. Other visualized paranasal sinuses are clear. Orbits appear symmetric bilaterally. Other: Mastoid air cells are clear. CT CERVICAL SPINE FINDINGS Alignment: There is no  appreciable spondylolisthesis. Skull base and vertebrae: Skull base and craniocervical junction regions appear normal. There is anterior wedging of the C7 vertebral body which was not present previously and appears potentially acute. There is no retropulsion of bone in this area. There is no other evidence suggesting fracture. No blastic or lytic bone lesions. Soft tissues and spinal canal: Prevertebral soft tissues and predental space regions are normal. No cord or canal hematoma. No paraspinous lesions are evident. Disc levels: There is moderate disc space narrowing at C4-5 with milder disc space narrowing at C6-7, stable. There  is facet hypertrophy at multiple levels bilaterally. There is impression on exiting nerve roots at C5-6 on the right and at C6-7 bilaterally. No frank disc extrusion or stenosis. Upper chest: There is scarring in each upper lobe, stable Other: There is calcification in each carotid artery. IMPRESSION: CT head: Age related volume loss. Patchy supratentorial small vessel disease, stable. No acute infarct. No mass or hemorrhage. Foci of arterial vascular calcification noted. Mucosal thickening in several ethmoid air cells noted. CT cervical spine: 1. Anterior wedging of the C7 vertebral body, a recent and probably acute. No retropulsion of bone. No other fracture evident. No spondylolisthesis. 2. Multilevel osteoarthritic change with exit foraminal narrowing impressing on exiting nerve roots on the right at C5-6 and C6-7 bilaterally. No frank disc extrusion or stenosis. 3.  Stable upper lobe parenchymal lung scarring. 4.  Carotid artery calcification bilaterally. Critical Value/emergent results were called by telephone at the time of interpretation on 12/18/2019 at 10:12 am to provider Krista Blue, PA, who verbally acknowledged these results. Electronically Signed   By: Lowella Grip III M.D.   On: 12/18/2019 10:14   MR CERVICAL SPINE WO CONTRAST  Result Date: 12/18/2019 CLINICAL DATA:  Spinal stenosis. Multiple acute right-sided rib fractures after a fall. EXAM: MRI CERVICAL SPINE WITHOUT CONTRAST TECHNIQUE: Multiplanar, multisequence MR imaging of the cervical spine was performed. No intravenous contrast was administered. COMPARISON:  None. FINDINGS: Despite efforts by the technologist and patient, motion artifact is present on today's exam and could not be eliminated. This reduces exam sensitivity and specificity. Alignment: No vertebral subluxation is observed. Vertebrae: Mild anterior wedging at C7 is present. I do not see significant edema signal in the C7 vertebral body on STIR images, but there is a  band of low T1 and T2 signal centrally in the C7 vertebral body (no corresponding visible fracture on the CT). Also, while the degree of anterior wedging is similar between the 06/07/2019 exam and the 01/18/2020 CT examination, the degree of sclerosis along the superior endplate of C7 may be minimally increased, although this is equivocal. Irregularity along the anterior superior endplate of C7 appears to be stable between the 2 CT examinations. There is also mild superior endplate concavity at the T2 vertebral level, not changed from 06/07/2019, an without vertebral edema. Disc desiccation is present throughout the cervical spine. Cord: Taking into account the degree of motion artifact, no abnormal cord signal is identified. Posterior Fossa, vertebral arteries, paraspinal tissues: Partially empty sella. Disc levels: C2-3: Unremarkable C3-4: Mild central narrowing of the thecal sac and borderline bilateral foraminal stenosis due to central disc protrusion, uncinate spurring, and facet arthropathy. C4-5: Prominent left and moderate right foraminal stenosis and moderate central narrowing of the thecal sac due to intervertebral spurring, disc bulge, uncinate spurring, and left facet arthropathy. C5-6: Moderate bilateral foraminal stenosis and moderate central narrowing of the thecal sac due to right eccentric disc bulge, intervertebral spurring, and facet  arthropathy. C6-7: Moderate left and mild right foraminal stenosis due to disc bulge, uncinate spurring, and facet arthropathy. C7-T1: Unremarkable. IMPRESSION: 1. There is no edema associated with the anterior wedging of C7. Retrospectively a roughly similar appearance is present on 06/07/2019 compared to today's CT, with mild sclerosis along the superior endplate and mild anterior wedging with the vertical height of the vertebral body being about 1.0 cm in the area of wedging. Accordingly I am skeptical of acute fracture, and I suspect this appearance is due to  chronic subsidence. 2. Cervical spondylosis and degenerative disc disease, causing multilevel impingement as detailed above. 3. Partially empty sella. 4. Despite efforts by the technologist and patient, motion artifact is present on today's exam and could not be eliminated. This reduces exam sensitivity and specificity. Electronically Signed   By: Van Clines M.D.   On: 12/18/2019 15:23   CT CHEST ABDOMEN PELVIS W CONTRAST  Result Date: 12/18/2019 CLINICAL DATA:  Trauma EXAM: CT CHEST, ABDOMEN, AND PELVIS WITH CONTRAST TECHNIQUE: Multidetector CT imaging of the chest, abdomen and pelvis was performed following the standard protocol during bolus administration of intravenous contrast. CONTRAST:  174m OMNIPAQUE IOHEXOL 300 MG/ML  SOLN COMPARISON:  Same day CT chest, 12/18/2019, CT chest abdomen pelvis, 06/07/2019 FINDINGS: CT CHEST FINDINGS Cardiovascular: Aortic atherosclerosis. As noted on prior examination, there is new enlargement of the tubular ascending thoracic aorta, measuring 4.0 x 3.9 cm, however without evidence of dissection or other acute pathology. Normal heart size. Three-vessel coronary artery calcification. No pericardial effusion. Mediastinum/Nodes: No enlarged mediastinal, hilar, or axillary lymph nodes. Thyroid gland, trachea, and esophagus demonstrate no significant findings. Lungs/Pleura: Trace right pleural effusion. Redemonstrated pattern of pulmonary fibrosis, described in detail on prior examination although featuring upper lobe predominant peribronchovascular areas of fibrotic consolidation, architectural distortion, bronchiectasis, bronchiolectasis. Mild centrilobular emphysema. Musculoskeletal: No chest wall mass or suspicious bone lesions identified. Redemonstrated minimally displaced fractures of the posterior right seventh through eleventh ribs, and additionally minimally fractures of the lateral right sixth through eighth ribs, not previously noted. CT ABDOMEN PELVIS  FINDINGS Hepatobiliary: No solid liver abnormality is seen. Distended gallbladder. Mild biliary ductal dilatation to the ampulla, CBD measuring up to 9 mm in caliber. No obstructing calculi identified. This appearance is similar to prior examination dated 06/07/2019. Pancreas: Unremarkable. No pancreatic ductal dilatation or surrounding inflammatory changes. Spleen: Redemonstrated incidental simple cyst of the superior spleen measuring 5.3 cm. Adrenals/Urinary Tract: Adrenal glands are unremarkable. Atrophic kidneys. No hydronephrosis. Bladder is unremarkable. Stomach/Bowel: Stomach is within normal limits. Appendix appears normal. No evidence of bowel wall thickening, distention, or inflammatory changes. Sigmoid diverticulosis. Vascular/Lymphatic: Aortic atherosclerosis. No enlarged abdominal or pelvic lymph nodes. Reproductive: Calcified uterine fibroids. Other: No abdominal wall hernia or abnormality. No abdominopelvic ascites. Musculoskeletal: Plate and screw fixation of the posterior right acetabulum. Status post left hip total arthroplasty. There are new, subacute to chronic fracture deformities of the right pubis (series 6, image 39). There are new sclerotic although nonacute fracture deformities of the right hemi sacrum (series 3, image 80). Focally very severe arthrosis of L5-S1. IMPRESSION: 1. As noted on prior examination, there is new enlargement of the tubular ascending thoracic aorta, measuring 4.0 x 3.9 cm, however without evidence of dissection or other acute pathology on this contrast enhanced examination. Recommend annual imaging followup by CTA or MRA. This recommendation follows 2010 ACCF/AHA/AATS/ACR/ASA/SCA/SCAI/SIR/STS/SVM Guidelines for the Diagnosis and Management of Patients with Thoracic Aortic Disease. Circulation. 2010; 121:: [id number] Aortic aneurysm NOS (ICD10-I71.9). 2. Multiple redemonstrated right-sided  rib fractures with associated trace pleural effusions. 3. New, subacute to  chronic fracture deformities of the right pubis. There are new sclerotic although nonacute fracture deformities of the right hemisacrum. These fractures were not noted on prior examination dated 06/07/2019. 4. No CT findings of acute traumatic injury to the organs of the chest, abdomen, or pelvis. 5. Redemonstrated unusual pattern of pulmonary fibrosis, described in detail on same day prior examination of the chest. Please see prior examination for discussion. 6. Renal atrophy, in keeping with dialysis dependent ESRD. 7. Coronary artery disease. Aortic Atherosclerosis (ICD10-I70.0). Electronically Signed   By: Eddie Candle M.D.   On: 12/18/2019 13:13   IR US Guide Vasc Access Left  Result Date: 12/21/2019 INDICATION: 74 year old female with history of end-stage renal disease on hemodialysis with clinical suspicion for thrombosis of left upper extremity arteriovenous graft. EXAM: 1. Ultrasound-guided antegrade venous access of left upper extremity arteriovenous graft 2. Pharmacomechanical thrombolysis of left upper extremity arteriovenous graft 3. Ultrasound-guided retrograde venous access of left upper extremity arteriovenous graft 4. Arteriovenous anastomosis balloon thrombectomy 5. Rheolytic thrombectomy of left upper extremity arteriovenous graft 6. Balloon maceration of left upper extremity arteriovenous graft 7. Completion fistulogram of left upper extremity graft COMPARISON:  None. MEDICATIONS: 4000 units heparin, intravenous CONTRAST:  64m OMNIPAQUE IOHEXOL 300 MG/ML  SOLN ANESTHESIA/SEDATION: Moderate (conscious) sedation was employed during this procedure. A total of Versed 0.5 mg and Fentanyl 37.5 mcg was administered intravenously. Moderate Sedation Time: 44 minutes. The patient's level of consciousness and vital signs were monitored continuously by radiology nursing throughout the procedure under my direct supervision. FLUOROSCOPY TIME:  6 minutes, 24 seconds, 3 mGy COMPLICATIONS: None immediate.  PROCEDURE: Informed written consent was obtained from the patient after a discussion of the risk, benefits and alternatives to treatment. Questions regarding the procedure were encouraged and answered. A timeout was performed prior to the initiation of the procedure. The left arm dialysis graft was prepped with Chlorhexidine in a sterile fashion, and a sterile drape was applied covering the operative field. Preprocedure ultrasound demonstrated complete thrombosis of the visualized portion indwelling brachial axillary graft. Using ultrasound guidance, 21 grade should micropuncture needle was inserted into the graft in antegrade fashion near the arterial anastomosis. A Wholey wire was then directed to the central veins over which the micropuncture sheath was exchanged for a 6 [place]. A 5 [place] the catheter was then directed to the level of the innominate vein. Hand injection of contrast was performed as the catheter was withdrawn to delineate the central aspect of the thrombosed graft which was at the level of the axillary vein. Next, pharmacomechanical thrombolysis was performed with 8 mg tPA admixed into 250 mg of heparinized saline. A total of 100 mg of the solution was administered from the antegrade sheath to the central portion of the occluded axillary vein. The tPA was allowed to dwell. Next, retrograde access was obtained with ultrasound guidance and a 21 gauge needle. A Glidewire was directed into the brachial artery, over which a 6 French sheath was placed. Fogarty balloon thrombectomy was performed with 3 sweeping passes through the arterial anastomosis. Next, rheolytic thrombectomy was performed through the venous outflow from the antegrade sheath. This was tolerated Bianca without physiologic reactive changes. This was followed by balloon maceration with a 7 mm x 4 cm mustang balloon from the peripheral aspect of the antegrade access to the axillary vein. Fistulagram was then performed from  the antegrade sheath which demonstrated brisk antegrade flow without flow-limiting  stenosis in the outflow portion of the graft. Retrograde access was then obtained into the brachial vein just proximal to the anastomosis with a Glidewire and Kumpe the catheter. Hand injection through the Kumpe the catheter demonstrated patent graft with brisk outflow. The sheaths were removed and hemostasis obtained with application of a 2-0 Prolene pursestring suture which will be removed at the patient's next dialysis session. A dressing was placed. The patient tolerated the procedure Bianca without immediate postprocedural complication. FINDINGS: Occluded indwelling brachial axillary graft. Successful declot as described above. Widely patent graft after declot. IMPRESSION: 1. Occluded indwelling left brachial artery to axillary vein dialysis graft. 2. Successful pharmacomechanical thrombolysis, arterial balloon thrombectomy, rheolytic thrombectomy, balloon maceration for recanalization of the left upper extremity graft. ACCESS: This access remains amenable to future percutaneous interventions as clinically indicated. Ruthann Cancer, MD [facility] Radiology Electronically Signed   By: Ruthann Cancer MD   On: 12/21/2019 18:11   IR US Guide Vasc Access Right  Result Date: 12/19/2019 INDICATION: 74 year old female referred for a temporary hemodialysis catheter placement EXAM: IMAGE GUIDED PLACEMENT OF TEMPORARY HEMODIALYSIS CATHETER MEDICATIONS: None ANESTHESIA/SEDATION: None FLUOROSCOPY TIME:  Fluoroscopy Time: 0 minutes 24 seconds (1 mGy). COMPLICATIONS: None PROCEDURE: Informed written consent was obtained from the patient and the patient's family after a discussion of the risks, benefits, and alternatives to treatment. Questions regarding the procedure were encouraged and answered. The right neck was prepped with chlorhexidine in a sterile fashion, and a sterile drape was applied  covering the operative field. Maximum barrier sterile technique with sterile gowns and gloves were used for the procedure. A timeout was performed prior to the initiation of the procedure. A micropuncture kit was utilized to access the right internal jugular vein under direct, real-time ultrasound guidance after the overlying soft tissues were anesthetized with 1% lidocaine with epinephrine. Ultrasound image documentation was performed. The microwire was kinked to measure appropriate catheter length. A stiff glidewire was advanced to the level of the IVC. A 20 cm hemodialysis catheter was then placed over the wire. Final catheter positioning was confirmed and documented with a spot radiographic image. The catheter aspirates and flushes normally. The catheter was flushed with appropriate volume heparin dwells. Dressings were applied. The patient tolerated the procedure Bianca without immediate post procedural complication. IMPRESSION: Status post image guided placement of a right IJ temporary hemodialysis catheter. Signed, Dulcy Fanny. Dellia Nims, RPVI [facility] Radiology Electronically Signed   By: Corrie Mckusick D.O.   On: 12/19/2019 16:53   DG Chest Port 1 View  Result Date: 12/19/2019 CLINICAL DATA:  Rib fractures. EXAM: PORTABLE CHEST 1 VIEW COMPARISON:  Chest/rib radiographs 12/17/2019 and CT 12/18/2019 FINDINGS: The cardiomediastinal silhouette is unchanged. Aortic atherosclerosis is noted. 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 No acute airspace consolidation, edema, sizable pleural effusion, or pneumothorax is identified. Multiple right rib fractures are again noted. IMPRESSION: Unchanged appearance of the chest. Electronically Signed   By: Logan Bores M.D.   On: 12/19/2019 10:56   IR THROMBECTOMY AV FISTULA W/THROMBOLYSIS/PTA INC/SHUNT/IMG LEFT  Result Date: 12/21/2019 INDICATION:  74 year old female with history of end-stage renal disease on hemodialysis with clinical suspicion for thrombosis of left upper extremity arteriovenous graft. EXAM: 1. Ultrasound-guided antegrade venous access of left upper extremity arteriovenous graft 2. Pharmacomechanical thrombolysis of left upper extremity arteriovenous graft 3. Ultrasound-guided retrograde venous access of left upper extremity arteriovenous graft 4. Arteriovenous  anastomosis balloon thrombectomy 5. Rheolytic thrombectomy of left upper extremity arteriovenous graft 6. Balloon maceration of left upper extremity arteriovenous graft 7. Completion fistulogram of left upper extremity graft COMPARISON:  None. MEDICATIONS: 4000 units heparin, intravenous CONTRAST:  56mL OMNIPAQUE IOHEXOL 300 MG/ML  SOLN ANESTHESIA/SEDATION: Moderate (conscious) sedation was employed during this procedure. A total of Versed 0.5 mg and Fentanyl 37.5 mcg was administered intravenously. Moderate Sedation Time: 44 minutes. The patient's level of consciousness and vital signs were monitored continuously by radiology nursing throughout the procedure under my direct supervision. FLUOROSCOPY TIME:  6 minutes, 24 seconds, 3 mGy COMPLICATIONS: None immediate. PROCEDURE: Informed written consent was obtained from the patient after a discussion of the risk, benefits and alternatives to treatment. Questions regarding the procedure were encouraged and answered. A timeout was performed prior to the initiation of the procedure. The left arm dialysis graft was prepped with Chlorhexidine in a sterile fashion, and a sterile drape was applied covering the operative field. Preprocedure ultrasound demonstrated complete thrombosis of the visualized portion indwelling brachial axillary graft. Using ultrasound guidance, 21 grade should micropuncture needle was inserted into the graft in antegrade fashion near the arterial anastomosis. A Wholey wire was then directed to the central veins over  which the micropuncture sheath was exchanged for a 6 [place] sheath. A 5 [place] Kumpe the catheter was then directed to the level of the innominate vein. Hand injection of contrast was performed as the catheter was withdrawn to delineate the central aspect of the thrombosed graft which was at the level of the axillary vein. Next, pharmacomechanical thrombolysis was performed with 8 mg tPA admixed into 250 mg of heparinized saline. A total of 100 mg of the solution was administered from the antegrade sheath to the central portion of the occluded axillary vein. The tPA was allowed to dwell. Next, retrograde access was obtained with ultrasound guidance and a 21 gauge needle. A Glidewire was directed into the brachial artery, over which a 6 French sheath was placed. Fogarty balloon thrombectomy was performed with 3 sweeping passes through the arterial anastomosis. Next, rheolytic thrombectomy was performed through the venous outflow from the antegrade sheath. This was tolerated Bianca without physiologic reactive changes. This was followed by balloon maceration with a 7 mm x 4 cm mustang balloon from the peripheral aspect of the antegrade access to the axillary vein. Fistulagram was then performed from the antegrade sheath which demonstrated brisk antegrade flow without flow-limiting stenosis in the outflow portion of the graft. Retrograde access was then obtained into the brachial vein just proximal to the anastomosis with a Glidewire and Kumpe the catheter. Hand injection through the Kumpe the catheter demonstrated patent graft with brisk outflow. The sheaths were removed and hemostasis obtained with application of a 2-0 Prolene pursestring suture which will be removed at the patient's next dialysis session. A dressing was placed. The patient tolerated the procedure Bianca without immediate postprocedural complication. FINDINGS: Occluded indwelling brachial axillary graft. Successful declot as described above. Widely  patent graft after declot. IMPRESSION: 1. Occluded indwelling left brachial artery to axillary vein dialysis graft. 2. Successful pharmacomechanical thrombolysis, arterial balloon thrombectomy, rheolytic thrombectomy, balloon maceration for recanalization of the left upper extremity graft. ACCESS: This access remains amenable to future percutaneous interventions as clinically indicated. Ruthann Cancer, MD [facility] Radiology Electronically Signed   By: Ruthann Cancer MD   On: 12/21/2019 18:11   IR TUNNELED CENTRAL VENOUS CATHETER PLACEMENT  Result Date: 12/19/2019 INDICATION: 74 year old female referred  for a temporary hemodialysis catheter placement EXAM: IMAGE GUIDED PLACEMENT OF TEMPORARY HEMODIALYSIS CATHETER MEDICATIONS: None ANESTHESIA/SEDATION: None FLUOROSCOPY TIME:  Fluoroscopy Time: 0 minutes 24 seconds (1 mGy). COMPLICATIONS: None PROCEDURE: Informed written consent was obtained from the patient and the patient's family after a discussion of the risks, benefits, and alternatives to treatment. Questions regarding the procedure were encouraged and answered. The right neck was prepped with chlorhexidine in a sterile fashion, and a sterile drape was applied covering the operative field. Maximum barrier sterile technique with sterile gowns and gloves were used for the procedure. A timeout was performed prior to the initiation of the procedure. A micropuncture kit was utilized to access the right internal jugular vein under direct, real-time ultrasound guidance after the overlying soft tissues were anesthetized with 1% lidocaine with epinephrine. Ultrasound image documentation was performed. The microwire was kinked to measure appropriate catheter length. A stiff glidewire was advanced to the level of the IVC. A 20 cm hemodialysis catheter was then placed over the wire. Final catheter positioning was confirmed and documented with a spot radiographic image.  The catheter aspirates and flushes normally. The catheter was flushed with appropriate volume heparin dwells. Dressings were applied. The patient tolerated the procedure Bianca without immediate post procedural complication. IMPRESSION: Status post image guided placement of a right IJ temporary hemodialysis catheter. Signed, Dulcy Fanny. Dellia Nims, RPVI [facility] Radiology Electronically Signed   By: Corrie Mckusick D.O.   On: 12/19/2019 16:53    Discharge Instructions: Discharge Instructions     Diet - low sodium heart healthy   Complete by: As directed    Increase activity slowly   Complete by: As directed    Increase activity slowly   Complete by: As directed        Signed: Freida Busman, MD 12/28/2019, 11:23 AM   Pager: [phone]

## 2020-03-15 ENCOUNTER — Telehealth: Payer: Self-pay | Admitting: Orthopaedic Surgery

## 2020-03-15 NOTE — Telephone Encounter (Signed)
Okay to give patient Amox before procedure?

## 2020-03-15 NOTE — Telephone Encounter (Signed)
Yes 2 g thanks.

## 2020-03-15 NOTE — Telephone Encounter (Signed)
titanium

## 2020-03-15 NOTE — Telephone Encounter (Signed)
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  She is having dental work Feb 3rd and she needs some antibiotics.

## 2020-03-15 NOTE — Telephone Encounter (Signed)
Pts states her Dentist would like to know what material was used in her hip.   Please advise thanks

## 2020-03-16 ENCOUNTER — Other Ambulatory Visit: Payer: Self-pay

## 2020-03-19 MED ORDER — AMOXICILLIN 500 MG PO TABS: ORAL_TABLET | ORAL | 0 refills | Status: DC

## 2020-03-19 NOTE — Addendum Note (Signed)
Addended by: Laurann Montana on: 03/19/2020 12:20 PM   Modules accepted: Orders

## 2020-03-19 NOTE — Telephone Encounter (Signed)
Medication submitted.

## 2021-03-15 ENCOUNTER — Emergency Department (HOSPITAL_COMMUNITY): Payer: Medicare HMO

## 2021-03-15 ENCOUNTER — Inpatient Hospital Stay (HOSPITAL_COMMUNITY)
Admission: EM | Admit: 2021-03-15 | Discharge: 2021-03-23 | DRG: 193 | Disposition: A | Discharge status: Home / Self Care | Payer: Medicare HMO | Attending: Family Medicine | Admitting: Family Medicine

## 2021-03-15 ENCOUNTER — Other Ambulatory Visit: Payer: Self-pay

## 2021-03-15 ENCOUNTER — Encounter (HOSPITAL_COMMUNITY): Payer: Self-pay | Admitting: Internal Medicine

## 2021-03-15 DIAGNOSIS — Z531 Procedure and treatment not carried out because of patient's decision for reasons of belief and group pressure: Secondary | ICD-10-CM | POA: Diagnosis present

## 2021-03-15 DIAGNOSIS — J180 Bronchopneumonia, unspecified organism: Principal | ICD-10-CM | POA: Diagnosis present

## 2021-03-15 DIAGNOSIS — N2581 Secondary hyperparathyroidism of renal origin: Secondary | ICD-10-CM | POA: Diagnosis present

## 2021-03-15 DIAGNOSIS — Z96642 Presence of left artificial hip joint: Secondary | ICD-10-CM | POA: Diagnosis present

## 2021-03-15 DIAGNOSIS — E785 Hyperlipidemia, unspecified: Secondary | ICD-10-CM | POA: Diagnosis present

## 2021-03-15 DIAGNOSIS — Z20822 Contact with and (suspected) exposure to covid-19: Secondary | ICD-10-CM | POA: Diagnosis present

## 2021-03-15 DIAGNOSIS — G9340 Encephalopathy, unspecified: Secondary | ICD-10-CM

## 2021-03-15 DIAGNOSIS — Z79899 Other long term (current) drug therapy: Secondary | ICD-10-CM | POA: Diagnosis not present

## 2021-03-15 DIAGNOSIS — J40 Bronchitis, not specified as acute or chronic: Secondary | ICD-10-CM | POA: Diagnosis present

## 2021-03-15 DIAGNOSIS — S2232XA Fracture of one rib, left side, initial encounter for closed fracture: Secondary | ICD-10-CM | POA: Diagnosis present

## 2021-03-15 DIAGNOSIS — W19XXXA Unspecified fall, initial encounter: Secondary | ICD-10-CM | POA: Diagnosis present

## 2021-03-15 DIAGNOSIS — Y92009 Unspecified place in unspecified non-institutional (private) residence as the place of occurrence of the external cause: Secondary | ICD-10-CM

## 2021-03-15 DIAGNOSIS — R451 Restlessness and agitation: Secondary | ICD-10-CM

## 2021-03-15 DIAGNOSIS — I10 Essential (primary) hypertension: Secondary | ICD-10-CM | POA: Diagnosis present

## 2021-03-15 DIAGNOSIS — F22 Delusional disorders: Secondary | ICD-10-CM | POA: Diagnosis present

## 2021-03-15 DIAGNOSIS — I120 Hypertensive chronic kidney disease with stage 5 chronic kidney disease or end stage renal disease: Secondary | ICD-10-CM | POA: Diagnosis present

## 2021-03-15 DIAGNOSIS — F32A Depression, unspecified: Secondary | ICD-10-CM | POA: Diagnosis present

## 2021-03-15 DIAGNOSIS — Z992 Dependence on renal dialysis: Secondary | ICD-10-CM

## 2021-03-15 DIAGNOSIS — N186 End stage renal disease: Secondary | ICD-10-CM | POA: Diagnosis present

## 2021-03-15 DIAGNOSIS — Z87891 Personal history of nicotine dependence: Secondary | ICD-10-CM

## 2021-03-15 DIAGNOSIS — R4182 Altered mental status, unspecified: Secondary | ICD-10-CM | POA: Diagnosis present

## 2021-03-15 DIAGNOSIS — R531 Weakness: Secondary | ICD-10-CM

## 2021-03-15 DIAGNOSIS — D631 Anemia in chronic kidney disease: Secondary | ICD-10-CM | POA: Diagnosis present

## 2021-03-15 DIAGNOSIS — E119 Type 2 diabetes mellitus without complications: Secondary | ICD-10-CM

## 2021-03-15 DIAGNOSIS — G9341 Metabolic encephalopathy: Secondary | ICD-10-CM | POA: Diagnosis present

## 2021-03-15 DIAGNOSIS — M109 Gout, unspecified: Secondary | ICD-10-CM | POA: Diagnosis present

## 2021-03-15 DIAGNOSIS — R32 Unspecified urinary incontinence: Secondary | ICD-10-CM | POA: Diagnosis present

## 2021-03-15 DIAGNOSIS — R0902 Hypoxemia: Secondary | ICD-10-CM | POA: Diagnosis present

## 2021-03-15 DIAGNOSIS — E1122 Type 2 diabetes mellitus with diabetic chronic kidney disease: Secondary | ICD-10-CM | POA: Diagnosis present

## 2021-03-15 DIAGNOSIS — R443 Hallucinations, unspecified: Secondary | ICD-10-CM | POA: Diagnosis present

## 2021-03-15 DIAGNOSIS — R159 Full incontinence of feces: Secondary | ICD-10-CM

## 2021-03-15 DIAGNOSIS — J189 Pneumonia, unspecified organism: Secondary | ICD-10-CM | POA: Diagnosis present

## 2021-03-15 DIAGNOSIS — R0602 Shortness of breath: Secondary | ICD-10-CM

## 2021-03-15 DIAGNOSIS — R197 Diarrhea, unspecified: Secondary | ICD-10-CM

## 2021-03-15 HISTORY — DX: Hyperlipidemia, unspecified: E78.5

## 2021-03-15 HISTORY — DX: Dependence on renal dialysis: N18.6

## 2021-03-15 HISTORY — DX: Procedure and treatment not carried out because of patient's decision for reasons of belief and group pressure: Z53.1

## 2021-03-15 HISTORY — DX: Reserved for inherently not codable concepts without codable children: IMO0001

## 2021-03-15 LAB — COMPREHENSIVE METABOLIC PANEL
ALT: 13 U/L (ref 0–44)
AST: 19 U/L (ref 15–41)
Albumin: 3.9 g/dL (ref 3.5–5.0)
Alkaline Phosphatase: 72 U/L (ref 38–126)
Anion gap: 25 — ABNORMAL HIGH (ref 5–15)
BUN: 82 mg/dL — ABNORMAL HIGH (ref 8–23)
CO2: 20 mmol/L — ABNORMAL LOW (ref 22–32)
Calcium: 9.4 mg/dL (ref 8.9–10.3)
Chloride: 89 mmol/L — ABNORMAL LOW (ref 98–111)
Creatinine, Ser: 12.97 mg/dL — ABNORMAL HIGH (ref 0.44–1.00)
GFR, Estimated: 3 mL/min — ABNORMAL LOW (ref >60)
Glucose, Bld: 185 mg/dL — ABNORMAL HIGH (ref 70–99)
Potassium: 6.4 mmol/L (ref 3.5–5.1)
Sodium: 134 mmol/L — ABNORMAL LOW (ref 135–145)
Total Bilirubin: 3.7 mg/dL — ABNORMAL HIGH (ref 0.3–1.2)
Total Protein: 7.8 g/dL (ref 6.5–8.1)

## 2021-03-15 LAB — CBC WITH DIFFERENTIAL/PLATELET
Abs Immature Granulocytes: 0.03 10*3/uL (ref 0.00–0.07)
Basophils Absolute: 0.1 10*3/uL (ref 0.0–0.1)
Basophils Relative: 1 %
Eosinophils Absolute: 0.1 10*3/uL (ref 0.0–0.5)
Eosinophils Relative: 1 %
HCT: 40 % (ref 36.0–46.0)
Hemoglobin: 13 g/dL (ref 12.0–15.0)
Immature Granulocytes: 0 %
Lymphocytes Relative: 12 %
Lymphs Abs: 1.2 10*3/uL (ref 0.7–4.0)
MCH: 29.8 pg (ref 26.0–34.0)
MCHC: 32.5 g/dL (ref 30.0–36.0)
MCV: 91.7 fL (ref 80.0–100.0)
Monocytes Absolute: 0.4 10*3/uL (ref 0.1–1.0)
Monocytes Relative: 4 %
Neutro Abs: 7.9 10*3/uL — ABNORMAL HIGH (ref 1.7–7.7)
Neutrophils Relative %: 82 %
Platelets: 293 10*3/uL (ref 150–400)
RBC: 4.36 MIL/uL (ref 3.87–5.11)
RDW: 18.2 % — ABNORMAL HIGH (ref 11.5–15.5)
WBC: 9.6 10*3/uL (ref 4.0–10.5)
nRBC: 0 % (ref 0.0–0.2)

## 2021-03-15 LAB — I-STAT CHEM 8, ED
BUN: 74 mg/dL — ABNORMAL HIGH (ref 8–23)
Calcium, Ion: 1.02 mmol/L — ABNORMAL LOW (ref 1.15–1.40)
Chloride: 98 mmol/L (ref 98–111)
Creatinine, Ser: 14 mg/dL — ABNORMAL HIGH (ref 0.44–1.00)
Glucose, Bld: 184 mg/dL — ABNORMAL HIGH (ref 70–99)
HCT: 44 % (ref 36.0–46.0)
Hemoglobin: 15 g/dL (ref 12.0–15.0)
Potassium: 6.2 mmol/L — ABNORMAL HIGH (ref 3.5–5.1)
Sodium: 131 mmol/L — ABNORMAL LOW (ref 135–145)
TCO2: 20 mmol/L — ABNORMAL LOW (ref 22–32)

## 2021-03-15 LAB — CBG MONITORING, ED
Glucose-Capillary: 153 mg/dL — ABNORMAL HIGH (ref 70–99)
Glucose-Capillary: 106 mg/dL — ABNORMAL HIGH (ref 70–99)

## 2021-03-15 LAB — MRSA NEXT GEN BY PCR, NASAL: MRSA by PCR Next Gen: NOT DETECTED

## 2021-03-15 LAB — BLOOD GAS, ARTERIAL
Acid-base deficit: 7.6 mmol/L — ABNORMAL HIGH (ref 0.0–2.0)
Bicarbonate: 17.7 mmol/L — ABNORMAL LOW (ref 20.0–28.0)
Drawn by: 53547
FIO2: 80
O2 Saturation: 99.5 %
Patient temperature: 36.2
pCO2 arterial: 36.5 mmHg (ref 32.0–48.0)
pH, Arterial: 7.302 — ABNORMAL LOW (ref 7.350–7.450)
pO2, Arterial: 290 mmHg — ABNORMAL HIGH (ref 83.0–108.0)

## 2021-03-15 LAB — HEPATITIS B SURFACE ANTIBODY,QUALITATIVE: Hep B S Ab: REACTIVE — AB

## 2021-03-15 LAB — LACTIC ACID, PLASMA: Lactic Acid, Venous: 1.4 mmol/L (ref 0.5–1.9)

## 2021-03-15 LAB — GLUCOSE, CAPILLARY: Glucose-Capillary: 132 mg/dL — ABNORMAL HIGH (ref 70–99)

## 2021-03-15 LAB — RESP PANEL BY RT-PCR (FLU A&B, COVID) ARPGX2
Influenza A by PCR: NEGATIVE
Influenza B by PCR: NEGATIVE
SARS Coronavirus 2 by RT PCR: NEGATIVE

## 2021-03-15 LAB — AMMONIA: Ammonia: 13 umol/L (ref 9–35)

## 2021-03-15 LAB — HEPATITIS B SURFACE ANTIGEN: Hepatitis B Surface Ag: NONREACTIVE

## 2021-03-15 LAB — HEMOGLOBIN A1C
Hgb A1c MFr Bld: 5.2 % (ref 4.8–5.6)
Mean Plasma Glucose: 102.54 mg/dL

## 2021-03-15 IMAGING — DX DG CHEST 1V PORT
1 series · 1 of 1 positions shown · non-contrast
Comparison: Chest x-ray [DATE].

CLINICAL DATA: 75-year-old female with history of altered mental
status.

EXAM:
PORTABLE CHEST 1 VIEW

[chest]
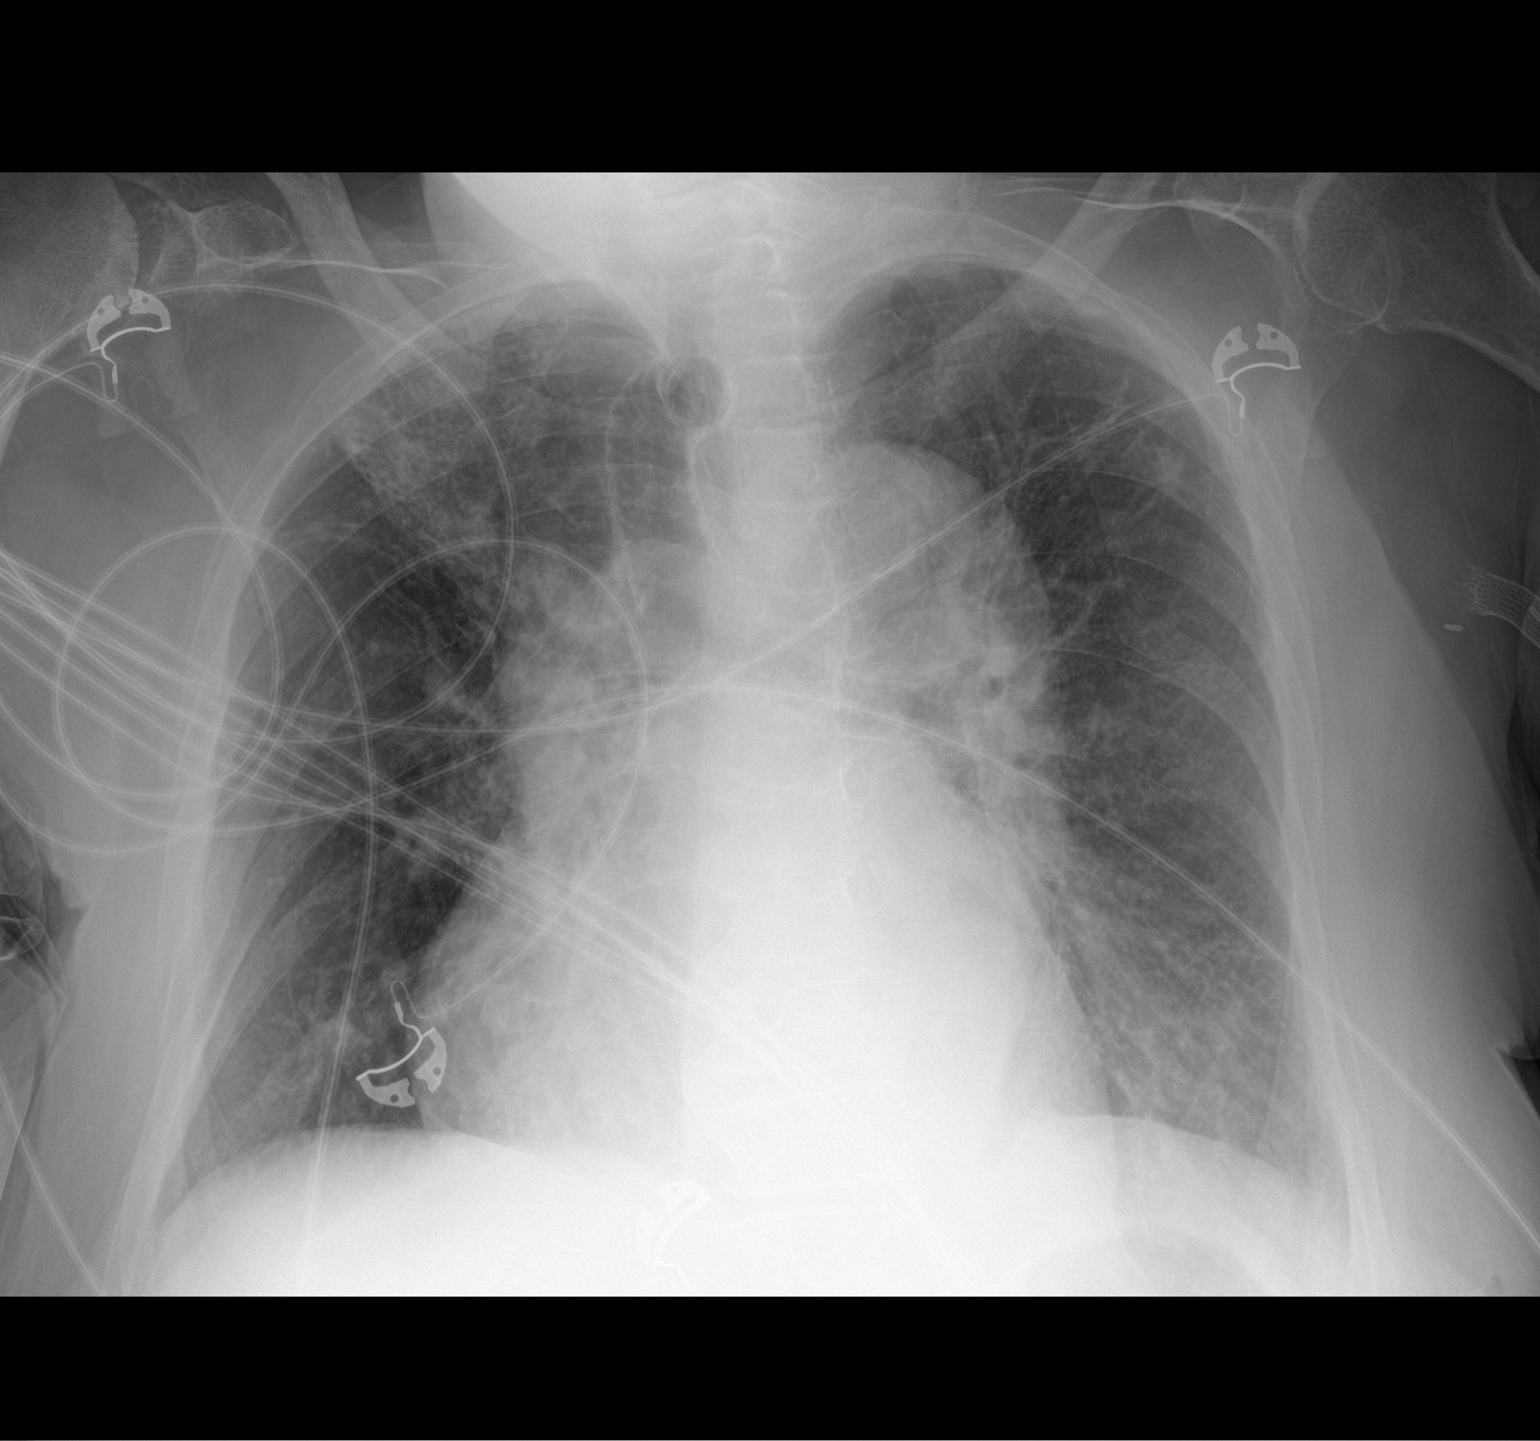

[1 of 1 positions shown; findings below may reference images not displayed]

FINDINGS: Again noted are widespread areas of interstitial prominence and
chronic regions of architectural distortion, most evident throughout
the mid to upper lungs bilaterally, similar to prior examinations.
However, there is increasing conspicuity of a nodular area of
architectural distortion in the periphery of the left upper lobe
estimated to measure 1.1 x 0.8 cm on today's examination. Diffuse
peribronchial cuffing. Patchy ill-defined opacities are also
increasingly conspicuous, particularly in the left mid to lower
lung, concerning for potential developing bronchopneumonia. No
pleural effusions. No pneumothorax. No evidence of pulmonary edema.
Heart size is mildly enlarged. The patient is rotated to the right
on today's exam, resulting in distortion of the mediastinal contours
and reduced diagnostic sensitivity and specificity for mediastinal
pathology.
IMPRESSION: 1. Findings are concerning for bronchitis and developing
bronchopneumonia in the left lung base.
2. Increasingly conspicuous nodular density in the periphery of the
left upper lobe. If there is clinical concern for neoplasm, further
evaluation with noncontrast chest CT would be recommended at this
time. At the very least, repeat standing PA and lateral chest
radiograph would be recommended in 2 months to ensure the stability
or regression of this finding.
3. Chronic scarring in the lungs bilaterally, most evident
throughout the mid to upper lungs, similar to prior examinations.
4. Mild cardiomegaly.

## 2021-03-15 IMAGING — CT CT HEAD W/O CM
4 series · 14 of 47 positions shown, 16 images · non-contrast
Comparison: Head CT [DATE].

CLINICAL DATA: 75-year-old female with history of mental status
change.



[Series 3: head bone · axial · 0.39mm/px · z∈[+873,+889]mm · 2 of 81 slices shown]
[im 8/81  bone]
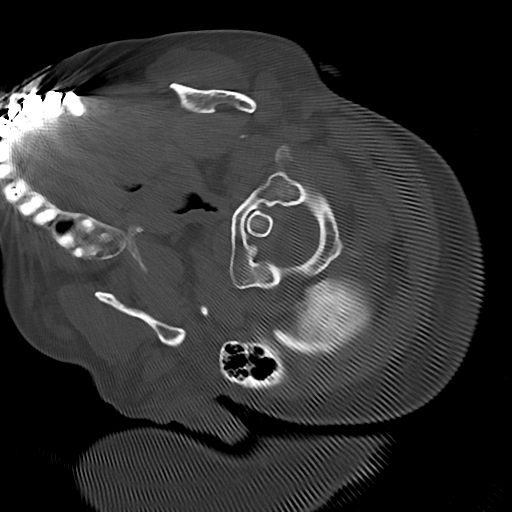
[im 16/81  bone]
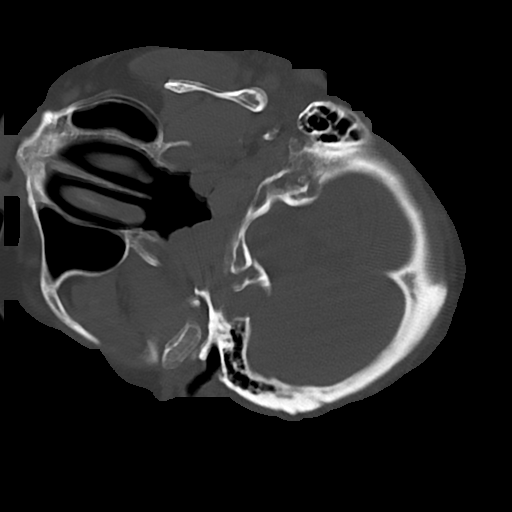

[Series 4: head wo · axial · 0.33mm/px · z∈[+909,+1005]mm · 6 of 29 slices shown, 8 images]
[im 5/29  brain]
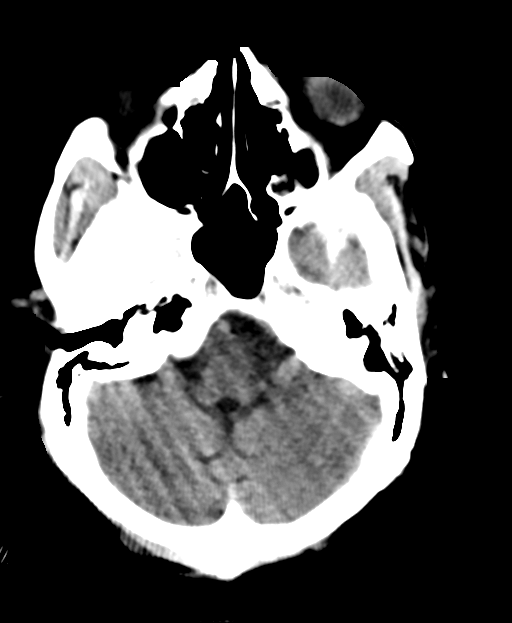
[im 5/29  bone]
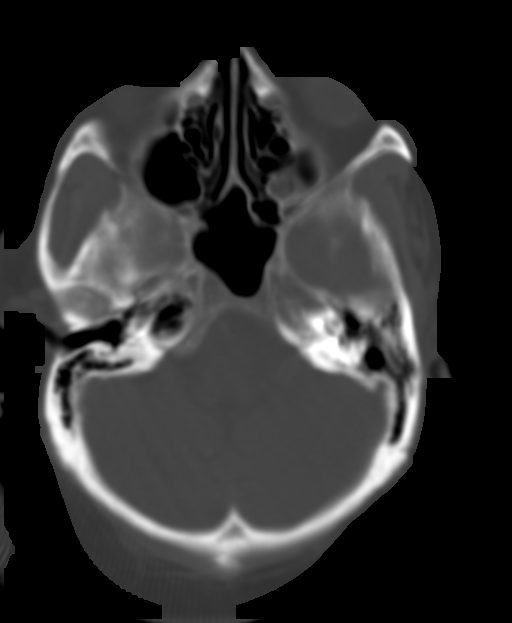
[im 9/29  brain]
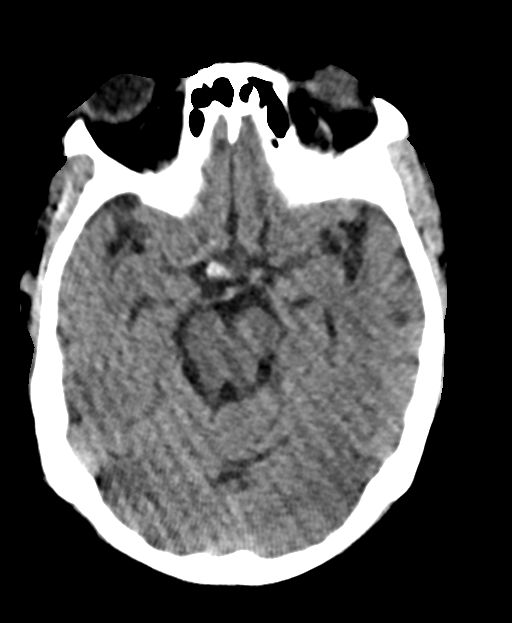
[im 13/29  brain]
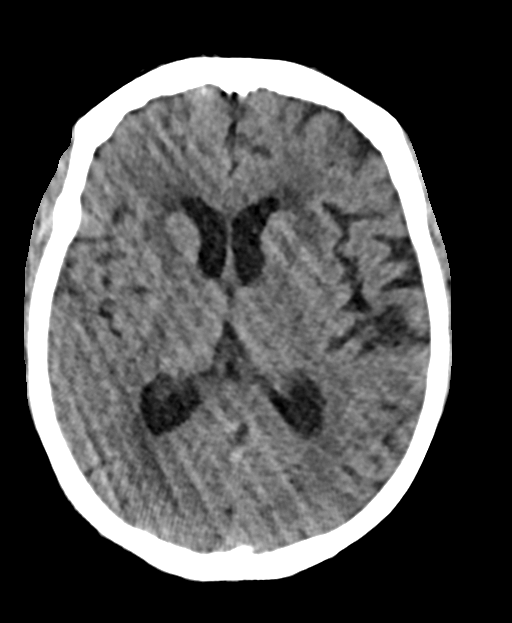
[im 17/29  brain]
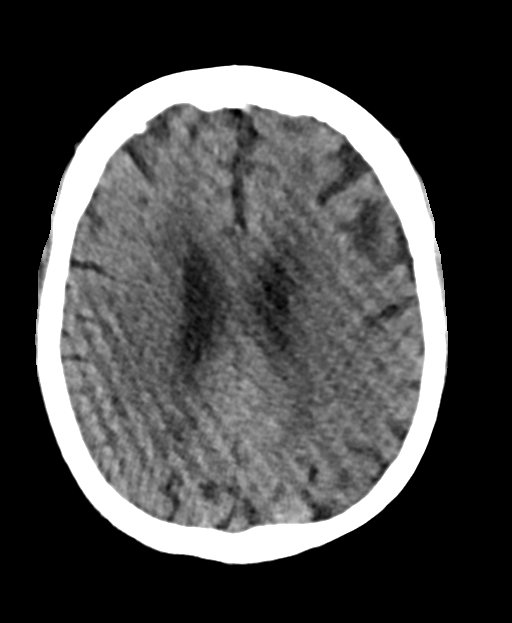
[im 21/29  brain]
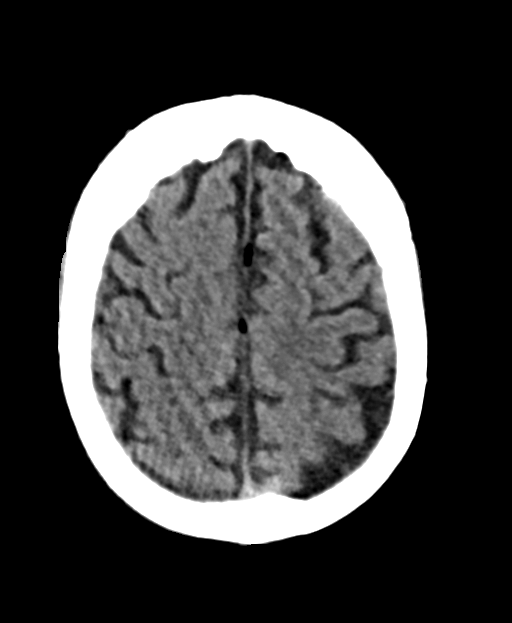
[im 21/29  bone]
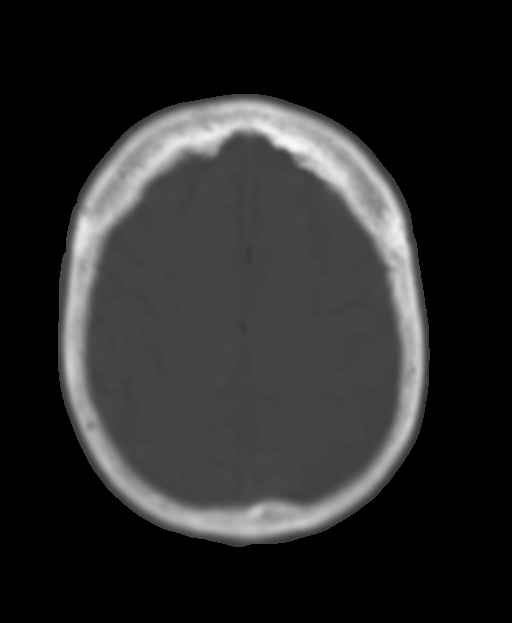
[im 25/29  brain]
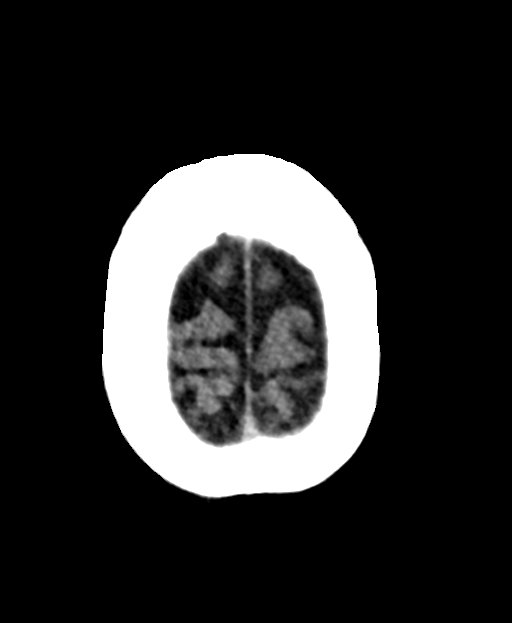

[Series 5: cor soft · coronal · 0.28mm/px · 3 of 70 slices shown]
[im 24/70  brain]
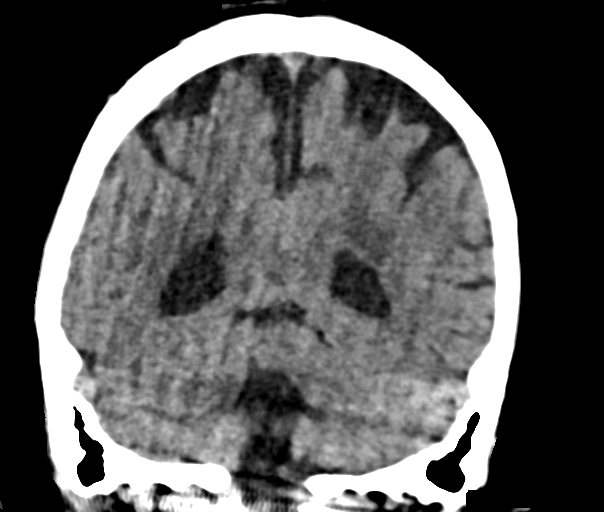
[im 31/70  brain]
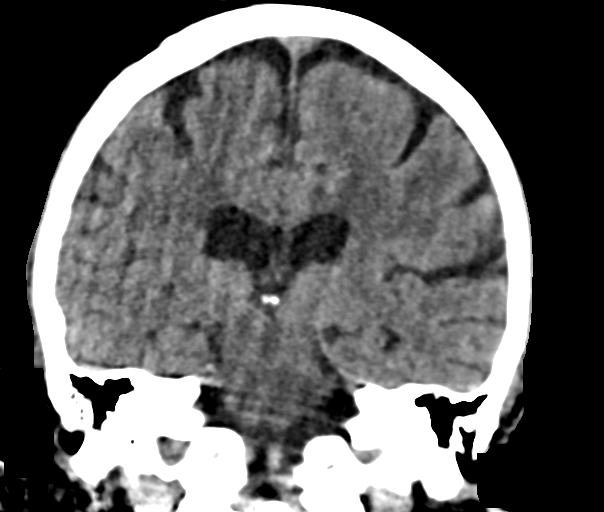
[im 39/70  brain]
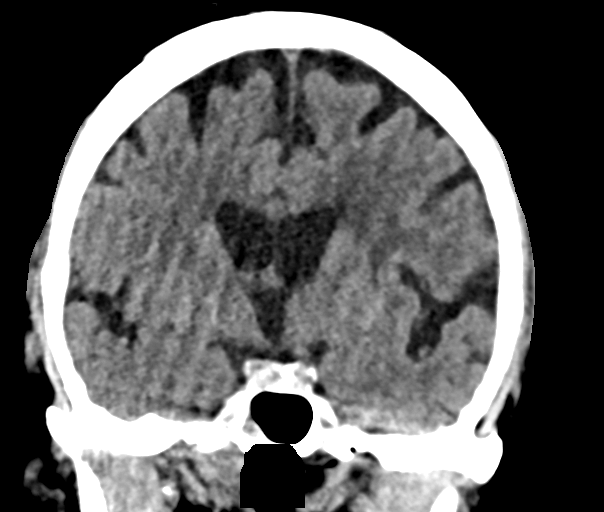

[Series 6: sag soft · sagittal · 0.28mm/px · 3 of 57 slices shown]
[im 23/57  brain]
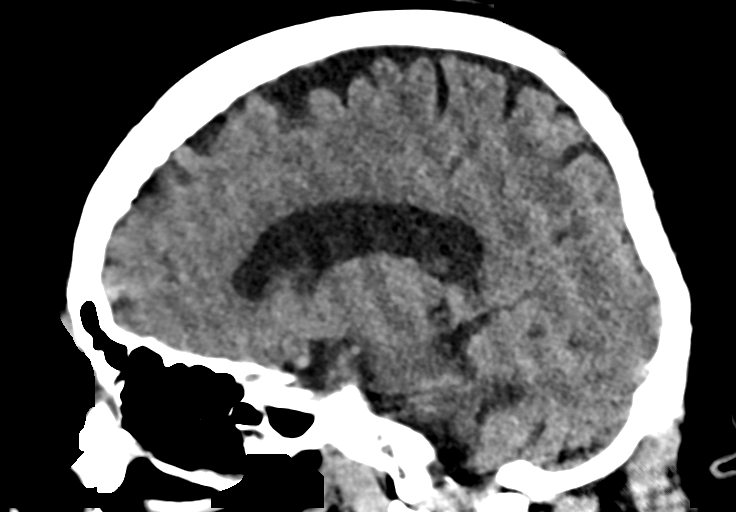
[im 29/57  brain]
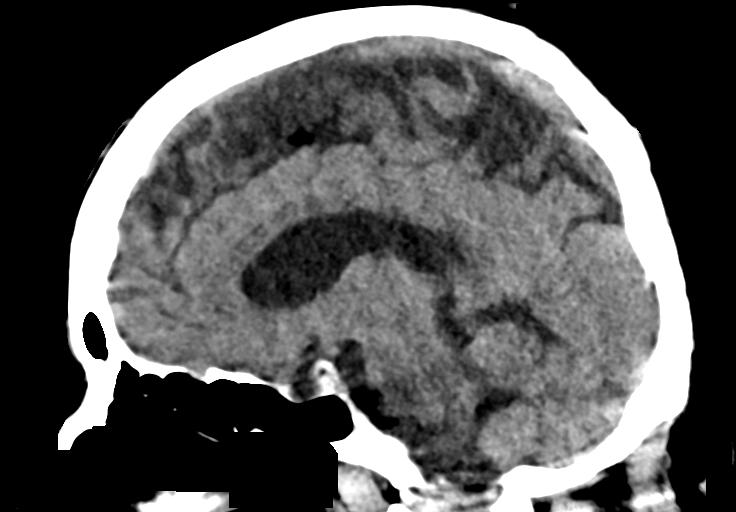
[im 34/57  brain]
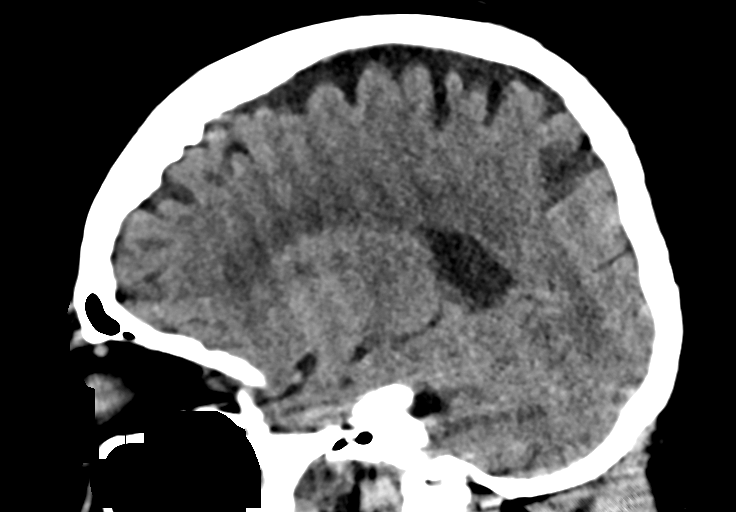

[14 of 47 positions shown; findings below may reference images not displayed]

FINDINGS: Brain: Mild cerebral atrophy. Patchy and confluent areas of
decreased attenuation are noted throughout the deep and
periventricular white matter of the cerebral hemispheres
bilaterally, compatible with chronic microvascular ischemic disease.
No evidence of acute infarction, hemorrhage, hydrocephalus,
extra-axial collection or mass lesion/mass effect.

Vascular: No hyperdense vessel or unexpected calcification.

Skull: Normal. Negative for fracture or focal lesion.

Sinuses/Orbits: No acute finding.

Other: None.
IMPRESSION: 1. No acute intracranial abnormalities.
2. Mild cerebral atrophy with extensive chronic microvascular
ischemic changes in the cerebral white matter, as above.

## 2021-03-15 MED ORDER — PIPERACILLIN-TAZOBACTAM 3.375 G IVPB 30 MIN
3.3750 g | Freq: Once | INTRAVENOUS | Status: AC
  Administered 2021-03-15: 3.375 g via INTRAVENOUS
  Filled 2021-03-15: qty 50

## 2021-03-15 MED ORDER — ONDANSETRON HCL 4 MG PO TABS: 4.0000 mg | ORAL_TABLET | Freq: Four times a day (QID) | ORAL | Status: DC | PRN

## 2021-03-15 MED ORDER — ACETAMINOPHEN 650 MG RE SUPP: 650.0000 mg | Freq: Four times a day (QID) | RECTAL | Status: DC | PRN

## 2021-03-15 MED ORDER — HYDRALAZINE HCL 25 MG PO TABS
25.0000 mg | ORAL_TABLET | Freq: Three times a day (TID) | ORAL | Status: DC
  Administered 2021-03-15 – 2021-03-19 (×12): 25 mg via ORAL
  Filled 2021-03-15 (×13): qty 1

## 2021-03-15 MED ORDER — HYDRALAZINE HCL 20 MG/ML IJ SOLN
5.0000 mg | INTRAMUSCULAR | Status: DC | PRN
  Administered 2021-03-15: 5 mg via INTRAVENOUS
  Filled 2021-03-15: qty 1

## 2021-03-15 MED ORDER — SODIUM CHLORIDE 0.9% FLUSH
3.0000 mL | Freq: Two times a day (BID) | INTRAVENOUS | Status: DC
  Administered 2021-03-15 – 2021-03-23 (×13): 3 mL via INTRAVENOUS

## 2021-03-15 MED ORDER — VANCOMYCIN HCL 1500 MG/300ML IV SOLN
1500.0000 mg | Freq: Once | INTRAVENOUS | Status: AC
  Administered 2021-03-15: 1500 mg via INTRAVENOUS
  Filled 2021-03-15: qty 300

## 2021-03-15 MED ORDER — LIDOCAINE-PRILOCAINE 2.5-2.5 % EX CREA
1.0000 "application " | TOPICAL_CREAM | CUTANEOUS | Status: DC | PRN
  Filled 2021-03-15: qty 5

## 2021-03-15 MED ORDER — SODIUM CHLORIDE 0.9 % IV SOLN: 100.0000 mL | INTRAVENOUS | Status: DC | PRN

## 2021-03-15 MED ORDER — HEPARIN SODIUM (PORCINE) 1000 UNIT/ML DIALYSIS
1000.0000 [IU] | INTRAMUSCULAR | Status: DC | PRN
  Filled 2021-03-15: qty 1

## 2021-03-15 MED ORDER — DOCUSATE SODIUM 283 MG RE ENEM
1.0000 | ENEMA | RECTAL | Status: DC | PRN
Start: 2021-03-15 — End: 2021-03-24
  Filled 2021-03-15: qty 1

## 2021-03-15 MED ORDER — LIDOCAINE HCL (PF) 1 % IJ SOLN: 5.0000 mL | INTRAMUSCULAR | Status: DC | PRN

## 2021-03-15 MED ORDER — PENTAFLUOROPROP-TETRAFLUOROETH EX AERO
1.0000 "application " | INHALATION_SPRAY | CUTANEOUS | Status: DC | PRN
  Filled 2021-03-15: qty 116

## 2021-03-15 MED ORDER — SODIUM CHLORIDE 0.9 % IV SOLN
2.0000 g | INTRAVENOUS | Status: AC
  Administered 2021-03-15 – 2021-03-19 (×5): 2 g via INTRAVENOUS
  Filled 2021-03-15 (×5): qty 20

## 2021-03-15 MED ORDER — CAMPHOR-MENTHOL 0.5-0.5 % EX LOTN: 1.0000 "application " | TOPICAL_LOTION | Freq: Three times a day (TID) | CUTANEOUS | Status: DC | PRN

## 2021-03-15 MED ORDER — OXYCODONE HCL 5 MG PO TABS
5.0000 mg | ORAL_TABLET | ORAL | Status: DC | PRN
  Administered 2021-03-23: 5 mg via ORAL
  Filled 2021-03-15: qty 1

## 2021-03-15 MED ORDER — ONDANSETRON HCL 4 MG/2ML IJ SOLN
4.0000 mg | Freq: Four times a day (QID) | INTRAMUSCULAR | Status: DC | PRN
  Administered 2021-03-17: 4 mg via INTRAVENOUS
  Filled 2021-03-15: qty 2

## 2021-03-15 MED ORDER — AMLODIPINE BESYLATE 5 MG PO TABS
5.0000 mg | ORAL_TABLET | Freq: Every day | ORAL | Status: DC
  Administered 2021-03-16 – 2021-03-17 (×2): 5 mg via ORAL
  Filled 2021-03-15 (×2): qty 1

## 2021-03-15 MED ORDER — ACETAMINOPHEN 325 MG PO TABS
650.0000 mg | ORAL_TABLET | Freq: Four times a day (QID) | ORAL | Status: DC | PRN
  Administered 2021-03-16 – 2021-03-23 (×3): 650 mg via ORAL
  Filled 2021-03-15 (×3): qty 2

## 2021-03-15 MED ORDER — SORBITOL 70 % SOLN
30.0000 mL | Status: DC | PRN
  Filled 2021-03-15: qty 30

## 2021-03-15 MED ORDER — HEPARIN SODIUM (PORCINE) 5000 UNIT/ML IJ SOLN
5000.0000 [IU] | Freq: Three times a day (TID) | INTRAMUSCULAR | Status: DC
  Administered 2021-03-15 – 2021-03-23 (×23): 5000 [IU] via SUBCUTANEOUS
  Filled 2021-03-15 (×23): qty 1

## 2021-03-15 MED ORDER — RENA-VITE PO TABS
1.0000 | ORAL_TABLET | Freq: Every day | ORAL | Status: DC
  Administered 2021-03-15 – 2021-03-23 (×8): 1 via ORAL
  Filled 2021-03-15 (×10): qty 1

## 2021-03-15 MED ORDER — LABETALOL HCL 200 MG PO TABS
200.0000 mg | ORAL_TABLET | Freq: Two times a day (BID) | ORAL | Status: DC
  Administered 2021-03-15 – 2021-03-23 (×12): 200 mg via ORAL
  Filled 2021-03-15 (×14): qty 1

## 2021-03-15 MED ORDER — SODIUM CHLORIDE 0.9 % IV SOLN
500.0000 mg | INTRAVENOUS | Status: DC
  Administered 2021-03-15: 500 mg via INTRAVENOUS
  Filled 2021-03-15 (×2): qty 5

## 2021-03-15 MED ORDER — NEPRO/CARBSTEADY PO LIQD
237.0000 mL | Freq: Three times a day (TID) | ORAL | Status: DC | PRN
  Filled 2021-03-15: qty 237

## 2021-03-15 MED ORDER — INSULIN ASPART 100 UNIT/ML IJ SOLN
0.0000 [IU] | Freq: Three times a day (TID) | INTRAMUSCULAR | Status: DC
  Administered 2021-03-18 – 2021-03-20 (×2): 1 [IU] via SUBCUTANEOUS

## 2021-03-15 MED ORDER — DOCUSATE SODIUM 100 MG PO CAPS
100.0000 mg | ORAL_CAPSULE | Freq: Two times a day (BID) | ORAL | Status: DC
  Administered 2021-03-15 – 2021-03-23 (×9): 100 mg via ORAL
  Filled 2021-03-15 (×12): qty 1

## 2021-03-15 MED ORDER — CALCIUM CARBONATE ANTACID 1250 MG/5ML PO SUSP
500.0000 mg | Freq: Four times a day (QID) | ORAL | Status: DC | PRN
  Administered 2021-03-18: 500 mg via ORAL
  Filled 2021-03-15 (×3): qty 5

## 2021-03-15 MED ORDER — ZOLPIDEM TARTRATE 5 MG PO TABS
5.0000 mg | ORAL_TABLET | Freq: Every evening | ORAL | Status: DC | PRN
  Administered 2021-03-16 – 2021-03-23 (×8): 5 mg via ORAL
  Filled 2021-03-15 (×8): qty 1

## 2021-03-15 MED ORDER — ATORVASTATIN CALCIUM 10 MG PO TABS
10.0000 mg | ORAL_TABLET | Freq: Every evening | ORAL | Status: DC
  Administered 2021-03-16 – 2021-03-22 (×6): 10 mg via ORAL
  Filled 2021-03-15 (×5): qty 1

## 2021-03-15 MED ORDER — CHLORHEXIDINE GLUCONATE CLOTH 2 % EX PADS
6.0000 | MEDICATED_PAD | Freq: Every day | CUTANEOUS | Status: DC
  Administered 2021-03-16 – 2021-03-22 (×6): 6 via TOPICAL

## 2021-03-15 MED ORDER — HYDROXYZINE HCL 25 MG PO TABS: 25.0000 mg | ORAL_TABLET | Freq: Three times a day (TID) | ORAL | Status: DC | PRN

## 2021-03-15 MED ORDER — CALCITRIOL 0.5 MCG PO CAPS
1.5000 ug | ORAL_CAPSULE | ORAL | Status: DC
  Administered 2021-03-18 – 2021-03-22 (×3): 1.5 ug via ORAL
  Filled 2021-03-15 (×3): qty 3

## 2021-03-15 NOTE — Progress Notes (Signed)
°   03/15/21 1845  Assess: MEWS Score  Temp 98.9 F (37.2 C)  BP (!) 222/193  Pulse Rate 83  Resp 19  SpO2 100 %  Assess: MEWS Score  MEWS Temp 0  MEWS Systolic 2  MEWS Pulse 0  MEWS RR 0  MEWS LOC 0  MEWS Score 2  MEWS Score Color Yellow  Assess: if the MEWS score is Yellow or Red  Were vital signs taken at a resting state? Yes  Focused Assessment No change from prior assessment  Early Detection of Sepsis Score *See Row Information* Low  MEWS guidelines implemented *See Row Information* Yes  Treat  MEWS Interventions Escalated (See documentation below)  Take Vital Signs  Increase Vital Sign Frequency  Yellow: Q 2hr X 2 then Q 4hr X 2, if remains yellow, continue Q 4hrs  Escalate  MEWS: Escalate Yellow: discuss with charge nurse/RN and consider discussing with provider and RRT  Notify: Charge Nurse/RN  Name of Charge Nurse/RN Notified Tharon Bomar A., RN  Date Charge Nurse/RN Notified 03/15/21  Time Charge Nurse/RN Notified 1851  Document  Patient Outcome Other (Comment) (prn blood pressure medicaiton given)

## 2021-03-15 NOTE — H&P (Signed)
History and Physical    Patient: Bianca Myers CNO:[id number] DOB: [birth year] DOA: 03/15/2021 DOS: the patient was seen and examined on 03/15/2021 PCP: [name], MD  Patient coming from: Home - lives with son; Bianca Myers: Bianca Myers, [phone]   Chief Complaint: AMS  HPI: Bianca Myers is a 76 y.o. female with medical history significant of ESRD on HD; DM; HTN; and depression presenting with AMS.  She had been treated for PNA and then was diagnosed with shingles and started on medication.  Since then, she has had "odd mannerisms" and incoherent speech, progressively worsening.   Last Tuesday, she had a consistent cough.  They went to HD Wednesday and saw her PCP.  He thought she might have PNA and prescribed Azithromycin.  The cough improved minimally.  A week later, on Wednesday of this week she complained of L side/flank pain.  She was cognitively intact - not "100% crisp with her dialysis going on" but nothing like this.  She again saw her PCP and he thought maybe shingles, no rash.  She was given gabapentin, valacyclovir, and prednisone for "scarring" on CXR.  She did not take prednisone due to hyperglycemia in the past.  She took 1 gabapentin and 1-2 valacyclovir.  The next AM she was markedly confused.  PCP recommended no longer taking gabapentin, no prednisone.  She has continued to worsen.  She was able to report vision spinning, hallucinations, paranoia.  Thursday night, she was screaming, yelling, unable to stand.  Usually uses a cane or Rollator.  She did miss HD Wednesday.      ER Course:  Carryover, per Dr. Myna Hidalgo:  76 yr old lady with ESRD and HTN presenting with worsening confusion and cough. She was treated outpatient for pneumonia 10 days ago, continues to have cough and SOB, was seen again for left flank pain a couple days and started on gabapentin and valacyclovir for suspected zoster without rash, and has since developed worsening speech and cognitive disturbance. She was last  dialyzed on Monday. ED workup notable for potassium 6.4, BUN 82, and suspected pneumonia.  Still no rash per ED physician. Nephrology was consulted by ED and she was started on vancomycin and Zosyn.      Review of Systems: Unable to review all systems due to lack of cooperation from patient. Past Medical History:  Diagnosis Date   Anemia    Arthritis    Depression    Diabetes mellitus ([place])    Dyslipidemia 03/15/2021   ESRD (end stage renal disease) on dialysis [facility])    Dialysis M/W/F   Gout    History of anemia due to chronic kidney disease    History of degenerative disc disease    Hypertension    Pneumonia    Refusal of blood transfusions as patient is Jehovah's Witness    Secondary hyperparathyroidism ([place])    Spondylisthesis    Past Surgical History:  Procedure Laterality Date   A/V FISTULAGRAM Left 12/16/2018   Procedure: A/V FISTULAGRAM;  Surgeon: Marty Heck, MD;  Location: [place] [facility];  Service: Cardiovascular;  Laterality: Left;   A/V FISTULAGRAM Left 06/13/2019   Procedure: A/V FISTULAGRAM;  Surgeon: Marty Heck, MD;  Location: [place] [facility];  Service: Cardiovascular;  Laterality: Left;   AV FISTULA PLACEMENT Left 08/24/2018   Procedure: ARTERIOVENOUS (AV) FISTULA CREATION;  Surgeon: Waynetta Sandy, MD;  Location: [place];  Service: Vascular;  Laterality: Left;   AV FISTULA PLACEMENT Left 07/12/2019   Procedure:  INSERTION OF LEFT UPPER ARM  ARTERIOVENOUS (AV) GORE-TEX GRAFT;  Surgeon: Elam Dutch, MD;  Location: [place];  Service: Vascular;  Laterality: Left;   [place] Left 11/04/2018   Procedure: [place];  Surgeon: Waynetta Sandy, MD;  Location: [place];  Service: Vascular;  Laterality: Left;   COLONOSCOPY     HIP SURGERY     INSERTION OF DIALYSIS CATHETER N/A 06/15/2019   Procedure: INSERTION OF TUNNELED  DIALYSIS CATHETER RIGHT INTERNAL JUGULAR;  Surgeon: Serafina Mitchell, MD;  [facility];  Service: Vascular;  Laterality: N/A;   IR FLUORO GUIDE CV LINE RIGHT  08/21/2018   IR FLUORO GUIDE CV LINE RIGHT  06/08/2019   IR PERC TUN PERIT CATH WO PORT S&I /IMAG  12/19/2019   IR REMOVAL TUN CV CATH W/O FL  03/11/2019   IR THROMBECTOMY AV FISTULA W/THROMBOLYSIS/PTA INC/SHUNT/IMG LEFT Left 12/21/2019   IR US GUIDE VASC ACCESS LEFT  12/21/2019   IR US GUIDE VASC ACCESS RIGHT  08/21/2018   IR US GUIDE VASC ACCESS RIGHT  06/08/2019   IR US GUIDE VASC ACCESS RIGHT  12/19/2019   PERIPHERAL VASCULAR BALLOON ANGIOPLASTY Left 06/13/2019   Procedure: PERIPHERAL VASCULAR BALLOON ANGIOPLASTY;  Surgeon: Marty Heck, MD;  Location: [place] [facility];  Service: Cardiovascular;  Laterality: Left;  AVF   PERIPHERAL VASCULAR INTERVENTION Left 12/16/2018   Procedure: PERIPHERAL VASCULAR INTERVENTION;  Surgeon: Marty Heck, MD;  Location: [place] [facility];  Service: Cardiovascular;  Laterality: Left;  arm fistula   TOTAL HIP ARTHROPLASTY Left 06/10/2019   Procedure: TOTAL HIP ARTHROPLASTY ANTERIOR APPROACH;  Surgeon: Leandrew Koyanagi, MD;  Location: [place];  Service: Orthopedics;  Laterality: Left;   Social History:  reports that she quit smoking about 50 years ago. She has never used smokeless tobacco. She reports that she does not currently use alcohol. She reports that she does not use drugs.  No Known Allergies  Family History  Family history unknown: Yes    Prior to Admission medications   Medication Sig Start Date End Date Taking? Authorizing Provider  amLODipine (NORVASC) 5 MG tablet TAKE 1 TABLET (5 MG TOTAL) BY MOUTH DAILY. 12/26/19 12/25/20  Freida Busman, MD  amoxicillin (AMOXIL) 500 MG tablet Take 2g 1 hour prior to dental procedure 03/19/20   Leandrew Koyanagi, MD  Ascorbic Acid (VITAMIN C) 1000 MG tablet Take 1,000 mg by mouth 2 (two) times a day.     [provider]  atorvastatin (LIPITOR) 10 MG tablet Take 10 mg by mouth every evening.   05/14/18   [provider]  Cholecalciferol 100 MCG (4000 UT) CAPS Take 4,000 Units by mouth daily.    [provider]  escitalopram (LEXAPRO) 10 MG tablet Take 10 mg by mouth daily. 05/09/19   [provider]  ferric citrate (AURYXIA) 1 GM 210 MG(Fe) tablet Take 420 mg by mouth 3 (three) times daily with meals.    [provider]  lidocaine-prilocaine (EMLA) cream Apply 1 application topically as directed. Prior to Dialysis 02/11/19   [provider]  multivitamin (RENA-VIT) TABS tablet Take 1 tablet by mouth at bedtime. 08/27/18   Dhungel, Flonnie Overman, MD  Nutritional Supplements (FEEDING SUPPLEMENT, NEPRO CARB STEADY,) LIQD Take 240 mLs by mouth daily. 07/05/19   [provider]  Omega-3 Fatty Acids (FISH OIL) 1000 MG CAPS Take 1,000 mg by mouth daily.     [provider]  polyethylene glycol (MIRALAX) 17 g packet Take 17 g by mouth daily as needed for moderate constipation. Patient taking differently: Take 17 g by mouth daily as needed for mild constipation or moderate constipation.  08/27/18   Dhungel, Nishant, MD  Turmeric 500 MG CAPS Take 500 mg by mouth daily.     [provider]    Physical Exam: Vitals:   03/15/21 1500 03/15/21 1530 03/15/21 1600 03/15/21 1615  BP: (!) 202/82 (!) 141/83 (!) 178/79 (!) 178/77  Pulse: 72 75 77 79  Resp: (!) 25 (!) 25 (!) 24 (!) 23  Temp:      TempSrc:      SpO2:  100% 100% 100%  Weight:    69.3 kg  Height:       General:  Very altered - occasional nonsensical noises with agitation and restlessness Eyes:  PERRL, EOMI, normal lids, iris ENT:  grossly normal hearing, lips & tongue, mmm Neck:  no LAD, masses or thyromegaly Cardiovascular:  RRR, no m/r/g. No LE edema.  Respiratory:   CTA bilaterally with no wheezes/rales/rhonchi.  Normal to mildly increased respiratory effort. Abdomen:  soft, NT, ND Skin:  no rash or induration seen on limited exam Musculoskeletal:  grossly normal tone  BUE/BLE, no bony abnormality Psychiatric:  very altered mood and affect, speech minimal and nonsensical noises Neurologic:  unable to perform   Radiological Exams on Admission: Independently reviewed - see discussion in A/P where applicable  CT Head Wo Contrast  Result Date: 03/15/2021 CLINICAL DATA:  76 year old female with history of mental status change. EXAM: CT HEAD WITHOUT CONTRAST TECHNIQUE: Contiguous axial images were obtained from the base of the skull through the vertex without intravenous contrast. RADIATION DOSE REDUCTION: This exam was performed according to the departmental dose-optimization program which includes automated exposure control, adjustment of the mA and/or kV according to patient size and/or use of iterative reconstruction technique. COMPARISON:  Head CT 12/18/2019. FINDINGS: Brain: Mild cerebral atrophy. Patchy and confluent areas of decreased attenuation are noted throughout the deep and periventricular white matter of the cerebral hemispheres bilaterally, compatible with chronic microvascular ischemic disease. No evidence of acute infarction, hemorrhage, hydrocephalus, extra-axial collection or mass lesion/mass effect. Vascular: No hyperdense vessel or unexpected calcification. Skull: Normal. Negative for fracture or focal lesion. Sinuses/Orbits: No acute finding. Other: None. IMPRESSION: 1. No acute intracranial abnormalities. 2. Mild cerebral atrophy with extensive chronic microvascular ischemic changes in the cerebral white matter, as above. Electronically Signed   By: Vinnie Langton M.D.   On: 03/15/2021 06:05   DG Chest Portable 1 View  Result Date: 03/15/2021 CLINICAL DATA:  76 year old female with history of altered mental status. EXAM: PORTABLE CHEST 1 VIEW COMPARISON:  Chest x-ray 03/06/2021. FINDINGS: Again noted are widespread areas of interstitial prominence and chronic regions of architectural distortion, most evident throughout the mid to upper lungs  bilaterally, similar to prior examinations. However, there is increasing conspicuity of a nodular area of architectural distortion in the periphery of the left upper lobe estimated to measure 1.1 x 0.8 cm on today's examination. Diffuse peribronchial cuffing. Patchy ill-defined opacities are also increasingly conspicuous, particularly in the left mid to lower lung, concerning for potential developing bronchopneumonia. No pleural effusions. No pneumothorax. No evidence of pulmonary edema. Heart size is mildly enlarged. The patient is rotated to the right on today's exam, resulting in distortion of the mediastinal contours and reduced diagnostic sensitivity and specificity for mediastinal pathology. IMPRESSION: 1. Findings are concerning for bronchitis and developing bronchopneumonia  in the left lung base. 2. Increasingly conspicuous nodular density in the periphery of the left upper lobe. If there is clinical concern for neoplasm, further evaluation with noncontrast chest CT would be recommended at this time. At the very least, repeat standing PA and lateral chest radiograph would be recommended in 2 months to ensure the stability or regression of this finding. 3. Chronic scarring in the lungs bilaterally, most evident throughout the mid to upper lungs, similar to prior examinations. 4. Mild cardiomegaly. Electronically Signed   By: Vinnie Langton M.D.   On: 03/15/2021 06:42    EKG: Independently reviewed.  NSR with rate 71; nonspecific ST changes with NSCSLT   Labs on Admission: I have personally reviewed the available labs and imaging studies at the time of the admission.  Pertinent labs:    K+ 6.4 CO2 20 Glucose 185 BUN 82/Creatinine 12.97/GFR 3 Anion gap 25 Lactate 1.4 Unremarkable CBC COVID/flu negative   Assessment/Plan * Acute metabolic encephalopathy- (present on admission) -Patient had been diagnosed with PNA last week and treated with Azithromycin -She returned to her PCP with c/o L  side/flank pain and there was concern for shingles so she was treated empirically with gabapentin and valacyclovir -She has been increasing altered since -This is in the setting of missed HD on Wednesday -Strongly suspect medication toxicity in the setting of poor renal clearance -I have been in contact with Dr. Jonnie Finner and requested HD as soon as possible -Will admit to telemetry -Delirium precautions -Resume Lexapro when MS clears  Pneumonia- (present on admission) -She does have a cough and left-sided pain with imaging suggestive of LLL bronchopneumonia -She also has a nodular opacity in the LUL that may be contributing to her presentation -Once her mental status clears, she will need CT for further evaluation of this possible neoplasm (son is aware) -For now, will treat with Rocephin/Azithromycin for CAP (MRSA negative so Vanc not continued) -She did have an episode of "hypoxia" in HD but this may have been associated with her AMS and poor O2 monitoring, as it improved with replacement of the O2 sensor  End-stage renal disease on hemodialysis [facility]) -Patient on chronic MWF HD -Nephrology prn order set utilized -Missed Wednesday session, which may have increased her risk of medication reaction -Nephrology notified that patient will need HD -Continue Renavite  Dyslipidemia- (present on admission) -Continue Lipitor  Diabetes mellitus ([place]) -A1c is 5.2 -Not on meds -Will cover with very sensitive-scale SSI, although she may not need anything  Essential hypertension- (present on admission) -Continue Labetalol, hydralazine, Norvasc -Will add prn IV hydralazine    Advance Care Planning:   Code Status: Full Code   Consults: Nephrology; [facility] team; RT; PT/OT  Family Communication: Son was present throughout evaluation  Severity of Illness: The appropriate patient status for this patient is INPATIENT. Inpatient status is judged to be reasonable and necessary in order to provide the  required intensity of service to ensure the patient's safety. The patient's presenting symptoms, physical exam findings, and initial radiographic and laboratory data in the context of their chronic comorbidities is felt to place them at high risk for further clinical deterioration. Furthermore, it is not anticipated that the patient will be medically stable for discharge from the hospital within 2 midnights of admission.   * I certify that at the point of admission it is my clinical judgment that the patient will require inpatient hospital care spanning beyond 2 midnights from the point of admission due to high intensity of  service, high risk for further deterioration and high frequency of surveillance required.*  Author: Karmen Bongo, MD 03/15/2021 5:11 PM  For on call review [email or web address].

## 2021-03-15 NOTE — Assessment & Plan Note (Addendum)
On Lipitor 

## 2021-03-15 NOTE — Consult Note (Signed)
Renal Service Consult Note [facility]  Bianca Myers 03/15/2021 Sol Blazing, MD Requesting Physician: Dr Lorin Mercy  Reason for Consult: ESRD pt w/ AMS HPI: The patient is a 76 y.o. year-old w/ hx of anemia, esrd on HD, gout, HTN who presented w/ "odd mannerisms and incoherent speech" that started shortly after she was put on a medication for shingles. Tuesday of last week she saw her PCP for a persistent cough and he put her on Azithromycin. This week on Wed she c/o L flank pain, again saw the PCP who thought she may have shingles (no rash) and prescribed valtrex, neurontin and prednisone.  Pt brought to ED on 1/13 early am today. Last HD was Monday, missed Wed.  In ED BP was 150/100, HR 70 RR 25-30, afebrile.  CXR showed possible PNA L base, scarring upper lobes. Na 134  K 6.4  BUN 82  creat 12.9  CO2 20  AG 25  Ca 9.4  Alb 3.9 Tbili 3.7  WBC 9K  Hb 13  plt 293. Pt admitted. Asked to see for ESRD.    Pt seen in HD unit.  She is very confused and nonverbal, eyes open. No answering any questions. Fidgety.     ROS - n/a   Past Medical History  Past Medical History:  Diagnosis Date   Anemia    Arthritis    Depression    Diabetes mellitus ([facility])    ESRD (end stage renal disease) on dialysis ([place])    Dialysis M/W/F   Gout    History of anemia due to chronic kidney disease    History of degenerative disc disease    Hypertension    Pneumonia    Refusal of blood transfusions as patient is Jehovah's Witness    Secondary hyperparathyroidism ([place])    Spondylisthesis    Past Surgical History  Past Surgical History:  Procedure Laterality Date   A/V FISTULAGRAM Left 12/16/2018   Procedure: A/V FISTULAGRAM;  Surgeon: Marty Heck, MD;  Location: [place] [facility];  Service: Cardiovascular;  Laterality: Left;   A/V FISTULAGRAM Left 06/13/2019   Procedure: A/V FISTULAGRAM;  Surgeon: Marty Heck, MD;  Location: [place] [facility];  Service: Cardiovascular;   Laterality: Left;   AV FISTULA PLACEMENT Left 08/24/2018   Procedure: ARTERIOVENOUS (AV) FISTULA CREATION;  Surgeon: Waynetta Sandy, MD;  Location: [place];  Service: Vascular;  Laterality: Left;   AV FISTULA PLACEMENT Left 07/12/2019   Procedure: INSERTION OF LEFT UPPER ARM  ARTERIOVENOUS (AV) GORE-TEX GRAFT;  Surgeon: Elam Dutch, MD;  Location: [place];  Service: Vascular;  Laterality: Left;   BASCILIC VEIN TRANSPOSITION Left 11/04/2018   Procedure: [place];  Surgeon: Waynetta Sandy, MD;  Location: [place];  Service: Vascular;  Laterality: Left;   COLONOSCOPY     HIP SURGERY     INSERTION OF DIALYSIS CATHETER N/A 06/15/2019   Procedure: INSERTION OF TUNNELED  DIALYSIS CATHETER RIGHT INTERNAL JUGULAR;  Surgeon: Serafina Mitchell, MD;  [facility];  Service: Vascular;  Laterality: N/A;   IR FLUORO GUIDE CV LINE RIGHT  08/21/2018   IR FLUORO GUIDE CV LINE RIGHT  06/08/2019   IR PERC TUN PERIT CATH WO PORT S&I /IMAG  12/19/2019   IR REMOVAL TUN CV CATH W/O FL  03/11/2019   IR THROMBECTOMY AV FISTULA W/THROMBOLYSIS/PTA INC/SHUNT/IMG LEFT Left 12/21/2019   IR US GUIDE VASC ACCESS LEFT  12/21/2019   IR US GUIDE  VASC ACCESS RIGHT  08/21/2018   IR US GUIDE VASC ACCESS RIGHT  06/08/2019   IR US GUIDE VASC ACCESS RIGHT  12/19/2019   PERIPHERAL VASCULAR BALLOON ANGIOPLASTY Left 06/13/2019   Procedure: PERIPHERAL VASCULAR BALLOON ANGIOPLASTY;  Surgeon: Marty Heck, MD;  Location: [place] [facility];  Service: Cardiovascular;  Laterality: Left;  AVF   PERIPHERAL VASCULAR INTERVENTION Left 12/16/2018   Procedure: PERIPHERAL VASCULAR INTERVENTION;  Surgeon: Marty Heck, MD;  Location: [place] [facility];  Service: Cardiovascular;  Laterality: Left;  arm fistula   TOTAL HIP ARTHROPLASTY Left 06/10/2019   Procedure: TOTAL HIP ARTHROPLASTY ANTERIOR APPROACH;  Surgeon: Leandrew Koyanagi, MD;  Location: [place];  Service: Orthopedics;  Laterality: Left;    Family History  Family History  Family history unknown: Yes   Social History  reports that she quit smoking about 50 years ago. She has never used smokeless tobacco. She reports that she does not currently use alcohol. She reports that she does not use drugs. Allergies No Known Allergies Home medications Prior to Admission medications   Medication Sig Start Date End Date Taking? Authorizing Provider  albuterol (VENTOLIN HFA) 108 (90 Base) MCG/ACT inhaler Inhale 1-2 puffs into the lungs every 4 (four) hours as needed for wheezing. 03/08/21  Yes [provider]  amLODipine (NORVASC) 5 MG tablet TAKE 1 TABLET (5 MG TOTAL) BY MOUTH DAILY. Patient taking differently: Take 5 mg by mouth daily. 12/26/19 03/15/21 Yes Freida Busman, MD  amoxicillin (AMOXIL) 500 MG tablet Take 2g 1 hour prior to dental procedure Patient taking differently: Take 500 mg by mouth once as needed (prior to dental procedure). 03/19/20  Yes Leandrew Koyanagi, MD  Ascorbic Acid (VITAMIN C) 1000 MG tablet Take 1,000 mg by mouth 2 (two) times a day.    Yes [provider]  atorvastatin (LIPITOR) 10 MG tablet Take 10 mg by mouth every evening.  05/14/18  Yes [provider]  Cholecalciferol 100 MCG (4000 UT) CAPS Take 4,000 Units by mouth daily.   Yes [provider]  escitalopram (LEXAPRO) 10 MG tablet Take 10 mg by mouth daily. 05/09/19  Yes [provider]  ferric citrate (AURYXIA) 1 GM 210 MG(Fe) tablet Take 420 mg by mouth 3 (three) times daily with meals.   Yes [provider]  hydrALAZINE (APRESOLINE) 25 MG tablet Take 25 mg by mouth in the morning, at noon, and at bedtime. 01/28/21  Yes [provider]  labetalol (NORMODYNE) 200 MG tablet Take 200 mg by mouth 2 (two) times daily. 03/01/21  Yes [provider]  lidocaine-prilocaine (EMLA) cream Apply 1 application topically See admin instructions. Prior to Dialysis 02/11/19  Yes [provider]   multivitamin (RENA-VIT) TABS tablet Take 1 tablet by mouth at bedtime. 08/27/18  Yes Dhungel, Nishant, MD  Nutritional Supplements (FEEDING SUPPLEMENT, NEPRO CARB STEADY,) LIQD Take 240 mLs by mouth daily. 07/05/19  Yes [provider]  Omega-3 Fatty Acids (FISH OIL) 1000 MG CAPS Take 1,000 mg by mouth daily.    Yes [provider]  polyethylene glycol (MIRALAX) 17 g packet Take 17 g by mouth daily as needed for moderate constipation. Patient taking differently: Take 17 g by mouth daily as needed for mild constipation or moderate constipation. 08/27/18  Yes Dhungel, Nishant, MD  Turmeric 500 MG CAPS Take 500 mg by mouth daily.    Yes [provider]     Vitals:   03/15/21 1330 03/15/21 1400 03/15/21 1430  03/15/21 1500  BP: (!) 176/80 (!) 159/74 (!) 125/58 (!) 202/82  Pulse: 74 75 75 72  Resp: (!) 27 (!) 25 (!) 28 (!) 25  Temp:      TempSrc:      SpO2: 100% 100%    Weight:      Height:       Exam Gen confused, staring off, no seizure/ myoclonus Not following commands, occ agitation better w/ restraints No rash, cyanosis or gangrene Sclera anicteric, throat not seen No jvd or bruits Chest occ rales L base, R clear RRR no MRG Abd soft ntnd no mass or ascites +bs GU deferred MS no joint effusions or deformity Ext no LE or UE edema, no wounds or ulcers Neuro is nonverbal, moving all ext x 4 , groaning   LUA aVF +t/b    Home meds include - norvasc, lipitor, lexapro, auryxia, hydralazine, labetalol, rena-vit, nepro, vits/supps/ prns     OP HD: MWF   3h  75kg  2/2 bath  LUA AVG  Hep 2000   - calcitriol 1.5 ug tiw   - mircera 200 q2, last 12/28    Assessment/ Plan: AMS - in esrd pt recently prescribed Valtrex.  Not sure the dosing but Valtrex can be a common cause of AMS when given to ESRD pt's at the non-ESRD dose. Which is probably what's happening here. Also got neurontin at what dose it is not sure, but has the same effect if not dosed at the ESRD  level.  None of this is permanent. Pt very confused. Valtrex takes at least 2-3 days to wash out in my experience, sometimes longer.  Neurontin effects are not as long.  Possible PNA - getting IV abx for suspected CAP, per pmd.  ESRD - on HD MWF.  HD today in progress. Missed OP HD Wed.  BP/ volume - on multiple BP lowering meds. Reintroduce here as needed. Not vol overloaded on exam. CXR w/ LLL changes, scarring upper lobes. Not tolerating a large UF on HD today.  Anemia ckd - Hb 13, no esa needs MBD ckd - cont binder and vdra w/ HD      Rob Macai Sisneros  MD 03/15/2021, 4:04 PM  Recent Labs  Lab 03/15/21 0500 03/15/21 0514  WBC 9.6  --   HGB 13.0 15.0   Recent Labs  Lab 03/15/21 0500 03/15/21 0514  K 6.4* 6.2*  BUN 82* 74*  CREATININE 12.97* 14.00*  CALCIUM 9.4  --

## 2021-03-15 NOTE — Assessment & Plan Note (Addendum)
Poorly controlled blood pressure amlodipine dose has been increased continue on labetalol and hydralazine, adjust the dose.  Monitor blood pressure

## 2021-03-15 NOTE — ED Triage Notes (Addendum)
Pt brought to ED by family member with c/o altered mental status since yesterday. Pt's son reports pt was seen by PCP this week and given medications for shingles. Since start of new medicine, pt began having odd mannerisms and incoherent speech that progressively worsened yesterday evening. Pt is hemodialysis pt and was last dialyzed on Monday.

## 2021-03-15 NOTE — Assessment & Plan Note (Addendum)
Multifactorial in the setting of missed dialysis and  2/2 gabapentin and valacyclovir-valacyclovir takes 2 to 3 days for washout.Suspecting acute metabolic encephalopathy.  Mentation overall stable. Cont HD per schedule. Cont LexaproSeen by psychiatry, seen by PT OT.  Plan for skilled nursing facility.

## 2021-03-15 NOTE — Assessment & Plan Note (Addendum)
1c stable 5.2, blood sugar controlled,continue on sliding scale insulin.  Not on meds at home. Recent Labs  Lab 03/15/21 0500 03/15/21 1156 03/15/21 1733 03/15/21 2112 03/16/21 0652  GLUCAP  --  153* 106* 132* 100*  HGBA1C 5.2  --   --   --   --

## 2021-03-15 NOTE — ED Notes (Signed)
Patient transported to dialysis

## 2021-03-15 NOTE — Progress Notes (Signed)
NEW ADMISSION NOTE New Admission Note:   Arrival Method: stretcher Mental Orientation: A&OX1 Telemetry: 5M15 Assessment: Completed Skin: intact, dry abraison R posterior thigh, bruise L leg IV: R hand Pain: 0/10 Tubes: fistula L arm Safety Measures: Safety Fall Prevention Plan has been given, discussed and signed Admission: Completed [facility] [place] Orientation: Patient has been orientated to the room, unit and staff.  Family: none at bedside  Orders have been reviewed and implemented. Will continue to monitor the patient. Call light has been placed within reach and bed alarm has been activated.   Renessa Wellnitz S Reigan Tolliver, RN

## 2021-03-15 NOTE — Procedures (Signed)
° °  I was present at this dialysis session, have reviewed the session itself and made  appropriate changes Kelly Splinter MD [place] pager [phone]   03/15/2021, 4:28 PM

## 2021-03-15 NOTE — Significant Event (Addendum)
Rapid Response Event Note   Reason for Call :  Increased work of breathing and acute oxygen desaturation- 74% on room air  Initial Focused Assessment:  Pt restless in bed, constant non-purposeful movements and sitting up in bed. Pt disoriented and not following commands. Cough is strong, congested, non-productive. Attempted oral suctioning, but pt noncompliant with it. Breathy is dyspneic with abdominal breathing. Lung sounds are diminished, R>L. With expiratory wheeze throughout.   VS: BP 127/62, HR 62, RR 27, SpO2 100% on NRB CBG: 153  Expiratory wheeze less prominent as pt RR slowed down. Pt weaned to [facility], SpO2 100%.   Interventions:  -3LNC -Oral suction at [facility] -RT at bedside for evaluation  Plan of Care:  -Pt to receive HD now -Wean oxygen at pt tolerates -Maintain suction at [facility] -Aspiration precautions -Oral care per protocol  Event Summary:  MD Notified: Dr. Lorin Mercy Call Time: [place] Time: 1225 End Time: [place], RN

## 2021-03-15 NOTE — Progress Notes (Signed)
Inpatient Diabetes Program Recommendations  AACE/ADA: New Consensus Statement on Inpatient Glycemic Control (2015)  Target Ranges:  Prepandial:   less than 140 mg/dL      Peak postprandial:   less than 180 mg/dL (1-2 hours)      Critically ill patients:  140 - 180 mg/dL   Lab Results  Component Value Date   GLUCAP 93 12/26/2019   HGBA1C 6.1 (H) 03/09/2019    Review of Glycemic Control  Diabetes history: DM 2 Outpatient Diabetes medications: None Current orders for Inpatient glycemic control:  Orders being placed for admission  A1c 6.1% on 1/6  Inpatient Diabetes Program Recommendations:    - consider Novolog 0-6 units Q6 hours  If glucose trends drop could back off to a custom scale starting at 200.  Thanks, Tama Headings RN, MSN, BC-ADM Inpatient Diabetes Coordinator Team Pager [phone] (8a-5p)

## 2021-03-15 NOTE — ED Provider Notes (Signed)
[facility] EMERGENCY DEPARTMENT Provider Note   CSN: [id number] Arrival date & time: 03/15/21  0434     History  Chief Complaint  Patient presents with   Altered Mental Status   Weakness    Bianca Myers is a 76 y.o. female.  76 yo F with a chief complaints of altered mental status.  She had been seen a couple times in the past week or so for cough and left flank pain.  She was diagnosed presumptively with pneumonia and possibly shingles and was started on medications at that visit.  She did not take the prednisone because it made her hyperglycemic in the past.  She did miss her dialysis session to make it to this visit.  Yesterday she started having increased confusion and got really difficult to control at home.  Son says she acted this way when she was bacteremic in the past though at that time she was little bit more sleepy.  He denies any head injury no obvious fevers at home.  Denies abdominal pain.   Altered Mental Status Associated symptoms: weakness   Associated symptoms: no fever, no headaches, no nausea, no palpitations and no vomiting   Weakness Associated symptoms: no arthralgias, no chest pain, no dizziness, no dysuria, no fever, no headaches, no myalgias, no nausea, no shortness of breath, no urgency and no vomiting       Home Medications Prior to Admission medications   Medication Sig Start Date End Date Taking? Authorizing Provider  amLODipine (NORVASC) 5 MG tablet TAKE 1 TABLET (5 MG TOTAL) BY MOUTH DAILY. 12/26/19 12/25/20  Freida Busman, MD  amoxicillin (AMOXIL) 500 MG tablet Take 2g 1 hour prior to dental procedure 03/19/20   Leandrew Koyanagi, MD  Ascorbic Acid (VITAMIN C) 1000 MG tablet Take 1,000 mg by mouth 2 (two) times a day.     [provider]  atorvastatin (LIPITOR) 10 MG tablet Take 10 mg by mouth every evening.  05/14/18   [provider]  Cholecalciferol 100 MCG (4000 UT) CAPS Take 4,000 Units by mouth daily.     [provider]  escitalopram (LEXAPRO) 10 MG tablet Take 10 mg by mouth daily. 05/09/19   [provider]  ferric citrate (AURYXIA) 1 GM 210 MG(Fe) tablet Take 420 mg by mouth 3 (three) times daily with meals.    [provider]  lidocaine-prilocaine (EMLA) cream Apply 1 application topically as directed. Prior to Dialysis 02/11/19   [provider]  multivitamin (RENA-VIT) TABS tablet Take 1 tablet by mouth at bedtime. 08/27/18   Dhungel, Flonnie Overman, MD  Nutritional Supplements (FEEDING SUPPLEMENT, NEPRO CARB STEADY,) LIQD Take 240 mLs by mouth daily. 07/05/19   [provider]  Omega-3 Fatty Acids (FISH OIL) 1000 MG CAPS Take 1,000 mg by mouth daily.     [provider]  polyethylene glycol (MIRALAX) 17 g packet Take 17 g by mouth daily as needed for moderate constipation. Patient taking differently: Take 17 g by mouth daily as needed for mild constipation or moderate constipation.  08/27/18   Dhungel, Nishant, MD  Turmeric 500 MG CAPS Take 500 mg by mouth daily.     [provider]      Allergies    Patient has no known allergies.    Review of Systems   Review of Systems  Constitutional:  Positive for activity change. Negative for chills and fever.  HENT:  Negative for congestion and rhinorrhea.   Eyes:  Negative  for redness and visual disturbance.  Respiratory:  Negative for shortness of breath and wheezing.   Cardiovascular:  Negative for chest pain and palpitations.  Gastrointestinal:  Negative for nausea and vomiting.  Genitourinary:  Negative for dysuria and urgency.  Musculoskeletal:  Negative for arthralgias and myalgias.  Skin:  Negative for pallor and wound.  Neurological:  Positive for weakness. Negative for dizziness and headaches.   Physical Exam Updated Vital Signs BP (!) 157/112 (BP Location: Right Arm)    Pulse 72    Temp (!) 97.2 F (36.2 C) (Rectal)    Resp (!) 23    SpO2 98%  Physical Exam Vitals and nursing  note reviewed.  Constitutional:      General: She is not in acute distress.    Appearance: She is well-developed. She is not diaphoretic.  HENT:     Head: Normocephalic and atraumatic.  Eyes:     Pupils: Pupils are equal, round, and reactive to light.  Cardiovascular:     Rate and Rhythm: Normal rate and regular rhythm.     Heart sounds: No murmur heard.   No friction rub. No gallop.  Pulmonary:     Effort: Pulmonary effort is normal.     Breath sounds: No wheezing or rales.  Abdominal:     General: There is no distension.     Palpations: Abdomen is soft.     Tenderness: There is no abdominal tenderness.  Musculoskeletal:        General: No tenderness.     Cervical back: Normal range of motion and neck supple.     Comments: Left AV fistula with palpable thrill  Skin:    General: Skin is warm and dry.     Comments: Skin is quite warm to touch  Neurological:     Mental Status: She is alert.     Comments: Looking around the room like she is seeing things that are not actually there.  Moving all 4 extremities spontaneously.-  Psychiatric:        Behavior: Behavior normal.    ED Results / Procedures / Treatments   Labs (all labs ordered are listed, but only abnormal results are displayed) Labs Reviewed  CBC WITH DIFFERENTIAL/PLATELET - Abnormal; Notable for the following components:      Result Value   RDW 18.2 (*)    Neutro Abs 7.9 (*)    All other components within normal limits  COMPREHENSIVE METABOLIC PANEL - Abnormal; Notable for the following components:   Sodium 134 (*)    Potassium 6.4 (*)    Chloride 89 (*)    CO2 20 (*)    Glucose, Bld 185 (*)    BUN 82 (*)    Creatinine, Ser 12.97 (*)    Total Bilirubin 3.7 (*)    GFR, Estimated 3 (*)    Anion gap 25 (*)    All other components within normal limits  I-STAT CHEM 8, ED - Abnormal; Notable for the following components:   Sodium 131 (*)    Potassium 6.2 (*)    BUN 74 (*)    Creatinine, Ser 14.00 (*)     Glucose, Bld 184 (*)    Calcium, Ion 1.02 (*)    TCO2 20 (*)    All other components within normal limits  RESP PANEL BY RT-PCR (FLU A&B, COVID) ARPGX2  CULTURE, BLOOD (ROUTINE X 2)  CULTURE, BLOOD (ROUTINE X 2)  LACTIC ACID, PLASMA  AMMONIA  LACTIC ACID, PLASMA  URINALYSIS,  ROUTINE W REFLEX MICROSCOPIC  RAPID URINE DRUG SCREEN, HOSP PERFORMED    EKG EKG Interpretation  Date/Time:  Friday March 15 2021 06:23:31 EST Ventricular Rate:  71 PR Interval:  164 QRS Duration: 108 QT Interval:  454 QTC Calculation: 494 R Axis:   80 Text Interpretation: Sinus rhythm Probable left ventricular hypertrophy ST elevation, consider anterior injury Borderline prolonged QT interval No significant change since last tracing Confirmed by Deno Etienne [id number]) on 03/15/2021 6:45:55 AM  Radiology CT Head Wo Contrast  Result Date: 03/15/2021 CLINICAL DATA:  76 year old female with history of mental status change. EXAM: CT HEAD WITHOUT CONTRAST TECHNIQUE: Contiguous axial images were obtained from the base of the skull through the vertex without intravenous contrast. RADIATION DOSE REDUCTION: This exam was performed according to the departmental dose-optimization program which includes automated exposure control, adjustment of the mA and/or kV according to patient size and/or use of iterative reconstruction technique. COMPARISON:  Head CT 12/18/2019. FINDINGS: Brain: Mild cerebral atrophy. Patchy and confluent areas of decreased attenuation are noted throughout the deep and periventricular white matter of the cerebral hemispheres bilaterally, compatible with chronic microvascular ischemic disease. No evidence of acute infarction, hemorrhage, hydrocephalus, extra-axial collection or mass lesion/mass effect. Vascular: No hyperdense vessel or unexpected calcification. Skull: Normal. Negative for fracture or focal lesion. Sinuses/Orbits: No acute finding. Other: None. IMPRESSION: 1. No acute intracranial  abnormalities. 2. Mild cerebral atrophy with extensive chronic microvascular ischemic changes in the cerebral white matter, as above. Electronically Signed   By: Vinnie Langton M.D.   On: 03/15/2021 06:05   DG Chest Portable 1 View  Result Date: 03/15/2021 CLINICAL DATA:  76 year old female with history of altered mental status. EXAM: PORTABLE CHEST 1 VIEW COMPARISON:  Chest x-ray 03/06/2021. FINDINGS: Again noted are widespread areas of interstitial prominence and chronic regions of architectural distortion, most evident throughout the mid to upper lungs bilaterally, similar to prior examinations. However, there is increasing conspicuity of a nodular area of architectural distortion in the periphery of the left upper lobe estimated to measure 1.1 x 0.8 cm on today's examination. Diffuse peribronchial cuffing. Patchy ill-defined opacities are also increasingly conspicuous, particularly in the left mid to lower lung, concerning for potential developing bronchopneumonia. No pleural effusions. No pneumothorax. No evidence of pulmonary edema. Heart size is mildly enlarged. The patient is rotated to the right on today's exam, resulting in distortion of the mediastinal contours and reduced diagnostic sensitivity and specificity for mediastinal pathology. IMPRESSION: 1. Findings are concerning for bronchitis and developing bronchopneumonia in the left lung base. 2. Increasingly conspicuous nodular density in the periphery of the left upper lobe. If there is clinical concern for neoplasm, further evaluation with noncontrast chest CT would be recommended at this time. At the very least, repeat standing PA and lateral chest radiograph would be recommended in 2 months to ensure the stability or regression of this finding. 3. Chronic scarring in the lungs bilaterally, most evident throughout the mid to upper lungs, similar to prior examinations. 4. Mild cardiomegaly. Electronically Signed   By: Vinnie Langton M.D.   On:  03/15/2021 06:42    Procedures Procedures    Medications Ordered in ED Medications  vancomycin (VANCOREADY) IVPB 1500 mg/300 mL (has no administration in time range)  piperacillin-tazobactam (ZOSYN) IVPB 3.375 g (has no administration in time range)    ED Course/ Medical Decision Making/ A&P  Medical Decision Making  76 yo F with a chief complaint of altered mental status.  I reviewed the patient's recent visit to her PCP, presumably started on treatment for shingles valacyclovir and gabapentin prednisone and started on an antibiotic for possible pneumonia.  Since then the patient has become increasingly confused and hallucinating.  Most likely a medication reaction but quite warm to touch and on my view of the chest x-ray she has some opacity in the left lung base.  We will start her on broad-spectrum antibiotics.  Blood cultures.  She is uremic but I think this is unlikely to cause this presentation.  I did discuss the case with the nephrologist on-call and will arrange for dialysis this morning.  Discussed with medicine for admission.  CRITICAL CARE Performed by: Cecilio Asper   Total critical care time: 35 minutes  Critical care time was exclusive of separately billable procedures and treating other patients.  Critical care was necessary to treat or prevent imminent or life-threatening deterioration.  Critical care was time spent personally by me on the following activities: development of treatment plan with patient and/or surrogate as well as nursing, discussions with consultants, evaluation of patient's response to treatment, examination of patient, obtaining history from patient or surrogate, ordering and performing treatments and interventions, ordering and review of laboratory studies, ordering and review of radiographic studies, pulse oximetry and re-evaluation of patient's condition.  The patients results and plan were reviewed and discussed.    Any x-rays performed were independently reviewed by myself.   Differential diagnosis were considered with the presenting HPI.  Medications  vancomycin (VANCOREADY) IVPB 1500 mg/300 mL (has no administration in time range)  piperacillin-tazobactam (ZOSYN) IVPB 3.375 g (has no administration in time range)    Vitals:   03/15/21 0442 03/15/21 0639 03/15/21 0642  BP: (!) 151/110 (!) 157/112   Pulse: (!) 59 72   Resp: 20 (!) 23   Temp: 97.6 F (36.4 C) 97.9 F (36.6 C) (!) 97.2 F (36.2 C)  TempSrc: Oral Rectal Rectal  SpO2: 95% 98%     Final diagnoses:  Acute encephalopathy    Admission/ observation were discussed with the admitting physician, patient and/or family and they are comfortable with the plan.          Final Clinical Impression(s) / ED Diagnoses Final diagnoses:  Acute encephalopathy    Rx / DC Orders ED Discharge Orders     None         Deno Etienne, DO 03/15/21 [phone]

## 2021-03-15 NOTE — Assessment & Plan Note (Addendum)
Imaging- LLL bronchopneumonia,nodular density in the LUL- CT chest showed minimally displaced acute posterior left eighth rib fracture, chronic scarring in the bilateral upper lobes and superior segment of right lower lower lob.  Continue pain control

## 2021-03-15 NOTE — ED Provider Triage Note (Signed)
Emergency Medicine Provider Triage Evaluation Note  Bianca Myers , a 76 y.o. female  was evaluated in triage.  Pt complains of ams.  History of provided by patient's son.  States on Wednesday patient was unable to go to dialysis due to severe left-sided back pain.  Saw PCP, who was concerned about possible shingles and started patient on gabapentin and valacyclovir (Patient also given prednisone but did not take it).  The following day, Thursday, patient became altered and encephalopathic.  She is normally able to hold a full conversation, but she is now confused, alert only to self, and appears to be responding to internal stimuli. No psych history. She is more unsteady than baseline per son. She has not taken any more of the new medications, but sxs have progressed.  She is a dialysis pt, goes m/w/f. Last session Monday (pt missed wed for dr's apt).   Review of Systems  Positive: AMS, unsteadiness Negative: fever  Physical Exam  BP (!) 151/110 (BP Location: Right Arm)    Pulse (!) 59    Temp 97.6 F (36.4 C) (Oral)    Resp 20    SpO2 95%  Gen:   Alert to person. Confused and clearly encephalopathic. Resp:  Normal effort  MSK:   Moves extremities without difficulty  Other:  No ttp of abd or back  Medical Decision Making  Medically screening exam initiated at 5:02 AM.  Appropriate orders placed.  Aloni Chuang was informed that the remainder of the evaluation will be completed by another provider, this initial triage assessment does not replace that evaluation, and the importance of remaining in the ED until their evaluation is complete.  Labs, ct head, cxr, and ua.   Charge RN made aware pt needs next room available.    Franchot Heidelberg, PA-C 03/15/21 0505

## 2021-03-15 NOTE — Assessment & Plan Note (Addendum)
On HD MWF getting dialysis today.  She missed HD Wednesday-which may have increased her risk of medication reaction, nephro following on board.  On binders but some diarrhea/incontinence.

## 2021-03-16 DIAGNOSIS — R451 Restlessness and agitation: Secondary | ICD-10-CM

## 2021-03-16 DIAGNOSIS — G9341 Metabolic encephalopathy: Secondary | ICD-10-CM

## 2021-03-16 LAB — BASIC METABOLIC PANEL
Anion gap: 18 — ABNORMAL HIGH (ref 5–15)
BUN: 33 mg/dL — ABNORMAL HIGH (ref 8–23)
CO2: 26 mmol/L (ref 22–32)
Calcium: 9 mg/dL (ref 8.9–10.3)
Chloride: 93 mmol/L — ABNORMAL LOW (ref 98–111)
Creatinine, Ser: 7.35 mg/dL — ABNORMAL HIGH (ref 0.44–1.00)
GFR, Estimated: 5 mL/min — ABNORMAL LOW (ref >60)
Glucose, Bld: 101 mg/dL — ABNORMAL HIGH (ref 70–99)
Potassium: 4.9 mmol/L (ref 3.5–5.1)
Sodium: 137 mmol/L (ref 135–145)

## 2021-03-16 LAB — HEPATITIS B SURFACE ANTIBODY, QUANTITATIVE: Hep B S AB Quant (Post): 66.7 m[IU]/mL (ref >9.9)

## 2021-03-16 LAB — CBC
HCT: 37.2 % (ref 36.0–46.0)
Hemoglobin: 11.9 g/dL — ABNORMAL LOW (ref 12.0–15.0)
MCH: 28.8 pg (ref 26.0–34.0)
MCHC: 32 g/dL (ref 30.0–36.0)
MCV: 90.1 fL (ref 80.0–100.0)
Platelets: 270 10*3/uL (ref 150–400)
RBC: 4.13 MIL/uL (ref 3.87–5.11)
RDW: 17.9 % — ABNORMAL HIGH (ref 11.5–15.5)
WBC: 8 10*3/uL (ref 4.0–10.5)
nRBC: 0 % (ref 0.0–0.2)

## 2021-03-16 LAB — GLUCOSE, CAPILLARY
Glucose-Capillary: 100 mg/dL — ABNORMAL HIGH (ref 70–99)
Glucose-Capillary: 113 mg/dL — ABNORMAL HIGH (ref 70–99)
Glucose-Capillary: 114 mg/dL — ABNORMAL HIGH (ref 70–99)
Glucose-Capillary: 155 mg/dL — ABNORMAL HIGH (ref 70–99)

## 2021-03-16 MED ORDER — ASENAPINE MALEATE 5 MG SL SUBL
5.0000 mg | SUBLINGUAL_TABLET | Freq: Two times a day (BID) | SUBLINGUAL | Status: DC | PRN
  Filled 2021-03-16: qty 1

## 2021-03-16 MED ORDER — FERRIC CITRATE 1 GM 210 MG(FE) PO TABS
420.0000 mg | ORAL_TABLET | Freq: Three times a day (TID) | ORAL | Status: DC
  Administered 2021-03-16 – 2021-03-22 (×13): 420 mg via ORAL
  Filled 2021-03-16 (×16): qty 2

## 2021-03-16 MED ORDER — AZITHROMYCIN 500 MG PO TABS
500.0000 mg | ORAL_TABLET | Freq: Every day | ORAL | Status: AC
  Administered 2021-03-16 – 2021-03-19 (×4): 500 mg via ORAL
  Filled 2021-03-16 (×4): qty 1

## 2021-03-16 NOTE — Consult Note (Signed)
[facility] Face-to-Face Psychiatry Consult   Reason for Consult:  ''Acute encephalopathy, agitation Referring Physician:  Antonieta Pert, MD Patient Identification: Bianca Myers MRN:  [id number] Principal Diagnosis: Acute metabolic encephalopathy Diagnosis:  Principal Problem:   Acute metabolic encephalopathy Active Problems:   End-stage renal disease on hemodialysis ([place])   Anemia of chronic kidney failure, stage 5 ([place])   Essential hypertension   Diabetes mellitus ([place])   Pneumonia   Dyslipidemia   Psychomotor agitation   Total Time spent with patient: 1 hour  Subjective:   Bianca Myers is a 76 y.o. female patient admitted with weakness and altered mental status.  HPI:  76 y.o. female with PMH of ESRD on HD MWF,DM,HTN, depression who was admitted to the hospital due to weakness and AMS. Patient is a poor historian but collateral information was obtained from her son/care giver Doristine Bosworth) who is at patient's bedside. He reports that patient has been dealing with a lot of stressful situations in the last few weeks. He said patient was treated outpatient for pneumonia almost 2 weeks ago, continues to have cough and shortness of breath. She was also treated for Shingles with severe left flank pain few days ago. And states that patient missed dialysis on Wednesday due to being busy with new onset medical issues. My hypothesis is that patient agitation, recent behavior changes are related to the infections-pneumonia, Shingles and missing dialysis day. Today, patient is alert, fairly oriented, cooperative, denies psychosis, delusions and asking for food.   Past Psychiatric History: as above  Risk to Self:  denies Risk to Others:  denies Prior Inpatient Therapy:  not applicable Prior Outpatient Therapy:  none for psych issues  Past Medical History:  Past Medical History:  Diagnosis Date   Anemia    Arthritis    Depression    Diabetes mellitus ([place])    Dyslipidemia 03/15/2021    ESRD (end stage renal disease) on dialysis [facility])    Dialysis M/W/F   Gout    History of anemia due to chronic kidney disease    History of degenerative disc disease    Hypertension    Pneumonia    Refusal of blood transfusions as patient is Jehovah's Witness    Secondary hyperparathyroidism ([place])    Spondylisthesis     Past Surgical History:  Procedure Laterality Date   A/V FISTULAGRAM Left 12/16/2018   Procedure: A/V FISTULAGRAM;  Surgeon: Marty Heck, MD;  Location: [place] [facility];  Service: Cardiovascular;  Laterality: Left;   A/V FISTULAGRAM Left 06/13/2019   Procedure: A/V FISTULAGRAM;  Surgeon: Marty Heck, MD;  Location: [place] [facility];  Service: Cardiovascular;  Laterality: Left;   AV FISTULA PLACEMENT Left 08/24/2018   Procedure: ARTERIOVENOUS (AV) FISTULA CREATION;  Surgeon: Waynetta Sandy, MD;  Location: [place];  Service: Vascular;  Laterality: Left;   AV FISTULA PLACEMENT Left 07/12/2019   Procedure: INSERTION OF LEFT UPPER ARM  ARTERIOVENOUS (AV) GORE-TEX GRAFT;  Surgeon: Elam Dutch, MD;  Location: [place];  Service: Vascular;  Laterality: Left;   BASCILIC VEIN TRANSPOSITION Left 11/04/2018   Procedure: [place];  Surgeon: Waynetta Sandy, MD;  Location: [place];  Service: Vascular;  Laterality: Left;   COLONOSCOPY     HIP SURGERY     INSERTION OF DIALYSIS CATHETER N/A 06/15/2019   Procedure: INSERTION OF TUNNELED  DIALYSIS CATHETER RIGHT INTERNAL JUGULAR;  Surgeon: Serafina Mitchell, MD;  Location: [place];  Service: Vascular;  Laterality:  N/A;   IR FLUORO GUIDE CV LINE RIGHT  08/21/2018   IR FLUORO GUIDE CV LINE RIGHT  06/08/2019   IR PERC TUN PERIT CATH WO PORT S&I /IMAG  12/19/2019   IR REMOVAL TUN CV CATH W/O FL  03/11/2019   IR THROMBECTOMY AV FISTULA W/THROMBOLYSIS/PTA INC/SHUNT/IMG LEFT Left 12/21/2019   IR US GUIDE VASC ACCESS LEFT  12/21/2019   IR US GUIDE VASC ACCESS RIGHT  08/21/2018   IR US  GUIDE VASC ACCESS RIGHT  06/08/2019   IR US GUIDE VASC ACCESS RIGHT  12/19/2019   PERIPHERAL VASCULAR BALLOON ANGIOPLASTY Left 06/13/2019   Procedure: PERIPHERAL VASCULAR BALLOON ANGIOPLASTY;  Surgeon: Marty Heck, MD;  Location: [place] [facility];  Service: Cardiovascular;  Laterality: Left;  AVF   PERIPHERAL VASCULAR INTERVENTION Left 12/16/2018   Procedure: PERIPHERAL VASCULAR INTERVENTION;  Surgeon: Marty Heck, MD;  Location: [place] [facility];  Service: Cardiovascular;  Laterality: Left;  arm fistula   TOTAL HIP ARTHROPLASTY Left 06/10/2019   Procedure: TOTAL HIP ARTHROPLASTY ANTERIOR APPROACH;  Surgeon: Leandrew Koyanagi, MD;  Location: [place];  Service: Orthopedics;  Laterality: Left;   Family History:  Family History  Family history unknown: Yes   Family Psychiatric  History:  Social History:  Social History   Substance and Sexual Activity  Alcohol Use Not Currently     Social History   Substance and Sexual Activity  Drug Use Never    Social History   Socioeconomic History   Marital status: Widowed    Spouse name: Not on file   Number of children: Not on file   Years of education: Not on file   Highest education level: Not on file  Occupational History   Occupation: retired  Tobacco Use   Smoking status: Former    Types: Cigarettes    Quit date: 1973    Years since quitting: 50.0   Smokeless tobacco: Never  Vaping Use   Vaping Use: Never used  Substance and Sexual Activity   Alcohol use: Not Currently   Drug use: Never   Sexual activity: Not on file  Other Topics Concern   Not on file  Social History Narrative   Not on file   Social Determinants of Health   Financial Resource Strain: Not on file  Food Insecurity: Not on file  Transportation Needs: Not on file  Physical Activity: Not on file  Stress: Not on file  Social Connections: Not on file   Additional Social History:    Allergies:  No Known Allergies  Labs:  Results for orders  placed or performed during the hospital encounter of 03/15/21 (from the past 48 hour(s))  Resp Panel by RT-PCR (Flu A&B, Covid) Nasopharyngeal Swab     Status: None   Collection Time: 03/15/21  4:52 AM   Specimen: Nasopharyngeal Swab; Nasopharyngeal(NP) swabs in vial transport medium  Result Value Ref Range   SARS Coronavirus 2 by RT PCR NEGATIVE NEGATIVE    Comment: (NOTE) SARS-CoV-2 target nucleic acids are NOT DETECTED.  The SARS-CoV-2 RNA is generally detectable in upper respiratory specimens during the acute phase of infection. The lowest concentration of SARS-CoV-2 viral copies this assay can detect is 138 copies/mL. A negative result does not preclude SARS-Cov-2 infection and should not be used as the sole basis for treatment or other patient management decisions. A negative result may occur with  improper specimen collection/handling, submission of specimen other than nasopharyngeal swab, presence of viral mutation(s) within the areas  targeted by this assay, and inadequate number of viral copies(<138 copies/mL). A negative result must be combined with clinical observations, patient history, and epidemiological information. The expected result is Negative.  Fact Sheet for Patients:  EntrepreneurPulse.com.au  Fact Sheet for Healthcare Providers:  IncredibleEmployment.be  This test is no t yet approved or cleared by the [place] FDA and  has been authorized for detection and/or diagnosis of SARS-CoV-2 by FDA under an Emergency Use Authorization (EUA). This EUA will remain  in effect (meaning this test can be used) for the duration of the COVID-19 declaration under Section 564(b)(1) of the Act, 21 U.S.C.section 360bbb-3(b)(1), unless the authorization is terminated  or revoked sooner.       Influenza A by PCR NEGATIVE NEGATIVE   Influenza B by PCR NEGATIVE NEGATIVE    Comment: (NOTE) The Xpert Xpress SARS-CoV-2/FLU/RSV plus assay is  intended as an aid in the diagnosis of influenza from Nasopharyngeal swab specimens and should not be used as a sole basis for treatment. Nasal washings and aspirates are unacceptable for Xpert Xpress SARS-CoV-2/FLU/RSV testing.  Fact Sheet for Patients: EntrepreneurPulse.com.au  Fact Sheet for Healthcare Providers: IncredibleEmployment.be  This test is not yet approved or cleared by the [place] FDA and has been authorized for detection and/or diagnosis of SARS-CoV-2 by FDA under an Emergency Use Authorization (EUA). This EUA will remain in effect (meaning this test can be used) for the duration of the COVID-19 declaration under Section 564(b)(1) of the Act, 21 U.S.C. section 360bbb-3(b)(1), unless the authorization is terminated or revoked.  Performed at [facility], [place] [address], [place], [place] [address]   CBC with Differential     Status: Abnormal   Collection Time: 03/15/21  5:00 AM  Result Value Ref Range   WBC 9.6 4.0 - 10.5 K/uL   RBC 4.36 3.87 - 5.11 MIL/uL   Hemoglobin 13.0 12.0 - 15.0 g/dL   HCT 40.0 36.0 - 46.0 %   MCV 91.7 80.0 - 100.0 fL   MCH 29.8 26.0 - 34.0 pg   MCHC 32.5 30.0 - 36.0 g/dL   RDW 18.2 (H) 11.5 - 15.5 %   Platelets 293 150 - 400 K/uL   nRBC 0.0 0.0 - 0.2 %   Neutrophils Relative % 82 %   Neutro Abs 7.9 (H) 1.7 - 7.7 K/uL   Lymphocytes Relative 12 %   Lymphs Abs 1.2 0.7 - 4.0 K/uL   Monocytes Relative 4 %   Monocytes Absolute 0.4 0.1 - 1.0 K/uL   Eosinophils Relative 1 %   Eosinophils Absolute 0.1 0.0 - 0.5 K/uL   Basophils Relative 1 %   Basophils Absolute 0.1 0.0 - 0.1 K/uL   Immature Granulocytes 0 %   Abs Immature Granulocytes 0.03 0.00 - 0.07 K/uL    Comment: Performed at [facility] [address], [place], [place] [address]  Comprehensive metabolic panel     Status: Abnormal   Collection Time: 03/15/21  5:00 AM  Result Value Ref Range   Sodium 134 (L) 135 - 145 mmol/L    Potassium 6.4 (HH) 3.5 - 5.1 mmol/L    Comment: NO VISIBLE HEMOLYSIS CRITICAL RESULT CALLED TO, READ BACK BY AND VERIFIED WITH: Berline Lopes, RN 0600 03/15/21 A GASKINS    Chloride 89 (L) 98 - 111 mmol/L   CO2 20 (L) 22 - 32 mmol/L   Glucose, Bld 185 (H) 70 - 99 mg/dL    Comment: Glucose reference range applies only to  samples taken after fasting for at least 8 hours.   BUN 82 (H) 8 - 23 mg/dL   Creatinine, Ser 12.97 (H) 0.44 - 1.00 mg/dL   Calcium 9.4 8.9 - 10.3 mg/dL   Total Protein 7.8 6.5 - 8.1 g/dL   Albumin 3.9 3.5 - 5.0 g/dL   AST 19 15 - 41 U/L   ALT 13 0 - 44 U/L   Alkaline Phosphatase 72 38 - 126 U/L   Total Bilirubin 3.7 (H) 0.3 - 1.2 mg/dL   GFR, Estimated 3 (L) >60 mL/min    Comment: (NOTE) Calculated using the CKD-EPI Creatinine Equation (2021)    Anion gap 25 (H) 5 - 15    Comment: Electrolytes repeated to confirm. Performed at [facility], [place] [address], [place], [place] [address]   Lactic acid, plasma     Status: None   Collection Time: 03/15/21  5:00 AM  Result Value Ref Range   Lactic Acid, Venous 1.4 0.5 - 1.9 mmol/L    Comment: Performed at [facility] [address], [place], [place] [address]  Ammonia     Status: None   Collection Time: 03/15/21  5:00 AM  Result Value Ref Range   Ammonia 13 9 - 35 umol/L    Comment: Performed at [facility], [place] [address], [place], [place] [address]  Hemoglobin A1c     Status: None   Collection Time: 03/15/21  5:00 AM  Result Value Ref Range   Hgb A1c MFr Bld 5.2 4.8 - 5.6 %    Comment: (NOTE) Pre diabetes:          5.7%-6.4%  Diabetes:              >6.4%  Glycemic control for   <7.0% adults with diabetes    Mean Plasma Glucose 102.54 mg/dL    Comment: Performed at [facility] [address] [place], [place] [address]  I-stat chem 8, ED (not at [facility] or [facility])     Status: Abnormal   Collection Time: 03/15/21  5:14 AM  Result Value Ref Range   Sodium 131 (L) 135 - 145 mmol/L    Potassium 6.2 (H) 3.5 - 5.1 mmol/L   Chloride 98 98 - 111 mmol/L   BUN 74 (H) 8 - 23 mg/dL   Creatinine, Ser 14.00 (H) 0.44 - 1.00 mg/dL   Glucose, Bld 184 (H) 70 - 99 mg/dL    Comment: Glucose reference range applies only to samples taken after fasting for at least 8 hours.   Calcium, Ion 1.02 (L) 1.15 - 1.40 mmol/L   TCO2 20 (L) 22 - 32 mmol/L   Hemoglobin 15.0 12.0 - 15.0 g/dL   HCT 44.0 36.0 - 46.0 %  MRSA Next Gen by PCR, Nasal     Status: None   Collection Time: 03/15/21  8:25 AM   Specimen: Nasal Mucosa; Nasal Swab  Result Value Ref Range   MRSA by PCR Next Gen NOT DETECTED NOT DETECTED    Comment: (NOTE) The GeneXpert MRSA Assay (FDA approved for NASAL specimens only), is one component of a comprehensive MRSA colonization surveillance program. It is not intended to diagnose MRSA infection nor to guide or monitor treatment for MRSA infections. Test performance is not FDA approved in patients less than 80 years old. Performed at [facility], [place] [address], [place], [place] [address]   CBG monitoring, ED     Status: Abnormal  Collection Time: 03/15/21 11:56 AM  Result Value Ref Range   Glucose-Capillary 153 (H) 70 - 99 mg/dL    Comment: Glucose reference range applies only to samples taken after fasting for at least 8 hours.  Blood gas, arterial     Status: Abnormal   Collection Time: 03/15/21 12:48 PM  Result Value Ref Range   FIO2 80.00    pH, Arterial 7.302 (L) 7.350 - 7.450   pCO2 arterial 36.5 32.0 - 48.0 mmHg   pO2, Arterial 290 (H) 83.0 - 108.0 mmHg   Bicarbonate 17.7 (L) 20.0 - 28.0 mmol/L   Acid-base deficit 7.6 (H) 0.0 - 2.0 mmol/L   O2 Saturation 99.5 %   Patient temperature 36.2    Collection site RIGHT RADIAL    Drawn by [phone]    Sample type ARTERIAL DRAW    Allens test (pass/fail) PASS PASS    Comment: Performed at [facility], [place] [address] [place], [place] [address]  Hepatitis B surface antigen     Status: None   Collection Time:  03/15/21  1:02 PM  Result Value Ref Range   Hepatitis B Surface Ag NON REACTIVE NON REACTIVE    Comment: Performed at [facility] [address] [place], [place] [address]  Hepatitis B surface antibody     Status: Abnormal   Collection Time: 03/15/21  1:03 PM  Result Value Ref Range   Hep B S Ab Reactive (A) NON REACTIVE    Comment: (NOTE) Consistent with immunity, greater than 9.9 mIU/mL.  Performed at [facility], [place] [address], [place], [place] [address]   Hepatitis B surface antibody,quantitative     Status: None   Collection Time: 03/15/21  1:03 PM  Result Value Ref Range   Hepatitis B-Post 66.7 Immunity>9.9 mIU/mL    Comment: (NOTE)  Status of Immunity                     Anti-HBs Level  ------------------                     -------------- Inconsistent with Immunity                   0.0 - 9.9 Consistent with Immunity                          >9.9 Performed At: [facility] [place], [place] [id number] Rush Farmer MD [id number]   CBG monitoring, ED     Status: Abnormal   Collection Time: 03/15/21  5:33 PM  Result Value Ref Range   Glucose-Capillary 106 (H) 70 - 99 mg/dL    Comment: Glucose reference range applies only to samples taken after fasting for at least 8 hours.  Glucose, capillary     Status: Abnormal   Collection Time: 03/15/21  9:12 PM  Result Value Ref Range   Glucose-Capillary 132 (H) 70 - 99 mg/dL    Comment: Glucose reference range applies only to samples taken after fasting for at least 8 hours.  Basic metabolic panel     Status: Abnormal   Collection Time: 03/16/21  6:42 AM  Result Value Ref Range   Sodium 137 135 - 145 mmol/L   Potassium 4.9 3.5 - 5.1 mmol/L    Comment: DELTA CHECK NOTED   Chloride 93 (L) 98 - 111 mmol/L   CO2 26 22 - 32 mmol/L  Glucose, Bld 101 (H) 70 - 99 mg/dL    Comment: Glucose reference range applies only to samples taken after fasting for at least 8 hours.   BUN 33 (H) 8 - 23  mg/dL   Creatinine, Ser 7.35 (H) 0.44 - 1.00 mg/dL    Comment: DELTA CHECK NOTED   Calcium 9.0 8.9 - 10.3 mg/dL   GFR, Estimated 5 (L) >60 mL/min    Comment: (NOTE) Calculated using the CKD-EPI Creatinine Equation (2021)    Anion gap 18 (H) 5 - 15    Comment: Performed at [facility] [address] [place], [place] [address]  CBC     Status: Abnormal   Collection Time: 03/16/21  6:42 AM  Result Value Ref Range   WBC 8.0 4.0 - 10.5 K/uL   RBC 4.13 3.87 - 5.11 MIL/uL   Hemoglobin 11.9 (L) 12.0 - 15.0 g/dL   HCT 37.2 36.0 - 46.0 %   MCV 90.1 80.0 - 100.0 fL   MCH 28.8 26.0 - 34.0 pg   MCHC 32.0 30.0 - 36.0 g/dL   RDW 17.9 (H) 11.5 - 15.5 %   Platelets 270 150 - 400 K/uL   nRBC 0.0 0.0 - 0.2 %    Comment: Performed at [facility], [place] [address], [place], [place] [address]  Glucose, capillary     Status: Abnormal   Collection Time: 03/16/21  6:52 AM  Result Value Ref Range   Glucose-Capillary 100 (H) 70 - 99 mg/dL    Comment: Glucose reference range applies only to samples taken after fasting for at least 8 hours.   Comment 1 Notify RN    Comment 2 Document in Chart   Glucose, capillary     Status: Abnormal   Collection Time: 03/16/21 11:24 AM  Result Value Ref Range   Glucose-Capillary 113 (H) 70 - 99 mg/dL    Comment: Glucose reference range applies only to samples taken after fasting for at least 8 hours.    Current Facility-Administered Medications  Medication Dose Route Frequency Provider Last Rate Last Admin   0.9 %  sodium chloride infusion  100 mL Intravenous PRN Reesa Chew, MD       0.9 %  sodium chloride infusion  100 mL Intravenous PRN Reesa Chew, MD       acetaminophen (TYLENOL) tablet 650 mg  650 mg Oral Q6H PRN Karmen Bongo, MD       Or   acetaminophen (TYLENOL) suppository 650 mg  650 mg Rectal Q6H PRN Karmen Bongo, MD       amLODipine (NORVASC) tablet 5 mg  5 mg Oral Daily Karmen Bongo, MD   5 mg at 03/16/21 0941    asenapine (SAPHRIS) sublingual tablet 5 mg  5 mg Sublingual Q12H PRN Bonnie Roig, MD       atorvastatin (LIPITOR) tablet 10 mg  10 mg Oral QPM Karmen Bongo, MD       azithromycin [facility]) tablet 500 mg  500 mg Oral Daily Pham, Minh Q, RPH-CPP   500 mg at 03/16/21 0941   [START ON 03/18/2021] calcitRIOL (ROCALTROL) capsule 1.5 mcg  1.5 mcg Oral Q M,W,F-HD Roney Jaffe, MD       calcium carbonate (dosed in mg elemental calcium) suspension 500 mg of elemental calcium  500 mg of elemental calcium Oral Q6H PRN Karmen Bongo, MD       camphor-menthol [facility]) lotion 1 application  1 application Topical Y5K PRN Karmen Bongo, MD  And   hydrOXYzine (ATARAX) tablet 25 mg  25 mg Oral Q8H PRN Karmen Bongo, MD       cefTRIAXone (ROCEPHIN) 2 g in sodium chloride 0.9 % 100 mL IVPB  2 g Intravenous Q24H Karmen Bongo, MD 200 mL/hr at 03/16/21 0943 2 g at 03/16/21 0943   Chlorhexidine Gluconate Cloth 2 % PADS 6 each  6 each Topical Q0600 Karmen Bongo, MD   6 each at 03/16/21 0554   docusate sodium (COLACE) capsule 100 mg  100 mg Oral BID Karmen Bongo, MD   100 mg at 03/15/21 2234   docusate sodium (ENEMEEZ) enema 283 mg  1 enema Rectal PRN Karmen Bongo, MD       feeding supplement (NEPRO CARB STEADY) liquid 237 mL  237 mL Oral TID PRN Karmen Bongo, MD       heparin injection 1,000 Units  1,000 Units Dialysis PRN Reesa Chew, MD       heparin injection 5,000 Units  5,000 Units Subcutaneous Camelia Phenes Karmen Bongo, MD   5,000 Units at 03/16/21 3976   hydrALAZINE (APRESOLINE) injection 5 mg  5 mg Intravenous Q4H PRN Karmen Bongo, MD   5 mg at 03/15/21 1849   hydrALAZINE (APRESOLINE) tablet 25 mg  25 mg Oral Lynne Logan, MD   25 mg at 03/16/21 0553   insulin aspart (novoLOG) injection 0-6 Units  0-6 Units Subcutaneous TID WC Karmen Bongo, MD       labetalol (NORMODYNE) tablet 200 mg  200 mg Oral BID Karmen Bongo, MD   200 mg at 03/16/21 0941   lidocaine (PF)  (XYLOCAINE) 1 % injection 5 mL  5 mL Intradermal PRN Reesa Chew, MD       lidocaine-prilocaine (EMLA) cream 1 application  1 application Topical PRN Reesa Chew, MD       lidocaine-prilocaine (EMLA) cream 1 application  1 application Topical Continuous PRN Karmen Bongo, MD       multivitamin (RENA-VIT) tablet 1 tablet  1 tablet Oral Ivery Quale, MD   1 tablet at 03/15/21 2234   ondansetron (ZOFRAN) tablet 4 mg  4 mg Oral Q6H PRN Karmen Bongo, MD       Or   ondansetron [facility]) injection 4 mg  4 mg Intravenous Q6H PRN Karmen Bongo, MD       oxyCODONE (Oxy IR/ROXICODONE) immediate release tablet 5 mg  5 mg Oral Q4H PRN Karmen Bongo, MD       pentafluoroprop-tetrafluoroeth (GEBAUERS) aerosol 1 application  1 application Topical PRN Reesa Chew, MD       sodium chloride flush (NS) 0.9 % injection 3 mL  3 mL Intravenous Q12H Karmen Bongo, MD   3 mL at 03/16/21 0944   sorbitol 70 % solution 30 mL  30 mL Oral PRN Karmen Bongo, MD       zolpidem Lorrin Mais) tablet 5 mg  5 mg Oral QHS PRN Karmen Bongo, MD   5 mg at 03/16/21 0046    Musculoskeletal: Strength & Muscle Tone: within normal limits Gait & Station:  not assessed Patient leans: N/A      Psychiatric Specialty Exam:  Presentation  General Appearance: Appropriate for Environment  Eye Contact:Good  Speech:Clear and Coherent  Speech Volume:Normal  Handedness:Right   Mood and Affect  Mood:Euthymic  Affect:Appropriate   Thought Process  Thought Processes:Coherent  Descriptions of Associations:Intact  Orientation:Full (Time, Place and Person)  Thought Content:Logical  History of Schizophrenia/Schizoaffective disorder:No data recorded Duration of Psychotic  Symptoms:No data recorded Hallucinations:Hallucinations: None  Ideas of Reference:None  Suicidal Thoughts:Suicidal Thoughts: No  Homicidal Thoughts:No data recorded  Sensorium  Memory:Immediate Fair; Recent  Fair  Judgment:Fair  Insight:Fair   Executive Functions  Concentration:Fair  Attention Span:Fair  [place]   Psychomotor Activity  Psychomotor Activity:Psychomotor Activity: Increased   Assets  Assets:Communication Skills; Desire for Improvement   Sleep  Sleep:Sleep: Fair   Physical Exam: Physical Exam Review of Systems  Psychiatric/Behavioral:  The patient is nervous/anxious and has insomnia.   Blood pressure (!) 158/83, pulse 67, temperature 98.7 F (37.1 C), temperature source Oral, resp. rate 20, height 5\' 7"  (1.702 m), weight 74.1 kg, SpO2 94 %. Body mass index is 25.59 kg/m.  Treatment Plan Summary: 76 y.o. female with PMH of ESRD on HD MWF,DM,HTN, depression presenting with AMS, weakness after missing a day of dialysis and stressed out due to Pneumonia and Shingle infection. Psychiatric consult was initiated due to agitation. However, patient is now alert, awake, cooperative without agitation. No further psychiatric intervention needed at this point.  Recommendations: -Consider Asenapine 5 mg S/L every 12 hrs as needed for agitation/psychosis -Judicious treatment of underline medical issues such as Shingles, pneumonia recommended -Patient's son counseled on the need to avoid patient missing dialysis treatment in future   Disposition: No evidence of imminent risk to self or others at present.   Supportive therapy provided about ongoing stressors. Psychiatric service signing off. Re-consult as needed  Corena Pilgrim, MD 03/16/2021 12:57 PM

## 2021-03-16 NOTE — Progress Notes (Signed)
PROGRESS NOTE    Keiandra Sullenger  YBO:[id number] DOB: 03-14-1945 DOA: 03/15/2021 PCP: [name], MD   Chief Complaint  Patient presents with   Altered Mental Status   Weakness  Brief Narrative/Hospital Course: [name], 76 y.o. female with PMH of ESRD on HD MWF,DM,HTN, depression presenting with AMS. She was treated outpatient for pneumonia 10 days ago, continues to have cough and SOB, was seen again for left flank pain a couple days and started on gabapentin and valacyclovir for suspected zoster without rash, and has since developed worsening speech and cognitive disturbance. She was last dialyzed on Monday. ED workup notable for potassium 6.4, BUN 82, and suspected pneumonia.  Still no rash per ED physician. Nephrology was consulted by ED and she was started on vancomycin and Zosyn   Subjective: Seen and examined RN at bedside Pt is alert awake oriented to self place, wants to talk to her son and asking for her phone Overnight no fever blood pressure 180s to 220s on 2 L nasal cannula CBC stable Later on patient was agitated.  Assessment & Plan: * Acute metabolic encephalopathy- (present on admission) she was treated for pneumonia with azithromycin last week, then return to PCP for left flank pain, possible shingles and was placed on gabapentin and valacyclovir following which she had altered mental status.  Suspecting acute toxic metabolic encephalopathy in the setting of polypharmacy in the setting of missed HD.  This morning she is alert awake fairly oriented but mildly agitated reports at times she sees people in the room.  Continue HD per nephrology continue supportive care, consult psych continue delirium precaution supportive care fall precaution, Lexapro on hold.   Pneumonia- (present on admission) Imaging- LLL bronchopneumonia,nodular opacity in the LUL that may be contributing to her presentation.  Continue ceftriaxone/azithromycin for community-acquired  pneumonia.  She will need CT chest to further evaluate the lung lesion ( son aware).  End-stage renal disease on hemodialysis [facility]) On HD MWF.missed Wednesday session, which may have increased her risk of medication reaction, nephro consulted-had HD 1/13.  Dyslipidemia- (present on admission) On Lipitor  Diabetes mellitus (HCC) 1c stable 5.2, blood sugar controlled, continue on sliding scale insulin.  Not on meds at home. Recent Labs  Lab 03/15/21 0500 03/15/21 1156 03/15/21 1733 03/15/21 2112 03/16/21 0652  GLUCAP  --  153* 106* 132* 100*  HGBA1C 5.2  --   --   --   --     Essential hypertension- (present on admission) BP poorly controlled, continue home Labetalol, hydralazine, Norvasc, prn iv IV hydralazine.  Hopefully dialysis will help  Anemia of chronic kidney failure, stage 5 (Casey)- (present on admission) Hemoglobin appears stable and normal. Recent Labs  Lab 03/15/21 0500 03/15/21 0514 03/16/21 0642  HGB 13.0 15.0 11.9*  HCT 40.0 44.0 37.2    DVT prophylaxis: heparin injection 5,000 Units Start: 03/15/21 1400 Code Status:   Code Status: Full Code Family Communication: plan of care discussed with patient at bedside. Status is: Inpatient Remains inpatient appropriate because: Due to ONGOING altered mental status, ESRD on HD Disposition: Currently not medically stable for discharge. Anticipated Disposition: TBD.  PT OT evaluation for disposition  Total time spent in the care of this patient  50 MINUTES Objective: Vitals last 24 hrs: Vitals:   03/16/21 0000 03/16/21 0534 03/16/21 0911 03/16/21 1053  BP: (!) 139/93 (!) 181/74 (!) 144/118 (!) 158/83  Pulse: 70 72 74 67  Resp:  17 20   Temp:  98.7 F (37.1  C) 98.7 F (37.1 C)   TempSrc:  Oral Oral   SpO2:  100% 94%   Weight:      Height:       Weight change: -0.075 kg  Intake/Output Summary (Last 24 hours) at 03/16/2021 1108 Last data filed at 03/16/2021 1104 Gross per 24 hour  Intake 574.93 ml  Output  3000 ml  Net -2425.07 ml   Net IO Since Admission: -2,073.49 mL [03/16/21 1108]   Physical Examination: General exam: AAOx 2, moving head to left and rt HEENT:Oral mucosa moist, Ear/Nose WNL grossly, dentition normal. Respiratory system: bilaterally clear, no use of accessory muscle Cardiovascular system: S1 & S2 +, No JVD,. Gastrointestinal system: Abdomen soft, NT,ND, BS+ Nervous System:Alert, awake, moving  b/l UE and LE and no focal Extremities: No edema, distal peripheral pulses palpable.  Skin: No rashes,no icterus. MSK: Normal muscle bulk,tone, power.   Medications reviewed: Scheduled Meds:  amLODipine  5 mg Oral Daily   atorvastatin  10 mg Oral QPM   azithromycin  500 mg Oral Daily   [START ON 03/18/2021] calcitRIOL  1.5 mcg Oral Q M,W,F-HD   Chlorhexidine Gluconate Cloth  6 each Topical Q0600   docusate sodium  100 mg Oral BID   heparin  5,000 Units Subcutaneous Q8H   hydrALAZINE  25 mg Oral Q8H   insulin aspart  0-6 Units Subcutaneous TID WC   labetalol  200 mg Oral BID   multivitamin  1 tablet Oral QHS   sodium chloride flush  3 mL Intravenous Q12H   Continuous Infusions:  sodium chloride     sodium chloride     cefTRIAXone (ROCEPHIN)  IV 2 g (03/16/21 0943)   lidocaine-prilocaine     Diet Order             Diet renal with fluid restriction Fluid restriction: 1200 mL Fluid; Room service appropriate? Yes; Fluid consistency: Thin  Diet effective ____                 Weight change: -0.075 kg  Wt Readings from Last 3 Encounters:  03/15/21 74.1 kg  12/26/19 74.8 kg  07/28/19 77.1 kg     Consultants:see note  Procedures:see note Antimicrobials: Anti-infectives (From admission, onward)    Start     Dose/Rate Route Frequency Ordered Stop   03/16/21 1000  azithromycin (ZITHROMAX) tablet 500 mg        500 mg Oral Daily 03/16/21 0753 03/20/21 0959   03/15/21 1000  cefTRIAXone (ROCEPHIN) 2 g in sodium chloride 0.9 % 100 mL IVPB        2 g 200 mL/hr over 30  Minutes Intravenous Every 24 hours 03/15/21 0946 03/20/21 0959   03/15/21 1000  azithromycin (ZITHROMAX) 500 mg in sodium chloride 0.9 % 250 mL IVPB  Status:  Discontinued        500 mg 250 mL/hr over 60 Minutes Intravenous Every 24 hours 03/15/21 0946 03/16/21 0753   03/15/21 0630  vancomycin (VANCOREADY) IVPB 1500 mg/300 mL        1,500 mg 150 mL/hr over 120 Minutes Intravenous  Once 03/15/21 0621 03/15/21 1030   03/15/21 0630  piperacillin-tazobactam (ZOSYN) IVPB 3.375 g        3.375 g 100 mL/hr over 30 Minutes Intravenous  Once 03/15/21 0621 03/15/21 0817      Culture/Microbiology    Component Value Date/Time   SDES BLOOD RIGHT ANTECUBITAL 06/07/2019 2304   SPECREQUEST  06/07/2019 2304    BOTTLES DRAWN AEROBIC  AND ANAEROBIC Blood Culture adequate volume   CULT  06/07/2019 2304    NO GROWTH 5 DAYS Performed at [facility], [place] [address], [place], [place] [address]    REPTSTATUS 06/13/2019 FINAL 06/07/2019 2304    Other culture-see note  Unresulted Labs (From admission, onward)     Start     Ordered   03/17/21 9735  Basic metabolic panel  Daily,   R     Question:  Specimen collection method  Answer:  Lab=Lab collect   03/16/21 1106   03/15/21 0624  Blood culture (routine x 2)  BLOOD CULTURE X 2,   STAT      03/15/21 0623   03/15/21 0453  Urinalysis, Routine w reflex microscopic  Once,   STAT        03/15/21 0452   03/15/21 0453  Rapid urine drug screen (hospital performed)  ONCE - STAT,   STAT        03/15/21 0452          Data Reviewed: I have personally reviewed following labs and imaging studies CBC: Recent Labs  Lab 03/15/21 0500 03/15/21 0514 03/16/21 0642  WBC 9.6  --  8.0  NEUTROABS 7.9*  --   --   HGB 13.0 15.0 11.9*  HCT 40.0 44.0 37.2  MCV 91.7  --  90.1  PLT 293  --  329   Basic Metabolic Panel: Recent Labs  Lab 03/15/21 0500 03/15/21 0514 03/16/21 0642  NA 134* 131* 137  K 6.4* 6.2* 4.9  CL 89* 98 93*  CO2 20*  --  26  GLUCOSE  185* 184* 101*  BUN 82* 74* 33*  CREATININE 12.97* 14.00* 7.35*  CALCIUM 9.4  --  9.0   GFR: Estimated Creatinine Clearance: 7 mL/min (A) (by C-G formula based on SCr of 7.35 mg/dL (H)). Liver Function Tests: Recent Labs  Lab 03/15/21 0500  AST 19  ALT 13  ALKPHOS 72  BILITOT 3.7*  PROT 7.8  ALBUMIN 3.9   No results for input(s): LIPASE, AMYLASE in the last 168 hours. Recent Labs  Lab 03/15/21 0500  AMMONIA 13   Coagulation Profile: No results for input(s): INR, PROTIME in the last 168 hours. Cardiac Enzymes: No results for input(s): CKTOTAL, CKMB, CKMBINDEX, TROPONINI in the last 168 hours. BNP (last 3 results) No results for input(s): PROBNP in the last 8760 hours. HbA1C: Recent Labs    03/15/21 0500  HGBA1C 5.2   CBG: Recent Labs  Lab 03/15/21 1156 03/15/21 1733 03/15/21 2112 03/16/21 0652  GLUCAP 153* 106* 132* 100*   Lipid Profile: No results for input(s): CHOL, HDL, LDLCALC, TRIG, CHOLHDL, LDLDIRECT in the last 72 hours. Thyroid Function Tests: No results for input(s): TSH, T4TOTAL, FREET4, T3FREE, THYROIDAB in the last 72 hours. Anemia Panel: No results for input(s): VITAMINB12, FOLATE, FERRITIN, TIBC, IRON, RETICCTPCT in the last 72 hours. Sepsis Labs: Recent Labs  Lab 03/15/21 0500  LATICACIDVEN 1.4    Recent Results (from the past 240 hour(s))  Resp Panel by RT-PCR (Flu A&B, Covid) Nasopharyngeal Swab     Status: None   Collection Time: 03/15/21  4:52 AM   Specimen: Nasopharyngeal Swab; Nasopharyngeal(NP) swabs in vial transport medium  Result Value Ref Range Status   SARS Coronavirus 2 by RT PCR NEGATIVE NEGATIVE Final    Comment: (NOTE) SARS-CoV-2 target nucleic acids are NOT DETECTED.  The SARS-CoV-2 RNA is generally detectable in upper respiratory specimens during the acute phase of infection. The  lowest concentration of SARS-CoV-2 viral copies this assay can detect is 138 copies/mL. A negative result does not preclude  SARS-Cov-2 infection and should not be used as the sole basis for treatment or other patient management decisions. A negative result may occur with  improper specimen collection/handling, submission of specimen other than nasopharyngeal swab, presence of viral mutation(s) within the areas targeted by this assay, and inadequate number of viral copies(<138 copies/mL). A negative result must be combined with clinical observations, patient history, and epidemiological information. The expected result is Negative.  Fact Sheet for Patients:  EntrepreneurPulse.com.au  Fact Sheet for Healthcare Providers:  IncredibleEmployment.be  This test is no t yet approved or cleared by the [place] FDA and  has been authorized for detection and/or diagnosis of SARS-CoV-2 by FDA under an Emergency Use Authorization (EUA). This EUA will remain  in effect (meaning this test can be used) for the duration of the COVID-19 declaration under Section 564(b)(1) of the Act, 21 U.S.C.section 360bbb-3(b)(1), unless the authorization is terminated  or revoked sooner.       Influenza A by PCR NEGATIVE NEGATIVE Final   Influenza B by PCR NEGATIVE NEGATIVE Final    Comment: (NOTE) The Xpert Xpress SARS-CoV-2/FLU/RSV plus assay is intended as an aid in the diagnosis of influenza from Nasopharyngeal swab specimens and should not be used as a sole basis for treatment. Nasal washings and aspirates are unacceptable for Xpert Xpress SARS-CoV-2/FLU/RSV testing.  Fact Sheet for Patients: EntrepreneurPulse.com.au  Fact Sheet for Healthcare Providers: IncredibleEmployment.be  This test is not yet approved or cleared by the [place] FDA and has been authorized for detection and/or diagnosis of SARS-CoV-2 by FDA under an Emergency Use Authorization (EUA). This EUA will remain in effect (meaning this test can be used) for the duration of  the COVID-19 declaration under Section 564(b)(1) of the Act, 21 U.S.C. section 360bbb-3(b)(1), unless the authorization is terminated or revoked.  Performed at [facility], [place] [address], [place], [place] [address]   MRSA Next Gen by PCR, Nasal     Status: None   Collection Time: 03/15/21  8:25 AM   Specimen: Nasal Mucosa; Nasal Swab  Result Value Ref Range Status   MRSA by PCR Next Gen NOT DETECTED NOT DETECTED Final    Comment: (NOTE) The GeneXpert MRSA Assay (FDA approved for NASAL specimens only), is one component of a comprehensive MRSA colonization surveillance program. It is not intended to diagnose MRSA infection nor to guide or monitor treatment for MRSA infections. Test performance is not FDA approved in patients less than 65 years old. Performed at [facility], [place] [address], [place], [place] [address]      Radiology Studies: CT Head Wo Contrast  Result Date: 03/15/2021 CLINICAL DATA:  76 year old female with history of mental status change. EXAM: CT HEAD WITHOUT CONTRAST TECHNIQUE: Contiguous axial images were obtained from the base of the skull through the vertex without intravenous contrast. RADIATION DOSE REDUCTION: This exam was performed according to the departmental dose-optimization program which includes automated exposure control, adjustment of the mA and/or kV according to patient size and/or use of iterative reconstruction technique. COMPARISON:  Head CT 12/18/2019. FINDINGS: Brain: Mild cerebral atrophy. Patchy and confluent areas of decreased attenuation are noted throughout the deep and periventricular white matter of the cerebral hemispheres bilaterally, compatible with chronic microvascular ischemic disease. No evidence of acute infarction, hemorrhage, hydrocephalus, extra-axial collection or mass lesion/mass effect. Vascular: No hyperdense vessel or unexpected calcification. Skull:  Normal. Negative for fracture or focal lesion. Sinuses/Orbits:  No acute finding. Other: None. IMPRESSION: 1. No acute intracranial abnormalities. 2. Mild cerebral atrophy with extensive chronic microvascular ischemic changes in the cerebral white matter, as above. Electronically Signed   By: Vinnie Langton M.D.   On: 03/15/2021 06:05   DG Chest Portable 1 View  Result Date: 03/15/2021 CLINICAL DATA:  76 year old female with history of altered mental status. EXAM: PORTABLE CHEST 1 VIEW COMPARISON:  Chest x-ray 03/06/2021. FINDINGS: Again noted are widespread areas of interstitial prominence and chronic regions of architectural distortion, most evident throughout the mid to upper lungs bilaterally, similar to prior examinations. However, there is increasing conspicuity of a nodular area of architectural distortion in the periphery of the left upper lobe estimated to measure 1.1 x 0.8 cm on today's examination. Diffuse peribronchial cuffing. Patchy ill-defined opacities are also increasingly conspicuous, particularly in the left mid to lower lung, concerning for potential developing bronchopneumonia. No pleural effusions. No pneumothorax. No evidence of pulmonary edema. Heart size is mildly enlarged. The patient is rotated to the right on today's exam, resulting in distortion of the mediastinal contours and reduced diagnostic sensitivity and specificity for mediastinal pathology. IMPRESSION: 1. Findings are concerning for bronchitis and developing bronchopneumonia in the left lung base. 2. Increasingly conspicuous nodular density in the periphery of the left upper lobe. If there is clinical concern for neoplasm, further evaluation with noncontrast chest CT would be recommended at this time. At the very least, repeat standing PA and lateral chest radiograph would be recommended in 2 months to ensure the stability or regression of this finding. 3. Chronic scarring in the lungs bilaterally, most evident throughout the mid to upper lungs, similar to prior examinations. 4. Mild  cardiomegaly. Electronically Signed   By: Vinnie Langton M.D.   On: 03/15/2021 06:42     LOS: 1 day   Antonieta Pert, MD [facility]  03/16/2021, 11:08 AM

## 2021-03-16 NOTE — Evaluation (Addendum)
Physical Therapy Evaluation Patient Details Name: Bianca Myers MRN: [id number] DOB: Dec 14, 1945 Today's Date: 03/16/2021  History of Present Illness  Pt is a 76 y.o. F who presents 03/15/2021 with AMS and cough shortly after she was put on a medication for shingles. Significant PMH: anemia, ESRD on HD, gout, HTN.  Clinical Impression  Pt admitted with above. Pt alert and oriented to self only. Displays restless and impulsive behaviors, but no agitation noted this session. Follows one step commands inconsistently. Pt requiring up to two person moderate assist to stand from edge of bed. Has ataxia in all  four extremities and is unable to ambulate. Pt demonstrates decreased gross motor coordination, weakness, poor sitting and standing balance and cognitive impairments. Will continue to reassess for discharge planning and needs.      Recommendations for follow up therapy are one component of a multi-disciplinary discharge planning process, led by the attending physician.  Recommendations may be updated based on patient status, additional functional criteria and insurance authorization.  Follow Up Recommendations Skilled nursing-short term rehab (<3 hours/day) (if mental status improves significantly, may be able to progress home)    Assistance Recommended at Discharge Frequent or constant Supervision/Assistance  Patient can return home with the following  Two people to help with walking and/or transfers;A lot of help with bathing/dressing/bathroom    Equipment Recommendations None recommended by PT  Recommendations for Other Services       Functional Status Assessment Patient has had a recent decline in their functional status and demonstrates the ability to make significant improvements in function in a reasonable and predictable amount of time.     Precautions / Restrictions Precautions Precautions: Fall Restrictions Weight Bearing Restrictions: No      Mobility  Bed  Mobility Overal bed mobility: Needs Assistance Bed Mobility: Supine to Sit;Sit to Supine;Rolling Rolling: Mod assist   Supine to sit: Mod assist Sit to supine: Mod assist   General bed mobility comments: Pt initiating, but has difficulty sequencing    Transfers Overall transfer level: Needs assistance Equipment used: 2 person hand held assist Transfers: Sit to/from Stand Sit to Stand: Mod assist;+2 physical assistance           General transfer comment: ModA + 2 to rise from edge of bed, pt initiating, but bracing heavily posteriorly. Pt with BLE jerking movements edge of bed, causing knee buckle. Unable to take steps and assisted back into sitting position    Ambulation/Gait                  Stairs            Wheelchair Mobility    Modified Rankin (Stroke Patients Only)       Balance Overall balance assessment: Needs assistance Sitting-balance support: Feet supported Sitting balance-Leahy Scale: Poor Sitting balance - Comments: min guard assist   Standing balance support: Bilateral upper extremity supported Standing balance-Leahy Scale: Zero                               Pertinent Vitals/Pain Pain Assessment: Faces Faces Pain Scale: No hurt    Home Living Family/patient expects to be discharged to:: Private residence Living Arrangements: Children (son) Available Help at Discharge: Family Type of Home: House Home Access: Stairs to enter Entrance Stairs-Rails: None Entrance Stairs-Number of Steps: 2   Home Layout: Able to live on main level with bedroom/bathroom Home Equipment: Rolling Walker (2 wheels);BSC/3in1;Cane - single point;Shower  seat;Wheelchair - manual Additional Comments: Information taken from chart in 2021    Prior Function Prior Level of Function : Needs assist             Mobility Comments: using Rollator in 2021 (unsure of current baseline)       Hand Dominance        Extremity/Trunk Assessment    Upper Extremity Assessment Upper Extremity Assessment: Defer to OT evaluation    Lower Extremity Assessment Lower Extremity Assessment: RLE deficits/detail;LLE deficits/detail RLE Coordination: decreased gross motor LLE Coordination: decreased gross motor       Communication   Communication: No difficulties  Cognition Arousal/Alertness: Awake/alert Behavior During Therapy: Restless;Agitated;Impulsive Overall Cognitive Status: Impaired/Different from baseline Area of Impairment: Orientation;Attention;Memory;Following commands;Safety/judgement;Awareness                 Orientation Level: Disoriented to;Place;Time;Situation Current Attention Level: Focused Memory: Decreased short-term memory Following Commands: Follows one step commands inconsistently Safety/Judgement: Decreased awareness of safety;Decreased awareness of deficits Awareness: Intellectual   General Comments: Pt alert, easily distracted, impulsive and restless. Pt perseverating on stating, "why am I here?" and is not able to be re-oriented. Will also echo what PT says. Pt follows < 50% of simple motor commands.        General Comments General comments (skin integrity, edema, etc.): HR 70's, on 2L O2    Exercises     Assessment/Plan    PT Assessment Patient needs continued PT services  PT Problem List Decreased strength;Decreased activity tolerance;Decreased balance;Decreased coordination;Decreased mobility;Decreased cognition;Decreased safety awareness       PT Treatment Interventions DME instruction;Gait training;Functional mobility training;Therapeutic activities;Therapeutic exercise;Balance training;Patient/family education    PT Goals (Current goals can be found in the Care Plan section)  Acute Rehab PT Goals Patient Stated Goal: unable PT Goal Formulation: Patient unable to participate in goal setting Time For Goal Achievement: 03/30/21 Potential to Achieve Goals: Fair    Frequency Min  3X/week     Co-evaluation               AM-PAC PT "6 Clicks" Mobility  Outcome Measure Help needed turning from your back to your side while in a flat bed without using bedrails?: A Lot Help needed moving from lying on your back to sitting on the side of a flat bed without using bedrails?: A Lot Help needed moving to and from a bed to a chair (including a wheelchair)?: A Lot Help needed standing up from a chair using your arms (e.g., wheelchair or bedside chair)?: Total Help needed to walk in hospital room?: Total Help needed climbing 3-5 steps with a railing? : Total 6 Click Score: 9    End of Session Equipment Utilized During Treatment: Gait belt Activity Tolerance: Patient tolerated treatment well Patient left: in bed;with call bell/phone within reach;with bed alarm set Nurse Communication: Mobility status (may benefit from sacral foam) PT Visit Diagnosis: Unsteadiness on feet (R26.81);Other abnormalities of gait and mobility (R26.89);Difficulty in walking, not elsewhere classified (R26.2)    Time: 0912-0928 PT Time Calculation (min) (ACUTE ONLY): 16 min   Charges:   PT Evaluation $PT Eval Moderate Complexity: 1 Mod          Wyona Almas, PT, DPT Acute Rehabilitation Services Pager [phone] Office [phone]   Deno Etienne 03/16/2021, 11:41 AM

## 2021-03-16 NOTE — Progress Notes (Signed)
Subjective: Seen in room, with son present, patient with altered MS but improved, no agitation when I was present and she recognizes me from [facility].   Objective Vital signs in last 24 hours: Vitals:   03/16/21 0000 03/16/21 0534 03/16/21 0911 03/16/21 1053  BP: (!) 139/93 (!) 181/74 (!) 144/118 (!) 158/83  Pulse: 70 72 74 67  Resp:  17 20   Temp:  98.7 F (37.1 C) 98.7 F (37.1 C)   TempSrc:  Oral Oral   SpO2:  100% 94%   Weight:      Height:       Weight change: -0.075 kg  Physical Exam: General: Elderly female lethargic but awakens to voice and conservative, NAD, pleasantly confused Heart: RRR no MRG Lungs: CTA, nonlabored breathing Abdomen: NABS, S, NT ND no ascites Extremities: No lower extremity edema Dialysis Access: Positive bruit L UA AV fistula   Home meds include - norvasc, lipitor, lexapro, auryxia, hydralazine, labetalol, rena-vit, nepro, vits/supps/ prns    OP HD: MWF   3h  75kg  2/2 bath  LUA AVG  Hep 2000   - calcitriol 1.5 ug tiw   - mircera 200 q2, last 12/28  Problem/Plan: AMS -in setting of 1 missed HD and ESRD pt recently prescribed Valtrex (son unaware of renally dosed) and gabapentin(also not sure of dosing) = COMMON  Cause of AMS when given to ESRD pt's at the non-ESRD dose.  Has improved some since dialysis, none of this is permanent. Valtrex takes at least 2-3 days to wash out  sometimes longer.  Neurontin effects are not as long. Also meds plus infectious process from pneumonia in ESRD patient Possible PNA - getting IV abx for suspected CAP, per pmd.  ESRD - on HD MWF.  HD yesterday Friday 01/13 .  Labs predialysis BUN 82 CR 14. K 6.4 missed OP HD Wed.  Today a.m. labs K4.9 BUN 33 CR 7.35, next dialysis Monday on schedule BP/ volume - on multiple BP lowering meds. Reintroduce here as needed. Not vol overloaded on exam. CXR w/ LLL changes, scarring upper lobes.  Did not tolerate yesterday large UF large UF on HD.  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 Recent Labs  Lab 03/15/21 0500  AMMONIA 13   CBC: Recent Labs  Lab 03/15/21 0500 03/15/21 0514 03/16/21 0642  WBC 9.6  --  8.0  NEUTROABS 7.9*  --   --   HGB 13.0 15.0 11.9*  HCT 40.0 44.0 37.2  MCV 91.7  --  90.1  PLT 293  --  270   Cardiac Enzymes: No results for input(s): CKTOTAL, CKMB, CKMBINDEX, TROPONINI in the last 168 hours.  CBG: Recent Labs  Lab 03/15/21 1156 03/15/21 1733 03/15/21 2112 03/16/21 0652 03/16/21 1124  GLUCAP 153* 106* 132* 100* 113*    Studies/Results: CT Head Wo Contrast  Result Date: 03/15/2021 CLINICAL DATA:  76 year old female with history of mental status change. EXAM: CT HEAD WITHOUT CONTRAST TECHNIQUE: Contiguous axial images were obtained from the base of the skull through the vertex without intravenous contrast. RADIATION DOSE REDUCTION: This exam was performed according to the departmental dose-optimization program which includes automated exposure control, adjustment of the mA and/or kV according to patient size and/or use of iterative reconstruction technique. COMPARISON:  Head CT 12/18/2019. FINDINGS: Brain: Mild cerebral atrophy. Patchy and confluent areas of decreased attenuation are noted throughout the deep and periventricular white matter of the cerebral hemispheres  bilaterally, compatible with chronic microvascular ischemic disease. No evidence of acute infarction, hemorrhage, hydrocephalus, extra-axial collection or mass lesion/mass effect. Vascular: No hyperdense vessel or unexpected calcification. Skull: Normal. Negative for fracture or focal lesion. Sinuses/Orbits: No acute finding. Other: None. IMPRESSION: 1. No acute intracranial abnormalities. 2. Mild cerebral atrophy with extensive chronic microvascular ischemic changes in the cerebral white matter, as above. Electronically Signed   By: Vinnie Langton M.D.   On: 03/15/2021 06:05   DG Chest Portable 1 View  Result Date: 03/15/2021 CLINICAL DATA:  76 year old female with history of altered mental status. EXAM: PORTABLE CHEST 1 VIEW COMPARISON:  Chest x-ray 03/06/2021. FINDINGS: Again noted are widespread areas of interstitial prominence and chronic regions of architectural distortion, most evident throughout the mid to upper lungs bilaterally, similar to prior examinations. However, there is increasing conspicuity of a nodular area of architectural distortion in the periphery of the left upper lobe estimated to measure 1.1 x 0.8 cm on today's examination. Diffuse peribronchial cuffing. Patchy ill-defined opacities are also increasingly conspicuous, particularly in the left mid to lower lung, concerning for potential developing bronchopneumonia. No pleural effusions. No pneumothorax. No evidence of pulmonary edema. Heart size is mildly enlarged. The patient is rotated to the right on today's exam, resulting in distortion of the mediastinal contours and reduced diagnostic sensitivity and specificity for mediastinal pathology. IMPRESSION: 1. Findings are concerning for bronchitis and developing bronchopneumonia in the left lung base. 2. Increasingly conspicuous nodular density in the periphery of the left upper lobe. If there is clinical concern for neoplasm, further evaluation with noncontrast chest CT would be  recommended at this time. At the very least, repeat standing PA and lateral chest radiograph would be recommended in 2 months to ensure the stability or regression of this finding. 3. Chronic scarring in the lungs bilaterally, most evident throughout the mid to upper lungs, similar to prior examinations. 4. Mild cardiomegaly. Electronically Signed   By: Vinnie Langton M.D.   On: 03/15/2021 06:42   Medications:  sodium chloride     sodium chloride     cefTRIAXone (ROCEPHIN)  IV 2 g (03/16/21 0943)   lidocaine-prilocaine      amLODipine  5 mg Oral Daily   atorvastatin  10 mg Oral QPM   azithromycin  500 mg Oral Daily   [START ON 03/18/2021] calcitRIOL  1.5 mcg Oral Q M,W,F-HD   Chlorhexidine Gluconate Cloth  6 each Topical Q0600   docusate sodium  100 mg Oral BID   heparin  5,000 Units Subcutaneous Q8H   hydrALAZINE  25 mg Oral Q8H   insulin aspart  0-6 Units Subcutaneous TID WC   labetalol  200 mg Oral BID   multivitamin  1 tablet  Oral QHS   sodium chloride flush  3 mL Intravenous Q12H

## 2021-03-16 NOTE — Progress Notes (Signed)
With medication administration, noticed patient pockets food and meds requiring encouragement to swallow.

## 2021-03-16 NOTE — Progress Notes (Signed)
PHARMACIST - PHYSICIAN COMMUNICATION DR:   Lupita Leash CONCERNING: Antibiotic IV to Oral Route Change Policy  RECOMMENDATION: This patient is receiving azithromycin by the intravenous route.  Based on criteria approved by the Pharmacy and Therapeutics Committee, the antibiotic(s) is/are being converted to the equivalent oral dose form(s).   DESCRIPTION: These criteria include: Patient being treated for a respiratory tract infection, urinary tract infection, cellulitis or clostridium difficile associated diarrhea if on metronidazole The patient is not neutropenic and does not exhibit a GI malabsorption state The patient is eating (either orally or via tube) and/or has been taking other orally administered medications for a least 24 hours The patient is improving clinically and has a Tmax < 100.5  If you have questions about this conversion, please contact the Pharmacy Department  []   [phone] )  [facility] []   [phone] )  [facility] [place] [x]   [phone] )  [facility] []   [phone] )  [facility] []   [phone] )  [facility]

## 2021-03-16 NOTE — Evaluation (Signed)
Clinical/Bedside Swallow Evaluation Patient Details  Name: Nerea Bordenave MRN: [id number] Date of Birth: 11-Jan-1946  Today's Date: 03/16/2021 Time: SLP Start Time (ACUTE ONLY): 59 SLP Stop Time (ACUTE ONLY): 1328 SLP Time Calculation (min) (ACUTE ONLY): 10 min  Past Medical History:  Past Medical History:  Diagnosis Date   Anemia    Arthritis    Depression    Diabetes mellitus ([place])    Dyslipidemia 03/15/2021   ESRD (end stage renal disease) on dialysis [facility])    Dialysis M/W/F   Gout    History of anemia due to chronic kidney disease    History of degenerative disc disease    Hypertension    Pneumonia    Refusal of blood transfusions as patient is Jehovah's Witness    Secondary hyperparathyroidism ([place])    Spondylisthesis    Past Surgical History:  Past Surgical History:  Procedure Laterality Date   A/V FISTULAGRAM Left 12/16/2018   Procedure: A/V FISTULAGRAM;  Surgeon: Marty Heck, MD;  Location: [place] [facility];  Service: Cardiovascular;  Laterality: Left;   A/V FISTULAGRAM Left 06/13/2019   Procedure: A/V FISTULAGRAM;  Surgeon: Marty Heck, MD;  Location: [place] [facility];  Service: Cardiovascular;  Laterality: Left;   AV FISTULA PLACEMENT Left 08/24/2018   Procedure: ARTERIOVENOUS (AV) FISTULA CREATION;  Surgeon: Waynetta Sandy, MD;  Location: [place];  Service: Vascular;  Laterality: Left;   AV FISTULA PLACEMENT Left 07/12/2019   Procedure: INSERTION OF LEFT UPPER ARM  ARTERIOVENOUS (AV) GORE-TEX GRAFT;  Surgeon: Elam Dutch, MD;  Location: [place];  Service: Vascular;  Laterality: Left;   [place] Left 11/04/2018   Procedure: [place];  Surgeon: Waynetta Sandy, MD;  Location: [place];  Service: Vascular;  Laterality: Left;   COLONOSCOPY     HIP SURGERY     INSERTION OF DIALYSIS CATHETER N/A 06/15/2019   Procedure: INSERTION OF TUNNELED  DIALYSIS CATHETER RIGHT INTERNAL  JUGULAR;  Surgeon: Serafina Mitchell, MD;  Location: [facility];  Service: Vascular;  Laterality: N/A;   IR FLUORO GUIDE CV LINE RIGHT  08/21/2018   IR FLUORO GUIDE CV LINE RIGHT  06/08/2019   IR PERC TUN PERIT CATH WO PORT S&I /IMAG  12/19/2019   IR REMOVAL TUN CV CATH W/O FL  03/11/2019   IR THROMBECTOMY AV FISTULA W/THROMBOLYSIS/PTA INC/SHUNT/IMG LEFT Left 12/21/2019   IR US GUIDE VASC ACCESS LEFT  12/21/2019   IR US GUIDE VASC ACCESS RIGHT  08/21/2018   IR US GUIDE VASC ACCESS RIGHT  06/08/2019   IR US GUIDE VASC ACCESS RIGHT  12/19/2019   PERIPHERAL VASCULAR BALLOON ANGIOPLASTY Left 06/13/2019   Procedure: PERIPHERAL VASCULAR BALLOON ANGIOPLASTY;  Surgeon: Marty Heck, MD;  Location: [place] [facility];  Service: Cardiovascular;  Laterality: Left;  AVF   PERIPHERAL VASCULAR INTERVENTION Left 12/16/2018   Procedure: PERIPHERAL VASCULAR INTERVENTION;  Surgeon: Marty Heck, MD;  Location: [place] [facility];  Service: Cardiovascular;  Laterality: Left;  arm fistula   TOTAL HIP ARTHROPLASTY Left 06/10/2019   Procedure: TOTAL HIP ARTHROPLASTY ANTERIOR APPROACH;  Surgeon: Leandrew Koyanagi, MD;  Location: [place];  Service: Orthopedics;  Laterality: Left;   HPI:  Pt is a 76 y.o. F who presents 03/15/2021 with AMS and cough shortly after she was put on a medication for shingles. Dx acute metabolic encephalopathy, pna.  Significant PMH: anemia, ESRD on HD, gout, HTN. CT cervical spoine 06/2019 Disc degeneration and  spurring C4-5 and C5-6; spurring on left C6-7.    Assessment / Plan / Recommendation  Clinical Impression  Pt presents with functional oropharyngeal swallow marked only by occasional hesitation/oral holding of POs related to mental status changes.  Otherwise, the mechanics of her swallow look fine with adequate mastication, the appearance of a brisk swallow, and no s/s of aspiration.  Recommend crushing meds with purees until MS returns to baseline; continue regular solids and thin liquids;  assist with feeding due to limb ataxia.  No SLP f/u is needed. SLP Visit Diagnosis: Dysphagia, unspecified (R13.10)    Aspiration Risk  No limitations    Diet Recommendation   Regular solids, thin liquids  Medication Administration: Crushed with puree    Other  Recommendations Oral Care Recommendations: Oral care BID    Recommendations for follow up therapy are one component of a multi-disciplinary discharge planning process, led by the attending physician.  Recommendations may be updated based on patient status, additional functional criteria and insurance authorization.  Follow up Recommendations No SLP follow up      Assistance Recommended at Discharge    Functional Status Assessment    Frequency and Duration            Prognosis        Swallow Study   General Date of Onset: 03/15/21 HPI: Pt is a 76 y.o. F who presents 03/15/2021 with AMS and cough shortly after she was put on a medication for shingles. Dx acute metabolic encephalopathy, pna.  Significant PMH: anemia, ESRD on HD, gout, HTN. CT cervical spoine 06/2019 Disc degeneration and spurring C4-5 and C5-6; spurring on left C6-7. Type of Study: Bedside Swallow Evaluation Previous Swallow Assessment: no Diet Prior to this Study: Regular;Thin liquids Temperature Spikes Noted: No Respiratory Status: Nasal cannula History of Recent Intubation: No Behavior/Cognition: Alert;Cooperative;Confused Oral Cavity Assessment: Within Functional Limits Oral Care Completed by SLP: No Oral Cavity - Dentition: Adequate natural dentition Vision: Functional for self-feeding Self-Feeding Abilities: Needs assist Patient Positioning: Upright in bed Baseline Vocal Quality: Normal Volitional Cough: Strong Volitional Swallow: Able to elicit    Oral/Motor/Sensory Function Overall Oral Motor/Sensory Function: Within functional limits   Ice Chips Ice chips: Within functional limits   Thin Liquid Thin Liquid: Within functional limits     Nectar Thick Nectar Thick Liquid: Not tested   Honey Thick Honey Thick Liquid: Not tested   Puree Puree: Within functional limits   Solid     Solid: Within functional limits      Juan Quam Laurice 03/16/2021,1:36 PM Estill Bamberg L. Tivis Ringer, [place] Office number [phone] Pager [phone]

## 2021-03-16 NOTE — Assessment & Plan Note (Addendum)
Hemoglobin stable. Recent Labs  Lab 03/15/21 0500 03/15/21 0514 03/16/21 0642  HGB 13.0 15.0 11.9*  HCT 40.0 44.0 37.2

## 2021-03-17 ENCOUNTER — Inpatient Hospital Stay (HOSPITAL_COMMUNITY): Payer: Medicare HMO

## 2021-03-17 LAB — BASIC METABOLIC PANEL
Anion gap: 18 — ABNORMAL HIGH (ref 5–15)
BUN: 49 mg/dL — ABNORMAL HIGH (ref 8–23)
CO2: 26 mmol/L (ref 22–32)
Calcium: 8.7 mg/dL — ABNORMAL LOW (ref 8.9–10.3)
Chloride: 95 mmol/L — ABNORMAL LOW (ref 98–111)
Creatinine, Ser: 9.63 mg/dL — ABNORMAL HIGH (ref 0.44–1.00)
GFR, Estimated: 4 mL/min — ABNORMAL LOW (ref >60)
Glucose, Bld: 98 mg/dL (ref 70–99)
Potassium: 4.6 mmol/L (ref 3.5–5.1)
Sodium: 139 mmol/L (ref 135–145)

## 2021-03-17 LAB — GLUCOSE, CAPILLARY
Glucose-Capillary: 109 mg/dL — ABNORMAL HIGH (ref 70–99)
Glucose-Capillary: 136 mg/dL — ABNORMAL HIGH (ref 70–99)
Glucose-Capillary: 122 mg/dL — ABNORMAL HIGH (ref 70–99)
Glucose-Capillary: 125 mg/dL — ABNORMAL HIGH (ref 70–99)

## 2021-03-17 IMAGING — CT CT CHEST W/O CM
2 of 5 series · 13 of 36 positions shown, 16 images · non-contrast
Comparison: [DATE], [DATE]

CLINICAL DATA: Pulmonary nodules, abnormal chest x-ray



[Series 4: thorax 2.0 · axial · 0.85mm/px · z∈[+1276,+1524]mm · 10 of 152 slices shown, 13 images]
[im 14/152  mediastinal]
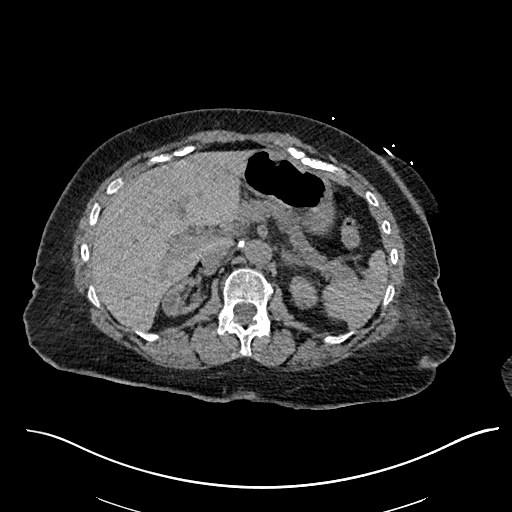
[im 14/152  lung]
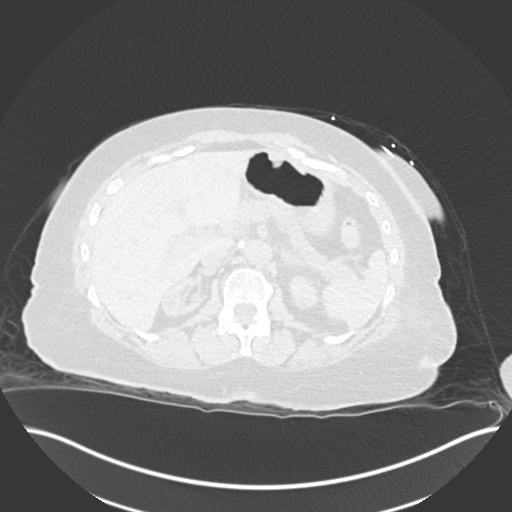
[im 28/152  lung]
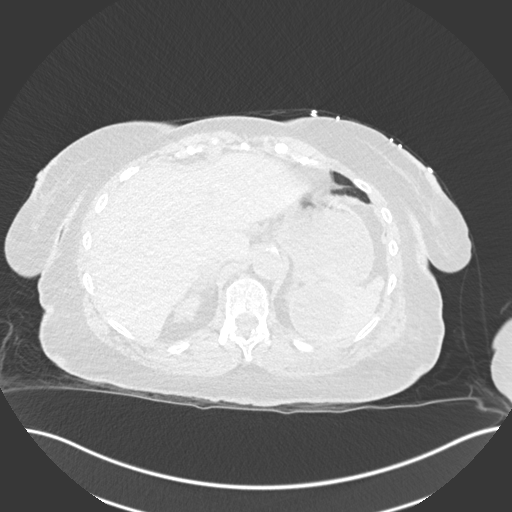
[im 42/152  lung]
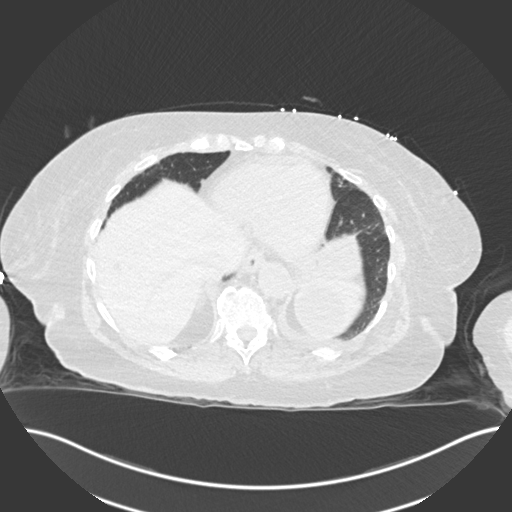
[im 55/152  lung]
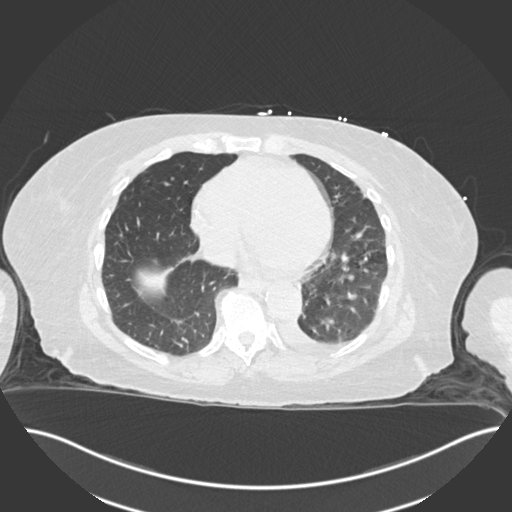
[im 69/152  mediastinal]
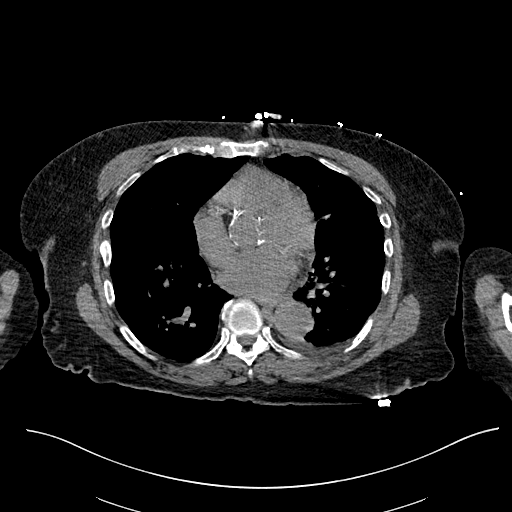
[im 69/152  lung]
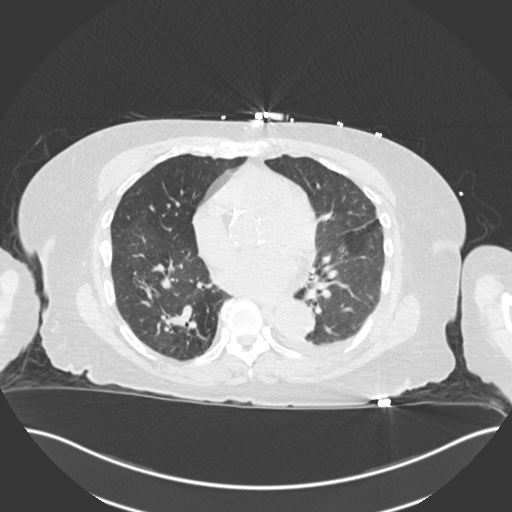
[im 83/152  lung]
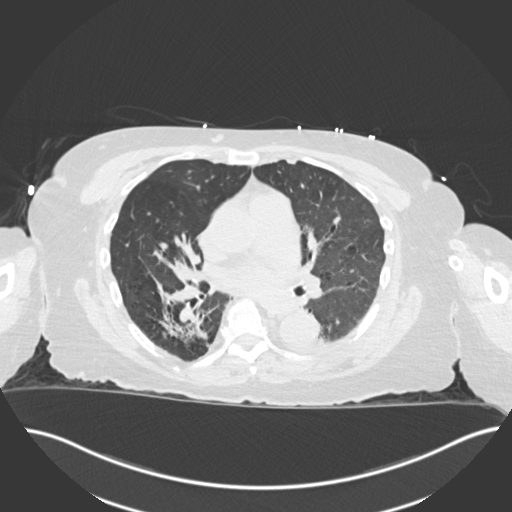
[im 97/152  lung]
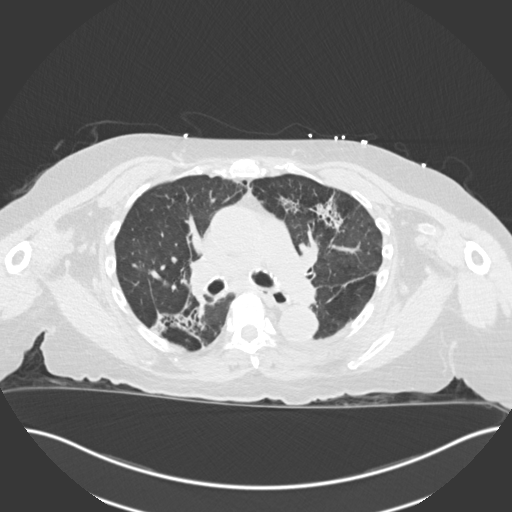
[im 110/152  lung]
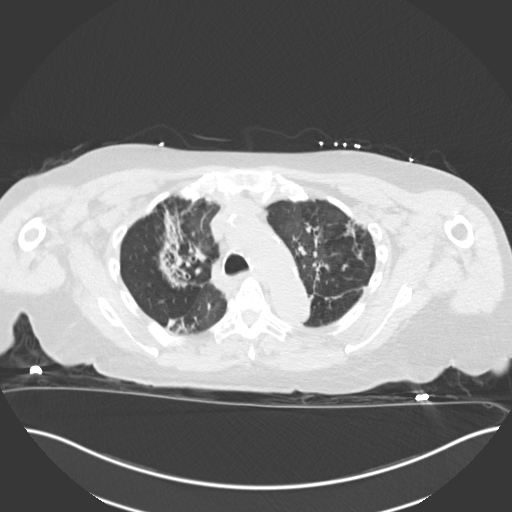
[im 124/152  mediastinal]
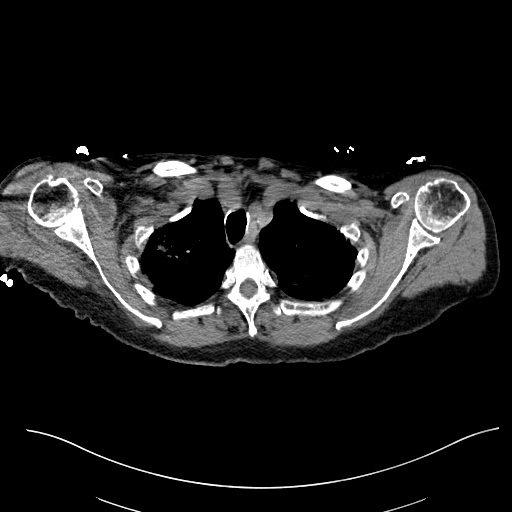
[im 124/152  lung]
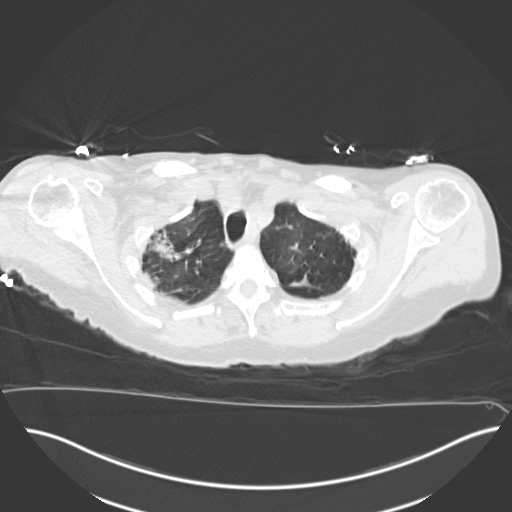
[im 138/152  lung]
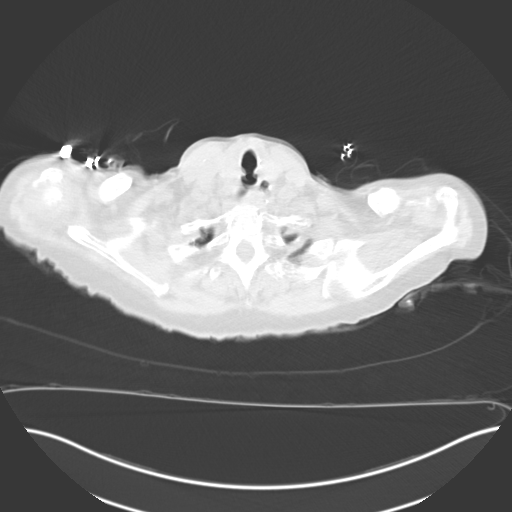

[Series 6: coronal · coronal · 0.63mm/px · 3 of 83 slices shown]
[im 17/83  lung]
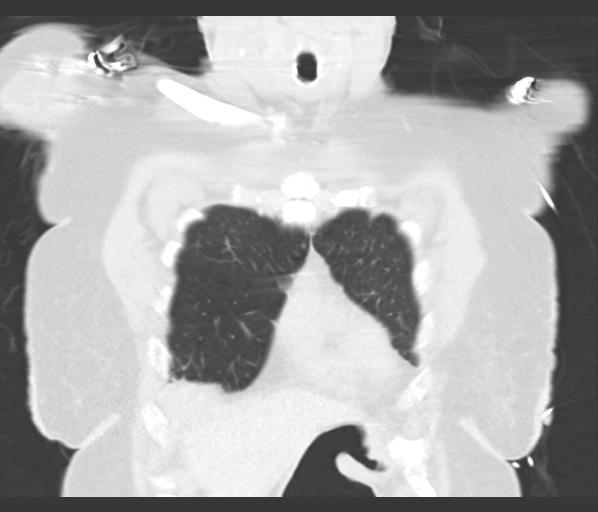
[im 33/83  lung]
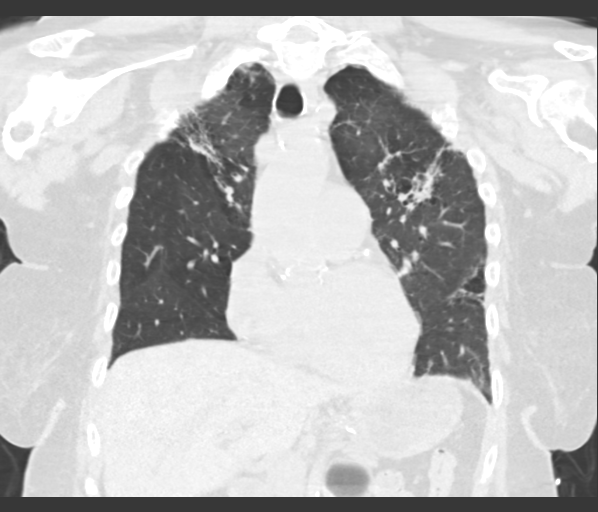
[im 50/83  lung]
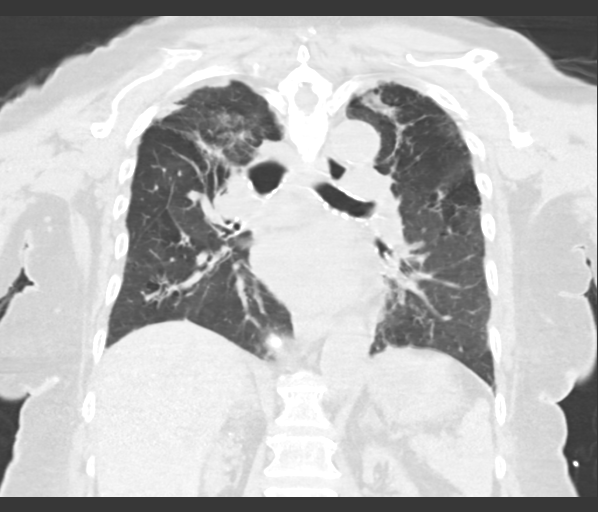

[13 of 36 positions shown; findings below may reference images not displayed]

FINDINGS: Cardiovascular: Unenhanced imaging of the heart and great vessels
demonstrates no significant pericardial effusion. There is normal
caliber of the thoracic aorta. Mild diffuse atherosclerosis of the
aorta and coronary vasculature. Evaluation of the vascular lumen is
limited without IV contrast.

Mediastinum/Nodes: No enlarged mediastinal or axillary lymph nodes.
Thyroid gland, trachea, and esophagus demonstrate no significant
findings.

Lungs/Pleura: Chronic scarring is seen within the bilateral upper
lobes and superior segment right lower lobe. The left upper lobe
scarring likely accounts for the nodular density seen on recent
chest x-ray. Trace left pleural effusion. No acute airspace disease.
No pneumothorax. The central airways are patent.

Upper Abdomen: Stable splenic cyst. Chronic renal atrophy consistent
with end-stage renal disease.

Musculoskeletal: Minimally displaced acute left posterior eighth rib
fracture. Numerous prior healed right rib fractures. Reconstructed
images demonstrate no additional findings.
IMPRESSION: 1. Minimally displaced acute posterior left eighth rib fracture.
2. Trace left pleural effusion.
3. Chronic scarring within the bilateral upper lobes and superior
segment right lower lobe. The left upper lobe scarring corresponds
to the chest x-ray finding.
4.  Aortic Atherosclerosis ([4J]-[4J]).

## 2021-03-17 MED ORDER — GERHARDT'S BUTT CREAM
TOPICAL_CREAM | Freq: Three times a day (TID) | CUTANEOUS | Status: DC | PRN
  Filled 2021-03-17: qty 1

## 2021-03-17 MED ORDER — ESCITALOPRAM OXALATE 10 MG PO TABS
10.0000 mg | ORAL_TABLET | Freq: Every day | ORAL | Status: DC
  Administered 2021-03-17 – 2021-03-23 (×6): 10 mg via ORAL
  Filled 2021-03-17 (×6): qty 1

## 2021-03-17 MED ORDER — AMLODIPINE BESYLATE 10 MG PO TABS
10.0000 mg | ORAL_TABLET | Freq: Every day | ORAL | Status: DC
  Administered 2021-03-18 – 2021-03-23 (×4): 10 mg via ORAL
  Filled 2021-03-17 (×5): qty 1

## 2021-03-17 NOTE — Evaluation (Signed)
Occupational Therapy Evaluation Patient Details Name: Bianca Myers MRN: [id number] DOB: 1945/04/26 Today's Date: 03/17/2021   History of Present Illness Pt is a 76 y.o. F who presents 03/15/2021 with AMS and cough shortly after she was put on a medication for shingles. Significant PMH: anemia, ESRD on HD, gout, HTN.   Clinical Impression   PT admitted with AMS/ shingles. Pt currently with functional limitiations due to the deficits listed below (see OT problem list). Pt currently min (A) for stand pivot transfer to [facility]. Pt very confused repeating questions. Pt with change in tone with position change showing confusion increased with chair position. Son at bedside to help with feeding at this time.  Pt will benefit from skilled OT to increase their independence and safety with adls and balance to allow discharge SNF.      Recommendations for follow up therapy are one component of a multi-disciplinary discharge planning process, led by the attending physician.  Recommendations may be updated based on patient status, additional functional criteria and insurance authorization.   Follow Up Recommendations  Skilled nursing-short term rehab (<3 hours/day)    Assistance Recommended at Discharge Intermittent Supervision/Assistance  Patient can return home with the following A little help with walking and/or transfers;A little help with bathing/dressing/bathroom;Assistance with cooking/housework;Assistance with feeding;Direct supervision/assist for medications management;Direct supervision/assist for financial management;Assist for transportation    Functional Status Assessment  Patient has had a recent decline in their functional status and demonstrates the ability to make significant improvements in function in a reasonable and predictable amount of time.  Equipment Recommendations  BSC/3in1    Recommendations for Other Services       Precautions / Restrictions Precautions Precautions:  Fall      Mobility Bed Mobility Overal bed mobility: Needs Assistance Bed Mobility: Supine to Sit Rolling: Modified independent (Device/Increase time)   Supine to sit: Min guard     General bed mobility comments: cues for safety due to impulsive but able to complete bed mobility without (A)    Transfers Overall transfer level: Needs assistance Equipment used: 1 person hand held assist Transfers: Sit to/from Stand Sit to Stand: Min assist           General transfer comment: pt states "let me do it" "i can do it" "i do it myself i can do it"      Balance Overall balance assessment: Needs assistance Sitting-balance support: Bilateral upper extremity supported;Feet supported Sitting balance-Leahy Scale: Fair     Standing balance support: Bilateral upper extremity supported;During functional activity Standing balance-Leahy Scale: Fair Standing balance comment: reliant on RW for static standin for peri care                           ADL either performed or assessed with clinical judgement   ADL Overall ADL's : Needs assistance/impaired Eating/Feeding: Set up Eating/Feeding Details (indicate cue type and reason): declines lunch tray states "i dont eat chicken" Grooming: Wash/dry hands;Min guard           Upper Body Dressing : Minimal assistance;Sitting Upper Body Dressing Details (indicate cue type and reason): don new gown     Toilet Transfer: Minimal assistance;BSC/3in1 Toilet Transfer Details (indicate cue type and reason): 1 step command with increased time to process Toileting- Clothing Manipulation and Hygiene: Total assistance Toileting - Clothing Manipulation Details (indicate cue type and reason): void of bowel - diarrhea with green hue noted       General ADL  Comments: pt progressed from bed to chair to bsc to chair     Vision   Additional Comments: difficult to formally assess but able to visually track therapist. pt able to locate  items on tray when cued     Perception     Praxis      Pertinent Vitals/Pain Pain Assessment: No/denies pain     Hand Dominance Right   Extremity/Trunk Assessment Upper Extremity Assessment Upper Extremity Assessment: Overall WFL for tasks assessed   Lower Extremity Assessment Lower Extremity Assessment: Defer to PT evaluation   Cervical / Trunk Assessment Cervical / Trunk Assessment: Kyphotic   Communication Communication Communication: No difficulties   Cognition Arousal/Alertness: Awake/alert Behavior During Therapy: Restless Overall Cognitive Status: Impaired/Different from baseline Area of Impairment: Orientation;Attention;Memory;Following commands;Safety/judgement;Awareness;Problem solving                 Orientation Level: Disoriented to;Time;Place;Situation Current Attention Level: Sustained Memory: Decreased recall of precautions Following Commands: Follows one step commands inconsistently Safety/Judgement: Decreased awareness of safety;Decreased awareness of deficits Awareness: Intellectual Problem Solving: Slow processing General Comments: pt asking the same questions multiple times. pt states "i can do this myself" pt lacks awareness to fall risk     General Comments  VSS son present then stepping out of room for peri / BSC adl then returning once pt back in recliner/ Son to (A) with getting patient to eat    Exercises     Shoulder Instructions      Home Living Family/patient expects to be discharged to:: Private residence Living Arrangements: Children Available Help at Discharge: Family Type of Home: House Home Access: Stairs to enter Technical brewer of Steps: 2 Entrance Stairs-Rails: None Home Layout: Able to live on main level with bedroom/bathroom     Bathroom Shower/Tub: Teacher, early years/pre: Standard     Home Equipment: Conservation officer, nature (2 wheels);BSC/3in1;Cane - single point;Shower seat;Wheelchair - manual    Additional Comments: son present.      Prior Functioning/Environment Prior Level of Function : Needs assist             Mobility Comments: rollator/ wheelchair for family to push          OT Problem List: Decreased activity tolerance;Impaired balance (sitting and/or standing);Decreased cognition;Decreased safety awareness;Decreased knowledge of use of DME or AE;Decreased knowledge of precautions;Obesity      OT Treatment/Interventions: Self-care/ADL training;Energy conservation;DME and/or AE instruction;Therapeutic activities;Cognitive remediation/compensation;Patient/family education;Balance training    OT Goals(Current goals can be found in the care plan section) Acute Rehab OT Goals Patient Stated Goal: to go home OT Goal Formulation: With patient/family Time For Goal Achievement: 03/31/21 Potential to Achieve Goals: Good  OT Frequency: Min 2X/week    Co-evaluation              AM-PAC OT "6 Clicks" Daily Activity     Outcome Measure Help from another person eating meals?: A Little Help from another person taking care of personal grooming?: A Little Help from another person toileting, which includes using toliet, bedpan, or urinal?: A Little Help from another person bathing (including washing, rinsing, drying)?: A Little Help from another person to put on and taking off regular upper body clothing?: A Little Help from another person to put on and taking off regular lower body clothing?: A Lot 6 Click Score: 17   End of Session Equipment Utilized During Treatment: Rolling walker (2 wheels) Nurse Communication: Mobility status;Precautions  Activity Tolerance: Patient tolerated treatment well Patient left: in  chair;with call bell/phone within reach;with chair alarm set;with family/visitor present  OT Visit Diagnosis: Unsteadiness on feet (R26.81)                Time: 1313-1330 OT Time Calculation (min): 17 min Charges:  OT General Charges $OT Visit: 1  Visit OT Evaluation $OT Eval Moderate Complexity: 1 Mod   Brynn, OTR/L  Acute Rehabilitation Services Pager: [phone] Office: [phone] .   Jeri Modena 03/17/2021, 3:02 PM

## 2021-03-17 NOTE — Progress Notes (Addendum)
Subjective: Seen this a.m., not agitated, awake ,talkative, almost at baseline, confused about why she came here and not remember it or yesterday.  Aware HD today is MWF tomorrow  Objective Vital signs in last 24 hours: Vitals:   03/16/21 2052 03/17/21 0511 03/17/21 0513 03/17/21 0823  BP: (!) 159/70 (!) 176/87  (!) 161/81  Pulse: 77 67 71 69  Resp: 20 (!) 21  18  Temp: 98.4 F (36.9 C) 98.2 F (36.8 C)  98.5 F (36.9 C)  TempSrc: Oral Oral  Oral  SpO2: 99% (!) 86% 95% 100%  Weight:   75.2 kg   Height:       Weight change: 2.7 kg  Physical Exam: General: Alert, elderly female ,NAD, pleasantly confused Heart: RRR no MRG Lungs: CTA, nonlabored breathing Abdomen: NABS, S, NT ND no ascites Extremities: No lower extremity edema Dialysis Access: Positive bruit L UA AV fistula    Home meds include - norvasc, lipitor, lexapro, auryxia, hydralazine, labetalol, rena-vit, nepro, vits/supps/ prns    OP HD: MWF [facility]  3h  75kg  2/2 bath  LUA AVG  Hep 2000   - calcitriol 1.5 ug tiw   - mircera 200 q2, last 12/28   Problem/Plan: AMS -multifactorial =in setting of 1 missed HD and ESRD pt recently prescribed Valtrex (son unaware if renally dosed) and gabapentin(also not sure of dosing) = COMMON  Cause of AMS when given to ESRD pt's at the non-ESRD dose.  Has improved some since dialysis,  Valtrex takes at least 2-3 days to wash out  sometimes longer.  Neurontin effects are not as long. Also meds plus infectious process from pneumonia in ESRD patient was seen by psych Possible PNA - getting IV abx for suspected CAP, per pmd.  ESRD - on HD MWF.  HD Friday 01/13 .  Labs predialysis BUN 82 CR 14. K 6.4 missed OP HD Wed.  Today a.m. labs K4.6 BUN 49 CR 9.63, next dialysis Monday on schedule with decrease time 2nd staff shortage and patient load BP/ volume - on multiple BP lowering meds. Reintroduce here as needed. Not vol overloaded on exam. CXR w/ LLL changes, scarring upper lobes.  Did not tolerate  1/13 HD large UF.  BP 176/87, 161/81 starting to rise, starting back BP meds . 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 and vdra w/ HD DM type II= hemoglobin A1c 5.2, plan per admit  Ernest Haber, PA-C [place] [phone] 03/17/2021,11:11 AM  LOS: 2 days   Labs: Basic Metabolic Panel: Recent Labs  Lab 03/15/21 0500 03/15/21 0514 03/16/21 0642 03/17/21 0730  NA 134* 131* 137 139  K 6.4* 6.2* 4.9 4.6  CL 89* 98 93* 95*  CO2 20*  --  26 26  GLUCOSE 185* 184* 101* 98  BUN 82* 74* 33* 49*  CREATININE 12.97* 14.00* 7.35* 9.63*  CALCIUM 9.4  --  9.0 8.7*   Liver Function Tests: Recent Labs  Lab 03/15/21 0500  AST 19  ALT 13  ALKPHOS 72  BILITOT 3.7*  PROT 7.8  ALBUMIN 3.9   No results for input(s): LIPASE, AMYLASE in the last 168 hours. Recent Labs  Lab 03/15/21 0500  AMMONIA 13   CBC: Recent Labs  Lab 03/15/21 0500 03/15/21 0514 03/16/21 0642  WBC 9.6  --  8.0  NEUTROABS 7.9*  --   --   HGB 13.0 15.0 11.9*  HCT 40.0 44.0 37.2  MCV 91.7  --  90.1  PLT 293  --  270   Cardiac Enzymes: No results for input(s): CKTOTAL, CKMB, CKMBINDEX, TROPONINI in the last 168 hours. CBG: Recent Labs  Lab 03/16/21 0652 03/16/21 1124 03/16/21 1714 03/16/21 2140 03/17/21 0753  GLUCAP 100* 113* 114* 155* 109*    Studies/Results: No results found. Medications:  sodium chloride     sodium chloride     cefTRIAXone (ROCEPHIN)  IV 2 g (03/17/21 0835)   lidocaine-prilocaine      [START ON 03/18/2021] amLODipine  10 mg Oral Daily   atorvastatin  10 mg Oral QPM   azithromycin  500 mg Oral Daily   [START ON 03/18/2021] calcitRIOL  1.5 mcg Oral Q M,W,F-HD   Chlorhexidine Gluconate Cloth  6 each Topical Q0600   docusate sodium  100 mg Oral BID   escitalopram  10 mg Oral Daily   ferric citrate  420 mg Oral TID WC   heparin  5,000 Units Subcutaneous Q8H   hydrALAZINE  25 mg Oral Q8H   insulin aspart  0-6 Units Subcutaneous TID WC    labetalol  200 mg Oral BID   multivitamin  1 tablet Oral QHS   sodium chloride flush  3 mL Intravenous Q12H

## 2021-03-17 NOTE — Progress Notes (Signed)
PROGRESS NOTE    Bianca Myers  TML:[id number] DOB: [birth year] DOA: 03/15/2021 PCP: [name], MD   Chief Complaint  Patient presents with   Altered Mental Status   Weakness  Brief Narrative/Hospital Course: Bianca Myers, 76 y.o. female with PMH of ESRD on HD MWF,DM,HTN, depression presenting with AMS. She was treated outpatient for pneumonia 10 days ago, continues to have cough and SOB, was seen again for left flank pain a couple days and started on gabapentin and valacyclovir for suspected zoster without rash, and has since developed worsening speech and cognitive disturbance. She was last dialyzed on Monday. ED workup notable for potassium 6.4, BUN 82, and suspected pneumonia. No rash in ED  Nephrology was consulted by ED and she was given vancomycin and Zosyn. Patient was admitted underwent hemodialysis Valtrex and gabapentin was discontinued.  She was placed on empiric antibiotics for pneumonia. Mental status significantly improved seen by psychiatrist no further recommendation, seen by PT OT and has advised skilled nursing facility due to deconditioning and debility.  Subjective: Seen this morning.  Patient is alert awake communicative interactive not in distress Overnight no fever, blood pressure 150s to 170s Bmp pending  Assessment & Plan: * Acute metabolic encephalopathy- (present on admission) Multifactorial in the setting of missed dialysis and  2/2 gabapentin and valacyclovir.Suspecting acute toxic metabolic encephalopathy.  Mentation improved to baseline continue HD per schedule and not to miss it.  Resume Lexapro on discharge.  Seen by psychiatry, seen by PT OT.    Pneumonia- (present on admission) Imaging- LLL bronchopneumonia,nodular density in the LUL-obtain CT chest without to evaluate further.Continue ceftriaxone/azithromycin for community-acquired pneumonia.  Will need follow-up x-ray   End-stage renal disease on hemodialysis [facility]) On HD MWF.missed  Wednesday session, which may have increased her risk of medication reaction, nephro consulted-had HD 1/13.  Continue binders home meds  Dyslipidemia- (present on admission) On Lipitor  Diabetes mellitus ([place]) 1c stable 5.2, blood sugar controlled, continue on sliding scale insulin.  Not on meds at home. Recent Labs  Lab 03/15/21 0500 03/15/21 1156 03/15/21 1733 03/15/21 2112 03/16/21 0652  GLUCAP  --  153* 106* 132* 100*  HGBA1C 5.2  --   --   --   --     Essential hypertension- (present on admission) Poorly controlled, On Labetalol, hydralazine, Norvas-dose being adjusted by nephrology, cont prn IV hydralazine.   Anemia of chronic kidney failure, stage 5 (HCC)- (present on admission) Hemoglobin appears stable and normal. Recent Labs  Lab 03/15/21 0500 03/15/21 0514 03/16/21 0642  HGB 13.0 15.0 11.9*  HCT 40.0 44.0 37.2    DVT prophylaxis: heparin injection 5,000 Units Start: 03/15/21 1400 Code Status:   Code Status: Full Code Family Communication: plan of care discussed with patient at bedside. Status is: Inpatient Remains inpatient appropriate because: Due to ongoing management pneumonia Disposition: Currently is medically stable for discharge. Anticipated Disposition: SNF once bed available  Total time spent in the care of this patient  35 MINUTES Objective: Vitals last 24 hrs: Vitals:   03/16/21 2052 03/17/21 0511 03/17/21 0513 03/17/21 0823  BP: (!) 159/70 (!) 176/87  (!) 161/81  Pulse: 77 67 71 69  Resp: 20 (!) 21  18  Temp: 98.4 F (36.9 C) 98.2 F (36.8 C)  98.5 F (36.9 C)  TempSrc: Oral Oral  Oral  SpO2: 99% (!) 86% 95% 100%  Weight:   75.2 kg   Height:       Weight change: 2.7 kg  Intake/Output Summary (  Last 24 hours) at 03/17/2021 1048 Last data filed at 03/17/2021 0836 Gross per 24 hour  Intake 823 ml  Output --  Net 823 ml   Net IO Since Admission: -1,350.49 mL [03/17/21 1048]   Physical Examination: General exam: AA oriented to state  baseline HEENT:Oral mucosa moist, Ear/Nose WNL grossly, dentition normal. Respiratory system: bilaterally CLEAR,no use of accessory muscle Cardiovascular system: S1 & S2 +, No JVD,. Gastrointestinal system: Abdomen soft,NT,ND, BS+ Nervous System:Alert, awake, moving extremities and grossly nonfocal Extremities: no edema, distal peripheral pulses palpable.  Skin: Healed rash on lower extremity, no icterus. MSK: Normal muscle bulk,tone, power   Medications reviewed: Scheduled Meds:  [START ON 03/18/2021] amLODipine  10 mg Oral Daily   atorvastatin  10 mg Oral QPM   azithromycin  500 mg Oral Daily   [START ON 03/18/2021] calcitRIOL  1.5 mcg Oral Q M,W,F-HD   Chlorhexidine Gluconate Cloth  6 each Topical Q0600   docusate sodium  100 mg Oral BID   escitalopram  10 mg Oral Daily   ferric citrate  420 mg Oral TID WC   heparin  5,000 Units Subcutaneous Q8H   hydrALAZINE  25 mg Oral Q8H   insulin aspart  0-6 Units Subcutaneous TID WC   labetalol  200 mg Oral BID   multivitamin  1 tablet Oral QHS   sodium chloride flush  3 mL Intravenous Q12H   Continuous Infusions:  sodium chloride     sodium chloride     cefTRIAXone (ROCEPHIN)  IV 2 g (03/17/21 0835)   lidocaine-prilocaine     Diet Order             Diet renal with fluid restriction Fluid restriction: 1200 mL Fluid; Room service appropriate? Yes with Assist; Fluid consistency: Thin  Diet effective ____                 Weight change: 2.7 kg  Wt Readings from Last 3 Encounters:  03/17/21 75.2 kg  12/26/19 74.8 kg  07/28/19 77.1 kg     Consultants:see note  Procedures:see note Antimicrobials: Anti-infectives (From admission, onward)    Start     Dose/Rate Route Frequency Ordered Stop   03/16/21 1000  azithromycin (ZITHROMAX) tablet 500 mg        500 mg Oral Daily 03/16/21 0753 03/20/21 0959   03/15/21 1000  cefTRIAXone (ROCEPHIN) 2 g in sodium chloride 0.9 % 100 mL IVPB        2 g 200 mL/hr over 30 Minutes Intravenous  Every 24 hours 03/15/21 0946 03/20/21 0959   03/15/21 1000  azithromycin (ZITHROMAX) 500 mg in sodium chloride 0.9 % 250 mL IVPB  Status:  Discontinued        500 mg 250 mL/hr over 60 Minutes Intravenous Every 24 hours 03/15/21 0946 03/16/21 0753   03/15/21 0630  vancomycin (VANCOREADY) IVPB 1500 mg/300 mL        1,500 mg 150 mL/hr over 120 Minutes Intravenous  Once 03/15/21 0621 03/15/21 1030   03/15/21 0630  piperacillin-tazobactam (ZOSYN) IVPB 3.375 g        3.375 g 100 mL/hr over 30 Minutes Intravenous  Once 03/15/21 0621 03/15/21 0817      Culture/Microbiology    Component Value Date/Time   SDES BLOOD BLOOD RIGHT FOREARM 03/15/2021 0624   SPECREQUEST  03/15/2021 0624    BOTTLES DRAWN AEROBIC AND ANAEROBIC Blood Culture adequate volume   CULT  03/15/2021 0624    NO GROWTH 1 DAY Performed at  [facility], [place] [address], [place], [place] [address]    REPTSTATUS PENDING 03/15/2021 8127    Other culture-see note  Unresulted Labs (From admission, onward)     Start     Ordered   03/17/21 5170  Basic metabolic panel  Daily,   R     Question:  Specimen collection method  Answer:  Lab=Lab collect   03/16/21 1106   03/15/21 0624  Blood culture (routine x 2)  BLOOD CULTURE X 2,   STAT      03/15/21 0623   03/15/21 0453  Urinalysis, Routine w reflex microscopic  Once,   STAT        03/15/21 0452   03/15/21 0453  Rapid urine drug screen (hospital performed)  ONCE - STAT,   STAT        03/15/21 0452          Data Reviewed: I have personally reviewed following labs and imaging studies CBC: Recent Labs  Lab 03/15/21 0500 03/15/21 0514 03/16/21 0642  WBC 9.6  --  8.0  NEUTROABS 7.9*  --   --   HGB 13.0 15.0 11.9*  HCT 40.0 44.0 37.2  MCV 91.7  --  90.1  PLT 293  --  017   Basic Metabolic Panel: Recent Labs  Lab 03/15/21 0500 03/15/21 0514 03/16/21 0642 03/17/21 0730  NA 134* 131* 137 139  K 6.4* 6.2* 4.9 4.6  CL 89* 98 93* 95*  CO2 20*  --  26 26   GLUCOSE 185* 184* 101* 98  BUN 82* 74* 33* 49*  CREATININE 12.97* 14.00* 7.35* 9.63*  CALCIUM 9.4  --  9.0 8.7*   GFR: Estimated Creatinine Clearance: 5.3 mL/min (A) (by C-G formula based on SCr of 9.63 mg/dL (H)). Liver Function Tests: Recent Labs  Lab 03/15/21 0500  AST 19  ALT 13  ALKPHOS 72  BILITOT 3.7*  PROT 7.8  ALBUMIN 3.9   No results for input(s): LIPASE, AMYLASE in the last 168 hours. Recent Labs  Lab 03/15/21 0500  AMMONIA 13   Coagulation Profile: No results for input(s): INR, PROTIME in the last 168 hours. Cardiac Enzymes: No results for input(s): CKTOTAL, CKMB, CKMBINDEX, TROPONINI in the last 168 hours. BNP (last 3 results) No results for input(s): PROBNP in the last 8760 hours. HbA1C: Recent Labs    03/15/21 0500  HGBA1C 5.2   CBG: Recent Labs  Lab 03/16/21 0652 03/16/21 1124 03/16/21 1714 03/16/21 2140 03/17/21 0753  GLUCAP 100* 113* 114* 155* 109*   Lipid Profile: No results for input(s): CHOL, HDL, LDLCALC, TRIG, CHOLHDL, LDLDIRECT in the last 72 hours. Thyroid Function Tests: No results for input(s): TSH, T4TOTAL, FREET4, T3FREE, THYROIDAB in the last 72 hours. Anemia Panel: No results for input(s): VITAMINB12, FOLATE, FERRITIN, TIBC, IRON, RETICCTPCT in the last 72 hours. Sepsis Labs: Recent Labs  Lab 03/15/21 0500  LATICACIDVEN 1.4    Recent Results (from the past 240 hour(s))  Resp Panel by RT-PCR (Flu A&B, Covid) Nasopharyngeal Swab     Status: None   Collection Time: 03/15/21  4:52 AM   Specimen: Nasopharyngeal Swab; Nasopharyngeal(NP) swabs in vial transport medium  Result Value Ref Range Status   SARS Coronavirus 2 by RT PCR NEGATIVE NEGATIVE Final    Comment: (NOTE) SARS-CoV-2 target nucleic acids are NOT DETECTED.  The SARS-CoV-2 RNA is generally detectable in upper respiratory specimens during the acute phase of infection. The lowest concentration of SARS-CoV-2 viral copies this assay can  detect is 138 copies/mL.  A negative result does not preclude SARS-Cov-2 infection and should not be used as the sole basis for treatment or other patient management decisions. A negative result may occur with  improper specimen collection/handling, submission of specimen other than nasopharyngeal swab, presence of viral mutation(s) within the areas targeted by this assay, and inadequate number of viral copies(<138 copies/mL). A negative result must be combined with clinical observations, patient history, and epidemiological information. The expected result is Negative.  Fact Sheet for Patients:  EntrepreneurPulse.com.au  Fact Sheet for Healthcare Providers:  IncredibleEmployment.be  This test is no t yet approved or cleared by the [place] FDA and  has been authorized for detection and/or diagnosis of SARS-CoV-2 by FDA under an Emergency Use Authorization (EUA). This EUA will remain  in effect (meaning this test can be used) for the duration of the COVID-19 declaration under Section 564(b)(1) of the Act, 21 U.S.C.section 360bbb-3(b)(1), unless the authorization is terminated  or revoked sooner.       Influenza A by PCR NEGATIVE NEGATIVE Final   Influenza B by PCR NEGATIVE NEGATIVE Final    Comment: (NOTE) The Xpert Xpress SARS-CoV-2/FLU/RSV plus assay is intended as an aid in the diagnosis of influenza from Nasopharyngeal swab specimens and should not be used as a sole basis for treatment. Nasal washings and aspirates are unacceptable for Xpert Xpress SARS-CoV-2/FLU/RSV testing.  Fact Sheet for Patients: EntrepreneurPulse.com.au  Fact Sheet for Healthcare Providers: IncredibleEmployment.be  This test is not yet approved or cleared by the [place] FDA and has been authorized for detection and/or diagnosis of SARS-CoV-2 by FDA under an Emergency Use Authorization (EUA). This EUA will remain in effect (meaning this test  can be used) for the duration of the COVID-19 declaration under Section 564(b)(1) of the Act, 21 U.S.C. section 360bbb-3(b)(1), unless the authorization is terminated or revoked.  Performed at [facility], [place] [address] [place], [place] [address]   Blood culture (routine x 2)     Status: None (Preliminary result)   Collection Time: 03/15/21  6:24 AM   Specimen: BLOOD  Result Value Ref Range Status   Specimen Description BLOOD BLOOD RIGHT FOREARM  Final   Special Requests   Final    BOTTLES DRAWN AEROBIC AND ANAEROBIC Blood Culture adequate volume   Culture   Final    NO GROWTH 1 DAY Performed at [facility], [place] [address] [place], [place] [address]    Report Status PENDING  Incomplete  MRSA Next Gen by PCR, Nasal     Status: None   Collection Time: 03/15/21  8:25 AM   Specimen: Nasal Mucosa; Nasal Swab  Result Value Ref Range Status   MRSA by PCR Next Gen NOT DETECTED NOT DETECTED Final    Comment: (NOTE) The GeneXpert MRSA Assay (FDA approved for NASAL specimens only), is one component of a comprehensive MRSA colonization surveillance program. It is not intended to diagnose MRSA infection nor to guide or monitor treatment for MRSA infections. Test performance is not FDA approved in patients less than 75 years old. Performed at [facility], [place] [address] [place], [place] [address]      Radiology Studies: No results found.   LOS: 2 days   Antonieta Pert, MD [facility]  03/17/2021, 10:48 AM

## 2021-03-18 DIAGNOSIS — R197 Diarrhea, unspecified: Secondary | ICD-10-CM

## 2021-03-18 DIAGNOSIS — R159 Full incontinence of feces: Secondary | ICD-10-CM

## 2021-03-18 LAB — GASTROINTESTINAL PANEL BY PCR, STOOL (REPLACES STOOL CULTURE)

## 2021-03-18 LAB — CBC
HCT: 34.2 % — ABNORMAL LOW (ref 36.0–46.0)
Hemoglobin: 11.4 g/dL — ABNORMAL LOW (ref 12.0–15.0)
MCH: 29.7 pg (ref 26.0–34.0)
MCHC: 33.3 g/dL (ref 30.0–36.0)
MCV: 89.1 fL (ref 80.0–100.0)
Platelets: 269 10*3/uL (ref 150–400)
RBC: 3.84 MIL/uL — ABNORMAL LOW (ref 3.87–5.11)
RDW: 17.6 % — ABNORMAL HIGH (ref 11.5–15.5)
WBC: 6.8 10*3/uL (ref 4.0–10.5)
nRBC: 0 % (ref 0.0–0.2)

## 2021-03-18 LAB — RENAL FUNCTION PANEL
Albumin: 3.2 g/dL — ABNORMAL LOW (ref 3.5–5.0)
Anion gap: 18 — ABNORMAL HIGH (ref 5–15)
BUN: 58 mg/dL — ABNORMAL HIGH (ref 8–23)
CO2: 22 mmol/L (ref 22–32)
Calcium: 8.8 mg/dL — ABNORMAL LOW (ref 8.9–10.3)
Chloride: 94 mmol/L — ABNORMAL LOW (ref 98–111)
Creatinine, Ser: 11.58 mg/dL — ABNORMAL HIGH (ref 0.44–1.00)
GFR, Estimated: 3 mL/min — ABNORMAL LOW (ref >60)
Glucose, Bld: 95 mg/dL (ref 70–99)
Phosphorus: 7.1 mg/dL — ABNORMAL HIGH (ref 2.5–4.6)
Potassium: 4.4 mmol/L (ref 3.5–5.1)
Sodium: 134 mmol/L — ABNORMAL LOW (ref 135–145)

## 2021-03-18 LAB — BASIC METABOLIC PANEL
Anion gap: 16 — ABNORMAL HIGH (ref 5–15)
BUN: 56 mg/dL — ABNORMAL HIGH (ref 8–23)
CO2: 21 mmol/L — ABNORMAL LOW (ref 22–32)
Calcium: 9.3 mg/dL (ref 8.9–10.3)
Chloride: 96 mmol/L — ABNORMAL LOW (ref 98–111)
Creatinine, Ser: 11.26 mg/dL — ABNORMAL HIGH (ref 0.44–1.00)
GFR, Estimated: 3 mL/min — ABNORMAL LOW (ref >60)
Glucose, Bld: 92 mg/dL (ref 70–99)
Potassium: 4.3 mmol/L (ref 3.5–5.1)
Sodium: 133 mmol/L — ABNORMAL LOW (ref 135–145)

## 2021-03-18 LAB — GLUCOSE, CAPILLARY
Glucose-Capillary: 91 mg/dL (ref 70–99)
Glucose-Capillary: 95 mg/dL (ref 70–99)
Glucose-Capillary: 164 mg/dL — ABNORMAL HIGH (ref 70–99)
Glucose-Capillary: 110 mg/dL — ABNORMAL HIGH (ref 70–99)

## 2021-03-18 MED ORDER — LOPERAMIDE HCL 2 MG PO CAPS
2.0000 mg | ORAL_CAPSULE | ORAL | Status: DC | PRN
  Administered 2021-03-22: 2 mg via ORAL
  Filled 2021-03-18: qty 1

## 2021-03-18 MED ORDER — LIDOCAINE 5 % EX PTCH
1.0000 | MEDICATED_PATCH | CUTANEOUS | Status: DC
  Administered 2021-03-18 – 2021-03-22 (×5): 1 via TRANSDERMAL
  Filled 2021-03-18 (×5): qty 1

## 2021-03-18 NOTE — Assessment & Plan Note (Signed)
Patient having multiple incontinent episodes, GI panel pending.  Supportive care, PT care, will need to hold on binders, can try imodium

## 2021-03-18 NOTE — Progress Notes (Signed)
[facility] Progress Note   Subjective:  Seen in room. Remembers me from dialysis. Wants to talk to her son. Confused to surroundings, situation. "I think I'm dead" For dialysis today.   Objective Vitals:   03/18/21 0950 03/18/21 1000 03/18/21 1030 03/18/21 1100  BP: (!) 191/82 (!) 193/94 (!) 144/74 140/85  Pulse: 68 67 68 78  Resp: 16 17 (!) 25 (!) 23  Temp: 98 F (36.7 C)     TempSrc:      SpO2: 98%     Weight: 75.8 kg     Height:         Additional Objective Labs: Basic Metabolic Panel: Recent Labs  Lab 03/16/21 0642 03/17/21 0730 03/18/21 0621  NA 137 139 133*  K 4.9 4.6 4.3  CL 93* 95* 96*  CO2 26 26 21*  GLUCOSE 101* 98 92  BUN 33* 49* 56*  CREATININE 7.35* 9.63* 11.26*  CALCIUM 9.0 8.7* 9.3   CBC: Recent Labs  Lab 03/15/21 0500 03/15/21 0514 03/16/21 0642 03/18/21 1020  WBC 9.6  --  8.0 6.8  NEUTROABS 7.9*  --   --   --   HGB 13.0 15.0 11.9* 11.4*  HCT 40.0 44.0 37.2 34.2*  MCV 91.7  --  90.1 89.1  PLT 293  --  270 269   Blood Culture    Component Value Date/Time   SDES BLOOD RIGHT HAND 03/16/2021 0642   SPECREQUEST  03/16/2021 0642    BOTTLES DRAWN AEROBIC ONLY Blood Culture adequate volume   CULT  03/16/2021 0642    NO GROWTH 2 DAYS Performed at [facility], [place] [address], [place], [place] [address]    REPTSTATUS PENDING 03/16/2021 5361     Physical Exam General: Sitting up in bed, confused, nad  Heart: RRR No m,r,g Lungs: Clear bilaterally  Abdomen: soft non-tender  Extremities: No LE edema  Dialysis Access: LUE AVF +bruit   Medications:  sodium chloride     sodium chloride     cefTRIAXone (ROCEPHIN)  IV 2 g (03/17/21 0835)   lidocaine-prilocaine      amLODipine  10 mg Oral Daily   atorvastatin  10 mg Oral QPM   azithromycin  500 mg Oral Daily   calcitRIOL  1.5 mcg Oral Q M,W,F-HD   Chlorhexidine Gluconate Cloth  6 each Topical Q0600   docusate sodium  100 mg Oral BID   escitalopram  10 mg Oral  Daily   ferric citrate  420 mg Oral TID WC   heparin  5,000 Units Subcutaneous Q8H   hydrALAZINE  25 mg Oral Q8H   insulin aspart  0-6 Units Subcutaneous TID WC   labetalol  200 mg Oral BID   multivitamin  1 tablet Oral QHS   sodium chloride flush  3 mL Intravenous Q12H    Dialysis Orders:  MWF [facility]  3h  75kg  2/2 bath  LUA AVG  Hep 2000   - calcitriol 1.5 ug tiw   - mircera 200 q2, last 12/28  Assessment/Plan: AMS -multifactorial. Missed HD PTA and ESRD pt recently prescribed Valtrex for shingles (son unaware if renally dosed) and gabapentin(also not sure of dosing) = COMMON  Cause of AMS when given to ESRD pt's at the non-ESRD dose.  Has improved some since dialysis,  Valtrex takes at least 2-3 days to wash out  sometimes longer.  Neurontin effects are not as long. Also meds plus infectious process from pneumonia in ESRD patient was seen by  psych Possible PNA - getting IV abx for suspected CAP, per pmd.  ESRD - on HD MWF.  Back on schedule. HD Today. Follow labs.  BP/ volume - BP high. Back on amlodipine, labetalol, hydralazine.  Not vol overloaded on exam. CXR w/ LLL changes, scarring upper lobes.  Did not tolerate 1/13 HD large UF goal. UF as able.    Anemia ckd - Hb 11.4 no esa needs MBD ckd - cont binder and vdra w/ HD   Lynnda Child PA-C [facility] 03/18/2021,11:14 AM

## 2021-03-18 NOTE — Plan of Care (Signed)
  Problem: Clinical Measurements: Goal: Ability to maintain a body temperature in the normal range will improve Outcome: Progressing   Problem: Respiratory: Goal: Ability to maintain adequate ventilation will improve Outcome: Progressing Goal: Ability to maintain a clear airway will improve Outcome: Progressing   

## 2021-03-18 NOTE — Progress Notes (Signed)
PROGRESS NOTE    Bianca Myers  CNO:[id number] DOB: Aug 05, 1945 DOA: 03/15/2021 PCP: [name], MD   Chief Complaint  Patient presents with   Altered Mental Status   Weakness  Brief Narrative/Hospital Course: Bianca Myers, 76 y.o. female with PMH of ESRD on HD MWF,DM,HTN, depression presenting with AMS. She was treated outpatient for pneumonia 10 days ago, continues to have cough and SOB, was seen again for left flank pain a couple days and started on gabapentin and valacyclovir for suspected zoster without rash, and has since developed worsening speech and cognitive disturbance. She was last dialyzed on Monday. ED workup notable for potassium 6.4, BUN 82, and suspected pneumonia. No rash in ED  Nephrology was consulted by ED and she was given vancomycin and Zosyn. Patient was admitted underwent hemodialysis Valtrex and gabapentin was discontinued.  She was placed on empiric antibiotics for pneumonia. Mental status significantly improved seen by psychiatrist no further recommendation, seen by PT OT and has advised skilled nursing facility due to deconditioning and debility.  Subjective: Seen and examined this morning in dialysis Patient has no complaint. Overnight patient had incontinent episodes multiple times none of them type VII for C. difficile specimen. Perineal care performed by nursing staff. Afebrile overnight blood pressure on higher side  1/15 CT chest showed minimally displaced acute posterior left eighth rib fracture, chronic scarring in the bilateral upper lobes and superior segment of right lower lower lobe.  Assessment & Plan: * Acute metabolic encephalopathy- (present on admission) Multifactorial in the setting of missed dialysis and  2/2 gabapentin and valacyclovir-valacyclovir takes 2 to 3 days for washout.Suspecting acute metabolic encephalopathy.  Mentation overall stable. Cont HD per schedule. Cont LexaproSeen by psychiatry, seen by PT OT.  Plan for  skilled nursing facility.    Pneumonia- (present on admission) Imaging- LLL bronchopneumonia,nodular density in the LUL- CT chest showed minimally displaced acute posterior left eighth rib fracture, chronic scarring in the bilateral upper lobes and superior segment of right lower lower lob.  Continue pain control  End-stage renal disease on hemodialysis ([place]) On HD MWF getting dialysis today.  She missed HD Wednesday-which may have increased her risk of medication reaction, nephro following on board.  On binders but some diarrhea/incontinence.    Stool incontinence Stool incontinence, GI panel negative, add Imodium supportive care  Dyslipidemia- (present on admission) On Lipitor  Diabetes mellitus ([place]) 1c stable 5.2, blood sugar controlled,continue on sliding scale insulin.  Not on meds at home. Recent Labs  Lab 03/15/21 0500 03/15/21 1156 03/15/21 1733 03/15/21 2112 03/16/21 0652  GLUCAP  --  153* 106* 132* 100*  HGBA1C 5.2  --   --   --   --     Essential hypertension- (present on admission) Poorly controlled blood pressure amlodipine dose has been increased continue on labetalol and hydralazine, adjust the dose.  Monitor blood pressure   Anemia of chronic kidney failure, stage 5 ([place])- (present on admission) Hemoglobin stable. Recent Labs  Lab 03/15/21 0500 03/15/21 0514 03/16/21 0642  HGB 13.0 15.0 11.9*  HCT 40.0 44.0 37.2    Diarrhea-resolved as of 03/18/2021 Patient having multiple incontinent episodes, GI panel pending.  Supportive care, PT care, will need to hold on binders, can try imodium  DVT prophylaxis: heparin injection 5,000 Units Start: 03/15/21 1400 Code Status:   Code Status: Full Code Family Communication: plan of care discussed with patient at bedside. Status is: Inpatient Remains inpatient appropriate because: Due to ongoing management pneumonia Disposition: Currently is medically  stable for discharge. Anticipated Disposition: SNF once bed  available  Total time spent in the care of this patient  35 MINUTES Objective: Vitals last 24 hrs: Vitals:   03/18/21 1230 03/18/21 1300 03/18/21 1304 03/18/21 1359  BP: (!) 105/51 125/60 (!) 117/45 133/69  Pulse: 75 (!) 53 (!) 51 64  Resp: 19 18 (!) 22 18  Temp:   98.3 F (36.8 C) 98.6 F (37 C)  TempSrc:   Temporal Oral  SpO2: 100% 100% 100% 93%  Weight:   73.9 kg   Height:       Weight change:   Intake/Output Summary (Last 24 hours) at 03/18/2021 1424 Last data filed at 03/18/2021 1304 Gross per 24 hour  Intake 100 ml  Output 2247 ml  Net -2147 ml   Net IO Since Admission: -3,377.49 mL [03/18/21 1424]   Physical Examination: General exam: AAOx, pleasant, not in distress, older than stated age, weak appearing. HEENT:Oral mucosa moist, Ear/Nose WNL grossly, dentition normal. Respiratory system: bilaterally clear, no use of accessory muscle Cardiovascular system: S1 & S2 +, No JVD,. Gastrointestinal system: Abdomen soft,NT,ND, BS+ Nervous System:Alert, awake, moving extremities and grossly nonfocal Extremities: No edema, distal peripheral pulses palpable.  Skin: No rashes,no icterus. MSK: Normal muscle bulk,tone, power   Medications reviewed: Scheduled Meds:  amLODipine  10 mg Oral Daily   atorvastatin  10 mg Oral QPM   azithromycin  500 mg Oral Daily   calcitRIOL  1.5 mcg Oral Q M,W,F-HD   Chlorhexidine Gluconate Cloth  6 each Topical Q0600   docusate sodium  100 mg Oral BID   escitalopram  10 mg Oral Daily   ferric citrate  420 mg Oral TID WC   heparin  5,000 Units Subcutaneous Q8H   hydrALAZINE  25 mg Oral Q8H   insulin aspart  0-6 Units Subcutaneous TID WC   labetalol  200 mg Oral BID   multivitamin  1 tablet Oral QHS   sodium chloride flush  3 mL Intravenous Q12H   Continuous Infusions:  cefTRIAXone (ROCEPHIN)  IV 2 g (03/17/21 0835)   lidocaine-prilocaine     Diet Order             Diet renal with fluid restriction Fluid restriction: 1200 mL Fluid;  Room service appropriate? Yes with Assist; Fluid consistency: Thin  Diet effective ____                 Weight change:   Wt Readings from Last 3 Encounters:  03/18/21 73.9 kg  12/26/19 74.8 kg  07/28/19 77.1 kg     Consultants:see note  Procedures:see note Antimicrobials: Anti-infectives (From admission, onward)    Start     Dose/Rate Route Frequency Ordered Stop   03/16/21 1000  azithromycin (ZITHROMAX) tablet 500 mg        500 mg Oral Daily 03/16/21 0753 03/20/21 0959   03/15/21 1000  cefTRIAXone (ROCEPHIN) 2 g in sodium chloride 0.9 % 100 mL IVPB        2 g 200 mL/hr over 30 Minutes Intravenous Every 24 hours 03/15/21 0946 03/20/21 0959   03/15/21 1000  azithromycin (ZITHROMAX) 500 mg in sodium chloride 0.9 % 250 mL IVPB  Status:  Discontinued        500 mg 250 mL/hr over 60 Minutes Intravenous Every 24 hours 03/15/21 0946 03/16/21 0753   03/15/21 0630  vancomycin (VANCOREADY) IVPB 1500 mg/300 mL        1,500 mg 150 mL/hr over 120 Minutes Intravenous  Once 03/15/21 0621 03/15/21 1030   03/15/21 0630  piperacillin-tazobactam (ZOSYN) IVPB 3.375 g        3.375 g 100 mL/hr over 30 Minutes Intravenous  Once 03/15/21 0621 03/15/21 0817      Culture/Microbiology    Component Value Date/Time   SDES BLOOD RIGHT HAND 03/16/2021 0642   SPECREQUEST  03/16/2021 0642    BOTTLES DRAWN AEROBIC ONLY Blood Culture adequate volume   CULT  03/16/2021 0642    NO GROWTH 2 DAYS Performed at [facility], [place] [address] [place],  [address]    REPTSTATUS PENDING 03/16/2021 4967    Other culture-see note  Unresulted Labs (From admission, onward)     Start     Ordered   03/17/21 5916  Basic metabolic panel  Daily,   R     Question:  Specimen collection method  Answer:  Lab=Lab collect   03/16/21 1106   03/15/21 0453  Urinalysis, Routine w reflex microscopic  Once,   STAT        03/15/21 0452   03/15/21 0453  Rapid urine drug screen (hospital performed)  ONCE - STAT,    STAT        03/15/21 0452          Data Reviewed: I have personally reviewed following labs and imaging studies CBC: Recent Labs  Lab 03/15/21 0500 03/15/21 0514 03/16/21 0642 03/18/21 1020  WBC 9.6  --  8.0 6.8  NEUTROABS 7.9*  --   --   --   HGB 13.0 15.0 11.9* 11.4*  HCT 40.0 44.0 37.2 34.2*  MCV 91.7  --  90.1 89.1  PLT 293  --  270 384   Basic Metabolic Panel: Recent Labs  Lab 03/15/21 0500 03/15/21 0514 03/16/21 0642 03/17/21 0730 03/18/21 0621 03/18/21 1020  NA 134* 131* 137 139 133* 134*  K 6.4* 6.2* 4.9 4.6 4.3 4.4  CL 89* 98 93* 95* 96* 94*  CO2 20*  --  26 26 21* 22  GLUCOSE 185* 184* 101* 98 92 95  BUN 82* 74* 33* 49* 56* 58*  CREATININE 12.97* 14.00* 7.35* 9.63* 11.26* 11.58*  CALCIUM 9.4  --  9.0 8.7* 9.3 8.8*  PHOS  --   --   --   --   --  7.1*   GFR: Estimated Creatinine Clearance: 4.1 mL/min (A) (by C-G formula based on SCr of 11.58 mg/dL (H)). Liver Function Tests: Recent Labs  Lab 03/15/21 0500 03/18/21 1020  AST 19  --   ALT 13  --   ALKPHOS 72  --   BILITOT 3.7*  --   PROT 7.8  --   ALBUMIN 3.9 3.2*   No results for input(s): LIPASE, AMYLASE in the last 168 hours. Recent Labs  Lab 03/15/21 0500  AMMONIA 13   Coagulation Profile: No results for input(s): INR, PROTIME in the last 168 hours. Cardiac Enzymes: No results for input(s): CKTOTAL, CKMB, CKMBINDEX, TROPONINI in the last 168 hours. BNP (last 3 results) No results for input(s): PROBNP in the last 8760 hours. HbA1C: No results for input(s): HGBA1C in the last 72 hours.  CBG: Recent Labs  Lab 03/17/21 1157 03/17/21 1645 03/17/21 2221 03/18/21 0746 03/18/21 1357  GLUCAP 136* 122* 125* 91 95   Lipid Profile: No results for input(s): CHOL, HDL, LDLCALC, TRIG, CHOLHDL, LDLDIRECT in the last 72 hours. Thyroid Function Tests: No results for input(s): TSH, T4TOTAL, FREET4, T3FREE, THYROIDAB in the last 72 hours. Anemia  Panel: No results for input(s): VITAMINB12,  FOLATE, FERRITIN, TIBC, IRON, RETICCTPCT in the last 72 hours. Sepsis Labs: Recent Labs  Lab 03/15/21 0500  LATICACIDVEN 1.4    Recent Results (from the past 240 hour(s))  Resp Panel by RT-PCR (Flu A&B, Covid) Nasopharyngeal Swab     Status: None   Collection Time: 03/15/21  4:52 AM   Specimen: Nasopharyngeal Swab; Nasopharyngeal(NP) swabs in vial transport medium  Result Value Ref Range Status   SARS Coronavirus 2 by RT PCR NEGATIVE NEGATIVE Final    Comment: (NOTE) SARS-CoV-2 target nucleic acids are NOT DETECTED.  The SARS-CoV-2 RNA is generally detectable in upper respiratory specimens during the acute phase of infection. The lowest concentration of SARS-CoV-2 viral copies this assay can detect is 138 copies/mL. A negative result does not preclude SARS-Cov-2 infection and should not be used as the sole basis for treatment or other patient management decisions. A negative result may occur with  improper specimen collection/handling, submission of specimen other than nasopharyngeal swab, presence of viral mutation(s) within the areas targeted by this assay, and inadequate number of viral copies(<138 copies/mL). A negative result must be combined with clinical observations, patient history, and epidemiological information. The expected result is Negative.  Fact Sheet for Patients:  EntrepreneurPulse.com.au  Fact Sheet for Healthcare Providers:  IncredibleEmployment.be  This test is no t yet approved or cleared by the [place] FDA and  has been authorized for detection and/or diagnosis of SARS-CoV-2 by FDA under an Emergency Use Authorization (EUA). This EUA will remain  in effect (meaning this test can be used) for the duration of the COVID-19 declaration under Section 564(b)(1) of the Act, 21 U.S.C.section 360bbb-3(b)(1), unless the authorization is terminated  or revoked sooner.       Influenza A by PCR NEGATIVE NEGATIVE Final    Influenza B by PCR NEGATIVE NEGATIVE Final    Comment: (NOTE) The Xpert Xpress SARS-CoV-2/FLU/RSV plus assay is intended as an aid in the diagnosis of influenza from Nasopharyngeal swab specimens and should not be used as a sole basis for treatment. Nasal washings and aspirates are unacceptable for Xpert Xpress SARS-CoV-2/FLU/RSV testing.  Fact Sheet for Patients: EntrepreneurPulse.com.au  Fact Sheet for Healthcare Providers: IncredibleEmployment.be  This test is not yet approved or cleared by the [place] FDA and has been authorized for detection and/or diagnosis of SARS-CoV-2 by FDA under an Emergency Use Authorization (EUA). This EUA will remain in effect (meaning this test can be used) for the duration of the COVID-19 declaration under Section 564(b)(1) of the Act, 21 U.S.C. section 360bbb-3(b)(1), unless the authorization is terminated or revoked.  Performed at [facility], [place] [address], [place], [place] [address]   Blood culture (routine x 2)     Status: None (Preliminary result)   Collection Time: 03/15/21  6:24 AM   Specimen: BLOOD  Result Value Ref Range Status   Specimen Description BLOOD BLOOD RIGHT FOREARM  Final   Special Requests   Final    BOTTLES DRAWN AEROBIC AND ANAEROBIC Blood Culture adequate volume   Culture   Final    NO GROWTH 3 DAYS Performed at [facility], [place] [address] [place], [place] [address]    Report Status PENDING  Incomplete  MRSA Next Gen by PCR, Nasal     Status: None   Collection Time: 03/15/21  8:25 AM   Specimen: Nasal Mucosa; Nasal Swab  Result Value Ref Range Status   MRSA by PCR Next Gen  NOT DETECTED NOT DETECTED Final    Comment: (NOTE) The GeneXpert MRSA Assay (FDA approved for NASAL specimens only), is one component of a comprehensive MRSA colonization surveillance program. It is not intended to diagnose MRSA infection nor to guide or monitor treatment for MRSA  infections. Test performance is not FDA approved in patients less than 91 years old. Performed at [facility], [place] [address] [place], [place] [address]   Blood culture (routine x 2)     Status: None (Preliminary result)   Collection Time: 03/16/21  6:42 AM   Specimen: BLOOD RIGHT HAND  Result Value Ref Range Status   Specimen Description BLOOD RIGHT HAND  Final   Special Requests   Final    BOTTLES DRAWN AEROBIC ONLY Blood Culture adequate volume   Culture   Final    NO GROWTH 2 DAYS Performed at [facility], [place] [address], [place], [place] [address]    Report Status PENDING  Incomplete  Gastrointestinal Panel by PCR , Stool     Status: None   Collection Time: 03/17/21  2:46 PM   Specimen: Stool  Result Value Ref Range Status   Campylobacter species NOT DETECTED NOT DETECTED Final   Plesimonas shigelloides NOT DETECTED NOT DETECTED Final   Salmonella species NOT DETECTED NOT DETECTED Final   Yersinia enterocolitica NOT DETECTED NOT DETECTED Final   Vibrio species NOT DETECTED NOT DETECTED Final   Vibrio cholerae NOT DETECTED NOT DETECTED Final   Enteroaggregative E coli (EAEC) NOT DETECTED NOT DETECTED Final   Enteropathogenic E coli (EPEC) NOT DETECTED NOT DETECTED Final   Enterotoxigenic E coli (ETEC) NOT DETECTED NOT DETECTED Final   Shiga like toxin producing E coli (STEC) NOT DETECTED NOT DETECTED Final   Shigella/Enteroinvasive E coli (EIEC) NOT DETECTED NOT DETECTED Final   Cryptosporidium NOT DETECTED NOT DETECTED Final   Cyclospora cayetanensis NOT DETECTED NOT DETECTED Final   Entamoeba histolytica NOT DETECTED NOT DETECTED Final   Giardia lamblia NOT DETECTED NOT DETECTED Final   Adenovirus F40/41 NOT DETECTED NOT DETECTED Final   Astrovirus NOT DETECTED NOT DETECTED Final   Norovirus GI/GII NOT DETECTED NOT DETECTED Final   Rotavirus A NOT DETECTED NOT DETECTED Final   Sapovirus (I, II, IV, and V) NOT DETECTED NOT DETECTED Final    Comment:  Performed at [facility], [address] [place],  [address]     Radiology Studies: CT CHEST WO CONTRAST  Result Date: 03/17/2021 CLINICAL DATA:  Pulmonary nodules, abnormal chest x-ray EXAM: CT CHEST WITHOUT CONTRAST TECHNIQUE: Multidetector CT imaging of the chest was performed following the standard protocol without IV contrast. RADIATION DOSE REDUCTION: This exam was performed according to the departmental dose-optimization program which includes automated exposure control, adjustment of the mA and/or kV according to patient size and/or use of iterative reconstruction technique. COMPARISON:  12/18/2019, 03/15/2021 FINDINGS: Cardiovascular: Unenhanced imaging of the heart and great vessels demonstrates no significant pericardial effusion. There is normal caliber of the thoracic aorta. Mild diffuse atherosclerosis of the aorta and coronary vasculature. Evaluation of the vascular lumen is limited without IV contrast. Mediastinum/Nodes: No enlarged mediastinal or axillary lymph nodes. Thyroid gland, trachea, and esophagus demonstrate no significant findings. Lungs/Pleura: Chronic scarring is seen within the bilateral upper lobes and superior segment right lower lobe. The left upper lobe scarring likely accounts for the nodular density seen on recent chest x-ray. Trace left pleural effusion. No acute airspace disease. No pneumothorax. The central airways are patent.  Upper Abdomen: Stable splenic cyst. Chronic renal atrophy consistent with end-stage renal disease. Musculoskeletal: Minimally displaced acute left posterior eighth rib fracture. Numerous prior healed right rib fractures. Reconstructed images demonstrate no additional findings. IMPRESSION: 1. Minimally displaced acute posterior left eighth rib fracture. 2. Trace left pleural effusion. 3. Chronic scarring within the bilateral upper lobes and superior segment right lower lobe. The left upper lobe scarring corresponds to the chest  x-ray finding. 4.  Aortic Atherosclerosis (ICD10-I70.0). Electronically Signed   By: Randa Ngo M.D.   On: 03/17/2021 15:52     LOS: 3 days   Antonieta Pert, MD [facility]  03/18/2021, 2:24 PM

## 2021-03-18 NOTE — Progress Notes (Signed)
Pt receives out-pt HD at [facility] on MWF. Pt arrives at 5:40 for 6:00 chair time. Will assist as needed.  Melven Sartorius Renal Navigator [phone]

## 2021-03-18 NOTE — Progress Notes (Signed)
Patient has had multiple incontinent episodes tonight with none of them bristol 7 to send for c-diff speciment. Patient's labia and perineum are grossly edematous and raw. Cleaning the patient has been very difficult and painful for her. Skin integrity impaired with minimal bleeding. Barrier cream and butt cream applied heavily.

## 2021-03-18 NOTE — Assessment & Plan Note (Signed)
Stool incontinence, GI panel negative, add Imodium supportive care

## 2021-03-19 LAB — BASIC METABOLIC PANEL
Anion gap: 14 (ref 5–15)
BUN: 31 mg/dL — ABNORMAL HIGH (ref 8–23)
CO2: 26 mmol/L (ref 22–32)
Calcium: 8.7 mg/dL — ABNORMAL LOW (ref 8.9–10.3)
Chloride: 95 mmol/L — ABNORMAL LOW (ref 98–111)
Creatinine, Ser: 7.46 mg/dL — ABNORMAL HIGH (ref 0.44–1.00)
GFR, Estimated: 5 mL/min — ABNORMAL LOW (ref >60)
Glucose, Bld: 108 mg/dL — ABNORMAL HIGH (ref 70–99)
Potassium: 4.1 mmol/L (ref 3.5–5.1)
Sodium: 135 mmol/L (ref 135–145)

## 2021-03-19 LAB — GLUCOSE, CAPILLARY
Glucose-Capillary: 95 mg/dL (ref 70–99)
Glucose-Capillary: 131 mg/dL — ABNORMAL HIGH (ref 70–99)
Glucose-Capillary: 141 mg/dL — ABNORMAL HIGH (ref 70–99)
Glucose-Capillary: 98 mg/dL (ref 70–99)
Glucose-Capillary: 127 mg/dL — ABNORMAL HIGH (ref 70–99)

## 2021-03-19 MED ORDER — LOSARTAN POTASSIUM 25 MG PO TABS
25.0000 mg | ORAL_TABLET | Freq: Every day | ORAL | Status: DC
  Administered 2021-03-19: 25 mg via ORAL
  Filled 2021-03-19: qty 1

## 2021-03-19 NOTE — Progress Notes (Signed)
PROGRESS NOTE    Bianca Myers  GLO:[id number] DOB: 1945/04/11 DOA: 03/15/2021 PCP: [name], MD   Chief Complaint  Patient presents with   Altered Mental Status   Weakness  Brief Narrative/Hospital Course: Bianca Myers, 76 y.o. female with PMH of ESRD on HD MWF,DM,HTN, depression presenting with AMS. She was treated outpatient for pneumonia 10 days ago, continues to have cough and SOB, was seen again for left flank pain a couple days and started on gabapentin and valacyclovir for suspected zoster without rash, and has since developed worsening speech and cognitive disturbance. She was last dialyzed on Monday. ED workup notable for potassium 6.4, BUN 82, and suspected pneumonia. No rash in ED  Nephrology was consulted by ED and she was given vancomycin and Zosyn. Patient was admitted underwent hemodialysis Valtrex and gabapentin was discontinued.  She was placed on empiric antibiotics for pneumonia. Mental status significantly improved seen by psychiatrist no further recommendation, seen by PT OT and has advised skilled nursing facility due to deconditioning and debility.  Subjective: Seen/examined this morning.  Resting comfortably she has no new complaints Overnight no fever.  Labs with a stable potassium. Doing well on room air Incontinent episode decreased to 3x  Assessment & Plan: Acute metabolic encephalopathy- (present on admission) Multifactorial in the setting of missed dialysis and  2/2 gabapentin and valacyclovir-valacyclovir takes 2 to 3 days for washout.Suspecting acute metabolic encephalopathy.  Mentation overall stable. Cont HD per schedule. Cont LexaproSeen by psychiatry, seen by PT OT.  Plan for skilled nursing facility.  Pneumonia: Imaging- LLL bronchopneumonia,nodular density in the LUL- CT chest showed minimally displaced acute posterior left eighth rib fracture, chronic scarring in the bilateral upper lobes and superior segment of right lower lower lob.   Continue pain control  Acute posterior left eighth rib fracture: Patient has been complaining pain for several days as per the son possible fall at home as she has had a fall previously causing fractures in the past.  Cont  lidocaine patch ,continue pain control.  ESRD:On HD MWF getting dialysis today.She missed HD Wednesday-which may have increased her risk of medication reaction, nephro following on board.  On binders but some diarrhea/incontinence.   Stool incontinence:Stool incontinence, GI panel negative, added Imodium supportive care, stool output decreasing  Dyslipidemia: cont Lipitor  Diabetes mellitus:1c stable 5.2, blood sugar well controlled continue SSI for now.Not on meds at home. Recent Labs  Lab 03/15/21 0500 03/15/21 1156 03/15/21 1733 03/15/21 2112 03/16/21 0652  GLUCAP  --  153* 106* 132* 100*  HGBA1C 5.2  --   --   --   --    Essential hypertension: Has been poorly controlled cont on increased dose of amlodipine,labetalol, started  on losartan 25 mg, cont hydralazine, adjust the dose.Monitor BP.  Anemia of chronic kidney disease-stable Recent Labs  Lab 03/15/21 0500 03/15/21 0514 03/16/21 0642  HGB 13.0 15.0 11.9*  HCT 40.0 44.0 37.2    DVT prophylaxis: heparin injection 5,000 Units Start: 03/15/21 1400 Code Status:   Code Status: Full Code Family Communication: plan of care discussed with patient at bedside. Status is: Inpatient Remains inpatient appropriate because: Due to ongoing management pneumonia Disposition: Currently is medically stable for discharge. Anticipated Disposition: SNF once bed available  Total time spent in the care of this patient  35 MINUTES Objective: Vitals last 24 hrs: Vitals:   03/18/21 1648 03/18/21 2054 03/18/21 2136 03/19/21 0633  BP: (!) 143/67 (!) 154/95 (!) 147/91 (!) 156/80  Pulse: 72 73 Marland Kitchen)  44 66  Resp: 18 18 18 18   Temp: 98 F (36.7 C) 98.9 F (37.2 C) 98.7 F (37.1 C) 98.1 F (36.7 C)  TempSrc:  Oral Oral  Oral  SpO2: 98% 96% 97% 98%  Weight:    74.4 kg  Height:       Weight change:   Intake/Output Summary (Last 24 hours) at 03/19/2021 0804 Last data filed at 03/19/2021 0700 Gross per 24 hour  Intake 460 ml  Output 2247 ml  Net -1787 ml    Net IO Since Admission: -2,917.49 mL [03/19/21 0804]   Physical Examination: General exam: AAOx3,older than stated age, weak appearing. HEENT:Oral mucosa moist, Ear/Nose WNL grossly, dentition normal. Respiratory system: bilaterally CLEAR, no use of accessory muscle Cardiovascular system: S1 & S2 +, No JVD,. Gastrointestinal system: Abdomen soft NT,ND, BS+ Nervous System:Alert, awake, moving extremities and grossly nonfocal Extremities: No edema, distal peripheral pulses palpable.  Skin: No rashes,no icterus. MSK: Normal muscle bulk,tone, power   Medications reviewed: Scheduled Meds:  amLODipine  10 mg Oral Daily   atorvastatin  10 mg Oral QPM   azithromycin  500 mg Oral Daily   calcitRIOL  1.5 mcg Oral Q M,W,F-HD   Chlorhexidine Gluconate Cloth  6 each Topical Q0600   docusate sodium  100 mg Oral BID   escitalopram  10 mg Oral Daily   ferric citrate  420 mg Oral TID WC   heparin  5,000 Units Subcutaneous Q8H   hydrALAZINE  25 mg Oral Q8H   insulin aspart  0-6 Units Subcutaneous TID WC   labetalol  200 mg Oral BID   lidocaine  1 patch Transdermal Q24H   losartan  25 mg Oral Daily   multivitamin  1 tablet Oral QHS   sodium chloride flush  3 mL Intravenous Q12H   Continuous Infusions:  cefTRIAXone (ROCEPHIN)  IV 2 g (03/18/21 1815)   lidocaine-prilocaine     Diet Order             Diet renal with fluid restriction Fluid restriction: 1200 mL Fluid; Room service appropriate? Yes with Assist; Fluid consistency: Thin  Diet effective ____                 Weight change:   Wt Readings from Last 3 Encounters:  03/19/21 74.4 kg  12/26/19 74.8 kg  07/28/19 77.1 kg     Consultants:see note  Procedures:see  note Antimicrobials: Anti-infectives (From admission, onward)    Start     Dose/Rate Route Frequency Ordered Stop   03/16/21 1000  azithromycin (ZITHROMAX) tablet 500 mg        500 mg Oral Daily 03/16/21 0753 03/20/21 0959   03/15/21 1000  cefTRIAXone (ROCEPHIN) 2 g in sodium chloride 0.9 % 100 mL IVPB        2 g 200 mL/hr over 30 Minutes Intravenous Every 24 hours 03/15/21 0946 03/20/21 0959   03/15/21 1000  azithromycin (ZITHROMAX) 500 mg in sodium chloride 0.9 % 250 mL IVPB  Status:  Discontinued        500 mg 250 mL/hr over 60 Minutes Intravenous Every 24 hours 03/15/21 0946 03/16/21 0753   03/15/21 0630  vancomycin (VANCOREADY) IVPB 1500 mg/300 mL        1,500 mg 150 mL/hr over 120 Minutes Intravenous  Once 03/15/21 0621 03/15/21 1030   03/15/21 0630  piperacillin-tazobactam (ZOSYN) IVPB 3.375 g        3.375 g 100 mL/hr over 30 Minutes Intravenous  Once 03/15/21  0867 03/15/21 0817      Culture/Microbiology    Component Value Date/Time   SDES BLOOD RIGHT HAND 03/16/2021 0642   SPECREQUEST  03/16/2021 0642    BOTTLES DRAWN AEROBIC ONLY Blood Culture adequate volume   CULT  03/16/2021 0642    NO GROWTH 2 DAYS Performed at [facility], [place] [address], [place], [place] [address]    REPTSTATUS PENDING 03/16/2021 9326    Other culture-see note  Unresulted Labs (From admission, onward)     Start     Ordered   03/15/21 0453  Urinalysis, Routine w reflex microscopic  Once,   STAT        03/15/21 0452   03/15/21 0453  Rapid urine drug screen (hospital performed)  ONCE - STAT,   STAT        03/15/21 0452          Data Reviewed: I have personally reviewed following labs and imaging studies CBC: Recent Labs  Lab 03/15/21 0500 03/15/21 0514 03/16/21 0642 03/18/21 1020  WBC 9.6  --  8.0 6.8  NEUTROABS 7.9*  --   --   --   HGB 13.0 15.0 11.9* 11.4*  HCT 40.0 44.0 37.2 34.2*  MCV 91.7  --  90.1 89.1  PLT 293  --  270 712    Basic Metabolic Panel: Recent Labs   Lab 03/16/21 0642 03/17/21 0730 03/18/21 0621 03/18/21 1020 03/19/21 0324  NA 137 139 133* 134* 135  K 4.9 4.6 4.3 4.4 4.1  CL 93* 95* 96* 94* 95*  CO2 26 26 21* 22 26  GLUCOSE 101* 98 92 95 108*  BUN 33* 49* 56* 58* 31*  CREATININE 7.35* 9.63* 11.26* 11.58* 7.46*  CALCIUM 9.0 8.7* 9.3 8.8* 8.7*  PHOS  --   --   --  7.1*  --     GFR: Estimated Creatinine Clearance: 6.9 mL/min (A) (by C-G formula based on SCr of 7.46 mg/dL (H)). Liver Function Tests: Recent Labs  Lab 03/15/21 0500 03/18/21 1020  AST 19  --   ALT 13  --   ALKPHOS 72  --   BILITOT 3.7*  --   PROT 7.8  --   ALBUMIN 3.9 3.2*    No results for input(s): LIPASE, AMYLASE in the last 168 hours. Recent Labs  Lab 03/15/21 0500  AMMONIA 13    Coagulation Profile: No results for input(s): INR, PROTIME in the last 168 hours. Cardiac Enzymes: No results for input(s): CKTOTAL, CKMB, CKMBINDEX, TROPONINI in the last 168 hours. BNP (last 3 results) No results for input(s): PROBNP in the last 8760 hours. HbA1C: No results for input(s): HGBA1C in the last 72 hours.  CBG: Recent Labs  Lab 03/18/21 0746 03/18/21 1357 03/18/21 1647 03/18/21 2135 03/19/21 0632  GLUCAP 91 95 164* 110* 95    Lipid Profile: No results for input(s): CHOL, HDL, LDLCALC, TRIG, CHOLHDL, LDLDIRECT in the last 72 hours. Thyroid Function Tests: No results for input(s): TSH, T4TOTAL, FREET4, T3FREE, THYROIDAB in the last 72 hours. Anemia Panel: No results for input(s): VITAMINB12, FOLATE, FERRITIN, TIBC, IRON, RETICCTPCT in the last 72 hours. Sepsis Labs: Recent Labs  Lab 03/15/21 0500  LATICACIDVEN 1.4     Recent Results (from the past 240 hour(s))  Resp Panel by RT-PCR (Flu A&B, Covid) Nasopharyngeal Swab     Status: None   Collection Time: 03/15/21  4:52 AM   Specimen: Nasopharyngeal Swab; Nasopharyngeal(NP) swabs in vial transport medium  Result Value  Ref Range Status   SARS Coronavirus 2 by RT PCR NEGATIVE NEGATIVE  Final    Comment: (NOTE) SARS-CoV-2 target nucleic acids are NOT DETECTED.  The SARS-CoV-2 RNA is generally detectable in upper respiratory specimens during the acute phase of infection. The lowest concentration of SARS-CoV-2 viral copies this assay can detect is 138 copies/mL. A negative result does not preclude SARS-Cov-2 infection and should not be used as the sole basis for treatment or other patient management decisions. A negative result may occur with  improper specimen collection/handling, submission of specimen other than nasopharyngeal swab, presence of viral mutation(s) within the areas targeted by this assay, and inadequate number of viral copies(<138 copies/mL). A negative result must be combined with clinical observations, patient history, and epidemiological information. The expected result is Negative.  Fact Sheet for Patients:  EntrepreneurPulse.com.au  Fact Sheet for Healthcare Providers:  IncredibleEmployment.be  This test is no t yet approved or cleared by the [place] FDA and  has been authorized for detection and/or diagnosis of SARS-CoV-2 by FDA under an Emergency Use Authorization (EUA). This EUA will remain  in effect (meaning this test can be used) for the duration of the COVID-19 declaration under Section 564(b)(1) of the Act, 21 U.S.C.section 360bbb-3(b)(1), unless the authorization is terminated  or revoked sooner.       Influenza A by PCR NEGATIVE NEGATIVE Final   Influenza B by PCR NEGATIVE NEGATIVE Final    Comment: (NOTE) The Xpert Xpress SARS-CoV-2/FLU/RSV plus assay is intended as an aid in the diagnosis of influenza from Nasopharyngeal swab specimens and should not be used as a sole basis for treatment. Nasal washings and aspirates are unacceptable for Xpert Xpress SARS-CoV-2/FLU/RSV testing.  Fact Sheet for Patients: EntrepreneurPulse.com.au  Fact Sheet for Healthcare  Providers: IncredibleEmployment.be  This test is not yet approved or cleared by the [place] FDA and has been authorized for detection and/or diagnosis of SARS-CoV-2 by FDA under an Emergency Use Authorization (EUA). This EUA will remain in effect (meaning this test can be used) for the duration of the COVID-19 declaration under Section 564(b)(1) of the Act, 21 U.S.C. section 360bbb-3(b)(1), unless the authorization is terminated or revoked.  Performed at [facility], [place] [address] [place], [place] [address]   Blood culture (routine x 2)     Status: None (Preliminary result)   Collection Time: 03/15/21  6:24 AM   Specimen: BLOOD  Result Value Ref Range Status   Specimen Description BLOOD BLOOD RIGHT FOREARM  Final   Special Requests   Final    BOTTLES DRAWN AEROBIC AND ANAEROBIC Blood Culture adequate volume   Culture   Final    NO GROWTH 3 DAYS Performed at [facility], [place] [address], [place], [place] [address]    Report Status PENDING  Incomplete  MRSA Next Gen by PCR, Nasal     Status: None   Collection Time: 03/15/21  8:25 AM   Specimen: Nasal Mucosa; Nasal Swab  Result Value Ref Range Status   MRSA by PCR Next Gen NOT DETECTED NOT DETECTED Final    Comment: (NOTE) The GeneXpert MRSA Assay (FDA approved for NASAL specimens only), is one component of a comprehensive MRSA colonization surveillance program. It is not intended to diagnose MRSA infection nor to guide or monitor treatment for MRSA infections. Test performance is not FDA approved in patients less than 60 years old. Performed at [facility], [place] [address], [place], [place] [address]  Blood culture (routine x 2)     Status: None (Preliminary result)   Collection Time: 03/16/21  6:42 AM   Specimen: BLOOD RIGHT HAND  Result Value Ref Range Status   Specimen Description BLOOD RIGHT HAND  Final   Special Requests   Final    BOTTLES DRAWN AEROBIC ONLY Blood Culture  adequate volume   Culture   Final    NO GROWTH 2 DAYS Performed at [facility], [address] [place], [place] [address]    Report Status PENDING  Incomplete  Gastrointestinal Panel by PCR , Stool     Status: None   Collection Time: 03/17/21  2:46 PM   Specimen: Stool  Result Value Ref Range Status   Campylobacter species NOT DETECTED NOT DETECTED Final   Plesimonas shigelloides NOT DETECTED NOT DETECTED Final   Salmonella species NOT DETECTED NOT DETECTED Final   Yersinia enterocolitica NOT DETECTED NOT DETECTED Final   Vibrio species NOT DETECTED NOT DETECTED Final   Vibrio cholerae NOT DETECTED NOT DETECTED Final   Enteroaggregative E coli (EAEC) NOT DETECTED NOT DETECTED Final   Enteropathogenic E coli (EPEC) NOT DETECTED NOT DETECTED Final   Enterotoxigenic E coli (ETEC) NOT DETECTED NOT DETECTED Final   Shiga like toxin producing E coli (STEC) NOT DETECTED NOT DETECTED Final   Shigella/Enteroinvasive E coli (EIEC) NOT DETECTED NOT DETECTED Final   Cryptosporidium NOT DETECTED NOT DETECTED Final   Cyclospora cayetanensis NOT DETECTED NOT DETECTED Final   Entamoeba histolytica NOT DETECTED NOT DETECTED Final   Giardia lamblia NOT DETECTED NOT DETECTED Final   Adenovirus F40/41 NOT DETECTED NOT DETECTED Final   Astrovirus NOT DETECTED NOT DETECTED Final   Norovirus GI/GII NOT DETECTED NOT DETECTED Final   Rotavirus A NOT DETECTED NOT DETECTED Final   Sapovirus (I, II, IV, and V) NOT DETECTED NOT DETECTED Final    Comment: Performed at [facility], [address], [place], [place] [address]      Radiology Studies: CT CHEST WO CONTRAST  Result Date: 03/17/2021 CLINICAL DATA:  Pulmonary nodules, abnormal chest x-ray EXAM: CT CHEST WITHOUT CONTRAST TECHNIQUE: Multidetector CT imaging of the chest was performed following the standard protocol without IV contrast. RADIATION DOSE REDUCTION: This exam was performed according to the departmental dose-optimization  program which includes automated exposure control, adjustment of the mA and/or kV according to patient size and/or use of iterative reconstruction technique. COMPARISON:  12/18/2019, 03/15/2021 FINDINGS: Cardiovascular: Unenhanced imaging of the heart and great vessels demonstrates no significant pericardial effusion. There is normal caliber of the thoracic aorta. Mild diffuse atherosclerosis of the aorta and coronary vasculature. Evaluation of the vascular lumen is limited without IV contrast. Mediastinum/Nodes: No enlarged mediastinal or axillary lymph nodes. Thyroid gland, trachea, and esophagus demonstrate no significant findings. Lungs/Pleura: Chronic scarring is seen within the bilateral upper lobes and superior segment right lower lobe. The left upper lobe scarring likely accounts for the nodular density seen on recent chest x-ray. Trace left pleural effusion. No acute airspace disease. No pneumothorax. The central airways are patent. Upper Abdomen: Stable splenic cyst. Chronic renal atrophy consistent with end-stage renal disease. Musculoskeletal: Minimally displaced acute left posterior eighth rib fracture. Numerous prior healed right rib fractures. Reconstructed images demonstrate no additional findings. IMPRESSION: 1. Minimally displaced acute posterior left eighth rib fracture. 2. Trace left pleural effusion. 3. Chronic scarring within the bilateral upper lobes and superior segment right lower lobe. The left upper lobe scarring corresponds to the chest x-ray finding.  4.  Aortic Atherosclerosis (ICD10-I70.0). Electronically Signed   By: Randa Ngo M.D.   On: 03/17/2021 15:52     LOS: 4 days   Antonieta Pert, MD [facility]  03/19/2021, 8:04 AM

## 2021-03-19 NOTE — NC FL2 (Signed)
[place] LEVEL OF CARE SCREENING TOOL     IDENTIFICATION  Patient Name: Bianca Myers Birthdate: 10/01/1945 Sex: female Admission Date (Current Location): 03/15/2021  [facility] and [place] Number:  Herbalist and Address:  [facility], [place] [address], [place], [place] [address]      Provider Number: [id number]  Attending Physician Name and Address:  Antonieta Pert, MD  Relative Name and Phone Number:  Doristine Bosworth, [phone]    Current Level of Care: Hospital Recommended Level of Care: [place] Prior Approval Number:    Date Approved/Denied:   PASRR Number: pending  Discharge Plan: SNF    Current Diagnoses: Patient Active Problem List   Diagnosis Date Noted   Stool incontinence 03/18/2021   Psychomotor agitation 03/16/2021   Pneumonia 64/68/0321   Acute metabolic encephalopathy 22/48/2500   Dyslipidemia 03/15/2021   Fall 12/19/2019   Multiple rib fractures 12/19/2019   Hyperkalemia 12/19/2019   Thoracic ascending aortic aneurysm 12/19/2019   Left displaced femoral neck fracture ([place])    Syncope 06/07/2019   MSSA bacteremia 03/10/2019   Diabetes mellitus ([place]) 03/10/2019   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   Mild aortic stenosis 03/10/2019   HCAP (healthcare-associated pneumonia) 03/09/2019   Heart failure ([place]) 08/31/2018   Anemia of chronic kidney failure, stage 5 ([place]) 08/27/2018   Chronic depression 08/27/2018   Patient is Jehovah's Witness 08/27/2018   Essential hypertension    End-stage renal disease on hemodialysis ([place]) 12/10/2015    Orientation RESPIRATION BLADDER Height & Weight     Self, Situation, Place  Normal Continent Weight: 164 lb 0.4 oz (74.4 kg) Height:  5\' 7"  (170.2 cm)  BEHAVIORAL SYMPTOMS/MOOD NEUROLOGICAL BOWEL NUTRITION STATUS  Other (Comment) (Forgetful)   Continent Diet  AMBULATORY STATUS COMMUNICATION OF NEEDS Skin   Limited Assist (One assist with walker)  Verbally Skin abrasions                       Personal Care Assistance Level of Assistance  Bathing, Feeding, Dressing Bathing Assistance: Limited assistance Feeding assistance: Limited assistance Dressing Assistance: Limited assistance     Functional Limitations Info  Sight, Hearing, Speech Sight Info: Impaired Hearing Info: Adequate Speech Info: Adequate    SPECIAL CARE FACTORS FREQUENCY  PT (By licensed PT)     PT Frequency: 5x/wk              Contractures Contractures Info: Not present    Additional Factors Info  Code Status, Allergies, Psychotropic, Insulin Sliding Scale Code Status Info: Full Code Allergies Info: NKDA Psychotropic Info: Ambien 5 mg daiy at bedtime, Lexapro 10 mg daily Insulin Sliding Scale Info: Novolog 0-6 units three time a day with meals.       Current Medications (03/19/2021):  This is the current hospital active medication list Current Facility-Administered Medications  Medication Dose Route Frequency Provider Last Rate Last Admin   acetaminophen (TYLENOL) tablet 650 mg  650 mg Oral Q6H PRN Karmen Bongo, MD   650 mg at 03/18/21 2050   Or   acetaminophen (TYLENOL) suppository 650 mg  650 mg Rectal Q6H PRN Karmen Bongo, MD       amLODipine (NORVASC) tablet 10 mg  10 mg Oral Daily Ernest Haber, PA-C   10 mg at 03/19/21 1024   asenapine (SAPHRIS) sublingual tablet 5 mg  5 mg Sublingual Q12H PRN Akintayo, Mojeed, MD       atorvastatin (LIPITOR) tablet 10 mg  10 mg Oral QPM Karmen Bongo, MD   10 mg at 03/18/21 1805   calcitRIOL (ROCALTROL) capsule 1.5 mcg  1.5 mcg Oral Q M,W,F-HD Roney Jaffe, MD   1.5 mcg at 03/18/21 1805   calcium carbonate (dosed in mg elemental calcium) suspension 500 mg of elemental calcium  500 mg of elemental calcium Oral Q6H PRN Karmen Bongo, MD   500 mg of elemental calcium at 03/18/21 1843   camphor-menthol (SARNA) lotion 1 application  1 application Topical P8K PRN Karmen Bongo, MD       And    hydrOXYzine (ATARAX) tablet 25 mg  25 mg Oral Q8H PRN Karmen Bongo, MD       Chlorhexidine Gluconate Cloth 2 % PADS 6 each  6 each Topical Q0600 Karmen Bongo, MD   6 each at 03/19/21 0704   docusate sodium (COLACE) capsule 100 mg  100 mg Oral BID Karmen Bongo, MD   100 mg at 03/19/21 1024   docusate sodium (ENEMEEZ) enema 283 mg  1 enema Rectal PRN Karmen Bongo, MD       escitalopram (LEXAPRO) tablet 10 mg  10 mg Oral Daily Kc, Ramesh, MD   10 mg at 03/19/21 1025   feeding supplement (NEPRO CARB STEADY) liquid 237 mL  237 mL Oral TID PRN Karmen Bongo, MD       ferric citrate (AURYXIA) tablet 420 mg  420 mg Oral TID WC Kc, Maren Beach, MD   420 mg at 03/19/21 1153   Gerhardt's butt cream   Topical TID PRN Antonieta Pert, MD   Given at 03/17/21 1804   heparin injection 5,000 Units  5,000 Units Subcutaneous Lynne Logan, MD   5,000 Units at 03/19/21 0701   hydrALAZINE (APRESOLINE) injection 5 mg  5 mg Intravenous Q4H PRN Karmen Bongo, MD   5 mg at 03/15/21 1849   hydrALAZINE (APRESOLINE) tablet 25 mg  25 mg Oral Lynne Logan, MD   25 mg at 03/19/21 9983   insulin aspart (novoLOG) injection 0-6 Units  0-6 Units Subcutaneous TID WC Karmen Bongo, MD   1 Units at 03/18/21 1808   labetalol (NORMODYNE) tablet 200 mg  200 mg Oral BID Karmen Bongo, MD   200 mg at 03/19/21 1025   lidocaine (LIDODERM) 5 % 1 patch  1 patch Transdermal Q24H Kc, Maren Beach, MD   1 patch at 03/18/21 1809   lidocaine-prilocaine (EMLA) cream 1 application  1 application Topical Continuous PRN Karmen Bongo, MD       loperamide (IMODIUM) capsule 2 mg  2 mg Oral PRN Kc, Maren Beach, MD       losartan (COZAAR) tablet 25 mg  25 mg Oral Daily Reesa Chew, MD   25 mg at 03/19/21 1025   multivitamin (RENA-VIT) tablet 1 tablet  1 tablet Oral Ivery Quale, MD   1 tablet at 03/18/21 2050   ondansetron (ZOFRAN) tablet 4 mg  4 mg Oral Q6H PRN Karmen Bongo, MD       Or   ondansetron [facility]) injection 4 mg   4 mg Intravenous Q6H PRN Karmen Bongo, MD   4 mg at 03/17/21 2206   oxyCODONE (Oxy IR/ROXICODONE) immediate release tablet 5 mg  5 mg Oral Q4H PRN Karmen Bongo, MD       sodium chloride flush (NS) 0.9 % injection 3 mL  3 mL Intravenous Q12H Karmen Bongo, MD   3 mL at 03/19/21 1027   sorbitol 70 % solution 30 mL  30 mL  Oral PRN Karmen Bongo, MD       zolpidem Lorrin Mais) tablet 5 mg  5 mg Oral QHS PRN Karmen Bongo, MD   5 mg at 03/18/21 2050     Discharge Medications: Please see discharge summary for a list of discharge medications.  Relevant Imaging Results:  Relevant Lab Results:   Additional Information SSN#- [id number], Hemodialysis at [facility] on MWF schedule.  Paulene Floor Geraldean Walen, LCSWA

## 2021-03-19 NOTE — Progress Notes (Signed)
[facility] Progress Note   Subjective:  Seen in room. Completed dialysis yesterday net UF 2.2L. No issues.  Seems a little more oriented today.   Objective Vitals:   03/18/21 2054 03/18/21 2136 03/19/21 0633 03/19/21 0903  BP: (!) 154/95 (!) 147/91 (!) 156/80 135/70  Pulse: 73 (!) 44 66 67  Resp: 18 18 18 19   Temp: 98.9 F (37.2 C) 98.7 F (37.1 C) 98.1 F (36.7 C) 98.7 F (37.1 C)  TempSrc: Oral Oral Oral Oral  SpO2: 96% 97% 98% 97%  Weight:   74.4 kg   Height:         Additional Objective Labs: Basic Metabolic Panel: Recent Labs  Lab 03/18/21 0621 03/18/21 1020 03/19/21 0324  NA 133* 134* 135  K 4.3 4.4 4.1  CL 96* 94* 95*  CO2 21* 22 26  GLUCOSE 92 95 108*  BUN 56* 58* 31*  CREATININE 11.26* 11.58* 7.46*  CALCIUM 9.3 8.8* 8.7*  PHOS  --  7.1*  --     CBC: Recent Labs  Lab 03/15/21 0500 03/15/21 0514 03/16/21 0642 03/18/21 1020  WBC 9.6  --  8.0 6.8  NEUTROABS 7.9*  --   --   --   HGB 13.0 15.0 11.9* 11.4*  HCT 40.0 44.0 37.2 34.2*  MCV 91.7  --  90.1 89.1  PLT 293  --  270 269    Blood Culture    Component Value Date/Time   SDES BLOOD RIGHT HAND 03/16/2021 0642   SPECREQUEST  03/16/2021 0642    BOTTLES DRAWN AEROBIC ONLY Blood Culture adequate volume   CULT  03/16/2021 0642    NO GROWTH 3 DAYS Performed at [facility], [place] [address], [place], [place] [address]    REPTSTATUS PENDING 03/16/2021 7062     Physical Exam General: Sitting up in bed, nad  Heart: RRR No m,r,g Lungs: Clear bilaterally  Abdomen: soft non-tender  Extremities: No LE edema  Dialysis Access: LUE AVF +bruit   Medications:  cefTRIAXone (ROCEPHIN)  IV 2 g (03/19/21 1027)   lidocaine-prilocaine      amLODipine  10 mg Oral Daily   atorvastatin  10 mg Oral QPM   calcitRIOL  1.5 mcg Oral Q M,W,F-HD   Chlorhexidine Gluconate Cloth  6 each Topical Q0600   docusate sodium  100 mg Oral BID   escitalopram  10 mg Oral Daily   ferric citrate   420 mg Oral TID WC   heparin  5,000 Units Subcutaneous Q8H   hydrALAZINE  25 mg Oral Q8H   insulin aspart  0-6 Units Subcutaneous TID WC   labetalol  200 mg Oral BID   lidocaine  1 patch Transdermal Q24H   losartan  25 mg Oral Daily   multivitamin  1 tablet Oral QHS   sodium chloride flush  3 mL Intravenous Q12H    Dialysis Orders:  MWF [facility]  3h  75kg  2/2 bath  LUA AVG  Hep 2000   - calcitriol 1.5 ug tiw   - mircera 200 q2, last 12/28  Assessment/Plan: AMS -multifactorial. Missed HD PTA and ESRD pt recently prescribed Valtrex for shingles (son unaware if renally dosed) and gabapentin(also not sure of dosing) = COMMON  Cause of AMS when given to ESRD pt's at the non-ESRD dose.  Has improved some since dialysis,  Valtrex takes at least 2-3 days to wash out  sometimes longer.  Neurontin effects are not as long. Also meds plus infectious process  from pneumonia in ESRD patient was seen by psych for agitation meds.  Possible PNA - getting IV abx for suspected CAP, per pmd.  ESRD - on HD MWF.  Back on schedule. Next HD 1/18.   BP/ volume - BP better. Back on amlodipine, labetalol, hydralazine.  Not vol overloaded on exam. CXR w/ LLL changes, scarring upper lobes.  Did not tolerate large UF goal. UF as able.    Anemia ckd - Hb 11.4 no esa needs MBD ckd - cont binder and vdra w/ HD Dispo - PT/OT recommending SNF placement    [place] PA-C [facility] 03/19/2021,10:43 AM

## 2021-03-19 NOTE — TOC Progression Note (Signed)
Transition of Care [facility]) - Initial/Assessment Note    Patient Details  Name: Bianca Myers MRN: [id number] Date of Birth: 07-03-1945  Transition of [facility]) CM/SW Contact:    Milinda Antis, [place] Phone Number: 03/19/2021, 1:13 PM  Clinical Narrative:                 RE: Bianca Myers Date of Birth:  03-27-1945 Date: 03/19/2021  Please be advised that the above-named patient will require a short-term nursing home stay - anticipated 30 days or less for rehabilitation and strengthening.  The plan is for return home.         Patient Goals and CMS Choice        Expected Discharge Plan and Services                                                Prior Living Arrangements/Services                       Activities of Daily Living Home Assistive Devices/Equipment: Gilford Rile (specify type) ADL Screening (condition at time of admission) Patient's cognitive ability adequate to safely complete daily activities?: No Is the patient deaf or have difficulty hearing?: No Does the patient have difficulty seeing, even when wearing glasses/contacts?: Yes Does the patient have difficulty concentrating, remembering, or making decisions?: Yes Patient able to express need for assistance with ADLs?: No Does the patient have difficulty dressing or bathing?: Yes Independently performs ADLs?: No Communication: Needs assistance Is this a change from baseline?: Pre-admission baseline Dressing (OT): Dependent Is this a change from baseline?: Pre-admission baseline Grooming: Dependent Is this a change from baseline?: Pre-admission baseline Bathing: Dependent Is this a change from baseline?: Pre-admission baseline Toileting: Dependent Is this a change from baseline?: Pre-admission baseline In/Out Bed: Dependent Is this a change from baseline?: Pre-admission baseline Walks in Home: Dependent Is this a change from baseline?: Pre-admission baseline Does the patient  have difficulty walking or climbing stairs?: Yes Weakness of Legs: Both Weakness of Arms/Hands: Both  Permission Sought/Granted                  Emotional Assessment              Admission diagnosis:  Pneumonia [J18.9] Acute encephalopathy [G93.40] Patient Active Problem List   Diagnosis Date Noted   Stool incontinence 03/18/2021   Psychomotor agitation 03/16/2021   Pneumonia 27/08/2374   Acute metabolic encephalopathy 28/31/5176   Dyslipidemia 03/15/2021   Fall 12/19/2019   Multiple rib fractures 12/19/2019   Hyperkalemia 12/19/2019   Thoracic ascending aortic aneurysm 12/19/2019   Left displaced femoral neck fracture ([place])    Syncope 06/07/2019   MSSA bacteremia 03/10/2019   Diabetes mellitus ([place]) 03/10/2019   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   Mild aortic stenosis 03/10/2019   HCAP (healthcare-associated pneumonia) 03/09/2019   Heart failure ([place]) 08/31/2018   Anemia of chronic kidney failure, stage 5 ([place]) 08/27/2018   Chronic depression 08/27/2018   Patient is Jehovah's Witness 08/27/2018   Essential hypertension    End-stage renal disease on hemodialysis ([place]) 12/10/2015   PCP:  [name], MD Pharmacy:   [facility] [phone], [place] [address] [place] [address] Phone: [phone] Fax: [phone]  [facility] [place], [place] [address] Phone: [phone] Fax: [phone]     Social Determinants of Health (SDOH) Interventions    Readmission Risk Interventions No flowsheet data found.

## 2021-03-19 NOTE — Progress Notes (Addendum)
Physical Therapy Treatment Patient Details Name: Bianca Myers MRN: [id number] DOB: 05-Nov-1945 Today's Date: 03/19/2021   History of Present Illness Pt is a 76 y.o. F who presents 03/15/2021 with AMS and cough shortly after she was put on a medication for shingles. Significant PMH: anemia, ESRD on HD, gout, HTN.    PT Comments    Pt progressing well towards her physical therapy goals, demonstrating improved cognition and mobility. Pt A&Ox3, did require re-orientation to why she is in the hospital. No jerking or tremors noted this session. Pt requiring min assist for transfers and ambulating room distances with a walker at a min guard assist level. Further distance deferred as pt reporting lightheadedness; resolved with seated rest break. Suspect continued progress. Updated d/c plan in light of above; discussed with pt son who said he can provide assist at home. Notified CM, CSW, MD.    Recommendations for follow up therapy are one component of a multi-disciplinary discharge planning process, led by the attending physician.  Recommendations may be updated based on patient status, additional functional criteria and insurance authorization.  Follow Up Recommendations  Outpatient PT (pt declining HHPT)     Assistance Recommended at Discharge PRN  Patient can return home with the following A little help with walking and/or transfers;A little help with bathing/dressing/bathroom;Assistance with cooking/housework   Equipment Recommendations  None recommended by PT    Recommendations for Other Services       Precautions / Restrictions Precautions Precautions: Fall Restrictions Weight Bearing Restrictions: No     Mobility  Bed Mobility Overal bed mobility: Needs Assistance Bed Mobility: Supine to Sit Rolling: Supervision              Transfers Overall transfer level: Needs assistance Equipment used: Rolling walker (2 wheels) Transfers: Sit to/from Stand Sit to Stand: Min  assist           General transfer comment: MinA to rise to standing from edge of bed and toilet    Ambulation/Gait Ambulation/Gait assistance: Min guard Gait Distance (Feet): 30 Feet Assistive device: Rolling walker (2 wheels) Gait Pattern/deviations: Step-through pattern, Decreased stride length       General Gait Details: overall slow and steady pace, min guard for safety   Stairs             Wheelchair Mobility    Modified Rankin (Stroke Patients Only)       Balance Overall balance assessment: Needs assistance Sitting-balance support: Feet supported Sitting balance-Leahy Scale: Good     Standing balance support: Bilateral upper extremity supported, During functional activity Standing balance-Leahy Scale: Fair                              Cognition Arousal/Alertness: Awake/alert Behavior During Therapy: WFL for tasks assessed/performed Overall Cognitive Status: Impaired/Different from baseline Area of Impairment: Orientation, Safety/judgement, Awareness                 Orientation Level: Disoriented to, Situation       Safety/Judgement: Decreased awareness of safety, Decreased awareness of deficits Awareness: Emergent   General Comments: Pt now A&Ox3, not oriented to situation. needs cues for safety awareness i.e. pt asking if she could get out of bed by herself        Exercises      General Comments        Pertinent Vitals/Pain Pain Assessment Pain Assessment: Faces Faces Pain Scale: No hurt    Home  Living                          Prior Function            PT Goals (current goals can now be found in the care plan section) Acute Rehab PT Goals Patient Stated Goal: get out of bed Potential to Achieve Goals: Good Progress towards PT goals: Progressing toward goals    Frequency    Min 3X/week      PT Plan Discharge plan needs to be updated    Co-evaluation              AM-PAC PT "6  Clicks" Mobility   Outcome Measure  Help needed turning from your back to your side while in a flat bed without using bedrails?: None Help needed moving from lying on your back to sitting on the side of a flat bed without using bedrails?: A Little Help needed moving to and from a bed to a chair (including a wheelchair)?: A Little Help needed standing up from a chair using your arms (e.g., wheelchair or bedside chair)?: A Little Help needed to walk in hospital room?: A Little Help needed climbing 3-5 steps with a railing? : A Lot 6 Click Score: 18    End of Session Equipment Utilized During Treatment: Gait belt Activity Tolerance: Patient tolerated treatment well Patient left: in chair;with call bell/phone within reach;with chair alarm set Nurse Communication: Mobility status PT Visit Diagnosis: Unsteadiness on feet (R26.81);Other abnormalities of gait and mobility (R26.89);Difficulty in walking, not elsewhere classified (R26.2)     Time: 8110-3159 PT Time Calculation (min) (ACUTE ONLY): 25 min  Charges:  $Therapeutic Activity: 23-37 mins                     Wyona Almas, PT, DPT Acute Rehabilitation Services Pager [phone] Office [phone]    Deno Etienne 03/19/2021, 4:06 PM

## 2021-03-19 NOTE — Evaluation (Signed)
Clinical/Bedside Swallow Evaluation Patient Details  Name: Reesa Gotschall MRN: [id number] Date of Birth: [birth year]  Today's Date: 03/19/2021 Time: SLP Start Time (ACUTE ONLY): 0815 SLP Stop Time (ACUTE ONLY): 7425 SLP Time Calculation (min) (ACUTE ONLY): 22 min  Past Medical History:  Past Medical History:  Diagnosis Date   Anemia    Arthritis    Depression    Diabetes mellitus ([place])    Dyslipidemia 03/15/2021   ESRD (end stage renal disease) on dialysis [facility])    Dialysis M/W/F   Gout    History of anemia due to chronic kidney disease    History of degenerative disc disease    Hypertension    Pneumonia    Refusal of blood transfusions as patient is Jehovah's Witness    Secondary hyperparathyroidism ([place])    Spondylisthesis    Past Surgical History:  Past Surgical History:  Procedure Laterality Date   A/V FISTULAGRAM Left 12/16/2018   Procedure: A/V FISTULAGRAM;  Surgeon: Marty Heck, MD;  Location: [place] [facility];  Service: Cardiovascular;  Laterality: Left;   A/V FISTULAGRAM Left 06/13/2019   Procedure: A/V FISTULAGRAM;  Surgeon: Marty Heck, MD;  Location: [place] [facility];  Service: Cardiovascular;  Laterality: Left;   AV FISTULA PLACEMENT Left 08/24/2018   Procedure: ARTERIOVENOUS (AV) FISTULA CREATION;  Surgeon: Waynetta Sandy, MD;  Location: [place];  Service: Vascular;  Laterality: Left;   AV FISTULA PLACEMENT Left 07/12/2019   Procedure: INSERTION OF LEFT UPPER ARM  ARTERIOVENOUS (AV) GORE-TEX GRAFT;  Surgeon: Elam Dutch, MD;  Location: [place];  Service: Vascular;  Laterality: Left;   [place] Left 11/04/2018   Procedure: [place];  Surgeon: Waynetta Sandy, MD;  Location: [place];  Service: Vascular;  Laterality: Left;   COLONOSCOPY     HIP SURGERY     INSERTION OF DIALYSIS CATHETER N/A 06/15/2019   Procedure: INSERTION OF TUNNELED  DIALYSIS CATHETER RIGHT INTERNAL  JUGULAR;  Surgeon: Serafina Mitchell, MD;  Location: [facility];  Service: Vascular;  Laterality: N/A;   IR FLUORO GUIDE CV LINE RIGHT  08/21/2018   IR FLUORO GUIDE CV LINE RIGHT  06/08/2019   IR PERC TUN PERIT CATH WO PORT S&I /IMAG  12/19/2019   IR REMOVAL TUN CV CATH W/O FL  03/11/2019   IR THROMBECTOMY AV FISTULA W/THROMBOLYSIS/PTA INC/SHUNT/IMG LEFT Left 12/21/2019   IR US GUIDE VASC ACCESS LEFT  12/21/2019   IR US GUIDE VASC ACCESS RIGHT  08/21/2018   IR US GUIDE VASC ACCESS RIGHT  06/08/2019   IR US GUIDE VASC ACCESS RIGHT  12/19/2019   PERIPHERAL VASCULAR BALLOON ANGIOPLASTY Left 06/13/2019   Procedure: PERIPHERAL VASCULAR BALLOON ANGIOPLASTY;  Surgeon: Marty Heck, MD;  Location: [place] [facility];  Service: Cardiovascular;  Laterality: Left;  AVF   PERIPHERAL VASCULAR INTERVENTION Left 12/16/2018   Procedure: PERIPHERAL VASCULAR INTERVENTION;  Surgeon: Marty Heck, MD;  Location: [place] [facility];  Service: Cardiovascular;  Laterality: Left;  arm fistula   TOTAL HIP ARTHROPLASTY Left 06/10/2019   Procedure: TOTAL HIP ARTHROPLASTY ANTERIOR APPROACH;  Surgeon: Leandrew Koyanagi, MD;  Location: [place];  Service: Orthopedics;  Laterality: Left;   HPI:  Pt is a 76 y.o. F who presents 03/15/2021 with AMS and cough shortly after she was put on a medication for shingles. Dx acute metabolic encephalopathy, pna.  Significant PMH: anemia, ESRD on HD, gout, HTN. CT cervical spoine 06/2019 Disc degeneration and  spurring C4-5 and C5-6; spurring on left C6-7. Pt underwent bse 2 days prior and eval was reordered.  Pt admits to some issues with swallowing and self feeding since admit, complaining of oral transiting issues.  Denies issues PTA.    Assessment / Plan / Recommendation  Clinical Impression  Pt continues with functional oropharyngeal swallow - appearing improved since prior eval 3 days ago.  Mechanics of her swallow look fine with adequate mastication, the appearance of a brisk swallow, and no  s/s of aspiraton.  Due to her fluid restriction, SLP did not conduct 3 ounce [place] water screen.  Pt able to feed self and reports improved swallow function *moving food from mouth into throat* since admit.  She denies swallowing ability being "normal" yet.  Admits to not restricting fluid to extent she should prior to admit - ? if this along with dry environment, could contribute to xerostomia.  Using teach back, advised pt to start meals with liquids, reinforced importance of oral care/dental brushing BID.  Pt today able to feed herself without limitations or overt appearance of limb apraxia.  Pt desired HOB reclined immediately after intake - delayed cough noted x1 - advised she try to remain upright for a while after eating.   Recommend regular solids and thin liquids.  No SLP f/u is needed. SLP Visit Diagnosis: Dysphagia, unspecified (R13.10)    Aspiration Risk  No limitations    Diet Recommendation Regular;Thin liquid   Liquid Administration via: Cup;Straw Medication Administration: Whole meds with liquid Supervision: Patient able to self feed Compensations: Slow rate;Small sips/bites Postural Changes: Seated upright at 90 degrees;Remain upright for at least 30 minutes after po intake    Other  Recommendations Oral Care Recommendations: Oral care BID    Recommendations for follow up therapy are one component of a multi-disciplinary discharge planning process, led by the attending physician.  Recommendations may be updated based on patient status, additional functional criteria and insurance authorization.  Follow up Recommendations No SLP follow up      Assistance Recommended at Discharge None  Functional Status Assessment  N/A  Frequency and Duration     N/A       Prognosis    N/A    Swallow Study   General HPI: Pt is a 76 y.o. F who presents 03/15/2021 with AMS and cough shortly after she was put on a medication for shingles. Dx acute metabolic encephalopathy, pna.   Significant PMH: anemia, ESRD on HD, gout, HTN. CT cervical spoine 06/2019 Disc degeneration and spurring C4-5 and C5-6; spurring on left C6-7. Pt underwent bse 2 days prior and eval was reordered.  Pt admits to some issues with swallowing and self feeding since admit, complaining of oral transiting issues.  Denies issues PTA. Type of Study: Bedside Swallow Evaluation Previous Swallow Assessment: yes, 3 days prior Diet Prior to this Study: Regular;Thin liquids Temperature Spikes Noted: No Respiratory Status: Room air History of Recent Intubation: No Behavior/Cognition: Alert;Cooperative Oral Cavity Assessment: Within Functional Limits Oral Care Completed by SLP: Yes (after po, slp assisted pt with set up for dental brushing) Oral Cavity - Dentition: Adequate natural dentition Vision: Functional for self-feeding Self-Feeding Abilities: Able to feed self Patient Positioning: Upright in bed Baseline Vocal Quality: Normal Volitional Cough: Strong Volitional Swallow: Able to elicit    Oral/Motor/Sensory Function Overall Oral Motor/Sensory Function: Within functional limits   Ice Chips Ice chips: Not tested   Thin Liquid Thin Liquid: Within functional limits Presentation: Self Fed;Straw  Nectar Thick Nectar Thick Liquid: Not tested   Honey Thick Honey Thick Liquid: Not tested   Puree Puree: Within functional limits Presentation: Self Fed;Spoon   Solid     Solid: Within functional limits Presentation: Self Fredirick Lathe 03/19/2021,8:46 AM   Kathleen Lime, [place] Office [phone] Cell [phone]

## 2021-03-20 LAB — CBC
HCT: 37 % (ref 36.0–46.0)
Hemoglobin: 11.7 g/dL — ABNORMAL LOW (ref 12.0–15.0)
MCH: 28.9 pg (ref 26.0–34.0)
MCHC: 31.6 g/dL (ref 30.0–36.0)
MCV: 91.4 fL (ref 80.0–100.0)
Platelets: 280 10*3/uL (ref 150–400)
RBC: 4.05 MIL/uL (ref 3.87–5.11)
RDW: 17.9 % — ABNORMAL HIGH (ref 11.5–15.5)
WBC: 6.2 10*3/uL (ref 4.0–10.5)
nRBC: 0 % (ref 0.0–0.2)

## 2021-03-20 LAB — GLUCOSE, CAPILLARY
Glucose-Capillary: 99 mg/dL (ref 70–99)
Glucose-Capillary: 95 mg/dL (ref 70–99)
Glucose-Capillary: 184 mg/dL — ABNORMAL HIGH (ref 70–99)
Glucose-Capillary: 108 mg/dL — ABNORMAL HIGH (ref 70–99)

## 2021-03-20 LAB — RENAL FUNCTION PANEL
Albumin: 3.3 g/dL — ABNORMAL LOW (ref 3.5–5.0)
Anion gap: 17 — ABNORMAL HIGH (ref 5–15)
BUN: 47 mg/dL — ABNORMAL HIGH (ref 8–23)
CO2: 23 mmol/L (ref 22–32)
Calcium: 9.1 mg/dL (ref 8.9–10.3)
Chloride: 92 mmol/L — ABNORMAL LOW (ref 98–111)
Creatinine, Ser: 9.97 mg/dL — ABNORMAL HIGH (ref 0.44–1.00)
GFR, Estimated: 4 mL/min — ABNORMAL LOW (ref >60)
Glucose, Bld: 118 mg/dL — ABNORMAL HIGH (ref 70–99)
Phosphorus: 6.7 mg/dL — ABNORMAL HIGH (ref 2.5–4.6)
Potassium: 3.8 mmol/L (ref 3.5–5.1)
Sodium: 132 mmol/L — ABNORMAL LOW (ref 135–145)

## 2021-03-20 LAB — CULTURE, BLOOD (ROUTINE X 2)
Culture: NO GROWTH
Special Requests: ADEQUATE

## 2021-03-20 MED ORDER — HEPARIN SODIUM (PORCINE) 1000 UNIT/ML DIALYSIS: 1000.0000 [IU] | INTRAMUSCULAR | Status: DC | PRN

## 2021-03-20 MED ORDER — HEPARIN SODIUM (PORCINE) 1000 UNIT/ML DIALYSIS: 20.0000 [IU]/kg | INTRAMUSCULAR | Status: DC | PRN

## 2021-03-20 MED ORDER — LIDOCAINE-PRILOCAINE 2.5-2.5 % EX CREA: 1.0000 "application " | TOPICAL_CREAM | CUTANEOUS | Status: DC | PRN

## 2021-03-20 MED ORDER — LOSARTAN POTASSIUM 50 MG PO TABS
50.0000 mg | ORAL_TABLET | Freq: Every day | ORAL | Status: DC
  Administered 2021-03-21 – 2021-03-23 (×2): 50 mg via ORAL
  Filled 2021-03-20 (×3): qty 1

## 2021-03-20 MED ORDER — ALTEPLASE 2 MG IJ SOLR: 2.0000 mg | Freq: Once | INTRAMUSCULAR | Status: DC | PRN

## 2021-03-20 MED ORDER — SODIUM CHLORIDE 0.9 % IV SOLN: 100.0000 mL | INTRAVENOUS | Status: DC | PRN

## 2021-03-20 MED ORDER — PENTAFLUOROPROP-TETRAFLUOROETH EX AERO: 1.0000 "application " | INHALATION_SPRAY | CUTANEOUS | Status: DC | PRN

## 2021-03-20 MED ORDER — LIDOCAINE HCL (PF) 1 % IJ SOLN: 5.0000 mL | INTRAMUSCULAR | Status: DC | PRN

## 2021-03-20 NOTE — Plan of Care (Signed)
°  Problem: Clinical Measurements: Goal: Ability to maintain a body temperature in the normal range will improve Outcome: Progressing   Problem: Education: Goal: Knowledge of General Education information will improve Description: Including pain rating scale, medication(s)/side effects and non-pharmacologic comfort measures Outcome: Progressing   Problem: Health Behavior/Discharge Planning: Goal: Ability to manage health-related needs will improve Outcome: Progressing

## 2021-03-20 NOTE — Care Management Important Message (Signed)
Important Message  Patient Details  Name: Bianca Myers MRN: [id number] Date of Birth: 1945/04/04   Medicare Important Message Given:  Yes     Bianca Myers Circle 03/20/2021, 1:37 PM

## 2021-03-20 NOTE — Plan of Care (Signed)
  Problem: Activity: Goal: Ability to tolerate increased activity will improve Outcome: Progressing   

## 2021-03-20 NOTE — Progress Notes (Addendum)
Occupational Therapy Treatment Patient Details Name: Bianca Myers MRN: [id number] DOB: 1946/01/27 Today's Date: 03/20/2021   History of present illness Pt is a 76 y.o. F who presents 03/15/2021 with AMS and cough shortly after she was put on a medication for shingles. Significant PMH: anemia, ESRD on HD, gout, HTN.   OT comments  Pt in recliner upo arrival leaned over to R side. Pt states that she wants to go back to bed ad does not want to eat her lunch. Pt reports feeling light headed, (BP 112/73 and O2 SATs 90% on RA) as pt did not have [place] on. Pt asked therapist to place [place] on her. Pt able to stand min A - min guard A with RW, wash hands standing and doff gown on her back while standing with min A. Pt required min A with LEs back onto bed and was able to scoot herself to [facility] using rails. Pt's son entered room at end of session. Notified RN of pt's status. OT will continue to follow acutely to maximize level of function and safety   Recommendations for follow up therapy are one component of a multi-disciplinary discharge planning process, led by the attending physician.  Recommendations may be updated based on patient status, additional functional criteria and insurance authorization.    Follow Up Recommendations  Home health OT (refusing [place])    Assistance Recommended at Discharge Intermittent Supervision/Assistance  Patient can return home with the following  A little help with walking and/or transfers;A little help with bathing/dressing/bathroom;Assistance with cooking/housework;Direct supervision/assist for medications management;Direct supervision/assist for financial management;Assist for transportation   Equipment Recommendations  BSC/3in1    Recommendations for Other Services      Precautions / Restrictions Precautions Precautions: Fall Restrictions Weight Bearing Restrictions: No       Mobility Bed Mobility Overal bed mobility: Needs Assistance         Sit to  supine: Min assist   General bed mobility comments: Pt in recliner upon arrival heavily leaning over to R side, min A with LES back onto bed    Transfers Overall transfer level: Needs assistance Equipment used: Rolling walker (2 wheels) Transfers: Sit to/from Stand Sit to Stand: Min assist           General transfer comment: min A to stand from recliner, min guard A from [facility]     Balance Overall balance assessment: Needs assistance Sitting-balance support: Feet supported Sitting balance-Leahy Scale: Fair     Standing balance support: Bilateral upper extremity supported, During functional activity Standing balance-Leahy Scale: Fair                             ADL either performed or assessed with clinical judgement   ADL Overall ADL's : Needs assistance/impaired     Grooming: Wash/dry hands;Min guard;Standing           Upper Body Dressing : Minimal assistance;Standing Upper Body Dressing Details (indicate cue type and reason): assist to doff gown on back standng at EOB     Toilet Transfer: Minimal assistance;Min guard;Ambulation;Rolling walker (2 wheels);BSC/3in1;Cueing for safety   Toileting- Clothing Manipulation and Hygiene: Minimal assistance Toileting - Clothing Manipulation Details (indicate cue type and reason): clothing mgt, did not have to have BM obce seated on toilet     Functional mobility during ADLs: Minimal assistance;Min guard;Cueing for safety;Rolling walker (2 wheels)      Extremity/Trunk Assessment Upper Extremity Assessment Upper Extremity Assessment: Overall [facility]  for tasks assessed   Lower Extremity Assessment Lower Extremity Assessment: Defer to PT evaluation        Vision Ability to See in Adequate Light: 0 Adequate Patient Visual Report: No change from baseline     Perception     Praxis      Cognition   Behavior During Therapy: [facility] for tasks assessed/performed Overall Cognitive Status: Impaired/Different from  baseline Area of Impairment: Safety/judgement                         Safety/Judgement: Decreased awareness of safety, Decreased awareness of deficits     General Comments: pt with impaired safety awareness asking if she can get OOB  to [facility] by herslef if she needs to use it        Exercises      Shoulder Instructions       General Comments      Pertinent Vitals/ Pain       Pain Assessment Pain Assessment: No/denies pain Faces Pain Scale: No hurt  Home Living                                          Prior Functioning/Environment              Frequency  Min 2X/week        Progress Toward Goals  OT Goals(current goals can now be found in the care plan section)  Progress towards OT goals: Progressing toward goals     Plan Discharge plan remains appropriate    Co-evaluation                 AM-PAC OT "6 Clicks" Daily Activity     Outcome Measure   Help from another person eating meals?: None Help from another person taking care of personal grooming?: A Little Help from another person toileting, which includes using toliet, bedpan, or urinal?: A Little Help from another person bathing (including washing, rinsing, drying)?: A Little Help from another person to put on and taking off regular upper body clothing?: A Little Help from another person to put on and taking off regular lower body clothing?: A Lot 6 Click Score: 18    End of Session Equipment Utilized During Treatment: Rolling walker (2 wheels);Other (comment) (BSC)  OT Visit Diagnosis: Unsteadiness on feet (R26.81)   Activity Tolerance Patient limited by fatigue;Other (comment) (c/o being light headed)   Patient Left in chair;with call bell/phone within reach;with chair alarm set;with family/visitor present   Nurse Communication Mobility status        Time: 7416-3845 OT Time Calculation (min): 14 min  Charges: OT General Charges $OT Visit: 1 Visit OT  Treatments $Therapeutic Activity: 8-22 mins   [name] 03/20/2021, 3:41 PM

## 2021-03-20 NOTE — Progress Notes (Signed)
Physical Therapy Treatment Patient Details Name: Bianca Myers MRN: [id number] DOB: 03/30/1945 Today's Date: 03/20/2021   History of Present Illness Pt is a 76 y.o. F who presents 03/15/2021 with AMS and cough shortly after she was put on a medication for shingles. Significant PMH: anemia, ESRD on HD, gout, HTN.    PT Comments    Pt limited in mobility this session due to reports of "lightheadedness." Pt able to sit up on the edge of bed and then don socks with min assist. Transferring over to recliner with walker with hands on assist for safety. BP 103/86 (93) pre mobility, 103/66 (78) post mobility, 73 HR, 96% SpO2 on RA. Pt deferring further ambulation at this time.   Recommendations for follow up therapy are one component of a multi-disciplinary discharge planning process, led by the attending physician.  Recommendations may be updated based on patient status, additional functional criteria and insurance authorization.  Follow Up Recommendations  Outpatient PT (pt declining HHPT)     Assistance Recommended at Discharge PRN  Patient can return home with the following A little help with walking and/or transfers;A little help with bathing/dressing/bathroom;Assistance with cooking/housework   Equipment Recommendations  None recommended by PT    Recommendations for Other Services       Precautions / Restrictions Precautions Precautions: Fall Restrictions Weight Bearing Restrictions: No     Mobility  Bed Mobility Overal bed mobility: Needs Assistance Bed Mobility: Supine to Sit, Sit to Supine     Supine to sit: Supervision Sit to supine: Supervision   General bed mobility comments: no physical assist required, increased time to progress to upright    Transfers Overall transfer level: Needs assistance Equipment used: Rolling walker (2 wheels) Transfers: Sit to/from Stand, Bed to chair/wheelchair/BSC Sit to Stand: Min assist   Step pivot transfers: Min guard        General transfer comment: Light minA to rise to standing from edge of bed. pivoting towards left to recline    Ambulation/Gait                   Stairs             Wheelchair Mobility    Modified Rankin (Stroke Patients Only)       Balance Overall balance assessment: Needs assistance Sitting-balance support: Feet supported Sitting balance-Leahy Scale: Good     Standing balance support: Bilateral upper extremity supported, During functional activity Standing balance-Leahy Scale: Fair                              Cognition Arousal/Alertness: Awake/alert Behavior During Therapy: WFL for tasks assessed/performed Overall Cognitive Status: Impaired/Different from baseline Area of Impairment: Safety/judgement, Awareness, Following commands                     Memory: Decreased short-term memory Following Commands: Follows one step commands with increased time Safety/Judgement: Decreased awareness of safety, Decreased awareness of deficits Awareness: Emergent   General Comments: Not oriented to day of week, impaired safety awareness        Exercises      General Comments        Pertinent Vitals/Pain Pain Assessment Pain Assessment: Faces Faces Pain Scale: No hurt    Home Living                          Prior Function  PT Goals (current goals can now be found in the care plan section) Acute Rehab PT Goals Patient Stated Goal: get out of bed Potential to Achieve Goals: Good    Frequency    Min 3X/week      PT Plan Discharge plan needs to be updated    Co-evaluation              AM-PAC PT "6 Clicks" Mobility   Outcome Measure  Help needed turning from your back to your side while in a flat bed without using bedrails?: None Help needed moving from lying on your back to sitting on the side of a flat bed without using bedrails?: A Little Help needed moving to and from a bed to a chair  (including a wheelchair)?: A Little Help needed standing up from a chair using your arms (e.g., wheelchair or bedside chair)?: A Little Help needed to walk in hospital room?: A Little Help needed climbing 3-5 steps with a railing? : A Lot 6 Click Score: 18    End of Session Equipment Utilized During Treatment: Gait belt Activity Tolerance: Patient tolerated treatment well Patient left: in chair;with call bell/phone within reach;with chair alarm set Nurse Communication: Mobility status PT Visit Diagnosis: Unsteadiness on feet (R26.81);Other abnormalities of gait and mobility (R26.89);Difficulty in walking, not elsewhere classified (R26.2)     Time: 2902-1115 PT Time Calculation (min) (ACUTE ONLY): 27 min  Charges:  $Therapeutic Activity: 23-37 mins                     Wyona Almas, PT, DPT Acute Rehabilitation Services Pager [phone] Office [phone]    Deno Etienne 03/20/2021, 5:01 PM

## 2021-03-20 NOTE — Discharge Summary (Signed)
PROGRESS NOTE    Bianca Myers  DJM:[id number] DOB: 1945-10-12 DOA: 03/15/2021 PCP: Bianca Berger, MD   Chief Complaint  Patient presents with   Altered Mental Status   Weakness  Brief Narrative/Hospital Course: Bianca Myers, 76 y.o. female with PMH of ESRD on HD MWF,DM,HTN, depression presenting with AMS. She was treated outpatient for pneumonia 10 days ago, continues to have cough and SOB, was seen again for left flank pain a couple days and started on gabapentin and valacyclovir for suspected zoster without rash, and has since developed worsening speech and cognitive disturbance. She was last dialyzed on Monday. ED workup notable for potassium 6.4, BUN 82, and suspected pneumonia. No rash in ED  Nephrology was consulted by ED and she was given vancomycin and Zosyn. Patient was admitted underwent hemodialysis Valtrex and gabapentin was discontinued.  She was placed on empiric antibiotics for pneumonia. Mental status significantly improved seen by psychiatrist no further recommendation, seen by PT OT and has advised skilled nursing facility initially subsequently home health PT but patient declining and planning for outpatient PT   Subjective:  Seen and examined this morning alert awake oriented not in distress resting comfortably nursing reports mushy stoolx1, yesterday BM X1 Complains of being weak Son concerned that patient having shortness of breath during dialysis AND SHE IS TOOK WEAK TO Bianca Myers.  Assessment & Plan: Acute metabolic encephalopathy QIW:[id number] in the setting of missed dialysis and  2/2 gabapentin and valacyclovir-valacyclovir.Suspecting acute metabolic encephalopathy.  Mentation overall stable but appears weak and deconditioned.  CT head with mild cerebral atrophy with extensive chronic microvascular ischemic changes. Continue Lexapro. son informed to be careful about renal dose.    Bronchitis/developing pneumonia on chest x-ray 1/13 : Completed  antibiotics 03/19/21.    Lul density:CT chest showed chronic scarring in the bilateral upper lobes and superior segment of right lower lower lob.  No evidence of nodule/malignancy.  Acute posterior left eighth rib fracture: Patient has been complaining pain for several days as per the son possible fall at home as she has had a fall previously causing fractures in the past.  Cont  lidocaine patch, cannot appear controlled add incentive spirometry  ESRD on HD MWF: She is back on schedule HD.  Son concerned about shortness of breath during dialysis and was dizzy afterwards-does not tolerate large UF goals UF as able, lower EDW at discharge per nephro. On binders but some diarrhea/incontinence. Get CXR today.  Stool incontinence:GI panel negative, cont Imodium supportive care.  She is on binders.  Dyslipidemia: on Lipitor  Diabetes mellitus:1c stable 5.2, blood sugar  stable . Monitor Recent Labs  Lab 03/15/21 0500 03/15/21 1156 03/20/21 0641 03/20/21 1317 03/20/21 1654 03/20/21 2043 03/21/21 0655  GLUCAP  --    < > 99 95 184* 108* 96  HGBA1C 5.2  --   --   --   --   --   --    < > = values in this interval not displayed.     Essential hypertension: Has been poorly controlled -nephrology has increased amlodipine to 10 mg and added losartan to 50 mg, off hydralazine.  Watch for soft blood pressure given her dizziness.  Orthostatics were -1/18.  Monitor.   Anemia of chronic kidney disease-stable hemoglobin. Recent Labs  Lab 03/15/21 0500 03/15/21 0514 03/16/21 0642 03/18/21 1020 03/20/21 0830  HGB 13.0 15.0 11.9* 11.4* 11.7*  HCT 40.0 44.0 37.2 34.2* 37.0    Deconditioning/debility/ gen weakness:Physical deconditioning debility in the setting of  multiple complex comorbidities.  Patient complains of being lightheaded and dizzy seen by physical therapy and reevaluation may benefit with a skilled nursing facility-at this time patient wants to go with outpatient PT/OT declined  Hhpt.  DVT prophylaxis: heparin injection 5,000 Units Start: 03/15/21 1400 Code Status:   Code Status: Full Code Family Communication: plan of care discussed with patient at bedside. I called son and udpated today.  He wants to see how she does with dialysis.  He states patient should not be discharged on the day of dialysis.  He is upset that discharge is being mentioned multiple times. Status is: Inpatient Remains inpatient appropriate because: Due to ongoing management of debility deconditioning ESRD on HD having issues with shortness of breath.   Disposition: Currently is not medically stable for discharge. Anticipated Disposition: OPPT. Cont ptot. Fu cxr, HD in am to make sure pt abel to tolerate and was short of breath during HD and was dizzy afterwards.  Total time spent in the care of this patient  35 MINUTES Objective: Vitals last 24 hrs: Vitals:   03/20/21 1321 03/20/21 1648 03/20/21 2037 03/21/21 0432  BP: 111/71 118/76 115/69 140/77  Pulse: 69 (!) 58 74 66  Resp: 18 18 18 17   Temp: 98.4 F (36.9 C) 98.4 F (36.9 C) 99.5 F (37.5 C)   TempSrc: Oral Oral Oral   SpO2: 92% 100% 100% 95%  Weight:      Height:       Weight change: 0.1 kg  Intake/Output Summary (Last 24 hours) at 03/21/2021 1051 Last data filed at 03/21/2021 0800 Gross per 24 hour  Intake 920 ml  Output 1501 ml  Net -581 ml   Net IO Since Admission: -3,258.49 mL [03/21/21 1051]   Physical Examination: General exam: AAO, elderly, weak, frail. HEENT:Oral mucosa moist, Ear/Nose WNL grossly, dentition normal. Respiratory system: bilaterally clear, no use of accessory muscle Cardiovascular system: S1 & S2 +, No JVD,. Gastrointestinal system: Abdomen soft, NT,ND, BS+ Nervous System:Alert, awake, moving extremities and grossly nonfocal Extremities: no edema, distal peripheral pulses palpable.  Skin: No rashes,no icterus. MSK: Normal muscle bulk,tone, power   Medications reviewed: Scheduled Meds:   amLODipine  10 mg Oral Daily   atorvastatin  10 mg Oral QPM   calcitRIOL  1.5 mcg Oral Q M,W,F-HD   Chlorhexidine Gluconate Cloth  6 each Topical Q0600   docusate sodium  100 mg Oral BID   escitalopram  10 mg Oral Daily   ferric citrate  420 mg Oral TID WC   heparin  5,000 Units Subcutaneous Q8H   insulin aspart  0-6 Units Subcutaneous TID WC   labetalol  200 mg Oral BID   lidocaine  1 patch Transdermal Q24H   losartan  50 mg Oral Daily   multivitamin  1 tablet Oral QHS   sodium chloride flush  3 mL Intravenous Q12H   Continuous Infusions:  lidocaine-prilocaine     Diet Order             Diet renal with fluid restriction Fluid restriction: 1200 mL Fluid; Room service appropriate? Yes with Assist; Fluid consistency: Thin  Diet effective ____                 Weight change: 0.1 kg  Wt Readings from Last 3 Encounters:  03/20/21 72 kg  12/26/19 74.8 kg  07/28/19 77.1 kg     Consultants:see note  Procedures:see note Antimicrobials: Anti-infectives (From admission, onward)    Start  Dose/Rate Route Frequency Ordered Stop   03/16/21 1000  azithromycin (ZITHROMAX) tablet 500 mg        500 mg Oral Daily 03/16/21 0753 03/19/21 1024   03/15/21 1000  cefTRIAXone (ROCEPHIN) 2 g in sodium chloride 0.9 % 100 mL IVPB        2 g 200 mL/hr over 30 Minutes Intravenous Every 24 hours 03/15/21 0946 03/19/21 1057   03/15/21 1000  azithromycin (ZITHROMAX) 500 mg in sodium chloride 0.9 % 250 mL IVPB  Status:  Discontinued        500 mg 250 mL/hr over 60 Minutes Intravenous Every 24 hours 03/15/21 0946 03/16/21 0753   03/15/21 0630  vancomycin (VANCOREADY) IVPB 1500 mg/300 mL        1,500 mg 150 mL/hr over 120 Minutes Intravenous  Once 03/15/21 0621 03/15/21 1030   03/15/21 0630  piperacillin-tazobactam (ZOSYN) IVPB 3.375 g        3.375 g 100 mL/hr over 30 Minutes Intravenous  Once 03/15/21 0621 03/15/21 0817      Culture/Microbiology    Component Value Date/Time   SDES BLOOD  RIGHT HAND 03/16/2021 0642   SPECREQUEST  03/16/2021 0642    BOTTLES DRAWN AEROBIC ONLY Blood Culture adequate volume   CULT  03/16/2021 0642    NO GROWTH 5 DAYS Performed at [facility], [place] [address], [place], [place] [address]    REPTSTATUS 03/21/2021 FINAL 03/16/2021 1093    Other culture-see note  Unresulted Labs (From admission, onward)    None     Data Reviewed: I have personally reviewed following labs and imaging studies CBC: Recent Labs  Lab 03/15/21 0500 03/15/21 0514 03/16/21 0642 03/18/21 1020 03/20/21 0830  WBC 9.6  --  8.0 6.8 6.2  NEUTROABS 7.9*  --   --   --   --   HGB 13.0 15.0 11.9* 11.4* 11.7*  HCT 40.0 44.0 37.2 34.2* 37.0  MCV 91.7  --  90.1 89.1 91.4  PLT 293  --  270 269 235   Basic Metabolic Panel: Recent Labs  Lab 03/17/21 0730 03/18/21 0621 03/18/21 1020 03/19/21 0324 03/20/21 0830  NA 139 133* 134* 135 132*  K 4.6 4.3 4.4 4.1 3.8  CL 95* 96* 94* 95* 92*  CO2 26 21* 22 26 23   GLUCOSE 98 92 95 108* 118*  BUN 49* 56* 58* 31* 47*  CREATININE 9.63* 11.26* 11.58* 7.46* 9.97*  CALCIUM 8.7* 9.3 8.8* 8.7* 9.1  PHOS  --   --  7.1*  --  6.7*   GFR: Estimated Creatinine Clearance: 4.7 mL/min (A) (by C-G formula based on SCr of 9.97 mg/dL (H)). Liver Function Tests: Recent Labs  Lab 03/15/21 0500 03/18/21 1020 03/20/21 0830  AST 19  --   --   ALT 13  --   --   ALKPHOS 72  --   --   BILITOT 3.7*  --   --   PROT 7.8  --   --   ALBUMIN 3.9 3.2* 3.3*   No results for input(s): LIPASE, AMYLASE in the last 168 hours. Recent Labs  Lab 03/15/21 0500  AMMONIA 13   Coagulation Profile: No results for input(s): INR, PROTIME in the last 168 hours. Cardiac Enzymes: No results for input(s): CKTOTAL, CKMB, CKMBINDEX, TROPONINI in the last 168 hours. BNP (last 3 results) No results for input(s): PROBNP in the last 8760 hours. HbA1C: No results for input(s): HGBA1C in the last 72 hours.  CBG: Recent Labs  Lab 03/20/21 0641  03/20/21 1317 03/20/21 1654 03/20/21 2043 03/21/21 0655  GLUCAP 99 95 184* 108* 96   Lipid Profile: No results for input(s): CHOL, HDL, LDLCALC, TRIG, CHOLHDL, LDLDIRECT in the last 72 hours. Thyroid Function Tests: No results for input(s): TSH, T4TOTAL, FREET4, T3FREE, THYROIDAB in the last 72 hours. Anemia Panel: No results for input(s): VITAMINB12, FOLATE, FERRITIN, TIBC, IRON, RETICCTPCT in the last 72 hours. Sepsis Labs: Recent Labs  Lab 03/15/21 0500  LATICACIDVEN 1.4    Recent Results (from the past 240 hour(s))  Resp Panel by RT-PCR (Flu A&B, Covid) Nasopharyngeal Swab     Status: None   Collection Time: 03/15/21  4:52 AM   Specimen: Nasopharyngeal Swab; Nasopharyngeal(NP) swabs in vial transport medium  Result Value Ref Range Status   SARS Coronavirus 2 by RT PCR NEGATIVE NEGATIVE Final    Comment: (NOTE) SARS-CoV-2 target nucleic acids are NOT DETECTED.  The SARS-CoV-2 RNA is generally detectable in upper respiratory specimens during the acute phase of infection. The lowest concentration of SARS-CoV-2 viral copies this assay can detect is 138 copies/mL. A negative result does not preclude SARS-Cov-2 infection and should not be used as the sole basis for treatment or other patient management decisions. A negative result may occur with  improper specimen collection/handling, submission of specimen other than nasopharyngeal swab, presence of viral mutation(s) within the areas targeted by this assay, and inadequate number of viral copies(<138 copies/mL). A negative result must be combined with clinical observations, patient history, and epidemiological information. The expected result is Negative.  Fact Sheet for Patients:  EntrepreneurPulse.com.au  Fact Sheet for Healthcare Providers:  IncredibleEmployment.be  This test is no t yet approved or cleared by the [place] FDA and  has been authorized for detection and/or  diagnosis of SARS-CoV-2 by FDA under an Emergency Use Authorization (EUA). This EUA will remain  in effect (meaning this test can be used) for the duration of the COVID-19 declaration under Section 564(b)(1) of the Act, 21 U.S.C.section 360bbb-3(b)(1), unless the authorization is terminated  or revoked sooner.       Influenza A by PCR NEGATIVE NEGATIVE Final   Influenza B by PCR NEGATIVE NEGATIVE Final    Comment: (NOTE) The Xpert Xpress SARS-CoV-2/FLU/RSV plus assay is intended as an aid in the diagnosis of influenza from Nasopharyngeal swab specimens and should not be used as a sole basis for treatment. Nasal washings and aspirates are unacceptable for Xpert Xpress SARS-CoV-2/FLU/RSV testing.  Fact Sheet for Patients: EntrepreneurPulse.com.au  Fact Sheet for Healthcare Providers: IncredibleEmployment.be  This test is not yet approved or cleared by the [place] FDA and has been authorized for detection and/or diagnosis of SARS-CoV-2 by FDA under an Emergency Use Authorization (EUA). This EUA will remain in effect (meaning this test can be used) for the duration of the COVID-19 declaration under Section 564(b)(1) of the Act, 21 U.S.C. section 360bbb-3(b)(1), unless the authorization is terminated or revoked.  Performed at [facility], [place] [address] [place], [place] [address]   Blood culture (routine x 2)     Status: None   Collection Time: 03/15/21  6:24 AM   Specimen: BLOOD  Result Value Ref Range Status   Specimen Description BLOOD BLOOD RIGHT FOREARM  Final   Special Requests   Final    BOTTLES DRAWN AEROBIC AND ANAEROBIC Blood Culture adequate volume   Culture   Final    NO GROWTH 5 DAYS Performed at [facility], [place]  [address] [place], [place] [address]    Report Status 03/20/2021 FINAL  Final  MRSA Next Gen by PCR, Nasal     Status: None   Collection Time: 03/15/21  8:25 AM   Specimen: Nasal Mucosa; Nasal  Swab  Result Value Ref Range Status   MRSA by PCR Next Gen NOT DETECTED NOT DETECTED Final    Comment: (NOTE) The GeneXpert MRSA Assay (FDA approved for NASAL specimens only), is one component of a comprehensive MRSA colonization surveillance program. It is not intended to diagnose MRSA infection nor to guide or monitor treatment for MRSA infections. Test performance is not FDA approved in patients less than 69 years old. Performed at [facility], [place] [address], [place], [place] [address]   Blood culture (routine x 2)     Status: None   Collection Time: 03/16/21  6:42 AM   Specimen: BLOOD RIGHT HAND  Result Value Ref Range Status   Specimen Description BLOOD RIGHT HAND  Final   Special Requests   Final    BOTTLES DRAWN AEROBIC ONLY Blood Culture adequate volume   Culture   Final    NO GROWTH 5 DAYS Performed at [facility], [place] [address] [place], [place] [address]    Report Status 03/21/2021 FINAL  Final  Gastrointestinal Panel by PCR , Stool     Status: None   Collection Time: 03/17/21  2:46 PM   Specimen: Stool  Result Value Ref Range Status   Campylobacter species NOT DETECTED NOT DETECTED Final   Plesimonas shigelloides NOT DETECTED NOT DETECTED Final   Salmonella species NOT DETECTED NOT DETECTED Final   Yersinia enterocolitica NOT DETECTED NOT DETECTED Final   Vibrio species NOT DETECTED NOT DETECTED Final   Vibrio cholerae NOT DETECTED NOT DETECTED Final   Enteroaggregative E coli (EAEC) NOT DETECTED NOT DETECTED Final   Enteropathogenic E coli (EPEC) NOT DETECTED NOT DETECTED Final   Enterotoxigenic E coli (ETEC) NOT DETECTED NOT DETECTED Final   Shiga like toxin producing E coli (STEC) NOT DETECTED NOT DETECTED Final   Shigella/Enteroinvasive E coli (EIEC) NOT DETECTED NOT DETECTED Final   Cryptosporidium NOT DETECTED NOT DETECTED Final   Cyclospora cayetanensis NOT DETECTED NOT DETECTED Final   Entamoeba histolytica NOT DETECTED NOT DETECTED Final    Giardia lamblia NOT DETECTED NOT DETECTED Final   Adenovirus F40/41 NOT DETECTED NOT DETECTED Final   Astrovirus NOT DETECTED NOT DETECTED Final   Norovirus GI/GII NOT DETECTED NOT DETECTED Final   Rotavirus A NOT DETECTED NOT DETECTED Final   Sapovirus (I, II, IV, and V) NOT DETECTED NOT DETECTED Final    Comment: Performed at [facility], [address] [place], [place] [address]   Radiology Studies: No results found.   LOS: 6 days   Antonieta Pert, MD [facility]  03/21/2021, 10:51 AM

## 2021-03-20 NOTE — Progress Notes (Signed)
PROGRESS NOTE    Bianca Myers  HUD:[id number] DOB: April 05, 1945 DOA: 03/15/2021 PCP: [name], MD   Chief Complaint  Patient presents with   Altered Mental Status   Weakness  Brief Narrative/Hospital Course: Bianca Myers, 76 y.o. female with PMH of ESRD on HD MWF,DM,HTN, depression presenting with AMS. She was treated outpatient for pneumonia 10 days ago, continues to have cough and SOB, was seen again for left flank pain a couple days and started on gabapentin and valacyclovir for suspected zoster without rash, and has since developed worsening speech and cognitive disturbance. She was last dialyzed on Monday. ED workup notable for potassium 6.4, BUN 82, and suspected pneumonia. No rash in ED  Nephrology was consulted by ED and she was given vancomycin and Zosyn. Patient was admitted underwent hemodialysis Valtrex and gabapentin was discontinued.  She was placed on empiric antibiotics for pneumonia. Mental status significantly improved seen by psychiatrist no further recommendation, seen by PT OT and has advised skilled nursing facility due to deconditioning and debility.  Subjective: Seen/examined this morning.  Resting comfortably she has no new complaints Overnight no fever.  Labs with a stable potassium. Doing well on room air Incontinent episode decreased to 3x  Assessment & Plan: Acute metabolic encephalopathy CHY:IFOYDXAJOINOMV in the setting of missed dialysis and  2/2 gabapentin and valacyclovir-valacyclovir takes 2 to 3 days for washout.Suspecting acute metabolic encephalopathy.  Mentation overall stable he is alert awake oriented x3, continue RSD continue Lexapro.  Son informed to be careful about NEWmedication as it needs to be renally dosed in future  Pneumonia:Imaging- LLL bronchopneumonia,nodular density in the LUL.  Continue to complete antibiotics.  Afebrile.  Lul density:CT chest showed minimally displaced acute posterior left eighth rib fracture, chronic  scarring in the bilateral upper lobes and superior segment of right lower lower lob.  Continue pain control  Acute posterior left eighth rib fracture: Patient has been complaining pain for several days as per the son possible fall at home as she has had a fall previously causing fractures in the past.  Cont  lidocaine patch ,continue pain control.  ESRD:On HD MWF, hd done this am. She missed HD last Wednesday a week ago-which may have increased her risk of medication reaction, nephro following on board.  On binders but some diarrhea/incontinence.   Stool incontinence:Stool incontinence, GI panel negative, cont Imodium supportive care,  Dyslipidemia: cont Lipitor  Diabetes mellitus:1c stable 5.2, blood sugar well controlled on sliding scale insulin.  Monitor blood sugar.    Essential hypertension: Has been poorly controlled cont on increased dose of amlodipine,labetalol, started  on losartan 25 mg-increase to 50 mg,discontinue hydralazine. Monitor and adjust the dose.  Anemia of chronic kidney disease-stable hemoglobin.  Deconditioning/debility: Patient complains of being lightheaded and dizzy with nursing staff yesterday reluctant to go home.  We will ask PT to reevaluate she may need to go to skilled nursing facility instead of returning home.    DVT prophylaxis: heparin injection 5,000 Units Start: 03/15/21 1400 Code Status:   Code Status: Full Code Family Communication: plan of care discussed with patient at bedside. Status is: Inpatient Remains inpatient appropriate because: Due to ongoing management pneumonia, deconditioning and debility Disposition: Currently is not medically stable for discharge. Anticipated Disposition: HH vs SNF, PT re-eval given dizziness with activity.  Total time spent in the care of this patient  35 MINUTES Objective: Vitals last 24 hrs: Vitals:   03/20/21 1030 03/20/21 1100 03/20/21 1130 03/20/21 1140  BP: (!) 142/63 139/73  121/68 (!) 146/72  Pulse: 72  (!) 6 70 68  Resp: 15 (!) 25 (!) 24 18  Temp:    (!) 97.5 F (36.4 C)  TempSrc:    Temporal  SpO2:    98%  Weight:    72 kg  Height:       Weight change: -2.4 kg  Intake/Output Summary (Last 24 hours) at 03/20/2021 1305 Last data filed at 03/20/2021 1140 Gross per 24 hour  Intake 240 ml  Output 1500 ml  Net -1260 ml    Net IO Since Admission: -4,177.49 mL [03/20/21 1305]   Physical Examination: General exam: AAOx 3, weak, frail,older than stated age, weak appearing. HEENT:Oral mucosa moist, Ear/Nose WNL grossly, dentition normal. Respiratory system: bilaterally clear, no use of accessory muscle Cardiovascular system: S1 & S2 +, No JVD,. Gastrointestinal system: Abdomen soft,NT,ND, BS+ Nervous System:Alert, awake, moving extremities and grossly nonfocal Extremities: no edema, distal peripheral pulses palpable.  Skin: No rashes,no icterus. MSK: Normal muscle bulk,tone, power   Medications reviewed: Scheduled Meds:  amLODipine  10 mg Oral Daily   atorvastatin  10 mg Oral QPM   calcitRIOL  1.5 mcg Oral Q M,W,F-HD   Chlorhexidine Gluconate Cloth  6 each Topical Q0600   docusate sodium  100 mg Oral BID   escitalopram  10 mg Oral Daily   ferric citrate  420 mg Oral TID WC   heparin  5,000 Units Subcutaneous Q8H   insulin aspart  0-6 Units Subcutaneous TID WC   labetalol  200 mg Oral BID   lidocaine  1 patch Transdermal Q24H   losartan  50 mg Oral Daily   multivitamin  1 tablet Oral QHS   sodium chloride flush  3 mL Intravenous Q12H   Continuous Infusions:  lidocaine-prilocaine     Diet Order             Diet renal with fluid restriction Fluid restriction: 1200 mL Fluid; Room service appropriate? Yes with Assist; Fluid consistency: Thin  Diet effective ____                 Weight change: -2.4 kg  Wt Readings from Last 3 Encounters:  03/20/21 72 kg  12/26/19 74.8 kg  07/28/19 77.1 kg     Consultants:see note  Procedures:see  note Antimicrobials: Anti-infectives (From admission, onward)    Start     Dose/Rate Route Frequency Ordered Stop   03/16/21 1000  azithromycin (ZITHROMAX) tablet 500 mg        500 mg Oral Daily 03/16/21 0753 03/19/21 1024   03/15/21 1000  cefTRIAXone (ROCEPHIN) 2 g in sodium chloride 0.9 % 100 mL IVPB        2 g 200 mL/hr over 30 Minutes Intravenous Every 24 hours 03/15/21 0946 03/19/21 1057   03/15/21 1000  azithromycin (ZITHROMAX) 500 mg in sodium chloride 0.9 % 250 mL IVPB  Status:  Discontinued        500 mg 250 mL/hr over 60 Minutes Intravenous Every 24 hours 03/15/21 0946 03/16/21 0753   03/15/21 0630  vancomycin (VANCOREADY) IVPB 1500 mg/300 mL        1,500 mg 150 mL/hr over 120 Minutes Intravenous  Once 03/15/21 0621 03/15/21 1030   03/15/21 0630  piperacillin-tazobactam (ZOSYN) IVPB 3.375 g        3.375 g 100 mL/hr over 30 Minutes Intravenous  Once 03/15/21 1610 03/15/21 0817      Culture/Microbiology    Component Value Date/Time   SDES BLOOD RIGHT  HAND 03/16/2021 0642   SPECREQUEST  03/16/2021 0642    BOTTLES DRAWN AEROBIC ONLY Blood Culture adequate volume   CULT  03/16/2021 0642    NO GROWTH 4 DAYS Performed at [facility], [place] [address], [place],  [address]    REPTSTATUS PENDING 03/16/2021 7253    Other culture-see note  Unresulted Labs (From admission, onward)     Start     Ordered   03/15/21 0453  Urinalysis, Routine w reflex microscopic  Once,   STAT        03/15/21 0452   03/15/21 0453  Rapid urine drug screen (hospital performed)  ONCE - STAT,   STAT        03/15/21 0452          Data Reviewed: I have personally reviewed following labs and imaging studies CBC: Recent Labs  Lab 03/15/21 0500 03/15/21 0514 03/16/21 0642 03/18/21 1020 03/20/21 0830  WBC 9.6  --  8.0 6.8 6.2  NEUTROABS 7.9*  --   --   --   --   HGB 13.0 15.0 11.9* 11.4* 11.7*  HCT 40.0 44.0 37.2 34.2* 37.0  MCV 91.7  --  90.1 89.1 91.4  PLT 293  --  270 269  664    Basic Metabolic Panel: Recent Labs  Lab 03/17/21 0730 03/18/21 0621 03/18/21 1020 03/19/21 0324 03/20/21 0830  NA 139 133* 134* 135 132*  K 4.6 4.3 4.4 4.1 3.8  CL 95* 96* 94* 95* 92*  CO2 26 21* 22 26 23   GLUCOSE 98 92 95 108* 118*  BUN 49* 56* 58* 31* 47*  CREATININE 9.63* 11.26* 11.58* 7.46* 9.97*  CALCIUM 8.7* 9.3 8.8* 8.7* 9.1  PHOS  --   --  7.1*  --  6.7*    GFR: Estimated Creatinine Clearance: 4.7 mL/min (A) (by C-G formula based on SCr of 9.97 mg/dL (H)). Liver Function Tests: Recent Labs  Lab 03/15/21 0500 03/18/21 1020 03/20/21 0830  AST 19  --   --   ALT 13  --   --   ALKPHOS 72  --   --   BILITOT 3.7*  --   --   PROT 7.8  --   --   ALBUMIN 3.9 3.2* 3.3*    No results for input(s): LIPASE, AMYLASE in the last 168 hours. Recent Labs  Lab 03/15/21 0500  AMMONIA 13    Coagulation Profile: No results for input(s): INR, PROTIME in the last 168 hours. Cardiac Enzymes: No results for input(s): CKTOTAL, CKMB, CKMBINDEX, TROPONINI in the last 168 hours. BNP (last 3 results) No results for input(s): PROBNP in the last 8760 hours. HbA1C: No results for input(s): HGBA1C in the last 72 hours.  CBG: Recent Labs  Lab 03/19/21 0900 03/19/21 1129 03/19/21 1703 03/19/21 2042 03/20/21 0641  GLUCAP 131* 141* 98 127* 99    Lipid Profile: No results for input(s): CHOL, HDL, LDLCALC, TRIG, CHOLHDL, LDLDIRECT in the last 72 hours. Thyroid Function Tests: No results for input(s): TSH, T4TOTAL, FREET4, T3FREE, THYROIDAB in the last 72 hours. Anemia Panel: No results for input(s): VITAMINB12, FOLATE, FERRITIN, TIBC, IRON, RETICCTPCT in the last 72 hours. Sepsis Labs: Recent Labs  Lab 03/15/21 0500  LATICACIDVEN 1.4     Recent Results (from the past 240 hour(s))  Resp Panel by RT-PCR (Flu A&B, Covid) Nasopharyngeal Swab     Status: None   Collection Time: 03/15/21  4:52 AM   Specimen: Nasopharyngeal Swab; Nasopharyngeal(NP) swabs in  vial  transport medium  Result Value Ref Range Status   SARS Coronavirus 2 by RT PCR NEGATIVE NEGATIVE Final    Comment: (NOTE) SARS-CoV-2 target nucleic acids are NOT DETECTED.  The SARS-CoV-2 RNA is generally detectable in upper respiratory specimens during the acute phase of infection. The lowest concentration of SARS-CoV-2 viral copies this assay can detect is 138 copies/mL. A negative result does not preclude SARS-Cov-2 infection and should not be used as the sole basis for treatment or other patient management decisions. A negative result may occur with  improper specimen collection/handling, submission of specimen other than nasopharyngeal swab, presence of viral mutation(s) within the areas targeted by this assay, and inadequate number of viral copies(<138 copies/mL). A negative result must be combined with clinical observations, patient history, and epidemiological information. The expected result is Negative.  Fact Sheet for Patients:  EntrepreneurPulse.com.au  Fact Sheet for Healthcare Providers:  IncredibleEmployment.be  This test is no t yet approved or cleared by the [place] FDA and  has been authorized for detection and/or diagnosis of SARS-CoV-2 by FDA under an Emergency Use Authorization (EUA). This EUA will remain  in effect (meaning this test can be used) for the duration of the COVID-19 declaration under Section 564(b)(1) of the Act, 21 U.S.C.section 360bbb-3(b)(1), unless the authorization is terminated  or revoked sooner.       Influenza A by PCR NEGATIVE NEGATIVE Final   Influenza B by PCR NEGATIVE NEGATIVE Final    Comment: (NOTE) The Xpert Xpress SARS-CoV-2/FLU/RSV plus assay is intended as an aid in the diagnosis of influenza from Nasopharyngeal swab specimens and should not be used as a sole basis for treatment. Nasal washings and aspirates are unacceptable for Xpert Xpress SARS-CoV-2/FLU/RSV testing.  Fact  Sheet for Patients: EntrepreneurPulse.com.au  Fact Sheet for Healthcare Providers: IncredibleEmployment.be  This test is not yet approved or cleared by the [place] FDA and has been authorized for detection and/or diagnosis of SARS-CoV-2 by FDA under an Emergency Use Authorization (EUA). This EUA will remain in effect (meaning this test can be used) for the duration of the COVID-19 declaration under Section 564(b)(1) of the Act, 21 U.S.C. section 360bbb-3(b)(1), unless the authorization is terminated or revoked.  Performed at [facility], [place] [address], [place], [place] [address]   Blood culture (routine x 2)     Status: None   Collection Time: 03/15/21  6:24 AM   Specimen: BLOOD  Result Value Ref Range Status   Specimen Description BLOOD BLOOD RIGHT FOREARM  Final   Special Requests   Final    BOTTLES DRAWN AEROBIC AND ANAEROBIC Blood Culture adequate volume   Culture   Final    NO GROWTH 5 DAYS Performed at [facility], [place] [address] [place], [place] [address]    Report Status 03/20/2021 FINAL  Final  MRSA Next Gen by PCR, Nasal     Status: None   Collection Time: 03/15/21  8:25 AM   Specimen: Nasal Mucosa; Nasal Swab  Result Value Ref Range Status   MRSA by PCR Next Gen NOT DETECTED NOT DETECTED Final    Comment: (NOTE) The GeneXpert MRSA Assay (FDA approved for NASAL specimens only), is one component of a comprehensive MRSA colonization surveillance program. It is not intended to diagnose MRSA infection nor to guide or monitor treatment for MRSA infections. Test performance is not FDA approved in patients less than 48 years old. Performed at [facility], [place] [address]  [place], [place] [address]   Blood culture (routine x 2)     Status: None (Preliminary result)   Collection Time: 03/16/21  6:42 AM   Specimen: BLOOD RIGHT HAND  Result Value Ref Range Status   Specimen Description BLOOD RIGHT HAND   Final   Special Requests   Final    BOTTLES DRAWN AEROBIC ONLY Blood Culture adequate volume   Culture   Final    NO GROWTH 4 DAYS Performed at [facility], [place] [address], [place], [place] [address]    Report Status PENDING  Incomplete  Gastrointestinal Panel by PCR , Stool     Status: None   Collection Time: 03/17/21  2:46 PM   Specimen: Stool  Result Value Ref Range Status   Campylobacter species NOT DETECTED NOT DETECTED Final   Plesimonas shigelloides NOT DETECTED NOT DETECTED Final   Salmonella species NOT DETECTED NOT DETECTED Final   Yersinia enterocolitica NOT DETECTED NOT DETECTED Final   Vibrio species NOT DETECTED NOT DETECTED Final   Vibrio cholerae NOT DETECTED NOT DETECTED Final   Enteroaggregative E coli (EAEC) NOT DETECTED NOT DETECTED Final   Enteropathogenic E coli (EPEC) NOT DETECTED NOT DETECTED Final   Enterotoxigenic E coli (ETEC) NOT DETECTED NOT DETECTED Final   Shiga like toxin producing E coli (STEC) NOT DETECTED NOT DETECTED Final   Shigella/Enteroinvasive E coli (EIEC) NOT DETECTED NOT DETECTED Final   Cryptosporidium NOT DETECTED NOT DETECTED Final   Cyclospora cayetanensis NOT DETECTED NOT DETECTED Final   Entamoeba histolytica NOT DETECTED NOT DETECTED Final   Giardia lamblia NOT DETECTED NOT DETECTED Final   Adenovirus F40/41 NOT DETECTED NOT DETECTED Final   Astrovirus NOT DETECTED NOT DETECTED Final   Norovirus GI/GII NOT DETECTED NOT DETECTED Final   Rotavirus A NOT DETECTED NOT DETECTED Final   Sapovirus (I, II, IV, and V) NOT DETECTED NOT DETECTED Final    Comment: Performed at [facility], [address], [place], [place] [address]      Radiology Studies: No results found.   LOS: 5 days   Antonieta Pert, MD [facility]  03/20/2021, 1:05 PM

## 2021-03-20 NOTE — TOC Initial Note (Signed)
Transition of Care [facility]) - Initial/Assessment Note    Patient Details  Name: Bianca Myers MRN: [id number] Date of Birth: Sep 24, 1945  Transition of Care [facility]) CM/SW Contact:    Tom-Johnson, Renea Ee, RN Phone Number: 03/20/2021, 2:06 PM  Clinical Narrative:                 CM spoke with patient and son about needs for post hospital transition. Patient states her son, Brenton Grills lives with her. States Brenton Grills assists her with her needs. Has a rollator, cane, bedside commode and a walker at home.  PT recommended home health PT and patient declines stating she does not want strangers to her house. Requested for outpatient therapy as she recently used [facility] services. Order placed and CM called and spoke with Judson Roch [phone]) and she requested to fax order, demographics and PT notes. Patient gave CM permission to fax request and successful notice received . CM will continue to follow with needs.   Expected Discharge Plan: OP Rehab Barriers to Discharge: Continued Medical Work up   Patient Goals and CMS Choice Patient states their goals for this hospitalization and ongoing recovery are:: To return home CMS Medicare.gov Compare Post Acute Care list provided to:: Patient Choice offered to / list presented to : Patient, Adult Children (Son, Doristine Bosworth.)  Expected Discharge Plan and Services Expected Discharge Plan: OP Rehab   Discharge Planning Services: CM Consult Post Acute Care Choice:  (Outpatient PT.)                   DME Arranged: 3-N-1 DME Agency: [employer] Date DME Agency Contacted: 03/20/21 Time DME Agency Contacted: 86 Representative spoke with at DME Agency: Freda Munro HH Arranged: [place] (States she "oes not want strangers in her house".) [place]: NA        Prior Living Arrangements/Services   Lives with:: Adult Children (Son, [place]) Patient language and need for interpreter reviewed:: Yes Do you feel safe  going back to the place where you live?: Yes      Need for Family Participation in Patient Care: Yes (Comment) Care giver support system in place?: Yes (comment) Current home services: DME (Rollator, cane, walker) Criminal Activity/Legal Involvement Pertinent to Current Situation/Hospitalization: No - Comment as needed  Activities of Daily Living Home Assistive Devices/Equipment: Walker (specify type) ADL Screening (condition at time of admission) Patient's cognitive ability adequate to safely complete daily activities?: No Is the patient deaf or have difficulty hearing?: No Does the patient have difficulty seeing, even when wearing glasses/contacts?: Yes Does the patient have difficulty concentrating, remembering, or making decisions?: Yes Patient able to express need for assistance with ADLs?: No Does the patient have difficulty dressing or bathing?: Yes Independently performs ADLs?: No Communication: Needs assistance Is this a change from baseline?: Pre-admission baseline Dressing (OT): Dependent Is this a change from baseline?: Pre-admission baseline Grooming: Dependent Is this a change from baseline?: Pre-admission baseline Bathing: Dependent Is this a change from baseline?: Pre-admission baseline Toileting: Dependent Is this a change from baseline?: Pre-admission baseline In/Out Bed: Dependent Is this a change from baseline?: Pre-admission baseline Walks in Home: Dependent Is this a change from baseline?: Pre-admission baseline Does the patient have difficulty walking or climbing stairs?: Yes Weakness of Legs: Both Weakness of Arms/Hands: Both  Permission Sought/Granted Permission sought to share information with : Case Manager, Customer service manager, Family Supports Permission granted to share information with : Yes, Verbal Permission Granted  Share Information with NAME: Judson Roch  Permission granted to share info w AGENCY: [place] granted to share info w Relationship: Rehab Coordinator     Emotional Assessment Appearance:: Appears stated age Attitude/Demeanor/Rapport: Engaged, Gracious Affect (typically observed): Accepting, Appropriate, Calm, Hopeful Orientation: : Oriented to Self, Oriented to Place, Oriented to  Time, Oriented to Situation Alcohol / Substance Use: Not Applicable Psych Involvement: No (comment)  Admission diagnosis:  Pneumonia [J18.9] Acute encephalopathy [G93.40] Patient Active Problem List   Diagnosis Date Noted   Stool incontinence 03/18/2021   Psychomotor agitation 03/16/2021   Pneumonia 74/01/8785   Acute metabolic encephalopathy 76/72/0947   Dyslipidemia 03/15/2021   Fall 12/19/2019   Multiple rib fractures 12/19/2019   Hyperkalemia 12/19/2019   Thoracic ascending aortic aneurysm 12/19/2019   Left displaced femoral neck fracture ([place])    Syncope 06/07/2019   MSSA bacteremia 03/10/2019   Diabetes mellitus ([place]) 03/10/2019   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   Mild aortic stenosis 03/10/2019   HCAP (healthcare-associated pneumonia) 03/09/2019   Heart failure ([place]) 08/31/2018   Anemia of chronic kidney failure, stage 5 ([place]) 08/27/2018   Chronic depression 08/27/2018   Patient is Jehovah's Witness 08/27/2018   Essential hypertension    End-stage renal disease on hemodialysis ([place]) 12/10/2015   PCP:  [name], MD Pharmacy:   [facility] [place], [place] [address] [place] [address] Phone: [phone] Fax: [phone]  [facility] [place], [place] [address] Phone: [phone] Fax: [phone]     Social Determinants of Health (SDOH) Interventions    Readmission Risk Interventions No flowsheet data found.

## 2021-03-20 NOTE — Progress Notes (Signed)
[facility] Progress Note   Subjective:  Seen in dialysis unit. UF goal 2L.  MS improving. Up with PT yesterday.   Objective Vitals:   03/20/21 0822 03/20/21 0830 03/20/21 0900 03/20/21 0930  BP: (!) 144/72 (!) 141/67 (!) 110/56 121/67  Pulse: 66 65 67 65  Resp: (!) 26 18 18 16   Temp:  98.5 F (36.9 C)    TempSrc:  Temporal    SpO2:  93%    Weight:  73.5 kg    Height:         Additional Objective Labs: Basic Metabolic Panel: Recent Labs  Lab 03/18/21 1020 03/19/21 0324 03/20/21 0830  NA 134* 135 132*  K 4.4 4.1 3.8  CL 94* 95* 92*  CO2 22 26 23   GLUCOSE 95 108* 118*  BUN 58* 31* 47*  CREATININE 11.58* 7.46* 9.97*  CALCIUM 8.8* 8.7* 9.1  PHOS 7.1*  --  6.7*    CBC: Recent Labs  Lab 03/15/21 0500 03/15/21 0514 03/16/21 0642 03/18/21 1020 03/20/21 0830  WBC 9.6  --  8.0 6.8 6.2  NEUTROABS 7.9*  --   --   --   --   HGB 13.0   < > 11.9* 11.4* 11.7*  HCT 40.0   < > 37.2 34.2* 37.0  MCV 91.7  --  90.1 89.1 91.4  PLT 293  --  270 269 280   < > = values in this interval not displayed.    Blood Culture    Component Value Date/Time   SDES BLOOD RIGHT HAND 03/16/2021 0642   SPECREQUEST  03/16/2021 0642    BOTTLES DRAWN AEROBIC ONLY Blood Culture adequate volume   CULT  03/16/2021 0642    NO GROWTH 4 DAYS Performed at [facility], [place] [address], [place], [place] [address]    REPTSTATUS PENDING 03/16/2021 8185     Physical Exam General: Elderly woman, in bed, nad  Heart: RRR No m,r,g Lungs: Clear bilaterally  Abdomen: soft non-tender  Extremities: No LE edema  Dialysis Access: LUE AVF +bruit   Medications:  [START ON 03/21/2021] sodium chloride     [START ON 03/21/2021] sodium chloride     lidocaine-prilocaine      amLODipine  10 mg Oral Daily   atorvastatin  10 mg Oral QPM   calcitRIOL  1.5 mcg Oral Q M,W,F-HD   Chlorhexidine Gluconate Cloth  6 each Topical Q0600   docusate sodium  100 mg Oral BID   escitalopram  10 mg  Oral Daily   ferric citrate  420 mg Oral TID WC   heparin  5,000 Units Subcutaneous Q8H   insulin aspart  0-6 Units Subcutaneous TID WC   labetalol  200 mg Oral BID   lidocaine  1 patch Transdermal Q24H   losartan  50 mg Oral Daily   multivitamin  1 tablet Oral QHS   sodium chloride flush  3 mL Intravenous Q12H    Dialysis Orders:  MWF [facility]  3h  75kg  2/2 bath  LUA AVG  Hep 2000   - calcitriol 1.5 ug tiw   - mircera 200 q2, last 12/28  Assessment/Plan: AMS -multifactorial. Missed HD PTA and ESRD pt recently prescribed Valtrex for shingles (son unaware if renally dosed) and gabapentin(also not sure of dosing) = COMMON  Cause of AMS when given to ESRD pt's at the non-ESRD dose.  Has improved some since dialysis,  Valtrex takes at least 2-3 days to wash out  sometimes longer.  Neurontin effects are not as long. Also meds plus infectious process from pneumonia in ESRD patient was seen by psych for agitation meds. MS Improving.  Possible PNA - getting IV abx for suspected CAP, per pmd.  ESRD - on HD MWF.  Back on schedule. HD today.    BP/ volume - BP better. Back on amlodipine, labetalol, hydralazine +losartan  Not vol overloaded on exam. CXR w/ LLL changes, scarring upper lobes.  Did not tolerate large UF goal. UF as able.    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 HH per primary    Lynnda Child PA-C [facility] 03/20/2021,10:01 AM

## 2021-03-20 NOTE — Progress Notes (Signed)
OT Cancellation Note  Patient Details Name: Bianca Myers MRN: [id number] DOB: [birth year]   Cancelled Treatment:    Reason Eval/Treat Not Completed: Patient at procedure or test/ unavailable. Pt at HD, OT will follow up next available time  [name] 03/20/2021, 9:55 AM

## 2021-03-21 ENCOUNTER — Inpatient Hospital Stay (HOSPITAL_COMMUNITY): Payer: Medicare HMO

## 2021-03-21 ENCOUNTER — Other Ambulatory Visit (HOSPITAL_COMMUNITY): Payer: Self-pay

## 2021-03-21 LAB — CULTURE, BLOOD (ROUTINE X 2)
Culture: NO GROWTH
Special Requests: ADEQUATE

## 2021-03-21 LAB — GLUCOSE, CAPILLARY
Glucose-Capillary: 96 mg/dL (ref 70–99)
Glucose-Capillary: 135 mg/dL — ABNORMAL HIGH (ref 70–99)
Glucose-Capillary: 117 mg/dL — ABNORMAL HIGH (ref 70–99)
Glucose-Capillary: 153 mg/dL — ABNORMAL HIGH (ref 70–99)

## 2021-03-21 IMAGING — DX DG CHEST 1V PORT
1 series · 1 of 1 positions shown · non-contrast
Comparison: CT chest dated [DATE]

CLINICAL DATA: Shortness of breath

EXAM:
PORTABLE CHEST 1 VIEW

[chest ap]
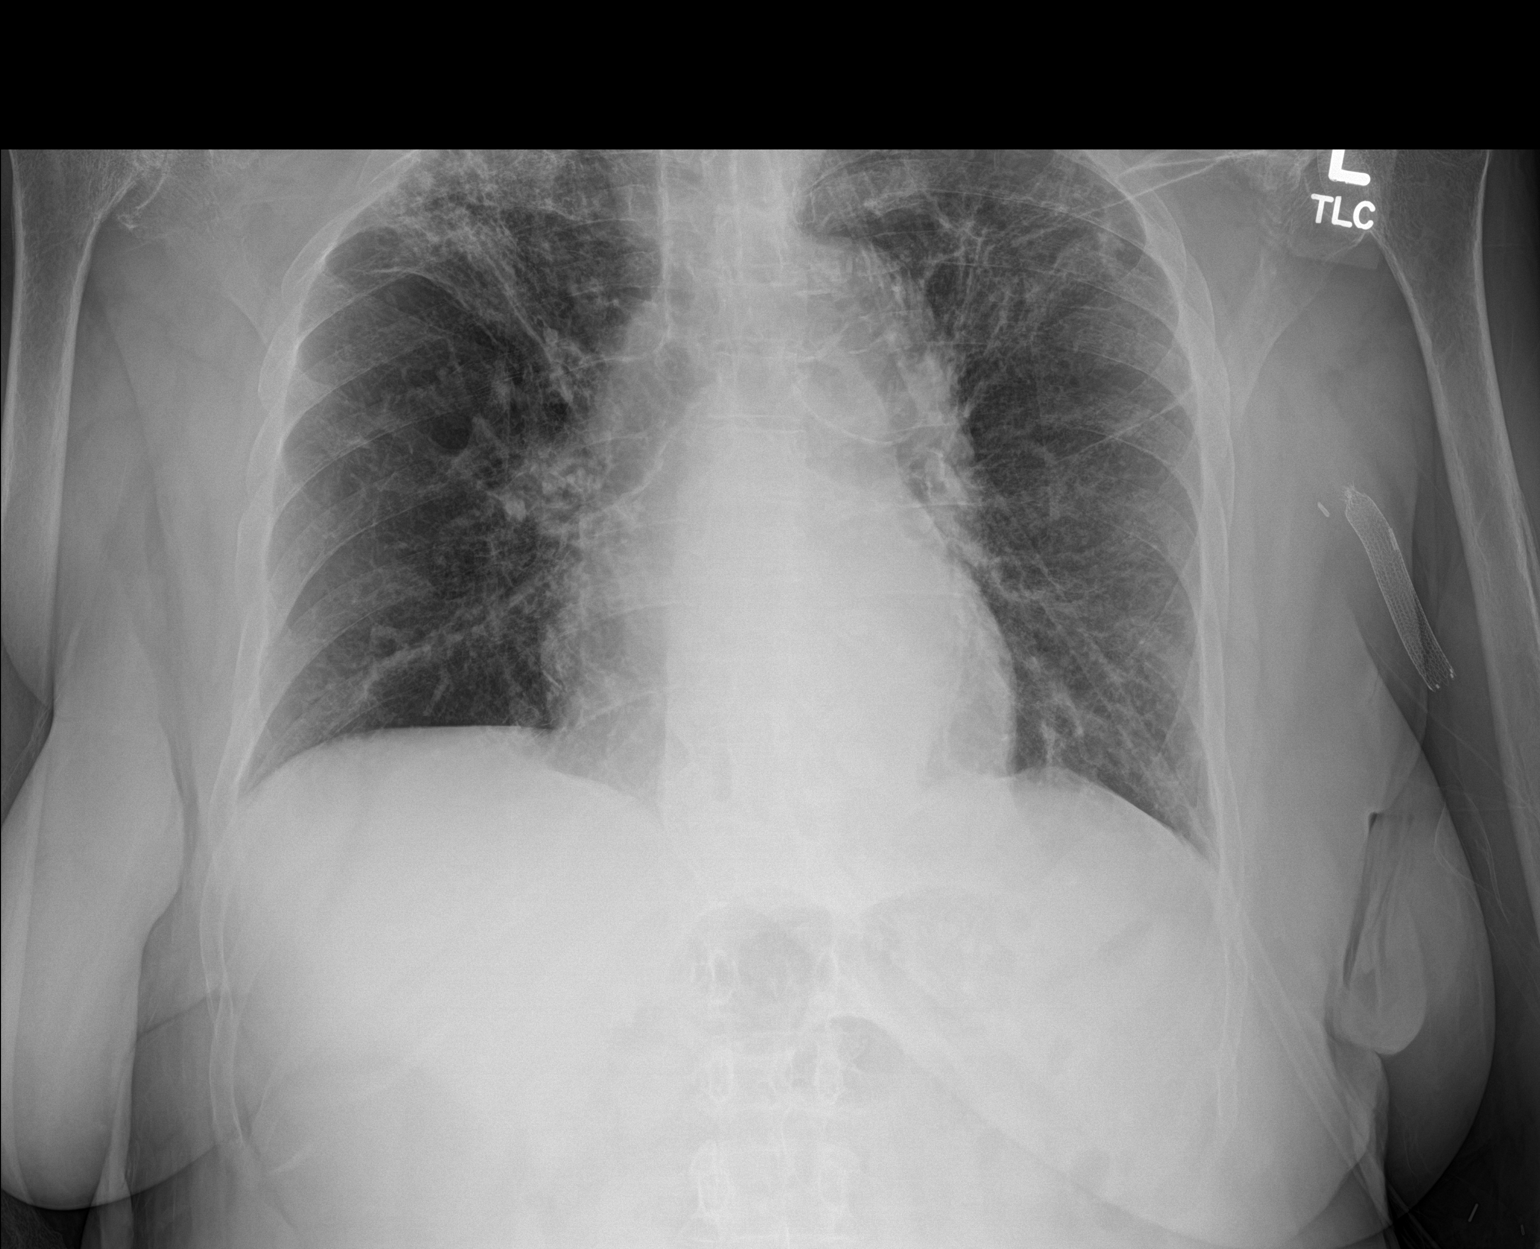

[1 of 1 positions shown; findings below may reference images not displayed]

FINDINGS: The heart size and mediastinal contours are within normal limits.
Vascular stent projecting over the left upper arm. Bilateral upper
lobe predominant fibrosis and cystic changes. No evidence of acute
pneumonia or pleural effusion. No pneumothorax. Advanced bilateral
glenohumeral osteoarthritis.
IMPRESSION: Chronic scarring/cystic changes of bilateral upper lobes without
evidence of acute pulmonary process.

## 2021-03-21 MED ORDER — LOPERAMIDE HCL 2 MG PO CAPS
2.0000 mg | ORAL_CAPSULE | ORAL | 0 refills | Status: DC | PRN
  Filled 2021-03-21: qty 30, 30d supply, fill #0

## 2021-03-21 MED ORDER — CALCITRIOL 0.5 MCG PO CAPS
1.5000 ug | ORAL_CAPSULE | ORAL | 0 refills | Status: AC
  Filled 2021-03-21: qty 36, 28d supply, fill #0

## 2021-03-21 MED ORDER — AMLODIPINE BESYLATE 10 MG PO TABS
10.0000 mg | ORAL_TABLET | Freq: Every day | ORAL | 0 refills | Status: AC
  Filled 2021-03-21: qty 30, 30d supply, fill #0

## 2021-03-21 MED ORDER — LOSARTAN POTASSIUM 50 MG PO TABS
50.0000 mg | ORAL_TABLET | Freq: Every day | ORAL | 0 refills | Status: DC
  Filled 2021-03-21: qty 30, 30d supply, fill #0

## 2021-03-21 MED ORDER — LIDOCAINE 5 % EX PTCH
1.0000 | MEDICATED_PATCH | CUTANEOUS | 0 refills | Status: AC
  Filled 2021-03-21: qty 7, 7d supply, fill #0

## 2021-03-21 NOTE — TOC Progression Note (Deleted)
Transition of Care [facility]) - Progression Note    Patient Details  Name: Bianca Myers MRN: [id number] Date of Birth: December 26, 1945  Transition of Care [facility]) CM/SW Contact  Tom-Johnson, Renea Ee, RN Phone Number: 03/21/2021, 11:09 AM  Clinical Narrative:       Expected Discharge Plan: OP Rehab Barriers to Discharge: Continued Medical Work up  Expected Discharge Plan and Services Expected Discharge Plan: OP Rehab   Discharge Planning Services: CM Consult Post Acute Care Choice:  (Outpatient PT.)   Expected Discharge Date: 03/21/21               DME Arranged: 3-N-1 DME Agency: [employer] Date DME Agency Contacted: 03/20/21 Time DME Agency Contacted: 63 Representative spoke with at DME Agency: Freda Munro [place]: Refused [place] (States she "oes not want strangers in her house".) [place] Agency: NA         Social Determinants of Health ([place]) Interventions    Readmission Risk Interventions Readmission Risk Prevention Plan 03/20/2021  Transportation Screening Complete  PCP or Specialist Appt within 3-5 Days Complete  HRI or [place] Complete  Social Work Consult for [place] Planning/Counseling Complete  Palliative Care Screening Not Applicable  Medication Review Press photographer) Complete  Some recent data might be hidden

## 2021-03-21 NOTE — Plan of Care (Signed)
°  Problem: Clinical Measurements: Goal: Ability to maintain a body temperature in the normal range will improve Outcome: Progressing   

## 2021-03-21 NOTE — TOC Progression Note (Signed)
Transition of Care [facility]) - Progression Note    Patient Details  Name: Bianca Myers MRN: [id number] Date of Birth: 01-Feb-1946  Transition of Care [facility]) CM/SW Contact  Tom-Johnson, Renea Ee, RN Phone Number: 03/21/2021, 11:13 AM  Clinical Narrative:    CM notified MD about patient and son, Jermaine's concern about discharge. MD to call son. CM will continue to follow with needs.    Expected Discharge Plan: OP Rehab Barriers to Discharge: Continued Medical Work up  Expected Discharge Plan and Services Expected Discharge Plan: OP Rehab   Discharge Planning Services: CM Consult Post Acute Care Choice:  (Outpatient PT.)   Expected Discharge Date: 03/21/21               DME Arranged: 3-N-1 DME Agency: [employer] Date DME Agency Contacted: 03/20/21 Time DME Agency Contacted: 6 Representative spoke with at DME Agency: Freda Munro HH Arranged: Refused [place] (States she "oes not want strangers in her house".) [place] Agency: NA         Social Determinants of Health ([place]) Interventions    Readmission Risk Interventions Readmission Risk Prevention Plan 03/20/2021  Transportation Screening Complete  PCP or Specialist Appt within 3-5 Days Complete  HRI or [place] Complete  Social Work Consult for [place] Planning/Counseling Complete  Palliative Care Screening Not Applicable  Medication Review Press photographer) Complete  Some recent data might be hidden

## 2021-03-21 NOTE — Progress Notes (Signed)
Physical Therapy Treatment Patient Details Name: Bianca Myers MRN: [id number] DOB: 07-Dec-1945 Today's Date: 03/21/2021   History of Present Illness Pt is a 76 y.o. F who presents 03/15/2021 with AMS and cough shortly after she was put on a medication for shingles. Significant PMH: anemia, ESRD on HD, gout, HTN.    PT Comments    Pt limited by reports of lightheadedness and dizziness. Pt requires intermittent assistance to transfer from lower surfaces as well as cues to improve technique. PT provides extensive education on management of orthostatic BP, including increased time in an upright position, LE exercise, monitoring BP, and continuing to consume fluids within MD restrictions.   Recommendations for follow up therapy are one component of a multi-disciplinary discharge planning process, led by the attending physician.  Recommendations may be updated based on patient status, additional functional criteria and insurance authorization.  Follow Up Recommendations  Outpatient PT (pt continues to refuse HHPT)     Assistance Recommended at Discharge Intermittent Supervision/Assistance  Patient can return home with the following A little help with walking and/or transfers;A little help with bathing/dressing/bathroom;Assistance with cooking/housework;Help with stairs or ramp for entrance;Assist for transportation   Equipment Recommendations  None recommended by PT    Recommendations for Other Services       Precautions / Restrictions Precautions Precautions: Fall Precaution Comments: monitor for orthostatic BP Restrictions Weight Bearing Restrictions: No     Mobility  Bed Mobility Overal bed mobility: Needs Assistance Bed Mobility: Supine to Sit, Sit to Supine     Supine to sit: Supervision, HOB elevated Sit to supine: Supervision, HOB elevated        Transfers Overall transfer level: Needs assistance Equipment used: Rolling walker (2 wheels) Transfers: Sit to/from  Stand Sit to Stand: Min guard, Min assist           General transfer comment: minA from lower bed surface, minG from [facility]. Verbal cues for hand placement and trunk flexion    Ambulation/Gait Ambulation/Gait assistance: Min guard Gait Distance (Feet): 15 Feet (15' x 2) Assistive device: Rolling walker (2 wheels) Gait Pattern/deviations: Step-through pattern Gait velocity: reduced Gait velocity interpretation: <1.8 ft/sec, indicate of risk for recurrent falls   General Gait Details: pt with slowed step-through gait, reduced stride length   Stairs             Wheelchair Mobility    Modified Rankin (Stroke Patients Only)       Balance Overall balance assessment: Needs assistance Sitting-balance support: No upper extremity supported, Feet supported Sitting balance-Leahy Scale: Good     Standing balance support: Bilateral upper extremity supported, Reliant on assistive device for balance Standing balance-Leahy Scale: Poor                              Cognition Arousal/Alertness: Awake/alert Behavior During Therapy: WFL for tasks assessed/performed Overall Cognitive Status: Impaired/Different from baseline Area of Impairment: Memory, Problem solving                     Memory: Decreased short-term memory       Problem Solving: Slow processing          Exercises      General Comments General comments (skin integrity, edema, etc.): pt reports lightheadedness when sitting up and mobilizing during session. BP obtained during session 98/61 after utilizing bathroom and returning to bed. PT provides education on the need for increased time out of bed  and in an upright position. PT also encourages LE exercise prior to changing positions.      Pertinent Vitals/Pain Pain Assessment Pain Assessment: No/denies pain    Home Living                          Prior Function            PT Goals (current goals can now be found in the  care plan section) Acute Rehab PT Goals Patient Stated Goal: get out of bed Progress towards PT goals: Progressing toward goals    Frequency    Min 3X/week      PT Plan Current plan remains appropriate    Co-evaluation              AM-PAC PT "6 Clicks" Mobility   Outcome Measure  Help needed turning from your back to your side while in a flat bed without using bedrails?: None Help needed moving from lying on your back to sitting on the side of a flat bed without using bedrails?: A Little Help needed moving to and from a bed to a chair (including a wheelchair)?: A Little Help needed standing up from a chair using your arms (e.g., wheelchair or bedside chair)?: A Little Help needed to walk in hospital room?: A Little Help needed climbing 3-5 steps with a railing? : A Lot 6 Click Score: 18    End of Session   Activity Tolerance: Treatment limited secondary to medical complications (Comment) (dizziness/lightheadedness) Patient left: in chair;with call bell/phone within reach;with chair alarm set;with family/visitor present Nurse Communication: Mobility status PT Visit Diagnosis: Unsteadiness on feet (R26.81);Other abnormalities of gait and mobility (R26.89);Difficulty in walking, not elsewhere classified (R26.2)     Time: 2297-9892 PT Time Calculation (min) (ACUTE ONLY): 48 min  Charges:  $Gait Training: 8-22 mins $Therapeutic Activity: 23-37 mins                     Zenaida Niece, PT, DPT Acute Rehabilitation Pager: [phone] Office [place] 03/21/2021, 2:23 PM

## 2021-03-21 NOTE — TOC Progression Note (Addendum)
Transition of Care [facility]) - Progression Note    Patient Details  Name: Giliana Vantil MRN: [id number] Date of Birth: 1946-01-03  Transition of Care [facility]) CM/SW Contact  Tom-Johnson, Renea Ee, RN Phone Number: 03/21/2021, 11:01 AM  Clinical Narrative:     CM spoke with patient's son, Brenton Grills at bedside about discharge appeal. Jermaine notified CM that patient was told she will be discharged and he does not think patient is safe or medically ready for discharge as patient returned from dialysis weak and dizzy. Patient verified to CM that she was told she will be discharged and does not feel ready. Jermaine asked about the Medicare document about discharge and the Medicare Important Message document was on patient's bedside table. CM explained to [facility] and patient that they have a right to appeal if they feel patient is not medically ready and the number to call was on the document. Jermaine and patient voiced understanding.     Expected Discharge Plan: OP Rehab Barriers to Discharge: Continued Medical Work up  Expected Discharge Plan and Services Expected Discharge Plan: OP Rehab   Discharge Planning Services: CM Consult Post Acute Care Choice:  (Outpatient PT.)   Expected Discharge Date: 03/21/21               DME Arranged: 3-N-1 DME Agency: [employer] Date DME Agency Contacted: 03/20/21 Time DME Agency Contacted: 36 Representative spoke with at DME Agency: Freda Munro HH Arranged: Refused [place] (States she "oes not want strangers in her house".) [place] Agency: NA         Social Determinants of Health ([place]) Interventions    Readmission Risk Interventions Readmission Risk Prevention Plan 03/20/2021  Transportation Screening Complete  PCP or Specialist Appt within 3-5 Days Complete  HRI or [place] Complete  Social Work Consult for [place] Planning/Counseling Complete  Palliative Care Screening Not Applicable  Medication Review Press photographer)  Complete  Some recent data might be hidden

## 2021-03-21 NOTE — Progress Notes (Signed)
[facility] Progress Note   Subjective:  Seen in room. Had dialysis yesterday, but had an episode of dyspnea, then was dizzy after dialysis.  Better this am, but states she feels too weak to go home.   Objective Vitals:   03/20/21 1321 03/20/21 1648 03/20/21 2037 03/21/21 0432  BP: 111/71 118/76 115/69 140/77  Pulse: 69 (!) 58 74 66  Resp: 18 18 18 17   Temp: 98.4 F (36.9 C) 98.4 F (36.9 C) 99.5 F (37.5 C)   TempSrc: Oral Oral Oral   SpO2: 92% 100% 100% 95%  Weight:      Height:         Additional Objective Labs: Basic Metabolic Panel: Recent Labs  Lab 03/18/21 1020 03/19/21 0324 03/20/21 0830  NA 134* 135 132*  K 4.4 4.1 3.8  CL 94* 95* 92*  CO2 22 26 23   GLUCOSE 95 108* 118*  BUN 58* 31* 47*  CREATININE 11.58* 7.46* 9.97*  CALCIUM 8.8* 8.7* 9.1  PHOS 7.1*  --  6.7*    CBC: Recent Labs  Lab 03/15/21 0500 03/15/21 0514 03/16/21 0642 03/18/21 1020 03/20/21 0830  WBC 9.6  --  8.0 6.8 6.2  NEUTROABS 7.9*  --   --   --   --   HGB 13.0   < > 11.9* 11.4* 11.7*  HCT 40.0   < > 37.2 34.2* 37.0  MCV 91.7  --  90.1 89.1 91.4  PLT 293  --  270 269 280   < > = values in this interval not displayed.    Blood Culture    Component Value Date/Time   SDES BLOOD RIGHT HAND 03/16/2021 0642   SPECREQUEST  03/16/2021 0642    BOTTLES DRAWN AEROBIC ONLY Blood Culture adequate volume   CULT  03/16/2021 0642    NO GROWTH 5 DAYS Performed at [facility], [place] [address] [place], [place] [address]    REPTSTATUS 03/21/2021 FINAL 03/16/2021 1937     Physical Exam General: Elderly woman, in bed, nad  Heart: RRR No m,r,g Lungs: Clear bilaterally  Abdomen: soft non-tender  Extremities: No LE edema  Dialysis Access: LUE AVF +bruit   Medications:  lidocaine-prilocaine      amLODipine  10 mg Oral Daily   atorvastatin  10 mg Oral QPM   calcitRIOL  1.5 mcg Oral Q M,W,F-HD   Chlorhexidine Gluconate Cloth  6 each Topical Q0600   docusate  sodium  100 mg Oral BID   escitalopram  10 mg Oral Daily   ferric citrate  420 mg Oral TID WC   heparin  5,000 Units Subcutaneous Q8H   insulin aspart  0-6 Units Subcutaneous TID WC   labetalol  200 mg Oral BID   lidocaine  1 patch Transdermal Q24H   losartan  50 mg Oral Daily   multivitamin  1 tablet Oral QHS   sodium chloride flush  3 mL Intravenous Q12H    Dialysis Orders:  MWF [facility]  3h  75kg  2/2 bath  LUA AVG  Hep 2000   - calcitriol 1.5 ug tiw   - mircera 200 q2, last 12/28  Assessment/Plan: AMS -multifactorial. Missed HD PTA and ESRD pt recently prescribed Valtrex for shingles (son unaware if renally dosed) and gabapentin(also not sure of dosing) = COMMON  Cause of AMS when given to ESRD pt's at the non-ESRD dose.  Has improved some since dialysis,  Valtrex takes at least 2-3 days to wash out  sometimes  longer.  Neurontin effects are not as long. Also meds plus infectious process from pneumonia in ESRD patient was seen by psych for agitation meds. MS Improving. Appears back to baseline.  Possible PNA - completed IV abx course, per pmd.  ESRD - on HD MWF.  Back on schedule. Next HD 1/20. Will write orders in case still here tomorrow.    BP/ volume - BP better. Back on amlodipine, labetalol, hydralazine +losartan  Not vol overloaded on exam. CXR w/ LLL changes, scarring upper lobes.  Does not tolerate large UF goals. UF as able. Lower EDW at discharge.   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 HH OT/PT  --per primary    Lynnda Child PA-C [facility] 03/21/2021,9:49 AM

## 2021-03-21 NOTE — Progress Notes (Signed)
Contacted attending regarding plans for pt for tomorrow since pt scheduled for HD. MD plans for pt to receive HD here in order to observe how pt tolerates treatment. Will assist as needed.   Melven Sartorius Renal Navigator [phone]

## 2021-03-22 LAB — GLUCOSE, CAPILLARY
Glucose-Capillary: 105 mg/dL — ABNORMAL HIGH (ref 70–99)
Glucose-Capillary: 141 mg/dL — ABNORMAL HIGH (ref 70–99)
Glucose-Capillary: 106 mg/dL — ABNORMAL HIGH (ref 70–99)
Glucose-Capillary: 130 mg/dL — ABNORMAL HIGH (ref 70–99)
Glucose-Capillary: 130 mg/dL — ABNORMAL HIGH (ref 70–99)

## 2021-03-22 MED ORDER — BACID PO TABS
2.0000 | ORAL_TABLET | Freq: Two times a day (BID) | ORAL | Status: DC
  Filled 2021-03-22 (×2): qty 2

## 2021-03-22 MED ORDER — RISAQUAD PO CAPS
2.0000 | ORAL_CAPSULE | Freq: Every day | ORAL | Status: DC
  Administered 2021-03-22 – 2021-03-23 (×2): 2 via ORAL
  Filled 2021-03-22 (×3): qty 2

## 2021-03-22 NOTE — Progress Notes (Signed)
PT Cancellation Note  Patient Details Name: Bianca Myers MRN: [id number] DOB: Mar 01, 1946   Cancelled Treatment:    Reason Eval/Treat Not Completed: Patient declined, no reason specified. Pt declines PT session, expressing great anger over uncertainty of when she will go to dialysis this evening. PT will follow as time allows.   Zenaida Niece 03/22/2021, 5:34 PM

## 2021-03-22 NOTE — Progress Notes (Signed)
[facility] Progress Note   Subjective:    Seen and examined patient at bedside. She reports feeling "wiped out" after HD and doesn't feel comfortable for discharge today after HD. Also spoke with Case Manager who informed me she decline HH and rehab. Currently denies SOB, CP, and N/V at this time. Plan for HD this afternoon.  Objective Vitals:   03/21/21 1718 03/21/21 2042 03/22/21 0350 03/22/21 0858  BP: 114/80 (!) 140/99 120/68 99/67  Pulse: 66 68 65 66  Resp: 17 18 17 16   Temp: 98.5 F (36.9 C) 98.9 F (37.2 C) 98.7 F (37.1 C) 98.3 F (36.8 C)  TempSrc: Oral Oral Oral Oral  SpO2: 100% 95% 98% 100%  Weight:      Height:       Physical Exam General: Elderly woman; lying in bed; NAD Heart: Normal S1 and S2; No murmurs, gallops, or rubs Lungs: Clear throughout; No wheezing, rales, or rhonchi Abdomen: Soft and non-tender Extremities: No edema BLLE Dialysis Access: L AVF (+) Bruit/Thrill   Filed Weights   03/20/21 0434 03/20/21 0830 03/20/21 1140  Weight: 73.4 kg 73.5 kg 72 kg    Intake/Output Summary (Last 24 hours) at 03/22/2021 1055 Last data filed at 03/22/2021 1025 Gross per 24 hour  Intake 800 ml  Output 0 ml  Net 800 ml    Additional Objective Labs: Basic Metabolic Panel: Recent Labs  Lab 03/18/21 1020 03/19/21 0324 03/20/21 0830  NA 134* 135 132*  K 4.4 4.1 3.8  CL 94* 95* 92*  CO2 22 26 23   GLUCOSE 95 108* 118*  BUN 58* 31* 47*  CREATININE 11.58* 7.46* 9.97*  CALCIUM 8.8* 8.7* 9.1  PHOS 7.1*  --  6.7*   Liver Function Tests: Recent Labs  Lab 03/18/21 1020 03/20/21 0830  ALBUMIN 3.2* 3.3*   No results for input(s): LIPASE, AMYLASE in the last 168 hours. CBC: Recent Labs  Lab 03/16/21 0642 03/18/21 1020 03/20/21 0830  WBC 8.0 6.8 6.2  HGB 11.9* 11.4* 11.7*  HCT 37.2 34.2* 37.0  MCV 90.1 89.1 91.4  PLT 270 269 280   Blood Culture    Component Value Date/Time   SDES BLOOD RIGHT HAND 03/16/2021 0642   SPECREQUEST   03/16/2021 0642    BOTTLES DRAWN AEROBIC ONLY Blood Culture adequate volume   CULT  03/16/2021 0642    NO GROWTH 5 DAYS Performed at [facility], [place] [address] [place], [place] [address]    REPTSTATUS 03/21/2021 FINAL 03/16/2021 7371    Cardiac Enzymes: No results for input(s): CKTOTAL, CKMB, CKMBINDEX, TROPONINI in the last 168 hours. CBG: Recent Labs  Lab 03/21/21 1118 03/21/21 1631 03/21/21 2039 03/22/21 0616 03/22/21 0946  GLUCAP 135* 117* 153* 105* 141*   Iron Studies: No results for input(s): IRON, TIBC, TRANSFERRIN, FERRITIN in the last 72 hours. Lab Results  Component Value Date   INR 1.3 (H) 06/08/2019   INR 1.2 08/24/2018   INR 1.1 08/21/2018   Studies/Results: DG Chest Port 1 View  Result Date: 03/21/2021 CLINICAL DATA:  Shortness of breath EXAM: PORTABLE CHEST 1 VIEW COMPARISON:  CT chest dated March 17, 2021 FINDINGS: The heart size and mediastinal contours are within normal limits. Vascular stent projecting over the left upper arm. Bilateral upper lobe predominant fibrosis and cystic changes. No evidence of acute pneumonia or pleural effusion. No pneumothorax. Advanced bilateral glenohumeral osteoarthritis. IMPRESSION: Chronic scarring/cystic changes of bilateral upper lobes without evidence of acute pulmonary process. Electronically Signed  By: Keane Police D.O.   On: 03/21/2021 11:05    Medications:  lidocaine-prilocaine      amLODipine  10 mg Oral Daily   atorvastatin  10 mg Oral QPM   calcitRIOL  1.5 mcg Oral Q M,W,F-HD   Chlorhexidine Gluconate Cloth  6 each Topical Q0600   docusate sodium  100 mg Oral BID   escitalopram  10 mg Oral Daily   ferric citrate  420 mg Oral TID WC   heparin  5,000 Units Subcutaneous Q8H   insulin aspart  0-6 Units Subcutaneous TID WC   labetalol  200 mg Oral BID   lidocaine  1 patch Transdermal Q24H   losartan  50 mg Oral Daily   multivitamin  1 tablet Oral QHS   sodium chloride flush  3 mL Intravenous Q12H     Dialysis Orders: MWF [facility] 3h  75kg  2/2 bath  LUA AVG  Hep 2000 - calcitriol 1.5 ug tiw - mircera 200 q2, last 12/28  Assessment/Plan: AMS -multifactorial. Missed HD PTA and ESRD pt recently prescribed Valtrex for shingles (son unaware if renally dosed) and gabapentin(also not sure of dosing) = COMMON  Cause of AMS when given to ESRD pt's at the non-ESRD dose.  Has improved some since dialysis,  Valtrex takes at least 2-3 days to wash out  sometimes longer.  Neurontin effects are not as long. Also meds plus infectious process from pneumonia in ESRD patient was seen by psych for agitation meds. MS Improving. Appears back to baseline.  Possible PNA - completed IV abx course, per pmd.  ESRD - on HD MWF.  Back on schedule. Plan for HD this afternoon.     BP/ volume - BP better. Back on amlodipine, labetalol, hydralazine +losartan  Not vol overloaded on exam. CXR w/ LLL changes, scarring upper lobes.  Does not tolerate large UF goals. UF as able. Lower EDW at discharge.  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 Spoke with case manager who informed me she declines rehab/SNF and HH OT/PT-reason unclear. I will reach out to son today. If she absolutely refuses discharge today, I'm hopeful for discharge first thing in the morning.  Bianca Poet, NP [facility] 03/22/2021,10:55 AM  LOS: 7 days

## 2021-03-22 NOTE — Progress Notes (Signed)
Contacted [place] to make clinic aware of pt's possible d/c this weekend and to resume on Monday.   Melven Sartorius Renal Navigator [phone]

## 2021-03-22 NOTE — Progress Notes (Signed)
Occupational Therapy Treatment Patient Details Name: Bianca Myers MRN: [id number] DOB: [birth year] Today's Date: 03/22/2021   History of present illness Pt is a 76 y.o. F who presents 03/15/2021 with AMS and cough shortly after she was put on a medication for shingles. Significant PMH: anemia, ESRD on HD, gout, HTN.   OT comments  Patient received in bed and agreeable to OT session. Patient was supervision to get to EOB and min assist to power up and min guard to transfer to [facility].  Patient ambulate short distance from [facility] to sink for grooming tasks and required seated rest break following.  Patient returned to bed due to scheduled for HD today.  Patient required frequent rest break following bed mobility, transfers, and standing tasks due to complaints of dizziness. Patient making good gains with OT. Acute OT to continue to follow.    Recommendations for follow up therapy are one component of a multi-disciplinary discharge planning process, led by the attending physician.  Recommendations may be updated based on patient status, additional functional criteria and insurance authorization.    Follow Up Recommendations  Home health OT    Assistance Recommended at Discharge Intermittent Supervision/Assistance  Patient can return home with the following  A little help with walking and/or transfers;A little help with bathing/dressing/bathroom;Assistance with cooking/housework;Direct supervision/assist for medications management;Direct supervision/assist for financial management;Assist for transportation   Equipment Recommendations  BSC/3in1    Recommendations for Other Services      Precautions / Restrictions Precautions Precautions: Fall Precaution Comments: monitor for orthostatic BP       Mobility Bed Mobility Overal bed mobility: Needs Assistance Bed Mobility: Supine to Sit, Sit to Supine     Supine to sit: Supervision, HOB elevated Sit to supine: Min guard, HOB elevated    General bed mobility comments: min guard due to fatigue to return to supine    Transfers Overall transfer level: Needs assistance Equipment used: Rolling walker (2 wheels) Transfers: Sit to/from Stand Sit to Stand: Min guard     Step pivot transfers: Min guard     General transfer comment: performed transfer to [facility] and EOB     Balance Overall balance assessment: Needs assistance Sitting-balance support: No upper extremity supported, Feet supported Sitting balance-Leahy Scale: Good     Standing balance support: Single extremity supported, Bilateral upper extremity supported, During functional activity Standing balance-Leahy Scale: Poor Standing balance comment: stood at sink for grooming                           ADL either performed or assessed with clinical judgement   ADL Overall ADL's : Needs assistance/impaired     Grooming: Wash/dry hands;Wash/dry face;Oral care;Min guard;Standing Grooming Details (indicate cue type and reason): performed grooming standing, required seated rest break after             Lower Body Dressing: Minimal assistance Lower Body Dressing Details (indicate cue type and reason): able to donn sock on RLE, required assistance to get sock started on LLE Toilet Transfer: Min guard;Minimal Producer, television/film/video Details (indicate cue type and reason): min assist to power up from bed with verbal cues for hand placement. Transferred from EOB to [place] and Hygiene: Set up;Sitting/lateral lean Toileting - Clothing Manipulation Details (indicate cue type and reason): performed toilet hygiene seated     Functional mobility during ADLs: Min guard;Rolling walker (2 wheels) General ADL Comments: ambulated from [facility] to sink.  Able to power  up from [facility] with rails.    Extremity/Trunk Assessment              Vision       Perception     Praxis      Cognition Arousal/Alertness:  Awake/alert Behavior During Therapy: WFL for tasks assessed/performed Overall Cognitive Status: Impaired/Different from baseline Area of Impairment: Memory, Problem solving                 Orientation Level: Disoriented to, Situation Current Attention Level: Sustained Memory: Decreased short-term memory Following Commands: Follows one step commands with increased time Safety/Judgement: Decreased awareness of safety, Decreased awareness of deficits Awareness: Emergent Problem Solving: Slow processing General Comments: oriented to date and time        Exercises      Shoulder Instructions       General Comments      Pertinent Vitals/ Pain       Pain Assessment Pain Assessment: Faces Faces Pain Scale: Hurts little more Pain Location: back Pain Descriptors / Indicators: Aching, Discomfort, Grimacing Pain Intervention(s): Limited activity within patient's tolerance, Monitored during session, Heat applied  Home Living                                          Prior Functioning/Environment              Frequency  Min 2X/week        Progress Toward Goals  OT Goals(current goals can now be found in the care plan section)  Progress towards OT goals: Progressing toward goals  Acute Rehab OT Goals Patient Stated Goal: get stronger OT Goal Formulation: With patient Time For Goal Achievement: 03/31/21 Potential to Achieve Goals: Good ADL Goals Pt Will Perform Grooming: with supervision;sitting Pt Will Transfer to Toilet: with min guard assist;stand pivot transfer;bedside commode Additional ADL Goal #1: pt will complete med cog with score 8 or greater to show decrease fall risk with adls Additional ADL Goal #2: pt will follow 3 step command 50% of session  Plan Discharge plan remains appropriate    Co-evaluation                 AM-PAC OT "6 Clicks" Daily Activity     Outcome Measure   Help from another person eating meals?:  None Help from another person taking care of personal grooming?: A Little Help from another person toileting, which includes using toliet, bedpan, or urinal?: A Little Help from another person bathing (including washing, rinsing, drying)?: A Little Help from another person to put on and taking off regular upper body clothing?: A Little Help from another person to put on and taking off regular lower body clothing?: A Little 6 Click Score: 19    End of Session Equipment Utilized During Treatment: Gait belt;Rolling walker (2 wheels)  OT Visit Diagnosis: Unsteadiness on feet (R26.81)   Activity Tolerance Patient tolerated treatment well;Patient limited by fatigue   Patient Left in bed;with call bell/phone within reach;with bed alarm set   Nurse Communication Mobility status        Time: 0254-2706 OT Time Calculation (min): 32 min  Charges: OT General Charges $OT Visit: 1 Visit OT Treatments $Self Care/Home Management : 23-37 mins  Lodema Hong, [place]  Pager [phone] Office [place] 03/22/2021, 9:57 AM

## 2021-03-22 NOTE — Discharge Summary (Addendum)
PROGRESS NOTE    Bianca Myers  BMW:[id number] DOB: [birth year] DOA: 03/15/2021 PCP: [name], MD   Chief Complaint  Patient presents with   Altered Mental Status   Weakness  Brief Narrative/Hospital Course: Bianca Myers, 76 y.o. female with PMH of ESRD on HD MWF,DM,HTN, depression presenting with AMS. She was treated outpatient for pneumonia 10 days ago, continues to have cough and SOB, was seen again for left flank pain a couple days and started on gabapentin and valacyclovir for suspected zoster without rash, and has since developed worsening speech and cognitive disturbance. She was last dialyzed on Monday. ED workup notable for potassium 6.4, BUN 82, and suspected pneumonia. No rash in ED  Nephrology was consulted by ED and she was given vancomycin and Zosyn. Patient was admitted underwent hemodialysis Valtrex and gabapentin was discontinued.  She was placed on empiric antibiotics for pneumonia. Mental status significantly improved seen by psychiatrist no further recommendation, seen by PT OT and has advised skilled nursing facility initially subsequently home health PT but patient declining and planning for outpatient PT   Subjective: Seen and examined this morning.  Worked with OT earlier Saw in conjunction with nephrology at the bedside. Complains of bed giving her back sore. Seen today denies any major complaints. Overnight no acute events, doing well on room air, afebrile Blood sugar stable BM x 4 Son concerned that patient having shortness of breath during last dialysis and would like to see how she does today.   Assessment & Plan: Acute metabolic encephalopathy UVO:[id number] in the setting of missed dialysis and  2/2 gabapentin and valacyclovir-valacyclovir.Suspecting acute metabolic encephalopathy.  Mentation overall stable but remains weak and deconditioned, refusing SNF or home health.CT head with mild cerebral atrophy with extensive chronic microvascular  ischemic changes. Continue Lexapro. son informed to be careful about renal dose.    Bronchitis/developing pneumonia on chest x-ray 1/13 : Completed antibiotics 03/19/21.  Repeat chest x-ray stable   Lul density:CT chest showed chronic scarring in the bilateral upper lobes and superior segment of right lower lower lob.  No evidence of nodule/malignancy.  Acute posterior left eighth rib fracture: Patient has been complaining pain for several days as per the son possible fall at home as she has had a fall previously causing fractures in the past.  Cont  lidocaine patch,tylenol, IS.  ESRD on HD MWF: Had missed outpatient dialysis.  For HD again today, Son concerned about shortness of breath during dialysis and was dizzy afterwards-does not tolerate large UF goals UF as able, lower EDW at discharge per nephro. On binders but some diarrhea/incontinence -asked nephrologist to look into.  Stool incontinence:GI panel negative, cont Imodium PRN  Dyslipidemia: cont Lipitor  Diabetes mellitus:1c stable 5.2, blood sugar  stable . Monitor Recent Labs  Lab 03/21/21 0655 03/21/21 1118 03/21/21 1631 03/21/21 2039 03/22/21 0616  GLUCAP 96 135* 117* 153* 105*     Essential hypertension: Has been poorly controlled -nephrology has increased amlodipine to 10 mg and added losartan to 50 mg, on labetalolk, now off hydralazine.  Watch for soft blood pressure given her dizziness post HD.Orthostatics were negative w/ PT on 1/18.  Monitor BP.   Anemia of chronic kidney disease-stable hemoglobin. Recent Labs  Lab 03/16/21 0642 03/18/21 1020 03/20/21 0830  HGB 11.9* 11.4* 11.7*  HCT 37.2 34.2* 37.0     Deconditioning/debility/ gen weakness:Physical deconditioning debility in the setting of multiple complex comorbidities.  Patient complains of being lightheaded and dizzy seen by physical therapy and reevaluation  may benefit with a skilled nursing facility-at this time patient wants to go with outpatient PT/OT  declined Hhpt.  With OT this morning supervision to get EOB and min assist to power up and minimal guard to transfer to [facility], ambulated short distance from [facility] to sink for grooming task and required seated rest break following, making good gains.  DVT prophylaxis:heparin injection 5,000 Units Start: 03/15/21 1400 Code Status:   Code Status: Full Code Family Communication: plan of care discussed with patient at bedside. I had called son and udpated 1/19 he states patient should not be discharged on the day of dialysis.   Status is: Inpatient Remains inpatient appropriate because: Due to ongoing management of debility deconditioning ESRD on HD having issues with shortness of breath.   Disposition: Currently medically stable. Anticipated Disposition: OPPT. Cont ptot.  Hopefully discharge over the weekend. Discussed with son-he prefers patient to discharge early morning tomorrow as she is usually fatigued and tired on the day of dialysis.  Total time spent in the care of this patient  35 MINUTES Objective: Vitals last 24 hrs: Vitals:   03/21/21 1052 03/21/21 1718 03/21/21 2042 03/22/21 0350  BP: 114/60 114/80 (!) 140/99 120/68  Pulse: 63 66 68 65  Resp: 19 17 18 17   Temp: 98.7 F (37.1 C) 98.5 F (36.9 C) 98.9 F (37.2 C) 98.7 F (37.1 C)  TempSrc: Oral Oral Oral Oral  SpO2: 100% 100% 95% 98%  Weight:      Height:       Weight change:   Intake/Output Summary (Last 24 hours) at 03/22/2021 0731 Last data filed at 03/22/2021 0322 Gross per 24 hour  Intake 540 ml  Output 0 ml  Net 540 ml    Net IO Since Admission: -2,918.49 mL [03/22/21 0731]   Physical Examination: General exam:AAOx 3,pleasant, NAD HEENT:Oral mucosa moist, Ear/Nose WNL grossly, dentition normal. Respiratory system:bilaterally clear,no use of accessory muscle Cardiovascular system: S1 & S2 +,No JVD. Gastrointestinal system: Abdomen soft,NT,ND, BS+. Nervous System:Alert, awake, moving extremities and grossly  nonfocal Extremities:no edema,distal peripheral pulses palpable.  Skin:No rashes,no icterus. FYB:OFBPZW muscle bulk,tone, power.   Medications reviewed: Scheduled Meds:  amLODipine  10 mg Oral Daily   atorvastatin  10 mg Oral QPM   calcitRIOL  1.5 mcg Oral Q M,W,F-HD   Chlorhexidine Gluconate Cloth  6 each Topical Q0600   docusate sodium  100 mg Oral BID   escitalopram  10 mg Oral Daily   ferric citrate  420 mg Oral TID WC   heparin  5,000 Units Subcutaneous Q8H   insulin aspart  0-6 Units Subcutaneous TID WC   labetalol  200 mg Oral BID   lidocaine  1 patch Transdermal Q24H   losartan  50 mg Oral Daily   multivitamin  1 tablet Oral QHS   sodium chloride flush  3 mL Intravenous Q12H   Continuous Infusions:  lidocaine-prilocaine     Diet Order             Diet renal with fluid restriction Fluid restriction: 1200 mL Fluid; Room service appropriate? Yes with Assist; Fluid consistency: Thin  Diet effective ____                 Weight change:   Wt Readings from Last 3 Encounters:  03/20/21 72 kg  12/26/19 74.8 kg  07/28/19 77.1 kg     Consultants:see note  Procedures:see note Antimicrobials: Anti-infectives (From admission, onward)    Start     Dose/Rate Route  Frequency Ordered Stop   03/16/21 1000  azithromycin (ZITHROMAX) tablet 500 mg        500 mg Oral Daily 03/16/21 0753 03/19/21 1024   03/15/21 1000  cefTRIAXone (ROCEPHIN) 2 g in sodium chloride 0.9 % 100 mL IVPB        2 g 200 mL/hr over 30 Minutes Intravenous Every 24 hours 03/15/21 0946 03/19/21 1057   03/15/21 1000  azithromycin (ZITHROMAX) 500 mg in sodium chloride 0.9 % 250 mL IVPB  Status:  Discontinued        500 mg 250 mL/hr over 60 Minutes Intravenous Every 24 hours 03/15/21 0946 03/16/21 0753   03/15/21 0630  vancomycin (VANCOREADY) IVPB 1500 mg/300 mL        1,500 mg 150 mL/hr over 120 Minutes Intravenous  Once 03/15/21 0621 03/15/21 1030   03/15/21 0630  piperacillin-tazobactam (ZOSYN) IVPB  3.375 g        3.375 g 100 mL/hr over 30 Minutes Intravenous  Once 03/15/21 0621 03/15/21 0817      Culture/Microbiology    Component Value Date/Time   SDES BLOOD RIGHT HAND 03/16/2021 0642   SPECREQUEST  03/16/2021 0642    BOTTLES DRAWN AEROBIC ONLY Blood Culture adequate volume   CULT  03/16/2021 0642    NO GROWTH 5 DAYS Performed at [facility], [place] [address] [place],  [address]    REPTSTATUS 03/21/2021 FINAL 03/16/2021 0938    Other culture-see note  Unresulted Labs (From admission, onward)    None     Data Reviewed: I have personally reviewed following labs and imaging studies CBC: Recent Labs  Lab 03/16/21 0642 03/18/21 1020 03/20/21 0830  WBC 8.0 6.8 6.2  HGB 11.9* 11.4* 11.7*  HCT 37.2 34.2* 37.0  MCV 90.1 89.1 91.4  PLT 270 269 182    Basic Metabolic Panel: Recent Labs  Lab 03/17/21 0730 03/18/21 0621 03/18/21 1020 03/19/21 0324 03/20/21 0830  NA 139 133* 134* 135 132*  K 4.6 4.3 4.4 4.1 3.8  CL 95* 96* 94* 95* 92*  CO2 26 21* 22 26 23   GLUCOSE 98 92 95 108* 118*  BUN 49* 56* 58* 31* 47*  CREATININE 9.63* 11.26* 11.58* 7.46* 9.97*  CALCIUM 8.7* 9.3 8.8* 8.7* 9.1  PHOS  --   --  7.1*  --  6.7*    GFR: Estimated Creatinine Clearance: 4.7 mL/min (A) (by C-G formula based on SCr of 9.97 mg/dL (H)). Liver Function Tests: Recent Labs  Lab 03/18/21 1020 03/20/21 0830  ALBUMIN 3.2* 3.3*    No results for input(s): LIPASE, AMYLASE in the last 168 hours. No results for input(s): AMMONIA in the last 168 hours.  Coagulation Profile: No results for input(s): INR, PROTIME in the last 168 hours. Cardiac Enzymes: No results for input(s): CKTOTAL, CKMB, CKMBINDEX, TROPONINI in the last 168 hours. BNP (last 3 results) No results for input(s): PROBNP in the last 8760 hours. HbA1C: No results for input(s): HGBA1C in the last 72 hours.  CBG: Recent Labs  Lab 03/21/21 0655 03/21/21 1118 03/21/21 1631 03/21/21 2039 03/22/21 0616   GLUCAP 96 135* 117* 153* 105*    Lipid Profile: No results for input(s): CHOL, HDL, LDLCALC, TRIG, CHOLHDL, LDLDIRECT in the last 72 hours. Thyroid Function Tests: No results for input(s): TSH, T4TOTAL, FREET4, T3FREE, THYROIDAB in the last 72 hours. Anemia Panel: No results for input(s): VITAMINB12, FOLATE, FERRITIN, TIBC, IRON, RETICCTPCT in the last 72 hours. Sepsis Labs: No results for input(s): PROCALCITON, LATICACIDVEN  in the last 168 hours.   Recent Results (from the past 240 hour(s))  Resp Panel by RT-PCR (Flu A&B, Covid) Nasopharyngeal Swab     Status: None   Collection Time: 03/15/21  4:52 AM   Specimen: Nasopharyngeal Swab; Nasopharyngeal(NP) swabs in vial transport medium  Result Value Ref Range Status   SARS Coronavirus 2 by RT PCR NEGATIVE NEGATIVE Final    Comment: (NOTE) SARS-CoV-2 target nucleic acids are NOT DETECTED.  The SARS-CoV-2 RNA is generally detectable in upper respiratory specimens during the acute phase of infection. The lowest concentration of SARS-CoV-2 viral copies this assay can detect is 138 copies/mL. A negative result does not preclude SARS-Cov-2 infection and should not be used as the sole basis for treatment or other patient management decisions. A negative result may occur with  improper specimen collection/handling, submission of specimen other than nasopharyngeal swab, presence of viral mutation(s) within the areas targeted by this assay, and inadequate number of viral copies(<138 copies/mL). A negative result must be combined with clinical observations, patient history, and epidemiological information. The expected result is Negative.  Fact Sheet for Patients:  EntrepreneurPulse.com.au  Fact Sheet for Healthcare Providers:  IncredibleEmployment.be  This test is no t yet approved or cleared by the [place] FDA and  has been authorized for detection and/or diagnosis of SARS-CoV-2 by FDA under  an Emergency Use Authorization (EUA). This EUA will remain  in effect (meaning this test can be used) for the duration of the COVID-19 declaration under Section 564(b)(1) of the Act, 21 U.S.C.section 360bbb-3(b)(1), unless the authorization is terminated  or revoked sooner.       Influenza A by PCR NEGATIVE NEGATIVE Final   Influenza B by PCR NEGATIVE NEGATIVE Final    Comment: (NOTE) The Xpert Xpress SARS-CoV-2/FLU/RSV plus assay is intended as an aid in the diagnosis of influenza from Nasopharyngeal swab specimens and should not be used as a sole basis for treatment. Nasal washings and aspirates are unacceptable for Xpert Xpress SARS-CoV-2/FLU/RSV testing.  Fact Sheet for Patients: EntrepreneurPulse.com.au  Fact Sheet for Healthcare Providers: IncredibleEmployment.be  This test is not yet approved or cleared by the [place] FDA and has been authorized for detection and/or diagnosis of SARS-CoV-2 by FDA under an Emergency Use Authorization (EUA). This EUA will remain in effect (meaning this test can be used) for the duration of the COVID-19 declaration under Section 564(b)(1) of the Act, 21 U.S.C. section 360bbb-3(b)(1), unless the authorization is terminated or revoked.  Performed at [facility], [place] [address] [place], [place] [address]   Blood culture (routine x 2)     Status: None   Collection Time: 03/15/21  6:24 AM   Specimen: BLOOD  Result Value Ref Range Status   Specimen Description BLOOD BLOOD RIGHT FOREARM  Final   Special Requests   Final    BOTTLES DRAWN AEROBIC AND ANAEROBIC Blood Culture adequate volume   Culture   Final    NO GROWTH 5 DAYS Performed at [facility], [place] [address] [place], [place] [address]    Report Status 03/20/2021 FINAL  Final  MRSA Next Gen by PCR, Nasal     Status: None   Collection Time: 03/15/21  8:25 AM   Specimen: Nasal Mucosa; Nasal Swab  Result Value Ref Range Status    MRSA by PCR Next Gen NOT DETECTED NOT DETECTED Final    Comment: (NOTE) The GeneXpert MRSA Assay (FDA approved for NASAL specimens only), is one component of  a comprehensive MRSA colonization surveillance program. It is not intended to diagnose MRSA infection nor to guide or monitor treatment for MRSA infections. Test performance is not FDA approved in patients less than 74 years old. Performed at [facility], [place] [address], [place], [place] [address]   Blood culture (routine x 2)     Status: None   Collection Time: 03/16/21  6:42 AM   Specimen: BLOOD RIGHT HAND  Result Value Ref Range Status   Specimen Description BLOOD RIGHT HAND  Final   Special Requests   Final    BOTTLES DRAWN AEROBIC ONLY Blood Culture adequate volume   Culture   Final    NO GROWTH 5 DAYS Performed at [facility], [place] [address], [place], [place] [address]    Report Status 03/21/2021 FINAL  Final  Gastrointestinal Panel by PCR , Stool     Status: None   Collection Time: 03/17/21  2:46 PM   Specimen: Stool  Result Value Ref Range Status   Campylobacter species NOT DETECTED NOT DETECTED Final   Plesimonas shigelloides NOT DETECTED NOT DETECTED Final   Salmonella species NOT DETECTED NOT DETECTED Final   Yersinia enterocolitica NOT DETECTED NOT DETECTED Final   Vibrio species NOT DETECTED NOT DETECTED Final   Vibrio cholerae NOT DETECTED NOT DETECTED Final   Enteroaggregative E coli (EAEC) NOT DETECTED NOT DETECTED Final   Enteropathogenic E coli (EPEC) NOT DETECTED NOT DETECTED Final   Enterotoxigenic E coli (ETEC) NOT DETECTED NOT DETECTED Final   Shiga like toxin producing E coli (STEC) NOT DETECTED NOT DETECTED Final   Shigella/Enteroinvasive E coli (EIEC) NOT DETECTED NOT DETECTED Final   Cryptosporidium NOT DETECTED NOT DETECTED Final   Cyclospora cayetanensis NOT DETECTED NOT DETECTED Final   Entamoeba histolytica NOT DETECTED NOT DETECTED Final   Giardia lamblia NOT DETECTED NOT  DETECTED Final   Adenovirus F40/41 NOT DETECTED NOT DETECTED Final   Astrovirus NOT DETECTED NOT DETECTED Final   Norovirus GI/GII NOT DETECTED NOT DETECTED Final   Rotavirus A NOT DETECTED NOT DETECTED Final   Sapovirus (I, II, IV, and V) NOT DETECTED NOT DETECTED Final    Comment: Performed at [facility], [address] [place], [place] [address]   Radiology Studies: DG Chest Port 1 View  Result Date: 03/21/2021 CLINICAL DATA:  Shortness of breath EXAM: PORTABLE CHEST 1 VIEW COMPARISON:  CT chest dated March 17, 2021 FINDINGS: The heart size and mediastinal contours are within normal limits. Vascular stent projecting over the left upper arm. Bilateral upper lobe predominant fibrosis and cystic changes. No evidence of acute pneumonia or pleural effusion. No pneumothorax. Advanced bilateral glenohumeral osteoarthritis. IMPRESSION: Chronic scarring/cystic changes of bilateral upper lobes without evidence of acute pulmonary process. Electronically Signed   By: Keane Police D.O.   On: 03/21/2021 11:05     LOS: 7 days   Antonieta Pert, MD [facility]  03/22/2021, 7:31 AM   Discharge delayed yesterday secondary to patient wishes to stay another night   DOA: 03/15/2021 Discharge March 22, 2021

## 2021-03-23 LAB — GLUCOSE, CAPILLARY
Glucose-Capillary: 103 mg/dL — ABNORMAL HIGH (ref 70–99)
Glucose-Capillary: 108 mg/dL — ABNORMAL HIGH (ref 70–99)
Glucose-Capillary: 133 mg/dL — ABNORMAL HIGH (ref 70–99)
Glucose-Capillary: 142 mg/dL — ABNORMAL HIGH (ref 70–99)

## 2021-03-23 NOTE — Progress Notes (Signed)
Came to assist primary nurse as a SWOT. Picked up patient medication dispensed from [facility]. Brought the medication into the room. Patient was not agreeable to $18.79 charge. She requested the charge to be reversed. Medication taken back to pharmacy and consulted with pharmacist. Per pharmacist the medication and charges were done by [facility] which is closed over the weekend. Pharmacist at the main pharmacy unable to access and separate the charges or medication. All medication left the pharmacy per patient request.

## 2021-03-23 NOTE — Progress Notes (Signed)
[facility] Progress Note   Subjective:    Seen and examined patient at bedside. Appears to have more energy today. Denies SOB and CP. Tolerated HD overnight with net UF 1.7L. Plan for discharge today.  Objective Vitals:   03/22/21 2302 03/22/21 2332 03/23/21 0031 03/23/21 0938  BP: 115/85  105/66 121/73  Pulse: 73  66 61  Resp: (!) 22  18 18   Temp:  98 F (36.7 C) 98.2 F (36.8 C) 97.9 F (36.6 C)  TempSrc:  Oral Oral   SpO2: 97%  100% 99%  Weight:  72.5 kg    Height:       Physical Exam General: Elderly woman; lying in bed; NAD Heart: Normal S1 and S2; No murmurs, gallops, or rubs Lungs: Clear throughout; No wheezing, rales, or rhonchi Abdomen: Soft and non-tender Extremities: No edema BLLE Dialysis Access: L AVF (+) Bruit/Thrill   Filed Weights   03/20/21 1140 03/22/21 1945 03/22/21 2332  Weight: 72 kg 74.5 kg 72.5 kg    Intake/Output Summary (Last 24 hours) at 03/23/2021 1624 Last data filed at 03/23/2021 1250 Gross per 24 hour  Intake 737 ml  Output 1720 ml  Net -983 ml    Additional Objective Labs: Basic Metabolic Panel: Recent Labs  Lab 03/18/21 1020 03/19/21 0324 03/20/21 0830  NA 134* 135 132*  K 4.4 4.1 3.8  CL 94* 95* 92*  CO2 22 26 23   GLUCOSE 95 108* 118*  BUN 58* 31* 47*  CREATININE 11.58* 7.46* 9.97*  CALCIUM 8.8* 8.7* 9.1  PHOS 7.1*  --  6.7*   Liver Function Tests: Recent Labs  Lab 03/18/21 1020 03/20/21 0830  ALBUMIN 3.2* 3.3*   No results for input(s): LIPASE, AMYLASE in the last 168 hours. CBC: Recent Labs  Lab 03/18/21 1020 03/20/21 0830  WBC 6.8 6.2  HGB 11.4* 11.7*  HCT 34.2* 37.0  MCV 89.1 91.4  PLT 269 280   Blood Culture    Component Value Date/Time   SDES BLOOD RIGHT HAND 03/16/2021 0642   SPECREQUEST  03/16/2021 0642    BOTTLES DRAWN AEROBIC ONLY Blood Culture adequate volume   CULT  03/16/2021 0642    NO GROWTH 5 DAYS Performed at [facility], [address] [place], [place]  [id number]    REPTSTATUS 03/21/2021 FINAL 03/16/2021 3710    Cardiac Enzymes: No results for input(s): CKTOTAL, CKMB, CKMBINDEX, TROPONINI in the last 168 hours. CBG: Recent Labs  Lab 03/22/21 1555 03/22/21 1625 03/23/21 0027 03/23/21 0750 03/23/21 1137  GLUCAP 130* 130* 142* 133* 103*   Iron Studies: No results for input(s): IRON, TIBC, TRANSFERRIN, FERRITIN in the last 72 hours. Lab Results  Component Value Date   INR 1.3 (H) 06/08/2019   INR 1.2 08/24/2018   INR 1.1 08/21/2018   Studies/Results: No results found.  Medications:  lidocaine-prilocaine      acidophilus  2 capsule Oral Daily   amLODipine  10 mg Oral Daily   atorvastatin  10 mg Oral QPM   calcitRIOL  1.5 mcg Oral Q M,W,F-HD   Chlorhexidine Gluconate Cloth  6 each Topical Q0600   docusate sodium  100 mg Oral BID   escitalopram  10 mg Oral Daily   ferric citrate  420 mg Oral TID WC   heparin  5,000 Units Subcutaneous Q8H   insulin aspart  0-6 Units Subcutaneous TID WC   labetalol  200 mg Oral BID   lidocaine  1 patch Transdermal Q24H   losartan  50 mg Oral Daily   multivitamin  1 tablet Oral QHS   sodium chloride flush  3 mL Intravenous Q12H    Dialysis Orders: MWF [facility] 3h  75kg  2/2 bath  LUA AVG  Hep 2000 - calcitriol 1.5 ug tiw - mircera 200 q2, last 12/28  Assessment/Plan: AMS -multifactorial. Missed HD PTA and ESRD pt recently prescribed Valtrex for shingles (son unaware if renally dosed) and gabapentin(also not sure of dosing) = COMMON  Cause of AMS when given to ESRD pt's at the non-ESRD dose.  Has improved some since dialysis,  Valtrex takes at least 2-3 days to wash out  sometimes longer.  Neurontin effects are not as long. Also meds plus infectious process from pneumonia in ESRD patient was seen by psych for agitation meds. MS Improving. Appears back to baseline.  Possible PNA - completed IV abx course, per pmd.  ESRD - on HD MWF.  Back on schedule. Tolerated HD overnight with net UF 1.7L. Plan  to resume HD on Monday 1/23 in outpatient.   BP/ volume - BP better. Back on amlodipine, labetalol, hydralazine +losartan  Not vol overloaded on exam. CXR w/ LLL changes, scarring upper lobes.  Does not tolerate large UF goals. UF as able. Lower EDW at discharge.  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 Spoke to son yesterday who are now agreeable to outpatient PT services.  Tobie Poet, NP [facility] 03/23/2021,4:24 PM  LOS: 8 days

## 2021-03-23 NOTE — Care Management (Signed)
°  Transition of Care [facility]) Screening Note   Patient Details  Name: Bianca Myers Date of Birth: [birth year]   Transition of Care [facility]) CM/SW Contact:    Bethena Roys, RN Phone Number: 03/23/2021, 12:47 PM    Transition of Care Department [facility]) has reviewed the patient. Case Manager called and spoke with son Brenton Grills and patient. Both are agreeable to outpatient PT services. Oval Linsey has reached out to the patient for visit times. Son had questions regarding oral antibiotics for home- a secure message submitted to the provider. Son will provide transportation home via private vehicle. No further needs from Case Manager

## 2021-03-23 NOTE — Progress Notes (Signed)
Patient son requested discharge medication to be sent to [facility] in [place]. MD made aware via secure chat.

## 2021-03-23 NOTE — Progress Notes (Signed)
Losartan and calcitriol medication sent to [facility] at  [place] per patient son`s request.She declines lidocaine and already has norvasc at home.

## 2021-03-23 NOTE — Plan of Care (Signed)
°  Problem: Activity: Goal: Ability to tolerate increased activity will improve Outcome: Progressing   Problem: Education: Goal: Knowledge of General Education information will improve Description: Including pain rating scale, medication(s)/side effects and non-pharmacologic comfort measures Outcome: Progressing   Problem: Health Behavior/Discharge Planning: Goal: Ability to manage health-related needs will improve Outcome: Progressing   Problem: Nutrition: Goal: Adequate nutrition will be maintained Outcome: Progressing   Problem: Coping: Goal: Level of anxiety will decrease Outcome: Progressing   Problem: Elimination: Goal: Will not experience complications related to bowel motility Outcome: Progressing   Problem: Safety: Goal: Ability to remain free from injury will improve Outcome: Progressing   Problem: Skin Integrity: Goal: Risk for impaired skin integrity will decrease Outcome: Progressing

## 2021-03-25 ENCOUNTER — Telehealth: Payer: Self-pay | Admitting: Nephrology

## 2021-03-25 ENCOUNTER — Other Ambulatory Visit (HOSPITAL_COMMUNITY): Payer: Self-pay

## 2021-03-25 NOTE — Telephone Encounter (Signed)
Transition of Care Contact from [place]   Date of Discharge: 03/22/2021  Date of Contact: 03/25/2020 Method of contact: phone Talked to patient   Patient contacted to discuss transition of care form recent hospitaliztion. Patient was admitted to [facility] from 03/15/21 to 03/22/21 with the discharge diagnosis of AMS, pneumonia, LUL density and posterior L 8th rib fracture.    Medication changes were reviewed.  Patient will follow up with is outpatient dialysis center 03/26/21.  Other follow up needs include none identified.    Jen Mow, PA-C [facility] Pager: [phone]

## 2022-01-09 DIAGNOSIS — I361 Nonrheumatic tricuspid (valve) insufficiency: Secondary | ICD-10-CM

## 2022-01-09 DIAGNOSIS — I340 Nonrheumatic mitral (valve) insufficiency: Secondary | ICD-10-CM | POA: Diagnosis not present

## 2022-01-18 ENCOUNTER — Emergency Department (HOSPITAL_COMMUNITY)
Admission: EM | Admit: 2022-01-18 | Discharge: 2022-01-18 | Disposition: A | Discharge status: Home / Self Care | Payer: Medicare HMO | Attending: Emergency Medicine | Admitting: Emergency Medicine

## 2022-01-18 ENCOUNTER — Other Ambulatory Visit: Payer: Self-pay

## 2022-01-18 ENCOUNTER — Encounter (HOSPITAL_COMMUNITY): Payer: Self-pay

## 2022-01-18 ENCOUNTER — Emergency Department (HOSPITAL_COMMUNITY): Payer: Medicare HMO

## 2022-01-18 DIAGNOSIS — R059 Cough, unspecified: Secondary | ICD-10-CM | POA: Diagnosis present

## 2022-01-18 DIAGNOSIS — N186 End stage renal disease: Secondary | ICD-10-CM | POA: Diagnosis not present

## 2022-01-18 DIAGNOSIS — E1122 Type 2 diabetes mellitus with diabetic chronic kidney disease: Secondary | ICD-10-CM | POA: Insufficient documentation

## 2022-01-18 DIAGNOSIS — J40 Bronchitis, not specified as acute or chronic: Secondary | ICD-10-CM

## 2022-01-18 DIAGNOSIS — Z87891 Personal history of nicotine dependence: Secondary | ICD-10-CM | POA: Diagnosis not present

## 2022-01-18 DIAGNOSIS — I120 Hypertensive chronic kidney disease with stage 5 chronic kidney disease or end stage renal disease: Secondary | ICD-10-CM | POA: Diagnosis not present

## 2022-01-18 DIAGNOSIS — Z79899 Other long term (current) drug therapy: Secondary | ICD-10-CM | POA: Insufficient documentation

## 2022-01-18 DIAGNOSIS — Z992 Dependence on renal dialysis: Secondary | ICD-10-CM | POA: Insufficient documentation

## 2022-01-18 LAB — BASIC METABOLIC PANEL
Anion gap: 16 — ABNORMAL HIGH (ref 5–15)
BUN: 24 mg/dL — ABNORMAL HIGH (ref 8–23)
CO2: 29 mmol/L (ref 22–32)
Calcium: 9.6 mg/dL (ref 8.9–10.3)
Chloride: 95 mmol/L — ABNORMAL LOW (ref 98–111)
Creatinine, Ser: 5.95 mg/dL — ABNORMAL HIGH (ref 0.44–1.00)
GFR, Estimated: 7 mL/min — ABNORMAL LOW (ref >60)
Glucose, Bld: 129 mg/dL — ABNORMAL HIGH (ref 70–99)
Potassium: 3.7 mmol/L (ref 3.5–5.1)
Sodium: 140 mmol/L (ref 135–145)

## 2022-01-18 LAB — CBC
HCT: 27.4 % — ABNORMAL LOW (ref 36.0–46.0)
Hemoglobin: 8.6 g/dL — ABNORMAL LOW (ref 12.0–15.0)
MCH: 28 pg (ref 26.0–34.0)
MCHC: 31.4 g/dL (ref 30.0–36.0)
MCV: 89.3 fL (ref 80.0–100.0)
Platelets: 272 10*3/uL (ref 150–400)
RBC: 3.07 MIL/uL — ABNORMAL LOW (ref 3.87–5.11)
RDW: 18.1 % — ABNORMAL HIGH (ref 11.5–15.5)
WBC: 9 10*3/uL (ref 4.0–10.5)
nRBC: 0 % (ref 0.0–0.2)

## 2022-01-18 LAB — TROPONIN I (HIGH SENSITIVITY)
Troponin I (High Sensitivity): 39 ng/L — ABNORMAL HIGH (ref <18)
Troponin I (High Sensitivity): 32 ng/L — ABNORMAL HIGH (ref <18)

## 2022-01-18 MED ORDER — PREDNISONE 10 MG PO TABS: 40.0000 mg | ORAL_TABLET | Freq: Every day | ORAL | 0 refills | Status: DC

## 2022-01-18 MED ORDER — ALBUTEROL SULFATE HFA 108 (90 BASE) MCG/ACT IN AERS: 1.0000 | INHALATION_SPRAY | Freq: Four times a day (QID) | RESPIRATORY_TRACT | 1 refills | Status: DC | PRN

## 2022-01-18 NOTE — ED Provider Notes (Signed)
[place] [facility] EMERGENCY DEPARTMENT Provider Note   CSN: [id number] Arrival date & time: 01/18/22  1055     History {Add pertinent medical, surgical, social history, OB history to HPI:1} No chief complaint on file.   Bianca Myers is a 76 y.o. female.  Patient with 1 month history of persistent cough and belching.  Patient is a dialysis patient normally dialyzed Monday Wednesdays and Fridays.  Was dialyzed on Friday had complete dialysis session.  Patient only rarely makes urine on her own.  Patient was admitted recently to [facility] in [place] discharged home a week ago.  They felt that she may have had pneumonia she was treated with Levaquin.  Patient states that the cough is not improving.  Patient also has a feeling of shortness of breath with it.  Does not really have any chest pain will have chest discomfort more across the top of the shoulders.  Also low-grade fever.  Pain will go into the upper back area.  It is not severe.  Temperature here was 99 point 4 repeat was 98.2.  Oxygen saturations all in the upper 90s.  Respiratory rate originally was around 24 but now getting 19.  Heart rates been around 72.  Persistent pulse ox monitoring is shown no signs of hypoxia.  Patient is not on a course of steroids patient is not currently on albuterol inhaler.  Past medical history significant for secondary hyperparathyroidism diabetes hypertension end-stage renal disease on dialysis patient states that her diabetes was just really secondary to steroids.  She is not chronically on diabetic meds.  Patient former smoker quit 1973.       Home Medications Prior to Admission medications   Medication Sig Start Date End Date Taking? Authorizing Provider  acetaminophen (TYLENOL) 500 MG tablet Take 500 mg by mouth every 6 (six) hours as needed for moderate pain.   Yes [provider]  albuterol (VENTOLIN HFA) 108 (90 Base) MCG/ACT inhaler Inhale 1-2 puffs into  the lungs every 4 (four) hours as needed for wheezing. 03/08/21  Yes [provider]  amLODipine (NORVASC) 5 MG tablet Take 5 mg by mouth daily.   Yes [provider]  atorvastatin (LIPITOR) 10 MG tablet Take 10 mg by mouth every evening.  05/14/18  Yes [provider]  BREZTRI AEROSPHERE 160-9-4.8 MCG/ACT AERO Inhale 2 puffs into the lungs 2 (two) times daily. 01/03/22  Yes [provider]  Cholecalciferol 100 MCG (4000 UT) CAPS Take 4,000 Units by mouth daily.   Yes [provider]  diclofenac Sodium (VOLTAREN) 1 % GEL Apply 2 g topically daily as needed (pain). 05/08/21  Yes [provider]  escitalopram (LEXAPRO) 10 MG tablet Take 10 mg by mouth daily. 05/09/19  Yes [provider]  ferric citrate (AURYXIA) 1 GM 210 MG(Fe) tablet Take 420 mg by mouth 3 (three) times daily with meals.   Yes [provider]  labetalol (NORMODYNE) 200 MG tablet Take 200 mg by mouth 2 (two) times daily. 03/01/21  Yes [provider]  levofloxacin (LEVAQUIN) 250 MG tablet Take 250 mg by mouth See admin instructions. Takes 500 mg on dialysis days ( Monday,Wednesday and Friday) 01/11/22 01/19/22 Yes [provider]  loperamide (IMODIUM) 2 MG capsule Take 1 capsule (2 mg total) by mouth as needed for diarrhea or loose stools. 03/21/21  Yes Antonieta Pert, MD  losartan (COZAAR) 50 MG tablet Take 1 tablet (50 mg total) by mouth daily. 03/22/21 01/18/22 Yes Antonieta Pert, MD  multivitamin (RENA-VIT) TABS tablet Take 1 tablet by mouth at bedtime. 08/27/18  Yes Dhungel, Nishant, MD  ondansetron (ZOFRAN-ODT) 4 MG disintegrating tablet Take 4 mg by mouth every 8 (eight) hours as needed for nausea or vomiting. 12/06/21  Yes [provider]  polyethylene glycol (MIRALAX) 17 g packet Take 17 g by mouth daily as needed for moderate constipation. Patient taking differently: Take 17 g by mouth daily as needed for mild constipation or moderate constipation.  08/27/18  Yes Dhungel, Nishant, MD  amLODipine (NORVASC) 10 MG tablet Take 1 tablet (10 mg total) by mouth daily. Patient not taking: Reported on 01/18/2022 03/22/21 04/21/21  Antonieta Pert, MD  amoxicillin (AMOXIL) 500 MG tablet Take 2g 1 hour prior to dental procedure Patient not taking: Reported on 01/18/2022 03/19/20   Leandrew Koyanagi, MD  Ascorbic Acid (VITAMIN C) 1000 MG tablet Take 1,000 mg by mouth 2 (two) times a day.  Patient not taking: Reported on 01/18/2022    [provider]      Allergies    Pepcid [famotidine]    Review of Systems   Review of Systems  Constitutional:  Negative for chills and fever.  HENT:  Negative for rhinorrhea and sore throat.   Eyes:  Negative for visual disturbance.  Respiratory:  Positive for cough and shortness of breath.   Cardiovascular:  Positive for chest pain. Negative for leg swelling.  Gastrointestinal:  Negative for abdominal pain, diarrhea, nausea and vomiting.  Genitourinary:  Negative for dysuria.  Musculoskeletal:  Negative for back pain and neck pain.  Skin:  Negative for rash.  Neurological:  Negative for dizziness, light-headedness and headaches.  Hematological:  Does not bruise/bleed easily.  Psychiatric/Behavioral:  Negative for confusion.     Physical Exam Updated Vital Signs BP (!) 163/80   Pulse 71   Temp 99.1 F (37.3 C) (Oral)   Resp 20   Ht 1.702 m ('5\' 7"'$ )   Wt 63.5 kg   SpO2 97%   BMI 21.93 kg/m  Physical Exam Vitals and nursing note reviewed.  Constitutional:      General: She is not in acute distress.    Appearance: Normal appearance. She is well-developed.  HENT:     Head: Normocephalic and atraumatic.  Eyes:     Extraocular Movements: Extraocular movements intact.     Conjunctiva/sclera: Conjunctivae normal.     Pupils: Pupils are equal, round, and reactive to light.  Cardiovascular:     Rate and Rhythm: Normal rate and regular rhythm.     Heart sounds: No murmur heard. Pulmonary:     Effort:  Pulmonary effort is normal. No respiratory distress.     Breath sounds: No stridor. Rhonchi and rales present. No wheezing.  Chest:     Chest wall: No tenderness.  Abdominal:     Palpations: Abdomen is soft.     Tenderness: There is no abdominal tenderness.  Musculoskeletal:        General: No swelling.     Cervical back: Neck supple.     Right lower leg: No edema.     Left lower leg: No edema.  Skin:    General: Skin is warm and dry.     Capillary Refill: Capillary refill takes less than 2 seconds.  Neurological:     General: No focal deficit present.     Mental Status: She is alert.  Psychiatric:        Mood and Affect: Mood normal.     ED Results /  Procedures / Treatments   Labs (all labs ordered are listed, but only abnormal results are displayed) Labs Reviewed  BASIC METABOLIC PANEL - Abnormal; Notable for the following components:      Result Value   Chloride 95 (*)    Glucose, Bld 129 (*)    BUN 24 (*)    Creatinine, Ser 5.95 (*)    GFR, Estimated 7 (*)    Anion gap 16 (*)    All other components within normal limits  CBC - Abnormal; Notable for the following components:   RBC 3.07 (*)    Hemoglobin 8.6 (*)    HCT 27.4 (*)    RDW 18.1 (*)    All other components within normal limits  TROPONIN I (HIGH SENSITIVITY) - Abnormal; Notable for the following components:   Troponin I (High Sensitivity) 39 (*)    All other components within normal limits  TROPONIN I (HIGH SENSITIVITY) - Abnormal; Notable for the following components:   Troponin I (High Sensitivity) 32 (*)    All other components within normal limits    EKG EKG Interpretation  Date/Time:  Saturday January 18 2022 11:08:31 EST Ventricular Rate:  72 PR Interval:  160 QRS Duration: 100 QT Interval:  448 QTC Calculation: 490 R Axis:   57 Text Interpretation: Sinus rhythm with occasional Premature ventricular complexes and Premature atrial complexes Minimal voltage criteria for LVH, may be normal  variant ( Sokolow-Lyon ) Nonspecific T wave abnormality Prolonged QT Abnormal ECG When compared with ECG of 15-Mar-2021 06:23, PREVIOUS ECG IS PRESENT No significant change since last tracing Except for PVC's Confirmed by Fredia Sorrow [phone]) on 01/18/2022 12:56:22 PM  Radiology DG Chest 2 View  Result Date: 01/18/2022 CLINICAL DATA:  Chest pain EXAM: CHEST - 2 VIEW COMPARISON:  01/08/22 CXR and CT FINDINGS: No pleural effusion. No pneumothorax. Cardiomegaly, similar to prior exam. Persistent linear opacities at the bilateral lung apices, unchanged from prior exam, and likely scarring. No new focal airspace opacity is visualized. There are persistent prominent interstitial opacities, likely related to patient's underlying interstitial lung disease. Vascular stent in the left axilla, which may be narrowed at the proximal aspect, new compared to 01/09/2019 IMPRESSION: 1. No new radiographic finding to explain chest pain 2. Cardiomegaly 3. Redemonstrated findings of fibrotic lung disease. No new airspace opacity visualized. No pneumothorax or pleural effusion. 4. Left axillary stent in place with possible new narrowing at the proximal aspect of the stent. Electronically Signed   By: Marin Roberts M.D.   On: 01/18/2022 11:52    Procedures Procedures  {Document cardiac monitor, telemetry assessment procedure when appropriate:1}  Medications Ordered in ED Medications - No data to display  ED Course/ Medical Decision Making/ A&P                           Medical Decision Making Amount and/or Complexity of Data Reviewed Labs: ordered. Radiology: ordered.   Patient's chest x-ray here without any acute findings.  No evidence of pulmonary edema there is evidence of fibrotic lung disease no new airspace opacities visualized no pneumothorax or pleural effusion.  There is a left axillary stent in place possible narrowing at the proximal aspect of the stent.  Patient's EKG without any acute findings.   Patient's initial troponin was 39 CBC white count 9 hemoglobin 8.6 platelets 734 basic metabolic panel important that potassium was 3.7.  Patient is a dialysis patient and patient's delta troponin was 32.  So no significant change in troponins.  Patient very concerned that something is not right since this been going on for a month.  We will go ahead and get CT chest without contrast just to further evaluate rule out any subtle pneumonia.  Patient is still on Levaquin.  Also rule out any subtle pulmonary edema.  If no evidence of any acute findings on CT chest not concerned about pulmonary embolus.  Then patient stable for discharge home we will do a trial of albuterol inhaler and a trial of prednisone.   Final Clinical Impression(s) / ED Diagnoses Final diagnoses:  None    Rx / DC Orders ED Discharge Orders     None

## 2022-01-18 NOTE — ED Triage Notes (Addendum)
Patient complains of chest pain x 1 week with cough, low grade fever and pain radiating to upper back. Patient states that she is unable to sleep due to the discomfort. Alert and oriented. Also report cough worse at night with headache and burping

## 2022-01-18 NOTE — Discharge Instructions (Addendum)
Take the prednisone as directed for the next 5 days.  Take the albuterol 2 puffs every 6 hours for the next 7 days then as needed.  Make an appointment follow-up with your doctor.  Turn for any new or worse symptoms.  CT scan without any acute findings.  This shows the pulmonary fibrosis.

## 2022-03-03 DIAGNOSIS — Z933 Colostomy status: Secondary | ICD-10-CM

## 2022-03-03 DIAGNOSIS — K631 Perforation of intestine (nontraumatic): Secondary | ICD-10-CM

## 2022-03-03 HISTORY — DX: Colostomy status: Z93.3

## 2022-03-03 HISTORY — DX: Perforation of intestine (nontraumatic): K63.1

## 2022-03-08 DIAGNOSIS — I4819 Other persistent atrial fibrillation: Secondary | ICD-10-CM | POA: Diagnosis not present

## 2022-03-10 DIAGNOSIS — I4819 Other persistent atrial fibrillation: Secondary | ICD-10-CM | POA: Diagnosis not present

## 2022-03-10 DIAGNOSIS — I1311 Hypertensive heart and chronic kidney disease without heart failure, with stage 5 chronic kidney disease, or end stage renal disease: Secondary | ICD-10-CM | POA: Diagnosis not present

## 2022-03-10 DIAGNOSIS — N189 Chronic kidney disease, unspecified: Secondary | ICD-10-CM | POA: Diagnosis not present

## 2022-03-11 DIAGNOSIS — N189 Chronic kidney disease, unspecified: Secondary | ICD-10-CM | POA: Diagnosis not present

## 2022-03-11 DIAGNOSIS — I1311 Hypertensive heart and chronic kidney disease without heart failure, with stage 5 chronic kidney disease, or end stage renal disease: Secondary | ICD-10-CM | POA: Diagnosis not present

## 2022-03-11 DIAGNOSIS — D631 Anemia in chronic kidney disease: Secondary | ICD-10-CM | POA: Diagnosis not present

## 2022-03-11 DIAGNOSIS — I350 Nonrheumatic aortic (valve) stenosis: Secondary | ICD-10-CM | POA: Diagnosis not present

## 2022-03-11 DIAGNOSIS — I4819 Other persistent atrial fibrillation: Secondary | ICD-10-CM | POA: Diagnosis not present

## 2022-08-12 DIAGNOSIS — I350 Nonrheumatic aortic (valve) stenosis: Secondary | ICD-10-CM

## 2022-08-12 DIAGNOSIS — I272 Pulmonary hypertension, unspecified: Secondary | ICD-10-CM

## 2022-08-12 DIAGNOSIS — I340 Nonrheumatic mitral (valve) insufficiency: Secondary | ICD-10-CM

## 2022-08-12 DIAGNOSIS — I361 Nonrheumatic tricuspid (valve) insufficiency: Secondary | ICD-10-CM

## 2022-08-21 ENCOUNTER — Encounter: Payer: Self-pay | Admitting: Internal Medicine

## 2022-10-24 ENCOUNTER — Encounter: Payer: Self-pay | Admitting: Vascular Surgery

## 2022-10-24 ENCOUNTER — Ambulatory Visit: Payer: Medicare HMO | Admitting: Vascular Surgery

## 2022-10-24 ENCOUNTER — Other Ambulatory Visit: Payer: Self-pay

## 2022-10-24 VITALS — BP 165/85 | HR 62 | Resp 16 | Ht 67.0 in | Wt 140.0 lb

## 2022-10-24 DIAGNOSIS — N186 End stage renal disease: Secondary | ICD-10-CM

## 2022-10-24 DIAGNOSIS — Z992 Dependence on renal dialysis: Secondary | ICD-10-CM

## 2022-10-24 NOTE — Progress Notes (Signed)
ASSESSMENT & PLAN   END-STAGE RENAL DISEASE: This patient has an area overlying her left upper arm graft which is at risk for bleeding.  I have recommended that we excise this area and closed the tissue over the graft.  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  She just got off dialysis today.  She dialyzes Monday Wednesdays and Fridays.  I have scheduled this for Tuesday, 10/28/2022.  She has been instructed on what to do if this does bleed.  Her son was also present.  REASON FOR CONSULT:    Ulceration overlying AV fistula.  The consult is requested by Dr. Estill Bakes.   HPI:   Bianca Myers is a 77 y.o. female who has had a left arm  for 3.5 - 4 years.  She had a left basilic vein transposition in September 2020.  Most recently the fistula was ligated and she had a left upper arm graft placed on 07/12/2019.  She developed a lump over the graft which has gradually gotten smaller.  However what remains is an elevated area at high risk for bleeding.  The graft is otherwise working well.  She was referred for evaluation.  She has not had any bleeding problems from this yet.  Past Medical History:  Diagnosis Date   Anemia    Arthritis    Depression    Diabetes mellitus (HCC)    Dyslipidemia 03/15/2021   ESRD (end stage renal disease) on dialysis [facility])    Dialysis M/W/F   Gout    History of anemia due to chronic kidney disease    History of degenerative disc disease    Hypertension    Pneumonia    Refusal of blood transfusions as patient is Jehovah's Witness    Secondary hyperparathyroidism (HCC)    Spondylisthesis     Family History  Family history unknown: Yes    SOCIAL HISTORY: Social History   Tobacco Use   Smoking status: Former    Current packs/day: 0.00    Types: Cigarettes    Quit date: 1973    Years since quitting: 51.6   Smokeless tobacco: Never  Substance Use Topics   Alcohol use: Not Currently    Allergies   Allergen Reactions   Pepcid [Famotidine] Other (See Comments)    Mental status changes(Intolerance)    Current Outpatient Medications  Medication Sig Dispense Refill   acetaminophen (TYLENOL) 500 MG tablet Take 500 mg by mouth every 6 (six) hours as needed for moderate pain.     albuterol (VENTOLIN HFA) 108 (90 Base) MCG/ACT inhaler Inhale 1-2 puffs into the lungs every 4 (four) hours as needed for wheezing.     amoxicillin (AMOXIL) 500 MG tablet Take 2g 1 hour prior to dental procedure 10 tablet 0   Ascorbic Acid (VITAMIN C) 1000 MG tablet Take 1,000 mg by mouth 2 (two) times a day.     atorvastatin (LIPITOR) 10 MG tablet Take 10 mg by mouth every evening.      BREZTRI AEROSPHERE 160-9-4.8 MCG/ACT AERO Inhale 2 puffs into the lungs 2 (two) times daily.     Cholecalciferol 100 MCG (4000 UT) CAPS Take 4,000 Units by mouth daily.     diclofenac Sodium (VOLTAREN) 1 % GEL Apply 2 g topically daily as needed (pain).     escitalopram (LEXAPRO) 10 MG tablet Take 10 mg by mouth daily.     ferric citrate (AURYXIA) 1 GM 210 MG(Fe) tablet  Take 420 mg by mouth 3 (three) times daily with meals.     labetalol (NORMODYNE) 200 MG tablet Take 200 mg by mouth 2 (two) times daily.     loperamide (IMODIUM) 2 MG capsule Take 1 capsule (2 mg total) by mouth as needed for diarrhea or loose stools. 30 capsule 0   multivitamin (RENA-VIT) TABS tablet Take 1 tablet by mouth at bedtime. 30 tablet 0   ondansetron (ZOFRAN-ODT) 4 MG disintegrating tablet Take 4 mg by mouth every 8 (eight) hours as needed for nausea or vomiting.     polyethylene glycol (MIRALAX) 17 g packet Take 17 g by mouth daily as needed for moderate constipation. (Patient taking differently: Take 17 g by mouth daily as needed for mild constipation or moderate constipation.) 14 each 0   predniSONE (DELTASONE) 10 MG tablet Take 4 tablets (40 mg total) by mouth daily. 20 tablet 0   amLODipine (NORVASC) 10 MG tablet Take 1 tablet (10 mg total) by mouth  daily. (Patient not taking: Reported on 01/18/2022) 30 tablet 0   losartan (COZAAR) 50 MG tablet Take 1 tablet (50 mg total) by mouth daily. 30 tablet 0   No current facility-administered medications for this visit.    REVIEW OF SYSTEMS:  [X]  denotes positive finding, [ ]  denotes negative finding Cardiac  Comments:  Chest pain or chest pressure:    Shortness of breath upon exertion:    Short of breath when lying flat:    Irregular heart rhythm:        Vascular    Pain in calf, thigh, or hip brought on by ambulation:    Pain in feet at night that wakes you up from your sleep:     Blood clot in your veins:    Leg swelling:         Pulmonary    Oxygen at home:    Productive cough:     Wheezing:         Neurologic    Sudden weakness in arms or legs:     Sudden numbness in arms or legs:     Sudden onset of difficulty speaking or slurred speech:    Temporary loss of vision in one eye:     Problems with dizziness:         Gastrointestinal    Blood in stool:     Vomited blood:         Genitourinary    Burning when urinating:     Blood in urine:        Psychiatric    Major depression:         Hematologic    Bleeding problems:    Problems with blood clotting too easily:        Skin    Rashes or ulcers:        Constitutional    Fever or chills:    -  PHYSICAL EXAM:   Vitals:   10/24/22 1404  BP: (!) 165/85  Pulse: 62  Resp: 16  SpO2: 96%  Weight: 140 lb (63.5 kg)  Height: 5\' 7"  (1.702 m)   Body mass index is 21.93 kg/m. GENERAL: The patient is a well-nourished female, in no acute distress. The vital signs are documented above. CARDIAC: There is a regular rate and rhythm.  VASCULAR: She has a palpable radial pulse bilaterally. The graft has a good thrill. She has a small raised area overlying her graft as documented in the photographs below.  PULMONARY: There is good air exchange bilaterally without wheezing or rales. ABDOMEN: Soft and non-tender  with normal pitched bowel sounds.  MUSCULOSKELETAL: There are no major deformities. NEUROLOGIC: No focal weakness or paresthesias are detected. SKIN: There are no ulcers or rashes noted. PSYCHIATRIC: The patient has a normal affect.  DATA:    No new data.  [name] [facility]

## 2022-10-24 NOTE — H&P (View-Only) (Signed)
ASSESSMENT & PLAN   END-STAGE RENAL DISEASE: This patient has an area overlying her left upper arm graft which is at risk for bleeding.  I have recommended that we excise this area and closed the tissue over the graft.  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  She just got off dialysis today.  She dialyzes Monday Wednesdays and Fridays.  I have scheduled this for Tuesday, 10/28/2022.  She has been instructed on what to do if this does bleed.  Her son was also present.  REASON FOR CONSULT:    Ulceration overlying AV fistula.  The consult is requested by Dr. Estill Bakes.   HPI:   Bianca Myers is a 77 y.o. female who has had a left arm  for 3.5 - 4 years.  She had a left basilic vein transposition in September 2020.  Most recently the fistula was ligated and she had a left upper arm graft placed on 07/12/2019.  She developed a lump over the graft which has gradually gotten smaller.  However what remains is an elevated area at high risk for bleeding.  The graft is otherwise working well.  She was referred for evaluation.  She has not had any bleeding problems from this yet.  Past Medical History:  Diagnosis Date   Anemia    Arthritis    Depression    Diabetes mellitus (HCC)    Dyslipidemia 03/15/2021   ESRD (end stage renal disease) on dialysis [facility])    Dialysis M/W/F   Gout    History of anemia due to chronic kidney disease    History of degenerative disc disease    Hypertension    Pneumonia    Refusal of blood transfusions as patient is Jehovah's Witness    Secondary hyperparathyroidism (HCC)    Spondylisthesis     Family History  Family history unknown: Yes    SOCIAL HISTORY: Social History   Tobacco Use   Smoking status: Former    Current packs/day: 0.00    Types: Cigarettes    Quit date: 1973    Years since quitting: 51.6   Smokeless tobacco: Never  Substance Use Topics   Alcohol use: Not Currently    Allergies   Allergen Reactions   Pepcid [Famotidine] Other (See Comments)    Mental status changes(Intolerance)    Current Outpatient Medications  Medication Sig Dispense Refill   acetaminophen (TYLENOL) 500 MG tablet Take 500 mg by mouth every 6 (six) hours as needed for moderate pain.     albuterol (VENTOLIN HFA) 108 (90 Base) MCG/ACT inhaler Inhale 1-2 puffs into the lungs every 4 (four) hours as needed for wheezing.     amoxicillin (AMOXIL) 500 MG tablet Take 2g 1 hour prior to dental procedure 10 tablet 0   Ascorbic Acid (VITAMIN C) 1000 MG tablet Take 1,000 mg by mouth 2 (two) times a day.     atorvastatin (LIPITOR) 10 MG tablet Take 10 mg by mouth every evening.      BREZTRI AEROSPHERE 160-9-4.8 MCG/ACT AERO Inhale 2 puffs into the lungs 2 (two) times daily.     Cholecalciferol 100 MCG (4000 UT) CAPS Take 4,000 Units by mouth daily.     diclofenac Sodium (VOLTAREN) 1 % GEL Apply 2 g topically daily as needed (pain).     escitalopram (LEXAPRO) 10 MG tablet Take 10 mg by mouth daily.     ferric citrate (AURYXIA) 1 GM 210 MG(Fe) tablet  Take 420 mg by mouth 3 (three) times daily with meals.     labetalol (NORMODYNE) 200 MG tablet Take 200 mg by mouth 2 (two) times daily.     loperamide (IMODIUM) 2 MG capsule Take 1 capsule (2 mg total) by mouth as needed for diarrhea or loose stools. 30 capsule 0   multivitamin (RENA-VIT) TABS tablet Take 1 tablet by mouth at bedtime. 30 tablet 0   ondansetron (ZOFRAN-ODT) 4 MG disintegrating tablet Take 4 mg by mouth every 8 (eight) hours as needed for nausea or vomiting.     polyethylene glycol (MIRALAX) 17 g packet Take 17 g by mouth daily as needed for moderate constipation. (Patient taking differently: Take 17 g by mouth daily as needed for mild constipation or moderate constipation.) 14 each 0   predniSONE (DELTASONE) 10 MG tablet Take 4 tablets (40 mg total) by mouth daily. 20 tablet 0   amLODipine (NORVASC) 10 MG tablet Take 1 tablet (10 mg total) by mouth  daily. (Patient not taking: Reported on 01/18/2022) 30 tablet 0   losartan (COZAAR) 50 MG tablet Take 1 tablet (50 mg total) by mouth daily. 30 tablet 0   No current facility-administered medications for this visit.    REVIEW OF SYSTEMS:  [X]  denotes positive finding, [ ]  denotes negative finding Cardiac  Comments:  Chest pain or chest pressure:    Shortness of breath upon exertion:    Short of breath when lying flat:    Irregular heart rhythm:        Vascular    Pain in calf, thigh, or hip brought on by ambulation:    Pain in feet at night that wakes you up from your sleep:     Blood clot in your veins:    Leg swelling:         Pulmonary    Oxygen at home:    Productive cough:     Wheezing:         Neurologic    Sudden weakness in arms or legs:     Sudden numbness in arms or legs:     Sudden onset of difficulty speaking or slurred speech:    Temporary loss of vision in one eye:     Problems with dizziness:         Gastrointestinal    Blood in stool:     Vomited blood:         Genitourinary    Burning when urinating:     Blood in urine:        Psychiatric    Major depression:         Hematologic    Bleeding problems:    Problems with blood clotting too easily:        Skin    Rashes or ulcers:        Constitutional    Fever or chills:    -  PHYSICAL EXAM:   Vitals:   10/24/22 1404  BP: (!) 165/85  Pulse: 62  Resp: 16  SpO2: 96%  Weight: 140 lb (63.5 kg)  Height: 5\' 7"  (1.702 m)   Body mass index is 21.93 kg/m. GENERAL: The patient is a well-nourished female, in no acute distress. The vital signs are documented above. CARDIAC: There is a regular rate and rhythm.  VASCULAR: She has a palpable radial pulse bilaterally. The graft has a good thrill. She has a small raised area overlying her graft as documented in the photographs below.  PULMONARY: There is good air exchange bilaterally without wheezing or rales. ABDOMEN: Soft and non-tender  with normal pitched bowel sounds.  MUSCULOSKELETAL: There are no major deformities. NEUROLOGIC: No focal weakness or paresthesias are detected. SKIN: There are no ulcers or rashes noted. PSYCHIATRIC: The patient has a normal affect.  DATA:    No new data.  [name]

## 2022-10-27 ENCOUNTER — Encounter (HOSPITAL_COMMUNITY): Payer: Self-pay | Admitting: Vascular Surgery

## 2022-10-27 ENCOUNTER — Other Ambulatory Visit: Payer: Self-pay

## 2022-10-27 NOTE — Anesthesia Preprocedure Evaluation (Addendum)
Anesthesia Evaluation  Patient identified by MRN, date of birth, ID band Patient awake    Reviewed: Allergy & Precautions, NPO status , Patient's Chart, lab work & pertinent test results  Airway Mallampati: II  TM Distance: >3 FB Neck ROM: Full    Dental no notable dental hx. (+) Teeth Intact, Implants, Dental Advisory Given   Pulmonary shortness of breath, COPD,  COPD inhaler, former smoker Hx of pulmonary fibrosis   Pulmonary exam normal breath sounds clear to auscultation       Cardiovascular hypertension, Pt. on medications pulmonary hypertension+ CAD  Normal cardiovascular exam+ dysrhythmias Atrial Fibrillation + Valvular Problems/Murmurs (mod AS mild MR) AS and MR  Rhythm:Regular Rate:Normal   08/12/2022 TEE Left ventricular ejection fraction is 55 to 60%. There is mild concentric hypertrophy of the left ventricular wall. Left ventricular diastolic function has not impaired relaxation pattern. Right ventricular global systolic function is normal. The left atrium is severely dilated. Right atrium is mildly dilated. There is mild mitral regurgitation. There is trace aortic regurgitation. There is moderate aortic stenosis. Aortic valve area is 1.35 cm.  NDI = 0.36. There is moderate tricuspid valve regurgitation. There is severe pulmonary hypertension. The pressure is 67 mmHg. There is no pericardial effusion visualiz    Neuro/Psych  PSYCHIATRIC DISORDERS  Depression       GI/Hepatic   Endo/Other    Renal/GU ESRF and DialysisRenal diseaseMWF Dia;ysis Lab Results      Component                Value               Date                      NA                       135                 10/28/2022                CL                       97 (L)              10/28/2022                K                        4.4                 10/28/2022                      BUN                      52 (H)              10/28/2022                 CREATININE               7.60 (H)            10/28/2022                    Musculoskeletal  (+) Arthritis ,    Abdominal   Peds  Hematology  (+) REFUSES BLOOD PRODUCTS, JEHOVAH'S WITNESS  Anesthesia Other Findings All: omeprazole, famotidine  Reproductive/Obstetrics                             Anesthesia Physical Anesthesia Plan  ASA: 3  Anesthesia Plan: Regional   Post-op Pain Management: Regional block* and Minimal or no pain anticipated   Induction:   PONV Risk Score and Plan: Treatment may vary due to age or medical condition and Midazolam  Airway Management Planned: Nasal Cannula and Natural Airway  Additional Equipment: None  Intra-op Plan:   Post-operative Plan:   Informed Consent: I have reviewed the patients History and Physical, chart, labs and discussed the procedure including the risks, benefits and alternatives for the proposed anesthesia with the patient or authorized representative who has indicated his/her understanding and acceptance.     Dental advisory given  Plan Discussed with: CRNA  Anesthesia Plan Comments: (L ISB     PAT note by Antionette Poles, PA-C: ESRD on HD Monday Wednesday Friday.  She has an ulceration on her left upper arm graft which is at risk for bleeding she has been recommended to have the area excised.  Dr. Edilia Bo expressed some urgency and addressing this as the patient is a Jehovah's Witness and refuses blood products.  Per patient's son, patient was in the ICU at [facility] in January 2024 secondary to bowel perforation requiring colostomy placement.  States patient had episode of atrial fibrillation at that time but converted back to sinus rhythm.  Recent echo 08/12/2022 showed EF 55 to 60%, normal RV function, severely dilated LA, mild mitral regurgitation, moderate aortic stenosis (mean gradient 19 mmHg), moderate tricuspid valve regurgitation, severe pulmonary hypertension 67  mmHg.  Non-insulin-dependent DM2.  Hx of pulmonary fibrosis.  Pt will need DOS labs and eval.   EKG 03/08/2022: Atrial fibrillation with RVR.  Rate 130.  RSR prime or QR pattern in V1 suggests right ventricular conduction delay.  T wave abnormality, consider inferolateral ischemia, repolarization changes, or digitalis effect.   TTE 08/12/2022: Left ventricular ejection fraction is 55 to 60%. There is mild concentric hypertrophy of the left ventricular wall. Left ventricular diastolic function has not impaired relaxation pattern. Right ventricular global systolic function is normal. The left atrium is severely dilated. Right atrium is mildly dilated. There is mild mitral regurgitation. There is trace aortic regurgitation. There is moderate aortic stenosis. Aortic valve area is 1.35 cm.  NDI = 0.36. There is moderate tricuspid valve regurgitation. There is severe pulmonary hypertension. The pressure is 67 mmHg. There is no pericardial effusion visualized.  CT Chest 01/18/22: IMPRESSION: 1. No acute cardiopulmonary findings. 2. Chronic changes of pulmonary fibrosis without new superimposed airspace opacity. 3. Mildly displaced posterior left eighth rib fracture, unchanged. 4. Ascending thoracic aorta measures 4.0 cm in diameter. Recommend annual imaging followup by CTA or MRA. This recommendation follows 2010 ACCF/AHA/AATS/ACR/ASA/SCA/SCAI/SIR/STS/SVM Guidelines for the Diagnosis and Management of Patients with Thoracic Aortic Disease. Circulation. 2010; 121: Z366-Y403. Aortic aneurysm NOS (ICD10-I71.9) 5. Dilated pulmonary trunk compatible with pulmonary arterial hypertension. 6. Aortic and coronary artery atherosclerosis (ICD10-I70.0). 7. Emphysema (ICD10-J43.9).   )        Anesthesia Quick Evaluation

## 2022-10-27 NOTE — Progress Notes (Signed)
Anesthesia Chart Review: Same day workup  ESRD on HD Monday Wednesday Friday.  She has an ulceration on her left upper arm graft which is at risk for bleeding she has been recommended to have the area excised.  Dr. Edilia Bo expressed some urgency and addressing this as the patient is a Jehovah's Witness and refuses blood products.  Per patient's son, patient was in the ICU at [facility] in January 2024 secondary to bowel perforation requiring colostomy placement.  States patient had episode of atrial fibrillation at that time but converted back to sinus rhythm.  Recent echo 08/12/2022 showed EF 55 to 60%, normal RV function, severely dilated LA, mild mitral regurgitation, moderate aortic stenosis (mean gradient 19 mmHg), moderate tricuspid valve regurgitation, severe pulmonary hypertension 67 mmHg.  Non-insulin-dependent DM2.  Hx of pulmonary fibrosis.  Pt will need DOS labs and eval.   EKG 03/08/2022: Atrial fibrillation with RVR.  Rate 130.  RSR prime or QR pattern in V1 suggests right ventricular conduction delay.  T wave abnormality, consider inferolateral ischemia, repolarization changes, or digitalis effect.   TTE 08/12/2022: Left ventricular ejection fraction is 55 to 60%. There is mild concentric hypertrophy of the left ventricular wall. Left ventricular diastolic function has not impaired relaxation pattern. Right ventricular global systolic function is normal. The left atrium is severely dilated. Right atrium is mildly dilated. There is mild mitral regurgitation. There is trace aortic regurgitation. There is moderate aortic stenosis. Aortic valve area is 1.35 cm.  NDI = 0.36. There is moderate tricuspid valve regurgitation. There is severe pulmonary hypertension. The pressure is 67 mmHg. There is no pericardial effusion visualized.  CT Chest 01/18/22: IMPRESSION: 1. No acute cardiopulmonary findings. 2. Chronic changes of pulmonary fibrosis without new  superimposed airspace opacity. 3. Mildly displaced posterior left eighth rib fracture, unchanged. 4. Ascending thoracic aorta measures 4.0 cm in diameter. Recommend annual imaging followup by CTA or MRA. This recommendation follows 2010 ACCF/AHA/AATS/ACR/ASA/SCA/SCAI/SIR/STS/SVM Guidelines for the Diagnosis and Management of Patients with Thoracic Aortic Disease. Circulation. 2010; 121: R604-V409. Aortic aneurysm NOS (ICD10-I71.9) 5. Dilated pulmonary trunk compatible with pulmonary arterial hypertension. 6. Aortic and coronary artery atherosclerosis (ICD10-I70.0). 7. Emphysema (ICD10-J43.9).      Bianca Myers [facility] Short Stay Center/Anesthesiology Phone [phone] 10/27/2022 3:16 PM

## 2022-10-27 NOTE — Pre-Procedure Instructions (Signed)
PCP - Everlean Cherry, MD Cardiologist - Denies  Chest x-ray - 01/18/22 EKG - 01/18/22 ECHO - 08/07/22  Per Emmalyn's son, Pt was in the ICU at [facility] in January 2024. He said some cardiac tests were done, but no follow up instructions were given at discharge.   Checks Blood Sugar: Does not check since her diabetes is controlled.  Anesthesia review: Y  Patient verbally denies any shortness of breath, fever, cough and chest pain during phone call   -------------  SDW INSTRUCTIONS given:  Your procedure is scheduled on Tuesday, August 27th.  Report to [facility] Main Entrance "A" at 0950 A.M., and check in at the Admitting office.  Call this number if you have problems the morning of surgery:  [phone]   Remember:  Do not eat or drink after midnight the night before your surgery    Take these medicines the morning of surgery with A SIP OF WATER  amLODipine (NORVASC)  labetalol (NORMODYNE)  sertraline (ZOLOFT)  acetaminophen (TYLENOL)-if needed albuterol (VENTOLIN HFA)-if needed (please bring on the day of surgery) loperamide (IMODIUM)-if needed   As of today, STOP taking any Aspirin (unless otherwise instructed by your surgeon) Aleve, Naproxen, Ibuprofen, Motrin, Advil, Goody's, BC's, all herbal medications, fish oil, and all vitamins.                      Do not wear jewelry, make up, or nail polish            Do not wear lotions, powders, perfumes/colognes, or deodorant.            Do not shave 48 hours prior to surgery.  Men may shave face and neck.            Do not bring valuables to the hospital.            [facility] is not responsible for any belongings or valuables.  Do NOT Smoke (Tobacco/Vaping) 24 hours prior to your procedure If you use a CPAP at night, you may bring all equipment for your overnight stay.   Contacts, glasses, dentures or bridgework may not be worn into surgery.      For patients admitted to the hospital, discharge time will be  determined by your treatment team.   Patients discharged the day of surgery will not be allowed to drive home, and someone needs to stay with them for 24 hours.    Special instructions:   [facility]- Preparing For Surgery  Before surgery, you can play an important role. Because skin is not sterile, your skin needs to be as free of germs as possible. You can reduce the number of germs on your skin by washing with CHG (chlorahexidine gluconate) Soap before surgery.  CHG is an antiseptic cleaner which kills germs and bonds with the skin to continue killing germs even after washing.    Oral Hygiene is also important to reduce your risk of infection.  Remember - BRUSH YOUR TEETH THE MORNING OF SURGERY WITH YOUR REGULAR TOOTHPASTE  Please do not use if you have an allergy to CHG or antibacterial soaps. If your skin becomes reddened/irritated stop using the CHG.  Do not shave (including legs and underarms) for at least 48 hours prior to first CHG shower. It is OK to shave your face.  Please follow these instructions carefully.   Shower the NIGHT BEFORE SURGERY and the MORNING OF SURGERY with DIAL Soap.   Pat yourself dry with  a CLEAN TOWEL.  Wear CLEAN PAJAMAS to bed the night before surgery  Place CLEAN SHEETS on your bed the night of your first shower and DO NOT SLEEP WITH PETS.   Day of Surgery: Please shower morning of surgery  Wear Clean/Comfortable clothing the morning of surgery Do not apply any deodorants/lotions.   Remember to brush your teeth WITH YOUR REGULAR TOOTHPASTE.   Questions were answered. Patient verbalized understanding of instructions.

## 2022-10-28 ENCOUNTER — Other Ambulatory Visit: Payer: Self-pay

## 2022-10-28 ENCOUNTER — Encounter (HOSPITAL_COMMUNITY)
Admission: RE | Disposition: A | Discharge status: Home / Self Care | Payer: Self-pay | Source: Home / Self Care | Attending: Vascular Surgery

## 2022-10-28 ENCOUNTER — Ambulatory Visit (HOSPITAL_COMMUNITY): Payer: Medicare HMO | Admitting: Physician Assistant

## 2022-10-28 ENCOUNTER — Ambulatory Visit (HOSPITAL_BASED_OUTPATIENT_CLINIC_OR_DEPARTMENT_OTHER): Payer: Medicare HMO | Admitting: Physician Assistant

## 2022-10-28 ENCOUNTER — Encounter (HOSPITAL_COMMUNITY): Payer: Self-pay | Admitting: Vascular Surgery

## 2022-10-28 ENCOUNTER — Ambulatory Visit (HOSPITAL_COMMUNITY)
Admission: RE | Admit: 2022-10-28 | Discharge: 2022-10-28 | Disposition: A | Discharge status: Home / Self Care | Payer: Medicare HMO | Attending: Vascular Surgery | Admitting: Vascular Surgery

## 2022-10-28 DIAGNOSIS — Z87891 Personal history of nicotine dependence: Secondary | ICD-10-CM | POA: Diagnosis not present

## 2022-10-28 DIAGNOSIS — I120 Hypertensive chronic kidney disease with stage 5 chronic kidney disease or end stage renal disease: Secondary | ICD-10-CM

## 2022-10-28 DIAGNOSIS — N186 End stage renal disease: Secondary | ICD-10-CM

## 2022-10-28 DIAGNOSIS — E1122 Type 2 diabetes mellitus with diabetic chronic kidney disease: Secondary | ICD-10-CM | POA: Insufficient documentation

## 2022-10-28 DIAGNOSIS — Y832 Surgical operation with anastomosis, bypass or graft as the cause of abnormal reaction of the patient, or of later complication, without mention of misadventure at the time of the procedure: Secondary | ICD-10-CM | POA: Diagnosis not present

## 2022-10-28 DIAGNOSIS — Z992 Dependence on renal dialysis: Secondary | ICD-10-CM

## 2022-10-28 DIAGNOSIS — T82898A Other specified complication of vascular prosthetic devices, implants and grafts, initial encounter: Secondary | ICD-10-CM | POA: Diagnosis present

## 2022-10-28 HISTORY — PX: REVISON OF ARTERIOVENOUS FISTULA: SHX6074

## 2022-10-28 HISTORY — DX: Dyspnea, unspecified: R06.00

## 2022-10-28 LAB — POCT I-STAT, CHEM 8
BUN: 52 mg/dL — ABNORMAL HIGH (ref 8–23)
Calcium, Ion: 1.17 mmol/L (ref 1.15–1.40)
Chloride: 97 mmol/L — ABNORMAL LOW (ref 98–111)
Creatinine, Ser: 7.6 mg/dL — ABNORMAL HIGH (ref 0.44–1.00)
Glucose, Bld: 95 mg/dL (ref 70–99)
HCT: 39 % (ref 36.0–46.0)
Hemoglobin: 13.3 g/dL (ref 12.0–15.0)
Potassium: 4.4 mmol/L (ref 3.5–5.1)
Sodium: 135 mmol/L (ref 135–145)
TCO2: 26 mmol/L (ref 22–32)

## 2022-10-28 LAB — NO BLOOD PRODUCTS

## 2022-10-28 LAB — GLUCOSE, CAPILLARY
Glucose-Capillary: 88 mg/dL (ref 70–99)
Glucose-Capillary: 120 mg/dL — ABNORMAL HIGH (ref 70–99)

## 2022-10-28 SURGERY — REVISON OF ARTERIOVENOUS FISTULA
Anesthesia: Regional | Site: Arm Upper | Laterality: Left

## 2022-10-28 MED ORDER — PROPOFOL 1000 MG/100ML IV EMUL
INTRAVENOUS | Status: AC
  Filled 2022-10-28: qty 100

## 2022-10-28 MED ORDER — MIDAZOLAM HCL 2 MG/2ML IJ SOLN
INTRAMUSCULAR | Status: DC | PRN
  Administered 2022-10-28: 1 mg via INTRAVENOUS

## 2022-10-28 MED ORDER — MIDAZOLAM HCL 2 MG/2ML IJ SOLN
INTRAMUSCULAR | Status: AC
  Filled 2022-10-28: qty 2

## 2022-10-28 MED ORDER — LIDOCAINE-EPINEPHRINE (PF) 1 %-1:200000 IJ SOLN
INTRAMUSCULAR | Status: AC
  Filled 2022-10-28: qty 30

## 2022-10-28 MED ORDER — SODIUM CHLORIDE 0.9 % IV SOLN: INTRAVENOUS | Status: DC

## 2022-10-28 MED ORDER — CEFAZOLIN SODIUM-DEXTROSE 2-4 GM/100ML-% IV SOLN
2.0000 g | INTRAVENOUS | Status: AC
  Administered 2022-10-28: 2 g via INTRAVENOUS

## 2022-10-28 MED ORDER — FENTANYL CITRATE (PF) 250 MCG/5ML IJ SOLN
INTRAMUSCULAR | Status: AC
  Filled 2022-10-28: qty 5

## 2022-10-28 MED ORDER — HEPARIN 6000 UNIT IRRIGATION SOLUTION
Status: DC | PRN
  Administered 2022-10-28: 1

## 2022-10-28 MED ORDER — 0.9 % SODIUM CHLORIDE (POUR BTL) OPTIME
TOPICAL | Status: DC | PRN
  Administered 2022-10-28: 1000 mL

## 2022-10-28 MED ORDER — LIDOCAINE-EPINEPHRINE (PF) 1 %-1:200000 IJ SOLN
INTRAMUSCULAR | Status: DC | PRN
  Administered 2022-10-28: 9 mL

## 2022-10-28 MED ORDER — CHLORHEXIDINE GLUCONATE 4 % EX SOLN: 60.0000 mL | Freq: Once | CUTANEOUS | Status: DC

## 2022-10-28 MED ORDER — BACITRACIN ZINC 500 UNIT/GM EX OINT
TOPICAL_OINTMENT | CUTANEOUS | Status: AC
  Filled 2022-10-28: qty 28.35

## 2022-10-28 MED ORDER — CHLORHEXIDINE GLUCONATE 0.12 % MT SOLN: 15.0000 mL | Freq: Once | OROMUCOSAL | Status: AC

## 2022-10-28 MED ORDER — OXYCODONE-ACETAMINOPHEN 5-325 MG PO TABS: 1.0000 | ORAL_TABLET | ORAL | 0 refills | Status: DC | PRN

## 2022-10-28 MED ORDER — CEFAZOLIN SODIUM-DEXTROSE 2-4 GM/100ML-% IV SOLN
INTRAVENOUS | Status: AC
  Filled 2022-10-28: qty 100

## 2022-10-28 MED ORDER — HEPARIN 6000 UNIT IRRIGATION SOLUTION
Status: AC
  Filled 2022-10-28: qty 500

## 2022-10-28 MED ORDER — CHLORHEXIDINE GLUCONATE 0.12 % MT SOLN
OROMUCOSAL | Status: AC
  Administered 2022-10-28: 15 mL via OROMUCOSAL
  Filled 2022-10-28: qty 15

## 2022-10-28 MED ORDER — BUPIVACAINE HCL (PF) 0.5 % IJ SOLN
INTRAMUSCULAR | Status: DC | PRN
  Administered 2022-10-28: 10 mL via PERINEURAL

## 2022-10-28 MED ORDER — PROPOFOL 10 MG/ML IV BOLUS
INTRAVENOUS | Status: AC
  Filled 2022-10-28: qty 20

## 2022-10-28 MED ORDER — LIDOCAINE-EPINEPHRINE (PF) 1.5 %-1:200000 IJ SOLN
INTRAMUSCULAR | Status: DC | PRN
  Administered 2022-10-28: 12 mL via PERINEURAL

## 2022-10-28 MED ORDER — CEFAZOLIN SODIUM 1 G IJ SOLR
INTRAMUSCULAR | Status: AC
  Filled 2022-10-28: qty 20

## 2022-10-28 MED ORDER — ORAL CARE MOUTH RINSE: 15.0000 mL | Freq: Once | OROMUCOSAL | Status: AC

## 2022-10-28 MED ORDER — FENTANYL CITRATE (PF) 100 MCG/2ML IJ SOLN
INTRAMUSCULAR | Status: AC
  Administered 2022-10-28: 25 ug via INTRAVENOUS
  Filled 2022-10-28: qty 2

## 2022-10-28 MED ORDER — FENTANYL CITRATE (PF) 100 MCG/2ML IJ SOLN: 25.0000 ug | Freq: Once | INTRAMUSCULAR | Status: AC

## 2022-10-28 MED ORDER — STERILE WATER FOR IRRIGATION IR SOLN
Status: DC | PRN
  Administered 2022-10-28: 1000 mL

## 2022-10-28 SURGICAL SUPPLY — 35 items
ADH SKN CLS APL DERMABOND .7 (GAUZE/BANDAGES/DRESSINGS) ×1
APL SKNCLS STERI-STRIP NONHPOA (GAUZE/BANDAGES/DRESSINGS) ×1
ARMBAND PINK RESTRICT EXTREMIT (MISCELLANEOUS) ×1 IMPLANT
BAG COUNTER SPONGE SURGICOUNT (BAG) ×1 IMPLANT
BAG SPNG CNTER NS LX DISP (BAG) ×1
BENZOIN TINCTURE PRP APPL 2/3 (GAUZE/BANDAGES/DRESSINGS) IMPLANT
CANISTER SUCT 3000ML PPV (MISCELLANEOUS) ×1 IMPLANT
CANNULA VESSEL 3MM 2 BLNT TIP (CANNULA) ×1 IMPLANT
CLIP TI MEDIUM 6 (CLIP) ×1 IMPLANT
CLIP TI WIDE RED SMALL 6 (CLIP) ×1 IMPLANT
COVER PROBE W GEL 5X96 (DRAPES) IMPLANT
DERMABOND ADVANCED .7 DNX12 (GAUZE/BANDAGES/DRESSINGS) ×1 IMPLANT
DRSG COVADERM 4X6 (GAUZE/BANDAGES/DRESSINGS) IMPLANT
ELECT REM PT RETURN 9FT ADLT (ELECTROSURGICAL) ×1
ELECTRODE REM PT RTRN 9FT ADLT (ELECTROSURGICAL) ×1 IMPLANT
GAUZE SPONGE 2X2 STRL 8-PLY (GAUZE/BANDAGES/DRESSINGS) IMPLANT
GLOVE BIO SURGEON STRL SZ7.5 (GLOVE) ×1 IMPLANT
GLOVE BIOGEL PI IND STRL 8 (GLOVE) ×1 IMPLANT
GLOVE SURG UNDER LTX SZ8 (GLOVE) ×1 IMPLANT
GOWN STRL REUS W/ TWL LRG LVL3 (GOWN DISPOSABLE) ×3 IMPLANT
GOWN STRL REUS W/TWL LRG LVL3 (GOWN DISPOSABLE) ×3
KIT BASIN OR (CUSTOM PROCEDURE TRAY) ×1 IMPLANT
KIT TURNOVER KIT B (KITS) ×1 IMPLANT
NS IRRIG 1000ML POUR BTL (IV SOLUTION) ×1 IMPLANT
PACK CV ACCESS (CUSTOM PROCEDURE TRAY) ×1 IMPLANT
PAD ARMBOARD 7.5X6 YLW CONV (MISCELLANEOUS) ×2 IMPLANT
SLING ARM FOAM STRAP LRG (SOFTGOODS) IMPLANT
SUT ETHILON 3 0 PS 1 (SUTURE) IMPLANT
SUT MNCRL AB 4-0 PS2 18 (SUTURE) ×1 IMPLANT
SUT PROLENE 6 0 BV (SUTURE) ×1 IMPLANT
SUT VIC AB 3-0 SH 27 (SUTURE) ×1
SUT VIC AB 3-0 SH 27X BRD (SUTURE) IMPLANT
TOWEL GREEN STERILE (TOWEL DISPOSABLE) ×1 IMPLANT
UNDERPAD 30X36 HEAVY ABSORB (UNDERPADS AND DIAPERS) ×1 IMPLANT
WATER STERILE IRR 1000ML POUR (IV SOLUTION) ×1 IMPLANT

## 2022-10-28 NOTE — Op Note (Signed)
    NAME: Bianca Myers    MRN: [id number] DOB: 09/27/1945    DATE OF OPERATION: 10/28/2022  PREOP DIAGNOSIS:    Ulceration overlying left AV fistula  POSTOP DIAGNOSIS:    Same  PROCEDURE:    Excision of ulcer overlying left AV fistula and repair of left AV fistula  SURGEON: Di Kindle. Edilia Bo, MD  ASSIST: Georga Hacking RNFA  ANESTHESIA: Block  EBL: Minimal  INDICATIONS:    [name] is a 77 y.o. female who developed an ulceration over lying her left upper arm fistula.  This was at risk for bleeding and she presents for excision.  FINDINGS:   There was a small defect in the fistula below the ulceration which was repaired with a 6-0 Prolene suture.  The skin was closed over the fistula.  TECHNIQUE:   The patient was taken to the operating room after a block was placed by anesthesia.  The left arm was prepped and draped in usual sterile fashion.  Infiltrated with 1% lidocaine with epinephrine.  I circumferentially excised an ellipse of tissue.  I undermined the tissue in all directions.  I then excised the eschar overlying the fistula.  Pressure was held above and below and the small defect in the fistula was closed with a 6-0 Prolene.  There was good hemostasis.  The wound was then closed with interrupted 3 oh nylons.  Dermabond was applied.  A dressing was applied.  The patient tolerated the procedure well was transferred to the recovery in stable condition.  All needle and sponge counts were correct.  Given the complexity of the case a first assistant was necessary in order to expedient the procedure and safely perform the technical aspects of the operation.  [name], MD, FACS [facility]  DATE OF DICTATION:   10/28/2022

## 2022-10-28 NOTE — Anesthesia Postprocedure Evaluation (Signed)
Anesthesia Post Note  Patient: Bianca Myers  Procedure(s) Performed: REPAIR ULCER OVER LEFT ARM ARTERIOVENOUS FISTULA (Left: Arm Upper)     Patient location during evaluation: PACU Anesthesia Type: Regional Level of consciousness: awake and alert Pain management: pain level controlled Vital Signs Assessment: post-procedure vital signs reviewed and stable Respiratory status: spontaneous breathing, nonlabored ventilation, respiratory function stable and patient connected to nasal cannula oxygen Cardiovascular status: stable and blood pressure returned to baseline Postop Assessment: no apparent nausea or vomiting Anesthetic complications: no   No notable events documented.  Last Vitals:  Vitals:   10/28/22 1400 10/28/22 1415  BP: (!) 163/78 (!) 152/92  Pulse: 63 61  Resp: (!) 21 (!) 24  Temp: (!) 36.3 C (!) 36.3 C  SpO2: 100% 99%    Last Pain:  Vitals:   10/28/22 1415  TempSrc:   PainSc: 0-No pain                 Trevor Iha

## 2022-10-28 NOTE — Interval H&P Note (Signed)
History and Physical Interval Note:  10/28/2022 12:29 PM  Bianca Myers  has presented today for surgery, with the diagnosis of ESRD.  The various methods of treatment have been discussed with the patient and family. After consideration of risks, benefits and other options for treatment, the patient has consented to  Procedure(s): REPAIR ULCER OVER LEFT ARM ARTERIOVENOUS FISTULA (Left) as a surgical intervention.  The patient's history has been reviewed, patient examined, no change in status, stable for surgery.  I have reviewed the patient's chart and labs.  Questions were answered to the patient's satisfaction.     [name]

## 2022-10-28 NOTE — Anesthesia Procedure Notes (Addendum)
Anesthesia Regional Block: Supraclavicular block   Pre-Anesthetic Checklist: , timeout performed,  Correct Patient, Correct Site, Correct Laterality,  Correct Procedure, Correct Position, site marked,  Risks and benefits discussed,  Surgical consent,  Pre-op evaluation,  At surgeon's request and post-op pain management  Laterality: Upper and Left  Prep: chloraprep       Needles:  Injection technique: Single-shot  Needle Type: Echogenic Needle     Needle Length: 5cm  Needle Gauge: 21     Additional Needles:   Procedures:,,,, ultrasound used (permanent image in chart),,     Nerve Stimulator or Paresthesia:   Additional Responses:  Block tested.  Patient tolerated procedure well Narrative:  Start time: 10/28/2022 12:33 PM End time: 10/28/2022 12:42 PM Injection made incrementally with aspirations every 5 mL.  Performed by: Personally  Anesthesiologist: Trevor Iha, MD  Additional Notes: Block tested. Patient tolerated procedure well.

## 2022-10-28 NOTE — Transfer of Care (Signed)
Immediate Anesthesia Transfer of Care Note  Patient: Bianca Myers  Procedure(s) Performed: REPAIR ULCER OVER LEFT ARM ARTERIOVENOUS FISTULA (Left: Arm Upper)  Patient Location: PACU  Anesthesia Type:MAC combined with regional for post-op pain  Level of Consciousness: awake, alert , and oriented  Airway & Oxygen Therapy: Patient Spontanous Breathing  Post-op Assessment: Report given to RN and Post -op Vital signs reviewed and stable  Post vital signs: Reviewed and stable  Last Vitals:  Vitals Value Taken Time  BP 176/91   Temp 98   Pulse 66   Resp 23 10/28/22 1358  SpO2 100   Vitals shown include unfiled device data.  Last Pain:  Vitals:   10/28/22 0950  TempSrc:   PainSc: 0-No pain         Complications: No notable events documented.

## 2022-10-29 ENCOUNTER — Encounter (HOSPITAL_COMMUNITY): Payer: Self-pay | Admitting: Vascular Surgery

## 2022-11-12 ENCOUNTER — Encounter: Payer: Self-pay | Admitting: Physician Assistant

## 2022-11-12 ENCOUNTER — Ambulatory Visit (INDEPENDENT_AMBULATORY_CARE_PROVIDER_SITE_OTHER): Payer: Medicare HMO | Admitting: Physician Assistant

## 2022-11-12 VITALS — BP 118/86 | HR 62 | Temp 98.0°F

## 2022-11-12 DIAGNOSIS — N186 End stage renal disease: Secondary | ICD-10-CM

## 2022-11-12 DIAGNOSIS — Z992 Dependence on renal dialysis: Secondary | ICD-10-CM

## 2022-11-12 NOTE — Progress Notes (Signed)
POST OPERATIVE OFFICE NOTE    CC:  F/u for surgery  HPI:  Bianca Myers is a 77 y.o. female who is s/p excision of ulcer overlying left AV fistula on 10/28/2022 by Dr. Edilia Bo.  Her incision was primarily closed with nylon sutures.  Pt returns today for follow up.  Pt states she is been doing well since surgery.  She denies any issues with her incision such as drainage or redness.  She denies any fevers.  Dialysis is still able to use her fistula without issue.   Allergies  Allergen Reactions   Pepcid [Famotidine] Other (See Comments)    Mental status changes(Intolerance)   Omeprazole     Confusion (intolerance), Mental Status Changes    Current Outpatient Medications  Medication Sig Dispense Refill   acetaminophen (TYLENOL) 650 MG CR tablet Take 1,300 mg by mouth every 8 (eight) hours as needed for pain.     albuterol (VENTOLIN HFA) 108 (90 Base) MCG/ACT inhaler Inhale 1-2 puffs into the lungs every 4 (four) hours as needed for wheezing.     amLODipine (NORVASC) 10 MG tablet Take 1 tablet (10 mg total) by mouth daily. 30 tablet 0   amoxicillin (AMOXIL) 500 MG tablet Take 2g 1 hour prior to dental procedure 10 tablet 0   atorvastatin (LIPITOR) 10 MG tablet Take 10 mg by mouth every evening.      diclofenac Sodium (VOLTAREN) 1 % GEL Apply 2 g topically daily as needed (pain).     ferric citrate (AURYXIA) 1 GM 210 MG(Fe) tablet Take 420 mg by mouth 3 (three) times daily with meals.     fexofenadine (ALLEGRA) 180 MG tablet Take 180 mg by mouth daily as needed for allergies or rhinitis.     labetalol (NORMODYNE) 200 MG tablet Take 200 mg by mouth 2 (two) times daily.     lidocaine 4 % Place 1 patch onto the skin daily as needed (pain).     lidocaine-prilocaine (EMLA) cream Apply 1 Application topically every Monday, Wednesday, and Friday with hemodialysis.     loperamide (IMODIUM) 2 MG capsule Take 1 capsule (2 mg total) by mouth as needed for diarrhea or loose stools. 30 capsule 0    losartan (COZAAR) 100 MG tablet Take 100 mg by mouth daily.     multivitamin (RENA-VIT) TABS tablet Take 1 tablet by mouth at bedtime. 30 tablet 0   oxyCODONE-acetaminophen (PERCOCET) 5-325 MG tablet Take 1 tablet by mouth every 4 (four) hours as needed for severe pain. 12 tablet 0   polyethylene glycol (MIRALAX) 17 g packet Take 17 g by mouth daily as needed for moderate constipation. 14 each 0   sertraline (ZOLOFT) 25 MG tablet Take 25 mg by mouth daily.     No current facility-administered medications for this visit.     ROS:  See HPI  Physical Exam:  Incision: Left upper arm incision well-healed, all nylon sutures removed Extremities: Brisk left radial Doppler signal.  Palpable thrill left upper arm fistula Neuro: Intact motor and sensation of left upper extremity     Assessment/Plan:  This is a 77 y.o. female who is here for postop visit  -The patient underwent excision of ulcerated skin and repair of left upper arm fistula on 10/28/2022 -Her left upper arm incision is now well-healed.  There are no signs of infection such as drainage, redness, or fevers.  All nylon sutures were removed without issue -Her left upper arm fistula has a good thrill on palpation.  She  has no issues at dialysis -She can follow-up with our office as needed   Loel Dubonnet, PA-C [facility] [phone]   Clinic MD:  Randie Heinz

## 2023-01-04 DIAGNOSIS — I509 Heart failure, unspecified: Secondary | ICD-10-CM | POA: Diagnosis not present

## 2023-01-04 DIAGNOSIS — R7989 Other specified abnormal findings of blood chemistry: Secondary | ICD-10-CM | POA: Diagnosis not present

## 2023-01-04 DIAGNOSIS — I1311 Hypertensive heart and chronic kidney disease without heart failure, with stage 5 chronic kidney disease, or end stage renal disease: Secondary | ICD-10-CM | POA: Diagnosis not present

## 2023-01-04 DIAGNOSIS — I350 Nonrheumatic aortic (valve) stenosis: Secondary | ICD-10-CM | POA: Diagnosis not present

## 2023-01-05 DIAGNOSIS — I352 Nonrheumatic aortic (valve) stenosis with insufficiency: Secondary | ICD-10-CM | POA: Diagnosis not present

## 2023-01-05 DIAGNOSIS — R7989 Other specified abnormal findings of blood chemistry: Secondary | ICD-10-CM | POA: Diagnosis not present

## 2023-01-05 DIAGNOSIS — I509 Heart failure, unspecified: Secondary | ICD-10-CM | POA: Diagnosis not present

## 2023-01-05 DIAGNOSIS — I340 Nonrheumatic mitral (valve) insufficiency: Secondary | ICD-10-CM | POA: Diagnosis not present

## 2023-01-05 DIAGNOSIS — I1311 Hypertensive heart and chronic kidney disease without heart failure, with stage 5 chronic kidney disease, or end stage renal disease: Secondary | ICD-10-CM | POA: Diagnosis not present

## 2023-01-05 DIAGNOSIS — I350 Nonrheumatic aortic (valve) stenosis: Secondary | ICD-10-CM | POA: Diagnosis not present

## 2023-01-06 ENCOUNTER — Inpatient Hospital Stay (HOSPITAL_COMMUNITY): Payer: Medicare HMO | Admitting: Certified Registered Nurse Anesthetist

## 2023-01-06 ENCOUNTER — Inpatient Hospital Stay (HOSPITAL_COMMUNITY): Payer: Medicare HMO

## 2023-01-06 ENCOUNTER — Encounter (HOSPITAL_COMMUNITY): Admission: RE | Payer: Self-pay | Source: Home / Self Care

## 2023-01-06 ENCOUNTER — Inpatient Hospital Stay (HOSPITAL_COMMUNITY)
Admission: EM | Disposition: E | Payer: Self-pay | Source: Other Acute Inpatient Hospital | Attending: Thoracic Surgery (Cardiothoracic Vascular Surgery)

## 2023-01-06 ENCOUNTER — Inpatient Hospital Stay: Admit: 2023-01-06 | Payer: Medicare HMO

## 2023-01-06 ENCOUNTER — Ambulatory Visit (HOSPITAL_COMMUNITY): Admission: RE | Admit: 2023-01-06 | Payer: Medicare HMO | Source: Home / Self Care | Admitting: Cardiovascular Disease

## 2023-01-06 ENCOUNTER — Inpatient Hospital Stay (HOSPITAL_COMMUNITY)
Admission: EM | Admit: 2023-01-06 | Discharge: 2023-02-01 | DRG: 233 | Disposition: E | Payer: Medicare HMO | Source: Other Acute Inpatient Hospital | Attending: Thoracic Surgery (Cardiothoracic Vascular Surgery) | Admitting: Thoracic Surgery (Cardiothoracic Vascular Surgery)

## 2023-01-06 ENCOUNTER — Encounter (HOSPITAL_COMMUNITY): Payer: Self-pay

## 2023-01-06 ENCOUNTER — Encounter (HOSPITAL_COMMUNITY)
Admission: EM | Disposition: E | Payer: Self-pay | Source: Other Acute Inpatient Hospital | Attending: Thoracic Surgery (Cardiothoracic Vascular Surgery)

## 2023-01-06 DIAGNOSIS — Z515 Encounter for palliative care: Secondary | ICD-10-CM | POA: Diagnosis not present

## 2023-01-06 DIAGNOSIS — I499 Cardiac arrhythmia, unspecified: Secondary | ICD-10-CM | POA: Diagnosis not present

## 2023-01-06 DIAGNOSIS — R7989 Other specified abnormal findings of blood chemistry: Secondary | ICD-10-CM | POA: Diagnosis not present

## 2023-01-06 DIAGNOSIS — I5033 Acute on chronic diastolic (congestive) heart failure: Secondary | ICD-10-CM | POA: Diagnosis present

## 2023-01-06 DIAGNOSIS — Z888 Allergy status to other drugs, medicaments and biological substances status: Secondary | ICD-10-CM

## 2023-01-06 DIAGNOSIS — D649 Anemia, unspecified: Secondary | ICD-10-CM

## 2023-01-06 DIAGNOSIS — Z79899 Other long term (current) drug therapy: Secondary | ICD-10-CM

## 2023-01-06 DIAGNOSIS — I350 Nonrheumatic aortic (valve) stenosis: Secondary | ICD-10-CM | POA: Diagnosis not present

## 2023-01-06 DIAGNOSIS — E1122 Type 2 diabetes mellitus with diabetic chronic kidney disease: Secondary | ICD-10-CM | POA: Diagnosis present

## 2023-01-06 DIAGNOSIS — R579 Shock, unspecified: Secondary | ICD-10-CM | POA: Diagnosis not present

## 2023-01-06 DIAGNOSIS — N186 End stage renal disease: Secondary | ICD-10-CM | POA: Diagnosis present

## 2023-01-06 DIAGNOSIS — E11649 Type 2 diabetes mellitus with hypoglycemia without coma: Secondary | ICD-10-CM | POA: Diagnosis not present

## 2023-01-06 DIAGNOSIS — I25110 Atherosclerotic heart disease of native coronary artery with unstable angina pectoris: Secondary | ICD-10-CM | POA: Diagnosis not present

## 2023-01-06 DIAGNOSIS — I2729 Other secondary pulmonary hypertension: Secondary | ICD-10-CM | POA: Diagnosis not present

## 2023-01-06 DIAGNOSIS — I352 Nonrheumatic aortic (valve) stenosis with insufficiency: Secondary | ICD-10-CM | POA: Diagnosis present

## 2023-01-06 DIAGNOSIS — E8729 Other acidosis: Secondary | ICD-10-CM | POA: Diagnosis present

## 2023-01-06 DIAGNOSIS — I132 Hypertensive heart and chronic kidney disease with heart failure and with stage 5 chronic kidney disease, or end stage renal disease: Secondary | ICD-10-CM | POA: Diagnosis present

## 2023-01-06 DIAGNOSIS — K219 Gastro-esophageal reflux disease without esophagitis: Secondary | ICD-10-CM | POA: Diagnosis present

## 2023-01-06 DIAGNOSIS — Z933 Colostomy status: Secondary | ICD-10-CM

## 2023-01-06 DIAGNOSIS — Z66 Do not resuscitate: Secondary | ICD-10-CM

## 2023-01-06 DIAGNOSIS — I2510 Atherosclerotic heart disease of native coronary artery without angina pectoris: Secondary | ICD-10-CM | POA: Diagnosis not present

## 2023-01-06 DIAGNOSIS — I10 Essential (primary) hypertension: Secondary | ICD-10-CM

## 2023-01-06 DIAGNOSIS — I480 Paroxysmal atrial fibrillation: Secondary | ICD-10-CM | POA: Diagnosis not present

## 2023-01-06 DIAGNOSIS — Z951 Presence of aortocoronary bypass graft: Secondary | ICD-10-CM

## 2023-01-06 DIAGNOSIS — G9340 Encephalopathy, unspecified: Secondary | ICD-10-CM | POA: Diagnosis not present

## 2023-01-06 DIAGNOSIS — E872 Acidosis, unspecified: Secondary | ICD-10-CM | POA: Diagnosis not present

## 2023-01-06 DIAGNOSIS — I509 Heart failure, unspecified: Secondary | ICD-10-CM | POA: Diagnosis not present

## 2023-01-06 DIAGNOSIS — I5031 Acute diastolic (congestive) heart failure: Secondary | ICD-10-CM

## 2023-01-06 DIAGNOSIS — Z96642 Presence of left artificial hip joint: Secondary | ICD-10-CM | POA: Diagnosis present

## 2023-01-06 DIAGNOSIS — Z992 Dependence on renal dialysis: Secondary | ICD-10-CM | POA: Diagnosis not present

## 2023-01-06 DIAGNOSIS — D631 Anemia in chronic kidney disease: Secondary | ICD-10-CM | POA: Diagnosis present

## 2023-01-06 DIAGNOSIS — M109 Gout, unspecified: Secondary | ICD-10-CM | POA: Diagnosis present

## 2023-01-06 DIAGNOSIS — D62 Acute posthemorrhagic anemia: Secondary | ICD-10-CM | POA: Diagnosis not present

## 2023-01-06 DIAGNOSIS — Z978 Presence of other specified devices: Secondary | ICD-10-CM

## 2023-01-06 DIAGNOSIS — R4189 Other symptoms and signs involving cognitive functions and awareness: Secondary | ICD-10-CM | POA: Diagnosis not present

## 2023-01-06 DIAGNOSIS — N2581 Secondary hyperparathyroidism of renal origin: Secondary | ICD-10-CM | POA: Diagnosis present

## 2023-01-06 DIAGNOSIS — R079 Chest pain, unspecified: Secondary | ICD-10-CM | POA: Diagnosis not present

## 2023-01-06 DIAGNOSIS — I214 Non-ST elevation (NSTEMI) myocardial infarction: Secondary | ICD-10-CM | POA: Diagnosis present

## 2023-01-06 DIAGNOSIS — F32A Depression, unspecified: Secondary | ICD-10-CM | POA: Diagnosis present

## 2023-01-06 DIAGNOSIS — R570 Cardiogenic shock: Secondary | ICD-10-CM | POA: Diagnosis present

## 2023-01-06 DIAGNOSIS — K7200 Acute and subacute hepatic failure without coma: Secondary | ICD-10-CM

## 2023-01-06 DIAGNOSIS — I1311 Hypertensive heart and chronic kidney disease without heart failure, with stage 5 chronic kidney disease, or end stage renal disease: Secondary | ICD-10-CM | POA: Diagnosis not present

## 2023-01-06 DIAGNOSIS — D500 Iron deficiency anemia secondary to blood loss (chronic): Secondary | ICD-10-CM | POA: Diagnosis not present

## 2023-01-06 DIAGNOSIS — I272 Pulmonary hypertension, unspecified: Secondary | ICD-10-CM | POA: Diagnosis present

## 2023-01-06 DIAGNOSIS — J9601 Acute respiratory failure with hypoxia: Secondary | ICD-10-CM | POA: Diagnosis present

## 2023-01-06 DIAGNOSIS — Z531 Procedure and treatment not carried out because of patient's decision for reasons of belief and group pressure: Secondary | ICD-10-CM | POA: Diagnosis present

## 2023-01-06 DIAGNOSIS — D6489 Other specified anemias: Secondary | ICD-10-CM | POA: Diagnosis not present

## 2023-01-06 DIAGNOSIS — R569 Unspecified convulsions: Secondary | ICD-10-CM | POA: Diagnosis not present

## 2023-01-06 DIAGNOSIS — R34 Anuria and oliguria: Secondary | ICD-10-CM | POA: Diagnosis not present

## 2023-01-06 DIAGNOSIS — D689 Coagulation defect, unspecified: Secondary | ICD-10-CM | POA: Diagnosis present

## 2023-01-06 DIAGNOSIS — Z781 Physical restraint status: Secondary | ICD-10-CM

## 2023-01-06 DIAGNOSIS — E875 Hyperkalemia: Secondary | ICD-10-CM | POA: Diagnosis not present

## 2023-01-06 DIAGNOSIS — E785 Hyperlipidemia, unspecified: Secondary | ICD-10-CM | POA: Diagnosis present

## 2023-01-06 DIAGNOSIS — K7682 Hepatic encephalopathy: Secondary | ICD-10-CM | POA: Diagnosis not present

## 2023-01-06 DIAGNOSIS — Z9049 Acquired absence of other specified parts of digestive tract: Secondary | ICD-10-CM

## 2023-01-06 DIAGNOSIS — Z5982 Transportation insecurity: Secondary | ICD-10-CM

## 2023-01-06 DIAGNOSIS — D696 Thrombocytopenia, unspecified: Secondary | ICD-10-CM | POA: Diagnosis not present

## 2023-01-06 DIAGNOSIS — D691 Qualitative platelet defects: Secondary | ICD-10-CM | POA: Diagnosis not present

## 2023-01-06 DIAGNOSIS — Z7189 Other specified counseling: Secondary | ICD-10-CM

## 2023-01-06 DIAGNOSIS — Z87891 Personal history of nicotine dependence: Secondary | ICD-10-CM

## 2023-01-06 HISTORY — PX: LEFT HEART CATH AND CORONARY ANGIOGRAPHY: CATH118249

## 2023-01-06 HISTORY — PX: TEE WITHOUT CARDIOVERSION: SHX5443

## 2023-01-06 HISTORY — PX: CORONARY ARTERY BYPASS GRAFT: SHX141

## 2023-01-06 LAB — POCT I-STAT 7, (LYTES, BLD GAS, ICA,H+H)
Acid-Base Excess: 0 mmol/L (ref 0.0–2.0)
Acid-base deficit: 3 mmol/L — ABNORMAL HIGH (ref 0.0–2.0)
Acid-base deficit: 5 mmol/L — ABNORMAL HIGH (ref 0.0–2.0)
Acid-base deficit: 2 mmol/L (ref 0.0–2.0)
Acid-base deficit: 3 mmol/L — ABNORMAL HIGH (ref 0.0–2.0)
Bicarbonate: 23.4 mmol/L (ref 20.0–28.0)
Bicarbonate: 20.1 mmol/L (ref 20.0–28.0)
Bicarbonate: 25.2 mmol/L (ref 20.0–28.0)
Bicarbonate: 23.2 mmol/L (ref 20.0–28.0)
Bicarbonate: 23.8 mmol/L (ref 20.0–28.0)
Calcium, Ion: 1.24 mmol/L (ref 1.15–1.40)
Calcium, Ion: 1.05 mmol/L — ABNORMAL LOW (ref 1.15–1.40)
Calcium, Ion: 1.24 mmol/L (ref 1.15–1.40)
Calcium, Ion: 1.23 mmol/L (ref 1.15–1.40)
Calcium, Ion: 1.23 mmol/L (ref 1.15–1.40)
HCT: 26 % — ABNORMAL LOW (ref 36.0–46.0)
HCT: 22 % — ABNORMAL LOW (ref 36.0–46.0)
HCT: 17 % — ABNORMAL LOW (ref 36.0–46.0)
HCT: 23 % — ABNORMAL LOW (ref 36.0–46.0)
HCT: 25 % — ABNORMAL LOW (ref 36.0–46.0)
Hemoglobin: 8.8 g/dL — ABNORMAL LOW (ref 12.0–15.0)
Hemoglobin: 7.5 g/dL — ABNORMAL LOW (ref 12.0–15.0)
Hemoglobin: 5.8 g/dL — CL (ref 12.0–15.0)
Hemoglobin: 7.8 g/dL — ABNORMAL LOW (ref 12.0–15.0)
Hemoglobin: 8.5 g/dL — ABNORMAL LOW (ref 12.0–15.0)
O2 Saturation: 100 %
O2 Saturation: 100 %
O2 Saturation: 100 %
O2 Saturation: 100 %
O2 Saturation: 100 %
Patient temperature: 35.5
Patient temperature: 35.5
Potassium: 4.3 mmol/L (ref 3.5–5.1)
Potassium: 5 mmol/L (ref 3.5–5.1)
Potassium: 6.2 mmol/L — ABNORMAL HIGH (ref 3.5–5.1)
Potassium: 6 mmol/L — ABNORMAL HIGH (ref 3.5–5.1)
Potassium: 6.1 mmol/L — ABNORMAL HIGH (ref 3.5–5.1)
Sodium: 133 mmol/L — ABNORMAL LOW (ref 135–145)
Sodium: 128 mmol/L — ABNORMAL LOW (ref 135–145)
Sodium: 133 mmol/L — ABNORMAL LOW (ref 135–145)
Sodium: 133 mmol/L — ABNORMAL LOW (ref 135–145)
Sodium: 133 mmol/L — ABNORMAL LOW (ref 135–145)
TCO2: 25 mmol/L (ref 22–32)
TCO2: 21 mmol/L — ABNORMAL LOW (ref 22–32)
TCO2: 27 mmol/L (ref 22–32)
TCO2: 25 mmol/L (ref 22–32)
TCO2: 25 mmol/L (ref 22–32)
pCO2 arterial: 44.4 mm[Hg] (ref 32–48)
pCO2 arterial: 37.7 mm[Hg] (ref 32–48)
pCO2 arterial: 47.1 mm[Hg] (ref 32–48)
pCO2 arterial: 42.3 mm[Hg] (ref 32–48)
pCO2 arterial: 43.3 mm[Hg] (ref 32–48)
pH, Arterial: 7.329 — ABNORMAL LOW (ref 7.35–7.45)
pH, Arterial: 7.335 — ABNORMAL LOW (ref 7.35–7.45)
pH, Arterial: 7.337 — ABNORMAL LOW (ref 7.35–7.45)
pH, Arterial: 7.34 — ABNORMAL LOW (ref 7.35–7.45)
pH, Arterial: 7.341 — ABNORMAL LOW (ref 7.35–7.45)
pO2, Arterial: 218 mm[Hg] — ABNORMAL HIGH (ref 83–108)
pO2, Arterial: 504 mm[Hg] — ABNORMAL HIGH (ref 83–108)
pO2, Arterial: 485 mm[Hg] — ABNORMAL HIGH (ref 83–108)
pO2, Arterial: 187 mm[Hg] — ABNORMAL HIGH (ref 83–108)
pO2, Arterial: 191 mm[Hg] — ABNORMAL HIGH (ref 83–108)

## 2023-01-06 LAB — POCT I-STAT, CHEM 8
BUN: 35 mg/dL — ABNORMAL HIGH (ref 8–23)
BUN: 34 mg/dL — ABNORMAL HIGH (ref 8–23)
BUN: 33 mg/dL — ABNORMAL HIGH (ref 8–23)
BUN: 36 mg/dL — ABNORMAL HIGH (ref 8–23)
BUN: 35 mg/dL — ABNORMAL HIGH (ref 8–23)
Calcium, Ion: 1.25 mmol/L (ref 1.15–1.40)
Calcium, Ion: 1.22 mmol/L (ref 1.15–1.40)
Calcium, Ion: 1.06 mmol/L — ABNORMAL LOW (ref 1.15–1.40)
Calcium, Ion: 1.08 mmol/L — ABNORMAL LOW (ref 1.15–1.40)
Calcium, Ion: 1.25 mmol/L (ref 1.15–1.40)
Chloride: 98 mmol/L (ref 98–111)
Chloride: 99 mmol/L (ref 98–111)
Chloride: 96 mmol/L — ABNORMAL LOW (ref 98–111)
Chloride: 99 mmol/L (ref 98–111)
Chloride: 98 mmol/L (ref 98–111)
Creatinine, Ser: 5.7 mg/dL — ABNORMAL HIGH (ref 0.44–1.00)
Creatinine, Ser: 5.4 mg/dL — ABNORMAL HIGH (ref 0.44–1.00)
Creatinine, Ser: 4.8 mg/dL — ABNORMAL HIGH (ref 0.44–1.00)
Creatinine, Ser: 5.2 mg/dL — ABNORMAL HIGH (ref 0.44–1.00)
Creatinine, Ser: 5.1 mg/dL — ABNORMAL HIGH (ref 0.44–1.00)
Glucose, Bld: 106 mg/dL — ABNORMAL HIGH (ref 70–99)
Glucose, Bld: 102 mg/dL — ABNORMAL HIGH (ref 70–99)
Glucose, Bld: 118 mg/dL — ABNORMAL HIGH (ref 70–99)
Glucose, Bld: 142 mg/dL — ABNORMAL HIGH (ref 70–99)
Glucose, Bld: 144 mg/dL — ABNORMAL HIGH (ref 70–99)
HCT: 24 % — ABNORMAL LOW (ref 36.0–46.0)
HCT: 23 % — ABNORMAL LOW (ref 36.0–46.0)
HCT: 17 % — ABNORMAL LOW (ref 36.0–46.0)
HCT: 23 % — ABNORMAL LOW (ref 36.0–46.0)
HCT: 18 % — ABNORMAL LOW (ref 36.0–46.0)
Hemoglobin: 8.2 g/dL — ABNORMAL LOW (ref 12.0–15.0)
Hemoglobin: 7.8 g/dL — ABNORMAL LOW (ref 12.0–15.0)
Hemoglobin: 5.8 g/dL — CL (ref 12.0–15.0)
Hemoglobin: 7.8 g/dL — ABNORMAL LOW (ref 12.0–15.0)
Hemoglobin: 6.1 g/dL — CL (ref 12.0–15.0)
Potassium: 4.3 mmol/L (ref 3.5–5.1)
Potassium: 4.4 mmol/L (ref 3.5–5.1)
Potassium: 5 mmol/L (ref 3.5–5.1)
Potassium: 7.1 mmol/L (ref 3.5–5.1)
Potassium: 6.2 mmol/L — ABNORMAL HIGH (ref 3.5–5.1)
Sodium: 132 mmol/L — ABNORMAL LOW (ref 135–145)
Sodium: 133 mmol/L — ABNORMAL LOW (ref 135–145)
Sodium: 129 mmol/L — ABNORMAL LOW (ref 135–145)
Sodium: 130 mmol/L — ABNORMAL LOW (ref 135–145)
Sodium: 133 mmol/L — ABNORMAL LOW (ref 135–145)
TCO2: 25 mmol/L (ref 22–32)
TCO2: 25 mmol/L (ref 22–32)
TCO2: 24 mmol/L (ref 22–32)
TCO2: 27 mmol/L (ref 22–32)
TCO2: 27 mmol/L (ref 22–32)

## 2023-01-06 LAB — ECHO INTRAOPERATIVE TEE
AV Mean grad: 12.5 mm[Hg]
AV Peak grad: 25.4 mm[Hg]
Ao pk vel: 2.52 m/s
Weight: 2239.87 [oz_av]

## 2023-01-06 LAB — APTT: aPTT: 39 s — ABNORMAL HIGH (ref 24–36)

## 2023-01-06 LAB — PROTIME-INR
INR: 1.6 — ABNORMAL HIGH (ref 0.8–1.2)
Prothrombin Time: 19.5 s — ABNORMAL HIGH (ref 11.4–15.2)

## 2023-01-06 LAB — GLUCOSE, CAPILLARY
Glucose-Capillary: 82 mg/dL (ref 70–99)
Glucose-Capillary: 97 mg/dL (ref 70–99)

## 2023-01-06 SURGERY — LEFT HEART CATH AND CORONARY ANGIOGRAPHY
Anesthesia: LOCAL

## 2023-01-06 SURGERY — CORONARY ARTERY BYPASS GRAFTING (CABG)
Anesthesia: General | Site: Chest

## 2023-01-06 MED ORDER — ROCURONIUM BROMIDE 10 MG/ML (PF) SYRINGE
PREFILLED_SYRINGE | INTRAVENOUS | Status: AC
  Filled 2023-01-06: qty 20

## 2023-01-06 MED ORDER — VASOPRESSIN 20 UNIT/ML IV SOLN
INTRAVENOUS | Status: AC
  Filled 2023-01-06: qty 1

## 2023-01-06 MED ORDER — VANCOMYCIN HCL 1250 MG/250ML IV SOLN
1250.0000 mg | INTRAVENOUS | Status: AC
  Administered 2023-01-06: 1250 mg via INTRAVENOUS
  Filled 2023-01-06: qty 250

## 2023-01-06 MED ORDER — MORPHINE SULFATE (PF) 2 MG/ML IV SOLN
1.0000 mg | INTRAVENOUS | Status: DC | PRN
  Administered 2023-01-07 (×2): 1 mg via INTRAVENOUS
  Filled 2023-01-06: qty 1

## 2023-01-06 MED ORDER — PROPOFOL 10 MG/ML IV BOLUS
INTRAVENOUS | Status: AC
  Filled 2023-01-06: qty 20

## 2023-01-06 MED ORDER — CEFAZOLIN SODIUM-DEXTROSE 2-4 GM/100ML-% IV SOLN
2.0000 g | INTRAVENOUS | Status: AC
  Administered 2023-01-06 (×2): 2 g via INTRAVENOUS
  Filled 2023-01-06: qty 100

## 2023-01-06 MED ORDER — SERTRALINE HCL 25 MG PO TABS
25.0000 mg | ORAL_TABLET | Freq: Every day | ORAL | Status: DC
Start: 2023-01-07 — End: 2023-01-08
  Filled 2023-01-06 (×2): qty 1

## 2023-01-06 MED ORDER — METOCLOPRAMIDE HCL 5 MG/ML IJ SOLN
10.0000 mg | Freq: Four times a day (QID) | INTRAMUSCULAR | Status: AC
  Administered 2023-01-06 – 2023-01-08 (×4): 10 mg via INTRAVENOUS
  Filled 2023-01-06 (×4): qty 2

## 2023-01-06 MED ORDER — FENTANYL CITRATE (PF) 250 MCG/5ML IJ SOLN
INTRAMUSCULAR | Status: AC
  Filled 2023-01-06: qty 5

## 2023-01-06 MED ORDER — IOHEXOL 350 MG/ML SOLN
INTRAVENOUS | Status: DC | PRN
  Administered 2023-01-06: 62 mL via INTRA_ARTERIAL

## 2023-01-06 MED ORDER — FENTANYL CITRATE (PF) 250 MCG/5ML IJ SOLN
INTRAMUSCULAR | Status: DC | PRN
  Administered 2023-01-06: 100 ug via INTRAVENOUS
  Administered 2023-01-06: 50 ug via INTRAVENOUS
  Administered 2023-01-06: 250 ug via INTRAVENOUS

## 2023-01-06 MED ORDER — ACETAMINOPHEN 160 MG/5ML PO SOLN
1000.0000 mg | Freq: Four times a day (QID) | ORAL | Status: DC
  Administered 2023-01-07: 1000 mg
  Filled 2023-01-06: qty 40.6

## 2023-01-06 MED ORDER — EPHEDRINE SULFATE-NACL 50-0.9 MG/10ML-% IV SOSY: PREFILLED_SYRINGE | INTRAVENOUS | Status: DC | PRN

## 2023-01-06 MED ORDER — CHLORHEXIDINE GLUCONATE 0.12 % MT SOLN
15.0000 mL | OROMUCOSAL | Status: AC
  Administered 2023-01-06: 15 mL via OROMUCOSAL
  Filled 2023-01-06: qty 15

## 2023-01-06 MED ORDER — LACTATED RINGERS IV SOLN: INTRAVENOUS | Status: DC | PRN

## 2023-01-06 MED ORDER — BISACODYL 5 MG PO TBEC: 10.0000 mg | DELAYED_RELEASE_TABLET | Freq: Every day | ORAL | Status: DC

## 2023-01-06 MED ORDER — TRAMADOL HCL 50 MG PO TABS: 50.0000 mg | ORAL_TABLET | ORAL | Status: DC | PRN

## 2023-01-06 MED ORDER — ATROPINE SULFATE 0.4 MG/ML IV SOLN
INTRAVENOUS | Status: AC
  Filled 2023-01-06: qty 1

## 2023-01-06 MED ORDER — POTASSIUM CHLORIDE 2 MEQ/ML IV SOLN
80.0000 meq | INTRAVENOUS | Status: DC
  Filled 2023-01-06: qty 40

## 2023-01-06 MED ORDER — PAPAVERINE HCL 30 MG/ML IJ SOLN
INTRAMUSCULAR | Status: AC
  Filled 2023-01-06: qty 2

## 2023-01-06 MED ORDER — PHENYLEPHRINE HCL-NACL 20-0.9 MG/250ML-% IV SOLN: 0.0000 ug/min | INTRAVENOUS | Status: DC

## 2023-01-06 MED ORDER — ACETAMINOPHEN 500 MG PO TABS: 1000.0000 mg | ORAL_TABLET | Freq: Four times a day (QID) | ORAL | Status: DC

## 2023-01-06 MED ORDER — CEFAZOLIN SODIUM-DEXTROSE 2-4 GM/100ML-% IV SOLN
2.0000 g | Freq: Once | INTRAVENOUS | Status: AC
  Administered 2023-01-07: 2 g via INTRAVENOUS
  Filled 2023-01-06: qty 100

## 2023-01-06 MED ORDER — VASOPRESSIN 20 UNITS/100 ML INFUSION FOR SHOCK
0.0000 [IU]/min | INTRAVENOUS | Status: DC
  Administered 2023-01-06: .03 [IU]/min via INTRAVENOUS
  Filled 2023-01-06: qty 100

## 2023-01-06 MED ORDER — SODIUM CHLORIDE 0.9 % IV SOLN: 250.0000 mL | INTRAVENOUS | Status: AC

## 2023-01-06 MED ORDER — SODIUM CHLORIDE 0.45 % IV SOLN: INTRAVENOUS | Status: AC | PRN

## 2023-01-06 MED ORDER — METOPROLOL TARTRATE 12.5 MG HALF TABLET: 12.5000 mg | ORAL_TABLET | Freq: Two times a day (BID) | ORAL | Status: DC

## 2023-01-06 MED ORDER — MIDAZOLAM HCL (PF) 5 MG/ML IJ SOLN
INTRAMUSCULAR | Status: DC | PRN
  Administered 2023-01-06 (×2): 2 mg via INTRAVENOUS

## 2023-01-06 MED ORDER — INSULIN REGULAR(HUMAN) IN NACL 100-0.9 UT/100ML-% IV SOLN
INTRAVENOUS | Status: AC
  Administered 2023-01-06: 1.7 [IU]/h via INTRAVENOUS
  Filled 2023-01-06: qty 100

## 2023-01-06 MED ORDER — VANCOMYCIN HCL 1000 MG IV SOLR
INTRAVENOUS | Status: AC
  Filled 2023-01-06: qty 20

## 2023-01-06 MED ORDER — PAPAVERINE HCL 30 MG/ML IJ SOLN
INTRAMUSCULAR | Status: DC | PRN
  Administered 2023-01-06: 60 mg via INTRAMUSCULAR

## 2023-01-06 MED ORDER — EPINEPHRINE HCL 5 MG/250ML IV SOLN IN NS
0.0000 ug/min | INTRAVENOUS | Status: DC
  Filled 2023-01-06: qty 250

## 2023-01-06 MED ORDER — VASOPRESSIN 20 UNITS/100 ML INFUSION FOR SHOCK
0.0000 [IU]/min | INTRAVENOUS | Status: DC
  Administered 2023-01-07 – 2023-01-08 (×3): 0.04 [IU]/min via INTRAVENOUS
  Filled 2023-01-06 (×5): qty 100

## 2023-01-06 MED ORDER — LIDOCAINE HCL (CARDIAC) PF 100 MG/5ML IV SOSY
PREFILLED_SYRINGE | INTRAVENOUS | Status: DC | PRN
  Administered 2023-01-06: 80 mg via INTRATRACHEAL

## 2023-01-06 MED ORDER — NOREPINEPHRINE 4 MG/250ML-% IV SOLN
0.0000 ug/min | INTRAVENOUS | Status: DC
  Administered 2023-01-06: 6 ug/min via INTRAVENOUS

## 2023-01-06 MED ORDER — TRANEXAMIC ACID 1000 MG/10ML IV SOLN
1.5000 mg/kg/h | INTRAVENOUS | Status: AC
  Administered 2023-01-06: 1.5 mg/kg/h via INTRAVENOUS
  Filled 2023-01-06: qty 25

## 2023-01-06 MED ORDER — NITROGLYCERIN IN D5W 200-5 MCG/ML-% IV SOLN: 0.0000 ug/min | INTRAVENOUS | Status: DC

## 2023-01-06 MED ORDER — ALBUTEROL SULFATE (2.5 MG/3ML) 0.083% IN NEBU: 3.0000 mL | INHALATION_SOLUTION | RESPIRATORY_TRACT | Status: DC | PRN

## 2023-01-06 MED ORDER — NOREPINEPHRINE 4 MG/250ML-% IV SOLN
0.0000 ug/min | INTRAVENOUS | Status: AC
  Administered 2023-01-06: 4 ug/min via INTRAVENOUS
  Filled 2023-01-06: qty 250

## 2023-01-06 MED ORDER — TRANEXAMIC ACID (OHS) BOLUS VIA INFUSION
15.0000 mg/kg | INTRAVENOUS | Status: AC
  Administered 2023-01-06: 952.5 mg via INTRAVENOUS
  Filled 2023-01-06: qty 953

## 2023-01-06 MED ORDER — POTASSIUM CHLORIDE 10 MEQ/50ML IV SOLN: 10.0000 meq | INTRAVENOUS | Status: DC

## 2023-01-06 MED ORDER — BISACODYL 10 MG RE SUPP: 10.0000 mg | Freq: Every day | RECTAL | Status: DC

## 2023-01-06 MED ORDER — VANCOMYCIN HCL IN DEXTROSE 1-5 GM/200ML-% IV SOLN: 1000.0000 mg | Freq: Once | INTRAVENOUS | Status: DC

## 2023-01-06 MED ORDER — LACTATED RINGERS IV SOLN: INTRAVENOUS | Status: AC

## 2023-01-06 MED ORDER — DEXTROSE 50 % IV SOLN
0.0000 mL | INTRAVENOUS | Status: DC | PRN
  Administered 2023-01-07: 35 mL via INTRAVENOUS
  Filled 2023-01-06: qty 50

## 2023-01-06 MED ORDER — SODIUM CHLORIDE 0.9% FLUSH: 3.0000 mL | INTRAVENOUS | Status: DC | PRN

## 2023-01-06 MED ORDER — ORAL CARE MOUTH RINSE: 15.0000 mL | OROMUCOSAL | Status: DC | PRN

## 2023-01-06 MED ORDER — PANTOPRAZOLE SODIUM 40 MG IV SOLR
40.0000 mg | Freq: Every day | INTRAVENOUS | Status: AC
  Administered 2023-01-06 – 2023-01-07 (×2): 40 mg via INTRAVENOUS
  Filled 2023-01-06 (×2): qty 10

## 2023-01-06 MED ORDER — LIDOCAINE HCL (PF) 1 % IJ SOLN
INTRAMUSCULAR | Status: AC
  Filled 2023-01-06: qty 30

## 2023-01-06 MED ORDER — AMIODARONE HCL 200 MG PO TABS
400.0000 mg | ORAL_TABLET | Freq: Two times a day (BID) | ORAL | Status: DC
  Filled 2023-01-06: qty 2

## 2023-01-06 MED ORDER — MIDAZOLAM HCL 2 MG/2ML IJ SOLN
2.0000 mg | INTRAMUSCULAR | Status: DC | PRN
Start: 2023-01-06 — End: 2023-01-08

## 2023-01-06 MED ORDER — PANTOPRAZOLE SODIUM 40 MG PO TBEC: 40.0000 mg | DELAYED_RELEASE_TABLET | Freq: Every day | ORAL | Status: DC

## 2023-01-06 MED ORDER — MIDAZOLAM HCL 2 MG/2ML IJ SOLN
INTRAMUSCULAR | Status: AC
  Filled 2023-01-06: qty 2

## 2023-01-06 MED ORDER — INSULIN REGULAR(HUMAN) IN NACL 100-0.9 UT/100ML-% IV SOLN: INTRAVENOUS | Status: DC

## 2023-01-06 MED ORDER — MAGNESIUM SULFATE 4 GM/100ML IV SOLN: 4.0000 g | Freq: Once | INTRAVENOUS | Status: DC

## 2023-01-06 MED ORDER — FENTANYL CITRATE (PF) 100 MCG/2ML IJ SOLN
INTRAMUSCULAR | Status: AC
  Filled 2023-01-06: qty 2

## 2023-01-06 MED ORDER — CHLORHEXIDINE GLUCONATE CLOTH 2 % EX PADS: 6.0000 | MEDICATED_PAD | Freq: Every day | CUTANEOUS | Status: DC

## 2023-01-06 MED ORDER — MAGNESIUM SULFATE 50 % IJ SOLN
40.0000 meq | INTRAMUSCULAR | Status: DC
  Filled 2023-01-06: qty 9.85

## 2023-01-06 MED ORDER — DOCUSATE SODIUM 100 MG PO CAPS: 200.0000 mg | ORAL_CAPSULE | Freq: Every day | ORAL | Status: DC

## 2023-01-06 MED ORDER — ORAL CARE MOUTH RINSE
15.0000 mL | OROMUCOSAL | Status: DC
  Administered 2023-01-07 (×5): 15 mL via OROMUCOSAL

## 2023-01-06 MED ORDER — VANCOMYCIN HCL 1000 MG IV SOLR
INTRAVENOUS | Status: DC
  Filled 2023-01-06: qty 20

## 2023-01-06 MED ORDER — MIDAZOLAM HCL 2 MG/2ML IJ SOLN
INTRAMUSCULAR | Status: DC | PRN
  Administered 2023-01-06: 1 mg via INTRAVENOUS

## 2023-01-06 MED ORDER — ASPIRIN 81 MG PO CHEW: 324.0000 mg | CHEWABLE_TABLET | Freq: Once | ORAL | Status: DC

## 2023-01-06 MED ORDER — OXYCODONE HCL 5 MG PO TABS
5.0000 mg | ORAL_TABLET | ORAL | Status: DC | PRN
  Administered 2023-01-07: 5 mg via ORAL
  Filled 2023-01-06: qty 1

## 2023-01-06 MED ORDER — ATORVASTATIN CALCIUM 10 MG PO TABS: 10.0000 mg | ORAL_TABLET | Freq: Every evening | ORAL | Status: DC

## 2023-01-06 MED ORDER — ASPIRIN 325 MG PO TBEC: 325.0000 mg | DELAYED_RELEASE_TABLET | Freq: Every day | ORAL | Status: DC

## 2023-01-06 MED ORDER — ASPIRIN 81 MG PO CHEW: 324.0000 mg | CHEWABLE_TABLET | Freq: Every day | ORAL | Status: DC

## 2023-01-06 MED ORDER — HEPARIN SODIUM (PORCINE) 1000 UNIT/ML IJ SOLN
INTRAMUSCULAR | Status: DC | PRN
  Administered 2023-01-06: 22000 [IU] via INTRAVENOUS

## 2023-01-06 MED ORDER — ONDANSETRON HCL 4 MG/2ML IJ SOLN
INTRAMUSCULAR | Status: AC
  Filled 2023-01-06: qty 2

## 2023-01-06 MED ORDER — PHENYLEPHRINE 80 MCG/ML (10ML) SYRINGE FOR IV PUSH (FOR BLOOD PRESSURE SUPPORT)
PREFILLED_SYRINGE | INTRAVENOUS | Status: AC
  Filled 2023-01-06: qty 40

## 2023-01-06 MED ORDER — DEXMEDETOMIDINE HCL IN NACL 400 MCG/100ML IV SOLN
0.0000 ug/kg/h | INTRAVENOUS | Status: DC
  Administered 2023-01-07: 0.02 ug/kg/h via INTRAVENOUS
  Filled 2023-01-06: qty 100

## 2023-01-06 MED ORDER — PHENYLEPHRINE HCL-NACL 20-0.9 MG/250ML-% IV SOLN
30.0000 ug/min | INTRAVENOUS | Status: DC
  Filled 2023-01-06: qty 250

## 2023-01-06 MED ORDER — PROTAMINE SULFATE 10 MG/ML IV SOLN
INTRAVENOUS | Status: DC | PRN
  Administered 2023-01-06: 220 mg via INTRAVENOUS

## 2023-01-06 MED ORDER — SODIUM CHLORIDE 0.9 % IV SOLN: INTRAVENOUS | Status: AC

## 2023-01-06 MED ORDER — ALBUMIN HUMAN 5 % IV SOLN
12.5000 g | INTRAVENOUS | Status: DC | PRN
  Administered 2023-01-07: 12.5 g via INTRAVENOUS

## 2023-01-06 MED ORDER — HEPARIN 30,000 UNITS/1000 ML (OHS) CELLSAVER SOLUTION
Status: DC
  Filled 2023-01-06: qty 1000

## 2023-01-06 MED ORDER — MIDAZOLAM HCL (PF) 10 MG/2ML IJ SOLN
INTRAMUSCULAR | Status: AC
  Filled 2023-01-06: qty 2

## 2023-01-06 MED ORDER — ACETAMINOPHEN 160 MG/5ML PO SOLN
650.0000 mg | Freq: Once | ORAL | Status: AC
  Administered 2023-01-06: 650 mg
  Filled 2023-01-06: qty 20.3

## 2023-01-06 MED ORDER — NOREPINEPHRINE BITARTRATE 1 MG/ML IV SOLN
INTRAVENOUS | Status: DC | PRN
  Administered 2023-01-06 (×7): 1 mL via INTRAVENOUS
  Administered 2023-01-06: .5 mL via INTRAVENOUS

## 2023-01-06 MED ORDER — FENTANYL CITRATE (PF) 100 MCG/2ML IJ SOLN
INTRAMUSCULAR | Status: DC | PRN
  Administered 2023-01-06 (×2): 25 ug via INTRAVENOUS

## 2023-01-06 MED ORDER — TRANEXAMIC ACID (OHS) PUMP PRIME SOLUTION
2.0000 mg/kg | INTRAVENOUS | Status: DC
  Filled 2023-01-06: qty 1.27

## 2023-01-06 MED ORDER — PLASMA-LYTE A IV SOLN
INTRAVENOUS | Status: AC
  Filled 2023-01-06: qty 2.5

## 2023-01-06 MED ORDER — LABETALOL HCL 5 MG/ML IV SOLN
INTRAVENOUS | Status: AC
  Filled 2023-01-06: qty 4

## 2023-01-06 MED ORDER — METOPROLOL TARTRATE 25 MG/10 ML ORAL SUSPENSION: 12.5000 mg | Freq: Two times a day (BID) | ORAL | Status: DC

## 2023-01-06 MED ORDER — CEFAZOLIN SODIUM-DEXTROSE 2-4 GM/100ML-% IV SOLN
2.0000 g | INTRAVENOUS | Status: DC
  Filled 2023-01-06: qty 100

## 2023-01-06 MED ORDER — FERRIC CITRATE 1 GM 210 MG(FE) PO TABS
420.0000 mg | ORAL_TABLET | Freq: Three times a day (TID) | ORAL | Status: DC
  Filled 2023-01-06 (×4): qty 2

## 2023-01-06 MED ORDER — ROCURONIUM BROMIDE 10 MG/ML (PF) SYRINGE
PREFILLED_SYRINGE | INTRAVENOUS | Status: DC | PRN
  Administered 2023-01-06 (×2): 50 mg via INTRAVENOUS
  Administered 2023-01-06: 100 mg via INTRAVENOUS

## 2023-01-06 MED ORDER — LABETALOL HCL 5 MG/ML IV SOLN
INTRAVENOUS | Status: DC | PRN
  Administered 2023-01-06 (×2): 10 mg via INTRAVENOUS

## 2023-01-06 MED ORDER — MILRINONE LACTATE IN DEXTROSE 20-5 MG/100ML-% IV SOLN
0.3000 ug/kg/min | INTRAVENOUS | Status: DC
  Filled 2023-01-06: qty 100

## 2023-01-06 MED ORDER — VANCOMYCIN HCL 500 MG/100ML IV SOLN
500.0000 mg | Freq: Once | INTRAVENOUS | Status: AC
  Administered 2023-01-07: 500 mg via INTRAVENOUS
  Filled 2023-01-06: qty 100

## 2023-01-06 MED ORDER — 0.9 % SODIUM CHLORIDE (POUR BTL) OPTIME
TOPICAL | Status: DC | PRN
  Administered 2023-01-06: 1000 mL
  Administered 2023-01-06: 5000 mL

## 2023-01-06 MED ORDER — LIDOCAINE HCL (PF) 1 % IJ SOLN
INTRAMUSCULAR | Status: DC | PRN
  Administered 2023-01-06: 10 mL

## 2023-01-06 MED ORDER — ONDANSETRON HCL 4 MG/2ML IJ SOLN: 4.0000 mg | Freq: Four times a day (QID) | INTRAMUSCULAR | Status: DC | PRN

## 2023-01-06 MED ORDER — VASOPRESSIN 20 UNIT/ML IV SOLN
INTRAVENOUS | Status: DC | PRN
  Administered 2023-01-06: 1 [IU] via INTRAVENOUS
  Administered 2023-01-06: 2 [IU] via INTRAVENOUS
  Administered 2023-01-06: 1 [IU] via INTRAVENOUS

## 2023-01-06 MED ORDER — VANCOMYCIN HCL 1000 MG IV SOLR
INTRAVENOUS | Status: DC | PRN
  Administered 2023-01-06: 1 g

## 2023-01-06 MED ORDER — ASPIRIN 81 MG PO CHEW
324.0000 mg | CHEWABLE_TABLET | Freq: Once | ORAL | Status: AC
  Administered 2023-01-06: 324 mg
  Filled 2023-01-06: qty 4

## 2023-01-06 MED ORDER — DEXMEDETOMIDINE HCL IN NACL 400 MCG/100ML IV SOLN
0.1000 ug/kg/h | INTRAVENOUS | Status: AC
  Administered 2023-01-06: .4 ug/kg/h via INTRAVENOUS
  Filled 2023-01-06: qty 100

## 2023-01-06 MED ORDER — PROPOFOL 10 MG/ML IV BOLUS
INTRAVENOUS | Status: DC | PRN
  Administered 2023-01-06: 40 mg via INTRAVENOUS

## 2023-01-06 MED ORDER — METOPROLOL TARTRATE 5 MG/5ML IV SOLN: 2.5000 mg | INTRAVENOUS | Status: DC | PRN

## 2023-01-06 MED ORDER — SODIUM CHLORIDE 0.9 % IV SOLN
INTRAVENOUS | Status: DC | PRN
  Administered 2023-01-06: 10 mL/h via INTRAVENOUS

## 2023-01-06 MED ORDER — NITROGLYCERIN IN D5W 200-5 MCG/ML-% IV SOLN
INTRAVENOUS | Status: DC | PRN
  Administered 2023-01-06: 10 ug/min via INTRAVENOUS

## 2023-01-06 MED ORDER — NITROGLYCERIN IN D5W 200-5 MCG/ML-% IV SOLN
2.0000 ug/min | INTRAVENOUS | Status: DC
  Filled 2023-01-06: qty 250

## 2023-01-06 MED ORDER — SODIUM CHLORIDE 0.9% FLUSH
3.0000 mL | Freq: Two times a day (BID) | INTRAVENOUS | Status: DC
  Administered 2023-01-07 (×2): 3 mL via INTRAVENOUS

## 2023-01-06 MED ORDER — LIDOCAINE 2% (20 MG/ML) 5 ML SYRINGE
INTRAMUSCULAR | Status: AC
  Filled 2023-01-06: qty 10

## 2023-01-06 SURGICAL SUPPLY — 94 items
5 PAIRS OF YELLOW SUTURE CLAMP (MISCELLANEOUS) ×2
ADAPTER CARDIO PERF ANTE/RETRO (ADAPTER) ×2 IMPLANT
ADH SKN CLS APL DERMABOND .7 (GAUZE/BANDAGES/DRESSINGS) ×2
ADPR PRFSN 84XANTGRD RTRGD (ADAPTER) ×2
ANTIFOG SOL W/FOAM PAD STRL (MISCELLANEOUS) ×2
BAG DECANTER FOR FLEXI CONT (MISCELLANEOUS) ×2 IMPLANT
BLADE CLIPPER SURG (BLADE) ×2 IMPLANT
BLADE MICRO SHARP 3 15 DEG (BLADE) ×2 IMPLANT
BLADE STERNUM SYSTEM 6 (BLADE) ×2 IMPLANT
BLADE SURG 11 STRL SS (BLADE) IMPLANT
BNDG CMPR 5X4 KNIT ELC UNQ LF (GAUZE/BANDAGES/DRESSINGS) ×2
BNDG CMPR 6 X 5 YARDS HK CLSR (GAUZE/BANDAGES/DRESSINGS) ×2
BNDG ELASTIC 4INX 5YD STR LF (GAUZE/BANDAGES/DRESSINGS) IMPLANT
BNDG ELASTIC 4X5.8 VLCR STR LF (GAUZE/BANDAGES/DRESSINGS) ×2 IMPLANT
BNDG ELASTIC 6INX 5YD STR LF (GAUZE/BANDAGES/DRESSINGS) IMPLANT
BNDG ELASTIC 6X5.8 VLCR STR LF (GAUZE/BANDAGES/DRESSINGS) ×2 IMPLANT
BNDG GAUZE DERMACEA FLUFF 4 (GAUZE/BANDAGES/DRESSINGS) ×2 IMPLANT
BNDG GZE DERMACEA 4 6PLY (GAUZE/BANDAGES/DRESSINGS) ×2
CABLE PACING FASLOC BIEGE (MISCELLANEOUS) IMPLANT
CABLE PACING FASLOC BLUE (MISCELLANEOUS) IMPLANT
CANISTER SUCT 3000ML PPV (MISCELLANEOUS) ×2 IMPLANT
CANNULA MC2 2 STG 36/46 NON-V (CANNULA) IMPLANT
CANNULA NON VENT 20FR 12 (CANNULA) ×2 IMPLANT
CATH RETROPLEGIA CORONARY 14FR (CATHETERS) ×2 IMPLANT
CATH ROBINSON RED A/P 18FR (CATHETERS) ×6 IMPLANT
CATH THOR STR 32F SOFT 20 RADI (CATHETERS) ×4 IMPLANT
CATH THORACIC 28FR RT ANG (CATHETERS) ×2 IMPLANT
CLAMP SUTURE YELLOW 5 PAIRS (MISCELLANEOUS) ×2 IMPLANT
CLIP TI LARGE 6 (CLIP) ×2 IMPLANT
CLIP TI MEDIUM 24 (CLIP) IMPLANT
CLIP TI WIDE RED SMALL 24 (CLIP) IMPLANT
CNTNR URN SCR LID CUP LEK RST (MISCELLANEOUS) ×4 IMPLANT
CONT SPEC 4OZ STRL OR WHT (MISCELLANEOUS) ×4
DERMABOND ADVANCED .7 DNX12 (GAUZE/BANDAGES/DRESSINGS) IMPLANT
DRAPE CV SPLIT W-CLR ANES SCRN (DRAPES) ×2 IMPLANT
DRAPE INCISE IOBAN 66X45 STRL (DRAPES) ×2 IMPLANT
DRAPE PERI GROIN 82X75IN TIB (DRAPES) ×2 IMPLANT
DRSG AQUACEL AG ADV 3.5X10 (GAUZE/BANDAGES/DRESSINGS) ×2 IMPLANT
ELECT BLADE 4.0 EZ CLEAN MEGAD (MISCELLANEOUS) ×2
ELECT REM PT RETURN 9FT ADLT (ELECTROSURGICAL) ×4
ELECTRODE BLDE 4.0 EZ CLN MEGD (MISCELLANEOUS) ×2 IMPLANT
ELECTRODE REM PT RTRN 9FT ADLT (ELECTROSURGICAL) ×4 IMPLANT
FELT TEFLON 1X6 (MISCELLANEOUS) ×2 IMPLANT
GAUZE 4X4 16PLY ~~LOC~~+RFID DBL (SPONGE) IMPLANT
GAUZE SPONGE 4X4 12PLY STRL (GAUZE/BANDAGES/DRESSINGS) ×4 IMPLANT
GAUZE SPONGE 4X4 12PLY STRL LF (GAUZE/BANDAGES/DRESSINGS) IMPLANT
GLOVE BIOGEL PI IND STRL 6.5 (GLOVE) IMPLANT
GLOVE SS BIOGEL STRL SZ 6 (GLOVE) IMPLANT
GOWN STRL REUS W/ TWL LRG LVL3 (GOWN DISPOSABLE) ×12 IMPLANT
GOWN STRL REUS W/TWL LRG LVL3 (GOWN DISPOSABLE) ×12
INSERT FOGARTY 61MM (MISCELLANEOUS) IMPLANT
KIT BASIN OR (CUSTOM PROCEDURE TRAY) ×2 IMPLANT
KIT TURNOVER KIT B (KITS) ×2 IMPLANT
KIT VASOVIEW HEMOPRO 2 VH 4000 (KITS) ×2 IMPLANT
MARKER DISTAL GRAFT W/ HOLDER (MISCELLANEOUS) ×4 IMPLANT
NS IRRIG 1000ML POUR BTL (IV SOLUTION) ×10 IMPLANT
PACK E OPEN HEART (SUTURE) ×2 IMPLANT
PACK OPEN HEART (CUSTOM PROCEDURE TRAY) ×2 IMPLANT
PAD ARMBOARD 7.5X6 YLW CONV (MISCELLANEOUS) ×4 IMPLANT
PAD ELECT DEFIB RADIOL ZOLL (MISCELLANEOUS) ×2 IMPLANT
PENCIL BUTTON HOLSTER BLD 10FT (ELECTRODE) ×2 IMPLANT
POSITIONER HEAD DONUT 9IN (MISCELLANEOUS) ×2 IMPLANT
PUNCH AORTIC ROTATE 4.0MM (MISCELLANEOUS) ×2 IMPLANT
PUNCH AORTIC ROTATE 4.5MM 8IN (MISCELLANEOUS) IMPLANT
SOLUTION ANTFG W/FOAM PAD STRL (MISCELLANEOUS) IMPLANT
SPONGE T-LAP 18X18 ~~LOC~~+RFID (SPONGE) IMPLANT
STAPLER VISISTAT 35W (STAPLE) IMPLANT
SUPPORT HEART JANKE-BARRON (MISCELLANEOUS) ×2 IMPLANT
SUT MNCRL AB 4-0 PS2 18 (SUTURE) ×4 IMPLANT
SUT PERMA SILK 0 CT1 (SUTURE) IMPLANT
SUT PROLENE 4 0 RB 1 (SUTURE) ×4
SUT PROLENE 4 0 SH DA (SUTURE) ×2 IMPLANT
SUT PROLENE 4-0 RB1 .5 CRCL 36 (SUTURE) ×2 IMPLANT
SUT PROLENE 6 0 C 1 30 (SUTURE) IMPLANT
SUT PROLENE 7 0 BV 1 (SUTURE) IMPLANT
SUT PROLENE 7 0 BV1 MDA (SUTURE) ×2 IMPLANT
SUT PROLENE 8 0 BV175 6 (SUTURE) IMPLANT
SUT STEEL SZ 6 DBL 3X14 BALL (SUTURE) ×4 IMPLANT
SUT VIC AB 0 CTX 36 (SUTURE) ×4
SUT VIC AB 0 CTX36XBRD ANTBCTR (SUTURE) ×4 IMPLANT
SUT VIC AB 2-0 CT1 27 (SUTURE) ×6
SUT VIC AB 2-0 CT1 TAPERPNT 27 (SUTURE) ×4 IMPLANT
SYR 20ML LL LF (SYRINGE) IMPLANT
SYSTEM SAHARA CHEST DRAIN ATS (WOUND CARE) ×2 IMPLANT
TAG SUTURE CLAMP YLW 5PR (MISCELLANEOUS) ×2
TAPE CLOTH SURG 4X10 WHT LF (GAUZE/BANDAGES/DRESSINGS) IMPLANT
TAPE PAPER 2X10 WHT MICROPORE (GAUZE/BANDAGES/DRESSINGS) IMPLANT
TOWEL GREEN STERILE (TOWEL DISPOSABLE) ×2 IMPLANT
TOWEL GREEN STERILE FF (TOWEL DISPOSABLE) ×2 IMPLANT
TRAY FOLEY SLVR 14FR TEMP STAT (SET/KITS/TRAYS/PACK) IMPLANT
TRAY FOLEY SLVR 16FR TEMP STAT (SET/KITS/TRAYS/PACK) ×2 IMPLANT
TUBING LAP HI FLOW INSUFFLATIO (TUBING) ×2 IMPLANT
UNDERPAD 30X36 HEAVY ABSORB (UNDERPADS AND DIAPERS) ×2 IMPLANT
WATER STERILE IRR 1000ML POUR (IV SOLUTION) ×4 IMPLANT

## 2023-01-06 SURGICAL SUPPLY — 7 items
CATH INFINITI 5FR MULTPACK ANG (CATHETERS) IMPLANT
KIT MICROPUNCTURE NIT STIFF (SHEATH) IMPLANT
PACK CARDIAC CATHETERIZATION (CUSTOM PROCEDURE TRAY) ×1 IMPLANT
SET ATX-X65L (MISCELLANEOUS) IMPLANT
SHEATH PINNACLE 5F 10CM (SHEATH) IMPLANT
SHEATH PROBE COVER 6X72 (BAG) IMPLANT
WIRE EMERALD 3MM-J .035X150CM (WIRE) IMPLANT

## 2023-01-06 NOTE — Consult Note (Addendum)
Cardiology Consultation   Patient ID: Bianca Myers MRN: [id number]; DOB: [birth year]  Admit date: 01/18/2023 Date of Consult: 01/07/2023  PCP:  Everlean Cherry, MD   [facility] Providers Cardiologist:  Norman Herrlich, MD     Patient Profile:   Bianca Myers is a 77 y.o. female with a hx of ESRD on HD, DM, anemia, Jehovah witness who is being seen 01/10/2023 for the evaluation of chest pain/elevated troponin at the request of Dr. Dulce Sellar.  History of Present Illness:   Bianca Myers is a 77 year old female with past medical history noted above.  She presented to [facility] on 11/3 with complaints of significant shortness of breath and worsening dyspnea.  She was seen back in January 2014 by Dr. Dulce Sellar in the setting of paroxysmal atrial fibrillation.  She was hospitalized back in January 2024 with a colonic perforation post colonoscopy and required a sigmoid colectomy and colostomy.  She called EMS on 11/3 with complaints.  On arrival to the ED she was noted to be quite uncomfortable with difficulty breathing and required BiPAP as well as IV nitroglycerin and nebulizer treatments.  O2 sats were noted at 90% on room air chest x-ray at [facility] showed increased interstitial pulmonary markings with blunting of bilateral costophrenic angle suggestive of congestive heart failure.  Her labs showed sodium 136, potassium 3.8, proBNP 93,600, procalcitonin 0.87, WBC 11.4, hemoglobin 9.6, troponin I 0.05>>> 0.63>>> 1.13>>> 1.22.  Respiratory panel negative blood cultures negative EKG on admission showed sinus rhythm, 94 bpm, LVH, nonspecific changes.  Underwent echocardiogram 11/4 with LVEF of 55 to 60%, moderate concentric hypertrophy, normal RV, severely dilated left atrium, mildly dilated right atrium, moderate MR, moderate aortic stenosis with mean gradient 26 mmHg and aortic valve area of 1.05 cm, pulmonary artery systolic pressure of 61 mmHg.  She was evaluated by Dr.  Dulce Sellar with recommendations to proceed to [facility] for failure evaluation with cardiac catheterization.    Of note she was seen by nephrology while at [facility] and dialyzed on Sunday and Monday.  Admission H&P at [facility] notes history of CAD with prior bypass surgery but chest x-ray with no mention of sternal wires and no scar noted. She denies any hx of the same.   On arrival she denies any chest pain, reports her breathing has significantly improved.   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   left side   Depression    Diabetes mellitus (HCC)    Dyslipidemia 03/15/2021   Dyspnea    ESRD (end stage renal disease) on dialysis [facility])    Dialysis M/W/F   Gout    History of anemia due to chronic kidney disease    History of degenerative disc disease    Hypertension    Pneumonia    Refusal of blood transfusions as patient is Jehovah's Witness    Secondary hyperparathyroidism (HCC)    Spondylisthesis     Past Surgical History:  Procedure Laterality Date   A/V FISTULAGRAM Left 12/16/2018   Procedure: A/V FISTULAGRAM;  Surgeon: Cephus Shelling, MD;  Location: [facility] INVASIVE CV LAB;  Service: Cardiovascular;  Laterality: Left;   A/V FISTULAGRAM Left 06/13/2019   Procedure: A/V FISTULAGRAM;  Surgeon: Cephus Shelling, MD;  Location: [facility] INVASIVE CV LAB;  Service: Cardiovascular;  Laterality: Left;   AV FISTULA PLACEMENT Left 08/24/2018   Procedure: ARTERIOVENOUS (  AV) FISTULA CREATION;  Surgeon: Maeola Harman, MD;  Location: [facility] OR;  Service: Vascular;  Laterality: Left;   AV FISTULA PLACEMENT Left 07/12/2019   Procedure: INSERTION OF LEFT UPPER ARM  ARTERIOVENOUS (AV) GORE-TEX GRAFT;  Surgeon: Sherren Kerns, MD;  Location: [facility] OR;  Service: Vascular;  Laterality: Left;   BASCILIC VEIN TRANSPOSITION Left 11/04/2018   Procedure: BASCILIC VEIN TRANSPOSITION SECOND  STAGE;  Surgeon: Maeola Harman, MD;  Location: [facility] OR;  Service: Vascular;  Laterality: Left;   COLON SURGERY     COLONOSCOPY     FRACTURE SURGERY Left    wrist   HIP SURGERY     INSERTION OF DIALYSIS CATHETER N/A 06/15/2019   Procedure: INSERTION OF TUNNELED  DIALYSIS CATHETER RIGHT INTERNAL JUGULAR;  Surgeon: Nada Libman, MD;  Location: [facility] OR;  Service: Vascular;  Laterality: N/A;   IR FLUORO GUIDE CV LINE RIGHT  08/21/2018   IR FLUORO GUIDE CV LINE RIGHT  06/08/2019   IR PERC TUN PERIT CATH WO PORT S&I /IMAG  12/19/2019   IR REMOVAL TUN CV CATH W/O FL  03/11/2019   IR THROMBECTOMY AV FISTULA W/THROMBOLYSIS/PTA INC/SHUNT/IMG LEFT Left 12/21/2019   IR US GUIDE VASC ACCESS LEFT  12/21/2019   IR US GUIDE VASC ACCESS RIGHT  08/21/2018   IR US GUIDE VASC ACCESS RIGHT  06/08/2019   IR US GUIDE VASC ACCESS RIGHT  12/19/2019   PERIPHERAL VASCULAR BALLOON ANGIOPLASTY Left 06/13/2019   Procedure: PERIPHERAL VASCULAR BALLOON ANGIOPLASTY;  Surgeon: Cephus Shelling, MD;  Location: [facility] INVASIVE CV LAB;  Service: Cardiovascular;  Laterality: Left;  AVF   PERIPHERAL VASCULAR INTERVENTION Left 12/16/2018   Procedure: PERIPHERAL VASCULAR INTERVENTION;  Surgeon: Cephus Shelling, MD;  Location: [facility] INVASIVE CV LAB;  Service: Cardiovascular;  Laterality: Left;  arm fistula   REVISON OF ARTERIOVENOUS FISTULA Left 10/28/2022   Procedure: REPAIR ULCER OVER LEFT ARM ARTERIOVENOUS FISTULA;  Surgeon: Chuck Hint, MD;  Location: [facility] OR;  Service: Vascular;  Laterality: Left;   SHOULDER SURGERY Right 2021   due to a bone infection   TOTAL HIP ARTHROPLASTY Left 06/10/2019   Procedure: TOTAL HIP ARTHROPLASTY ANTERIOR APPROACH;  Surgeon: Tarry Kos, MD;  Location: [facility] OR;  Service: Orthopedics;  Laterality: Left;      Inpatient Medications: Scheduled Meds:  Continuous Infusions:  PRN Meds:   Allergies:    Allergies  Allergen Reactions   Pepcid [Famotidine] Other (See  Comments)    Mental status changes(Intolerance)   Omeprazole     Confusion (intolerance), Mental Status Changes    Social History:   Social History   Socioeconomic History   Marital status: Widowed    Spouse name: Not on file   Number of children: Not on file   Years of education: Not on file   Highest education level: Not on file  Occupational History   Occupation: retired  Tobacco Use   Smoking status: Former    Current packs/day: 0.00    Types: Cigarettes    Quit date: 1973    Years since quitting: 51.8   Smokeless tobacco: Never  Vaping Use   Vaping status: Never Used  Substance and Sexual Activity   Alcohol use: Yes    Comment: occ   Drug use: Never   Sexual activity: Not on file  Other Topics Concern   Not on file  Social History Narrative   Not on file   Social Determinants of Corporate investment banker  Strain: Low Risk  (04/08/2019)   Received from [facility] visits prior to 05/03/2022., [facility] visits prior to 05/03/2022.   Overall Financial Resource Strain (CARDIA)    Difficulty of Paying Living Expenses: Not hard at all  Food Insecurity: Low Risk  (08/14/2022)   Received from [facility]   Hunger Vital Sign    Worried About Running Out of Food in the Last Year: Never true    Ran Out of Food in the Last Year: Never true  Transportation Needs: Not on file (08/14/2022)  Recent Concern: Transportation Needs - Unmet Transportation Needs (08/14/2022)   Received from [employer]    In the past 12 months, has lack of reliable transportation kept you from medical appointments, meetings, work or from getting things needed for daily living? : Yes  Physical Activity: Not on file  Stress: Not on file  Social Connections: Not on file  Intimate Partner Violence: Not on file    Family History:    Family History  Family history unknown: Yes     ROS:  Please see the history of present illness.    All other ROS reviewed and negative.     Physical Exam/Data:  There were no vitals filed for this visit. No intake or output data in the 24 hours ending 01/12/2023 1544    10/28/2022    9:38 AM 10/24/2022    2:04 PM 01/18/2022   12:03 PM  Last 3 Weights  Weight (lbs) 140 lb 140 lb 140 lb  Weight (kg) 63.504 kg 63.504 kg 63.504 kg     There is no height or weight on file to calculate BMI.  General:  Well nourished, well developed, in no acute distress HEENT: normal Neck: no JVD Vascular: No carotid bruits; Distal pulses 2+ bilaterally Cardiac:  normal S1, S2; RRR; + systolic murmur right upper sternal border Lungs:  clear to auscultation bilaterally, no wheezing, rhonchi or rales  Abd: soft, nontender, no hepatomegaly  Ext: no edema Musculoskeletal:  No deformities, BUE and BLE strength normal and equal.  Left upper arm AV fistula Skin: warm and dry  Neuro:  CNs 2-12 intact, no focal abnormalities noted Psych:  Normal affect   EKG:  The EKG was personally reviewed and demonstrates: Initial EKG 11/3 sinus rhythm, 94 bpm, LVH, nonspecific changes  Relevant CV Studies:  Echo at [facility] noted above  Laboratory Data:  High Sensitivity Troponin:  No results for input(s): "TROPONINIHS" in the last 720 hours.   ChemistryNo results for input(s): "NA", "K", "CL", "CO2", "GLUCOSE", "BUN", "CREATININE", "CALCIUM", "MG", "GFRNONAA", "GFRAA", "ANIONGAP" in the last 168 hours.  No results for input(s): "PROT", "ALBUMIN", "AST", "ALT", "ALKPHOS", "BILITOT" in the last 168 hours. Lipids No results for input(s): "CHOL", "TRIG", "HDL", "LABVLDL", "LDLCALC", "CHOLHDL" in the last 168 hours.  HematologyNo results for input(s): "WBC", "RBC", "HGB", "HCT", "MCV", "MCH", "MCHC", "RDW", "PLT" in the last 168 hours. Thyroid No results for input(s): "TSH", "FREET4" in the last 168 hours.  BNPNo results for input(s): "BNP", "PROBNP" in the last 168 hours.  DDimer No results for input(s): "DDIMER" in  the last 168 hours.   Radiology/Studies:  No results found.   Assessment and Plan:   Aala Ransom is a 77 y.o. female with a hx of ESRD on HD, DM, anemia who is being seen 01/18/2023 for the evaluation of chest pain/elevated troponin at the request of Dr. Dulce Sellar.  Chest pain NSTEMI -- Presented  to [facility] with chest pain and dyspnea that started on Saturday.  Troponin I peaked at 1.22, EKG with nonspecific changes.  She denies any history of known CAD.  Given her risk factors it was recommended that she transfer to [facility] for further evaluation with definitive cardiac catheterization. -- On arrival she is pain-free -- Discussed cardiac catheterization with patient and she is willing to proceed -- further recommendations pending cath -- on ASA, Crestor 10 mg daily, carvedilol 3.125 mg twice daily (prior to transfer at [facility])  Informed Consent   Shared Decision Making/Informed Consent The risks [stroke (1 in 1000), death (1 in 1000), kidney failure [usually temporary] (1 in 500), bleeding (1 in 200), allergic reaction [possibly serious] (1 in 200)], benefits (diagnostic support and management of coronary artery disease) and alternatives of a cardiac catheterization were discussed in detail with Bianca Myers and she is willing to proceed.  Acute hypoxic respiratory failure -- Initially required BiPAP while at [facility], has undergone HD on Sunday and Monday since weaned to nasal cannula  -- Chest x-ray on admission with pulmonary edema -- Leukocytosis, procalcitonin 0.87 -- Blood cultures NTD, was treated with vancomycin  HFpEF -- Echocardiogram at [facility] with LVEF of 55 to 60% -- Volume management per HD  ESRD on HD -- On a Monday, Wednesday, Friday schedule -- Received dialysis on Sunday and Monday -- Per nephrology  Severe pulmonary hypertension -- Echo at [facility] with pulmonary artery pressure of 61 mmHg  Per  primary Anemia Diabetes Depression  Risk Assessment/Risk Scores:     TIMI Risk Score for Unstable Angina or Non-ST Elevation MI:   The patient's TIMI risk score is 5, which indicates a 26% risk of all cause mortality, new or recurrent myocardial infarction or need for urgent revascularization in the next 14 days.  New York Heart Association (NYHA) Functional Class NYHA Class II  For questions or updates, please contact [facility] Please consult [email or web address] for contact info under    Signed, Laverda Page, NP  01/13/2023 3:44 PM

## 2023-01-06 NOTE — Progress Notes (Signed)
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  He underwent coronary artery bypass grafting on 11/5 and is admitted to the ICU postoperatively.  Examination consistent with normal vitals.  Saturating 100% on 50% FiO2.  PA pressures within normal limits, cardiac index marginally low.  Currently infusing Precedex, vasopressin, norepinephrine and insulin.  Adequate ventilation and oxygenation on current ventilator settings.  eICU Interventions  Goal SBP 90-1 50.  Norepinephrine, vasopressin ordered as needed.  Perioperative vancomycin and cefazolin  Primary management per T CTS, available as needed  DVT prophylaxis with SCDs GI prophylaxis with pantoprazole     Intervention Category Evaluation Type: New Patient Evaluation  [name] 01/03/2023, 10:43 PM

## 2023-01-06 NOTE — Consult Note (Signed)
[address]       [place] [address]             [phone]           Alina Gilkey [facility] Medical Record #[id number] Date of Birth: November 13, 1945  No ref. provider found Everlean Cherry, MD  Chief Complaint:   Called to cath lab for critical Left main stenosis  History of Present Illness:     Pt is a 77 yo female who started having chest pressure the past few months. This worsened considerably the past 10 days  and associated with SOB. Pt was admitted to [facility] and was felt to be cardiac in nature with elevated troponins. PT had echo with normal LV function with a mean gradient across aortic valve of . Underwent dialysis Sunday and Monday and was transferred to [facility] Cath lab where severe LM stenosis and 3 VCAD was noted. Pt was felt due to pain recently and critical nature of LM stenosis to require emergency bypass. Pt was felt to have calcified aorto iliac disease to make IABP not safe. Pt currently pain free. Reportedly had no gradient across AV during cath. 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 She has HTN, GERD and is a Soil scientist witness.       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   Depression    Diabetes mellitus (HCC)    Dyslipidemia 03/15/2021   Dyspnea    ESRD (end stage renal disease) on dialysis [facility])    Dialysis M/W/F   Gout    History of anemia due to chronic kidney disease    History of degenerative disc disease    Hypertension    Pneumonia    Refusal of blood transfusions as patient is Jehovah's Witness    Secondary hyperparathyroidism (HCC)    Spondylisthesis     Past Surgical History:  Procedure Laterality Date   A/V FISTULAGRAM Left 12/16/2018   Procedure: A/V FISTULAGRAM;  Surgeon: Cephus Shelling, MD;  [facility]: [facility] INVASIVE CV LAB;  Service: Cardiovascular;  Laterality: Left;   A/V  FISTULAGRAM Left 06/13/2019   Procedure: A/V FISTULAGRAM;  Surgeon: Cephus Shelling, MD;  Location: [facility] INVASIVE CV LAB;  Service: Cardiovascular;  Laterality: Left;   AV FISTULA PLACEMENT Left 08/24/2018   Procedure: ARTERIOVENOUS (AV) FISTULA CREATION;  Surgeon: Maeola Harman, MD;  Location: [facility] OR;  Service: Vascular;  Laterality: Left;   AV FISTULA PLACEMENT Left 07/12/2019   Procedure: INSERTION OF LEFT UPPER ARM  ARTERIOVENOUS (AV) GORE-TEX GRAFT;  Surgeon: Sherren Kerns, MD;  Location: [facility] OR;  Service: Vascular;  Laterality: Left;   BASCILIC VEIN TRANSPOSITION Left 11/04/2018   Procedure: BASCILIC VEIN TRANSPOSITION SECOND STAGE;  Surgeon: Maeola Harman, MD;  Location: [facility] OR;  Service: Vascular;  Laterality: Left;   COLON SURGERY     COLONOSCOPY     FRACTURE SURGERY Left    wrist   HIP SURGERY     INSERTION OF DIALYSIS CATHETER N/A 06/15/2019   Procedure: INSERTION OF TUNNELED  DIALYSIS CATHETER RIGHT INTERNAL JUGULAR;  Surgeon: Nada Libman, MD;  Location: [facility] OR;  Service: Vascular;  Laterality: N/A;   IR FLUORO GUIDE CV LINE RIGHT  08/21/2018   IR FLUORO GUIDE CV LINE RIGHT  06/08/2019   IR PERC  TUN PERIT CATH WO PORT S&I /IMAG  12/19/2019   IR REMOVAL TUN CV CATH W/O FL  03/11/2019   IR THROMBECTOMY AV FISTULA W/THROMBOLYSIS/PTA INC/SHUNT/IMG LEFT Left 12/21/2019   IR US GUIDE VASC ACCESS LEFT  12/21/2019   IR US GUIDE VASC ACCESS RIGHT  08/21/2018   IR US GUIDE VASC ACCESS RIGHT  06/08/2019   IR US GUIDE VASC ACCESS RIGHT  12/19/2019   PERIPHERAL VASCULAR BALLOON ANGIOPLASTY Left 06/13/2019   Procedure: PERIPHERAL VASCULAR BALLOON ANGIOPLASTY;  Surgeon: Cephus Shelling, MD;  [facility]: [facility] INVASIVE CV LAB;  Service: Cardiovascular;  Laterality: Left;  AVF   PERIPHERAL VASCULAR INTERVENTION Left 12/16/2018   Procedure: PERIPHERAL VASCULAR INTERVENTION;  Surgeon: Cephus Shelling, MD;  Location: [facility] INVASIVE CV LAB;  Service:  Cardiovascular;  Laterality: Left;  arm fistula   REVISON OF ARTERIOVENOUS FISTULA Left 10/28/2022   Procedure: REPAIR ULCER OVER LEFT ARM ARTERIOVENOUS FISTULA;  Surgeon: Chuck Hint, MD;  Location: [facility] OR;  Service: Vascular;  Laterality: Left;   SHOULDER SURGERY Right 2021   due to a bone infection   TOTAL HIP ARTHROPLASTY Left 06/10/2019   Procedure: TOTAL HIP ARTHROPLASTY ANTERIOR APPROACH;  Surgeon: Tarry Kos, MD;  Location: [facility] OR;  Service: Orthopedics;  Laterality: Left;    Social History   Tobacco Use  Smoking Status Former   Current packs/day: 0.00   Types: Cigarettes   Quit date: 1973   Years since quitting: 51.8  Smokeless Tobacco Never    Social History   Substance and Sexual Activity  Alcohol Use Yes   Comment: occ    Social History   Socioeconomic History   Marital status: Widowed    Spouse name: Not on file   Number of children: Not on file   Years of education: Not on file   Highest education level: Not on file  Occupational History   Occupation: retired  Tobacco Use   Smoking status: Former    Current packs/day: 0.00    Types: Cigarettes    Quit date: 1973    Years since quitting: 51.8   Smokeless tobacco: Never  Vaping Use   Vaping status: Never Used  Substance and Sexual Activity   Alcohol use: Yes    Comment: occ   Drug use: Never   Sexual activity: Not on file  Other Topics Concern   Not on file  Social History Narrative   Not on file   Social Determinants of Health   Financial Resource Strain: Low Risk  (04/08/2019)   Received from [facility] visits prior to 05/03/2022., [facility] visits prior to 05/03/2022.   Overall Financial Resource Strain (CARDIA)    Difficulty of Paying Living Expenses: Not hard at all  Food Insecurity: Low Risk  (08/14/2022)   Received from [facility]   Hunger Vital Sign    Worried About Running Out of Food in the Last Year: Never true    Ran Out of  Food in the Last Year: Never true  Transportation Needs: Not on file (08/14/2022)  Recent Concern: Transportation Needs - Unmet Transportation Needs (08/14/2022)   Received from [employer]    In the past 12 months, has lack of reliable transportation kept you from medical appointments, meetings, work or from getting things needed for daily living? : Yes  Physical Activity: Not on file  Stress: Not on file  Social Connections: Not on file  Intimate Partner Violence: Not  on file    Allergies  Allergen Reactions   Pepcid [Famotidine] Other (See Comments)    Mental status changes(Intolerance)   Omeprazole     Confusion (intolerance), Mental Status Changes    Current Facility-Administered Medications  Medication Dose Route Frequency Provider Last Rate Last Admin   0.9 %  sodium chloride infusion    Continuous PRN Marykay Lex, MD 10 mL/hr at 01/25/2023 1558 10 mL/hr at 01/30/2023 1558   ceFAZolin (ANCEF) IVPB 2g/100 mL premix  2 g Intravenous To OR Eugenio Hoes, MD       ceFAZolin (ANCEF) IVPB 2g/100 mL premix  2 g Intravenous To OR Eugenio Hoes, MD       Melene Muller ON 01/07/2023] Chlorhexidine Gluconate Cloth 2 % PADS 6 each  6 each Topical Q0600 Dagoberto Ligas, MD       dexmedetomidine (PRECEDEX) 400 MCG/100ML (4 mcg/mL) infusion  0.1-0.7 mcg/kg/hr Intravenous To OR Eugenio Hoes, MD       EPINEPHrine (ADRENALIN) 5 mg in NS 250 mL (0.02 mg/mL) premix infusion  0-10 mcg/min Intravenous To OR Eugenio Hoes, MD       fentaNYL (SUBLIMAZE) injection    PRN Marykay Lex, MD   25 mcg at 01/31/2023 1604   heparin 30,000 units/NS 1000 mL solution for CELLSAVER   Other To OR Eugenio Hoes, MD       heparin sodium (porcine) 2,500 Units, papaverine 30 mg in electrolyte-A (PLASMALYTE-A PH 7.4) 500 mL irrigation   Irrigation To OR Eugenio Hoes, MD       insulin regular, human (MYXREDLIN) 100 units/ 100 mL infusion   Intravenous To OR Eugenio Hoes, MD       iohexol (OMNIPAQUE) 350  MG/ML injection    PRN Marykay Lex, MD   62 mL at 01/13/2023 1642   labetalol (NORMODYNE) injection    PRN Marykay Lex, MD   10 mg at 01/05/2023 1638   magnesium sulfate (IV Push/IM) injection 40 mEq  40 mEq Other To OR Eugenio Hoes, MD       midazolam (VERSED) injection    PRN Marykay Lex, MD   1 mg at 01/17/2023 1556   milrinone (PRIMACOR) 20 MG/100 ML (0.2 mg/mL) infusion  0.3 mcg/kg/min Intravenous To OR Eugenio Hoes, MD       nitroGLYCERIN 50 mg in dextrose 5 % 250 mL (0.2 mg/mL) infusion    Continuous PRN Marykay Lex, MD 3 mL/hr at 01/14/2023 1559 10 mcg/min at 01/24/2023 1559   nitroGLYCERIN 50 mg in dextrose 5 % 250 mL (0.2 mg/mL) infusion  2-200 mcg/min Intravenous To OR Eugenio Hoes, MD       norepinephrine (LEVOPHED) 4mg  in (0.016 mg/mL) premix infusion  0-40 mcg/min Intravenous To OR Eugenio Hoes, MD       phenylephrine (NEO-SYNEPHRINE) 20mg /NS premix infusion  30-200 mcg/min Intravenous To OR Eugenio Hoes, MD       potassium chloride injection 80 mEq  80 mEq Other To OR Eugenio Hoes, MD       tranexamic acid (CYKLOKAPRON) 2,500 mg in sodium chloride 0.9 % 250 mL (10 mg/mL) infusion  1.5 mg/kg/hr Intravenous To OR Eugenio Hoes, MD       tranexamic acid (CYKLOKAPRON) bolus via infusion - over 30 minutes 952.5 mg  15 mg/kg Intravenous To OR Eugenio Hoes, MD       tranexamic acid (CYKLOKAPRON) pump prime solution 127 mg  2 mg/kg Intracatheter To OR Eugenio Hoes, MD  vancomycin (VANCOCIN) 1,000 mg in sodium chloride 0.9 % 1,000 mL irrigation   Irrigation To OR Eugenio Hoes, MD       vancomycin (VANCOREADY) IVPB 1250 mg/250 mL  1,250 mg Intravenous To OR Eugenio Hoes, MD         Family History  Family history unknown: Yes       Physical Exam: Wt 63.5 kg Comment: 10/2022  SpO2 94%   BMI 21.93 kg/m  No vericosities Neuro intact    Diagnostic Studies & Laboratory data: I have personally reviewed the following studies and agree with the  findings     Recent Radiology Findings:   No results found.    Recent Lab Findings: Lab Results  Component Value Date   WBC 9.0 01/18/2022   HGB 13.3 10/28/2022   HCT 39.0 10/28/2022   PLT 272 01/18/2022   GLUCOSE 95 10/28/2022   ALT 13 03/15/2021   AST 19 03/15/2021   NA 135 10/28/2022   K 4.4 10/28/2022   CL 97 (L) 10/28/2022   CREATININE 7.60 (H) 10/28/2022   BUN 52 (H) 10/28/2022   CO2 29 01/18/2022   TSH 1.113 02/28/2019   INR 1.3 (H) 06/08/2019   HGBA1C 5.2 03/15/2021      Assessment / Plan:     77 yo female with recent NSTEMI with normal LV function, no AS by cath and with severe LM stenosis. We discussed the need for revascularization and she understands the increased risk due to her age, emergency nature of surgery, ESRD, and Jahovah witness. I discussed this with her son on the phone and he also agrees to proceed.   I have spent 60 min in review of the records, viewing studies and in face to face with patient and in coordination of future care    Eugenio Hoes 01/18/2023 4:58 PM

## 2023-01-06 NOTE — Hospital Course (Signed)
History of Present Illness:     Bianca Myers is a 77 yo female who started having chest pressure the past few months. This worsened considerably the past 10 days and is associated with SOB. Pt was admitted to [facility] and was felt to be cardiac in nature with elevated troponins. PT had echo with normal LV function with a mean gradient across aortic valve of . Underwent dialysis Sunday and Monday and was transferred to [facility] Cath lab where severe LM stenosis and 3V CAD was noted. Pt was felt due to pain recently and critical nature of LM stenosis to require emergency bypass. Pt was felt to have calcified aorto iliac disease to make IABP not safe. Pt is currently pain free. Reportedly had no gradient across AV during cath. 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 She has HTN, GERD and is a Soil scientist witness.   Dr. Leafy Ro reviewed the patient's diagnostic studies and determined she would benefit from surgical intervention. He reviewed the treatment options as well as the risks and benefits with the patient. She was agreeable to proceed with surgery.  Hospital Course: Bianca Myers was emergently brought to the operating room on 01/15/2023. She underwent CABGx3 utilizing LIMA to LAD, SVG to OM, and SVG to PDA. She tolerated the procedure well and was transferred to the SICU in stable condition. POD1 she would nod to voices but was not able to follow commands, she remained intubated. She requiring Vasopressin and Norepinephrine for blood pressure support. These were weaned as hemodynamics tolerated. Nephrology was consulted for assistance with ESRD requiring dialysis.

## 2023-01-06 NOTE — Interval H&P Note (Signed)
History and Physical Interval Note:  01/03/2023 5:01 PM  Bianca Myers  has presented today for surgery, with the diagnosis of chest pain.  The various methods of treatment have been discussed with the patient and family. After consideration of risks, benefits and other options for treatment, the patient has consented to  Procedure(s): LEFT HEART CATH AND CORONARY ANGIOGRAPHY (N/A) as a surgical intervention.  The patient's history has been reviewed, patient examined, no change in status, stable for surgery.  I have reviewed the patient's chart and labs.  Questions were answered to the patient's satisfaction.     Bryan Lemma

## 2023-01-06 NOTE — Transfer of Care (Signed)
Immediate Anesthesia Transfer of Care Note  Patient: Bianca Myers  Procedure(s) Performed: CORONARY ARTERY BYPASS GRAFTING (CABG) x3 USING LEFT INTERNAL MAMMARY ARTERY AND HARVESTED RIGHT GREATER SAPHENOUS VEIN ENDOSCOPICALLY (Chest) TRANSESOPHAGEAL ECHOCARDIOGRAM  Patient Location: ICU  Anesthesia Type:General  Level of Consciousness: Patient remains intubated per anesthesia plan  Airway & Oxygen Therapy: Patient remains intubated per anesthesia plan and Patient placed on Ventilator (see vital sign flow sheet for setting)  Post-op Assessment: Report given to RN and Post -op Vital signs reviewed and stable  Post vital signs: Reviewed and stable  Last Vitals:  Vitals Value Taken Time  BP 121/90 01/23/2023 2230  Temp 35.5 C 01/02/2023 2240  Pulse 89 01/05/2023 2238  Resp 16 01/31/2023 2240  SpO2 100 % 01/21/2023 2238  Vitals shown include unfiled device data.  Last Pain:  Vitals:   01/07/2023 1650  PainSc: 0-No pain         Complications: No notable events documented.

## 2023-01-06 NOTE — Anesthesia Procedure Notes (Addendum)
Central Venous Catheter Insertion Performed by: Lorena Nation, MD, anesthesiologist Start/End11/30/2024 6:00 PM, 01/11/2023 6:20 PM Patient location: OR. Preanesthetic checklist: patient identified, IV checked, site marked, risks and benefits discussed, surgical consent, monitors and equipment checked, pre-op evaluation, timeout performed and anesthesia consent Patient sedated Hand hygiene performed  and maximum sterile barriers used  Catheter size: 8.5 Fr MAC introducer Procedure performed using ultrasound guided technique. Ultrasound Notes:anatomy identified, needle tip was noted to be adjacent to the nerve/plexus identified, no ultrasound evidence of intravascular and/or intraneural injection and image(s) printed for medical record Attempts: 1 Following insertion, line sutured and dressing applied. Post procedure assessment: blood return through all ports, free fluid flow and no air  Patient tolerated the procedure well with no immediate complications. Additional procedure comments: First attempt at right internal jugular, successful access to vessel, but unable to get wire to pass 20cm. Transitioned to left internal jugular without complication.   Marland Kitchen

## 2023-01-06 NOTE — Brief Op Note (Signed)
    BRIEF CARDIAC CATHETERIZATION REPORT  NAME: Bianca Myers  MRN: [id number] 01/25/2023 5:03 PM  SURGEON:  Surgeons and Role:    * Marykay Lex, MD - Primary   PROCEDURE:  Procedure(s): LEFT HEART CATH AND CORONARY ANGIOGRAPHY (N/A)  PATIENT:  Bianca Myers  77 y.o. is a female with a hx of ESRD on HD, DM, anemia, Jehovah witness who is being seen 01/11/2023 for the evaluation of chest pain/elevated troponin at the request of Dr. Dulce Sellar.  She began having pain on 01/03/2023 that persisted until the following day.  When she presented to [facility] she had elevated troponins and was found to have flash pulm edema.  She underwent dialysis on 11/3 and 11/4 for flash pulm edema.  Echo showed preserved EF, but elevated PAP and LVEDP.  She was transferred to [facility] for cardiac catheterization.   PRE-OPERATIVE DIAGNOSIS: Non-STEMI, acute diastolic heart failure, flash pulm edema, pulmonary hypertension, and stage renal disease on dialysis  POST-OPERATIVE DIAGNOSIS:   Severe multivessel disease: Distal left main 90% stenosis involving ostial LCx 70% and small RI 70% stenoses. Proximal LAD-D1 bifurcation roughly 50% stenosis Mid OM1 80% eccentric Mid RCA at RV marginal branch focal 70%; mid PDA eccentric 80% LVEDP 24 - 29 mmHg with no clear Aortic Valve Gradient  PROCEDURE PERFORMED Time Out: Verified patient identification, verified procedure, site/side was marked, verified correct patient position, special equipment/implants available, medications/allergies/relevent history reviewed, required imaging and test results available. Performed.  Access:  * RIGHT Common Femoral Artery: 5 Fr Sheath - fluoroscopically guided modified Seldinger Technique  -- Direct ultrasound guidance used.  Permanent image obtained and placed on chart.  Left Heart Catheterization: 5Fr Catheters advanced or exchanged over a J-wire under direct fluoroscopic guidance into the ascending  aorta; JL 4 catheter advanced first.  * LV Hemodynamics (no LV Gram): Angled pigtail catheter * Left Coronary Artery Cineangiography: JL 4 catheter  * Right Coronary Artery Cineangiography: JR4 catheter   Review of initial angiography revealed: Severe left main disease involving the ostium of the LCx, and RI as well as significant RCA disease as well.  Preparations are made for CVTS consultation for emergent CABG with Dr. Leafy Ro  Upon completion of Angiogaphy, the catheter was removed completely out of the body over a wire, without complication.  Femoral sheath was sutured in place and hooked up for arterial pressure monitoring  MEDICATIONS SQ Lidocaine 16 mL Radial Cocktail: 3 mg Verapmil in 10 mL NS  ANESTHESIA:   regional and IV sedation  EBL: Minimal   COUNTS:  YES  PATIENT DISPOSITION:   Going to the OR directly from Cath Lab  DICTATION: .Note written in EPIC  PLAN OF CARE: Admit to inpatient  => she will go directly to the OR for emergency CABG     Bryan Lemma, MD

## 2023-01-06 NOTE — Op Note (Signed)
CARDIOVASCULAR SURGERY OPERATIVE NOTE  01/19/2023 Bianca Myers [id number]  Surgeon:  Ashley Akin, MD  First Assistant: Aloha Gell [facility]                               An experienced assistant was required given the complexity of this surgery and the standard of surgical care. The assistant was needed for exposure, dissection, suctioning, retraction of delicate tissues and sutures, instrument exchange and for overall help during this procedure.     Preoperative Diagnosis:  Left main coronary artery disease  Postoperative Diagnosis:  Same   Procedure: Emergent Coronary artery bypass x 3 with LIMA to LAD and RSVG from aorta to PDA and from aorta to OM  Anesthesia:  General Endotracheal   Clinical History/Surgical Indication:  77 yo female with recent NSTEMI with normal LV function, no AS by cath and with severe LM stenosis. We discussed the need for revascularization and she understands the increased risk due to her age, emergency nature of surgery, ESRD, and Jahovah witness. I discussed this with her son on the phone and he also agrees to proceed.   Findings:  Pt started out with a HCT of 26%. The heart had nl EF.  The coronary arteries were healthy appearing 2 mm vessels. At conclusion of procedure there was still normal LV function.   Preparation:  The patient was seen in the preoperative holding area and the correct patient, correct operation were confirmed with the patient after reviewing the medical record and catheterization. The consent was signed by me. Preoperative antibiotics were given. A pulmonary arterial line and radial arterial line were placed by the anesthesia team. The patient was taken back to the operating room and positioned supine on the operating room table. After being placed under general endotracheal anesthesia by the anesthesia team a foley catheter was placed. The neck, chest, abdomen, and both legs were prepped with betadine soap and solution and  draped in the usual sterile manner. A surgical time-out was taken and the correct patient and operative procedure were confirmed with the nursing and anesthesia staff.   Operation: A median sternotomy incision was then created and the sternum was divided the sternal saw simultaneously from the right leg the assistant harvested the saphenous vein utilizing the Endo tunnel technique and this wound was closed multilayer's absorbable suture. The left internal mammary artery was then harvested and packed in a papaverine soaked sponge.  Heparin was delivered.  Aorta was cannulated with a 20 [place] Starns aortic cannula and a two-stage cannula was placed in the right atrium for venous return.  With adequate confirmation of anticoagulation cardiopulmonary bypass was instituted.  Should be noted that the patient had auto priming of the heart-lung machine secondary to her being a Jehovah's Witness and a low hematocrit. Antegrade and retrograde cardioplegia catheters were placed in the ascending aorta coronary sinus respectively.  Aortic cross-clamp was placed.  Cold blood potassium cardioplegia was then delivered for total of 5 minutes both antegrade and retrograde.  A hotshot cardioplegia dose was delivered as part of cross-clamp removal. The posterior descending coronary artery and the obtuse marginal coronary artery were open.  Utilizing 2 separate pieces of reverse saphenous vein graft end-to-side anastomoses were constructed utilizing 7-0 Prolene sutures.  There flushed here and found hemostatic. The mammary artery was then anastomosed to the LAD in an end-to-side fashion utilizing 8-0 Prolene suture was flushed here and found be hemostatic the  pedicle was pexed the Anterior surface of the heart with 5-0 Prolene sutures. Aortic cross-clamp was removed and a partial clamp placed on the ascending aorta and the 2 proximal anastomoses were brought out through 4.0 mm aortic punches.  Proximal vein graft markers were  utilized.  Partial clamp was removed and the vein grafts were de-aired in routine fashion.  Proximal and distal anastomoses were examined and were hemostatic.  Ventricular and atrial pacing wires were placed and brought out through inferior stab wounds and secured. The patient was then weaned from cardiopulmonary bypass on inotropic support.  With adequate hemodynamics protamine was delivered the patient was decannulated and sites oversewn necessary.  Chest tubes were out the inferior stab was and secured. With adequate hemostasis the sternum was reapproximated with interrupted standstill wire and the presternal subcutaneous tissue and skin were closed multilayer's absorbable suture sterile dressings were applied.

## 2023-01-06 NOTE — Anesthesia Procedure Notes (Signed)
Procedure Name: Intubation Date/Time: 01/30/2023 5:37 PM  Performed by: Darryl Nestle, CRNAPre-anesthesia Checklist: Patient identified, Emergency Drugs available, Suction available and Patient being monitored Patient Re-evaluated:Patient Re-evaluated prior to induction Oxygen Delivery Method: Circle system utilized Preoxygenation: Pre-oxygenation with 100% oxygen Induction Type: IV induction Ventilation: Mask ventilation without difficulty and Oral airway inserted - appropriate to patient size Laryngoscope Size: Mac and 3 Grade View: Grade I Tube type: Oral Tube size: 8.0 mm Number of attempts: 1 Airway Equipment and Method: Stylet and Oral airway Placement Confirmation: ETT inserted through vocal cords under direct vision, positive ETCO2 and breath sounds checked- equal and bilateral Secured at: 22 cm Tube secured with: Tape Dental Injury: Teeth and Oropharynx as per pre-operative assessment

## 2023-01-06 NOTE — H&P (View-Only) (Signed)
Cardiology Consultation   Patient ID: Bianca Myers MRN: [id number]; DOB: [birth year]  Admit date: 01/18/2023 Date of Consult: 01/07/2023  PCP:  Everlean Cherry, MD   [facility] Providers Cardiologist:  Norman Herrlich, MD     Patient Profile:   Bianca Myers is a 77 y.o. female with a hx of ESRD on HD, DM, anemia, Jehovah witness who is being seen 01/10/2023 for the evaluation of chest pain/elevated troponin at the request of Dr. Dulce Sellar.  History of Present Illness:   Bianca Myers is a 77 year old female with past medical history noted above.  She presented to [facility] on 11/3 with complaints of significant shortness of breath and worsening dyspnea.  She was seen back in January 2014 by Dr. Dulce Sellar in the setting of paroxysmal atrial fibrillation.  She was hospitalized back in January 2024 with a colonic perforation post colonoscopy and required a sigmoid colectomy and colostomy.  She called EMS on 11/3 with complaints.  On arrival to the ED she was noted to be quite uncomfortable with difficulty breathing and required BiPAP as well as IV nitroglycerin and nebulizer treatments.  O2 sats were noted at 90% on room air chest x-ray at [facility] showed increased interstitial pulmonary markings with blunting of bilateral costophrenic angle suggestive of congestive heart failure.  Her labs showed sodium 136, potassium 3.8, proBNP 93,600, procalcitonin 0.87, WBC 11.4, hemoglobin 9.6, troponin I 0.05>>> 0.63>>> 1.13>>> 1.22.  Respiratory panel negative blood cultures negative EKG on admission showed sinus rhythm, 94 bpm, LVH, nonspecific changes.  Underwent echocardiogram 11/4 with LVEF of 55 to 60%, moderate concentric hypertrophy, normal RV, severely dilated left atrium, mildly dilated right atrium, moderate MR, moderate aortic stenosis with mean gradient 26 mmHg and aortic valve area of 1.05 cm, pulmonary artery systolic pressure of 61 mmHg.  She was evaluated by Dr.  Dulce Sellar with recommendations to proceed to [facility] for failure evaluation with cardiac catheterization.    Of note she was seen by nephrology while at [facility] and dialyzed on Sunday and Monday.  Admission H&P at [facility] notes history of CAD with prior bypass surgery but chest x-ray with no mention of sternal wires and no scar noted. She denies any hx of the same.   On arrival she denies any chest pain, reports her breathing has significantly improved.   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   left side   Depression    Diabetes mellitus (HCC)    Dyslipidemia 03/15/2021   Dyspnea    ESRD (end stage renal disease) on dialysis [facility])    Dialysis M/W/F   Gout    History of anemia due to chronic kidney disease    History of degenerative disc disease    Hypertension    Pneumonia    Refusal of blood transfusions as patient is Jehovah's Witness    Secondary hyperparathyroidism (HCC)    Spondylisthesis     Past Surgical History:  Procedure Laterality Date   A/V FISTULAGRAM Left 12/16/2018   Procedure: A/V FISTULAGRAM;  Surgeon: Cephus Shelling, MD;  Location: [facility] INVASIVE CV LAB;  Service: Cardiovascular;  Laterality: Left;   A/V FISTULAGRAM Left 06/13/2019   Procedure: A/V FISTULAGRAM;  Surgeon: Cephus Shelling, MD;  Location: [facility] INVASIVE CV LAB;  Service: Cardiovascular;  Laterality: Left;   AV FISTULA PLACEMENT Left 08/24/2018   Procedure: ARTERIOVENOUS (  AV) FISTULA CREATION;  Surgeon: Maeola Harman, MD;  Location: [facility] OR;  Service: Vascular;  Laterality: Left;   AV FISTULA PLACEMENT Left 07/12/2019   Procedure: INSERTION OF LEFT UPPER ARM  ARTERIOVENOUS (AV) GORE-TEX GRAFT;  Surgeon: Sherren Kerns, MD;  Location: [facility] OR;  Service: Vascular;  Laterality: Left;   BASCILIC VEIN TRANSPOSITION Left 11/04/2018   Procedure: BASCILIC VEIN TRANSPOSITION SECOND  STAGE;  Surgeon: Maeola Harman, MD;  Location: [facility] OR;  Service: Vascular;  Laterality: Left;   COLON SURGERY     COLONOSCOPY     FRACTURE SURGERY Left    wrist   HIP SURGERY     INSERTION OF DIALYSIS CATHETER N/A 06/15/2019   Procedure: INSERTION OF TUNNELED  DIALYSIS CATHETER RIGHT INTERNAL JUGULAR;  Surgeon: Nada Libman, MD;  Location: [facility] OR;  Service: Vascular;  Laterality: N/A;   IR FLUORO GUIDE CV LINE RIGHT  08/21/2018   IR FLUORO GUIDE CV LINE RIGHT  06/08/2019   IR PERC TUN PERIT CATH WO PORT S&I /IMAG  12/19/2019   IR REMOVAL TUN CV CATH W/O FL  03/11/2019   IR THROMBECTOMY AV FISTULA W/THROMBOLYSIS/PTA INC/SHUNT/IMG LEFT Left 12/21/2019   IR US GUIDE VASC ACCESS LEFT  12/21/2019   IR US GUIDE VASC ACCESS RIGHT  08/21/2018   IR US GUIDE VASC ACCESS RIGHT  06/08/2019   IR US GUIDE VASC ACCESS RIGHT  12/19/2019   PERIPHERAL VASCULAR BALLOON ANGIOPLASTY Left 06/13/2019   Procedure: PERIPHERAL VASCULAR BALLOON ANGIOPLASTY;  Surgeon: Cephus Shelling, MD;  Location: [facility] INVASIVE CV LAB;  Service: Cardiovascular;  Laterality: Left;  AVF   PERIPHERAL VASCULAR INTERVENTION Left 12/16/2018   Procedure: PERIPHERAL VASCULAR INTERVENTION;  Surgeon: Cephus Shelling, MD;  Location: [facility] INVASIVE CV LAB;  Service: Cardiovascular;  Laterality: Left;  arm fistula   REVISON OF ARTERIOVENOUS FISTULA Left 10/28/2022   Procedure: REPAIR ULCER OVER LEFT ARM ARTERIOVENOUS FISTULA;  Surgeon: Chuck Hint, MD;  Location: [facility] OR;  Service: Vascular;  Laterality: Left;   SHOULDER SURGERY Right 2021   due to a bone infection   TOTAL HIP ARTHROPLASTY Left 06/10/2019   Procedure: TOTAL HIP ARTHROPLASTY ANTERIOR APPROACH;  Surgeon: Tarry Kos, MD;  Location: [facility] OR;  Service: Orthopedics;  Laterality: Left;      Inpatient Medications: Scheduled Meds:  Continuous Infusions:  PRN Meds:   Allergies:    Allergies  Allergen Reactions   Pepcid [Famotidine] Other (See  Comments)    Mental status changes(Intolerance)   Omeprazole     Confusion (intolerance), Mental Status Changes    Social History:   Social History   Socioeconomic History   Marital status: Widowed    Spouse name: Not on file   Number of children: Not on file   Years of education: Not on file   Highest education level: Not on file  Occupational History   Occupation: retired  Tobacco Use   Smoking status: Former    Current packs/day: 0.00    Types: Cigarettes    Quit date: 1973    Years since quitting: 51.8   Smokeless tobacco: Never  Vaping Use   Vaping status: Never Used  Substance and Sexual Activity   Alcohol use: Yes    Comment: occ   Drug use: Never   Sexual activity: Not on file  Other Topics Concern   Not on file  Social History Narrative   Not on file   Social Determinants of Corporate investment banker  Strain: Low Risk  (04/08/2019)   Received from [facility] visits prior to 05/03/2022., [facility] visits prior to 05/03/2022.   Overall Financial Resource Strain (CARDIA)    Difficulty of Paying Living Expenses: Not hard at all  Food Insecurity: Low Risk  (08/14/2022)   Received from [facility]   Hunger Vital Sign    Worried About Running Out of Food in the Last Year: Never true    Ran Out of Food in the Last Year: Never true  Transportation Needs: Not on file (08/14/2022)  Recent Concern: Transportation Needs - Unmet Transportation Needs (08/14/2022)   Received from [employer]    In the past 12 months, has lack of reliable transportation kept you from medical appointments, meetings, work or from getting things needed for daily living? : Yes  Physical Activity: Not on file  Stress: Not on file  Social Connections: Not on file  Intimate Partner Violence: Not on file    Family History:    Family History  Family history unknown: Yes     ROS:  Please see the history of present illness.    All other ROS reviewed and negative.     Physical Exam/Data:  There were no vitals filed for this visit. No intake or output data in the 24 hours ending 01/12/2023 1544    10/28/2022    9:38 AM 10/24/2022    2:04 PM 01/18/2022   12:03 PM  Last 3 Weights  Weight (lbs) 140 lb 140 lb 140 lb  Weight (kg) 63.504 kg 63.504 kg 63.504 kg     There is no height or weight on file to calculate BMI.  General:  Well nourished, well developed, in no acute distress HEENT: normal Neck: no JVD Vascular: No carotid bruits; Distal pulses 2+ bilaterally Cardiac:  normal S1, S2; RRR; + systolic murmur right upper sternal border Lungs:  clear to auscultation bilaterally, no wheezing, rhonchi or rales  Abd: soft, nontender, no hepatomegaly  Ext: no edema Musculoskeletal:  No deformities, BUE and BLE strength normal and equal.  Left upper arm AV fistula Skin: warm and dry  Neuro:  CNs 2-12 intact, no focal abnormalities noted Psych:  Normal affect   EKG:  The EKG was personally reviewed and demonstrates: Initial EKG 11/3 sinus rhythm, 94 bpm, LVH, nonspecific changes  Relevant CV Studies:  Echo at [facility] noted above  Laboratory Data:  High Sensitivity Troponin:  No results for input(s): "TROPONINIHS" in the last 720 hours.   ChemistryNo results for input(s): "NA", "K", "CL", "CO2", "GLUCOSE", "BUN", "CREATININE", "CALCIUM", "MG", "GFRNONAA", "GFRAA", "ANIONGAP" in the last 168 hours.  No results for input(s): "PROT", "ALBUMIN", "AST", "ALT", "ALKPHOS", "BILITOT" in the last 168 hours. Lipids No results for input(s): "CHOL", "TRIG", "HDL", "LABVLDL", "LDLCALC", "CHOLHDL" in the last 168 hours.  HematologyNo results for input(s): "WBC", "RBC", "HGB", "HCT", "MCV", "MCH", "MCHC", "RDW", "PLT" in the last 168 hours. Thyroid No results for input(s): "TSH", "FREET4" in the last 168 hours.  BNPNo results for input(s): "BNP", "PROBNP" in the last 168 hours.  DDimer No results for input(s): "DDIMER" in  the last 168 hours.   Radiology/Studies:  No results found.   Assessment and Plan:   Bianca Myers is a 77 y.o. female with a hx of ESRD on HD, DM, anemia who is being seen 01/18/2023 for the evaluation of chest pain/elevated troponin at the request of Dr. Dulce Sellar.  Chest pain NSTEMI -- Presented  to [facility] with chest pain and dyspnea that started on Saturday.  Troponin I peaked at 1.22, EKG with nonspecific changes.  She denies any history of known CAD.  Given her risk factors it was recommended that she transfer to [facility] for further evaluation with definitive cardiac catheterization. -- On arrival she is pain-free -- Discussed cardiac catheterization with patient and she is willing to proceed -- further recommendations pending cath -- on ASA, Crestor 10 mg daily, carvedilol 3.125 mg twice daily (prior to transfer at [facility])  Informed Consent   Shared Decision Making/Informed Consent The risks [stroke (1 in 1000), death (1 in 1000), kidney failure [usually temporary] (1 in 500), bleeding (1 in 200), allergic reaction [possibly serious] (1 in 200)], benefits (diagnostic support and management of coronary artery disease) and alternatives of a cardiac catheterization were discussed in detail with Bianca Myers and she is willing to proceed.  Acute hypoxic respiratory failure -- Initially required BiPAP while at [facility], has undergone HD on Sunday and Monday since weaned to nasal cannula  -- Chest x-ray on admission with pulmonary edema -- Leukocytosis, procalcitonin 0.87 -- Blood cultures NTD, was treated with vancomycin  HFpEF -- Echocardiogram at [facility] with LVEF of 55 to 60% -- Volume management per HD  ESRD on HD -- On a Monday, Wednesday, Friday schedule -- Received dialysis on Sunday and Monday -- Per nephrology  Severe pulmonary hypertension -- Echo at [facility] with pulmonary artery pressure of 61 mmHg  Per  primary Anemia Diabetes Depression  Risk Assessment/Risk Scores:     TIMI Risk Score for Unstable Angina or Non-ST Elevation MI:   The patient's TIMI risk score is 5, which indicates a 26% risk of all cause mortality, new or recurrent myocardial infarction or need for urgent revascularization in the next 14 days.  New York Heart Association (NYHA) Functional Class NYHA Class II  For questions or updates, please contact [facility] Please consult [email or web address] for contact info under    Signed, Laverda Page, NP  01/13/2023 3:44 PM

## 2023-01-06 NOTE — Anesthesia Postprocedure Evaluation (Signed)
Anesthesia Post Note  Patient: Bianca Myers  Procedure(s) Performed: CORONARY ARTERY BYPASS GRAFTING (CABG) x3 USING LEFT INTERNAL MAMMARY ARTERY AND HARVESTED RIGHT GREATER SAPHENOUS VEIN ENDOSCOPICALLY (Chest) TRANSESOPHAGEAL ECHOCARDIOGRAM     Patient location during evaluation: SICU Anesthesia Type: General Level of consciousness: sedated Pain management: pain level controlled Vital Signs Assessment: post-procedure vital signs reviewed and stable Respiratory status: patient remains intubated per anesthesia plan Cardiovascular status: stable Postop Assessment: no apparent nausea or vomiting Anesthetic complications: no   No notable events documented.  Last Vitals:  Vitals:   01/22/2023 1645 01/02/2023 1650  BP: (!) 145/70 (!) 157/92  Pulse: 68 67  Resp: 15 15  SpO2: 100% 100%    Last Pain:  Vitals:   01/07/2023 1650  PainSc: 0-No pain                 Collene Schlichter

## 2023-01-06 NOTE — Anesthesia Preprocedure Evaluation (Signed)
Anesthesia Evaluation  Patient identified by MRN, date of birth, ID band Patient awake    Reviewed: Allergy & Precautions, NPO status , Patient's Chart, lab work & pertinent test results, reviewed documented beta blocker date and time Preop documentation limited or incomplete due to emergent nature of procedure.  Airway Mallampati: II  TM Distance: >3 FB Neck ROM: Full    Dental   Pulmonary shortness of breath, former smoker   Pulmonary exam normal breath sounds clear to auscultation       Cardiovascular hypertension, Pt. on home beta blockers and Pt. on medications + CAD and + Past MI  Normal cardiovascular exam Rhythm:Regular Rate:Normal     Neuro/Psych  PSYCHIATRIC DISORDERS  Depression       GI/Hepatic   Endo/Other  diabetes, Type 2    Renal/GU ESRF and DialysisRenal disease (MWF)     Musculoskeletal   Abdominal   Peds  Hematology  (+) Blood dyscrasia, anemia , REFUSES BLOOD PRODUCTS, JEHOVAH'S WITNESS  Anesthesia Other Findings   Reproductive/Obstetrics                             Anesthesia Physical Anesthesia Plan  ASA: 4 and emergent  Anesthesia Plan: General   Post-op Pain Management:    Induction: Intravenous  PONV Risk Score and Plan: 3 and Treatment may vary due to age or medical condition and Midazolam  Airway Management Planned: Oral ETT  Additional Equipment: Arterial line, CVP, PA Cath, TEE and Ultrasound Guidance Line Placement  Intra-op Plan:   Post-operative Plan: Post-operative intubation/ventilation  Informed Consent: I have reviewed the patients History and Physical, chart, labs and discussed the procedure including the risks, benefits and alternatives for the proposed anesthesia with the patient or authorized representative who has indicated his/her understanding and acceptance.     Only emergency history available  Plan Discussed with:  CRNA  Anesthesia Plan Comments: (Pre-op eval completed following induction of anesthesia due to emergent nature of procedure.)       Anesthesia Quick Evaluation

## 2023-01-06 NOTE — Brief Op Note (Signed)
01/12/2023  9:43 PM  PATIENT:  Bianca Myers  77 y.o. female  PRE-OPERATIVE DIAGNOSIS:  S/P CATH  POST-OPERATIVE DIAGNOSIS:  S/P CATH  PROCEDURE:   CORONARY ARTERY BYPASS GRAFTING (CABG) x 3 USING LEFT INTERNAL MAMMARY ARTERY AND HARVESTED RIGHT GREATER SAPHENOUS VEIN ENDOSCOPICALLY  TRANSESOPHAGEAL ECHOCARDIOGRAM  Vein harvest time: Vein prep time:  SURGEON:  Surgeons and Role:    Eugenio Hoes, MD - Primary  PHYSICIAN ASSISTANT: Aloha Gell PA-C  ASSISTANTS: Virgilio Frees RNFA   ANESTHESIA:   general  EBL:  BLOOD ADMINISTERED:none  DRAINS:  Mediastinal drains    LOCAL MEDICATIONS USED:  NONE  SPECIMEN:  No Specimen  DISPOSITION OF SPECIMEN:  N/A  COUNTS:  YES  DICTATION: .Dragon Dictation  PLAN OF CARE: Admit to inpatient   PATIENT DISPOSITION:  ICU - intubated and hemodynamically stable.   Delay start of Pharmacological VTE agent (>24hrs) due to surgical blood loss or risk of bleeding: yes

## 2023-01-07 ENCOUNTER — Inpatient Hospital Stay (HOSPITAL_COMMUNITY): Payer: Medicare HMO

## 2023-01-07 ENCOUNTER — Other Ambulatory Visit: Payer: Self-pay

## 2023-01-07 ENCOUNTER — Encounter (HOSPITAL_COMMUNITY): Payer: Self-pay | Admitting: Cardiology

## 2023-01-07 DIAGNOSIS — R4189 Other symptoms and signs involving cognitive functions and awareness: Secondary | ICD-10-CM | POA: Diagnosis not present

## 2023-01-07 DIAGNOSIS — R569 Unspecified convulsions: Secondary | ICD-10-CM

## 2023-01-07 DIAGNOSIS — N186 End stage renal disease: Secondary | ICD-10-CM | POA: Diagnosis not present

## 2023-01-07 DIAGNOSIS — D500 Iron deficiency anemia secondary to blood loss (chronic): Secondary | ICD-10-CM

## 2023-01-07 DIAGNOSIS — G9340 Encephalopathy, unspecified: Secondary | ICD-10-CM

## 2023-01-07 DIAGNOSIS — I25110 Atherosclerotic heart disease of native coronary artery with unstable angina pectoris: Secondary | ICD-10-CM | POA: Diagnosis not present

## 2023-01-07 DIAGNOSIS — Z951 Presence of aortocoronary bypass graft: Secondary | ICD-10-CM

## 2023-01-07 DIAGNOSIS — Z978 Presence of other specified devices: Secondary | ICD-10-CM

## 2023-01-07 DIAGNOSIS — J9601 Acute respiratory failure with hypoxia: Secondary | ICD-10-CM | POA: Diagnosis not present

## 2023-01-07 DIAGNOSIS — I2510 Atherosclerotic heart disease of native coronary artery without angina pectoris: Secondary | ICD-10-CM | POA: Diagnosis not present

## 2023-01-07 DIAGNOSIS — R079 Chest pain, unspecified: Secondary | ICD-10-CM

## 2023-01-07 LAB — BASIC METABOLIC PANEL
Anion gap: 13 (ref 5–15)
Anion gap: 15 (ref 5–15)
Anion gap: 19 — ABNORMAL HIGH (ref 5–15)
BUN: 42 mg/dL — ABNORMAL HIGH (ref 8–23)
BUN: 47 mg/dL — ABNORMAL HIGH (ref 8–23)
BUN: 40 mg/dL — ABNORMAL HIGH (ref 8–23)
CO2: 21 mmol/L — ABNORMAL LOW (ref 22–32)
CO2: 19 mmol/L — ABNORMAL LOW (ref 22–32)
CO2: 16 mmol/L — ABNORMAL LOW (ref 22–32)
Calcium: 8 mg/dL — ABNORMAL LOW (ref 8.9–10.3)
Calcium: 7.9 mg/dL — ABNORMAL LOW (ref 8.9–10.3)
Calcium: 8.1 mg/dL — ABNORMAL LOW (ref 8.9–10.3)
Chloride: 100 mmol/L (ref 98–111)
Chloride: 97 mmol/L — ABNORMAL LOW (ref 98–111)
Chloride: 100 mmol/L (ref 98–111)
Creatinine, Ser: 5.61 mg/dL — ABNORMAL HIGH (ref 0.44–1.00)
Creatinine, Ser: 6.13 mg/dL — ABNORMAL HIGH (ref 0.44–1.00)
Creatinine, Ser: 4.82 mg/dL — ABNORMAL HIGH (ref 0.44–1.00)
GFR, Estimated: 7 mL/min — ABNORMAL LOW (ref >60)
GFR, Estimated: 7 mL/min — ABNORMAL LOW (ref >60)
GFR, Estimated: 9 mL/min — ABNORMAL LOW (ref >60)
Glucose, Bld: 130 mg/dL — ABNORMAL HIGH (ref 70–99)
Glucose, Bld: 181 mg/dL — ABNORMAL HIGH (ref 70–99)
Glucose, Bld: 165 mg/dL — ABNORMAL HIGH (ref 70–99)
Potassium: 7.2 mmol/L (ref 3.5–5.1)
Potassium: 7.1 mmol/L (ref 3.5–5.1)
Potassium: 6 mmol/L — ABNORMAL HIGH (ref 3.5–5.1)
Sodium: 134 mmol/L — ABNORMAL LOW (ref 135–145)
Sodium: 131 mmol/L — ABNORMAL LOW (ref 135–145)
Sodium: 135 mmol/L (ref 135–145)

## 2023-01-07 LAB — POCT I-STAT 7, (LYTES, BLD GAS, ICA,H+H)
Acid-base deficit: 10 mmol/L — ABNORMAL HIGH (ref 0.0–2.0)
Acid-base deficit: 3 mmol/L — ABNORMAL HIGH (ref 0.0–2.0)
Acid-base deficit: 4 mmol/L — ABNORMAL HIGH (ref 0.0–2.0)
Acid-base deficit: 4 mmol/L — ABNORMAL HIGH (ref 0.0–2.0)
Acid-base deficit: 4 mmol/L — ABNORMAL HIGH (ref 0.0–2.0)
Acid-base deficit: 5 mmol/L — ABNORMAL HIGH (ref 0.0–2.0)
Acid-base deficit: 5 mmol/L — ABNORMAL HIGH (ref 0.0–2.0)
Acid-base deficit: 8 mmol/L — ABNORMAL HIGH (ref 0.0–2.0)
Bicarbonate: 15.6 mmol/L — ABNORMAL LOW (ref 20.0–28.0)
Bicarbonate: 19.2 mmol/L — ABNORMAL LOW (ref 20.0–28.0)
Bicarbonate: 20.5 mmol/L (ref 20.0–28.0)
Bicarbonate: 21.1 mmol/L (ref 20.0–28.0)
Bicarbonate: 21.3 mmol/L (ref 20.0–28.0)
Bicarbonate: 21.7 mmol/L (ref 20.0–28.0)
Bicarbonate: 22.6 mmol/L (ref 20.0–28.0)
Bicarbonate: 22.9 mmol/L (ref 20.0–28.0)
Calcium, Ion: 1.1 mmol/L — ABNORMAL LOW (ref 1.15–1.40)
Calcium, Ion: 1.1 mmol/L — ABNORMAL LOW (ref 1.15–1.40)
Calcium, Ion: 1.13 mmol/L — ABNORMAL LOW (ref 1.15–1.40)
Calcium, Ion: 1.15 mmol/L (ref 1.15–1.40)
Calcium, Ion: 1.15 mmol/L (ref 1.15–1.40)
Calcium, Ion: 1.15 mmol/L (ref 1.15–1.40)
Calcium, Ion: 1.15 mmol/L (ref 1.15–1.40)
Calcium, Ion: 1.22 mmol/L (ref 1.15–1.40)
HCT: 18 % — ABNORMAL LOW (ref 36.0–46.0)
HCT: 21 % — ABNORMAL LOW (ref 36.0–46.0)
HCT: 22 % — ABNORMAL LOW (ref 36.0–46.0)
HCT: 22 % — ABNORMAL LOW (ref 36.0–46.0)
HCT: 22 % — ABNORMAL LOW (ref 36.0–46.0)
HCT: 23 % — ABNORMAL LOW (ref 36.0–46.0)
HCT: 24 % — ABNORMAL LOW (ref 36.0–46.0)
HCT: 24 % — ABNORMAL LOW (ref 36.0–46.0)
Hemoglobin: 6.1 g/dL — CL (ref 12.0–15.0)
Hemoglobin: 7.1 g/dL — ABNORMAL LOW (ref 12.0–15.0)
Hemoglobin: 7.5 g/dL — ABNORMAL LOW (ref 12.0–15.0)
Hemoglobin: 7.5 g/dL — ABNORMAL LOW (ref 12.0–15.0)
Hemoglobin: 7.5 g/dL — ABNORMAL LOW (ref 12.0–15.0)
Hemoglobin: 7.8 g/dL — ABNORMAL LOW (ref 12.0–15.0)
Hemoglobin: 8.2 g/dL — ABNORMAL LOW (ref 12.0–15.0)
Hemoglobin: 8.2 g/dL — ABNORMAL LOW (ref 12.0–15.0)
O2 Saturation: 100 %
O2 Saturation: 100 %
O2 Saturation: 100 %
O2 Saturation: 100 %
O2 Saturation: 89 %
O2 Saturation: 99 %
O2 Saturation: 99 %
O2 Saturation: 99 %
Patient temperature: 35.4
Patient temperature: 36.1
Patient temperature: 36.2
Patient temperature: 36.5
Patient temperature: 36.8
Patient temperature: 36.9
Patient temperature: 37
Patient temperature: 37
Potassium: 6.2 mmol/L — ABNORMAL HIGH (ref 3.5–5.1)
Potassium: 6.5 mmol/L (ref 3.5–5.1)
Potassium: 6.5 mmol/L (ref 3.5–5.1)
Potassium: 6.7 mmol/L (ref 3.5–5.1)
Potassium: 6.8 mmol/L (ref 3.5–5.1)
Potassium: 7.7 mmol/L (ref 3.5–5.1)
Potassium: 8 mmol/L (ref 3.5–5.1)
Potassium: 8 mmol/L (ref 3.5–5.1)
Sodium: 129 mmol/L — ABNORMAL LOW (ref 135–145)
Sodium: 130 mmol/L — ABNORMAL LOW (ref 135–145)
Sodium: 131 mmol/L — ABNORMAL LOW (ref 135–145)
Sodium: 131 mmol/L — ABNORMAL LOW (ref 135–145)
Sodium: 131 mmol/L — ABNORMAL LOW (ref 135–145)
Sodium: 132 mmol/L — ABNORMAL LOW (ref 135–145)
Sodium: 132 mmol/L — ABNORMAL LOW (ref 135–145)
Sodium: 133 mmol/L — ABNORMAL LOW (ref 135–145)
TCO2: 17 mmol/L — ABNORMAL LOW (ref 22–32)
TCO2: 20 mmol/L — ABNORMAL LOW (ref 22–32)
TCO2: 22 mmol/L (ref 22–32)
TCO2: 22 mmol/L (ref 22–32)
TCO2: 23 mmol/L (ref 22–32)
TCO2: 23 mmol/L (ref 22–32)
TCO2: 24 mmol/L (ref 22–32)
TCO2: 24 mmol/L (ref 22–32)
pCO2 arterial: 30.9 mm[Hg] — ABNORMAL LOW (ref 32–48)
pCO2 arterial: 32.3 mm[Hg] (ref 32–48)
pCO2 arterial: 40.5 mm[Hg] (ref 32–48)
pCO2 arterial: 41.1 mm[Hg] (ref 32–48)
pCO2 arterial: 41.9 mm[Hg] (ref 32–48)
pCO2 arterial: 42.1 mm[Hg] (ref 32–48)
pCO2 arterial: 45.3 mm[Hg] (ref 32–48)
pCO2 arterial: 62.2 mm[Hg] — ABNORMAL HIGH (ref 32–48)
pH, Arterial: 7.125 — CL (ref 7.35–7.45)
pH, Arterial: 7.287 — ABNORMAL LOW (ref 7.35–7.45)
pH, Arterial: 7.303 — ABNORMAL LOW (ref 7.35–7.45)
pH, Arterial: 7.319 — ABNORMAL LOW (ref 7.35–7.45)
pH, Arterial: 7.323 — ABNORMAL LOW (ref 7.35–7.45)
pH, Arterial: 7.324 — ABNORMAL LOW (ref 7.35–7.45)
pH, Arterial: 7.338 — ABNORMAL LOW (ref 7.35–7.45)
pH, Arterial: 7.399 (ref 7.35–7.45)
pO2, Arterial: 157 mm[Hg] — ABNORMAL HIGH (ref 83–108)
pO2, Arterial: 163 mm[Hg] — ABNORMAL HIGH (ref 83–108)
pO2, Arterial: 173 mm[Hg] — ABNORMAL HIGH (ref 83–108)
pO2, Arterial: 203 mm[Hg] — ABNORMAL HIGH (ref 83–108)
pO2, Arterial: 210 mm[Hg] — ABNORMAL HIGH (ref 83–108)
pO2, Arterial: 230 mm[Hg] — ABNORMAL HIGH (ref 83–108)
pO2, Arterial: 508 mm[Hg] — ABNORMAL HIGH (ref 83–108)
pO2, Arterial: 75 mm[Hg] — ABNORMAL LOW (ref 83–108)

## 2023-01-07 LAB — COOXEMETRY PANEL
Carboxyhemoglobin: 0.3 % — ABNORMAL LOW (ref 0.5–1.5)
Methemoglobin: <0.7 % (ref 0.0–1.5)
O2 Saturation: 58.5 %
Total hemoglobin: <6.9 g/dL — CL (ref 12.0–16.0)

## 2023-01-07 LAB — MAGNESIUM
Magnesium: 2.4 mg/dL (ref 1.7–2.4)
Magnesium: 2.4 mg/dL (ref 1.7–2.4)

## 2023-01-07 LAB — MRSA NEXT GEN BY PCR, NASAL: MRSA by PCR Next Gen: NOT DETECTED

## 2023-01-07 LAB — GLUCOSE, CAPILLARY
Glucose-Capillary: 101 mg/dL — ABNORMAL HIGH (ref 70–99)
Glucose-Capillary: 102 mg/dL — ABNORMAL HIGH (ref 70–99)
Glucose-Capillary: 106 mg/dL — ABNORMAL HIGH (ref 70–99)
Glucose-Capillary: 122 mg/dL — ABNORMAL HIGH (ref 70–99)
Glucose-Capillary: 126 mg/dL — ABNORMAL HIGH (ref 70–99)
Glucose-Capillary: 136 mg/dL — ABNORMAL HIGH (ref 70–99)
Glucose-Capillary: 144 mg/dL — ABNORMAL HIGH (ref 70–99)
Glucose-Capillary: 161 mg/dL — ABNORMAL HIGH (ref 70–99)
Glucose-Capillary: 161 mg/dL — ABNORMAL HIGH (ref 70–99)
Glucose-Capillary: 65 mg/dL — ABNORMAL LOW (ref 70–99)
Glucose-Capillary: 92 mg/dL (ref 70–99)
Glucose-Capillary: 93 mg/dL (ref 70–99)
Glucose-Capillary: 94 mg/dL (ref 70–99)
Glucose-Capillary: 99 mg/dL (ref 70–99)

## 2023-01-07 LAB — AMMONIA: Ammonia: 46 umol/L — ABNORMAL HIGH (ref 9–35)

## 2023-01-07 MED ORDER — PRISMASOL BGK 4/2.5 32-4-2.5 MEQ/L REPLACEMENT SOLN: Status: DC

## 2023-01-07 MED ORDER — SODIUM BICARBONATE 8.4 % IV SOLN: 50.0000 meq | Freq: Once | INTRAVENOUS | Status: AC

## 2023-01-07 MED ORDER — STROKE: EARLY STAGES OF RECOVERY BOOK
Freq: Once | Status: AC
  Filled 2023-01-07: qty 1

## 2023-01-07 MED ORDER — CALCIUM GLUCONATE-NACL 1-0.675 GM/50ML-% IV SOLN
INTRAVENOUS | Status: AC
  Administered 2023-01-07: 1000 mg via INTRAVENOUS
  Filled 2023-01-07: qty 50

## 2023-01-07 MED ORDER — SODIUM CHLORIDE 0.9 % FOR CRRT: INTRAVENOUS_CENTRAL | Status: DC | PRN

## 2023-01-07 MED ORDER — PROPOFOL 10 MG/ML IV BOLUS
INTRAVENOUS | Status: AC
  Filled 2023-01-07: qty 20

## 2023-01-07 MED ORDER — ALBUTEROL SULFATE (2.5 MG/3ML) 0.083% IN NEBU
10.0000 mg | INHALATION_SOLUTION | Freq: Once | RESPIRATORY_TRACT | Status: AC
  Administered 2023-01-07: 10 mg via RESPIRATORY_TRACT
  Filled 2023-01-07: qty 12

## 2023-01-07 MED ORDER — LIDOCAINE HCL (PF) 1 % IJ SOLN: 5.0000 mL | INTRAMUSCULAR | Status: DC | PRN

## 2023-01-07 MED ORDER — DEXTROSE 50 % IV SOLN
INTRAVENOUS | Status: AC
  Administered 2023-01-07: 50 mL via INTRAVENOUS
  Filled 2023-01-07: qty 50

## 2023-01-07 MED ORDER — INSULIN ASPART 100 UNIT/ML IV SOLN
5.0000 [IU] | Freq: Once | INTRAVENOUS | Status: AC
  Administered 2023-01-07: 5 [IU] via INTRAVENOUS

## 2023-01-07 MED ORDER — SODIUM BICARBONATE 8.4 % IV SOLN
50.0000 meq | Freq: Once | INTRAVENOUS | Status: AC
  Administered 2023-01-07: 50 meq via INTRAVENOUS

## 2023-01-07 MED ORDER — INSULIN ASPART 100 UNIT/ML IJ SOLN: 0.0000 [IU] | INTRAMUSCULAR | Status: DC

## 2023-01-07 MED ORDER — OXYCODONE HCL 5 MG/5ML PO SOLN: 5.0000 mg | ORAL | Status: DC | PRN

## 2023-01-07 MED ORDER — POLYETHYLENE GLYCOL 3350 17 G PO PACK: 17.0000 g | PACK | Freq: Every day | ORAL | Status: DC

## 2023-01-07 MED ORDER — ATORVASTATIN CALCIUM 10 MG PO TABS: 10.0000 mg | ORAL_TABLET | Freq: Every evening | ORAL | Status: DC

## 2023-01-07 MED ORDER — LIDOCAINE-PRILOCAINE 2.5-2.5 % EX CREA: 1.0000 | TOPICAL_CREAM | CUTANEOUS | Status: DC | PRN

## 2023-01-07 MED ORDER — FENTANYL CITRATE PF 50 MCG/ML IJ SOSY
25.0000 ug | PREFILLED_SYRINGE | INTRAMUSCULAR | Status: DC | PRN
  Filled 2023-01-07: qty 1

## 2023-01-07 MED ORDER — IOHEXOL 350 MG/ML SOLN
100.0000 mL | Freq: Once | INTRAVENOUS | Status: AC | PRN
  Administered 2023-01-07: 100 mL via INTRAVENOUS

## 2023-01-07 MED ORDER — CALCIUM GLUCONATE-NACL 1-0.675 GM/50ML-% IV SOLN
1.0000 g | INTRAVENOUS | Status: AC
  Administered 2023-01-07: 1000 mg via INTRAVENOUS
  Filled 2023-01-07: qty 50

## 2023-01-07 MED ORDER — ARTIFICIAL TEARS OPHTHALMIC OINT
TOPICAL_OINTMENT | OPHTHALMIC | Status: DC | PRN
  Administered 2023-01-07 – 2023-01-08 (×2): 1 via OPHTHALMIC
  Filled 2023-01-07: qty 3.5

## 2023-01-07 MED ORDER — DOCUSATE SODIUM 50 MG/5ML PO LIQD
100.0000 mg | Freq: Two times a day (BID) | ORAL | Status: DC
  Filled 2023-01-07: qty 10

## 2023-01-07 MED ORDER — DEXTROSE-SODIUM CHLORIDE 5-0.9 % IV SOLN: INTRAVENOUS | Status: AC

## 2023-01-07 MED ORDER — AMIODARONE HCL IN DEXTROSE 360-4.14 MG/200ML-% IV SOLN
30.0000 mg/h | INTRAVENOUS | Status: DC
  Administered 2023-01-07 – 2023-01-08 (×3): 30 mg/h via INTRAVENOUS
  Filled 2023-01-07 (×3): qty 200

## 2023-01-07 MED ORDER — TRAMADOL HCL 50 MG PO TABS: 50.0000 mg | ORAL_TABLET | ORAL | Status: DC | PRN

## 2023-01-07 MED ORDER — ARTIFICIAL TEARS OPHTHALMIC OINT
TOPICAL_OINTMENT | Freq: Every evening | OPHTHALMIC | Status: DC | PRN
  Filled 2023-01-07: qty 3.5

## 2023-01-07 MED ORDER — DEXTROSE 50 % IV SOLN: 1.0000 | Freq: Once | INTRAVENOUS | Status: AC

## 2023-01-07 MED ORDER — EPINEPHRINE HCL 5 MG/250ML IV SOLN IN NS
0.5000 ug/min | INTRAVENOUS | Status: DC
  Administered 2023-01-07: 2 ug/min via INTRAVENOUS
  Administered 2023-01-08: 10 ug/min via INTRAVENOUS
  Filled 2023-01-07: qty 250

## 2023-01-07 MED ORDER — PENTAFLUOROPROP-TETRAFLUOROETH EX AERO: 1.0000 | INHALATION_SPRAY | CUTANEOUS | Status: DC | PRN

## 2023-01-07 MED ORDER — SUCCINYLCHOLINE CHLORIDE 200 MG/10ML IV SOSY
PREFILLED_SYRINGE | INTRAVENOUS | Status: AC
  Filled 2023-01-07: qty 10

## 2023-01-07 MED ORDER — CALCIUM GLUCONATE-NACL 2-0.675 GM/100ML-% IV SOLN
2.0000 g | Freq: Once | INTRAVENOUS | Status: AC
Start: 2023-01-07 — End: 2023-01-07
  Administered 2023-01-07: 2000 mg via INTRAVENOUS
  Filled 2023-01-07: qty 100

## 2023-01-07 MED ORDER — FENTANYL CITRATE PF 50 MCG/ML IJ SOSY
PREFILLED_SYRINGE | INTRAMUSCULAR | Status: AC
  Filled 2023-01-07: qty 2

## 2023-01-07 MED ORDER — INSULIN ASPART 100 UNIT/ML IJ SOLN
0.0000 [IU] | INTRAMUSCULAR | Status: DC
  Administered 2023-01-07: 2 [IU] via SUBCUTANEOUS

## 2023-01-07 MED ORDER — SODIUM BICARBONATE 8.4 % IV SOLN
INTRAVENOUS | Status: AC
  Administered 2023-01-07: 50 meq via INTRAVENOUS
  Filled 2023-01-07: qty 50

## 2023-01-07 MED ORDER — MIDAZOLAM HCL 2 MG/2ML IJ SOLN
INTRAMUSCULAR | Status: AC
  Filled 2023-01-07: qty 2

## 2023-01-07 MED ORDER — AMIODARONE HCL IN DEXTROSE 360-4.14 MG/200ML-% IV SOLN
60.0000 mg/h | INTRAVENOUS | Status: AC
  Administered 2023-01-07: 60 mg/h via INTRAVENOUS
  Filled 2023-01-07: qty 200

## 2023-01-07 MED ORDER — FENTANYL CITRATE PF 50 MCG/ML IJ SOSY
25.0000 ug | PREFILLED_SYRINGE | INTRAMUSCULAR | Status: DC | PRN
  Administered 2023-01-08: 25 ug via INTRAVENOUS

## 2023-01-07 MED ORDER — INSULIN ASPART 100 UNIT/ML IV SOLN
10.0000 [IU] | Freq: Once | INTRAVENOUS | Status: AC
  Administered 2023-01-07: 10 [IU] via INTRAVENOUS

## 2023-01-07 MED ORDER — NALOXONE HCL 0.4 MG/ML IJ SOLN
INTRAMUSCULAR | Status: AC
  Filled 2023-01-07: qty 1

## 2023-01-07 MED ORDER — SODIUM ZIRCONIUM CYCLOSILICATE 5 G PO PACK
5.0000 g | PACK | Freq: Once | ORAL | Status: DC
  Filled 2023-01-07: qty 1

## 2023-01-07 MED ORDER — ROCURONIUM BROMIDE 10 MG/ML (PF) SYRINGE
PREFILLED_SYRINGE | INTRAVENOUS | Status: AC
  Filled 2023-01-07: qty 10

## 2023-01-07 MED ORDER — PRISMASOL BGK 4/2.5 32-4-2.5 MEQ/L EC SOLN: Status: DC

## 2023-01-07 MED ORDER — DARBEPOETIN ALFA 200 MCG/0.4ML IJ SOSY
200.0000 ug | PREFILLED_SYRINGE | INTRAMUSCULAR | Status: DC
  Administered 2023-01-07: 200 ug via SUBCUTANEOUS
  Filled 2023-01-07: qty 0.4

## 2023-01-07 MED ORDER — KETOROLAC TROMETHAMINE 15 MG/ML IJ SOLN
15.0000 mg | Freq: Once | INTRAMUSCULAR | Status: AC
  Administered 2023-01-07: 15 mg via INTRAVENOUS
  Filled 2023-01-07: qty 1

## 2023-01-07 MED ORDER — KETAMINE HCL 50 MG/5ML IJ SOSY
PREFILLED_SYRINGE | INTRAMUSCULAR | Status: AC
  Filled 2023-01-07: qty 10

## 2023-01-07 MED ORDER — CHLORHEXIDINE GLUCONATE CLOTH 2 % EX PADS
6.0000 | MEDICATED_PAD | Freq: Every day | CUTANEOUS | Status: DC
  Administered 2023-01-07 – 2023-01-08 (×3): 6 via TOPICAL

## 2023-01-07 MED ORDER — AMIODARONE LOAD VIA INFUSION
150.0000 mg | Freq: Once | INTRAVENOUS | Status: AC
  Administered 2023-01-07: 150 mg via INTRAVENOUS
  Filled 2023-01-07: qty 83.34

## 2023-01-07 MED ORDER — ETOMIDATE 2 MG/ML IV SOLN
INTRAVENOUS | Status: AC
  Filled 2023-01-07: qty 20

## 2023-01-07 MED ORDER — ORAL CARE MOUTH RINSE
15.0000 mL | OROMUCOSAL | Status: DC
  Administered 2023-01-07 – 2023-01-08 (×10): 15 mL via OROMUCOSAL

## 2023-01-07 MED ORDER — SODIUM CHLORIDE 0.9 % IV BOLUS: 500.0000 mL | Freq: Once | INTRAVENOUS | Status: AC

## 2023-01-07 MED ORDER — CALCIUM GLUCONATE-NACL 2-0.675 GM/100ML-% IV SOLN
2.0000 g | Freq: Once | INTRAVENOUS | Status: DC
  Filled 2023-01-07: qty 100

## 2023-01-07 MED ORDER — NOREPINEPHRINE 16 MG/250ML-% IV SOLN
0.0000 ug/min | INTRAVENOUS | Status: DC
  Administered 2023-01-07: 8 ug/min via INTRAVENOUS
  Administered 2023-01-08: 19 ug/min via INTRAVENOUS
  Filled 2023-01-07 (×3): qty 250

## 2023-01-07 MED ORDER — ALBUMIN HUMAN 25 % IV SOLN
25.0000 g | Freq: Once | INTRAVENOUS | Status: AC
  Administered 2023-01-07: 25 g via INTRAVENOUS
  Filled 2023-01-07: qty 100

## 2023-01-07 MED ORDER — HEPARIN SODIUM (PORCINE) 1000 UNIT/ML DIALYSIS: 1000.0000 [IU] | INTRAMUSCULAR | Status: DC | PRN

## 2023-01-07 MED ORDER — ORAL CARE MOUTH RINSE: 15.0000 mL | OROMUCOSAL | Status: DC | PRN

## 2023-01-07 MED FILL — Heparin Sodium (Porcine) Inj 1000 Unit/ML: Qty: 1000 | Status: AC

## 2023-01-07 MED FILL — Magnesium Sulfate Inj 50%: INTRAMUSCULAR | Qty: 10 | Status: AC

## 2023-01-07 MED FILL — Potassium Chloride Inj 2 mEq/ML: INTRAVENOUS | Qty: 40 | Status: AC

## 2023-01-07 NOTE — Consult Note (Signed)
NEUROLOGY CONSULT NOTE   Date of service: January 07, 2023 Patient Name: Bianca Myers MRN:  [id number] DOB:  February 03, 1946 Chief Complaint: "Code Stroke" Requesting Provider: Eugenio Hoes, MD  History of Present Illness  Bianca Myers is a 77 y.o. 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 Who initially presented with 10 days of worsening chest pain. She was transferred from [place] on the 3rd and then had a cath that showed critical left main stenosis and she was then taken for a CABG overnight on the 5th. She was extubated this morning, not oriented, but moving all extremities. She had an episode of afib with RVR and then went unresponsive. A code stroke was called, on our arrival she is on bipap, not following commands. Eyes are dysconjugate, right pupil slightly larger than left. No withdrawal to pain but she does grimace. Nods head inconsistently.    LKW: 11/5 Modified rankin score: 1-No significant post stroke disability and can perform usual duties with stroke symptoms IV Thrombolysis:  No, s/p CABGx3 on 11/5 EVT:  No, no LVO   1a Level of Conscious.: 1 1b LOC Questions: 2 1c LOC Commands: 2 2 Best Gaze: 0 3 Visual: 0 4 Facial Palsy: 0 5a Motor Arm - left: 4 5b Motor Arm - Right: 4 6a Motor Leg - Left: 4 6b Motor Leg - Right: 4 7 Limb Ataxia: 0 8 Sensory: 0 9 Best Language: 3 10 Dysarthria: 2 11 Extinct. and Inatten.: 0 TOTAL: 29   ROS   Unable to ascertain due to intubated, not answering questions correctly   Past History    Past Medical History:  Diagnosis Date   Anemia    Arthritis    Bowel perforation (HCC) 2024   Colostomy in place [facility]) 2024   left side   Depression    Diabetes mellitus (HCC)    Dyslipidemia 03/15/2021   Dyspnea    ESRD (end stage renal disease) on dialysis [facility])    Dialysis M/W/F   Gout    History of anemia due to chronic kidney disease    History of degenerative disc disease    Hypertension    Pneumonia    Refusal of blood transfusions as patient is Jehovah's Witness    Secondary hyperparathyroidism (HCC)    Spondylisthesis     Past Surgical History:  Procedure Laterality Date   A/V FISTULAGRAM Left 12/16/2018   Procedure: A/V FISTULAGRAM;  Surgeon: Cephus Shelling, MD;  Location: [facility] INVASIVE CV LAB;  Service: Cardiovascular;  Laterality: Left;   A/V FISTULAGRAM Left 06/13/2019   Procedure: A/V FISTULAGRAM;  Surgeon: Cephus Shelling, MD;  Location: [facility] INVASIVE CV LAB;  Service: Cardiovascular;  Laterality: Left;   AV FISTULA PLACEMENT Left 08/24/2018   Procedure: ARTERIOVENOUS (AV) FISTULA CREATION;  Surgeon: Maeola Harman, MD;  Location: [facility] OR;  Service: Vascular;  Laterality: Left;   AV FISTULA PLACEMENT Left 07/12/2019   Procedure: INSERTION OF LEFT UPPER ARM  ARTERIOVENOUS (AV) GORE-TEX GRAFT;  Surgeon: Sherren Kerns, MD;  Location: [facility] OR;  Service: Vascular;  Laterality: Left;   BASCILIC VEIN TRANSPOSITION Left 11/04/2018   Procedure: BASCILIC VEIN TRANSPOSITION SECOND STAGE;  Surgeon:  Maeola Harman, MD;  Location: [facility] OR;  Service: Vascular;  Laterality: Left;   COLON SURGERY     COLONOSCOPY     FRACTURE SURGERY Left    wrist   HIP SURGERY     INSERTION OF DIALYSIS CATHETER N/A 06/15/2019   Procedure: INSERTION OF TUNNELED  DIALYSIS CATHETER RIGHT INTERNAL JUGULAR;  Surgeon: Nada Libman, MD;  Location: [facility] OR;  Service: Vascular;  Laterality: N/A;   IR FLUORO GUIDE CV LINE RIGHT  08/21/2018   IR FLUORO GUIDE CV LINE  RIGHT  06/08/2019   IR PERC TUN PERIT CATH WO PORT S&I /IMAG  12/19/2019   IR REMOVAL TUN CV CATH W/O FL  03/11/2019   IR THROMBECTOMY AV FISTULA W/THROMBOLYSIS/PTA INC/SHUNT/IMG LEFT Left 12/21/2019   IR US GUIDE VASC ACCESS LEFT  12/21/2019   IR US GUIDE VASC ACCESS RIGHT  08/21/2018   IR US GUIDE VASC ACCESS RIGHT  06/08/2019   IR US GUIDE VASC ACCESS RIGHT  12/19/2019   LEFT HEART CATH AND CORONARY ANGIOGRAPHY N/A 01/14/2023   Procedure: LEFT HEART CATH AND CORONARY ANGIOGRAPHY;  Surgeon: Marykay Lex, MD;  Location: [facility] INVASIVE CV LAB;  Service: Cardiovascular;  Laterality: N/A;   PERIPHERAL VASCULAR BALLOON ANGIOPLASTY Left 06/13/2019   Procedure: PERIPHERAL VASCULAR BALLOON ANGIOPLASTY;  Surgeon: Cephus Shelling, MD;  Location: [facility] INVASIVE CV LAB;  Service: Cardiovascular;  Laterality: Left;  AVF   PERIPHERAL VASCULAR INTERVENTION Left 12/16/2018   Procedure: PERIPHERAL VASCULAR INTERVENTION;  Surgeon: Cephus Shelling, MD;  Location: [facility] INVASIVE CV LAB;  Service: Cardiovascular;  Laterality: Left;  arm fistula   REVISON OF ARTERIOVENOUS FISTULA Left 10/28/2022   Procedure: REPAIR ULCER OVER LEFT ARM ARTERIOVENOUS FISTULA;  Surgeon: Chuck Hint, MD;  Location: [facility] OR;  Service: Vascular;  Laterality: Left;   SHOULDER SURGERY Right 2021   due to a bone infection   TOTAL HIP ARTHROPLASTY Left 06/10/2019   Procedure: TOTAL HIP ARTHROPLASTY ANTERIOR APPROACH;  Surgeon: Tarry Kos, MD;  Location: [facility] OR;  Service: Orthopedics;  Laterality: Left;    Family History: Family History  Family history unknown: Yes    Social History  reports that she quit smoking about 51 years ago. Her smoking use included cigarettes. She has never used smokeless tobacco. She reports current alcohol use. She reports that she does not use drugs.  Allergies  Allergen Reactions   Pepcid [Famotidine] Other (See Comments)    Mental status changes(Intolerance)   Omeprazole     Confusion  (intolerance), Mental Status Changes    Medications   Current Facility-Administered Medications:    0.45 % sodium chloride infusion, , Intravenous, Continuous PRN, Stehler, Bailey C, PA-C   0.9 %  sodium chloride infusion, 250 mL, Intravenous, Continuous, Stehler, Bailey C, PA-C   0.9 %  sodium chloride infusion, , Intravenous, Continuous, Stehler, Oren Bracket, PA-C, Last Rate: 10 mL/hr at 01/07/23 1200, Infusion Verify at 01/07/23 1200   acetaminophen (TYLENOL) tablet 1,000 mg, 1,000 mg, Oral, Q6H **OR** acetaminophen (TYLENOL) 160 MG/5ML solution 1,000 mg, 1,000 mg, Per Tube, Q6H, Stehler, Bailey C, PA-C, 1,000 mg at 01/07/23 0507   albumin human 5 % solution 12.5 g, 12.5 g, Intravenous, Q15 min PRN, Stehler, Bailey C, PA-C   albuterol (PROVENTIL) (2.5 MG/3ML) 0.083% nebulizer solution 10 mg, 10 mg, Nebulization, Once, Bowser, Grace E, NP   albuterol (PROVENTIL) (2.5 MG/3ML) 0.083% nebulizer solution 3 mL, 3 mL, Inhalation, Q4H PRN, Stehler, Oren Bracket, PA-C   [COMPLETED]  amiodarone (NEXTERONE) 1.8 mg/mL load via infusion 150 mg, 150 mg, Intravenous, Once, 150 mg at 01/07/23 1158 **FOLLOWED BY** amiodarone (NEXTERONE PREMIX) 360-4.14 MG/200ML-% (1.8 mg/mL) IV infusion, 60 mg/hr, Intravenous, Continuous **FOLLOWED BY** amiodarone (NEXTERONE PREMIX) 360-4.14 MG/200ML-% (1.8 mg/mL) IV infusion, 30 mg/hr, Intravenous, Continuous, Eugenio Hoes, MD   amiodarone (PACERONE) tablet 400 mg, 400 mg, Oral, BID, Stehler, Oren Bracket, PA-C   aspirin EC tablet 325 mg, 325 mg, Oral, Daily **OR** aspirin chewable tablet 324 mg, 324 mg, Per Tube, Daily, Stehler, Bailey C, PA-C   atorvastatin (LIPITOR) tablet 10 mg, 10 mg, Oral, QPM, Stehler, Bailey C, PA-C   bisacodyl (DULCOLAX) EC tablet 10 mg, 10 mg, Oral, Daily **OR** bisacodyl (DULCOLAX) suppository 10 mg, 10 mg, Rectal, Daily, Stehler, Bailey C, PA-C   ceFAZolin (ANCEF) IVPB 2g/100 mL premix, 2 g, Intravenous, Once, Stehler, Bailey C, PA-C   Chlorhexidine Gluconate  Cloth 2 % PADS 6 each, 6 each, Topical, Daily, Eugenio Hoes, MD, 6 each at 01/07/23 1054   Darbepoetin Alfa (ARANESP) injection 200 mcg, 200 mcg, Subcutaneous, Q Wed-1800, Allena Katz, Hartley Barefoot, MD   dexmedetomidine (PRECEDEX) 400 MCG/100ML (4 mcg/mL) infusion, 0-0.7 mcg/kg/hr, Intravenous, Continuous, Stehler, Oren Bracket, PA-C, Stopped at 01/07/23 0906   insulin aspart (novoLOG) injection 5 Units, 5 Units, Intravenous, Once **AND** dextrose 50 % solution 50 mL, 1 ampule, Intravenous, Once, Bowser, Grace E, NP   dextrose 50 % solution, , , ,    docusate (COLACE) 50 MG/5ML liquid 100 mg, 100 mg, Per Tube, BID, Bowser, Kaylyn Layer, NP   etomidate (AMIDATE) 2 MG/ML injection, , , ,    fentaNYL (SUBLIMAZE) 50 MCG/ML injection, , , ,    fentaNYL (SUBLIMAZE) injection 25 mcg, 25 mcg, Intravenous, Q15 min PRN, Bowser, Kaylyn Layer, NP   fentaNYL (SUBLIMAZE) injection 25-100 mcg, 25-100 mcg, Intravenous, Q30 min PRN, Bowser, Kaylyn Layer, NP   ferric citrate (AURYXIA) tablet 420 mg, 420 mg, Oral, TID WC, Stehler, Bailey C, PA-C   CBG monitoring, , , Q4H **AND** insulin aspart (novoLOG) injection 0-24 Units, 0-24 Units, Subcutaneous, Q4H, Eugenio Hoes, MD   ketamine HCl 50 MG/5ML SOSY, , , ,    lactated ringers infusion, , Intravenous, Continuous, Stehler, Bailey C, PA-C   lactated ringers infusion, , Intravenous, Continuous, Stehler, Oren Bracket, PA-C, Last Rate: 20 mL/hr at 01/07/23 1200, Infusion Verify at 01/07/23 1200   lidocaine (PF) (XYLOCAINE) 1 % injection 5 mL, 5 mL, Intradermal, PRN, Allena Katz, Hartley Barefoot, MD   lidocaine-prilocaine (EMLA) cream 1 Application, 1 Application, Topical, PRN, Dagoberto Ligas, MD   magnesium sulfate IVPB 4 g 100 mL, 4 g, Intravenous, Once, Stehler, Bailey C, PA-C   metoCLOPramide (REGLAN) injection 10 mg, 10 mg, Intravenous, Q6H, Stehler, Bailey C, PA-C, 10 mg at 01/07/23 5638   metoprolol tartrate (LOPRESSOR) tablet 12.5 mg, 12.5 mg, Oral, BID **OR** metoprolol tartrate (LOPRESSOR) 25 mg/10 mL oral  suspension 12.5 mg, 12.5 mg, Per Tube, BID, Stehler, Bailey C, PA-C   metoprolol tartrate (LOPRESSOR) injection 2.5-5 mg, 2.5-5 mg, Intravenous, Q2H PRN, Stehler, Bailey C, PA-C   midazolam (VERSED) 2 MG/2ML injection, , , ,    midazolam (VERSED) injection 2 mg, 2 mg, Intravenous, Q1H PRN, Stehler, Bailey C, PA-C   morphine (PF) 2 MG/ML injection 1-4 mg, 1-4 mg, Intravenous, Q1H PRN, Stehler, Bailey C, PA-C, 1 mg at 01/07/23 0724   naloxone (NARCAN) 0.4 MG/ML injection, , , ,    norepinephrine (LEVOPHED) 16 mg in (0.064 mg/mL) premix infusion, 0-20  mcg/min, Intravenous, Titrated, Eugenio Hoes, MD, Last Rate: 5.63 mL/hr at 02-06-2023 1200, 6 mcg/min at 02-06-2023 1200   ondansetron (ZOFRAN) injection 4 mg, 4 mg, Intravenous, Q6H PRN, Stehler, Oren Bracket, PA-C   Oral care mouth rinse, 15 mL, Mouth Rinse, Q2H, Bowser, Kaylyn Layer, NP   Oral care mouth rinse, 15 mL, Mouth Rinse, PRN, Bowser, Kaylyn Layer, NP   oxyCODONE (Oxy IR/ROXICODONE) immediate release tablet 5-10 mg, 5-10 mg, Oral, Q3H PRN, Stehler, Bailey C, PA-C, 5 mg at 02/06/23 0142   [START ON 01/20/2023] pantoprazole (PROTONIX) EC tablet 40 mg, 40 mg, Oral, Daily, Stehler, Bailey C, PA-C   pantoprazole (PROTONIX) injection 40 mg, 40 mg, Intravenous, QHS, Stehler, Bailey C, PA-C, 40 mg at 01/05/2023 2313   pentafluoroprop-tetrafluoroeth (GEBAUERS) aerosol 1 Application, 1 Application, Topical, PRN, Dagoberto Ligas, MD   polyethylene glycol (MIRALAX / GLYCOLAX) packet 17 g, 17 g, Per Tube, Daily, Bowser, Kaylyn Layer, NP   rocuronium (ZEMURON) 100 MG/10ML injection, , , ,    sertraline (ZOLOFT) tablet 25 mg, 25 mg, Oral, Daily, Stehler, Bailey C, PA-C   sodium bicarbonate injection 50 mEq, 50 mEq, Intravenous, Once, Bowser, Kaylyn Layer, NP   sodium chloride flush (NS) 0.9 % injection 3 mL, 3 mL, Intravenous, Q12H, Stehler, Bailey C, PA-C, 3 mL at Feb 06, 2023 1055   sodium chloride flush (NS) 0.9 % injection 3 mL, 3 mL, Intravenous, PRN, Stehler, Oren Bracket, PA-C    succinylcholine (ANECTINE) 200 MG/10ML syringe, , , ,    traMADol (ULTRAM) tablet 50-100 mg, 50-100 mg, Oral, Q4H PRN, Stehler, Bailey C, PA-C   vasopressin (PITRESSIN) 20 Units in 100 mL (0.2 unit/mL) infusion-*FOR SHOCK*, 0-0.04 Units/min, Intravenous, Continuous, Eugenio Hoes, MD, Last Rate: 6 mL/hr at 2023-02-06 1200, 0.02 Units/min at 06-Feb-2023 1200  Vitals   Vitals:   02/06/23 0930 02-06-23 1000 06-Feb-2023 1100 02-06-23 1200  BP: 113/75 108/70 110/89 94/61  Pulse:      Resp: (!) 21 (!) 21 (!) 24 (!) 31  Temp: 98.8 F (37.1 C) 98.6 F (37 C) 98.8 F (37.1 C) 98.6 F (37 C)  TempSrc:      SpO2:      Weight:      Height:        Body mass index is 23.38 kg/m.  Physical Exam   Constitutional: Appears well-developed and well-nourished.  Psych: Unresponsive Eyes: No scleral injection.  HENT: No OP obstruction.  Head: Normocephalic.  Cardiovascular: Normal rate and regular rhythm.  Respiratory: Mechanically ventilated GI: Soft.  No distension. There is no tenderness.  Skin: WDI.   Neurologic Examination   Mental Status: Eyes open, does not respond to noxious stimuli. Intermittently nods, but not following commands Cranial Nerves: PERRL, (+) corneals, oculocephalics, cough, gag Motor/Sensory: Flaccid No response to noxious stimuli in the extremities  Cerebellar: FNF and HKS are intact bilaterally   Labs/Imaging/Neurodiagnostic studies   CBC:  Recent Labs  Lab February 06, 2023 0844 06-Feb-2023 1001  HGB 7.5* 7.5*  HCT 22.0* 22.0*    Basic Metabolic Panel:  Lab Results  Component Value Date   NA 132 (L) 2023/02/06   K 6.5 (HH) 02-06-2023   CO2 21 (L) 02/06/2023   GLUCOSE 130 (H) 02-06-2023   BUN 42 (H) Feb 06, 2023   CREATININE 5.61 (H) 02-06-2023   CALCIUM 8.0 (L) February 06, 2023   GFRNONAA 7 (L) 02-06-2023   GFRAA 8 (L) 06/15/2019    Lipid Panel: No results found for: "LDLCALC"  HgbA1c:  Lab Results  Component Value Date  HGBA1C 5.2 03/15/2021    Urine Drug  Screen: No results found for: "LABOPIA", "COCAINSCRNUR", "LABBENZ", "AMPHETMU", "THCU", "LABBARB"   Alcohol Level No results found for: "ETH"  INR  Lab Results  Component Value Date   INR 1.6 (H) 01/24/2023    APTT  Lab Results  Component Value Date   APTT 39 (H) 01/26/2023    AED levels: No results found for: "PHENYTOIN", "ZONISAMIDE", "LAMOTRIGINE", "LEVETIRACETA"    CT Head without contrast(Personally reviewed): 1. No evidence of acute intracranial abnormality. ASPECTS of 10. 2. Moderate chronic small vessel ischemic disease.  CT angio Head and Neck with contrast(Personally reviewed): 1. No large vessel occlusion. 2. Intracranial atherosclerosis with mild-to-moderate vertebral artery stenoses. 3. Mild cervical carotid atherosclerosis without significant stenosis. 4. 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 5. No evidence of acute infarct or ischemic penumbra on CTP. 6. Acute right first rib fracture.  Neurodiagnostics rEEG:  Pending   Impression   Melisa Donofrio is a 77 y.o. 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  She initially presented to [facility] with chest pain and was transferred to [facility] for further eval. Emergently taken for a CABGx3 on 11/5 and was extubated on 11/6. Code stroke called due to acute neurological change, decreased responsiveness.  CT head negative for acute hemorrhage. CTA Head and Neck with perfusion completed and negative for LVO or  perfusion deficit. Unable to obtain MRI due to pacing wires in place.   Recommendations  - MRI Brain wo when able - May repeat a 24 hour CT Head wo to look for stroke if unable to obtain MRI - EEG pending - Correct metabolic derangements  ______________________________________________________________________    Leonia Reeves [facility]   Attending Neurohospitalist Addendum Patient seen and examined with APP/Resident. Agree with the history and physical as documented above. Agree with the plan as documented, which I helped formulate. I have edited the note above to reflect my full findings and recommendations. I have independently reviewed the chart, obtained history, review of systems and examined the patient.I have personally reviewed pertinent head/neck/spine imaging (CT/MRI). Please feel free to call with any questions.  Initially given acute mental status change and diffuse hemiplegia there was concern for possible vascular occlusion which was ruled out by CTA. Recommend continued workup of metabolic derangements or cardiorespiratory etiologies of her acute status change. Unable to get MRI 2/2 PM. Will repeat CT head tomorrow and continue to follow.  This patient is critically ill and at significant risk of neurological worsening, death and care requires constant monitoring of vital signs, hemodynamics,respiratory and cardiac monitoring, neurological assessment, discussion with family, other specialists and medical decision making of high complexity. I spent 55 minutes of neurocritical care time  in the care of  this patient. This was time spent independent of any time provided by nurse practitioner or PA.  Bing Neighbors, MD [facility] [phone]  If 7pm- 7am, please page neurology on call as listed in AMION.

## 2023-01-07 NOTE — Progress Notes (Signed)
Arrived to room for STAT EEG pt RN stated she need [place] to come back later. Asked that she let us know when pt is available for EEG. Will attempt back form EEG as schedule permits.

## 2023-01-07 NOTE — Progress Notes (Signed)
  Echocardiogram 2D Echocardiogram has been performed.  Delcie Roch 01/07/2023, 11:25 PM

## 2023-01-07 NOTE — Procedures (Signed)
Extubation Procedure Note  Patient Details:   Name: Bianca Myers DOB: December 04, 1945 MRN: [id number]   Airway Documentation:    Vent end date: 01/07/23 Vent end time: 0910   Evaluation  O2 sats: stable throughout Complications: No apparent complications Patient did tolerate procedure well. Bilateral Breath Sounds: Clear, Diminished   Yes  Patient extubated to 4L [place] per MD order.  Positive cuff leak noted.  Vitals are stable at this time.  Patient did not speak post extubation however currently trying to get out of bed.  Will continue to monitor.   Elyn Peers 01/07/2023, 9:13 AM

## 2023-01-07 NOTE — Procedures (Signed)
Patient Name: Bianca Myers  MRN: [id number]  Epilepsy Attending: Charlsie Quest  Referring Physician/Provider: Lanier Clam, NP  Date: 01/07/2023 Duration: 27.21 mins  Patient history: 77yo F s/p cardiac arrest getting eeg to evaluate for seizure  Level of alertness: comatose  AEDs during EEG study: None  Technical aspects: This EEG study was done with scalp electrodes positioned according to the 10-20 International system of electrode placement. Electrical activity was reviewed with band pass filter of 1-70Hz , sensitivity of 7 uV/mm, display speed of 63mm/sec with a 60Hz  notched filter applied as appropriate. EEG data were recorded continuously and digitally stored.  Video monitoring was available and reviewed as appropriate.  Description: EEG showed continuous generalized 3 to 6 Hz theta-delta slowing. Generalized periodic discharges with triphasic morphology at 1-2 Hz were also noted. Hyperventilation and photic stimulation were not performed.     ABNORMALITY - Periodic discharges with triphasic morphology, generalized ( GPDs) - Continuous slow, generalized  IMPRESSION: This study showed generalized periodic discharges with triphasic morphology. This eeg pattern is typically due to toxic-metabolic etiology. However the frequency of discharges is concerning for ictal-interictal nature. Recommend long term monitoring for further evaluations.  Additionally the study is  suggestive of severe diffuse encephalopathy. No definite seizures were seen throughout the recording.  Bianca Myers [name]

## 2023-01-07 NOTE — Procedures (Signed)
Central Venous Catheter Insertion Procedure Note  Bianca Myers  [id number]  1945-04-06  Date:01/07/23  Time:6:12 PM   Provider Performing:Safa Derner [name]  Cherlynn Polo   Procedure: Insertion of Non-tunneled Central Venous Catheter(36556)with US guidance (40981)    Indication(s) Hemodialysis  Consent Risks of the procedure as well as the alternatives and risks of each were explained to the patient and/or caregiver.  Consent for the procedure was obtained and is signed in the bedside chart  Anesthesia Topical only with 1% lidocaine   Timeout Verified patient identification, verified procedure, site/side was marked, verified correct patient position, special equipment/implants available, medications/allergies/relevant history reviewed, required imaging and test results available.  Sterile Technique Maximal sterile technique including full sterile barrier drape, hand hygiene, sterile gown, sterile gloves, mask, hair covering, sterile ultrasound probe cover (if used).  Procedure Description Area of catheter insertion was cleaned with chlorhexidine and draped in sterile fashion.   With real-time ultrasound guidance a HD catheter was placed into the right femoral vein.  Nonpulsatile blood flow and easy flushing noted in all ports.  The catheter was sutured in place and sterile dressing applied. Prior to femoral placement, attempted right internal jugular HD catheter placement twice with difficulty threading wire and inability to dilate. Therefore, right internal jugular attempts were aborted.   Complications/Tolerance None; patient tolerated the procedure well. Chest X-ray is ordered to verify placement for internal jugular or subclavian cannulation.  Chest x-ray is not ordered for femoral cannulation.  EBL Minimal  Specimen(s) None

## 2023-01-07 NOTE — Progress Notes (Signed)
T[facility] DAILY ICU PROGRESS NOTE                   [address]            [place] [id number]          [phone]   1 Day Post-Op Procedure(s) (LRB): CORONARY ARTERY BYPASS GRAFTING (CABG) x3 USING LEFT INTERNAL MAMMARY ARTERY AND HARVESTED RIGHT GREATER SAPHENOUS VEIN ENDOSCOPICALLY (N/A) TRANSESOPHAGEAL ECHOCARDIOGRAM (N/A)  Total Length of Stay:  LOS: 1 day   Subjective: On ventilator.  Awake, nods to voice but will not follow any commands.  Vasopressin at 0.04 units/min Norepinephrine weaned from 10 mcg/min to 5 mcg/min  Objective: Vital signs in last 24 hours: Temp:  [95.5 F (35.3 C)-98.4 F (36.9 C)] 98.4 F (36.9 C) (11/06 0715) Pulse Rate:  [66-90] 90 (11/06 0715) Resp:  [12-28] 25 (11/06 0715) BP: (72-187)/(50-98) 99/73 (11/06 0630) SpO2:  [89 %-100 %] 100 % (11/06 0715) Arterial Line BP: (72-144)/(44-77) 124/70 (11/06 0715) FiO2 (%):  [50 %] 50 % (11/06 0400) Weight:  [63.5 kg-67.7 kg] 67.7 kg (11/06 0414)  Filed Weights   01/25/2023 1641 01/07/23 0414  Weight: 63.5 kg 67.7 kg    Weight change:    Hemodynamic parameters for last 24 hours: PAP: (30-56)/(12-27) 30/14 CO:  [2.1 L/min-3.9 L/min] 3.8 L/min CI:  [1.21 L/min/m2-2.23 L/min/m2] 2.18 L/min/m2  Intake/Output from previous day: 11/05 0701 - 11/06 0700 In: 4658.3 [I.V.:4233.3; Blood:225; IV Piggyback:200] Out: 710 [Blood:400; Chest Tube:310]  Intake/Output this shift: No intake/output data recorded.  Current Meds: Scheduled Meds:  acetaminophen  1,000 mg Oral Q6H   Or   acetaminophen (TYLENOL) oral liquid 160 mg/5 mL  1,000 mg Per Tube Q6H   amiodarone  400 mg Oral BID   aspirin EC  325 mg Oral Daily   Or   aspirin  324 mg Per Tube Daily   atorvastatin  10 mg Oral QPM   bisacodyl  10 mg Oral Daily   Or   bisacodyl  10 mg Rectal Daily   Chlorhexidine Gluconate Cloth  6 each Topical Daily   docusate sodium  200 mg Oral Daily   ferric citrate  420 mg Oral TID WC    metoCLOPramide (REGLAN) injection  10 mg Intravenous Q6H   metoprolol tartrate  12.5 mg Oral BID   Or   metoprolol tartrate  12.5 mg Per Tube BID   mouth rinse  15 mL Mouth Rinse Q2H   [START ON 01/04/2023] pantoprazole  40 mg Oral Daily   pantoprazole (PROTONIX) IV  40 mg Intravenous QHS   sertraline  25 mg Oral Daily   sodium chloride flush  3 mL Intravenous Q12H   Continuous Infusions:  sodium chloride     sodium chloride     sodium chloride 10 mL/hr at 01/07/23 0600   albumin human      ceFAZolin (ANCEF) IV     dexmedetomidine (PRECEDEX) IV infusion Stopped (01/07/23 0354)   insulin 0.8 Units/hr (01/07/23 0600)   lactated ringers     lactated ringers 20 mL/hr at 01/07/23 0600   magnesium sulfate     nitroGLYCERIN     norepinephrine (LEVOPHED) Adult infusion 5 mcg/min (01/07/23 0600)   phenylephrine (NEO-SYNEPHRINE) Adult infusion     vancomycin     vasopressin 0.04 Units/min (01/07/23 0600)   PRN Meds:.sodium chloride, albumin human, albuterol, dextrose, lidocaine (PF), lidocaine-prilocaine, metoprolol tartrate, midazolam, morphine injection, ondansetron (ZOFRAN) IV, mouth rinse, oxyCODONE, pentafluoroprop-tetrafluoroeth, sodium  chloride flush, traMADol  General appearance: Anxious, uncooperative Neurologic: Moves all extremities spontaneously.  Nods to voice but will not follow any commands Heart: Regular rate and rhythm.  CI 2.0 - 2.4, PAP 29/14 Lungs: Breath sounds are coarse.  Chest x-ray showed ET tube and PA catheter in proper position.  She has bilateral apical scarring is stable.  Lung fields expanded, expected postop changes.   Chest tube drainage 10 to 20 mL/h for past several hours. Abdomen: Soft, no apparent tenderness Extremities: Warm and well-perfused, has mittens applied to both hands Wound: Sternal incision is covered with a dry Aquacel dressing.  Lab Results: CBC: Recent Labs    01/07/23 0346 01/07/23 0637  HGB 8.2* 7.8*  HCT 24.0* 23.0*   BMET:   Recent Labs    01/15/2023 2116 01/18/2023 2242 01/07/23 0443 01/07/23 0637  NA 133*   < > 134* 132*  K 6.2*   < > 7.2* 6.5*  CL 98  --  100  --   CO2  --   --  21*  --   GLUCOSE 144*  --  130*  --   BUN 35*  --  42*  --   CREATININE 5.10*  --  5.61*  --   CALCIUM  --   --  8.0*  --    < > = values in this interval not displayed.    CMET: Lab Results  Component Value Date   WBC 9.0 01/18/2022   HGB 7.8 (L) 01/07/2023   HCT 23.0 (L) 01/07/2023   PLT 272 01/18/2022   GLUCOSE 130 (H) 01/07/2023   ALT 13 03/15/2021   AST 19 03/15/2021   NA 132 (L) 01/07/2023   K 6.5 (HH) 01/07/2023   CL 100 01/07/2023   CREATININE 5.61 (H) 01/07/2023   BUN 42 (H) 01/07/2023   CO2 21 (L) 01/07/2023   TSH 1.113 02/28/2019   INR 1.6 (H) 01/28/2023   HGBA1C 5.2 03/15/2021      PT/INR:  Recent Labs    01/29/2023 2227  LABPROT 19.5*  INR 1.6*   Radiology: DG Chest Port 1 View  Result Date: 01/02/2023 CLINICAL DATA:  Coronary artery bypass grafting EXAM: PORTABLE CHEST 1 VIEW COMPARISON:  05/04/2022 FINDINGS: Endotracheal tube is seen 2.8 cm above the carina. Nasogastric tube extends into the upper abdomen beyond the margin examination. Left internal jugular Swan-Ganz catheter tip is seen within the expected pulmonary arterial bifurcation. Biapical pulmonary scarring, more prevalent within the right upper lobe with associated volume loss, is stable since prior CT examination. Mediastinal drain and left chest tube are in place. No pneumothorax or pleural effusion. Coronary artery bypass grafting has been performed. Cardiac size is within normal limits. Pulmonary vascularity is normal. No acute bone abnormality. IMPRESSION: 1. Support tubes in appropriate position. Left chest tube in place. No pneumothorax. 2. Stable biapical pulmonary scarring, more prevalent within the right upper lobe with associated volume loss. Electronically Signed   By: Helyn Numbers M.D.   On: 01/30/2023 23:26   ECHO  INTRAOPERATIVE TEE  Result Date: 01/31/2023  *INTRAOPERATIVE TRANSESOPHAGEAL REPORT *  Patient Name:   Bianca Myers Date of Exam: 01/07/2023 Medical Rec #:  [id number]         Height:       67.0 in Accession #:    [id number]        Weight:       140.0 lb Date of Birth:  Dec 17, 1945        BSA:  1.74 m Patient Age:    77 years          BP:           138/53 mmHg Patient Gender: F                 HR:           69 bpm. Exam Location:  Anesthesiology Transesophogeal exam was perform intraoperatively during surgical procedure. Patient was closely monitored under general anesthesia during the entirety of examination. Indications:     R07.9* Chest pain, unspecified Performing Phys: [id number] Eugenio Hoes Complications: No known complications during this procedure. POST-OP IMPRESSIONS _ Left Ventricle: has normal systolic function, with an ejection fraction of 60%. The cavity size was normal. The wall motion is normal. _ Right Ventricle: The right ventricle appears unchanged from pre-bypass normal function. _ Aorta: The aorta appears unchanged from pre-bypass. _ Left Atrial Appendage: The left atrial appendage appears unchanged from pre-bypass. _ Aortic Valve: The aortic valve appears unchanged from pre-bypass. There is mild regurgitation. _ Mitral Valve: There is mild regurgitation. _ Tricuspid Valve: There is mild regurgitation. _ Interatrial Septum: The interatrial septum appears unchanged from pre-bypass. _ Pericardium: The pericardium appears unchanged from pre-bypass. _ Comments: Post-bypass images reviewed with surgeon. PRE-OP FINDINGS  Left Ventricle: The left ventricle has normal systolic function, with an ejection fraction of 55-60%. The cavity size was normal. There is mild concentric left ventricular hypertrophy. There is mild anterior wall hypokinesis. Right Ventricle: The right ventricle has normal systolic function. The cavity was normal. There is no increase in right ventricular wall thickness.  Left Atrium: Left atrial size was dilated. No left atrial/left atrial appendage thrombus was detected. Right Atrium: Right atrial size was normal in size. Right atrial pressure is estimated at 15 mmHg. Interatrial Septum: No atrial level shunt detected by color flow Doppler. Pericardium: There is no evidence of pericardial effusion. Mitral Valve: The mitral valve is normal in structure. Mitral valve regurgitation mild-moderate. The MR jet is centrally-directed. Tricuspid Valve: The tricuspid valve was normal in structure. Tricuspid valve regurgitation is mild by color flow Doppler. Aortic Valve: The aortic valve is tricuspid Aortic valve regurgitation is mild by color flow Doppler. There is mild stenosis of the aortic valve. Mildly restricted left coronary cusp. Pulmonic Valve: The pulmonic valve was normal in structure. Pulmonic valve regurgitation is trivial by color flow Doppler. Aorta: The aortic root and ascending aorta are normal in size and structure. There is evidence of plaque in the descending aorta. +---------------+------+-------+ RIGHT VENTRICLE              +---------------+------+-------+ TAPSE (M-mode):3.3 cm2.37 cm +---------------+------+-------+ +------------------+------------++ AORTIC VALVE                   +------------------+------------++ AV Vmax:          252.00 cm/s  +------------------+------------++ AV Vmean:         159.000 cm/s +------------------+------------++ AV VTI:           0.602 m      +------------------+------------++ AV Peak Grad:     25.4 mmHg    +------------------+------------++ AV Mean Grad:     12.5 mmHg    +------------------+------------++ LVOT Vmax:        84.60 cm/s   +------------------+------------++ LVOT Vmean:       59.100 cm/s  +------------------+------------++ LVOT VTI:         0.182 m      +------------------+------------++ LVOT/AV VTI ratio:0.30         +------------------+------------++  +---------------+-----------++  TRICUSPID VALVE            +---------------+-----------++ TR Peak grad:  54.2 mmHg   +---------------+-----------++ TR Vmax:       368.00 cm/s +---------------+-----------++  +-------------+------+ SHUNTS              +-------------+------+ Systemic VTI:0.18 m +-------------+------+  Arrie Aran MD Electronically signed by Arrie Aran MD Signature Date/Time: 01/24/2023/10:29:00 PM    Final    EP STUDY  Result Date: 01/22/2023 See surgical note for result.  CARDIAC CATHETERIZATION  Result Date: 01/09/2023   Mid LM to Ost LAD lesion is 95% stenosed with 65% stenosed side branch in Ost Cx to Prox Cx.   1st Mrg lesion is 80% stenosed.   Ramus lesion is 70% stenosed.   Prox LAD lesion is 50% stenosed with 50% stenosed side branch in 1st Diag.   Prox RCA to Mid RCA lesion is 70% stenosed.   RPDA lesion is 80% stenosed.   There is mild aortic valve stenosis. 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 Proximal LAD-D1 bifurcation roughly 50% stenosis Mid OM1 80% eccentric Mid RCA at RV marginal branch focal 70%; mid PDA eccentric 80% LVEDP 24 - 29 mmHg with no clear Aortic Valve Gradient RECOMMENDATIONS   Patient will be taken directly to the Cath Lab to the OR for emergent CABG   Recommend Aspirin 81mg  daily for moderate CAD.   Would strongly consider Plavix post CABG prior to discharge Bryan Lemma, MD    Assessment/Plan: S/P Procedure(s) (LRB): CORONARY ARTERY BYPASS GRAFTING (CABG) x3 USING LEFT INTERNAL MAMMARY ARTERY AND HARVESTED RIGHT GREATER SAPHENOUS VEIN ENDOSCOPICALLY (N/A) TRANSESOPHAGEAL ECHOCARDIOGRAM (N/A)  -Postop day 1 emergency CABG x 3 for critical left main stenosis with preserved biventricular function.  Stable hemodynamics.  Weaning vasopressor support.  Leave chest tubes in place for now.  -End-stage renal disease-on hemodialysis 3 times  weekly prior to admission.  Potassium was 8.0 at 3 AM and currently 6.5 without any intervention.  Consulting nephrology for assistance with management.  -Pulm-supported with ventilator, oxygenating well.  Mental status is not appropriate for extubation at this point.  Continue support.  -Heme-expected acute blood loss anemia.  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  And iron replacement when able to take p.o.'s  -Endo-no history of diabetes.  Glucose well-controlled with Endo tool: Insulin drip.  Transition to sliding scale insulin.  -History of depression-resume her Zoloft when able to take p.o.'s     Leary Roca, PA-C 01/07/2023 8:06 AM

## 2023-01-07 NOTE — Progress Notes (Signed)
Patient Name: Bianca Myers Date of Encounter: 01/07/2023 [facility] Cardiologist: Norman Herrlich, MD   Interval Summary  .    Intubated, sedated Awake, but not following commands.  On vasopressin 0.04 units/min Norepinephrine 5 mcg/min  Vital Signs .    Vitals:   01/07/23 0715 01/07/23 0754 01/07/23 0800 01/07/23 0816  BP:  (!) 104/51 106/80   Pulse: 90 90    Resp: (!) 25 (!) 26 17   Temp: 98.4 F (36.9 C)  98.6 F (37 C)   TempSrc:      SpO2: 100% 100%  100%  Weight:      Height:        Intake/Output Summary (Last 24 hours) at 01/07/2023 0906 Last data filed at 01/07/2023 0800 Gross per 24 hour  Intake 4750.3 ml  Output 730 ml  Net 4020.3 ml      01/07/2023    4:14 AM 01/19/2023    4:41 PM 10/28/2022    9:38 AM  Last 3 Weights  Weight (lbs) 149 lb 4 oz 139 lb 15.9 oz 140 lb  Weight (kg) 67.7 kg 63.5 kg 63.504 kg      Telemetry/ECG    Telemetry 01/07/2023 - Personally Reviewed: Sinus or junctional rhythm No arrhythmias  Op note 01/09/2023 (Dr. Leafy Ro): CABGX3 (LIMA-LAD, rSVG-PDA, rSVG-OM)  Coronary angiogram 01/10/2023 (Dr. Herbie Baltimore): Personally reviewed and independently interpreted by me  Severe multivessel disease: Distal left main 90% stenosis involving ostial LCx 70% and small RI 70% stenoses. Proximal LAD-D1 bifurcation roughly 50% stenosis Mid OM1 80% eccentric Mid RCA at RV marginal branch focal 70%; mid PDA eccentric 80% LVEDP 24 - 29 mmHg with no clear Aortic Valve Gradient  External echocardiogram report 08/2022: EF 55 to 60%, mild LVH, impaired on a section Severe LAE, mild RA dilatation Mild MR, trace AI Moderate AS, DVI 0.36 Moderate TR, RVSP 67 mmHg    Physical Exam .   Physical Exam Vitals and nursing note reviewed.  Constitutional:      General: She is not in acute distress. Neck:     Vascular: No JVD.  Cardiovascular:     Rate and Rhythm: Normal rate and regular rhythm.     Heart sounds: Normal heart sounds.  No murmur heard.    Comments: Sternal dressing in place Pulmonary:     Effort: Pulmonary effort is normal.     Breath sounds: Normal breath sounds. No wheezing or rales.     Comments: ETT in place Musculoskeletal:     Right lower leg: No edema.     Left lower leg: No edema.      Assessment & Plan .     77 y/o female, Jehovah's Witness, with ESRD on HD, CAD with progressive angina. Critical LM and multivessel CAD (cath 11/5), now s/p CABGX3 (LIMA-LAD, rSVG-PDA, rSVG-OM) 11/5 by Dr. Leafy Ro  CAD: Presented with unstable angina. POD#1  CABGX3 (LIMA-LAD, rSVG-PDA, rSVG-OM Remains extubated on low-dose pressors. Hoping to extubate later today, if mentation improves.  ESRD: K6.5 this morning.  Nephrology consultation pending.  Anemia: Stable, Jehovah's Witness, on iron supplement  H/o Pulmonary hypertension: RVSP noted 67 mmHg on external echocardiogram in 08/2022. No pulmonary hypertension currently evident on PA catheter. Will obtain repeat echocardiogram this admission.  CRITICAL CARE Performed by: Truett Mainland   Total critical care time: 35 minutes   Critical care time was exclusive of separately billable procedures and treating other patients.   Critical care was necessary to treat or prevent imminent or  life-threatening deterioration.   Critical care was time spent personally by me on the following activities: development of treatment plan with patient and/or surrogate as well as nursing, discussions with consultants, evaluation of patient's response to treatment, examination of patient, obtaining history from patient or surrogate, ordering and performing treatments and interventions, ordering and review of laboratory studies, ordering and review of radiographic studies, pulse oximetry and re-evaluation of patient's condition.     For questions or updates, please contact [facility] Please consult [email or web address] for contact info under         Signed, Elder Negus, MD

## 2023-01-07 NOTE — Plan of Care (Signed)
  Problem: Coping: Goal: Level of anxiety will decrease Outcome: Progressing   Problem: Pain Management: Goal: General experience of comfort will improve Outcome: Progressing   Problem: Safety: Goal: Ability to remain free from injury will improve Outcome: Progressing   Problem: Cardiac: Goal: Will achieve and/or maintain hemodynamic stability Outcome: Progressing   Problem: Clinical Measurements: Goal: Postoperative complications will be avoided or minimized Outcome: Progressing   Problem: Respiratory: Goal: Respiratory status will improve Outcome: Progressing

## 2023-01-07 NOTE — Procedures (Signed)
Arterial Line Insertion Start/End11/08/2022 2:26 PM  Patient location: ICU. Preanesthetic checklist: patient identified, IV checked, site marked, risks and benefits discussed, surgical consent, monitors and equipment checked, pre-op evaluation and timeout performed Patient sedated Right, radial was placed Catheter size: 20 G Hand hygiene performed  and maximum sterile barriers used  Allen's test indicative of satisfactory collateral circulation Attempts: 1 Procedure performed without using ultrasound guided technique. Following insertion, dressing applied and Biopatch. Post procedure assessment: unchanged  Patient tolerated the procedure well with no immediate complications.

## 2023-01-07 NOTE — Progress Notes (Signed)
Came to 2H to assess a femoral sheath that was being used as a primary A-line. Staff unable to aspirate and no waveform, sheath noted to be kinked near hub and partially pulled back. Once sheath adjusted, sheath able to be aspirated.Clot present on initial aspiration, clot aspirated and  additional blood wasted with no clot present. Sheath able to be flushed with good waveform present. Dr. Rosemary Holms aware and advised sheath can be kept until A-line placed.

## 2023-01-07 NOTE — Progress Notes (Signed)
RT assessed pt for possible weaning to extubate. Pt only able to open eyes briefly and shake head to answer yes and no questions. Pt is raising up in the bed and aggressively trying to remove mits, but follows no commands. RT and RN decided to hold off on weaning at this time.

## 2023-01-07 NOTE — Progress Notes (Signed)
Pt hypotensive 70s/40s maxed on levo @20 . HR alternating from 90s-120s. Patient on CRRT with 0 Fluid removal. Hendrickson notified by dayshift RN and received verbal orders to increase levo ceiling to 30 and bolus amio if HR gets too high. Weldner notified and received orders to give 500 NS bolus, get a stat ECHO, and notify CCM. 500 NS bolus given.   Elink notified and on camera in room. Received orders to give 1 albumin 5% bottle and increase pressors with patient in trendelenburg position. Received orders for an ABG check and to give patient IV insulin/D50/2g calcium gluconate/1 amp sodium bicarbonate.

## 2023-01-07 NOTE — Progress Notes (Signed)
Reached out to RN pt needs [employer] placed. Will try back for EEG as schedule permits. This EEG may be done on 2nd shift.

## 2023-01-07 NOTE — Progress Notes (Signed)
Patient ID: Bianca Myers, female   DOB: [birth year], 77 y.o.   MRN: [id number]  Interval update:  Patient decompensated neurologically and reintubated. CVA work up negative. HD nurse could not start dialysis because of prohibitive hypotension/hemodynamic state. NS bolus given. Discussed with Dr. Tonia Brooms, would be prudent to start CRRT at this time for volume/electrolyte management.  CCM will assist with HD cathter placement and I will order for CRRT. Keep even from fluid standpoint for 2-3hr and then start net UF at 50-159mL/hr.  Zetta Bills MD [facility]. Office # [phone] Pager # [phone] 3:54 PM

## 2023-01-07 NOTE — Procedures (Signed)
Intubation Procedure Note  Bianca Myers  [id number]  08/14/1945  Date:01/07/23  Time:12:55 PM   Provider Performing:Peighton Mehra L Ellowyn Rieves    Procedure: Intubation (31500)  Indication(s) Respiratory Failure  Consent Unable to obtain consent due to emergent nature of procedure.   Anesthesia Etomidate and Rocuronium   Time Out Verified patient identification, verified procedure, site/side was marked, verified correct patient position, special equipment/implants available, medications/allergies/relevant history reviewed, required imaging and test results available.   Sterile Technique Usual hand hygeine, masks, and gloves were used   Procedure Description Patient positioned in bed supine.  Sedation given as noted above.  Patient was intubated with endotracheal tube using Glidescope.  View was Grade 1 full glottis .  Number of attempts was 1.  Colorimetric CO2 detector was consistent with tracheal placement.   Complications/Tolerance None; patient tolerated the procedure well. Chest X-ray is ordered to verify placement.   EBL Minimal   Specimen(s) None  Josephine Igo, DO [facility] 01/07/2023 12:56 PM

## 2023-01-07 NOTE — Progress Notes (Signed)
STAT EEG complete - results pending. ? ?

## 2023-01-07 NOTE — Progress Notes (Addendum)
At bedside to assess for acute mental status change following an episode of Afib RVR for which she received amio bolus  This morning was awake, moving spontaneous. 5/5 against gravity and would intermittently answer yes/no.   Now, exam is Pupils 2mm, slightly disconjugate upward gaze. Protecting airway, + corneals.  She has no response to pain  She is more cold and clammy than this morning as well   LKN is difficult to determine -- possibly last night pre-op (was agitated post op, has hx of this after prior big surgeries per son). Sounds like this acute change occurred approx 1200    CBG is WNL Has been hours since last opiate  ABG has resp acidosis and hypercarbia pCO2 60s  ESRD w elevated BUN, hyperkalemia   -do not think that BUN and hypercarbia would explain this  Acute encephalopathy Concern for acute stroke -hx Afib, not on  AC. Acute AMS after she went into Afib RBR req amio.  P -calling neuro for code stroke  -will give some narcan in interim (tx an easily reversible cause) but doubt this will have an impact -will re-intubate before she goes down to code stroke scans  -giving insulin/dextrose, bicarb, albuterol neb and lokelma for K 8 -there are plans for dialysis  -will send a coox, repeat bmp, ammonia   -If all imaging is unremarkable, eeg  -if incr pressor req may need CRRT over iHD -- talked with son about this who verbally consents for temp HD cath placement   D/w my attending, as well as neuro. Primary has also been updated.  Family updated at bedside throughout.   CCT 27 min    Bianca Fass MSN, AGACNP-BC [facility] Pulmonary/Critical Care Medicine 01/07/2023, 1:17 PM

## 2023-01-07 NOTE — Progress Notes (Signed)
SLP Cancellation Note  Patient Details Name: Bianca Myers MRN: [id number] DOB: Aug 26, 1945   Cancelled treatment:       Reason Eval/Treat Not Completed: Medical issues which prohibited therapy. Pt with neuro change and intubated. Imaging pending. SLP will continue following.    Gwynneth Aliment, M.A., CF-SLP Speech Language Pathology, Acute Rehabilitation Services  Secure Chat preferred [phone]  01/07/2023, 3:30 PM

## 2023-01-07 NOTE — Consult Note (Signed)
NAME:  Bianca Myers, MRN:  [id number], DOB:  1945-11-23, LOS: 1 ADMISSION DATE:  01/23/2023, CONSULTATION DATE:  01/07/23 REFERRING MD:  Leafy Ro, CHIEF COMPLAINT:  s/p CABG    History of Present Illness:  77 yo F PMH ESRD on Hd, DM, Anemia, Jehovah's witness, s/p prior colostomy who presented to OSH ED 11/3 with CC chest pain-- pressure over the last few months but much worse the last week or so. At OSH she was found to have NSTEMI and developed hypoxic resp failure req BiPAP, and was found to have sev PAH based on OSH ECHO. She was transferred to [facility] 11/5 for further management and care. Went for cath w interventional cards and was found to have critical L main stenosis for which CVTS was consulted (not a suitable IABP candidate w calcified aorto-iliac dz)and took pt to OR for emergent CABG x  3.   She was admitted to ICU post op, intubated as per pre op plan.  PCCM is consulted 11/6 in this setting   Pertinent  Medical History  ESRD CAD DM PAH NSTEMI Jehovah witness Chronic anemia   Significant Hospital Events: Including procedures, antibiotic start and stop dates in addition to other pertinent events   11/3 to [facility] w chest pain. During admission found to have NSTEMI, HFpEF, sev PAH 11/5 transferred to [facility] for cath -- critical LM stenosis. Went for emergent CABG  11/6 PCCM consult. Extubated. Nephro consult   Interim History / Subjective:  Emergency CABG overnight   Objective   Blood pressure 113/75, pulse 90, temperature 98.8 F (37.1 C), resp. rate (!) 21, height 5\' 7"  (1.702 m), weight 67.7 kg, SpO2 100%. PAP: (28-56)/(12-27) 34/13 CO:  [2.1 L/min-4.2 L/min] 3.6 L/min CI:  [1.21 L/min/m2-2.4 L/min/m2] 2.05 L/min/m2  Vent Mode: PSV;CPAP FiO2 (%):  [40 %-50 %] 40 % Set Rate:  [4 bmp-16 bmp] 4 bmp Vt Set:  [490 mL] 490 mL PEEP:  [5 cmH20] 5 cmH20 Pressure Support:  [10 cmH20] 10 cmH20   Intake/Output Summary (Last 24 hours) at 01/07/2023 0943 Last data filed at  01/07/2023 0900 Gross per 24 hour  Intake 4797.3 ml  Output 780 ml  Net 4017.3 ml   Filed Weights   01/29/2023 1641 01/07/23 0414  Weight: 63.5 kg 67.7 kg    Examination: General: chronically and acutely ill F intubated lightly sedated  HENT: ETT secure anicteric sclera. Dried blood at corners of mouth  Lungs: Mechanically ventilated  Cardiovascular: s1s2 cap refill < 3 sec  Abdomen: soft round. + colostomy.  Extremities: LUE fistula  Neuro: Does not follow commands. Moving BUE BLE spontaneously with 5/5 strength. Pupils are 3mm PERRLA GU: foley   Resolved Hospital Problem list     Assessment & Plan:   mvCAD and critical LM stenosis s/p emergent CABG x3  Endotracheally intubated Acute encephalopathy  HFpEF Severe PAH  Hypotension Hx HTN  ESRD  Hyperkalemia  Secondary hyperparathyroidism  DM2 ABLA superimposed on Chronic anemia Jehovah's witness; declines blood product  P -lines/drains/wires per CVTS  -complete ppx abx  -has hx of difficulty waking after anesthesia per son -- encephalopathy will likely not serve as barrier to extubation in this case -check an ABG -- if ok, move to extubate -wean pressors as able -nephro consult, will do iHD in ICU on low dose pressors -transition off of insulin gtt  -lab sparing as able to limit iatrogenic losses -statin, amio -when appropriate, BB  -delirium precautions, wean sedation. For pt safety, do need soft  wrist restraints for now    Best Practice (right click and "Reselect all SmartList Selections" daily)   Diet/type: NPO DVT prophylaxis: SCD GI prophylaxis: PPI Lines: Central line Foley:  Yes, and it is still needed Code Status:  full code Last date of multidisciplinary goals of care discussion [updated son at bedside 11/6]  Labs   CBC: Recent Labs  Lab 01/31/2023 2247 01/07/23 0006 01/07/23 0346 01/07/23 0637 01/07/23 0844  HGB 7.8* 8.2* 8.2* 7.8* 7.5*  HCT 23.0* 24.0* 24.0* 23.0* 22.0*    Basic  Metabolic Panel: Recent Labs  Lab 01/29/2023 1902 01/07/2023 1946 01/21/2023 1950 01/16/2023 2026 01/15/2023 2111 01/26/2023 2116 01/24/2023 2242 01/07/23 0006 01/07/23 0346 01/07/23 0443 01/07/23 0637 01/07/23 0844  NA 133*   < > 129* 130*   < > 133*   < > 131* 129* 134* 132* 133*  K 4.4   < > 5.0 7.1*   < > 6.2*   < > 6.8* 8.0* 7.2* 6.5* 6.2*  CL 99  --  96* 99  --  98  --   --   --  100  --   --   CO2  --   --   --   --   --   --   --   --   --  21*  --   --   GLUCOSE 102*  --  142* 118*  --  144*  --   --   --  130*  --   --   BUN 34*  --  33* 36*  --  35*  --   --   --  42*  --   --   CREATININE 5.40*  --  4.80* 5.20*  --  5.10*  --   --   --  5.61*  --   --   CALCIUM  --   --   --   --   --   --   --   --   --  8.0*  --   --   MG  --   --   --   --   --   --   --   --   --  2.4  --   --    < > = values in this interval not displayed.   GFR: Estimated Creatinine Clearance: 8.2 mL/min (A) (by C-G formula based on SCr of 5.61 mg/dL (H)). No results for input(s): "PROCALCITON", "WBC", "LATICACIDVEN" in the last 168 hours.  Liver Function Tests: No results for input(s): "AST", "ALT", "ALKPHOS", "BILITOT", "PROT", "ALBUMIN" in the last 168 hours. No results for input(s): "LIPASE", "AMYLASE" in the last 168 hours. No results for input(s): "AMMONIA" in the last 168 hours.  ABG    Component Value Date/Time   PHART 7.324 (L) 01/07/2023 0844   PCO2ART 40.5 01/07/2023 0844   PO2ART 163 (H) 01/07/2023 0844   HCO3 21.1 01/07/2023 0844   TCO2 22 01/07/2023 0844   ACIDBASEDEF 5.0 (H) 01/07/2023 0844   O2SAT 99 01/07/2023 0844     Coagulation Profile: Recent Labs  Lab 01/28/2023 2227  INR 1.6*    Cardiac Enzymes: No results for input(s): "CKTOTAL", "CKMB", "CKMBINDEX", "TROPONINI" in the last 168 hours.  HbA1C: Hgb A1c MFr Bld  Date/Time Value Ref Range Status  03/15/2021 05:00 AM 5.2 4.8 - 5.6 % Final    Comment:    (NOTE) Pre diabetes:          5.7%-6.4%  Diabetes:               >6.4%  Glycemic control for   <7.0% adults with diabetes   03/09/2019 08:48 AM 6.1 (H) 4.8 - 5.6 % Final    Comment:    (NOTE) Pre diabetes:          5.7%-6.4% Diabetes:              >6.4% Glycemic control for   <7.0% adults with diabetes     CBG: Recent Labs  Lab 01/07/23 0447 01/07/23 0554 01/07/23 0635 01/07/23 0844 01/07/23 0929  GLUCAP 122* 126* 136* 92 101*    Review of Systems:   Unable to obtain  Past Medical History:  She,  has a past medical history of Anemia, Arthritis, Bowel perforation (HCC) (2024), Colostomy in place [facility]) (2024), Depression, Diabetes mellitus (HCC), Dyslipidemia (03/15/2021), Dyspnea, ESRD (end stage renal disease) on dialysis (HCC), Gout, History of anemia due to chronic kidney disease, History of degenerative disc disease, Hypertension, Pneumonia, Refusal of blood transfusions as patient is Jehovah's Witness, Secondary hyperparathyroidism (HCC), and Spondylisthesis.   Surgical History:   Past Surgical History:  Procedure Laterality Date   A/V FISTULAGRAM Left 12/16/2018   Procedure: A/V FISTULAGRAM;  Surgeon: Cephus Shelling, MD;  Location: [facility] INVASIVE CV LAB;  Service: Cardiovascular;  Laterality: Left;   A/V FISTULAGRAM Left 06/13/2019   Procedure: A/V FISTULAGRAM;  Surgeon: Cephus Shelling, MD;  Location: [facility] INVASIVE CV LAB;  Service: Cardiovascular;  Laterality: Left;   AV FISTULA PLACEMENT Left 08/24/2018   Procedure: ARTERIOVENOUS (AV) FISTULA CREATION;  Surgeon: Maeola Harman, MD;  Location: [facility] OR;  Service: Vascular;  Laterality: Left;   AV FISTULA PLACEMENT Left 07/12/2019   Procedure: INSERTION OF LEFT UPPER ARM  ARTERIOVENOUS (AV) GORE-TEX GRAFT;  Surgeon: Sherren Kerns, MD;  Location: [facility] OR;  Service: Vascular;  Laterality: Left;   BASCILIC VEIN TRANSPOSITION Left 11/04/2018   Procedure: BASCILIC VEIN TRANSPOSITION SECOND STAGE;  Surgeon: Maeola Harman, MD;  Location: [facility] OR;  Service:  Vascular;  Laterality: Left;   COLON SURGERY     COLONOSCOPY     FRACTURE SURGERY Left    wrist   HIP SURGERY     INSERTION OF DIALYSIS CATHETER N/A 06/15/2019   Procedure: INSERTION OF TUNNELED  DIALYSIS CATHETER RIGHT INTERNAL JUGULAR;  Surgeon: Nada Libman, MD;  Location: [facility] OR;  Service: Vascular;  Laterality: N/A;   IR FLUORO GUIDE CV LINE RIGHT  08/21/2018   IR FLUORO GUIDE CV LINE RIGHT  06/08/2019   IR PERC TUN PERIT CATH WO PORT S&I /IMAG  12/19/2019   IR REMOVAL TUN CV CATH W/O FL  03/11/2019   IR THROMBECTOMY AV FISTULA W/THROMBOLYSIS/PTA INC/SHUNT/IMG LEFT Left 12/21/2019   IR US GUIDE VASC ACCESS LEFT  12/21/2019   IR US GUIDE VASC ACCESS RIGHT  08/21/2018   IR US GUIDE VASC ACCESS RIGHT  06/08/2019   IR US GUIDE VASC ACCESS RIGHT  12/19/2019   LEFT HEART CATH AND CORONARY ANGIOGRAPHY N/A 01/29/2023   Procedure: LEFT HEART CATH AND CORONARY ANGIOGRAPHY;  Surgeon: Marykay Lex, MD;  Location: [facility] INVASIVE CV LAB;  Service: Cardiovascular;  Laterality: N/A;   PERIPHERAL VASCULAR BALLOON ANGIOPLASTY Left 06/13/2019   Procedure: PERIPHERAL VASCULAR BALLOON ANGIOPLASTY;  Surgeon: Cephus Shelling, MD;  Location: [facility] INVASIVE CV LAB;  Service: Cardiovascular;  Laterality: Left;  AVF   PERIPHERAL VASCULAR INTERVENTION Left 12/16/2018   Procedure: PERIPHERAL VASCULAR INTERVENTION;  Surgeon: Cephus Shelling, MD;  Location[facility] INVASIVE CV LAB;  Service: Cardiovascular;  Laterality: Left;  arm fistula   REVISON OF ARTERIOVENOUS FISTULA Left 10/28/2022   Procedure: REPAIR ULCER OVER LEFT ARM ARTERIOVENOUS FISTULA;  Surgeon: Chuck Hint, MD;  Location: [facility] OR;  Service: Vascular;  Laterality: Left;   SHOULDER SURGERY Right 2021   due to a bone infection   TOTAL HIP ARTHROPLASTY Left 06/10/2019   Procedure: TOTAL HIP ARTHROPLASTY ANTERIOR APPROACH;  Surgeon: Tarry Kos, MD;  Location: [facility] OR;  Service: Orthopedics;  Laterality: Left;     Social History:    reports that she quit smoking about 51 years ago. Her smoking use included cigarettes. She has never used smokeless tobacco. She reports current alcohol use. She reports that she does not use drugs.   Family History:  Her Family history is unknown by patient.   Allergies Allergies  Allergen Reactions   Pepcid [Famotidine] Other (See Comments)    Mental status changes(Intolerance)   Omeprazole     Confusion (intolerance), Mental Status Changes     Home Medications  Prior to Admission medications   Medication Sig Start Date End Date Taking? Authorizing Provider  acetaminophen (TYLENOL) 650 MG CR tablet Take 1,300 mg by mouth every 8 (eight) hours as needed for pain.    [provider]  albuterol (VENTOLIN HFA) 108 (90 Base) MCG/ACT inhaler Inhale 1-2 puffs into the lungs every 4 (four) hours as needed for wheezing. 03/08/21   [provider]  amLODipine (NORVASC) 10 MG tablet Take 1 tablet (10 mg total) by mouth daily. 03/22/21 10/28/22  Lanae Boast, MD  amoxicillin (AMOXIL) 500 MG tablet Take 2g 1 hour prior to dental procedure 03/19/20   Tarry Kos, MD  atorvastatin (LIPITOR) 10 MG tablet Take 10 mg by mouth every evening.  05/14/18   [provider]  diclofenac Sodium (VOLTAREN) 1 % GEL Apply 2 g topically daily as needed (pain). 05/08/21   [provider]  ferric citrate (AURYXIA) 1 GM 210 MG(Fe) tablet Take 420 mg by mouth 3 (three) times daily with meals.    [provider]  fexofenadine (ALLEGRA) 180 MG tablet Take 180 mg by mouth daily as needed for allergies or rhinitis.    [provider]  labetalol (NORMODYNE) 200 MG tablet Take 200 mg by mouth 2 (two) times daily. 03/01/21   [provider]  lidocaine 4 % Place 1 patch onto the skin daily as needed (pain).    [provider]  lidocaine-prilocaine (EMLA) cream Apply 1 Application topically every Monday, Wednesday, and Friday with hemodialysis.    [provider]  loperamide (IMODIUM) 2 MG capsule Take 1 capsule (2 mg total) by mouth as needed for diarrhea or loose stools. 03/21/21   Lanae Boast, MD  losartan (COZAAR) 100 MG tablet Take 100 mg by mouth daily.    [provider]  multivitamin (RENA-VIT) TABS tablet Take 1 tablet by mouth at bedtime. 08/27/18   Dhungel, Theda Belfast, MD  oxyCODONE-acetaminophen (PERCOCET) 5-325 MG tablet Take 1 tablet by mouth every 4 (four) hours as needed for severe pain. 10/28/22 10/28/23  Chuck Hint, MD  pantoprazole (PROTONIX) 20 MG tablet Take 20 mg by mouth daily. 10/30/22   [provider]  polyethylene glycol (MIRALAX) 17 g packet Take 17 g by mouth daily as needed for moderate constipation. 08/27/18   Dhungel, Theda Belfast, MD  sertraline (ZOLOFT) 25 MG tablet Take 25 mg by mouth  daily. 04/15/22   [provider]     Critical care time:     CRITICAL CARE Performed by: Lanier Clam   Total critical care time: 45 minutes  Critical care time was exclusive of separately billable procedures and treating other patients. Critical care was necessary to treat or prevent imminent or life-threatening deterioration.  Critical care was time spent personally by me on the following activities: development of treatment plan with patient and/or surrogate as well as nursing, discussions with consultants, evaluation of patient's response to treatment, examination of patient, obtaining history from patient or surrogate, ordering and performing treatments and interventions, ordering and review of laboratory studies, ordering and review of radiographic studies, pulse oximetry and re-evaluation of patient's condition.  Tessie Fass MSN, AGACNP-BC [facility] Pulmonary/Critical Care Medicine Amion for pager 01/07/2023, 9:43 AM

## 2023-01-07 NOTE — Code Documentation (Addendum)
Reneshia Zuccaro is a 77 yr old female inpatient in 2 Heart s/p CABG. She was LKW before her surgery the evening of 01/09/2023. She was extubated earlier today, 0913 and was moving limbs wildly, not alert or appropriate. At 1220, following an episode of AF w RVR, Staff found her to have her eyes opened, but unable to move. Code stroke was activated. Not currently anticoagulated, unknown history of thinners.    Stroke team to pt's room prior to intubation. Pt is alert, nods, and grimaces, but unable to move all four limbs or speak. (NIHSS 29, please see documentation for NIHSS details and timeline). BP, CBG WNL. Intubated by CCM MD. Pt to CT with team. The following imaging was done: CT, CTA,P. Per Dr. Selina Cooley, CT is negative for hemorrhage, and CTA is negative for LVO or perfusion abnormality.     Pt back to 2H where her workup will continue. She will need q 2 hr NIHSS and VS for 12 hrs, then q 4, as well as a stroke swallow screen before any po intake. Bedside handoff with 2 H RN complete.

## 2023-01-07 NOTE — Progress Notes (Signed)
LTM EEG hooked up and running - no initial skin breakdown - push button tested - [facility] monitoring.  

## 2023-01-07 NOTE — Progress Notes (Addendum)
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 He underwent coronary artery bypass grafting on 11/5 and is admitted to the ICU postoperatively.  Jehovah's Witness.  Notified that the patient is persistently hypotensive.  Currently on norepinephrine and epinephrine.  Running through a central line.  Has persistent hyperkalemia, started on CRRT roughly 1.5 hours prior.  Temporizing measures given for hyperkalemia and CRRT started.  eICU Interventions  Albumin bolus infusing, additional 25 g of 25% ordered.  Use vasopressors as needed to maintain MAP greater than 65.  Temporary Trendelenburg positioning.  Adequate mechanics.    Not pulling on CRRT.  ABG consistent with worsening acidosis and hyperkalemia.  Add calcium, bicarbonate, and insulin/dextrose.  Low hemoglobin, defer transfusion for now.   2258 - increase frequency of lacrilube   Intervention Category Major Interventions: Shock - evaluation and management  Teryl Mcconaghy 01/07/2023, 7:49 PM

## 2023-01-07 NOTE — Progress Notes (Signed)
      [address]       [place] [address]             [phone]       Events of day noted  Code stroke due to unresponsiveness.  Head CT negative for acute stroke  Still unresponsive  Line just placed and started on CRRT  BP (!) 86/30   Pulse 68   Temp (!) 96.4 F (35.8 C)   Resp (!) 24   Ht 5\' 7"  (1.702 m)   Wt 67.7 kg   SpO2 99%   BMI 23.38 kg/m  Co-ox 58 Vasopressin 0.03, norepi 6.  Epi ordered, just being started [facility] 24/40%/ 5 PEEP  Intake/Output Summary (Last 24 hours) at 01/07/2023 1821 Last data filed at 01/07/2023 1600 Gross per 24 hour  Intake 5385.82 ml  Output 880 ml  Net 4505.82 ml   K 7.1 Hgb 6.1- Jehovah's witness  Critically ill Continue current care  Bianca Myers C. Dorris Fetch, MD [facility] [phone]

## 2023-01-07 NOTE — Progress Notes (Signed)
Patient transported to CT and back to room 2H25, on ventilator, without complications.

## 2023-01-07 NOTE — Consult Note (Signed)
Reason for Consult: Continuity of ESRD care Referring Physician: Bryan Lemma, MD (cardiology)  HPI:  77 year old woman with past medical history significant for hypertension, type 2 diabetes mellitus, anemia of chronic disease (Jeh[employer] Witness) and end-stage renal disease on hemodialysis.  Presented to the [facility] emergency room with 10 days of worsening chest pressure and shortness of breath following which she was diagnosed with non-ST elevation MI/acute diastolic heart failure/severe pulmonary hypertension with pulmonary edema (for which she had dialysis on 11/3 and 11/4 at [facility]) and emergently transferred to [facility] for coronary angiography.  Angiography showed severe left main disease and she subsequently underwent emergent three-vessel CABG yesterday.  She remains on low-dose pressor support and is in the process of being prepared for extubation when seen this morning.  Dialysis prescription:  [facility], MWF, 3 hours, BFR 400/DFR 600, EDW 61.9 kg, 2K/2.0 calcium, UF profile #2, left upper arm AV graft, Mircera 100 mcg every 2 weeks, calcitriol 1 mcg 3 times weekly, Venofer 100 mg q. dialysis.  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   left side   Depression    Diabetes mellitus (HCC)    Dyslipidemia 03/15/2021   Dyspnea    ESRD (end stage renal disease) on dialysis [facility])    Dialysis M/W/F   Gout    History of anemia due to chronic kidney disease    History of degenerative disc disease    Hypertension    Pneumonia    Refusal of blood transfusions as patient is Jehovah's Witness    Secondary hyperparathyroidism (HCC)    Spondylisthesis     Past Surgical History:  Procedure Laterality Date   A/V FISTULAGRAM Left 12/16/2018   Procedure: A/V FISTULAGRAM;  Surgeon: Cephus Shelling, MD;  Location: [facility] INVASIVE CV LAB;  Service: Cardiovascular;   Laterality: Left;   A/V FISTULAGRAM Left 06/13/2019   Procedure: A/V FISTULAGRAM;  Surgeon: Cephus Shelling, MD;  Location: [facility] INVASIVE CV LAB;  Service: Cardiovascular;  Laterality: Left;   AV FISTULA PLACEMENT Left 08/24/2018   Procedure: ARTERIOVENOUS (AV) FISTULA CREATION;  Surgeon: Maeola Harman, MD;  Location: [facility] OR;  Service: Vascular;  Laterality: Left;   AV FISTULA PLACEMENT Left 07/12/2019   Procedure: INSERTION OF LEFT UPPER ARM  ARTERIOVENOUS (AV) GORE-TEX GRAFT;  Surgeon: Sherren Kerns, MD;  Location: [facility] OR;  Service: Vascular;  Laterality: Left;   BASCILIC VEIN TRANSPOSITION Left 11/04/2018   Procedure: BASCILIC VEIN TRANSPOSITION SECOND STAGE;  Surgeon: Maeola Harman, MD;  Location: [facility] OR;  Service: Vascular;  Laterality: Left;   COLON SURGERY     COLONOSCOPY     FRACTURE SURGERY Left    wrist   HIP SURGERY     INSERTION OF DIALYSIS CATHETER N/A 06/15/2019   Procedure: INSERTION OF TUNNELED  DIALYSIS CATHETER RIGHT INTERNAL JUGULAR;  Surgeon: Nada Libman, MD;  Location: [facility] OR;  Service: Vascular;  Laterality: N/A;   IR FLUORO GUIDE CV LINE RIGHT  08/21/2018   IR FLUORO GUIDE CV LINE RIGHT  06/08/2019   IR PERC TUN PERIT CATH WO PORT S&I /IMAG  12/19/2019   IR REMOVAL TUN CV CATH W/O FL  03/11/2019   IR THROMBECTOMY AV FISTULA W/THROMBOLYSIS/PTA INC/SHUNT/IMG LEFT Left 12/21/2019   IR US GUIDE VASC ACCESS LEFT  12/21/2019   IR US GUIDE VASC ACCESS RIGHT  08/21/2018  IR US GUIDE VASC ACCESS RIGHT  06/08/2019   IR US GUIDE VASC ACCESS RIGHT  12/19/2019   LEFT HEART CATH AND CORONARY ANGIOGRAPHY N/A 01/07/2023   Procedure: LEFT HEART CATH AND CORONARY ANGIOGRAPHY;  Surgeon: Marykay Lex, MD;  Location: [facility] INVASIVE CV LAB;  Service: Cardiovascular;  Laterality: N/A;   PERIPHERAL VASCULAR BALLOON ANGIOPLASTY Left 06/13/2019   Procedure: PERIPHERAL VASCULAR BALLOON ANGIOPLASTY;  Surgeon: Cephus Shelling, MD;  Location: [facility] INVASIVE CV  LAB;  Service: Cardiovascular;  Laterality: Left;  AVF   PERIPHERAL VASCULAR INTERVENTION Left 12/16/2018   Procedure: PERIPHERAL VASCULAR INTERVENTION;  Surgeon: Cephus Shelling, MD;  Location: [facility] INVASIVE CV LAB;  Service: Cardiovascular;  Laterality: Left;  arm fistula   REVISON OF ARTERIOVENOUS FISTULA Left 10/28/2022   Procedure: REPAIR ULCER OVER LEFT ARM ARTERIOVENOUS FISTULA;  Surgeon: Chuck Hint, MD;  Location: [facility] OR;  Service: Vascular;  Laterality: Left;   SHOULDER SURGERY Right 2021   due to a bone infection   TOTAL HIP ARTHROPLASTY Left 06/10/2019   Procedure: TOTAL HIP ARTHROPLASTY ANTERIOR APPROACH;  Surgeon: Tarry Kos, MD;  Location: [facility] OR;  Service: Orthopedics;  Laterality: Left;    Family History  Family history unknown: Yes    Social History:  reports that she quit smoking about 51 years ago. Her smoking use included cigarettes. She has never used smokeless tobacco. She reports current alcohol use. She reports that she does not use drugs.  Allergies:  Allergies  Allergen Reactions   Pepcid [Famotidine] Other (See Comments)    Mental status changes(Intolerance)   Omeprazole     Confusion (intolerance), Mental Status Changes    Medications: I have reviewed the patient's current medications. Scheduled:  acetaminophen  1,000 mg Oral Q6H   Or   acetaminophen (TYLENOL) oral liquid 160 mg/5 mL  1,000 mg Per Tube Q6H   amiodarone  400 mg Oral BID   aspirin EC  325 mg Oral Daily   Or   aspirin  324 mg Per Tube Daily   atorvastatin  10 mg Oral QPM   bisacodyl  10 mg Oral Daily   Or   bisacodyl  10 mg Rectal Daily   Chlorhexidine Gluconate Cloth  6 each Topical Daily   docusate sodium  200 mg Oral Daily   ferric citrate  420 mg Oral TID WC   insulin aspart  0-24 Units Subcutaneous Q4H   metoCLOPramide (REGLAN) injection  10 mg Intravenous Q6H   metoprolol tartrate  12.5 mg Oral BID   Or   metoprolol tartrate  12.5 mg Per Tube BID   mouth  rinse  15 mL Mouth Rinse Q2H   [START ON 01/24/2023] pantoprazole  40 mg Oral Daily   pantoprazole (PROTONIX) IV  40 mg Intravenous QHS   sertraline  25 mg Oral Daily   sodium chloride flush  3 mL Intravenous Q12H   Continuous:  sodium chloride     sodium chloride     sodium chloride 10 mL/hr at 01/07/23 0800   albumin human      ceFAZolin (ANCEF) IV     dexmedetomidine (PRECEDEX) IV infusion 0.2 mcg/kg/hr (01/07/23 0800)   lactated ringers     lactated ringers 20 mL/hr at 01/07/23 0800   magnesium sulfate     norepinephrine (LEVOPHED) Adult infusion 1 mcg/min (01/07/23 0800)   vancomycin     vasopressin 0.04 Units/min (01/07/23 0800)      Latest Ref Rng & Units 01/07/2023  8:44 AM 01/07/2023    6:37 AM 01/07/2023    4:43 AM  BMP  Glucose 70 - 99 mg/dL   829   BUN 8 - 23 mg/dL   42   Creatinine 5.62 - 1.00 mg/dL   1.30   Sodium 865 - 784 mmol/L 133  132  134   Potassium 3.5 - 5.1 mmol/L 6.2  6.5  7.2   Chloride 98 - 111 mmol/L   100   CO2 22 - 32 mmol/L   21   Calcium 8.9 - 10.3 mg/dL   8.0       Latest Ref Rng & Units 01/07/2023    8:44 AM 01/07/2023    6:37 AM 01/07/2023    3:46 AM  CBC  Hemoglobin 12.0 - 15.0 g/dL 7.5  7.8  8.2   Hematocrit 36.0 - 46.0 % 22.0  23.0  24.0     DG Chest Port 1 View  Result Date: 01/02/2023 CLINICAL DATA:  Coronary artery bypass grafting EXAM: PORTABLE CHEST 1 VIEW COMPARISON:  05/04/2022 FINDINGS: Endotracheal tube is seen 2.8 cm above the carina. Nasogastric tube extends into the upper abdomen beyond the margin examination. Left internal jugular Swan-Ganz catheter tip is seen within the expected pulmonary arterial bifurcation. Biapical pulmonary scarring, more prevalent within the right upper lobe with associated volume loss, is stable since prior CT examination. Mediastinal drain and left chest tube are in place. No pneumothorax or pleural effusion. Coronary artery bypass grafting has been performed. Cardiac size is within normal limits.  Pulmonary vascularity is normal. No acute bone abnormality. IMPRESSION: 1. Support tubes in appropriate position. Left chest tube in place. No pneumothorax. 2. Stable biapical pulmonary scarring, more prevalent within the right upper lobe with associated volume loss. Electronically Signed   By: Helyn Numbers M.D.   On: 01/13/2023 23:26   ECHO INTRAOPERATIVE TEE  Result Date: 01/03/2023  *INTRAOPERATIVE TRANSESOPHAGEAL REPORT *  Patient Name:   RAHMAH MCCAMY Date of Exam: 01/23/2023 Medical Rec #:  [id number]         Height:       67.0 in Accession #:    [id number]        Weight:       140.0 lb Date of Birth:  [birth year]        BSA:          1.74 m Patient Age:    77 years          BP:           138/53 mmHg Patient Gender: F                 HR:           69 bpm. Exam Location:  Anesthesiology Transesophogeal exam was perform intraoperatively during surgical procedure. Patient was closely monitored under general anesthesia during the entirety of examination. Indications:     R07.9* Chest pain, unspecified Performing Phys: [id number] Eugenio Hoes Complications: No known complications during this procedure. POST-OP IMPRESSIONS _ Left Ventricle: has normal systolic function, with an ejection fraction of 60%. The cavity size was normal. The wall motion is normal. _ Right Ventricle: The right ventricle appears unchanged from pre-bypass normal function. _ Aorta: The aorta appears unchanged from pre-bypass. _ Left Atrial Appendage: The left atrial appendage appears unchanged from pre-bypass. _ Aortic Valve: The aortic valve appears unchanged from pre-bypass. There is mild regurgitation. _ Mitral Valve: There is mild regurgitation. _ Tricuspid Valve: There is mild regurgitation.  _ Interatrial Septum: The interatrial septum appears unchanged from pre-bypass. _ Pericardium: The pericardium appears unchanged from pre-bypass. _ Comments: Post-bypass images reviewed with surgeon. PRE-OP FINDINGS  Left Ventricle: The left  ventricle has normal systolic function, with an ejection fraction of 55-60%. The cavity size was normal. There is mild concentric left ventricular hypertrophy. There is mild anterior wall hypokinesis. Right Ventricle: The right ventricle has normal systolic function. The cavity was normal. There is no increase in right ventricular wall thickness. Left Atrium: Left atrial size was dilated. No left atrial/left atrial appendage thrombus was detected. Right Atrium: Right atrial size was normal in size. Right atrial pressure is estimated at 15 mmHg. Interatrial Septum: No atrial level shunt detected by color flow Doppler. Pericardium: There is no evidence of pericardial effusion. Mitral Valve: The mitral valve is normal in structure. Mitral valve regurgitation mild-moderate. The MR jet is centrally-directed. Tricuspid Valve: The tricuspid valve was normal in structure. Tricuspid valve regurgitation is mild by color flow Doppler. Aortic Valve: The aortic valve is tricuspid Aortic valve regurgitation is mild by color flow Doppler. There is mild stenosis of the aortic valve. Mildly restricted left coronary cusp. Pulmonic Valve: The pulmonic valve was normal in structure. Pulmonic valve regurgitation is trivial by color flow Doppler. Aorta: The aortic root and ascending aorta are normal in size and structure. There is evidence of plaque in the descending aorta. +---------------+------+-------+ RIGHT VENTRICLE              +---------------+------+-------+ TAPSE (M-mode):3.3 cm2.37 cm +---------------+------+-------+ +------------------+------------++ AORTIC VALVE                   +------------------+------------++ AV Vmax:          252.00 cm/s  +------------------+------------++ AV Vmean:         159.000 cm/s +------------------+------------++ AV VTI:           0.602 m      +------------------+------------++ AV Peak Grad:     25.4 mmHg    +------------------+------------++ AV Mean Grad:     12.5  mmHg    +------------------+------------++ LVOT Vmax:        84.60 cm/s   +------------------+------------++ LVOT Vmean:       59.100 cm/s  +------------------+------------++ LVOT VTI:         0.182 m      +------------------+------------++ LVOT/AV VTI ratio:0.30         +------------------+------------++ +---------------+-----------++ TRICUSPID VALVE            +---------------+-----------++ TR Peak grad:  54.2 mmHg   +---------------+-----------++ TR Vmax:       368.00 cm/s +---------------+-----------++  +-------------+------+ SHUNTS              +-------------+------+ Systemic VTI:0.18 m +-------------+------+  Arrie Aran MD Electronically signed by Arrie Aran MD Signature Date/Time: 01/23/2023/10:29:00 PM    Final    EP STUDY  Result Date: 01/31/2023 See surgical note for result.  CARDIAC CATHETERIZATION  Result Date: 01/16/2023   Mid LM to Ost LAD lesion is 95% stenosed with 65% stenosed side branch in Ost Cx to Prox Cx.   1st Mrg lesion is 80% stenosed.   Ramus lesion is 70% stenosed.   Prox LAD lesion is 50% stenosed with 50% stenosed side branch in 1st Diag.   Prox RCA to Mid RCA lesion is 70% stenosed.   RPDA lesion is 80% stenosed.   There is mild aortic valve stenosis. POST-OPERATIVE DIAGNOSIS:  Severe multivessel disease: Distal left main 90% stenosis  involving ostial LCx 70% and small RI 70% stenoses. Proximal LAD-D1 bifurcation roughly 50% stenosis Mid OM1 80% eccentric Mid RCA at RV marginal branch focal 70%; mid PDA eccentric 80% LVEDP 24 - 29 mmHg with no clear Aortic Valve Gradient RECOMMENDATIONS   Patient will be taken directly to the Cath Lab to the OR for emergent CABG   Recommend Aspirin 81mg  daily for moderate CAD.   Would strongly consider Plavix post CABG prior to discharge Bryan Lemma, MD   Review of Systems  Unable to perform ROS: Intubated   Blood pressure 106/80, pulse 90, temperature 98.6 F (37 C), resp. rate 17, height 5\' 7"   (1.702 m), weight 67.7 kg, SpO2 100%. Physical Exam Vitals and nursing note reviewed.  Constitutional:      General: She is not in acute distress.    Appearance: She is not ill-appearing.     Comments: Appears restless/agitated while intubated  HENT:     Head: Normocephalic and atraumatic.     Nose: Nose normal.     Mouth/Throat:     Comments: Intubated Eyes:     Conjunctiva/sclera: Conjunctivae normal.     Pupils: Pupils are equal, round, and reactive to light.  Cardiovascular:     Rate and Rhythm: Normal rate and regular rhythm.     Pulses: Normal pulses.     Heart sounds: Normal heart sounds.  Pulmonary:     Breath sounds: Normal breath sounds.  Abdominal:     General: Bowel sounds are normal.     Palpations: Abdomen is soft.     Comments: Colostomy with intact bag in situ  Musculoskeletal:     Cervical back: Normal range of motion.     Right lower leg: No edema.     Left lower leg: No edema.  Skin:    General: Skin is warm and dry.  Neurological:     Mental Status: She is alert.    Assessment/Plan: 1.  Coronary artery disease: Status post emergent three-vessel CABG yesterday to address critical stenosis of distal left main/LAD and acute diastolic heart failure/pulmonary hypertension.  On low-dose pressors. 2.  End-stage renal disease: Continue hemodialysis on a Monday/Wednesday/Friday schedule as as permitted by blood pressures with judicious ultrafiltration to maintain current fluid balance. 3.  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  Will continue to monitor for ESA support. 4.  Secondary hyperparathyroidism: Will follow calcium and phosphorus levels to make decisions regarding phosphorus binder therapy +/- renal diet. 5.  Hyperkalemia: Likely secondary to vasoplegic therapy +/- tissue injury with surgery.  Manage definitively with dialysis.  Dagoberto Ligas 01/07/2023, 9:05 AM

## 2023-01-07 NOTE — Progress Notes (Signed)
Site area- right  Site Prior to Removal- 0   Pressure Applied For-  25 MInutes   Bedrest Beginning at - 1500   Manual- Yes   Patient Status During Pull- Stable    Post Pull Groin Site- 0   Post Pull Instructions Given- Patient intubated.    Post Pull Pulses Present- Yes Right PT by doppler    Dressing Applied- Tegaderm and Gauze Dressing    Comments: See previous note regarding aspiration of clot and kinking of sheath. Per Dr. Rosemary Holms, remove sheath following A-line placement. A line obtained and sheath removed. Patient had not had heparin since yesterday. Aspiration prior to sheath removal without issues.Sheath removed. 25 minutes of manual pressure held. Sheath noted to be kinked in 2 places following removal. Site level 0, bruising noted distal to site from surgical site, confirmed with nurse previously present. No complications during sheath removal with site. Site soft and level 0.

## 2023-01-08 ENCOUNTER — Inpatient Hospital Stay (HOSPITAL_COMMUNITY): Payer: Medicare HMO

## 2023-01-08 DIAGNOSIS — N186 End stage renal disease: Secondary | ICD-10-CM | POA: Diagnosis not present

## 2023-01-08 DIAGNOSIS — I509 Heart failure, unspecified: Secondary | ICD-10-CM | POA: Diagnosis not present

## 2023-01-08 DIAGNOSIS — R579 Shock, unspecified: Secondary | ICD-10-CM

## 2023-01-08 DIAGNOSIS — D691 Qualitative platelet defects: Secondary | ICD-10-CM | POA: Diagnosis not present

## 2023-01-08 DIAGNOSIS — R570 Cardiogenic shock: Secondary | ICD-10-CM

## 2023-01-08 DIAGNOSIS — E872 Acidosis, unspecified: Secondary | ICD-10-CM

## 2023-01-08 DIAGNOSIS — D696 Thrombocytopenia, unspecified: Secondary | ICD-10-CM

## 2023-01-08 DIAGNOSIS — E875 Hyperkalemia: Secondary | ICD-10-CM

## 2023-01-08 DIAGNOSIS — Z7189 Other specified counseling: Secondary | ICD-10-CM

## 2023-01-08 DIAGNOSIS — R569 Unspecified convulsions: Secondary | ICD-10-CM | POA: Diagnosis not present

## 2023-01-08 DIAGNOSIS — D689 Coagulation defect, unspecified: Secondary | ICD-10-CM

## 2023-01-08 DIAGNOSIS — K7200 Acute and subacute hepatic failure without coma: Secondary | ICD-10-CM

## 2023-01-08 DIAGNOSIS — I25110 Atherosclerotic heart disease of native coronary artery with unstable angina pectoris: Secondary | ICD-10-CM | POA: Diagnosis not present

## 2023-01-08 DIAGNOSIS — G9340 Encephalopathy, unspecified: Secondary | ICD-10-CM

## 2023-01-08 DIAGNOSIS — D649 Anemia, unspecified: Secondary | ICD-10-CM

## 2023-01-08 DIAGNOSIS — J9601 Acute respiratory failure with hypoxia: Secondary | ICD-10-CM | POA: Diagnosis not present

## 2023-01-08 DIAGNOSIS — R079 Chest pain, unspecified: Secondary | ICD-10-CM | POA: Diagnosis not present

## 2023-01-08 DIAGNOSIS — Z66 Do not resuscitate: Secondary | ICD-10-CM

## 2023-01-08 LAB — POCT I-STAT 7, (LYTES, BLD GAS, ICA,H+H)
Acid-base deficit: 10 mmol/L — ABNORMAL HIGH (ref 0.0–2.0)
Bicarbonate: 15.5 mmol/L — ABNORMAL LOW (ref 20.0–28.0)
Calcium, Ion: 1.09 mmol/L — ABNORMAL LOW (ref 1.15–1.40)
HCT: 17 % — ABNORMAL LOW (ref 36.0–46.0)
Hemoglobin: 5.8 g/dL — CL (ref 12.0–15.0)
O2 Saturation: 99 %
Patient temperature: 36.8
Potassium: 6.6 mmol/L (ref 3.5–5.1)
Sodium: 133 mmol/L — ABNORMAL LOW (ref 135–145)
TCO2: 16 mmol/L — ABNORMAL LOW (ref 22–32)
pCO2 arterial: 30.3 mm[Hg] — ABNORMAL LOW (ref 32–48)
pH, Arterial: 7.316 — ABNORMAL LOW (ref 7.35–7.45)
pO2, Arterial: 163 mm[Hg] — ABNORMAL HIGH (ref 83–108)

## 2023-01-08 LAB — COOXEMETRY PANEL
Carboxyhemoglobin: 0.6 % (ref 0.5–1.5)
Methemoglobin: <0.7 % (ref 0.0–1.5)
O2 Saturation: 62.7 %
Total hemoglobin: <6.9 g/dL — CL (ref 12.0–16.0)

## 2023-01-08 LAB — GLUCOSE, CAPILLARY
Glucose-Capillary: 124 mg/dL — ABNORMAL HIGH (ref 70–99)
Glucose-Capillary: 28 mg/dL — CL (ref 70–99)
Glucose-Capillary: <10 mg/dL — CL (ref 70–99)
Glucose-Capillary: 204 mg/dL — ABNORMAL HIGH (ref 70–99)
Glucose-Capillary: 102 mg/dL — ABNORMAL HIGH (ref 70–99)
Glucose-Capillary: 72 mg/dL (ref 70–99)

## 2023-01-08 LAB — POTASSIUM
Potassium: 5.9 mmol/L — ABNORMAL HIGH (ref 3.5–5.1)
Potassium: 6.2 mmol/L — ABNORMAL HIGH (ref 3.5–5.1)

## 2023-01-08 LAB — HEPATIC FUNCTION PANEL
ALT: 3402 U/L — ABNORMAL HIGH (ref 0–44)
AST: 5998 U/L — ABNORMAL HIGH (ref 15–41)
Albumin: 2.3 g/dL — ABNORMAL LOW (ref 3.5–5.0)
Alkaline Phosphatase: 98 U/L (ref 38–126)
Bilirubin, Direct: 0.6 mg/dL — ABNORMAL HIGH (ref 0.0–0.2)
Indirect Bilirubin: 0.8 mg/dL (ref 0.3–0.9)
Total Bilirubin: 1.4 mg/dL — ABNORMAL HIGH (ref <1.2)
Total Protein: 4.3 g/dL — ABNORMAL LOW (ref 6.5–8.1)

## 2023-01-08 LAB — RENAL FUNCTION PANEL
Albumin: 2.9 g/dL — ABNORMAL LOW (ref 3.5–5.0)
Anion gap: 15 (ref 5–15)
BUN: 27 mg/dL — ABNORMAL HIGH (ref 8–23)
CO2: 15 mmol/L — ABNORMAL LOW (ref 22–32)
Calcium: 8.2 mg/dL — ABNORMAL LOW (ref 8.9–10.3)
Chloride: 104 mmol/L (ref 98–111)
Creatinine, Ser: 3.59 mg/dL — ABNORMAL HIGH (ref 0.44–1.00)
GFR, Estimated: 13 mL/min — ABNORMAL LOW (ref >60)
Glucose, Bld: 30 mg/dL — CL (ref 70–99)
Phosphorus: 4.4 mg/dL (ref 2.5–4.6)
Potassium: 6.8 mmol/L (ref 3.5–5.1)
Sodium: 134 mmol/L — ABNORMAL LOW (ref 135–145)

## 2023-01-08 LAB — MAGNESIUM: Magnesium: 2.4 mg/dL (ref 1.7–2.4)

## 2023-01-08 LAB — LIPID PANEL
Cholesterol: 61 mg/dL (ref 0–200)
HDL: 31 mg/dL — ABNORMAL LOW (ref >40)
LDL Cholesterol: 16 mg/dL (ref 0–99)
Total CHOL/HDL Ratio: 2 {ratio}
Triglycerides: 68 mg/dL (ref <150)
VLDL: 14 mg/dL (ref 0–40)

## 2023-01-08 LAB — ECHOCARDIOGRAM COMPLETE
AR max vel: 1.03 cm2
AV Area VTI: 1.07 cm2
AV Area mean vel: 0.95 cm2
AV Mean grad: 18 mm[Hg]
AV Peak grad: 36 mm[Hg]
Ao pk vel: 3 m/s
Height: 67 in
S' Lateral: 2.7 cm
Single Plane A4C EF: 39.3 %
Weight: 2388.02 [oz_av]

## 2023-01-08 LAB — DIC (DISSEMINATED INTRAVASCULAR COAGULATION)PANEL
D-Dimer, Quant: >20 ug{FEU}/mL — ABNORMAL HIGH (ref 0.00–0.50)
Fibrinogen: 79 mg/dL — CL (ref 210–475)
INR: 7.3 (ref 0.8–1.2)
Platelets: 34 10*3/uL — ABNORMAL LOW (ref 150–400)
Prothrombin Time: 62.2 s — ABNORMAL HIGH (ref 11.4–15.2)
Smear Review: NONE SEEN
aPTT: 31 s (ref 24–36)

## 2023-01-08 LAB — HEPATITIS B SURFACE ANTIGEN: Hepatitis B Surface Ag: NONREACTIVE

## 2023-01-08 MED ORDER — SODIUM BICARBONATE 8.4 % IV SOLN
INTRAVENOUS | Status: AC
  Administered 2023-01-08: 50 meq via INTRAVENOUS
  Filled 2023-01-08: qty 150

## 2023-01-08 MED ORDER — DEXTROSE 50 % IV SOLN
INTRAVENOUS | Status: AC
  Administered 2023-01-08: 50 g via INTRAVENOUS
  Filled 2023-01-08: qty 100

## 2023-01-08 MED ORDER — SODIUM BICARBONATE 8.4 % IV SOLN: 50.0000 meq | Freq: Once | INTRAVENOUS | Status: AC

## 2023-01-08 MED ORDER — DEXTROSE 50 % IV SOLN
INTRAVENOUS | Status: AC
  Filled 2023-01-08: qty 50

## 2023-01-08 MED ORDER — STERILE WATER FOR INJECTION IV SOLN
INTRAVENOUS | Status: DC
  Filled 2023-01-08 (×2): qty 150

## 2023-01-08 MED ORDER — DEXTROSE 10 % IV SOLN: INTRAVENOUS | Status: DC

## 2023-01-08 MED ORDER — DEXTROSE 50 % IV SOLN: 50.0000 g | Freq: Once | INTRAVENOUS | Status: AC

## 2023-01-08 MED ORDER — DEXTROSE 50 % IV SOLN: 50.0000 mL | Freq: Once | INTRAVENOUS | Status: AC

## 2023-01-08 MED ORDER — PRISMASOL BGK 0/2.5 32-2.5 MEQ/L EC SOLN
Status: DC
  Filled 2023-01-08 (×2): qty 5000

## 2023-01-08 MED ORDER — CALCIUM GLUCONATE-NACL 2-0.675 GM/100ML-% IV SOLN
2.0000 g | Freq: Once | INTRAVENOUS | Status: AC
  Administered 2023-01-08: 2000 mg via INTRAVENOUS
  Filled 2023-01-08: qty 100

## 2023-01-08 MED ORDER — SODIUM BICARBONATE 8.4 % IV SOLN
50.0000 meq | Freq: Once | INTRAVENOUS | Status: AC
  Administered 2023-01-08: 50 meq via INTRAVENOUS

## 2023-01-08 MED ORDER — SODIUM BICARBONATE 8.4 % IV SOLN: 100.0000 meq | Freq: Once | INTRAVENOUS | Status: AC

## 2023-01-08 MED ORDER — DEXTROSE 50 % IV SOLN
INTRAVENOUS | Status: AC
  Administered 2023-01-08: 50 mL via INTRAVENOUS
  Filled 2023-01-08: qty 50

## 2023-01-08 MED ORDER — SODIUM BICARBONATE 8.4 % IV SOLN
100.0000 meq | Freq: Once | INTRAVENOUS | Status: AC
  Administered 2023-01-08: 100 meq via INTRAVENOUS

## 2023-01-08 MED ORDER — MIDAZOLAM HCL 2 MG/2ML IJ SOLN
2.0000 mg | INTRAMUSCULAR | Status: DC | PRN
  Administered 2023-01-08: 2 mg via INTRAVENOUS
  Filled 2023-01-08: qty 2

## 2023-01-08 MED ORDER — ACETAMINOPHEN 650 MG RE SUPP: 650.0000 mg | Freq: Four times a day (QID) | RECTAL | Status: DC | PRN

## 2023-01-08 MED ORDER — SODIUM BICARBONATE 8.4 % IV SOLN
INTRAVENOUS | Status: AC
  Administered 2023-01-08: 100 meq via INTRAVENOUS
  Filled 2023-01-08: qty 100

## 2023-01-08 MED ORDER — GLYCOPYRROLATE 0.2 MG/ML IJ SOLN
0.2000 mg | INTRAMUSCULAR | Status: DC | PRN
  Administered 2023-01-08: 0.2 mg via INTRAVENOUS
  Filled 2023-01-08: qty 1

## 2023-01-08 MED ORDER — ACETAMINOPHEN 325 MG PO TABS: 650.0000 mg | ORAL_TABLET | Freq: Four times a day (QID) | ORAL | Status: DC | PRN

## 2023-01-08 MED ORDER — SODIUM BICARBONATE 8.4 % IV SOLN
INTRAVENOUS | Status: AC
  Administered 2023-01-08: 50 meq via INTRAVENOUS
  Filled 2023-01-08: qty 200

## 2023-01-08 MED ORDER — SODIUM BICARBONATE 8.4 % IV SOLN
INTRAVENOUS | Status: AC
  Administered 2023-01-08: 50 meq
  Filled 2023-01-08: qty 100

## 2023-01-08 MED ORDER — DEXTROSE 50 % IV SOLN: 25.0000 g | INTRAVENOUS | Status: AC

## 2023-01-08 MED ORDER — MORPHINE SULFATE (PF) 2 MG/ML IV SOLN
2.0000 mg | INTRAVENOUS | Status: DC | PRN
Start: 2023-01-08 — End: 2023-01-08

## 2023-01-08 MED ORDER — ALBUMIN HUMAN 25 % IV SOLN
25.0000 g | Freq: Once | INTRAVENOUS | Status: AC
  Administered 2023-01-08: 12.5 g via INTRAVENOUS
  Filled 2023-01-08: qty 100

## 2023-01-08 MED ORDER — GLYCOPYRROLATE 1 MG PO TABS: 1.0000 mg | ORAL_TABLET | ORAL | Status: DC | PRN

## 2023-01-08 MED ORDER — PRISMASOL BGK 0/2.5 32-2.5 MEQ/L EC SOLN
Status: DC
  Filled 2023-01-08 (×5): qty 5000

## 2023-01-08 MED ORDER — DEXTROSE 50 % IV SOLN
INTRAVENOUS | Status: AC
  Administered 2023-01-08: 25 g via INTRAVENOUS
  Filled 2023-01-08: qty 50

## 2023-01-08 MED ORDER — SODIUM CHLORIDE 0.9 % IV SOLN
3.0000 g | Freq: Three times a day (TID) | INTRAVENOUS | Status: DC
  Administered 2023-01-08: 3 g via INTRAVENOUS
  Filled 2023-01-08: qty 8

## 2023-01-08 MED ORDER — GLYCOPYRROLATE 0.2 MG/ML IJ SOLN: 0.2000 mg | INTRAMUSCULAR | Status: DC | PRN

## 2023-01-08 MED ORDER — SODIUM BICARBONATE 8.4 % IV SOLN
INTRAVENOUS | Status: DC
  Filled 2023-01-08: qty 1000

## 2023-01-08 MED ORDER — SODIUM BICARBONATE 8.4 % IV SOLN
INTRAVENOUS | Status: AC
  Administered 2023-01-08: 50 meq via INTRAVENOUS
  Filled 2023-01-08: qty 100

## 2023-01-09 LAB — HEPATITIS B SURFACE ANTIBODY, QUANTITATIVE: Hep B S AB Quant (Post): 16 m[IU]/mL

## 2023-01-12 MED FILL — Heparin Sodium (Porcine) Inj 1000 Unit/ML: INTRAMUSCULAR | Qty: 20 | Status: AC

## 2023-01-12 MED FILL — Sodium Bicarbonate IV Soln 8.4%: INTRAVENOUS | Qty: 100 | Status: AC

## 2023-01-12 MED FILL — Lidocaine HCl Local Soln Prefilled Syringe 100 MG/5ML (2%): INTRAMUSCULAR | Qty: 5 | Status: AC

## 2023-01-12 MED FILL — Sodium Chloride IV Soln 0.9%: INTRAVENOUS | Qty: 2000 | Status: AC

## 2023-01-12 MED FILL — Mannitol IV Soln 20%: INTRAVENOUS | Qty: 500 | Status: AC

## 2023-01-12 MED FILL — Electrolyte-R (PH 7.4) Solution: INTRAVENOUS | Qty: 4000 | Status: AC

## 2023-01-12 MED FILL — Calcium Chloride Inj 10%: INTRAVENOUS | Qty: 10 | Status: AC

## 2023-02-01 NOTE — Progress Notes (Signed)
Patient will be transitioning. Tech is going to claim equipment and Rns will remove wires. Patient is pasted. Rns made aware

## 2023-02-01 NOTE — Progress Notes (Signed)
PT Cancellation Note  Patient Details Name: Bianca Myers MRN: [id number] DOB: 1945/07/21   Cancelled Treatment:    Reason Eval/Treat Not Completed: Patient not medically ready (will sign off and await new order)   Arrayah Connors B Lamya Lausch 01/11/2023, 7:58 AM Merryl Hacker, PT Acute Rehabilitation Services Office: [phone]

## 2023-02-01 NOTE — IPAL (Signed)
  Interdisciplinary Goals of Care Family Meeting   Date carried out: 01/24/2023  Location of the meeting: Bedside  Member's involved: Nurse Practitioner, Bedside Registered Nurse, and Family Member or next of kin  Durable Power of Attorney or acting medical decision maker: Son Gabbs     Discussion: We discussed goals of care for [employer] .  Darya has continued to decline over the course of the night and morning despite aggressive support measures to temporize and correct underlying processes.   Jermaine and I spoke about the possibility of Shawne's heart stopping and about code status. If Chieko's heart were to stop despite current measures, I recommend DNR status, which her son is in complete agreement with.   Vaughan Basta also shared that his mom would not want to die on machines if possible. We talked about overall GOC, including comfort care.    I shared my worry that this is likely not survivable, which her son understands but knows his mom has fought through critical illness in other circumstances.   We will continue aggressive support measures while we garner an overall trend of if our temporizing measures are at all successful. If she continues to decline in interim or it becomes clear that this is not reversible Jermaine would like to transition to comfort care so that she does not die on machines. We will make every effort to do so in that case, but Vaughan Basta is also vunderstanding that there are instances where patients arrest before that transition can be made.   Code status:   Code Status: Do not attempt resuscitation (DNR) PRE-ARREST INTERVENTIONS DESIRED   Disposition: Continue current acute care  Time spent for the meeting:     Lanier Clam, NP  01/23/2023, 9:29 AM

## 2023-02-01 NOTE — Consult Note (Addendum)
Advanced Heart Failure Team Consult Note   Primary Physician: Everlean Cherry, MD PCP-Cardiologist:  Norman Herrlich, MD  Reason for Consultation: Cardiogenic Shock  HPI:    Bianca Myers is seen today for evaluation of cardiogenic  at the request of Dr Leafy Ro.  Patient is encephalopathic and or intubated. Therefore history has been obtained from chart review.   Bianca Myers is a 77 year old with a history of ESRD, DM, anemia, CAD, PAF, pulmonary hypertension,  and Jehovahs witness.   Presented to [facility] with shortness of breath.  Troponin elevated. Transferred to [facility] for cath.  Cath with severe multivessel disease, critical left main disease. CT surgery consulted for emergent CABG. Intra-op Echo  LV 60%.  On 11/5 had CABG x3 - LIMA to LAD and RSVG from aorta to PDA and from aorta to OM. POD 1 able to wean pressors a little. Nephrology consulted. Extubated on POD 2. Later that morning unresponsive and had A fib RVR. Given amio bolus. Code stroke called. Placed on Bipap but ultimately re intubated. CT negative for acute hemorrhage.  Started on CRRT. Became hypotensive with increased pressor requirements.   Currently on epi 10 mcg, norepi 32 mcg, and Vaso 0.04 units. Hgb 5.8. Plts < 40.    Home Medications Prior to Admission medications   Medication Sig Start Date End Date Taking? Authorizing Provider  acetaminophen (TYLENOL) 650 MG CR tablet Take 1,300 mg by mouth every 8 (eight) hours as needed for pain.   Yes [provider]  albuterol (VENTOLIN HFA) 108 (90 Base) MCG/ACT inhaler Inhale 1-2 puffs into the lungs every 4 (four) hours as needed for wheezing. 03/08/21  Yes [provider]  amLODipine (NORVASC) 10 MG tablet Take 1 tablet (10 mg total) by mouth daily. 03/22/21 01/07/23 Yes Lanae Boast, MD  amoxicillin (AMOXIL) 500 MG tablet Take 2g 1 hour prior to dental procedure 03/19/20  Yes Tarry Kos, MD  atorvastatin (LIPITOR) 10 MG tablet Take 10 mg by mouth  every evening.  05/14/18  Yes [provider]  diclofenac Sodium (VOLTAREN) 1 % GEL Apply 2 g topically daily as needed (pain). 05/08/21  Yes [provider]  ferric citrate (AURYXIA) 1 GM 210 MG(Fe) tablet Take 420 mg by mouth 3 (three) times daily with meals.   Yes [provider]  fexofenadine (ALLEGRA) 180 MG tablet Take 180 mg by mouth daily as needed for allergies or rhinitis.   Yes [provider]  labetalol (NORMODYNE) 200 MG tablet Take 200 mg by mouth 2 (two) times daily. 03/01/21  Yes [provider]  lidocaine 4 % Place 1 patch onto the skin daily as needed (pain).   Yes [provider]  lidocaine-prilocaine (EMLA) cream Apply 1 Application topically every Monday, Wednesday, and Friday with hemodialysis.   Yes [provider]  loperamide (IMODIUM) 2 MG capsule Take 1 capsule (2 mg total) by mouth as needed for diarrhea or loose stools. 03/21/21  Yes Lanae Boast, MD  losartan (COZAAR) 100 MG tablet Take 100 mg by mouth daily.   Yes [provider]  multivitamin (RENA-VIT) TABS tablet Take 1 tablet by mouth at bedtime. 08/27/18  Yes Dhungel, Nishant, MD  pantoprazole (PROTONIX) 40 MG tablet Take 40 mg by mouth daily.   Yes [provider]  polyethylene glycol (MIRALAX) 17 g packet Take 17 g by mouth daily as needed for moderate constipation. 08/27/18  Yes Dhungel, Nishant, MD  sertraline (ZOLOFT) 25 MG tablet Take  25 mg by mouth daily. 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   left side   Depression    Diabetes mellitus (HCC)    Dyslipidemia 03/15/2021   Dyspnea    ESRD (end stage renal disease) on dialysis [facility])    Dialysis M/W/F   Gout    History of anemia due to chronic kidney disease    History of degenerative disc disease    Hypertension    Pneumonia     Refusal of blood transfusions as patient is Jehovah's Witness    Secondary hyperparathyroidism (HCC)    Spondylisthesis     Past Surgical History: Past Surgical History:  Procedure Laterality Date   A/V FISTULAGRAM Left 12/16/2018   Procedure: A/V FISTULAGRAM;  Surgeon: Cephus Shelling, MD;  Location: [facility] INVASIVE CV LAB;  Service: Cardiovascular;  Laterality: Left;   A/V FISTULAGRAM Left 06/13/2019   Procedure: A/V FISTULAGRAM;  Surgeon: Cephus Shelling, MD;  Location: [facility] INVASIVE CV LAB;  Service: Cardiovascular;  Laterality: Left;   AV FISTULA PLACEMENT Left 08/24/2018   Procedure: ARTERIOVENOUS (AV) FISTULA CREATION;  Surgeon: Maeola Harman, MD;  Location: [facility] OR;  Service: Vascular;  Laterality: Left;   AV FISTULA PLACEMENT Left 07/12/2019   Procedure: INSERTION OF LEFT UPPER ARM  ARTERIOVENOUS (AV) GORE-TEX GRAFT;  Surgeon: Sherren Kerns, MD;  Location: [facility] OR;  Service: Vascular;  Laterality: Left;   BASCILIC VEIN TRANSPOSITION Left 11/04/2018   Procedure: BASCILIC VEIN TRANSPOSITION SECOND STAGE;  Surgeon: Maeola Harman, MD;  Location: [facility] OR;  Service: Vascular;  Laterality: Left;   COLON SURGERY     COLONOSCOPY     CORONARY ARTERY BYPASS GRAFT N/A 01/13/2023   Procedure: CORONARY ARTERY BYPASS GRAFTING (CABG) x3 USING LEFT INTERNAL MAMMARY ARTERY AND HARVESTED RIGHT GREATER SAPHENOUS VEIN ENDOSCOPICALLY;  Surgeon: Eugenio Hoes, MD;  Location: [facility] OR;  Service: Open Heart Surgery;  Laterality: N/A;   FRACTURE SURGERY Left    wrist   HIP SURGERY     INSERTION OF DIALYSIS CATHETER N/A 06/15/2019   Procedure: INSERTION OF TUNNELED  DIALYSIS CATHETER RIGHT INTERNAL JUGULAR;  Surgeon: Nada Libman, MD;  Location: [facility] OR;  Service: Vascular;  Laterality: N/A;   IR FLUORO GUIDE CV LINE RIGHT  08/21/2018   IR FLUORO GUIDE CV LINE RIGHT  06/08/2019   IR PERC TUN PERIT CATH WO PORT S&I /IMAG  12/19/2019   IR REMOVAL TUN CV CATH W/O FL  03/11/2019    IR THROMBECTOMY AV FISTULA W/THROMBOLYSIS/PTA INC/SHUNT/IMG LEFT Left 12/21/2019   IR US GUIDE VASC ACCESS LEFT  12/21/2019   IR US GUIDE VASC ACCESS RIGHT  08/21/2018   IR US GUIDE VASC ACCESS RIGHT  06/08/2019   IR US GUIDE VASC ACCESS RIGHT  12/19/2019   LEFT HEART CATH AND CORONARY ANGIOGRAPHY N/A 01/19/2023   Procedure: LEFT HEART CATH AND CORONARY ANGIOGRAPHY;  Surgeon: Marykay Lex, MD;  Location: [facility] INVASIVE CV LAB;  Service: Cardiovascular;  Laterality: N/A;   PERIPHERAL VASCULAR BALLOON ANGIOPLASTY Left 06/13/2019   Procedure: PERIPHERAL VASCULAR BALLOON ANGIOPLASTY;  Surgeon: Cephus Shelling, MD;  Location: [facility] INVASIVE CV LAB;  Service: Cardiovascular;  Laterality: Left;  AVF   PERIPHERAL VASCULAR INTERVENTION Left 12/16/2018   Procedure: PERIPHERAL VASCULAR INTERVENTION;  Surgeon: Cephus Shelling, MD;  Location:  [facility] INVASIVE CV LAB;  Service: Cardiovascular;  Laterality: Left;  arm fistula   REVISON OF ARTERIOVENOUS FISTULA Left 10/28/2022   Procedure: REPAIR ULCER OVER LEFT ARM ARTERIOVENOUS FISTULA;  Surgeon: Chuck Hint, MD;  Location: [facility] OR;  Service: Vascular;  Laterality: Left;   SHOULDER SURGERY Right 2021   due to a bone infection   TEE WITHOUT CARDIOVERSION N/A 01/20/2023   Procedure: TRANSESOPHAGEAL ECHOCARDIOGRAM;  Surgeon: Eugenio Hoes, MD;  Location: [facility] OR;  Service: Open Heart Surgery;  Laterality: N/A;   TOTAL HIP ARTHROPLASTY Left 06/10/2019   Procedure: TOTAL HIP ARTHROPLASTY ANTERIOR APPROACH;  Surgeon: Tarry Kos, MD;  Location: [facility] OR;  Service: Orthopedics;  Laterality: Left;    Family History: Family History  Family history unknown: Yes    Social History: Social History   Socioeconomic History   Marital status: Widowed    Spouse name: Not on file   Number of children: Not on file   Years of education: Not on file   Highest education level: Not on file  Occupational History   Occupation: retired  Tobacco Use   Smoking  status: Former    Current packs/day: 0.00    Types: Cigarettes    Quit date: 1973    Years since quitting: 51.8   Smokeless tobacco: Never  Vaping Use   Vaping status: Never Used  Substance and Sexual Activity   Alcohol use: Yes    Comment: occ   Drug use: Never   Sexual activity: Not on file  Other Topics Concern   Not on file  Social History Narrative   Not on file   Social Determinants of Health   Financial Resource Strain: Low Risk  (04/08/2019)   Received from [facility] visits prior to 05/03/2022., [facility] visits prior to 05/03/2022.   Overall Financial Resource Strain (CARDIA)    Difficulty of Paying Living Expenses: Not hard at all  Food Insecurity: No Food Insecurity (01/07/2023)   Hunger Vital Sign    Worried About Running Out of Food in the Last Year: Never true    Ran Out of Food in the Last Year: Never true  Transportation Needs: No Transportation Needs (01/07/2023)   PRAPARE - Administrator, Civil Service (Medical): No    Lack of Transportation (Non-Medical): No  Physical Activity: Not on file  Stress: Not on file  Social Connections: Not on file    Allergies:  No Active Allergies  Objective:    Vital Signs:   Temp:  [95.7 F (35.4 C)-98.8 F (37.1 C)] 97.5 F (36.4 C) (11/07 0945) Pulse Rate:  [67-93] 87 (11/07 0145) Resp:  [12-31] 24 (11/07 0945) BP: (86-110)/(30-89) 86/30 (11/06 1400) SpO2:  [89 %-100 %] 89 % (11/07 0145) Arterial Line BP: (71-141)/(48-73) 99/53 (11/07 0945) FiO2 (%):  [40 %-100 %] 40 % (11/07 0443) Weight:  [69.1 kg] 69.1 kg (11/07 0500) Last BM Date :  (PTA)  Weight change: Filed Weights   01/09/2023 1641 01/07/23 0414 01/25/2023 0500  Weight: 63.5 kg 67.7 kg 69.1 kg    Intake/Output:   Intake/Output Summary (Last 24 hours) at 01/23/2023 0952 Last data filed at 01/04/2023 0900 Gross per 24 hour  Intake 3518.74 ml  Output 991.1 ml  Net 2527.64 ml      Physical  Exam    General: Intubated/ CRRT HEENT: normal Neck: supple. JVP . Carotids 2+ bilat; no bruits. No lymphadenopathy or thyromegaly appreciated. Cor: PMI nondisplaced.  Regular rate & rhythm. No rubs, gallops or murmurs. Lungs: clear Abdomen: soft, nontender, nondistended. No hepatosplenomegaly. No bruits or masses. Good bowel sounds. Extremities: no cyanosis, clubbing, rash, edema Neuro: Intubated, unresponsive.   Telemetry   SR  EKG      Labs   Basic Metabolic Panel: Recent Labs  Lab 01/24/2023 08-Feb-2115 01/22/2023 2242 01/07/23 0443 01/07/23 0637 01/07/23 1441 01/07/23 1950 01/07/23 2049 01/30/2023 0014 01/17/2023 0431 01/05/2023 0441 01/17/2023 0730  NA 133*   < > 134*   < > 131* 131* 135  --  133* 134*  --   K 6.2*   < > 7.2*   < > 7.1* 7.7* 6.0* 5.9* 6.6* 6.8* 6.2*  CL 98  --  100  --  97*  --  100  --   --  104  --   CO2  --   --  21*  --  19*  --  16*  --   --  15*  --   GLUCOSE 144*  --  130*  --  181*  --  165*  --   --  30*  --   BUN 35*  --  42*  --  47*  --  40*  --   --  27*  --   CREATININE 5.10*  --  5.61*  --  6.13*  --  4.82*  --   --  3.59*  --   CALCIUM  --    < > 8.0*  --  7.9*  --  8.1*  --   --  8.2*  --   MG  --   --  2.4  --   --   --  2.4  --   --  2.4  --   PHOS  --   --   --   --   --   --   --   --   --  4.4  --    < > = values in this interval not displayed.    Liver Function Tests: Recent Labs  Lab 01/04/2023 0441 01/10/2023 0730  AST  --  5,998*  ALT  --  3,402*  ALKPHOS  --  98  BILITOT  --  1.4*  PROT  --  4.3*  ALBUMIN 2.9* 2.3*   No results for input(s): "LIPASE", "AMYLASE" in the last 168 hours. Recent Labs  Lab 01/07/23 02-08-48  AMMONIA 46*    CBC: Recent Labs  Lab 01/07/23 1001 01/07/23 1223 01/07/23 1435 01/07/23 1950 01/31/2023 0431 01/11/2023 0721  HGB 7.5* 7.5* 6.1* 7.1* 5.8*  --   HCT 22.0* 22.0* 18.0* 21.0* 17.0*  --   PLT  --   --   --   --   --  34*    Cardiac Enzymes: No results for input(s): "CKTOTAL", "CKMB",  "CKMBINDEX", "TROPONINI" in the last 168 hours.  BNP: BNP (last 3 results) No results for input(s): "BNP" in the last 8760 hours.  ProBNP (last 3 results) No results for input(s): "PROBNP" in the last 8760 hours.   CBG: Recent Labs  Lab 01/26/2023 0436 01/17/2023 0510 01/13/2023 0722 01/20/2023 0801 01/16/2023 0928  GLUCAP 28* 124* <10* 204* 102*    Coagulation Studies: Recent Labs    01/27/2023 02-07-26 01/20/2023 0721  LABPROT 19.5* 62.2*  INR 1.6* 7.3*     Imaging   ECHOCARDIOGRAM COMPLETE  Result Date: 01/16/2023    ECHOCARDIOGRAM REPORT   Patient Name:   EVORA SCHECHTER Date of  Exam: 01/07/2023 Medical Rec #:  [id number]         Height:       67.0 in Accession #:    [id number]        Weight:       149.3 lb Date of Birth:  1945/12/14        BSA:          1.786 m Patient Age:    77 years          BP:           110/89 mmHg Patient Gender: F                 HR:           88 bpm. Exam Location:  Inpatient Procedure: 2D Echo STAT ECHO Indications:    CAD of native vessel  History:        Patient has prior history of Echocardiogram examinations, most                 recent 08/12/2022. Prior CABG, end stage renal disease, Aortic                 Valve Disease, Signs/Symptoms:Chest Pain; Risk Factors:Diabetes.  Sonographer:    Delcie Roch RDCS Referring Phys: [id number] Eugenio Hoes  Sonographer Comments: Technically difficult study due to poor echo windows and echo performed with patient supine and on artificial respirator. IMPRESSIONS  1. There is marked IVCD/pacing related dyssynchrony. There appears to be basal-mid inferolateral dyskinesis. Left ventricular ejection fraction, by estimation, is 45 to 50%. The left ventricle has mildly decreased function. The left ventricle demonstrates regional wall motion abnormalities (see scoring diagram/findings for description). There is mild concentric left ventricular hypertrophy. Indeterminate diastolic filling due to E-A fusion.  2. Right ventricular  systolic function is mildly reduced. The right ventricular size is normal.  3. Left atrial size was mild to moderately dilated.  4. The mitral valve is normal in structure. Mild mitral valve regurgitation.  5. The aortic valve was not well visualized. Aortic valve regurgitation is trivial. Mild aortic valve stenosis. Aortic valve mean gradient measures 18.0 mmHg. Aortic valve Vmax measures 3.00 m/s. Aortic valve acceleration time measures 70 msec. Comparison(s): Compared to the intraoperative TEE, the QRS complex is now broad due to new IVCD or ventricular pacing and there appears to be new inferolateral dyskinesis. FINDINGS  Left Ventricle: There is marked IVCD/pacing related dyssynchrony. There appears to be basal-mid inferolateral dyskinesis. Left ventricular ejection fraction, by estimation, is 45 to 50%. The left ventricle has mildly decreased function. The left ventricle demonstrates regional wall motion abnormalities. The left ventricular internal cavity size was normal in size. There is mild concentric left ventricular hypertrophy. Abnormal (paradoxical) septal motion, consistent with RV pacemaker. Indeterminate diastolic filling due to E-A fusion. Right Ventricle: The right ventricular size is normal. No increase in right ventricular wall thickness. Right ventricular systolic function is mildly reduced. Left Atrium: Left atrial size was mild to moderately dilated. Right Atrium: Right atrial size was normal in size. Pericardium: There is no evidence of pericardial effusion. Mitral Valve: The mitral valve is normal in structure. Mild mitral annular calcification. Mild mitral valve regurgitation, with centrally-directed jet. Tricuspid Valve: The tricuspid valve is normal in structure. Tricuspid valve regurgitation is not demonstrated. Aortic Valve: The aortic valve was not well visualized. Aortic valve regurgitation is trivial. Mild aortic stenosis is present. Aortic valve mean gradient measures 18.0 mmHg.  Aortic valve peak gradient measures  36.0 mmHg. Aortic valve area, by VTI measures 1.07 cm. Pulmonic Valve: The pulmonic valve was not well visualized. Aorta: The aortic root is normal in size and structure. IAS/Shunts: The interatrial septum was not well visualized.  LEFT VENTRICLE PLAX 2D LVIDd:         3.60 cm LVIDs:         2.70 cm LV PW:         1.20 cm LV IVS:        1.00 cm LVOT diam:     1.90 cm LV SV:         39 LV SV Index:   22 LVOT Area:     2.84 cm  LV Volumes (MOD) LV vol d, MOD A4C: 70.4 ml LV vol s, MOD A4C: 42.7 ml LV SV MOD A4C:     70.4 ml RIGHT VENTRICLE RV Basal diam:  2.60 cm TAPSE (M-mode): 1.2 cm LEFT ATRIUM           Index        RIGHT ATRIUM           Index LA diam:      2.90 cm 1.62 cm/m   RA Area:     16.10 cm LA Vol (A2C): 50.3 ml 28.17 ml/m  RA Volume:   42.80 ml  23.97 ml/m LA Vol (A4C): 67.9 ml 38.02 ml/m  AORTIC VALVE AV Area (Vmax):    1.03 cm AV Area (Vmean):   0.95 cm AV Area (VTI):     1.07 cm AV Vmax:           300.03 cm/s AV Vmean:          194.113 cm/s AV VTI:            0.362 m AV Peak Grad:      36.0 mmHg AV Mean Grad:      18.0 mmHg LVOT Vmax:         108.50 cm/s LVOT Vmean:        64.950 cm/s LVOT VTI:          0.137 m LVOT/AV VTI ratio: 0.38  AORTA Ao Root diam: 2.80 cm  SHUNTS Systemic VTI:  0.14 m Systemic Diam: 1.90 cm Thurmon Fair MD Electronically signed by Thurmon Fair MD Signature Date/Time: 01/31/2023/12:01:41 AM    Final    EEG adult  Result Date: 01/07/2023 Charlsie Quest, MD     01/07/2023  9:06 PM Patient Name: Signora Zucco MRN: [id number] Epilepsy Attending: Charlsie Quest Referring Physician/Provider: Lanier Clam, NP Date: 01/07/2023 Duration: 27.21 mins Patient history: 77yo F s/p cardiac arrest getting eeg to evaluate for seizure Level of alertness: comatose AEDs during EEG study: None Technical aspects: This EEG study was done with scalp electrodes positioned according to the 10-20 International system of electrode placement.  Electrical activity was reviewed with band pass filter of 1-70Hz , sensitivity of 7 uV/mm, display speed of 24mm/sec with a 60Hz  notched filter applied as appropriate. EEG data were recorded continuously and digitally stored.  Video monitoring was available and reviewed as appropriate. Description: EEG showed continuous generalized 3 to 6 Hz theta-delta slowing. Generalized periodic discharges with triphasic morphology at 1-2 Hz were also noted. Hyperventilation and photic stimulation were not performed.   ABNORMALITY - Periodic discharges with triphasic morphology, generalized ( GPDs) - Continuous slow, generalized IMPRESSION: This study showed generalized periodic discharges with triphasic morphology. This eeg pattern is typically due to toxic-metabolic etiology. However the frequency of  discharges is concerning for ictal-interictal nature. Recommend long term monitoring for further evaluations. Additionally the study is  suggestive of severe diffuse encephalopathy. No definite seizures were seen throughout the recording. Charlsie Quest   DG Abd Portable 1V  Result Date: 01/07/2023 CLINICAL DATA:  NG placement. EXAM: PORTABLE ABDOMEN - 1 VIEW COMPARISON:  Abdominal radiograph dated 03/06/2022. FINDINGS: Evaluation is limited due to overlying support lines. Enteric tube with tip and side-port in the left upper abdomen in the proximal stomach. IMPRESSION: Enteric tube with tip and side-port in the proximal stomach. Electronically Signed   By: Elgie Collard M.D.   On: 01/07/2023 20:11   DG CHEST PORT 1 VIEW  Result Date: 01/07/2023 CLINICAL DATA:  Intubated EXAM: PORTABLE CHEST 1 VIEW COMPARISON:  01/07/2023 FINDINGS: Single frontal view of the chest demonstrates endotracheal tube overlying tracheal air column, tip 3.7 cm above carina. The enteric catheter has been withdrawn, with tip and side port now projecting over the region of the midthoracic esophagus. Recommend removal and replacement. Left  internal jugular flow directed central venous catheter overlies main pulmonary outflow tract. Mediastinal drains and left chest tube again noted, with postsurgical changes from median sternotomy identified. Cardiac silhouette is stable, with marked ectasia and atherosclerosis of the thoracic aorta again noted. Stable areas of scarring and fibrosis, greatest in the right apex. No acute airspace disease, effusion, or pneumothorax. IMPRESSION: 1. Withdrawal of the enteric catheter, tip now projecting over the midthoracic esophagus. Recommend removal and replacement. 2. Otherwise stable support devices as above. 3. Chronic bilateral parenchymal lung scarring, without superimposed airspace disease. Electronically Signed   By: Sharlet Salina M.D.   On: 01/07/2023 15:51   CT ANGIO HEAD NECK W WO CM W PERF (CODE STROKE)  Result Date: 01/07/2023 CLINICAL DATA:  Neuro deficit, acute, stroke suspected. Unresponsive. Concern for basilar occlusion. EXAM: CT ANGIOGRAPHY HEAD AND NECK CT PERFUSION BRAIN TECHNIQUE: Multidetector CT imaging of the head and neck was performed using the standard protocol during bolus administration of intravenous contrast. Multiplanar CT image reconstructions and MIPs were obtained to evaluate the vascular anatomy. Carotid stenosis measurements (when applicable) are obtained utilizing NASCET criteria, using the distal internal carotid diameter as the denominator. Multiphase CT imaging of the brain was performed following IV bolus contrast injection. Subsequent parametric perfusion maps were calculated using RAPID software. RADIATION DOSE REDUCTION: This exam was performed according to the departmental dose-optimization program which includes automated exposure control, adjustment of the mA and/or kV according to patient size and/or use of iterative reconstruction technique. CONTRAST:  OMNIPAQUE IOHEXOL 350 MG/ML SOLN COMPARISON:  CT cervical and thoracic spine 07/04/2022 FINDINGS: CTA NECK  FINDINGS Aortic arch: Incomplete imaging of the aortic arch, including exclusion of the arch vessel origins. Apparent common origin of the brachiocephalic and left common carotid arteries, a normal variant. The included portions of the brachiocephalic and subclavian arteries are patent with mild atherosclerosis not resulting in a significant stenosis. Right carotid system: Patent with mild calcified and soft plaque scattered throughout the common carotid and proximal internal carotid arteries not resulting in significant stenosis. Smooth, segmental narrowing of the mid cervical ICA over an approximately 1.5 cm long segment with maximal stenosis of less than 50% and with slight dilatation of the more distal cervical ICA. Left carotid system: Patent with a small to moderate amount of calcified plaque about the carotid bifurcation. No significant stenosis. Mild irregularity of the mid to distal ICA, less pronounced than on the right. Vertebral arteries: Patent with  the left being dominant. Mild irregularity/beading of the distal V2 and V3 segments bilaterally. Skeleton: Minimally displaced posterior right first rib fracture, acute in appearance. Mild, chronic C7, T1, and T2 compression fractures. Other neck: Suspected hematoma in the anterior right lower neck. Upper chest: Partially visualized chest tube in the left lung apex. Biapical lung scarring/fibrosis. Partially visualized endotracheal and enteric tubes. Review of the MIP images confirms the above findings CTA HEAD FINDINGS Anterior circulation: The internal carotid arteries are patent from skull base to carotid termini with atherosclerotic siphon irregularity bilaterally but no more than mild stenosis. ACAs and MCAs are patent with mild branch vessel irregularity but no evidence of a proximal branch occlusion or flow limiting proximal stenosis. No aneurysm is identified. Posterior circulation: The intracranial vertebral arteries are patent to the basilar with  mild-to-moderate stenoses bilaterally. Patent left PICA, bilateral AICA, and bilateral SCA origins are visualized. The basilar artery is widely patent. Posterior communicating arteries are diminutive or absent. Both PCAs are patent without evidence of a significant proximal stenosis. No aneurysm is identified. Venous sinuses: As permitted by contrast timing, patent. Anatomic variants: None. Review of the MIP images confirms the above findings CT Brain Perfusion Findings: ASPECTS: 10 CBF (<30%) Volume: 0 mL Perfusion (Tmax>6.0s) volume: 0 mL Mismatch Volume: 0 mL Infarction Location: None identified by CTP The preliminary finding of no large vessel occlusion was communicated by telephone at the time of interpretation on 01/07/2023 at 1:30 pm to Dr. Selina Cooley, who verbally acknowledged these results. IMPRESSION: 1. No large vessel occlusion. 2. Intracranial atherosclerosis with mild-to-moderate vertebral artery stenoses. 3. Mild cervical carotid atherosclerosis without significant stenosis. 4. 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 5. No evidence of acute infarct or ischemic penumbra on CTP. 6. Acute right first rib fracture. Electronically Signed   By: Sebastian Ache M.D.   On: 01/07/2023 13:56   CT HEAD CODE STROKE WO CONTRAST  Result Date: 01/07/2023 CLINICAL DATA:  Code stroke. Neuro deficit, acute, stroke suspected. Unresponsive. Concern for basilar occlusion. EXAM: CT HEAD WITHOUT CONTRAST TECHNIQUE: Contiguous axial images were obtained from the base of the skull through the vertex without intravenous contrast. RADIATION DOSE REDUCTION: This exam was performed according to the departmental dose-optimization program which includes automated exposure control, adjustment of the mA and/or kV according to patient size and/or use of iterative reconstruction technique. COMPARISON:  Head CT 03/15/2021 FINDINGS: Brain: There is no evidence of an  acute infarct, intracranial hemorrhage, mass, midline shift, or extra-axial fluid collection. Patchy hypodensities in the cerebral white matter are similar to the prior study and are nonspecific but compatible with moderate chronic small vessel ischemic disease. Chronic lacunar infarcts are again seen in the right basal ganglia and right thalamus. Mild cerebral atrophy is within normal limits for age. Vascular: Calcified atherosclerosis at the skull base. Dense appearance of the proximal to mid basilar artery, potentially secondary to streak artifact as this portion of the vessel has appeared variably dense on multiple prior head CTs. This will be evaluated on the pending CTA. Skull: No acute fracture or suspicious osseous lesion. Sinuses/Orbits: Minimal mucosal thickening in the paranasal sinuses. Clear mastoid air cells. Bilateral cataract extraction. Other: None. ASPECTS [facility] Stroke Program Early CT Score) - Ganglionic level infarction (caudate, lentiform nuclei, internal capsule, insula, M1-M3 cortex): 7 - Supraganglionic infarction (M4-M6 cortex): 3 Total score (0-10 with 10 being normal): 10 IMPRESSION: 1. No evidence of acute intracranial abnormality. ASPECTS of 10. 2. Moderate chronic  small vessel ischemic disease. These results were called by telephone at the time of interpretation on 01/07/2023 at 1:30 pm to Dr. Selina Cooley, who verbally acknowledged these results. Electronically Signed   By: Sebastian Ache M.D.   On: 01/07/2023 13:31     Medications:     Current Medications:  acetaminophen  1,000 mg Oral Q6H   Or   acetaminophen (TYLENOL) oral liquid 160 mg/5 mL  1,000 mg Per Tube Q6H   aspirin EC  325 mg Oral Daily   Or   aspirin  324 mg Per Tube Daily   atorvastatin  10 mg Per Tube QPM   bisacodyl  10 mg Oral Daily   Or   bisacodyl  10 mg Rectal Daily   Chlorhexidine Gluconate Cloth  6 each Topical Daily   darbepoetin (ARANESP) injection - NON-DIALYSIS  200 mcg Subcutaneous Q Wed-1800    dextrose       docusate  100 mg Per Tube BID   ferric citrate  420 mg Oral TID WC   metoCLOPramide (REGLAN) injection  10 mg Intravenous Q6H   metoprolol tartrate  12.5 mg Oral BID   Or   metoprolol tartrate  12.5 mg Per Tube BID   mouth rinse  15 mL Mouth Rinse Q2H   pantoprazole  40 mg Oral Daily   polyethylene glycol  17 g Per Tube Daily   sertraline  25 mg Oral Daily   sodium chloride flush  3 mL Intravenous Q12H   sodium zirconium cyclosilicate  5 g Per Tube Once    Infusions:  albumin human Stopped (01/07/23 2005)   amiodarone 30 mg/hr (01/28/2023 0900)   ampicillin-sulbactam (UNASYN) IV 3 g (01/02/2023 0933)   dexmedetomidine (PRECEDEX) IV infusion Stopped (01/30/2023 0501)   dextrose 75 mL/hr at 01/12/2023 0929   epinephrine 10 mcg/min (01/10/2023 0900)   norepinephrine (LEVOPHED) Adult infusion 32 mcg/min (01/02/2023 0946)   prismasol BGK 2/2.5 dialysis solution 1,500 mL/hr at 01/04/2023 0730   sodium bicarbonate 150 mEq in sterile water 1,150 mL infusion 250 mL/hr at 01/29/2023 0902   sodium bicarbonate 150 mEq in sterile water 1,150 mL infusion 250 mL/hr at 01/28/2023 0904   vasopressin 0.04 Units/min (01/24/2023 0900)      Patient Profile  Bianca Myers is a 77 year old with a history of ESRD, DM, anemia, CAD, PAF, pulmonary hypertension,  and Jehovahs witness.   Admitted from [facility] for cath. Severe L main disease. Underwent  emergent CABG.   Assessment/Plan  1. CAD L Main disease/3V CAD S/P CABG x3. Post Cardiotomy Shock - Increased pressor  requirements over the last 24 hours.  2. Acute Respiratory Failure 11/6 Extubated but reintubated  3. Shock Liver 4. ESRD 5. Hyperkalemia  6. Hypoglycemia  7. Anemia  Hgb  5.8   Jehovah's Witness, no transfusion  8. A fib 9. Thrombocytopenia 10. GOC ongoing discussion by CCM.    Length of Stay: 2  Tonye Becket, NP  01/18/2023, 9:52 AM  Advanced Heart Failure Team Pager [phone] (M-F; 7a - 5p)  Please contact [facility] for night-coverage after hours (4p -7a ) and weekends on [email or web address]  Patient seen with NP, agree with the above note .  History as noted above.   Reintubated yesterday, had episode of nonresponsiveness prior.  Nothing acute on CT head.   Currently on epinephrine 10, NE 32, vasopressin 0.04.  CI 1.65 thermo, co-ox 63%.   ?Junctional rhythm on amiodarone gtt.   Markedly elevated LFTs, hgb 5.8,  platelets 34.  Jehovah's Witness.   Has ESRD, now on CVVH.    General: Intubated, in Trendelenberg Neck: Swan in IJ Lungs: Decreased at bases.  CV: Nondisplaced PMI.  Heart regular S1/S2, no S3/S4, no murmur.  1+ ankle edema.  No carotid bruit. Difficult to palpate pedal pulses.  Abdomen: Soft, no hepatosplenomegaly, no distention.  Skin: Intact without lesions or rashes.  Neurologic: Sedated on vent Extremities: No clubbing or cyanosis.  HEENT: Normal.   Multisystem organ failure.  Cardiogenic shock on 3 pressors with marginal MAP.  Markedly elevated LFTs.  ESRD now on CVVH with hyperkalemia.  Plts 34 and hgb 5.8, no transfusion as Jehovah's Witness. Was unresponsive prior to re-intubation (though no obvious stroke on CT head).   Discussed with CCM (Dr Tonia Brooms).  He and I do not see an effective path forwards for this patient.  Agree with consideration of comfort care.  Ongoing discussions with family, anticipate that she will likely pass away today.   Marca Ancona 01/20/2023 11:23 AM

## 2023-02-01 NOTE — Accreditation Note (Signed)
Restraint death after removal of soft bilateral wrist restraints and waist belt.  Logged on 01/23/2023 at 1335 by Robb Matar RN, BSN.

## 2023-02-01 NOTE — Progress Notes (Addendum)
SLP Cancellation Note  Patient Details Name: Bianca Myers MRN: [id number] DOB: [birth year]   Cancelled treatment:        Per chart review, pt remains intubated at this time and is not appropriate for cognitive linguistic assessment.  Spoke briefly with nursing who reports no current plans for extubation.  SLP will follow for medical readiness.  Consider swallow assessment post extubation, if indicated.   Kerrie Pleasure, MA, CCC-SLP Acute Rehabilitation Services Office: [phone] 01/20/2023, 7:48 AM

## 2023-02-01 NOTE — Progress Notes (Signed)
[address]       [place] [address]             [phone]      2 Days Post-Op  Procedure(s) (LRB): CORONARY ARTERY BYPASS GRAFTING (CABG) x3 USING LEFT INTERNAL MAMMARY ARTERY AND HARVESTED RIGHT GREATER SAPHENOUS VEIN ENDOSCOPICALLY (N/A) TRANSESOPHAGEAL ECHOCARDIOGRAM (N/A)   Total Length of Stay:  LOS: 2 days    SUBJECTIVE: Events of evening noted Had some neurologic improvement with moving all extremities then became unresponsive again. Glu low.  Continues with acidosis and large inotropic need Echo without effusion, LV function inferiolateral hypokenesia   Vitals:   01/04/2023 0615 01/13/2023 0630  BP:    Pulse:    Resp: (!) 26 (!) 24  Temp: 98.6 F (37 C) 98.4 F (36.9 C)  SpO2:      Intake/Output      11/06 0701 11/07 0700 11/07 0701 11/08 0700   I.V. (mL/kg) 1910.6 (27.7)    Blood     Other 30    IV Piggyback 1296.6    Total Intake(mL/kg) 3237.2 (46.8)    Urine (mL/kg/hr) 50 (0)    Emesis/NG output 50    Blood     Chest Tube 310    CRRT 517.5    Total Output 927.5    Net +2309.7             albumin human Stopped (01/07/23 2005)   amiodarone 30 mg/hr (01/03/2023 0600)   ampicillin-sulbactam (UNASYN) IV     calcium gluconate 2,000 mg (01/11/2023 0744)   dexmedetomidine (PRECEDEX) IV infusion Stopped (01/21/2023 0501)   dextrose 50 mL/hr at 01/17/2023 0735   epinephrine 10 mcg/min (01/24/2023 0745)   norepinephrine (LEVOPHED) Adult infusion 23 mcg/min (01/21/2023 0600)   prismasol BGK 2/2.5 dialysis solution 1,500 mL/hr at 01/26/2023 0730   prismasol BGK 2/2.5 replacement solution 400 mL/hr at 01/23/2023 0734   prismasol BGK 2/2.5 replacement solution 400 mL/hr at 01/10/2023 0738   vasopressin 0.04 Units/min (01/13/2023 0739)    CBC    Component Value Date/Time   WBC 9.0 01/18/2022 1116   RBC 3.07 (L) 01/18/2022 1116   HGB 5.8 (LL) 01/12/2023 0431   HCT 17.0 (L) 01/05/2023 0431   PLT 34 (L) 01/22/2023 0721   MCV 89.3 01/18/2022  1116   MCH 28.0 01/18/2022 1116   MCHC 31.4 01/18/2022 1116   RDW 18.1 (H) 01/18/2022 1116   LYMPHSABS 1.2 03/15/2021 0500   MONOABS 0.4 03/15/2021 0500   EOSABS 0.1 03/15/2021 0500   BASOSABS 0.1 03/15/2021 0500   CMP     Component Value Date/Time   NA 134 (L) 01/25/2023 0441   K 6.8 (HH) 01/21/2023 0441   CL 104 01/14/2023 0441   CO2 15 (L) 01/12/2023 0441   GLUCOSE 30 (LL) 01/10/2023 0441   BUN 27 (H) 01/23/2023 0441   CREATININE 3.59 (H) 01/19/2023 0441   CALCIUM 8.2 (L) 01/29/2023 0441   PROT 7.8 03/15/2021 0500   ALBUMIN 2.9 (L) 01/24/2023 0441   AST 19 03/15/2021 0500   ALT 13 03/15/2021 0500   ALKPHOS 72 03/15/2021 0500   BILITOT 3.7 (H) 03/15/2021 0500   GFRNONAA 13 (L) 01/17/2023 0441   GFRAA 8 (L) 06/15/2019 0404   ABG    Component Value Date/Time   PHART 7.316 (L) 01/03/2023 0431   PCO2ART 30.3 (L) 01/28/2023 0431   PO2ART 163 (H) 01/11/2023 0431   HCO3 15.5 (L) 01/25/2023 0431   TCO2  16 (L) 01/13/2023 0431   ACIDBASEDEF 10.0 (H) 01/15/2023 0431   O2SAT 99 01/10/2023 0431   CBG (last 3)  Recent Labs    01/18/2023 0436 01/07/2023 0510 01/16/2023 0722  GLUCAP 28* 124* <10*  EXAM Unresponsive Peripherally cool Abdomen soft bowel sounds present    ASSESSMENT: POD #2 SP emergent CABG Hemodynamics poor with need for inotropic support increasing. Will discuss with AHF if any role for impella but with overall issues not sure that would improve situation Neurologic: continues with significant impairment not sure etiology but with hypoglycemia secondary to shock liver is compounding situation. Continue to follow and increase glucose infusion Renal: on CRRT Anemia: compounded by Erline Hau witness faith. Now HCT 17%.  Poor prognosis if no improvement over next several hours with high level support    Eugenio Hoes, MD 01/10/2023

## 2023-02-01 NOTE — Procedures (Signed)
Extubation Procedure Note  Patient Details:   Name: Bianca Myers DOB: 1945/10/15 MRN: [id number]   Airway Documentation:    Vent end date: 01/15/2023 Vent end time: 1218   Evaluation  O2 sats:  comfort measures Complications: No apparent complications Patient did tolerate procedure well. Bilateral Breath Sounds: Clear   No  Patient extubated per MD order per family request.  Tolerated well.  No complications.  Elyn Peers 01/04/2023, 12:22 PM

## 2023-02-01 NOTE — Progress Notes (Addendum)
  Over the course of the morning Bianca Myers has continued to decline. Bianca Myers is in profound shock, with multisystem organ failure. Son understands that we have exhausted temporizing measures, and despite these measures Bianca Myers is in the dying process.   Decision reached to transition to comfort care We have talked about what this entails and what we might encounter regarding EOL symptoms + how these are managed.  In-hospital death expected soon after transition is made.    Tessie Fass MSN, AGACNP-BC [facility] Pulmonary/Critical Care Medicine 01/26/2023, 12:11 PM

## 2023-02-01 NOTE — Progress Notes (Signed)
LTM EEG discontinued - no skin breakdown at unhook.   

## 2023-02-01 NOTE — Progress Notes (Addendum)
NAME:  Bianca Myers, MRN:  [id number], DOB:  02-Oct-1945, LOS: 2 ADMISSION DATE:  01/23/2023, CONSULTATION DATE:  01/07/23 REFERRING MD:  Leafy Ro, CHIEF COMPLAINT:  s/p CABG    History of Present Illness:  77 yo F PMH ESRD on Hd, DM, Anemia, Jehovah's witness, s/p prior colostomy who presented to OSH ED 11/3 with CC chest pain-- pressure over the last few months but much worse the last week or so. At OSH she was found to have NSTEMI and developed hypoxic resp failure req BiPAP, and was found to have sev PAH based on OSH ECHO. She was transferred to [facility] 11/5 for further management and care. Went for cath w interventional cards and was found to have critical L main stenosis for which CVTS was consulted (not a suitable IABP candidate w calcified aorto-iliac dz)and took pt to OR for emergent CABG x  3.   She was admitted to ICU post op, intubated as per pre op plan.  PCCM is consulted 11/6 in this setting   Pertinent  Medical History  ESRD CAD DM PAH NSTEMI Jehovah witness Chronic anemia   Significant Hospital Events: Including procedures, antibiotic start and stop dates in addition to other pertinent events   11/3 to [place] w chest pain. During admission found to have NSTEMI, HFpEF, sev PAH 11/5 transferred to [facility] for cath -- critical LM stenosis. Went for emergent CABG  11/6 PCCM consult. Extubated. Nephro consult. Afib RVR then abrupt unresponsiveness. Stroke w/u unrevealing. Progressive shock, CRRT started 11/7 improved overnight for several hours then progressive decline w persistent acidosis, hyperK hypoglycemia   Interim History / Subjective:   ECHO overnight with  new inferolateral dyskinesis,  Overnight improved some-- less pressors and started moving around again, grabbing for ETT around 11pm, precedex was started.   Hours later she abruptly stopped, CBG checked and was 28. Corrected w d50   EEG from yesterday was extended to cEEG -- fq discharges could be  ictal-interictal, but could just be metabolic   Labs are bad this morning w persistent hyperK despite temporizing measures and CRRT, persistent acidosis, worse anemia. hypoCa on ABG.   She is on 6 epi 27 NE 0.04 vaso.  Cardiac indices are low   Objective   Blood pressure (!) 86/30, pulse 87, temperature 98.2 F (36.8 C), resp. rate (!) 24, height 5\' 7"  (1.702 m), weight 69.1 kg, SpO2 (!) 89%. PAP: (31-58)/(11-25) 36/20 CVP:  [13 mmHg-16 mmHg] 13 mmHg CO:  [2.9 L/min-4 L/min] 2.9 L/min CI:  [1.65 L/min/m2-2.3 L/min/m2] 1.65 L/min/m2  Vent Mode: PRVC FiO2 (%):  [40 %-100 %] 40 % Set Rate:  [24 bmp-28 bmp] 24 bmp Vt Set:  [490 mL] 490 mL PEEP:  [5 cmH20] 5 cmH20 Plateau Pressure:  [18 cmH20-23 cmH20] 23 cmH20   Intake/Output Summary (Last 24 hours) at 01/10/2023 1043 Last data filed at 01/05/2023 1000 Gross per 24 hour  Intake 3725.17 ml  Output 987.5 ml  Net 2737.67 ml   Filed Weights   01/02/2023 1641 01/07/23 0414 01/27/2023 0500  Weight: 63.5 kg 67.7 kg 69.1 kg    Examination: General: critically ill and chronically ill older adult F  HENT: Thick lacrilube ointment over eyes  Lungs: Coarse sounds, mechanically ventilated  Cardiovascular: pacer dependent. S1s2, squeaky rub Abdomen: soft, nondistended. + colostomy with low volume stool output in current bag  Extremities: no acute joint deformity.  Neuro: Unresponsive. Pupils look about 2mm but exam further clouded by thick layer of eye ointment  GU:  foley   Resolved Hospital Problem list     Assessment & Plan:   Acute encephalopathy -concern for sz vs metabolic encephalopathy -initial stroke w/u 11/6 neg, possibility of small shower still possible, or something not able to be seen on initial   -spot EEG w fq discharges, could be ictal-interictal or could just be reflective of metabolic encephalopathy -ammonia was marginally elevated at 46 yesterday evening, would not explain -hypoglycemia, acidosis, hypoperfusion all  likely contributors  P -cEEG started overnight -- if no sz, dc  -correct underlying metabolic abnormalities as able  -she remains off of all sedation at this point -too unstable to go for planned repeat CT H today   Acute respiratory failure with hypoxia Possible aspiration PNA  -cxr reviewed, RUL opacity largely unchanged  P -cont MV support. With ongoing metabolic acidosis, keeping RR as is to help compensate (pCO2 on last gas is a little low, this is less of a problem that worsening acidosis would be) -unasyn   mvCAD, critical left main stenosis s/p emergent CABG x3 Afib, not on chronic AC Hx PAH HFpEF, acutely moderately reduced EF and inferolateral hypokinesis  P -post op per CVTS -pacer dependent at this point -cards also following -right now we are fighting multisystem organ   Shock, multifactorial -- with multisystem organ failure -cardiogenic, vasoplegic 2/2 acidosis. Post-pump could play a part, but less likely given her prior pressor liberation, then subsequent mental status decline followed by hemodynamic collapse.  Doubt septic. Not hypovolemic.  P -on epi vaso NE. Goal MAP > 65. Have incr NE range to 60. Over course of morning is now epi 20 NE 42 vaso remains 0.04 -she is not an ecmo candidate.  -Prior to her ongoing decompensation this morning, CVTS consulted Adv HF to see if there is any role for impella. This was know to be a long shot, and I think that in light of her ongoing decompensation there would be no role.  -PRN bicarb pushes -- responds well transiteently to these, increases worry that underlying process driving this is ischemic in nature  -albumin  -unasyn added as above for possible aspiration preceding re-intubation  -LFTs are pending. I expect these will be elevated -no real role in checking a LA, it will be high and at this point would be more important to limit iatrogenic blood loss  Anemia of critical illness superimposed on chronic  anemia Thrombocytopenia Coagulopathy Jehovah's witness; declines blood product - albumin ok -is not having high CT output, EBL from her case was 400. So, initially some component of expected ABLA, but change we are seeing is related to her critical illness  -worry starting the shift based on her persistent acidosis, hyperK, worse shock was for some kind of thrombotic / ischemic process. I ordered a DIC panel which has since resulted -- plt 34 fibrinogen 79, ddimer>20 INR 7.3 (with PT 62.2) No schisctos -- think this is a consumptive process + related to liver failure P -degree of anemia likely further contributing to ischemia  -unfortunately there isn't much we can directly do about this, and other factor abnormalities. Aim is to tx underlying cause  -repeat DIC panel mid-day   ESRD Hyperkalemia - persistent NAGMA - persistent  Hypocalcemia  -as above, concern that the persistent acidosis and hyperK is likely driven by ischemia, above DIC panel further raises concern for thrombotic process underlying & now seeing sequelae of consumption of these factors -over course of the morning prior to note completion I have  ordered numerous amps of bicarb as well as 2g cal glu  P -CRRT per nephro, dialysate was changed and bicarb added pre and post -have used all available bicarb pushes in pyxis this morning, will start a bicarb gtt while this is restocked -iCal later this morning on ABG to see if we need add'l replacement  -is anuric, dc foley  Suspected liver failure // shock liver -DIC panel concerning for liver dysfunction (as well as thrombosis), profound hypoglycemic events indicative of worsening synthetic function P -LFTs are pending, will also have a CMP and repeat DIC in a few hours  -clinically concerned that she could have had a portal vein thrombosis or hepatic infarct. Liver doppler cannot be done while intubated, and frankly there would not be tx options if one was identified     Hypoglycemia  -especially in context of her coags, think this is indicative of liver failure.  -over course of morning I had ordered for 2amps d50 and to start a d10gtt P -incr d10 gtt based on last CBG (100 down from 200) to 75/hr -q2 CBG + PRN -add'l d50 amps + d10 titration PRN   GOC discussion DNR status -see IPAL 11/7 -- son's hope is to continue current measures to see if any temporizing measures hold -she is very unlikely to survive this process and he understands this. Since IPAL conversation, has had worse shock. Want is to try all temporizing measures (at this point all we are awaiting is for bicarb gtt to come up) and if no improvement, if labs worsen, or if shock is worsening from here, transition to comfort care.   I have discussed this case with nephrology, CVTS, cardiology, and my attending. Her prognosis is grim, we are doing truly all we can at this point but I think she will declare herself in the coming hour(s)   Best Practice (right click and "Reselect all SmartList Selections" daily)   Diet/type: NPO DVT prophylaxis: SCD GI prophylaxis: PPI Lines: Central line Foley:  removal ordered  Code Status:  DNR Last date of multidisciplinary goals of care discussion [updated son at bedside 11/7.   Labs   CBC: Recent Labs  Lab 01/07/23 1001 01/07/23 1223 01/07/23 1435 01/07/23 1950 01/15/2023 0431 01/22/2023 0721  HGB 7.5* 7.5* 6.1* 7.1* 5.8*  --   HCT 22.0* 22.0* 18.0* 21.0* 17.0*  --   PLT  --   --   --   --   --  34*    Basic Metabolic Panel: Recent Labs  Lab 01/10/2023 2116 01/28/2023 2242 01/07/23 0443 01/07/23 0637 01/07/23 1441 01/07/23 1950 01/07/23 2049 01/23/2023 0014 01/05/2023 0431 01/22/2023 0441 01/16/2023 0730  NA 133*   < > 134*   < > 131* 131* 135  --  133* 134*  --   K 6.2*   < > 7.2*   < > 7.1* 7.7* 6.0* 5.9* 6.6* 6.8* 6.2*  CL 98  --  100  --  97*  --  100  --   --  104  --   CO2  --   --  21*  --  19*  --  16*  --   --  15*  --   GLUCOSE  144*  --  130*  --  181*  --  165*  --   --  30*  --   BUN 35*  --  42*  --  47*  --  40*  --   --  27*  --  CREATININE 5.10*  --  5.61*  --  6.13*  --  4.82*  --   --  3.59*  --   CALCIUM  --   --  8.0*  --  7.9*  --  8.1*  --   --  8.2*  --   MG  --   --  2.4  --   --   --  2.4  --   --  2.4  --   PHOS  --   --   --   --   --   --   --   --   --  4.4  --    < > = values in this interval not displayed.   GFR: Estimated Creatinine Clearance: 12.8 mL/min (A) (by C-G formula based on SCr of 3.59 mg/dL (H)). No results for input(s): "PROCALCITON", "WBC", "LATICACIDVEN" in the last 168 hours.  Liver Function Tests: Recent Labs  Lab 01/28/2023 0441 01/28/2023 0730  AST  --  5,998*  ALT  --  3,402*  ALKPHOS  --  98  BILITOT  --  1.4*  PROT  --  4.3*  ALBUMIN 2.9* 2.3*   No results for input(s): "LIPASE", "AMYLASE" in the last 168 hours. Recent Labs  Lab 01/07/23 2049  AMMONIA 46*    ABG    Component Value Date/Time   PHART 7.316 (L) 01/05/2023 0431   PCO2ART 30.3 (L) 01/30/2023 0431   PO2ART 163 (H) 01/10/2023 0431   HCO3 15.5 (L) 01/22/2023 0431   TCO2 16 (L) 01/18/2023 0431   ACIDBASEDEF 10.0 (H) 01/10/2023 0431   O2SAT 62.7 01/13/2023 0721     Coagulation Profile: Recent Labs  Lab 01/27/2023 2227 01/07/2023 0721  INR 1.6* 7.3*    Cardiac Enzymes: No results for input(s): "CKTOTAL", "CKMB", "CKMBINDEX", "TROPONINI" in the last 168 hours.  HbA1C: Hgb A1c MFr Bld  Date/Time Value Ref Range Status  03/15/2021 05:00 AM 5.2 4.8 - 5.6 % Final    Comment:    (NOTE) Pre diabetes:          5.7%-6.4%  Diabetes:              >6.4%  Glycemic control for   <7.0% adults with diabetes   03/09/2019 08:48 AM 6.1 (H) 4.8 - 5.6 % Final    Comment:    (NOTE) Pre diabetes:          5.7%-6.4% Diabetes:              >6.4% Glycemic control for   <7.0% adults with diabetes     CBG: Recent Labs  Lab 01/22/2023 0510 01/13/2023 0722 01/11/2023 0801 01/22/2023 0928 01/26/2023 1026   GLUCAP 124* <10* 204* 102* 72      CRITICAL CARE Performed by: Lanier Clam   Total critical care time: 210 minutes  Critical care time was exclusive of separately billable procedures and treating other patients. Critical care was necessary to treat or prevent imminent or life-threatening deterioration.  Critical care was time spent personally by me on the following activities: development of treatment plan with patient and/or surrogate as well as nursing, discussions with consultants, evaluation of patient's response to treatment, examination of patient, obtaining history from patient or surrogate, ordering and performing treatments and interventions, ordering and review of laboratory studies, ordering and review of radiographic studies, pulse oximetry and re-evaluation of patient's condition.  Tessie Fass MSN, AGACNP-BC [facility] Pulmonary/Critical Care Medicine Amion for pager 01/24/2023, 10:43 AM

## 2023-02-01 NOTE — Progress Notes (Signed)
Patient Name: Grissel Tyrell Date of Encounter: 01/30/2023 [facility] Cardiologist: Norman Herrlich, MD   Interval Summary  .    Overnight events noted Waxing and waning neurological status. Conitnues to require high doses of pressors. Multiorgan failure  Vital Signs .    Vitals:   01/14/2023 0645 01/27/2023 0700 01/31/2023 0800 01/26/2023 0815  BP:      Pulse:      Resp: (!) 24 (!) 24 (!) 24 (!) 24  Temp: 98.4 F (36.9 C) 98.4 F (36.9 C) (!) 97.3 F (36.3 C) (!) 97.2 F (36.2 C)  TempSrc:      SpO2:      Weight:      Height:        Intake/Output Summary (Last 24 hours) at 01/26/2023 0849 Last data filed at 01/26/2023 0800 Gross per 24 hour  Intake 3353.6 ml  Output 907.5 ml  Net 2446.1 ml      01/26/2023    5:00 AM 01/07/2023    4:14 AM 01/03/2023    4:41 PM  Last 3 Weights  Weight (lbs) 152 lb 5.4 oz 149 lb 4 oz 139 lb 15.9 oz  Weight (kg) 69.1 kg 67.7 kg 63.5 kg      Telemetry/ECG    Telemetry 01/12/2023 - Personally Reviewed: No arrhythmias  Op note 01/25/2023 (Dr. Leafy Ro): CABGX3 (LIMA-LAD, rSVG-PDA, rSVG-OM)  Coronary angiogram 01/20/2023 (Dr. Herbie Baltimore): Personally reviewed and independently interpreted by me  Severe multivessel disease: Distal left main 90% stenosis involving ostial LCx 70% and small RI 70% stenoses. Proximal LAD-D1 bifurcation roughly 50% stenosis Mid OM1 80% eccentric Mid RCA at RV marginal branch focal 70%; mid PDA eccentric 80% LVEDP 24 - 29 mmHg with no clear Aortic Valve Gradient  Echocardiogram report 01/07/2023:  1. There is marked IVCD/pacing related dyssynchrony. There appears to be  basal-mid inferolateral dyskinesis. Left ventricular ejection fraction, by  estimation, is 45 to 50%. The left ventricle has mildly decreased  function. The left ventricle  demonstrates regional wall motion abnormalities (see scoring  diagram/findings for description). There is mild concentric left  ventricular hypertrophy.  Indeterminate diastolic filling due to E-A  fusion.   2. Right ventricular systolic function is mildly reduced. The right  ventricular size is normal.   3. Left atrial size was mild to moderately dilated.   4. The mitral valve is normal in structure. Mild mitral valve  regurgitation.   5. The aortic valve was not well visualized. Aortic valve regurgitation  is trivial. Mild aortic valve stenosis. Aortic valve mean gradient  measures 18.0 mmHg. Aortic valve Vmax measures 3.00 m/s. Aortic valve  acceleration time measures 70 msec.    Physical Exam .   Physical Exam Vitals and nursing note reviewed.  Constitutional:      General: She is not in acute distress. Neck:     Vascular: No JVD.  Cardiovascular:     Rate and Rhythm: Normal rate and regular rhythm.     Heart sounds: Normal heart sounds. No murmur heard.    Comments: Sternal dressing in place Pulmonary:     Effort: Pulmonary effort is normal.     Breath sounds: Normal breath sounds. No wheezing or rales.     Comments: Intubated, sedated Musculoskeletal:     Right lower leg: No edema.     Left lower leg: No edema.      Assessment & Plan .     77 y/o female, Jehovah's Witness, with ESRD on HD, CAD with  progressive angina. Critical LM and multivessel CAD (cath 11/5), now s/p CABGX3 (LIMA-LAD, rSVG-PDA, rSVG-OM) 11/5 by Dr. Leafy Ro  CAD: Presented with unstable angina. POD#2  CABGX3 (LIMA-LAD, rSVG-PDA, rSVG-OM  Multiorgan failure: Remains acidotic, now with multiorgan failure including liver, kidney, thrombocytopenia, worsening anemia.  Overall prognosis poor. Discussed with son Vaughan Basta who understands the situation but would like to continue current care.  ESRD: Now on CRRT   CRITICAL CARE Performed by: Truett Mainland   Total critical care time: 35 minutes   Critical care time was exclusive of separately billable procedures and treating other patients.   Critical care was necessary to treat or prevent  imminent or life-threatening deterioration.   Critical care was time spent personally by me on the following activities: development of treatment plan with patient and/or surrogate as well as nursing, discussions with consultants, evaluation of patient's response to treatment, examination of patient, obtaining history from patient or surrogate, ordering and performing treatments and interventions, ordering and review of laboratory studies, ordering and review of radiographic studies, pulse oximetry and re-evaluation of patient's condition.    For questions or updates, please contact [facility] Please consult [email or web address] for contact info under        Signed, Elder Negus, MD

## 2023-02-01 NOTE — Progress Notes (Signed)
Hypoglycemic Event  CBG: 28    Treatment: D50 50 mL (25 gm)  Symptoms: None  Follow-up CBG: Time:0510 CBG Result:124  Possible Reasons for Event: Inadequate meal intake  Comments/MD notified:Elink notified

## 2023-02-01 NOTE — Procedures (Addendum)
Patient Name: Caramia Boutin  MRN: [id number]  Epilepsy Attending: Charlsie Quest  Referring Physician/Provider: Charlsie Quest  Duration: 01/07/2023 2057 to 01/26/2023 1146   Patient history: 77yo F s/p cardiac arrest getting eeg to evaluate for seizure   Level of alertness: comatose   AEDs during EEG study: None   Technical aspects: This EEG study was done with scalp electrodes positioned according to the 10-20 International system of electrode placement. Electrical activity was reviewed with band pass filter of 1-70Hz , sensitivity of 7 uV/mm, display speed of 7mm/sec with a 60Hz  notched filter applied as appropriate. EEG data were recorded continuously and digitally stored.  Video monitoring was available and reviewed as appropriate.   Description: EEG showed continuous generalized 3 to 6 Hz theta-delta slowing. Generalized periodic discharges with triphasic morphology at 1.5-2 Hz were also noted. Hyperventilation and photic stimulation were not performed.      ABNORMALITY - Periodic discharges with triphasic morphology, generalized ( GPDs) - Continuous slow, generalized   IMPRESSION: This study showed generalized periodic discharges with triphasic morphology. 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 No definite seizures were seen throughout the recording.   Kabe Mckoy [name]

## 2023-02-01 NOTE — TOC Initial Note (Signed)
Transition of Care [facility]) - Initial/Assessment Note    Patient Details  Name: Bianca Myers MRN: [id number] Date of Birth: 11-11-1945  [facility] CM/SW Contact:    Nicanor Bake Phone Number: [phone] 01/17/2023, 11:54 AM  Clinical Narrative:      HF CSW attempted to meet with pt and son at bedside. CSW was advised to come back at a later time.   TOC will continue following.                    Patient Goals and CMS Choice            Expected Discharge Plan and Services                                              Prior Living Arrangements/Services                       Activities of Daily Living   ADL Screening (condition at time of admission) Independently performs ADLs?: Yes (appropriate for developmental age) Is the patient deaf or have difficulty hearing?: No Does the patient have difficulty seeing, even when wearing glasses/contacts?: No Does the patient have difficulty concentrating, remembering, or making decisions?: No  Permission Sought/Granted                  Emotional Assessment              Admission diagnosis:  Chest pain [R07.9] Hepatic encephalopathy (HCC) [K76.82] S/P CABG x 3 [Z95.1] Patient Active Problem List   Diagnosis Date Noted   Shock (HCC) 01/21/2023   Acute respiratory failure with hypoxia (HCC) 01/15/2023   Metabolic acidosis 01/05/2023   Hypocalcemia 01/17/2023   Coagulopathy (HCC) 01/23/2023   Thrombocytopenia (HCC) 01/28/2023   Acute liver failure without hepatic coma 01/02/2023   Anemia 01/11/2023   DNR (do not resuscitate) 01/13/2023   Goals of care, counseling/discussion 01/17/2023   Encephalopathy acute 01/07/2023   Endotracheally intubated 01/07/2023   ESRD (end stage renal disease) (HCC) 01/07/2023   Chest pain 01/15/2023   Hepatic encephalopathy (HCC) 01/27/2023   S/P CABG x 3 01/10/2023   Stool incontinence 03/18/2021   Psychomotor agitation  03/16/2021   Pneumonia 03/15/2021   Acute metabolic encephalopathy 03/15/2021   Dyslipidemia 03/15/2021   Fall 12/19/2019   Multiple rib fractures 12/19/2019   Hyperkalemia 12/19/2019   Thoracic ascending aortic aneurysm (HCC) 12/19/2019   Left displaced femoral neck fracture (HCC)    Syncope 06/07/2019   MSSA bacteremia 03/10/2019   Diabetes mellitus (HCC) 03/10/2019   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 [phone] Rosalita Levan, [place] - [address] AT [facility] [address] [place] [address] Phone: [phone] Fax: [phone]     Social Determinants of Health (SDOH) Social History: SDOH Screenings   Food Insecurity: No Food Insecurity (01/07/2023)  Housing: Low Risk  (01/07/2023)  Transportation Needs: No  Transportation Needs (01/07/2023)  Utilities: Not At Risk (01/07/2023)  Financial Resource Strain: Low Risk  (04/08/2019)   Received from [facility] visits prior to 05/03/2022., [facility] visits prior to 05/03/2022.  Tobacco Use: Medium Risk (01/07/2023)   SDOH Interventions:     Readmission Risk Interventions    03/20/2021    2:18 PM  Readmission Risk Prevention Plan  Transportation Screening Complete  PCP or Specialist Appt within 3-5 Days Complete  HRI or Home Care Consult Complete  Social Work Consult for Recovery Care Planning/Counseling Complete  Palliative Care Screening Not Applicable  Medication Review Oceanographer) Complete

## 2023-02-01 NOTE — Progress Notes (Signed)
Patient ID: Bianca Myers, female   DOB: 31-Jan-1946, 77 y.o.   MRN: [id number] [facility] Progress Note   Assessment/ Plan:   1.  Coronary artery disease: Status post emergent three-vessel CABG on 11/5 for management of critical stenosis of distal left main/LAD and acute diastolic heart failure/pulmonary hypertension.  Transiently extubated and then decompensated with what appears to be a catastrophic thrombotic versus embolic event. 2.  End-stage renal disease: Usually on MWF dialysis schedule; transitioned to CRRT yesterday for renal replacement therapy and management of electrolyte/metabolic abnormalities in the setting of hypotension and clinical instability.  Will continue CRRT with switch pre and post filter fluid to isotonic sodium bicarbonate. 3.  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  Will continue to monitor for ESA support. 4.  Secondary hyperparathyroidism: Will follow calcium and phosphorus levels to make decisions regarding phosphorus binder therapy +/- renal diet. 5.  Hyperkalemia: Raises suspicion for ischemic tissue injury, will modify CRRT prescription.  Subjective:   Acutely decompensated clinically after extubation early yesterday prompting reintubation, initiation of pressor support and CRRT.   Objective:   BP (!) 86/30   Pulse 87   Temp (!) 97.3 F (36.3 C)   Resp (!) 24   Ht 5\' 7"  (1.702 m)   Wt 69.1 kg   SpO2 (!) 89%   BMI 23.86 kg/m   Physical Exam: Gen: Intubated, unresponsive CVS: Pulse regular rhythm, normal rate, S1 and S2 normal Resp: Distant breath sounds bilaterally, no distinct rales or rhonchi Abd: Soft, flat, nontender, colostomy site with intact bag Ext: No lower extremity edema, left upper arm AV fistula with palpable thrill.  Right femoral dialysis catheter  Labs: BMET Recent Labs  Lab 01/04/2023 1950 01/17/2023 2026 01/15/2023 2111 01/19/2023 2116 01/07/2023 2242 01/07/23 0443  01/07/23 7829 01/07/23 1223 01/07/23 1435 01/07/23 1441 01/07/23 1950 01/07/23 2049 01/23/2023 0014 01/07/2023 0431 01/10/2023 0441  NA 129* 130*   < > 133*   < > 134*   < > 130* 131* 131* 131* 135  --  133* 134*  K 5.0 7.1*   < > 6.2*   < > 7.2*   < > 8.0* 6.7* 7.1* 7.7* 6.0* 5.9* 6.6* 6.8*  CL 96* 99  --  98  --  100  --   --   --  97*  --  100  --   --  104  CO2  --   --   --   --   --  21*  --   --   --  19*  --  16*  --   --  15*  GLUCOSE 142* 118*  --  144*  --  130*  --   --   --  181*  --  165*  --   --  30*  BUN 33* 36*  --  35*  --  42*  --   --   --  47*  --  40*  --   --  27*  CREATININE 4.80* 5.20*  --  5.10*  --  5.61*  --   --   --  6.13*  --  4.82*  --   --  3.59*  CALCIUM  --   --   --   --   --  8.0*  --   --   --  7.9*  --  8.1*  --   --  8.2*  PHOS  --   --   --   --   --   --   --   --   --   --   --   --   --   --  4.4   < > = values in this interval not displayed.   CBC Recent Labs  Lab 01/07/23 1223 01/07/23 1435 01/07/23 1950 01/15/2023 0431 01/02/2023 0721  HGB 7.5* 6.1* 7.1* 5.8*  --   HCT 22.0* 18.0* 21.0* 17.0*  --   PLT  --   --   --   --  34*      Medications:      stroke: early stages of recovery book   Does not apply Once   acetaminophen  1,000 mg Oral Q6H   Or   acetaminophen (TYLENOL) oral liquid 160 mg/5 mL  1,000 mg Per Tube Q6H   aspirin EC  325 mg Oral Daily   Or   aspirin  324 mg Per Tube Daily   atorvastatin  10 mg Per Tube QPM   bisacodyl  10 mg Oral Daily   Or   bisacodyl  10 mg Rectal Daily   Chlorhexidine Gluconate Cloth  6 each Topical Daily   darbepoetin (ARANESP) injection - NON-DIALYSIS  200 mcg Subcutaneous Q Wed-1800   dextrose       docusate  100 mg Per Tube BID   ferric citrate  420 mg Oral TID WC   metoCLOPramide (REGLAN) injection  10 mg Intravenous Q6H   metoprolol tartrate  12.5 mg Oral BID   Or   metoprolol tartrate  12.5 mg Per Tube BID   mouth rinse  15 mL Mouth Rinse Q2H   pantoprazole  40 mg Oral Daily    polyethylene glycol  17 g Per Tube Daily   sertraline  25 mg Oral Daily   sodium bicarbonate       sodium chloride flush  3 mL Intravenous Q12H   sodium zirconium cyclosilicate  5 g Per Tube Once   Zetta Bills, MD 01/19/2023, 8:18 AM

## 2023-02-01 NOTE — Progress Notes (Signed)
OT Cancellation Note  Patient Details Name: Bianca Myers MRN: [id number] DOB: 1945/06/21   Cancelled Treatment:    Reason Eval/Treat Not Completed: Medical issues which prohibited therapy.  OT will check back to assess appropriateness.    Bianca Myers 01/31/2023, 10:01 AM 01/21/2023  RP, OTR/L  Acute Rehabilitation Services  Office:  [phone]

## 2023-02-01 NOTE — Death Summary Note (Signed)
DEATH SUMMARY   Patient Details  Name: Bianca Myers MRN: [id number] DOB: [birth year]  Admission/Discharge Information   Admit Date:  07-Jan-2023  Date of Death: Date of Death: 01/09/23  Time of Death: Time of Death: 01-12-30  Length of Stay: 2  Referring Physician: Everlean Cherry, MD   Reason(s) for Hospitalization  Chest pain and shortness of breath with elevated troponin diagnosed with NSTEMI. 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   Diagnoses  Preliminary cause of death: post cardiotomy shock with multisystem organ failure  Secondary Diagnoses (including complications and co-morbidities):  Principal Problem:   Chest pain Active Problems:   Hepatic encephalopathy (HCC)   S/P CABG x 3   Encephalopathy acute   Endotracheally intubated   ESRD (end stage renal disease) (HCC)   Shock (HCC)   Acute respiratory failure with hypoxia (HCC)   Metabolic acidosis   Hypocalcemia   Coagulopathy (HCC)   Thrombocytopenia (HCC)   Acute liver failure without hepatic coma   Anemia   DNR (do not resuscitate)   Goals of care, counseling/discussion   Brief Hospital Course (including significant findings, care, treatment, and services provided and events leading to death)  Deloyce Myers is a 77 y.o. year old female who  History of Present Illness:     Bianca Myers is a 77 yo female who started having chest pressure the past few months. This worsened considerably the past 10 days and is associated with SOB. Pt was admitted to [facility] and was felt to be cardiac in nature with elevated troponins. PT had echo with normal LV function with a mean gradient across aortic valve of . Underwent dialysis Sunday and 01/13/2023 and was transferred to [facility] Cath lab where severe LM stenosis and 3V CAD was noted. Pt was felt due to pain recently and critical nature of LM stenosis to require emergency bypass. Pt was felt to have calcified aorto iliac  disease to make IABP not safe. Pt is currently pain free. Reportedly had no gradient across AV during cath. 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 She has HTN, GERD and is a Soil scientist witness.   Dr. Leafy Ro reviewed the patient's diagnostic studies and determined she would benefit from surgical intervention. He reviewed the treatment options as well as the risks and benefits with the patient. She was agreeable to proceed with surgery.  Hospital Course: Ms. Doten was emergently brought to the operating room on 2023/01/07. She underwent CABGx3 utilizing LIMA to LAD, SVG to OM, and SVG to PDA as well as endoscopic harvest of the right greater saphenous vein. She tolerated the procedure and was transferred to the SICU in stable condition. POD1 she would nod to voices but was not able to follow commands, she remained intubated until later that morning. She requiring Vasopressin 0.04units/min and Norepinephrine 41mcg/min for blood pressure support. Nephrology was consulted for assistance with ESRD requiring dialysis. She was extubated on POD1 without complication. She developed encephalopathy postoperatively likely due to sedation, the son states she has a history of encephalopathy postoperatively. She developed postoperative paroxysmal atrial fibrillation with RVR and became unresponsive, code stroke was called and she was re-intubated. Neurology was consulted, CT head and CTA head/neck showed no acute ischemia, hemorrhage or intracranial abnormality. MRI was unable to be performed due to EPW in place. 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 HD catheter was placed and she was started on CRRT  The patient became more acidotic and hyperkalemic despite all efforts. She was hypotensive requiring high doses of pressors including 55.5 of Epi, 24 of Vaso, and 54.3 of Norepi as well as IV Amiodarone for atrial fibrillation. Echocardiogram showed LV hypokinesia.  She developed hypoglycemia secondary to shock liver. She had expected postoperative acute blood loss anemia with H/H 5.8/17 but the patient is a Air traffic controller witness and wished to not receive any blood products other than albumin. The patient overall developed multiorgan failure including liver, kidney, thrombocytopenia, and worsening anemia. She was not a candidate for further mechanical support. Critical care discussed the patient's poor outlook with the family due to the lack of improvement with aggressive support measures. The family transitioned the patient to DNR status and would like the patient to be transferred to comfort care if her condition worsens and it becomes clear that it is not reversible. She continued to decline and developed profuse shock with multisystem organ failure despite all measures. The son understood that despite all efforts she was in the dying process and he reached the decision to transition to comfort care. She was extubated, transitioned to comfort care and declared dead soon after, at 12:31PM on 11/07.      Pertinent Labs and Studies  Significant Diagnostic Studies DG Chest Port 1 View  Result Date: 01/23/2023 CLINICAL DATA:  Postoperative status. EXAM: PORTABLE CHEST 1 VIEW COMPARISON:  January 07, 2023. FINDINGS: Stable cardiomediastinal silhouette. Endotracheal and nasogastric tubes are unchanged. Swan-Ganz catheter is unchanged. Left-sided chest tube is noted without pneumothorax. Stable right upper lobe opacity is noted. Bony thorax is unremarkable. IMPRESSION: Stable support apparatus. Stable left-sided chest tube without pneumothorax. Stable right upper lobe opacity. Electronically Signed   By: Lupita Raider M.D.   On: 01/10/2023 10:36   Overnight EEG with video  Result Date: 01/09/2023 Charlsie Quest, MD     01/26/2023 12:18 PM Patient Name: Bianca Myers MRN: [id number] Epilepsy Attending: Charlsie Quest Referring Physician/Provider: Charlsie Quest  Duration: 01/07/2023 2057 to 01/10/2023 1146  Patient history: 77yo F s/p cardiac arrest getting eeg to evaluate for seizure  Level of alertness: comatose  AEDs during EEG study: None  Technical aspects: This EEG study was done with scalp electrodes positioned according to the 10-20 International system of electrode placement. Electrical activity was reviewed with band pass filter of 1-70Hz , sensitivity of 7 uV/mm, display speed of 2mm/sec with a 60Hz  notched filter applied as appropriate. EEG data were recorded continuously and digitally stored.  Video monitoring was available and reviewed as appropriate.  Description: EEG showed continuous generalized 3 to 6 Hz theta-delta slowing. Generalized periodic discharges with triphasic morphology at 1.5-2 Hz were also noted. Hyperventilation and photic stimulation were not performed.    ABNORMALITY - Periodic discharges with triphasic morphology, generalized ( GPDs) - Continuous slow, generalized  IMPRESSION: This study showed generalized periodic discharges with triphasic morphology. In the setting of cardiac arrest, this eeg pattern is most likely secondary to anoxic-hypoxic brain injury  Additionally the study was suggestive of severe diffuse encephalopathy. No definite seizures were seen throughout the recording.  Charlsie Quest   ECHOCARDIOGRAM COMPLETE  Result Date: 01/24/2023    ECHOCARDIOGRAM REPORT   Patient Name:   ANASTASHA ORTEZ Date of Exam: 01/07/2023 Medical Rec #:  [id number]         Height:       67.0 in Accession #:    [id number]        Weight:  149.3 lb Date of Birth:  06/20/1945        BSA:          1.786 m Patient Age:    78 years          BP:           110/89 mmHg Patient Gender: F                 HR:           88 bpm. Exam Location:  Inpatient Procedure: 2D Echo STAT ECHO Indications:    CAD of native vessel  History:        Patient has prior history of Echocardiogram examinations, most                 recent 08/12/2022. Prior CABG, end  stage renal disease, Aortic                 Valve Disease, Signs/Symptoms:Chest Pain; Risk Factors:Diabetes.  Sonographer:    Delcie Roch RDCS Referring Phys: [id number] Eugenio Hoes  Sonographer Comments: Technically difficult study due to poor echo windows and echo performed with patient supine and on artificial respirator. IMPRESSIONS  1. There is marked IVCD/pacing related dyssynchrony. There appears to be basal-mid inferolateral dyskinesis. Left ventricular ejection fraction, by estimation, is 45 to 50%. The left ventricle has mildly decreased function. The left ventricle demonstrates regional wall motion abnormalities (see scoring diagram/findings for description). There is mild concentric left ventricular hypertrophy. Indeterminate diastolic filling due to E-A fusion.  2. Right ventricular systolic function is mildly reduced. The right ventricular size is normal.  3. Left atrial size was mild to moderately dilated.  4. The mitral valve is normal in structure. Mild mitral valve regurgitation.  5. The aortic valve was not well visualized. Aortic valve regurgitation is trivial. Mild aortic valve stenosis. Aortic valve mean gradient measures 18.0 mmHg. Aortic valve Vmax measures 3.00 m/s. Aortic valve acceleration time measures 70 msec. Comparison(s): Compared to the intraoperative TEE, the QRS complex is now broad due to new IVCD or ventricular pacing and there appears to be new inferolateral dyskinesis. FINDINGS  Left Ventricle: There is marked IVCD/pacing related dyssynchrony. There appears to be basal-mid inferolateral dyskinesis. Left ventricular ejection fraction, by estimation, is 45 to 50%. The left ventricle has mildly decreased function. The left ventricle demonstrates regional wall motion abnormalities. The left ventricular internal cavity size was normal in size. There is mild concentric left ventricular hypertrophy. Abnormal (paradoxical) septal motion, consistent with RV pacemaker. Indeterminate  diastolic filling due to E-A fusion. Right Ventricle: The right ventricular size is normal. No increase in right ventricular wall thickness. Right ventricular systolic function is mildly reduced. Left Atrium: Left atrial size was mild to moderately dilated. Right Atrium: Right atrial size was normal in size. Pericardium: There is no evidence of pericardial effusion. Mitral Valve: The mitral valve is normal in structure. Mild mitral annular calcification. Mild mitral valve regurgitation, with centrally-directed jet. Tricuspid Valve: The tricuspid valve is normal in structure. Tricuspid valve regurgitation is not demonstrated. Aortic Valve: The aortic valve was not well visualized. Aortic valve regurgitation is trivial. Mild aortic stenosis is present. Aortic valve mean gradient measures 18.0 mmHg. Aortic valve peak gradient measures 36.0 mmHg. Aortic valve area, by VTI measures 1.07 cm. Pulmonic Valve: The pulmonic valve was not well visualized. Aorta: The aortic root is normal in size and structure. IAS/Shunts: The interatrial septum was not well visualized.  LEFT VENTRICLE PLAX 2D LVIDd:  3.60 cm LVIDs:         2.70 cm LV PW:         1.20 cm LV IVS:        1.00 cm LVOT diam:     1.90 cm LV SV:         39 LV SV Index:   22 LVOT Area:     2.84 cm  LV Volumes (MOD) LV vol d, MOD A4C: 70.4 ml LV vol s, MOD A4C: 42.7 ml LV SV MOD A4C:     70.4 ml RIGHT VENTRICLE RV Basal diam:  2.60 cm TAPSE (M-mode): 1.2 cm LEFT ATRIUM           Index        RIGHT ATRIUM           Index LA diam:      2.90 cm 1.62 cm/m   RA Area:     16.10 cm LA Vol (A2C): 50.3 ml 28.17 ml/m  RA Volume:   42.80 ml  23.97 ml/m LA Vol (A4C): 67.9 ml 38.02 ml/m  AORTIC VALVE AV Area (Vmax):    1.03 cm AV Area (Vmean):   0.95 cm AV Area (VTI):     1.07 cm AV Vmax:           300.03 cm/s AV Vmean:          194.113 cm/s AV VTI:            0.362 m AV Peak Grad:      36.0 mmHg AV Mean Grad:      18.0 mmHg LVOT Vmax:         108.50 cm/s LVOT  Vmean:        64.950 cm/s LVOT VTI:          0.137 m LVOT/AV VTI ratio: 0.38  AORTA Ao Root diam: 2.80 cm  SHUNTS Systemic VTI:  0.14 m Systemic Diam: 1.90 cm Thurmon Fair MD Electronically signed by Thurmon Fair MD Signature Date/Time: 01/11/2023/12:01:41 AM    Final    EEG adult  Result Date: 01/07/2023 Charlsie Quest, MD     01/07/2023  9:06 PM Patient Name: Biridiana Twardowski MRN: [id number] Epilepsy Attending: Charlsie Quest Referring Physician/Provider: Lanier Clam, NP Date: 01/07/2023 Duration: 27.21 mins Patient history: 77yo F s/p cardiac arrest getting eeg to evaluate for seizure Level of alertness: comatose AEDs during EEG study: None Technical aspects: This EEG study was done with scalp electrodes positioned according to the 10-20 International system of electrode placement. Electrical activity was reviewed with band pass filter of 1-70Hz , sensitivity of 7 uV/mm, display speed of 17mm/sec with a 60Hz  notched filter applied as appropriate. EEG data were recorded continuously and digitally stored.  Video monitoring was available and reviewed as appropriate. Description: EEG showed continuous generalized 3 to 6 Hz theta-delta slowing. Generalized periodic discharges with triphasic morphology at 1-2 Hz were also noted. Hyperventilation and photic stimulation were not performed.   ABNORMALITY - Periodic discharges with triphasic morphology, generalized ( GPDs) - Continuous slow, generalized IMPRESSION: This study showed generalized periodic discharges with triphasic morphology. This eeg pattern is typically due to toxic-metabolic etiology. However the frequency of discharges is concerning for ictal-interictal nature. Recommend long term monitoring for further evaluations. Additionally the study is  suggestive of severe diffuse encephalopathy. No definite seizures were seen throughout the recording. Charlsie Quest   DG Abd Portable 1V  Result Date: 01/07/2023 CLINICAL DATA:  NG placement.  EXAM:  PORTABLE ABDOMEN - 1 VIEW COMPARISON:  Abdominal radiograph dated 03/06/2022. FINDINGS: Evaluation is limited due to overlying support lines. Enteric tube with tip and side-port in the left upper abdomen in the proximal stomach. IMPRESSION: Enteric tube with tip and side-port in the proximal stomach. Electronically Signed   By: Elgie Collard M.D.   On: 01/07/2023 20:11   DG CHEST PORT 1 VIEW  Result Date: 01/07/2023 CLINICAL DATA:  Intubated EXAM: PORTABLE CHEST 1 VIEW COMPARISON:  01/07/2023 FINDINGS: Single frontal view of the chest demonstrates endotracheal tube overlying tracheal air column, tip 3.7 cm above carina. The enteric catheter has been withdrawn, with tip and side port now projecting over the region of the midthoracic esophagus. Recommend removal and replacement. Left internal jugular flow directed central venous catheter overlies main pulmonary outflow tract. Mediastinal drains and left chest tube again noted, with postsurgical changes from median sternotomy identified. Cardiac silhouette is stable, with marked ectasia and atherosclerosis of the thoracic aorta again noted. Stable areas of scarring and fibrosis, greatest in the right apex. No acute airspace disease, effusion, or pneumothorax. IMPRESSION: 1. Withdrawal of the enteric catheter, tip now projecting over the midthoracic esophagus. Recommend removal and replacement. 2. Otherwise stable support devices as above. 3. Chronic bilateral parenchymal lung scarring, without superimposed airspace disease. Electronically Signed   By: Sharlet Salina M.D.   On: 01/07/2023 15:51   CT ANGIO HEAD NECK W WO CM W PERF (CODE STROKE)  Result Date: 01/07/2023 CLINICAL DATA:  Neuro deficit, acute, stroke suspected. Unresponsive. Concern for basilar occlusion. EXAM: CT ANGIOGRAPHY HEAD AND NECK CT PERFUSION BRAIN TECHNIQUE: Multidetector CT imaging of the head and neck was performed using the standard protocol during bolus administration of  intravenous contrast. Multiplanar CT image reconstructions and MIPs were obtained to evaluate the vascular anatomy. Carotid stenosis measurements (when applicable) are obtained utilizing NASCET criteria, using the distal internal carotid diameter as the denominator. Multiphase CT imaging of the brain was performed following IV bolus contrast injection. Subsequent parametric perfusion maps were calculated using RAPID software. RADIATION DOSE REDUCTION: This exam was performed according to the departmental dose-optimization program which includes automated exposure control, adjustment of the mA and/or kV according to patient size and/or use of iterative reconstruction technique. CONTRAST:  OMNIPAQUE IOHEXOL 350 MG/ML SOLN COMPARISON:  CT cervical and thoracic spine 07/04/2022 FINDINGS: CTA NECK FINDINGS Aortic arch: Incomplete imaging of the aortic arch, including exclusion of the arch vessel origins. Apparent common origin of the brachiocephalic and left common carotid arteries, a normal variant. The included portions of the brachiocephalic and subclavian arteries are patent with mild atherosclerosis not resulting in a significant stenosis. Right carotid system: Patent with mild calcified and soft plaque scattered throughout the common carotid and proximal internal carotid arteries not resulting in significant stenosis. Smooth, segmental narrowing of the mid cervical ICA over an approximately 1.5 cm long segment with maximal stenosis of less than 50% and with slight dilatation of the more distal cervical ICA. Left carotid system: Patent with a small to moderate amount of calcified plaque about the carotid bifurcation. No significant stenosis. Mild irregularity of the mid to distal ICA, less pronounced than on the right. Vertebral arteries: Patent with the left being dominant. Mild irregularity/beading of the distal V2 and V3 segments bilaterally. Skeleton: Minimally displaced posterior right first rib fracture,  acute in appearance. Mild, chronic C7, T1, and T2 compression fractures. Other neck: Suspected hematoma in the anterior right lower neck. Upper chest: Partially visualized chest tube in  the left lung apex. Biapical lung scarring/fibrosis. Partially visualized endotracheal and enteric tubes. Review of the MIP images confirms the above findings CTA HEAD FINDINGS Anterior circulation: The internal carotid arteries are patent from skull base to carotid termini with atherosclerotic siphon irregularity bilaterally but no more than mild stenosis. ACAs and MCAs are patent with mild branch vessel irregularity but no evidence of a proximal branch occlusion or flow limiting proximal stenosis. No aneurysm is identified. Posterior circulation: The intracranial vertebral arteries are patent to the basilar with mild-to-moderate stenoses bilaterally. Patent left PICA, bilateral AICA, and bilateral SCA origins are visualized. The basilar artery is widely patent. Posterior communicating arteries are diminutive or absent. Both PCAs are patent without evidence of a significant proximal stenosis. No aneurysm is identified. Venous sinuses: As permitted by contrast timing, patent. Anatomic variants: None. Review of the MIP images confirms the above findings CT Brain Perfusion Findings: ASPECTS: 10 CBF (<30%) Volume: 0 mL Perfusion (Tmax>6.0s) volume: 0 mL Mismatch Volume: 0 mL Infarction Location: None identified by CTP The preliminary finding of no large vessel occlusion was communicated by telephone at the time of interpretation on 01/07/2023 at 1:30 pm to Dr. Selina Cooley, who verbally acknowledged these results. IMPRESSION: 1. No large vessel occlusion. 2. Intracranial atherosclerosis with mild-to-moderate vertebral artery stenoses. 3. Mild cervical carotid atherosclerosis without significant stenosis. 4. 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 5. No evidence of acute  infarct or ischemic penumbra on CTP. 6. Acute right first rib fracture. Electronically Signed   By: Sebastian Ache M.D.   On: 01/07/2023 13:56   CT HEAD CODE STROKE WO CONTRAST  Result Date: 01/07/2023 CLINICAL DATA:  Code stroke. Neuro deficit, acute, stroke suspected. Unresponsive. Concern for basilar occlusion. EXAM: CT HEAD WITHOUT CONTRAST TECHNIQUE: Contiguous axial images were obtained from the base of the skull through the vertex without intravenous contrast. RADIATION DOSE REDUCTION: This exam was performed according to the departmental dose-optimization program which includes automated exposure control, adjustment of the mA and/or kV according to patient size and/or use of iterative reconstruction technique. COMPARISON:  Head CT 03/15/2021 FINDINGS: Brain: There is no evidence of an acute infarct, intracranial hemorrhage, mass, midline shift, or extra-axial fluid collection. Patchy hypodensities in the cerebral white matter are similar to the prior study and are nonspecific but compatible with moderate chronic small vessel ischemic disease. Chronic lacunar infarcts are again seen in the right basal ganglia and right thalamus. Mild cerebral atrophy is within normal limits for age. Vascular: Calcified atherosclerosis at the skull base. Dense appearance of the proximal to mid basilar artery, potentially secondary to streak artifact as this portion of the vessel has appeared variably dense on multiple prior head CTs. This will be evaluated on the pending CTA. Skull: No acute fracture or suspicious osseous lesion. Sinuses/Orbits: Minimal mucosal thickening in the paranasal sinuses. Clear mastoid air cells. Bilateral cataract extraction. Other: None. ASPECTS [facility] Stroke Program Early CT Score) - Ganglionic level infarction (caudate, lentiform nuclei, internal capsule, insula, M1-M3 cortex): 7 - Supraganglionic infarction (M4-M6 cortex): 3 Total score (0-10 with 10 being normal): 10 IMPRESSION: 1. No  evidence of acute intracranial abnormality. ASPECTS of 10. 2. Moderate chronic small vessel ischemic disease. These results were called by telephone at the time of interpretation on 01/07/2023 at 1:30 pm to Dr. Selina Cooley, who verbally acknowledged these results. Electronically Signed   By: Sebastian Ache M.D.   On: 01/07/2023 13:31   DG Chest [facility]  Result Date: 01/07/2023 CLINICAL DATA:  Status post CABG EXAM: PORTABLE CHEST 1 VIEW COMPARISON:  01/22/2023 FINDINGS: Unchanged, rotated AP portable chest radiograph. Support apparatus includes endotracheal tube, esophagogastric tube, left neck pulmonary vascular catheter, and mediastinal and left chest tubes in unchanged, appropriate position. Cardiomegaly status post median sternotomy and CABG. Unchanged fibrotic scarring and volume loss of the right apex. No acute airspace opacity. No acute osseous findings. IMPRESSION: 1. Unchanged, rotated AP portable chest radiograph. Support apparatus includes endotracheal tube, esophagogastric tube, left neck pulmonary vascular catheter, and mediastinal and left chest tubes in unchanged, appropriate position. 2. Cardiomegaly status post median sternotomy and CABG. 3. Unchanged fibrotic scarring and volume loss of the right apex. No acute airspace opacity. Electronically Signed   By: Jearld Lesch M.D.   On: 01/07/2023 10:19   DG Chest Port 1 View  Result Date: 01/22/2023 CLINICAL DATA:  Coronary artery bypass grafting EXAM: PORTABLE CHEST 1 VIEW COMPARISON:  05/04/2022 FINDINGS: Endotracheal tube is seen 2.8 cm above the carina. Nasogastric tube extends into the upper abdomen beyond the margin examination. Left internal jugular Swan-Ganz catheter tip is seen within the expected pulmonary arterial bifurcation. Biapical pulmonary scarring, more prevalent within the right upper lobe with associated volume loss, is stable since prior CT examination. Mediastinal drain and left chest tube are in place. No pneumothorax or  pleural effusion. Coronary artery bypass grafting has been performed. Cardiac size is within normal limits. Pulmonary vascularity is normal. No acute bone abnormality. IMPRESSION: 1. Support tubes in appropriate position. Left chest tube in place. No pneumothorax. 2. Stable biapical pulmonary scarring, more prevalent within the right upper lobe with associated volume loss. Electronically Signed   By: Helyn Numbers M.D.   On: 01/30/2023 23:26   ECHO INTRAOPERATIVE TEE  Result Date: 01/30/2023  *INTRAOPERATIVE TRANSESOPHAGEAL REPORT *  Patient Name:   ATLANTA PELTO Date of Exam: 01/28/2023 Medical Rec #:  [id number]         Height:       67.0 in Accession #:    [id number]        Weight:       140.0 lb Date of Birth:  07/18/1945        BSA:          1.74 m Patient Age:    77 years          BP:           138/53 mmHg Patient Gender: F                 HR:           69 bpm. Exam Location:  Anesthesiology Transesophogeal exam was perform intraoperatively during surgical procedure. Patient was closely monitored under general anesthesia during the entirety of examination. Indications:     R07.9* Chest pain, unspecified Performing Phys: [id number] Eugenio Hoes Complications: No known complications during this procedure. POST-OP IMPRESSIONS _ Left Ventricle: has normal systolic function, with an ejection fraction of 60%. The cavity size was normal. The wall motion is normal. _ Right Ventricle: The right ventricle appears unchanged from pre-bypass normal function. _ Aorta: The aorta appears unchanged from pre-bypass. _ Left Atrial Appendage: The left atrial appendage appears unchanged from pre-bypass. _ Aortic Valve: The aortic valve appears unchanged from pre-bypass. There is mild regurgitation. _ Mitral Valve: There is mild regurgitation. _ Tricuspid Valve: There is mild regurgitation. _ Interatrial Septum: The interatrial septum appears unchanged from pre-bypass. _ Pericardium: The pericardium appears unchanged from  pre-bypass. _ Comments: Post-bypass images reviewed with surgeon. PRE-OP FINDINGS  Left Ventricle: The left ventricle has normal systolic function, with an ejection fraction of 55-60%. The cavity size was normal. There is mild concentric left ventricular hypertrophy. There is mild anterior wall hypokinesis. Right Ventricle: The right ventricle has normal systolic function. The cavity was normal. There is no increase in right ventricular wall thickness. Left Atrium: Left atrial size was dilated. No left atrial/left atrial appendage thrombus was detected. Right Atrium: Right atrial size was normal in size. Right atrial pressure is estimated at 15 mmHg. Interatrial Septum: No atrial level shunt detected by color flow Doppler. Pericardium: There is no evidence of pericardial effusion. Mitral Valve: The mitral valve is normal in structure. Mitral valve regurgitation mild-moderate. The MR jet is centrally-directed. Tricuspid Valve: The tricuspid valve was normal in structure. Tricuspid valve regurgitation is mild by color flow Doppler. Aortic Valve: The aortic valve is tricuspid Aortic valve regurgitation is mild by color flow Doppler. There is mild stenosis of the aortic valve. Mildly restricted left coronary cusp. Pulmonic Valve: The pulmonic valve was normal in structure. Pulmonic valve regurgitation is trivial by color flow Doppler. Aorta: The aortic root and ascending aorta are normal in size and structure. There is evidence of plaque in the descending aorta. +---------------+------+-------+ RIGHT VENTRICLE              +---------------+------+-------+ TAPSE (M-mode):3.3 cm2.37 cm +---------------+------+-------+ +------------------+------------++ AORTIC VALVE                   +------------------+------------++ AV Vmax:          252.00 cm/s  +------------------+------------++ AV Vmean:         159.000 cm/s +------------------+------------++ AV VTI:           0.602 m       +------------------+------------++ AV Peak Grad:     25.4 mmHg    +------------------+------------++ AV Mean Grad:     12.5 mmHg    +------------------+------------++ LVOT Vmax:        84.60 cm/s   +------------------+------------++ LVOT Vmean:       59.100 cm/s  +------------------+------------++ LVOT VTI:         0.182 m      +------------------+------------++ LVOT/AV VTI ratio:0.30         +------------------+------------++ +---------------+-----------++ TRICUSPID VALVE            +---------------+-----------++ TR Peak grad:  54.2 mmHg   +---------------+-----------++ TR Vmax:       368.00 cm/s +---------------+-----------++  +-------------+------+ SHUNTS              +-------------+------+ Systemic VTI:0.18 m +-------------+------+  Arrie Aran MD Electronically signed by Arrie Aran MD Signature Date/Time: 01/26/2023/10:29:00 PM    Final    EP STUDY  Result Date: 01/12/2023 See surgical note for result.  CARDIAC CATHETERIZATION  Result Date: 01/05/2023   Mid LM to Ost LAD lesion is 95% stenosed with 65% stenosed side branch in Ost Cx to Prox Cx.   1st Mrg lesion is 80% stenosed.   Ramus lesion is 70% stenosed.   Prox LAD lesion is 50% stenosed with 50% stenosed side branch in 1st Diag.   Prox RCA to Mid RCA lesion is 70% stenosed.   RPDA lesion is 80% stenosed.   There is mild aortic valve stenosis. 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 Proximal LAD-D1 bifurcation roughly 50% stenosis Mid OM1 80%  eccentric Mid RCA at RV marginal branch focal 70%; mid PDA eccentric 80% LVEDP 24 - 29 mmHg with no clear Aortic Valve Gradient RECOMMENDATIONS   Patient will be taken directly to the Cath Lab to the OR for emergent CABG   Recommend Aspirin 81mg  daily for moderate CAD.   Would strongly consider Plavix post CABG prior to discharge Bryan Lemma, MD   Microbiology Recent  Results (from the past 240 hour(s))  MRSA Next Gen by PCR, Nasal     Status: None   Collection Time: 01/09/2023 11:36 PM   Specimen: Nasal Mucosa; Nasal Swab  Result Value Ref Range Status   MRSA by PCR Next Gen NOT DETECTED NOT DETECTED Final    Comment: (NOTE) The GeneXpert MRSA Assay (FDA approved for NASAL specimens only), is one component of a comprehensive MRSA colonization surveillance program. It is not intended to diagnose MRSA infection nor to guide or monitor treatment for MRSA infections. Test performance is not FDA approved in patients less than 16 years old. Performed at [facility], [address] [place], [place] [address]     Lab Basic Metabolic Panel: Recent Labs  Lab 01/03/2023 2116 01/27/2023 2242 01/07/23 0443 01/07/23 0637 01/07/23 1441 01/07/23 1950 01/07/23 2049 01/28/2023 0014 01/18/2023 0431 01/10/2023 0441 01/23/2023 0730  NA 133*   < > 134*   < > 131* 131* 135  --  133* 134*  --   K 6.2*   < > 7.2*   < > 7.1* 7.7* 6.0* 5.9* 6.6* 6.8* 6.2*  CL 98  --  100  --  97*  --  100  --   --  104  --   CO2  --   --  21*  --  19*  --  16*  --   --  15*  --   GLUCOSE 144*  --  130*  --  181*  --  165*  --   --  30*  --   BUN 35*  --  42*  --  47*  --  40*  --   --  27*  --   CREATININE 5.10*  --  5.61*  --  6.13*  --  4.82*  --   --  3.59*  --   CALCIUM  --   --  8.0*  --  7.9*  --  8.1*  --   --  8.2*  --   MG  --   --  2.4  --   --   --  2.4  --   --  2.4  --   PHOS  --   --   --   --   --   --   --   --   --  4.4  --    < > = values in this interval not displayed.   Liver Function Tests: Recent Labs  Lab 01/05/2023 0441 01/18/2023 0730  AST  --  5,998*  ALT  --  3,402*  ALKPHOS  --  98  BILITOT  --  1.4*  PROT  --  4.3*  ALBUMIN 2.9* 2.3*   No results for input(s): "LIPASE", "AMYLASE" in the last 168 hours. Recent Labs  Lab 01/07/23 2049  AMMONIA 46*   CBC: Recent Labs  Lab 01/07/23 1001 01/07/23 1223 01/07/23 1435 01/07/23 1950  01/29/2023 0431 01/16/2023 0721  HGB 7.5* 7.5* 6.1* 7.1* 5.8*  --   HCT 22.0* 22.0* 18.0* 21.0* 17.0*  --  PLT  --   --   --   --   --  34*   Cardiac Enzymes: No results for input(s): "CKTOTAL", "CKMB", "CKMBINDEX", "TROPONINI" in the last 168 hours. Sepsis Labs: No results for input(s): "PROCALCITON", "WBC", "LATICACIDVEN" in the last 168 hours.  Procedures/Operations  CARDIOVASCULAR SURGERY OPERATIVE NOTE   01/22/2023 Keiosha Cancro [id number]   Surgeon:  Ashley Akin, MD   First Assistant: [facility]                               An experienced assistant was required given the complexity of this surgery and the standard of surgical care. The assistant was needed for exposure, dissection, suctioning, retraction of delicate tissues and sutures, instrument exchange and for overall help during this procedure.       Preoperative Diagnosis:  Left main coronary artery disease   Postoperative Diagnosis:  Same     Procedure: Emergent Coronary artery bypass x 3 with LIMA to LAD and RSVG from aorta to PDA and from aorta to OM   Jenny Reichmann, PA-C 01/12/2023, 3:35 PM

## 2023-02-01 DEATH — deceased

## 2023-02-03 ENCOUNTER — Encounter: Payer: Self-pay | Admitting: Cardiology
